# Patient Record
Sex: Male | Born: 1950 | Race: White | Hispanic: No | Marital: Married | Smoking: Former smoker
Health system: Southern US, Community
[De-identification: ages and names within clinical notes are randomized; demographics above are authoritative.]

## PROBLEM LIST (undated history)

## (undated) ENCOUNTER — Inpatient Hospital Stay: Admission: EM | Payer: Self-pay | Source: Home / Self Care

## (undated) DIAGNOSIS — G4700 Insomnia, unspecified: Secondary | ICD-10-CM

## (undated) DIAGNOSIS — I219 Acute myocardial infarction, unspecified: Secondary | ICD-10-CM

## (undated) DIAGNOSIS — Z8489 Family history of other specified conditions: Secondary | ICD-10-CM

## (undated) DIAGNOSIS — D61818 Other pancytopenia: Secondary | ICD-10-CM

## (undated) DIAGNOSIS — Z923 Personal history of irradiation: Secondary | ICD-10-CM

## (undated) DIAGNOSIS — N1330 Unspecified hydronephrosis: Secondary | ICD-10-CM

## (undated) DIAGNOSIS — S42009A Fracture of unspecified part of unspecified clavicle, initial encounter for closed fracture: Secondary | ICD-10-CM

## (undated) DIAGNOSIS — D649 Anemia, unspecified: Secondary | ICD-10-CM

## (undated) DIAGNOSIS — G4730 Sleep apnea, unspecified: Secondary | ICD-10-CM

## (undated) DIAGNOSIS — R011 Cardiac murmur, unspecified: Secondary | ICD-10-CM

## (undated) DIAGNOSIS — K219 Gastro-esophageal reflux disease without esophagitis: Secondary | ICD-10-CM

## (undated) DIAGNOSIS — C3490 Malignant neoplasm of unspecified part of unspecified bronchus or lung: Secondary | ICD-10-CM

## (undated) DIAGNOSIS — C801 Malignant (primary) neoplasm, unspecified: Secondary | ICD-10-CM

## (undated) DIAGNOSIS — Z960 Presence of urogenital implants: Secondary | ICD-10-CM

## (undated) DIAGNOSIS — E119 Type 2 diabetes mellitus without complications: Secondary | ICD-10-CM

## (undated) DIAGNOSIS — E538 Deficiency of other specified B group vitamins: Secondary | ICD-10-CM

## (undated) DIAGNOSIS — IMO0001 Reserved for inherently not codable concepts without codable children: Secondary | ICD-10-CM

## (undated) DIAGNOSIS — C7801 Secondary malignant neoplasm of right lung: Secondary | ICD-10-CM

## (undated) DIAGNOSIS — I2510 Atherosclerotic heart disease of native coronary artery without angina pectoris: Secondary | ICD-10-CM

## (undated) DIAGNOSIS — I10 Essential (primary) hypertension: Secondary | ICD-10-CM

## (undated) DIAGNOSIS — R310 Gross hematuria: Secondary | ICD-10-CM

## (undated) DIAGNOSIS — N200 Calculus of kidney: Secondary | ICD-10-CM

## (undated) DIAGNOSIS — C7800 Secondary malignant neoplasm of unspecified lung: Secondary | ICD-10-CM

## (undated) DIAGNOSIS — D62 Acute posthemorrhagic anemia: Secondary | ICD-10-CM

## (undated) DIAGNOSIS — Z828 Family history of other disabilities and chronic diseases leading to disablement, not elsewhere classified: Secondary | ICD-10-CM

## (undated) DIAGNOSIS — C20 Malignant neoplasm of rectum: Secondary | ICD-10-CM

## (undated) DIAGNOSIS — K625 Hemorrhage of anus and rectum: Secondary | ICD-10-CM

## (undated) HISTORY — DX: Family history of other disabilities and chronic diseases leading to disablement, not elsewhere classified: Z82.8

## (undated) HISTORY — DX: Anemia, unspecified: D64.9

## (undated) HISTORY — DX: Malignant neoplasm of unspecified part of unspecified bronchus or lung: C34.90

## (undated) HISTORY — PX: CHOLECYSTECTOMY: SHX55

## (undated) HISTORY — DX: Secondary malignant neoplasm of right lung: C78.01

## (undated) HISTORY — DX: Secondary malignant neoplasm of unspecified lung: C78.00

## (undated) HISTORY — DX: Presence of urogenital implants: Z96.0

## (undated) HISTORY — DX: Atherosclerotic heart disease of native coronary artery without angina pectoris: I25.10

## (undated) HISTORY — DX: Insomnia, unspecified: G47.00

## (undated) HISTORY — DX: Family history of other specified conditions: Z84.89

## (undated) HISTORY — DX: Personal history of irradiation: Z92.3

## (undated) HISTORY — DX: Deficiency of other specified B group vitamins: E53.8

## (undated) HISTORY — DX: Fracture of unspecified part of unspecified clavicle, initial encounter for closed fracture: S42.009A

## (undated) HISTORY — DX: Essential (primary) hypertension: I10

## (undated) HISTORY — DX: Calculus of kidney: N20.0

---

## 2000-05-30 HISTORY — PX: LITHOTRIPSY: SUR834

## 2001-05-30 HISTORY — PX: CORONARY ANGIOPLASTY WITH STENT PLACEMENT: SHX49

## 2001-07-19 ENCOUNTER — Inpatient Hospital Stay (HOSPITAL_COMMUNITY): Admission: AD | Admit: 2001-07-19 | Discharge: 2001-07-20 | Payer: Self-pay | Admitting: Cardiovascular Disease

## 2001-07-19 ENCOUNTER — Encounter: Payer: Self-pay | Admitting: Emergency Medicine

## 2002-02-07 ENCOUNTER — Encounter: Payer: Self-pay | Admitting: Nephrology

## 2002-02-07 ENCOUNTER — Ambulatory Visit (HOSPITAL_COMMUNITY): Admission: RE | Admit: 2002-02-07 | Discharge: 2002-02-07 | Payer: Self-pay | Admitting: Nephrology

## 2002-02-12 ENCOUNTER — Encounter: Payer: Self-pay | Admitting: Urology

## 2002-02-12 ENCOUNTER — Ambulatory Visit (HOSPITAL_COMMUNITY): Admission: RE | Admit: 2002-02-12 | Discharge: 2002-02-12 | Payer: Self-pay | Admitting: Urology

## 2002-02-18 ENCOUNTER — Ambulatory Visit (HOSPITAL_COMMUNITY): Admission: RE | Admit: 2002-02-18 | Discharge: 2002-02-18 | Payer: Self-pay | Admitting: Urology

## 2002-02-18 ENCOUNTER — Encounter: Payer: Self-pay | Admitting: Urology

## 2002-02-27 ENCOUNTER — Ambulatory Visit (HOSPITAL_COMMUNITY): Admission: RE | Admit: 2002-02-27 | Discharge: 2002-02-27 | Payer: Self-pay | Admitting: Urology

## 2002-02-27 ENCOUNTER — Encounter: Payer: Self-pay | Admitting: Urology

## 2002-03-01 ENCOUNTER — Encounter: Payer: Self-pay | Admitting: Urology

## 2002-03-01 ENCOUNTER — Ambulatory Visit (HOSPITAL_COMMUNITY): Admission: RE | Admit: 2002-03-01 | Discharge: 2002-03-01 | Payer: Self-pay | Admitting: Urology

## 2002-05-16 ENCOUNTER — Ambulatory Visit (HOSPITAL_COMMUNITY): Admission: RE | Admit: 2002-05-16 | Discharge: 2002-05-16 | Payer: Self-pay | Admitting: Urology

## 2002-05-16 ENCOUNTER — Encounter: Payer: Self-pay | Admitting: Urology

## 2003-10-14 ENCOUNTER — Ambulatory Visit (HOSPITAL_COMMUNITY): Admission: RE | Admit: 2003-10-14 | Discharge: 2003-10-14 | Payer: Self-pay | Admitting: Internal Medicine

## 2003-10-24 ENCOUNTER — Ambulatory Visit (HOSPITAL_COMMUNITY): Admission: RE | Admit: 2003-10-24 | Discharge: 2003-10-24 | Payer: Self-pay | Admitting: Urology

## 2003-12-03 ENCOUNTER — Ambulatory Visit (HOSPITAL_COMMUNITY): Admission: RE | Admit: 2003-12-03 | Discharge: 2003-12-03 | Payer: Self-pay | Admitting: Urology

## 2003-12-11 ENCOUNTER — Ambulatory Visit (HOSPITAL_COMMUNITY): Admission: RE | Admit: 2003-12-11 | Discharge: 2003-12-11 | Payer: Self-pay | Admitting: Urology

## 2004-01-09 ENCOUNTER — Ambulatory Visit (HOSPITAL_COMMUNITY): Admission: RE | Admit: 2004-01-09 | Discharge: 2004-01-09 | Payer: Self-pay | Admitting: Urology

## 2005-05-30 HISTORY — PX: CATARACT EXTRACTION W/ INTRAOCULAR LENS IMPLANT: SHX1309

## 2006-04-07 ENCOUNTER — Ambulatory Visit: Payer: Self-pay | Admitting: Internal Medicine

## 2006-04-11 ENCOUNTER — Ambulatory Visit (HOSPITAL_COMMUNITY): Admission: RE | Admit: 2006-04-11 | Discharge: 2006-04-11 | Payer: Self-pay | Admitting: Ophthalmology

## 2006-04-25 ENCOUNTER — Ambulatory Visit (HOSPITAL_COMMUNITY): Admission: RE | Admit: 2006-04-25 | Discharge: 2006-04-25 | Payer: Self-pay | Admitting: Ophthalmology

## 2006-05-18 ENCOUNTER — Encounter (INDEPENDENT_AMBULATORY_CARE_PROVIDER_SITE_OTHER): Payer: Self-pay | Admitting: Specialist

## 2006-05-18 ENCOUNTER — Ambulatory Visit (HOSPITAL_COMMUNITY): Admission: RE | Admit: 2006-05-18 | Discharge: 2006-05-18 | Payer: Self-pay | Admitting: General Surgery

## 2006-05-30 HISTORY — PX: COLON RESECTION: SHX5231

## 2007-03-14 ENCOUNTER — Ambulatory Visit: Payer: Self-pay | Admitting: Internal Medicine

## 2007-03-14 ENCOUNTER — Encounter (HOSPITAL_COMMUNITY): Admission: RE | Admit: 2007-03-14 | Discharge: 2007-04-13 | Payer: Self-pay | Admitting: Oncology

## 2007-03-14 ENCOUNTER — Encounter: Payer: Self-pay | Admitting: Internal Medicine

## 2007-03-14 ENCOUNTER — Ambulatory Visit (HOSPITAL_COMMUNITY): Payer: Self-pay | Admitting: Oncology

## 2007-03-14 ENCOUNTER — Ambulatory Visit (HOSPITAL_COMMUNITY): Admission: RE | Admit: 2007-03-14 | Discharge: 2007-03-14 | Payer: Self-pay | Admitting: Internal Medicine

## 2007-03-15 ENCOUNTER — Encounter (HOSPITAL_COMMUNITY): Admission: RE | Admit: 2007-03-15 | Discharge: 2007-04-14 | Payer: Self-pay | Admitting: Internal Medicine

## 2007-03-16 ENCOUNTER — Encounter: Payer: Self-pay | Admitting: Internal Medicine

## 2007-03-16 ENCOUNTER — Ambulatory Visit: Payer: Self-pay | Admitting: Internal Medicine

## 2007-03-16 ENCOUNTER — Ambulatory Visit (HOSPITAL_COMMUNITY): Admission: RE | Admit: 2007-03-16 | Discharge: 2007-03-16 | Payer: Self-pay | Admitting: Internal Medicine

## 2007-03-19 ENCOUNTER — Ambulatory Visit (HOSPITAL_COMMUNITY): Admission: RE | Admit: 2007-03-19 | Discharge: 2007-03-19 | Payer: Self-pay | Admitting: Internal Medicine

## 2007-03-29 ENCOUNTER — Ambulatory Visit: Payer: Self-pay | Admitting: Cardiology

## 2007-04-13 ENCOUNTER — Encounter (INDEPENDENT_AMBULATORY_CARE_PROVIDER_SITE_OTHER): Payer: Self-pay | Admitting: General Surgery

## 2007-04-13 ENCOUNTER — Inpatient Hospital Stay (HOSPITAL_COMMUNITY): Admission: RE | Admit: 2007-04-13 | Discharge: 2007-05-02 | Payer: Self-pay | Admitting: General Surgery

## 2007-04-22 ENCOUNTER — Encounter (INDEPENDENT_AMBULATORY_CARE_PROVIDER_SITE_OTHER): Payer: Self-pay | Admitting: General Surgery

## 2007-05-10 ENCOUNTER — Ambulatory Visit (HOSPITAL_COMMUNITY): Admission: RE | Admit: 2007-05-10 | Discharge: 2007-05-10 | Payer: Self-pay | Admitting: General Surgery

## 2007-05-29 ENCOUNTER — Ambulatory Visit (HOSPITAL_COMMUNITY): Payer: Self-pay | Admitting: Oncology

## 2007-05-29 ENCOUNTER — Encounter (HOSPITAL_COMMUNITY): Admission: RE | Admit: 2007-05-29 | Discharge: 2007-05-30 | Payer: Self-pay | Admitting: Oncology

## 2007-06-06 ENCOUNTER — Ambulatory Visit (HOSPITAL_COMMUNITY): Admission: RE | Admit: 2007-06-06 | Discharge: 2007-06-06 | Payer: Self-pay | Admitting: General Surgery

## 2007-06-08 ENCOUNTER — Ambulatory Visit: Admission: RE | Admit: 2007-06-08 | Discharge: 2007-08-23 | Payer: Self-pay | Admitting: Radiation Oncology

## 2007-06-26 ENCOUNTER — Encounter (HOSPITAL_COMMUNITY): Admission: RE | Admit: 2007-06-26 | Discharge: 2007-07-26 | Payer: Self-pay | Admitting: Oncology

## 2007-07-24 ENCOUNTER — Ambulatory Visit (HOSPITAL_COMMUNITY): Payer: Self-pay | Admitting: Oncology

## 2007-08-22 ENCOUNTER — Encounter (HOSPITAL_COMMUNITY): Admission: RE | Admit: 2007-08-22 | Discharge: 2007-09-21 | Payer: Self-pay | Admitting: Oncology

## 2007-08-22 ENCOUNTER — Ambulatory Visit (HOSPITAL_COMMUNITY): Payer: Self-pay | Admitting: Oncology

## 2007-09-28 ENCOUNTER — Encounter (HOSPITAL_COMMUNITY): Admission: RE | Admit: 2007-09-28 | Discharge: 2007-10-28 | Payer: Self-pay | Admitting: Oncology

## 2007-10-08 ENCOUNTER — Ambulatory Visit (HOSPITAL_COMMUNITY): Payer: Self-pay | Admitting: Oncology

## 2007-11-01 ENCOUNTER — Encounter (HOSPITAL_COMMUNITY): Admission: RE | Admit: 2007-11-01 | Discharge: 2007-12-01 | Payer: Self-pay | Admitting: Oncology

## 2007-11-26 ENCOUNTER — Ambulatory Visit (HOSPITAL_COMMUNITY): Payer: Self-pay | Admitting: Oncology

## 2007-12-24 ENCOUNTER — Encounter (HOSPITAL_COMMUNITY): Admission: RE | Admit: 2007-12-24 | Discharge: 2008-01-23 | Payer: Self-pay | Admitting: Oncology

## 2008-01-14 ENCOUNTER — Ambulatory Visit (HOSPITAL_COMMUNITY): Payer: Self-pay | Admitting: Oncology

## 2008-02-22 ENCOUNTER — Encounter (HOSPITAL_COMMUNITY): Admission: RE | Admit: 2008-02-22 | Discharge: 2008-03-23 | Payer: Self-pay | Admitting: Oncology

## 2008-03-04 ENCOUNTER — Ambulatory Visit: Payer: Self-pay | Admitting: Gastroenterology

## 2008-03-06 ENCOUNTER — Ambulatory Visit (HOSPITAL_COMMUNITY): Admission: RE | Admit: 2008-03-06 | Discharge: 2008-03-06 | Payer: Self-pay | Admitting: Internal Medicine

## 2008-03-13 ENCOUNTER — Ambulatory Visit (HOSPITAL_COMMUNITY): Admission: RE | Admit: 2008-03-13 | Discharge: 2008-03-13 | Payer: Self-pay | Admitting: Internal Medicine

## 2008-03-25 ENCOUNTER — Ambulatory Visit (HOSPITAL_COMMUNITY): Admission: RE | Admit: 2008-03-25 | Discharge: 2008-03-25 | Payer: Self-pay | Admitting: Internal Medicine

## 2008-03-25 ENCOUNTER — Encounter: Payer: Self-pay | Admitting: Internal Medicine

## 2008-03-25 ENCOUNTER — Ambulatory Visit: Payer: Self-pay | Admitting: Internal Medicine

## 2008-03-28 ENCOUNTER — Ambulatory Visit (HOSPITAL_COMMUNITY): Payer: Self-pay | Admitting: Oncology

## 2008-05-09 ENCOUNTER — Encounter (HOSPITAL_COMMUNITY): Admission: RE | Admit: 2008-05-09 | Discharge: 2008-06-08 | Payer: Self-pay | Admitting: Oncology

## 2008-06-20 ENCOUNTER — Ambulatory Visit (HOSPITAL_COMMUNITY): Payer: Self-pay | Admitting: Oncology

## 2008-07-25 ENCOUNTER — Encounter (HOSPITAL_COMMUNITY): Admission: RE | Admit: 2008-07-25 | Discharge: 2008-08-24 | Payer: Self-pay | Admitting: Oncology

## 2008-08-22 ENCOUNTER — Ambulatory Visit (HOSPITAL_COMMUNITY): Payer: Self-pay | Admitting: Oncology

## 2008-08-27 ENCOUNTER — Encounter: Payer: Self-pay | Admitting: Internal Medicine

## 2008-09-03 ENCOUNTER — Encounter (HOSPITAL_COMMUNITY): Admission: RE | Admit: 2008-09-03 | Discharge: 2008-10-03 | Payer: Self-pay | Admitting: Oncology

## 2008-10-15 ENCOUNTER — Ambulatory Visit (HOSPITAL_COMMUNITY): Payer: Self-pay | Admitting: Oncology

## 2008-11-17 ENCOUNTER — Encounter (HOSPITAL_COMMUNITY): Admission: RE | Admit: 2008-11-17 | Discharge: 2008-12-17 | Payer: Self-pay | Admitting: Oncology

## 2008-12-15 ENCOUNTER — Ambulatory Visit (HOSPITAL_COMMUNITY): Payer: Self-pay | Admitting: Oncology

## 2009-02-03 ENCOUNTER — Encounter (HOSPITAL_COMMUNITY): Admission: RE | Admit: 2009-02-03 | Discharge: 2009-02-25 | Payer: Self-pay | Admitting: Oncology

## 2009-02-03 ENCOUNTER — Ambulatory Visit (HOSPITAL_COMMUNITY): Payer: Self-pay | Admitting: Oncology

## 2009-02-18 ENCOUNTER — Encounter: Payer: Self-pay | Admitting: Internal Medicine

## 2009-03-31 ENCOUNTER — Ambulatory Visit (HOSPITAL_COMMUNITY): Payer: Self-pay | Admitting: Oncology

## 2009-05-11 ENCOUNTER — Encounter (HOSPITAL_COMMUNITY): Admission: RE | Admit: 2009-05-11 | Discharge: 2009-05-27 | Payer: Self-pay | Admitting: Oncology

## 2009-06-08 ENCOUNTER — Ambulatory Visit (HOSPITAL_COMMUNITY): Payer: Self-pay | Admitting: Oncology

## 2009-08-03 ENCOUNTER — Ambulatory Visit (HOSPITAL_COMMUNITY): Payer: Self-pay | Admitting: Oncology

## 2009-08-03 ENCOUNTER — Encounter (HOSPITAL_COMMUNITY): Admission: RE | Admit: 2009-08-03 | Discharge: 2009-09-02 | Payer: Self-pay | Admitting: Oncology

## 2009-08-18 ENCOUNTER — Encounter: Payer: Self-pay | Admitting: Internal Medicine

## 2009-09-28 ENCOUNTER — Ambulatory Visit (HOSPITAL_COMMUNITY): Payer: Self-pay | Admitting: Oncology

## 2009-11-02 ENCOUNTER — Encounter (HOSPITAL_COMMUNITY): Admission: RE | Admit: 2009-11-02 | Discharge: 2009-12-02 | Payer: Self-pay | Admitting: Oncology

## 2009-12-07 ENCOUNTER — Ambulatory Visit (HOSPITAL_COMMUNITY): Payer: Self-pay | Admitting: Oncology

## 2010-01-08 ENCOUNTER — Encounter (HOSPITAL_COMMUNITY): Admission: RE | Admit: 2010-01-08 | Discharge: 2010-02-07 | Payer: Self-pay | Admitting: Oncology

## 2010-02-02 ENCOUNTER — Ambulatory Visit (HOSPITAL_COMMUNITY): Payer: Self-pay | Admitting: Oncology

## 2010-02-10 ENCOUNTER — Encounter: Payer: Self-pay | Admitting: Internal Medicine

## 2010-03-29 ENCOUNTER — Encounter (HOSPITAL_COMMUNITY)
Admission: RE | Admit: 2010-03-29 | Discharge: 2010-04-28 | Payer: Self-pay | Source: Home / Self Care | Admitting: Oncology

## 2010-03-29 ENCOUNTER — Ambulatory Visit (HOSPITAL_COMMUNITY): Payer: Self-pay | Admitting: Oncology

## 2010-05-03 ENCOUNTER — Encounter (HOSPITAL_COMMUNITY)
Admission: RE | Admit: 2010-05-03 | Discharge: 2010-06-02 | Payer: Self-pay | Source: Home / Self Care | Attending: Oncology | Admitting: Oncology

## 2010-06-14 ENCOUNTER — Ambulatory Visit (HOSPITAL_COMMUNITY)
Admission: RE | Admit: 2010-06-14 | Discharge: 2010-06-29 | Payer: Self-pay | Source: Home / Self Care | Attending: Oncology | Admitting: Oncology

## 2010-06-14 ENCOUNTER — Encounter (HOSPITAL_COMMUNITY)
Admission: RE | Admit: 2010-06-14 | Discharge: 2010-06-29 | Payer: Self-pay | Source: Home / Self Care | Attending: Oncology | Admitting: Oncology

## 2010-06-16 LAB — CBC
HCT: 38.7 % — ABNORMAL LOW (ref 39.0–52.0)
Hemoglobin: 13.1 g/dL (ref 13.0–17.0)
MCH: 28.9 pg (ref 26.0–34.0)
MCHC: 33.9 g/dL (ref 30.0–36.0)
MCV: 85.4 fL (ref 78.0–100.0)
Platelets: 80 10*3/uL — ABNORMAL LOW (ref 150–400)
RBC: 4.53 MIL/uL (ref 4.22–5.81)
RDW: 16.1 % — ABNORMAL HIGH (ref 11.5–15.5)
WBC: 4.6 10*3/uL (ref 4.0–10.5)

## 2010-06-16 LAB — DIFFERENTIAL
Basophils Absolute: 0 10*3/uL (ref 0.0–0.1)
Basophils Relative: 0 % (ref 0–1)
Eosinophils Absolute: 0.1 10*3/uL (ref 0.0–0.7)
Eosinophils Relative: 2 % (ref 0–5)
Lymphocytes Relative: 12 % (ref 12–46)
Lymphs Abs: 0.6 10*3/uL — ABNORMAL LOW (ref 0.7–4.0)
Monocytes Absolute: 0.3 10*3/uL (ref 0.1–1.0)
Monocytes Relative: 7 % (ref 3–12)
Neutro Abs: 3.6 10*3/uL (ref 1.7–7.7)
Neutrophils Relative %: 79 % — ABNORMAL HIGH (ref 43–77)

## 2010-06-16 LAB — COMPREHENSIVE METABOLIC PANEL
ALT: 18 U/L (ref 0–53)
AST: 23 U/L (ref 0–37)
Albumin: 3.8 g/dL (ref 3.5–5.2)
Alkaline Phosphatase: 60 U/L (ref 39–117)
BUN: 9 mg/dL (ref 6–23)
CO2: 28 mEq/L (ref 19–32)
Calcium: 9.4 mg/dL (ref 8.4–10.5)
Chloride: 103 mEq/L (ref 96–112)
Creatinine, Ser: 1.36 mg/dL (ref 0.4–1.5)
GFR calc Af Amer: >60 mL/min (ref >60)
GFR calc non Af Amer: 54 mL/min — ABNORMAL LOW (ref >60)
Glucose, Bld: 113 mg/dL — ABNORMAL HIGH (ref 70–99)
Potassium: 3.8 mEq/L (ref 3.5–5.1)
Sodium: 140 mEq/L (ref 135–145)
Total Bilirubin: 0.6 mg/dL (ref 0.3–1.2)
Total Protein: 7.2 g/dL (ref 6.0–8.3)

## 2010-06-16 LAB — CEA: CEA: 1.9 ng/mL (ref 0.0–5.0)

## 2010-06-20 ENCOUNTER — Encounter: Payer: Self-pay | Admitting: General Surgery

## 2010-06-29 NOTE — Letter (Signed)
Summary: [facility]  [facility]   Imported By: Rexene Alberts 02/10/2010 16:03:41  _____________________________________________________________________  External Attachment:    Type:   Image     Comment:   External Document

## 2010-06-29 NOTE — Letter (Signed)
Summary: [facility]  [facility]   Imported By: Diana Eves 08/18/2009 10:41:03  _____________________________________________________________________  External Attachment:    Type:   Image     Comment:   External Document

## 2010-07-07 DIAGNOSIS — E7800 Pure hypercholesterolemia, unspecified: Secondary | ICD-10-CM | POA: Insufficient documentation

## 2010-07-07 DIAGNOSIS — C20 Malignant neoplasm of rectum: Secondary | ICD-10-CM

## 2010-07-07 DIAGNOSIS — E119 Type 2 diabetes mellitus without complications: Secondary | ICD-10-CM | POA: Insufficient documentation

## 2010-07-07 DIAGNOSIS — I2510 Atherosclerotic heart disease of native coronary artery without angina pectoris: Secondary | ICD-10-CM | POA: Insufficient documentation

## 2010-07-07 DIAGNOSIS — G4730 Sleep apnea, unspecified: Secondary | ICD-10-CM | POA: Insufficient documentation

## 2010-07-07 DIAGNOSIS — D539 Nutritional anemia, unspecified: Secondary | ICD-10-CM | POA: Insufficient documentation

## 2010-07-07 HISTORY — DX: Malignant neoplasm of rectum: C20

## 2010-07-12 ENCOUNTER — Encounter: Payer: Self-pay | Admitting: Urgent Care

## 2010-07-12 ENCOUNTER — Other Ambulatory Visit: Payer: Self-pay | Admitting: Internal Medicine

## 2010-07-12 ENCOUNTER — Other Ambulatory Visit (HOSPITAL_COMMUNITY): Payer: Managed Care, Other (non HMO)

## 2010-07-12 ENCOUNTER — Ambulatory Visit (INDEPENDENT_AMBULATORY_CARE_PROVIDER_SITE_OTHER): Payer: Managed Care, Other (non HMO) | Admitting: Urgent Care

## 2010-07-12 DIAGNOSIS — E538 Deficiency of other specified B group vitamins: Secondary | ICD-10-CM

## 2010-07-12 DIAGNOSIS — C189 Malignant neoplasm of colon, unspecified: Secondary | ICD-10-CM

## 2010-07-12 DIAGNOSIS — R112 Nausea with vomiting, unspecified: Secondary | ICD-10-CM | POA: Insufficient documentation

## 2010-07-12 DIAGNOSIS — R109 Unspecified abdominal pain: Secondary | ICD-10-CM

## 2010-07-12 DIAGNOSIS — R197 Diarrhea, unspecified: Secondary | ICD-10-CM | POA: Insufficient documentation

## 2010-07-12 DIAGNOSIS — C20 Malignant neoplasm of rectum: Secondary | ICD-10-CM

## 2010-07-12 LAB — CONVERTED CEMR LAB: TSH: 1.262 microintl units/mL (ref 0.350–4.500)

## 2010-07-13 ENCOUNTER — Ambulatory Visit (HOSPITAL_COMMUNITY)
Admission: RE | Admit: 2010-07-13 | Discharge: 2010-07-13 | Disposition: A | Discharge status: Home / Self Care | Payer: Managed Care, Other (non HMO) | Source: Ambulatory Visit | Attending: Internal Medicine | Admitting: Internal Medicine

## 2010-07-13 ENCOUNTER — Encounter (HOSPITAL_COMMUNITY): Payer: Self-pay

## 2010-07-13 DIAGNOSIS — Q619 Cystic kidney disease, unspecified: Secondary | ICD-10-CM | POA: Insufficient documentation

## 2010-07-13 DIAGNOSIS — R933 Abnormal findings on diagnostic imaging of other parts of digestive tract: Secondary | ICD-10-CM | POA: Insufficient documentation

## 2010-07-13 DIAGNOSIS — Z85038 Personal history of other malignant neoplasm of large intestine: Secondary | ICD-10-CM | POA: Insufficient documentation

## 2010-07-13 DIAGNOSIS — N200 Calculus of kidney: Secondary | ICD-10-CM | POA: Insufficient documentation

## 2010-07-13 DIAGNOSIS — R109 Unspecified abdominal pain: Secondary | ICD-10-CM | POA: Insufficient documentation

## 2010-07-13 DIAGNOSIS — Z9049 Acquired absence of other specified parts of digestive tract: Secondary | ICD-10-CM | POA: Insufficient documentation

## 2010-07-13 DIAGNOSIS — R197 Diarrhea, unspecified: Secondary | ICD-10-CM | POA: Insufficient documentation

## 2010-07-13 HISTORY — DX: Malignant (primary) neoplasm, unspecified: C80.1

## 2010-07-13 LAB — CONVERTED CEMR LAB
ALT: 37 units/L (ref 0–53)
AST: 30 units/L (ref 0–37)
Albumin: 4 g/dL (ref 3.5–5.2)
Basophils Absolute: 0 10*3/uL (ref 0.0–0.1)
Basophils Relative: 0 % (ref 0–1)
Chloride: 105 meq/L (ref 96–112)
Eosinophils Absolute: 0.1 10*3/uL (ref 0.0–0.7)
Eosinophils Relative: 1 % (ref 0–5)
HCT: 42.5 % (ref 39.0–52.0)
Lymphs Abs: 0.8 10*3/uL (ref 0.7–4.0)
MCV: 86.4 fL (ref 78.0–100.0)
Monocytes Relative: 7 % (ref 3–12)
Neutrophils Relative %: 82 % — ABNORMAL HIGH (ref 43–77)
Platelets: 141 10*3/uL — ABNORMAL LOW (ref 150–400)
RBC: 4.92 M/uL (ref 4.22–5.81)
Total Bilirubin: 1 mg/dL (ref 0.3–1.2)
Total Protein: 7.2 g/dL (ref 6.0–8.3)

## 2010-07-15 ENCOUNTER — Encounter (INDEPENDENT_AMBULATORY_CARE_PROVIDER_SITE_OTHER): Payer: Self-pay

## 2010-07-15 ENCOUNTER — Telehealth (INDEPENDENT_AMBULATORY_CARE_PROVIDER_SITE_OTHER): Payer: Self-pay

## 2010-07-15 ENCOUNTER — Encounter: Payer: Self-pay | Admitting: Internal Medicine

## 2010-07-20 ENCOUNTER — Encounter: Payer: Self-pay | Admitting: Internal Medicine

## 2010-07-21 NOTE — Miscellaneous (Signed)
Summary: Orders Update  Clinical Lists Changes  Orders: Added new Test order of T-Culture, C-Diff Toxin A/B [phone]) - Signed Added new Test order of T-Culture, Stool ([id number]) - Signed Added new Test order of T-Fecal WBC ([id number]) - Signed

## 2010-07-21 NOTE — Assessment & Plan Note (Signed)
Summary: stomach pains,diarrhea   Vital Signs:  Patient profile:   60 year old male Height:      70 inches Weight:      222 pounds BMI:     31.97 Temp:     98.0 degrees F oral Pulse rate:   72 / minute BP sitting:   90 / 68  (left arm) Cuff size:   regular  Vitals Entered By: Cloria Spring LPN (July 12, 2010 11:33 AM)  Visit Type:  Follow-up Visit Primary Care Provider:  Dr. Phillips Odor  Chief Complaint:  abd pain/diarrhea.  History of Present Illness: cc: Dr [name] (oncologist)  60 y/o caucasian male w/ diarrhea worse x 1 mo.  Persistent x at least 3 months.  Hx rectal CA s/p low ant resection & subtotal colectomy back in 03/2007. s/p radiation & chemo.   Was doing very well.  Weight stable.  Appetite ok.  c/o loose "like dish water" stools q x 8-10 per day.  c/o lots of gurgling.  c/o nausea & vomiting on 2 occasions, one was Thanksgiving, esp after he eats a lot.  Feels better w/ vomiting.  c/o intermittant mid-abd "knot-like pain" that originates in RUQ, lasts minutes.    Denies heartburn, indigestion, dysphagia, or odynophagia.  Denies rectal bleeding or melena.  Denies weight loss or anorexia.   Denies fever or chills.       Current Medications (verified): 1)  Aspir-Low 81 Mg Tbec (Aspirin) 2)  Metoprolol-Hydrochlorothiazide 50-25 Mg Tabs (Metoprolol-Hydrochlorothiazide) .... One Tablet Daily 3)  Daily Multiple Vitamins  Tabs (Multiple Vitamin) .... One Tablet Daily 4)  Lisinopril 10 Mg Tabs (Lisinopril) .... One Tablet Daily 5)  Simvastatin 40 Mg Tabs (Simvastatin) .... 1/2 Tablet Daily 6)  B12 Injection .... Once Monthly  Allergies (verified): 1)  ! 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  Illicit Drug Use - no Smoking Status:  quit Drug Use:  no  Review of Systems      See HPI General:  Denies fever, chills, sweats, anorexia, fatigue, weakness, malaise, weight loss, and sleep disorder. CV:  Denies chest pains, angina, palpitations, syncope, dyspnea on exertion, orthopnea, PND, peripheral edema, and claudication. Resp:  Denies dyspnea at rest, dyspnea with exercise, cough, sputum, wheezing, coughing up blood, and pleurisy. GI:  See HPI; Denies difficulty swallowing, pain on swallowing, and jaundice. GU:  Denies urinary burning, blood in urine, urinary frequency, urinary hesitancy, nocturnal urination, and urinary incontinence. MS:  Denies joint pain / LOM, joint swelling, joint stiffness, joint deformity, low back pain, muscle weakness, muscle cramps, muscle atrophy, leg pain at night, leg pain with exertion, and shoulder pain / LOM hand / wrist pain (CTS). Derm:  Denies rash, itching, dry skin, hives, moles, warts, and unhealing ulcers. Psych:  Denies depression, anxiety, memory loss, suicidal ideation, hallucinations, paranoia, phobia, and confusion. Heme:  Denies bruising, bleeding, and enlarged lymph nodes.  Physical Exam  General:  Well developed, well nourished, no acute distress. Head:  Normocephalic and atraumatic. Eyes:  Sclera clear, no  icterus. Ears:  Normal auditory acuity. Mouth:  No deformity or lesions, dentition normal. Neck:  Supple; no masses or thyromegaly. Chest Wall:  Left ant PAC site clear Lungs:  Clear throughout to auscultation. Heart:  Regular rate and rhythm; no murmurs, rubs,  or bruits. Abdomen:  Soft, nontender and nondistended. No masses,  hepatosplenomegaly or hernias noted. Normal bowel sounds.without guarding and without rebound.   Msk:  Symmetrical with no gross deformities. Normal posture. Pulses:  Normal pulses noted. Extremities:  No clubbing, cyanosis, edema or deformities noted. Neurologic:  Alert and  oriented x4;  grossly normal neurologically. Skin:  Intact without significant lesions or rashes. Cervical Nodes:  No significant cervical adenopathy. Psych:  Alert and cooperative. Normal mood and affect.   Impression & Recommendations:  Problem # 1:  DIARRHEA (ICD-65.91) 60 y/o caucasian male w/ hx rectal CA who presents w/ abd pain, diarrhea, N/V x 12mo, worse over past month.  Differentials include recurrent colorectal CA, intermittant obstruction/ileus, post-surgical diarrhea, doubt infectious colitis given duration of symptoms.  He warrants further evaluation. CT abd/pelvis w/ IV/oral contrast tomorrow Orders: T-CBC w/Diff [phone]) T-Hepatic Function [phone]) T-Basic Metabolic Panel [phone]) T-TSH [phone]) Est. Patient Level IV (28413)  Problem # 2:  NAUSEA AND VOMITING (ICD-787.01) See #1  Problem # 3:  ABDOMINAL PAIN (ICD-789.00) See #1  Problem # 4:  Hx of CARCINOMA, RECTUM (ICD-154.1) See #1  Patient Instructions: 1)  Go get labs today 2)  Ct tomorrow 3)  To Er if severe pain, N/V/D   Orders Added: 1)  T-CBC w/Diff [[id number]] 2)  T-Hepatic Function [[id number]] 3)  T-Basic Metabolic Panel [[id number]] 4)  T-TSH [[id number]] 5)  Est. Patient Level IV [25956]

## 2010-07-21 NOTE — Miscellaneous (Addendum)
Summary: 03/25/2008 tcs and path report  Clinical Lists Changes NAME:  [name], [name]               ACCOUNT NO.:  [id number]      MEDICAL RECORD NO.:  [id number]          PATIENT TYPE:  AMB      LOCATION:  DAY                           FACILITY:  [facility]      PHYSICIAN:  R. Roetta Sessions, M.D. DATE OF BIRTH:  09/21/1950      DATE OF PROCEDURE:  03/25/2008   DATE OF DISCHARGE:                                  OPERATIVE REPORT     PROCEDURE:  Sigmoidoscopy with biopsy.      INDICATIONS FOR PROCEDURE:  The patient is a pleasant 60 year old   gentleman who underwent low anterior resection for a proximal rectal   cancer last year.  Unfortunately, the initial resection did not include   the rectal tumor.  Postoperative course was complicated by anastomotic   leak for which a second surgery was performed, this removed the rectal   carcinoma.      He had been seen by Dr. Mariel [name].  He has been on chemotherapy, but he   has developed significant thrombocytopenia which was complicated by   chemotherapy.  Dr. Mariel [name] has data quantifying an enlargement of his   spleen over time.  We saw him in consultation recently and performed an   MRI of the abdomen.  Dr. Eppie [name] interpreted his liver and spleen to   appear completely normal without any evidence of metastatic disease.   [name] has really done well clinically from the standpoint of his   surgery.  Certainly, he does have a diarrhea now days and takes Lomotil   and Imodium p.r.n. with fairly good results.  Of note, he does have   cholelithiasis on prior MRI.  He is here for his surveillance   examination of his residual lower GI tract.  This approach has discussed   with the patient at length.  Risks, benefits, and alternatives have been   reviewed.  Please see the documentation in the medical record.      PROCEDURE NOTE:  O2 saturation, blood pressure, pulse of the patient   monitored throughout the entire procedure.  Conscious sedation,  Versed 4   mg IV Demerol 100 g IV in divided doses.      INSTRUMENTATION:  Pentax video chip system.      FINDINGS:  Digital rectal exam revealed no abnormalities.  Endoscopic   findings, the prep was adequate.  Examination of the distal rectum   revealed a surgical anastomosis with small bowel at 12 cm from the anal   verge.      This anastomosis was observed to be fairly close to the location of the   tumor, found last year, suture material was protruding from the   anastomosis.  Anastomosis was patent.  I was able to go up to 15-20 cm   in the small bowel, which appeared normal.  I pulled the scope back to   examine the anastomosis carefully, and the tissue around the anastomosis   was somewhat friable and a little bumpy/ adenomatous appearing  between   two areas of suture which may just be postoperative fibrotic changes.   This area was biopsied for the pathologist.  From the anastomosis   distally, the residual rectal mucosa appeared entirely normal.  The   rectal vault was too small to retroflex, but for the same reason I was   able to see the residual rectal mucosa very well and found that as   stated.  The patient tolerated the procedure well and was reactive in   endoscopy.      IMPRESSION:   1. Status post a colectomy with 12 cm of residual rectum, remaining       surgical anastomosis was identified.  It was patent, distal small       bowel appeared more normal.   2. Subtle mucosal layer irregularity at the anastomosis which may be a       normal variant approximately 1 year postoperatively status post       biopsy.      RECOMMENDATIONS:   1. Continue Lomotil/Imodium p.r.n. diarrhea.   2. Follow-up on path.   3. As far as his liver and spleen are concerned, on MRI, Dr. Eppie [name]       interpreted the organs to be normal in appearance.  We would like       to review this study with him as soon as that can be arranged.  MRI       findings will be reassuring if the spleen has  enlarged.  He could       have had some degree of splenic sequestration to account for his       diminished platelet count.   4. Further recommendations to follow.                  [name], M.D.   Electronically Signed            RMR/MEDQ  D:  03/25/2008  T:  03/25/2008  Job:  [id number]      cc:   [name]. [name] Sleet, [name]   Fax: [phone]      [name], M.D.   Fax: [phone]      SP-Surgical Pathology - STATUS: Final  .                                         Perform Date: 27Oct09 00:01  Ordered By: [name] , [name]           Ordered Date: 27Oct09 13:52  Facility: [facility]                               Department: CPATH  Service Report Text  [facility]   [address] [place], [place] [address]   [phone]    REPORT OF SURGICAL PATHOLOGY    Case #: [id number]   Patient Name: [name], [name].   Office Chart Number: N/A    MRN: [id number]   Pathologist: H. Hollice Espy, [name]   DOB/Age [birth year] (Age: 30) Gender: M   Date Taken: 03/25/2008   Date Received: 03/25/2008    FINAL DIAGNOSIS    ***MICROSCOPIC EXAMINATION AND DIAGNOSIS***    ANASTOMOSIS SITE, BIOPSY: COLONIC AND SMALL BOWEL MUCOSA WITH   ASSOCIATED ACUTE INFLAMMATION AND SURFACE EROSIONS, NO EVIDENCE  OF VIRAL CYTOPATHIC CHANGES, ADENOMATOUS CHANGES, DYSPLASIA, OR   MALIGNANCY IDENTIFIED.    gdt   Date Reported: 03/26/2008 H. Hollice Espy, [name]   *** Electronically Signed Out By [facility] ***    Clinical information   History of colon ca (km)    specimen(s) obtained   Colon, biopsy, anastamosis    Gross Description   Received in formalin are tan, soft tissue fragments that are   submitted in toto. Number: Three   Size: 0.1 to 0.2 cm (SW:mj 03/25/08)    mj/   Additional Information  HL7 RESULT STATUS : F  External IF Update Timestamp : 2008-03-25:13:55:00.000000

## 2010-07-21 NOTE — Progress Notes (Addendum)
Summary: orders for stool samples  ---- Converted from flag ---- ---- 07/14/2010 3:53 PM, R. Roetta Sessions MD, Caleen Essex wrote: when was last tcs/fs? need stool spl for cdiff, lactoferrin and culture asap ------------------------------  Appended Document: Orders Update 02/2008 FS->Status post a colectomy with 12 cm of residual rectum, remaining surgical anastomosis was identified.  It was patent, distal small bowel appeared more normal. Subtle mucosal layer irregularity at the anastomosis which may be a normal variant approximately 1 year postoperatively status post biopsy.  ANASTOMOSIS SITE, BIOPSY: COLONIC AND SMALL BOWEL MUCOSA WITH   ASSOCIATED ACUTE INFLAMMATION AND SURFACE EROSIONS, NO EVIDENCE   OF VIRAL CYTOPATHIC CHANGES, ADENOMATOUS CHANGES, DYSPLASIA, OR   MALIGNANCY IDENTIFIED    Clinical Lists Changes       Appended Document: orders for stool samples see CT report

## 2010-07-27 ENCOUNTER — Other Ambulatory Visit: Payer: Self-pay | Admitting: Internal Medicine

## 2010-07-27 ENCOUNTER — Other Ambulatory Visit: Payer: Managed Care, Other (non HMO) | Admitting: Internal Medicine

## 2010-07-27 ENCOUNTER — Ambulatory Visit (HOSPITAL_COMMUNITY)
Admission: RE | Admit: 2010-07-27 | Discharge: 2010-07-27 | Disposition: A | Discharge status: Home / Self Care | Payer: Managed Care, Other (non HMO) | Source: Ambulatory Visit | Attending: Internal Medicine | Admitting: Internal Medicine

## 2010-07-27 DIAGNOSIS — E785 Hyperlipidemia, unspecified: Secondary | ICD-10-CM | POA: Insufficient documentation

## 2010-07-27 DIAGNOSIS — Z9049 Acquired absence of other specified parts of digestive tract: Secondary | ICD-10-CM | POA: Insufficient documentation

## 2010-07-27 DIAGNOSIS — Z7982 Long term (current) use of aspirin: Secondary | ICD-10-CM | POA: Insufficient documentation

## 2010-07-27 DIAGNOSIS — I10 Essential (primary) hypertension: Secondary | ICD-10-CM | POA: Insufficient documentation

## 2010-07-27 DIAGNOSIS — R197 Diarrhea, unspecified: Secondary | ICD-10-CM | POA: Insufficient documentation

## 2010-07-27 DIAGNOSIS — E119 Type 2 diabetes mellitus without complications: Secondary | ICD-10-CM | POA: Insufficient documentation

## 2010-07-27 DIAGNOSIS — R933 Abnormal findings on diagnostic imaging of other parts of digestive tract: Secondary | ICD-10-CM | POA: Insufficient documentation

## 2010-07-27 DIAGNOSIS — Z79899 Other long term (current) drug therapy: Secondary | ICD-10-CM | POA: Insufficient documentation

## 2010-07-27 LAB — GLUCOSE, CAPILLARY: Glucose-Capillary: 75 mg/dL (ref 70–99)

## 2010-07-27 NOTE — Letter (Signed)
Summary: FLEX SIG ORDER  FLEX SIG ORDER   Imported By: Ave Filter 07/20/2010 10:44:19  _____________________________________________________________________  External Attachment:    Type:   Image     Comment:   External Document

## 2010-08-02 ENCOUNTER — Ambulatory Visit (HOSPITAL_COMMUNITY): Payer: Managed Care, Other (non HMO) | Admitting: Oncology

## 2010-08-02 DIAGNOSIS — C20 Malignant neoplasm of rectum: Secondary | ICD-10-CM

## 2010-08-04 NOTE — Op Note (Addendum)
[name], [name]               ACCOUNT NO.:  [id number]  MEDICAL RECORD NO.:  [id number]           PATIENT TYPE:  O  LOCATION:  DAYP                          FACILITY:  [facility]  PHYSICIAN:  R. Roetta Sessions, M.D. DATE OF BIRTH:  07/31/1950  DATE OF PROCEDURE:  07/27/2010 DATE OF DISCHARGE:                              OPERATIVE REPORT   PROCEDURE:  Sigmoidoscopy limited to the rectum with biopsy.  INDICATIONS FOR PROCEDURE:  A 60 year old gentleman with abnormal rectum on recent CT, history of diarrhea, nonspecific abdominal discomfort, status post subtotal colectomy back in 2008, radiation chemotherapy for rectal cancer.  Recent CT demonstrates thickening of the rectum.  He has not had any rectal bleeding reported.  Sigmoidoscopy now being done. Risks, benefits, limitations, alternatives, imponderables have been discussed, questions answered.  Please see the documentation in the medical record.  PROCEDURE NOTE:  O2 saturation, blood pressure, pulse, respirations were monitored throughout the entire procedure.  CONSCIOUS SEDATION:  Versed 4 mg IV, Demerol 100 mg IV in divided doses.  INSTRUMENT:  Pentax video chip system, adult colonoscope, and pediatric colonoscope.  Digital rectal exam revealed some mild anal stenosis.  ENDOSCOPIC FINDINGS:  Prep was adequate.  The rectum was surveyed to the level of 15 cm, the lumen was lost.  There was marked friability and frowning in the mucosa with surgical sutures protruding into the lumen. This was markedly abnormal, markedly friable.  I was unable to find the lumen with the adult scope.  I withdrew and obtained a pediatric colonoscope and again was unable to find the upstream lumen, with marked friability.  Biopsies of the area were taken for histologic study.  From this level, the more distal rectum was again assessed including retroflexion of the most distal rectal mucosa and no abnormalities were noted in this region.  The  patient tolerated the procedure well, was taken to PACU.  IMPRESSION:  Markedly abnormal proximal rectum and definitive surgical anastomosis, lumen not seen, and therefore anastomosis in the distal small bowel not surveyed.  I am concerned about the possibility of recurrent neoplasia in this location and given findings.  He may be having spurious diarrhea around the relative obstruction, particularly given CT findings.  RECOMMENDATIONS: 1. Low-residue diet. 2. Follow up on path. 3. Further recommendations to follow.     [name], M.D.     RMR/MEDQ  D:  07/27/2010  T:  07/27/2010  Job:  [id number]  cc:   Dr. Herbie Saxon S. [name], MD Fax: [phone]  Electronically Signed by Lorrin Goodell M.D. on 08/03/2010 02:42:27 PM Electronically Signed by Lorrin Goodell M.D. on 08/03/2010 03:04:09 PM Electronically Signed by Lorrin Goodell M.D. on 08/03/2010 03:29:02 PM Electronically Signed by Lorrin Goodell M.D. on 08/03/2010 03:55:05 PM Electronically Signed by Lorrin Goodell M.D. on 08/03/2010 04:02:50 PM Electronically Signed by Lorrin Goodell M.D. on 08/03/2010 04:34:00 PM Electronically Signed by Lorrin Goodell M.D. on 08/03/2010 05:05:31 PM Electronically Signed by Lorrin Goodell M.D. on 08/03/2010 05:05:31 PM Electronically Signed by Lorrin Goodell M.D. on 08/03/2010 05:32:55 PM Electronically Signed by Lorrin Goodell M.D. on 08/03/2010 05:32:55 PM Electronically Signed by  R. Jena Gauss M.D. on 08/03/2010 04:54:09 PM Electronically Signed by Lorrin Goodell M.D. on 08/03/2010 81:19:14 PM Electronically Signed by Lorrin Goodell M.D. on 08/03/2010 07:36:58 PM

## 2010-08-09 ENCOUNTER — Encounter: Payer: Self-pay | Admitting: Internal Medicine

## 2010-08-09 ENCOUNTER — Other Ambulatory Visit (HOSPITAL_COMMUNITY): Payer: Managed Care, Other (non HMO)

## 2010-08-11 LAB — DIFFERENTIAL
Basophils Relative: 0 % (ref 0–1)
Eosinophils Absolute: 0.1 10*3/uL (ref 0.0–0.7)
Eosinophils Relative: 2 % (ref 0–5)
Lymphocytes Relative: 14 % (ref 12–46)
Lymphs Abs: 0.8 10*3/uL (ref 0.7–4.0)
Monocytes Relative: 8 % (ref 3–12)

## 2010-08-11 LAB — COMPREHENSIVE METABOLIC PANEL WITH GFR
ALT: 26 U/L (ref 0–53)
AST: 26 U/L (ref 0–37)
Albumin: 4 g/dL (ref 3.5–5.2)
Alkaline Phosphatase: 60 U/L (ref 39–117)
BUN: 13 mg/dL (ref 6–23)
CO2: 26 meq/L (ref 19–32)
Calcium: 9.2 mg/dL (ref 8.4–10.5)
Chloride: 109 meq/L (ref 96–112)
Creatinine, Ser: 1.37 mg/dL (ref 0.4–1.5)
GFR calc non Af Amer: 54 mL/min — ABNORMAL LOW
Glucose, Bld: 56 mg/dL — ABNORMAL LOW (ref 70–99)
Potassium: 4.3 meq/L (ref 3.5–5.1)
Sodium: 141 meq/L (ref 135–145)
Total Bilirubin: 0.6 mg/dL (ref 0.3–1.2)
Total Protein: 7.5 g/dL (ref 6.0–8.3)

## 2010-08-11 LAB — CBC
HCT: 43.4 % (ref 39.0–52.0)
Hemoglobin: 14.4 g/dL (ref 13.0–17.0)
MCH: 27.6 pg (ref 26.0–34.0)
MCV: 83.2 fL (ref 78.0–100.0)
Platelets: 130 10*3/uL — ABNORMAL LOW (ref 150–400)
RBC: 5.22 MIL/uL (ref 4.22–5.81)
RDW: 15.3 % (ref 11.5–15.5)
WBC: 5.9 10*3/uL (ref 4.0–10.5)

## 2010-08-12 ENCOUNTER — Encounter: Payer: Self-pay | Admitting: Internal Medicine

## 2010-08-13 LAB — CBC
HCT: 38.5 % — ABNORMAL LOW (ref 39.0–52.0)
Hemoglobin: 13.1 g/dL (ref 13.0–17.0)
MCH: 28.2 pg (ref 26.0–34.0)
RBC: 4.62 MIL/uL (ref 4.22–5.81)
RDW: 15.7 % — ABNORMAL HIGH (ref 11.5–15.5)
WBC: 4.6 10*3/uL (ref 4.0–10.5)

## 2010-08-13 LAB — DIFFERENTIAL
Basophils Absolute: 0 10*3/uL (ref 0.0–0.1)
Basophils Relative: 0 % (ref 0–1)
Lymphocytes Relative: 12 % (ref 12–46)
Monocytes Absolute: 0.3 10*3/uL (ref 0.1–1.0)
Neutro Abs: 3.7 10*3/uL (ref 1.7–7.7)
Neutrophils Relative %: 80 % — ABNORMAL HIGH (ref 43–77)

## 2010-08-13 LAB — COMPREHENSIVE METABOLIC PANEL
Albumin: 3.8 g/dL (ref 3.5–5.2)
BUN: 10 mg/dL (ref 6–23)
Chloride: 110 mEq/L (ref 96–112)
Creatinine, Ser: 1.27 mg/dL (ref 0.4–1.5)
Glucose, Bld: 81 mg/dL (ref 70–99)
Total Bilirubin: 1.3 mg/dL — ABNORMAL HIGH (ref 0.3–1.2)
Total Protein: 6.9 g/dL (ref 6.0–8.3)

## 2010-08-16 LAB — COMPREHENSIVE METABOLIC PANEL
ALT: 26 U/L (ref 0–53)
AST: 25 U/L (ref 0–37)
Alkaline Phosphatase: 80 U/L (ref 39–117)
CO2: 26 mEq/L (ref 19–32)
Calcium: 9 mg/dL (ref 8.4–10.5)
Chloride: 110 mEq/L (ref 96–112)
GFR calc Af Amer: >60 mL/min (ref >60)
GFR calc non Af Amer: 59 mL/min — ABNORMAL LOW (ref >60)
Glucose, Bld: 78 mg/dL (ref 70–99)
Potassium: 4.3 mEq/L (ref 3.5–5.1)
Sodium: 140 mEq/L (ref 135–145)
Total Bilirubin: 0.7 mg/dL (ref 0.3–1.2)

## 2010-08-16 LAB — CBC
HCT: 40.9 % (ref 39.0–52.0)
Hemoglobin: 13.5 g/dL (ref 13.0–17.0)
RBC: 4.86 MIL/uL (ref 4.22–5.81)
RDW: 15.2 % (ref 11.5–15.5)

## 2010-08-16 LAB — DIFFERENTIAL
Basophils Relative: 0 % (ref 0–1)
Eosinophils Absolute: 0.1 10*3/uL (ref 0.0–0.7)
Eosinophils Relative: 2 % (ref 0–5)
Lymphs Abs: 0.5 10*3/uL — ABNORMAL LOW (ref 0.7–4.0)
Neutrophils Relative %: 82 % — ABNORMAL HIGH (ref 43–77)

## 2010-08-17 NOTE — Letter (Signed)
Summary: [facility]  [facility]   Imported By: Rexene Alberts 08/12/2010 16:28:43  _____________________________________________________________________  External Attachment:    Type:   Image     Comment:   External Document

## 2010-08-17 NOTE — Letter (Signed)
Summary: FLEX SIG ORDER  FLEX SIG ORDER   Imported By: Ave Filter 08/09/2010 14:28:46  _____________________________________________________________________  External Attachment:    Type:   Image     Comment:   External Document

## 2010-08-18 ENCOUNTER — Other Ambulatory Visit: Payer: Managed Care, Other (non HMO) | Admitting: Internal Medicine

## 2010-08-18 ENCOUNTER — Ambulatory Visit (HOSPITAL_COMMUNITY)
Admission: RE | Admit: 2010-08-18 | Payer: Managed Care, Other (non HMO) | Source: Ambulatory Visit | Admitting: Internal Medicine

## 2010-08-22 LAB — CEA: CEA: 0.7 ng/mL (ref 0.0–5.0)

## 2010-08-23 ENCOUNTER — Encounter (HOSPITAL_COMMUNITY): Payer: Managed Care, Other (non HMO) | Attending: Oncology

## 2010-08-23 ENCOUNTER — Other Ambulatory Visit (HOSPITAL_COMMUNITY): Payer: Managed Care, Other (non HMO)

## 2010-08-23 DIAGNOSIS — E538 Deficiency of other specified B group vitamins: Secondary | ICD-10-CM | POA: Insufficient documentation

## 2010-08-23 DIAGNOSIS — C189 Malignant neoplasm of colon, unspecified: Secondary | ICD-10-CM

## 2010-08-23 DIAGNOSIS — Z85048 Personal history of other malignant neoplasm of rectum, rectosigmoid junction, and anus: Secondary | ICD-10-CM | POA: Insufficient documentation

## 2010-08-23 DIAGNOSIS — E119 Type 2 diabetes mellitus without complications: Secondary | ICD-10-CM | POA: Insufficient documentation

## 2010-08-30 ENCOUNTER — Encounter (HOSPITAL_COMMUNITY): Payer: Managed Care, Other (non HMO) | Attending: Oncology

## 2010-08-30 DIAGNOSIS — E538 Deficiency of other specified B group vitamins: Secondary | ICD-10-CM

## 2010-08-30 DIAGNOSIS — C189 Malignant neoplasm of colon, unspecified: Secondary | ICD-10-CM

## 2010-08-31 LAB — CEA: CEA: 0.5 ng/mL (ref 0.0–5.0)

## 2010-09-02 DIAGNOSIS — Z85048 Personal history of other malignant neoplasm of rectum, rectosigmoid junction, and anus: Secondary | ICD-10-CM

## 2010-09-02 DIAGNOSIS — Z9049 Acquired absence of other specified parts of digestive tract: Secondary | ICD-10-CM

## 2010-09-02 DIAGNOSIS — K625 Hemorrhage of anus and rectum: Secondary | ICD-10-CM

## 2010-09-06 ENCOUNTER — Telehealth: Payer: Self-pay | Admitting: Internal Medicine

## 2010-09-06 NOTE — Telephone Encounter (Signed)
[name] is scheduled for a Flex Sig in the morning with Dr. Jena Gauss an he has caught a cold an says hes running a fever and wants to know if he should still have the procedure? Could someone please advise him as to what he needs to do?

## 2010-09-06 NOTE — Telephone Encounter (Signed)
I called [name] in endo and cx his procedure.

## 2010-09-06 NOTE — Telephone Encounter (Signed)
Spoke with pt- having a lot of coughing and congestion and temp is 101. Per KJ pt should reschedule if he is feeling that bad. Pt aware, will call back to reschedule. Advised pt he should go to pcp to be checked as well. Pt agreed and stated he would go.

## 2010-09-07 ENCOUNTER — Other Ambulatory Visit: Payer: Managed Care, Other (non HMO) | Admitting: Internal Medicine

## 2010-09-08 LAB — DIFFERENTIAL
Basophils Absolute: 0 10*3/uL (ref 0.0–0.1)
Basophils Relative: 1 % (ref 0–1)
Lymphocytes Relative: 12 % (ref 12–46)
Neutro Abs: 3.1 10*3/uL (ref 1.7–7.7)
Neutrophils Relative %: 78 % — ABNORMAL HIGH (ref 43–77)

## 2010-09-08 LAB — COMPREHENSIVE METABOLIC PANEL
Alkaline Phosphatase: 91 U/L (ref 39–117)
BUN: 11 mg/dL (ref 6–23)
Chloride: 107 mEq/L (ref 96–112)
Creatinine, Ser: 1.29 mg/dL (ref 0.4–1.5)
Glucose, Bld: 74 mg/dL (ref 70–99)
Potassium: 3.9 mEq/L (ref 3.5–5.1)
Total Bilirubin: 1.3 mg/dL — ABNORMAL HIGH (ref 0.3–1.2)
Total Protein: 6.5 g/dL (ref 6.0–8.3)

## 2010-09-08 LAB — CBC
HCT: 38 % — ABNORMAL LOW (ref 39.0–52.0)
MCHC: 34.2 g/dL (ref 30.0–36.0)
MCV: 84.1 fL (ref 78.0–100.0)
Platelets: 85 10*3/uL — ABNORMAL LOW (ref 150–400)
WBC: 3.9 10*3/uL — ABNORMAL LOW (ref 4.0–10.5)

## 2010-09-08 LAB — LIPID PANEL
Cholesterol: 102 mg/dL (ref 0–200)
LDL Cholesterol: 52 mg/dL (ref 0–99)
Triglycerides: 50 mg/dL (ref <150)

## 2010-09-08 LAB — CEA: CEA: 0.6 ng/mL (ref 0.0–5.0)

## 2010-09-27 ENCOUNTER — Telehealth: Payer: Self-pay | Admitting: Internal Medicine

## 2010-09-27 NOTE — Telephone Encounter (Signed)
Ok to proceed. 

## 2010-09-27 NOTE — Telephone Encounter (Signed)
Gastroenterology Pre-Procedure Form  Request Date: 09/20/2010,  Requesting Physician:      PATIENT INFORMATION:  [name] is a 60 y.o., male (DOB=01-Apr-1951).  PROCEDURE: Procedure(s) requested: flexible sigmoidoscopy Procedure Reason: previous colon cancer  PATIENT REVIEW QUESTIONS: The patient reports the following:   1. Diabetes Melitis: no 2. Joint replacements in the past 12 months: no 3. Major health problems in the past 3 months: no 4. Has an artificial valve or MVP:no 5. Has been advised in past to take antibiotics in advance of a procedure like teeth cleaning: no}    MEDICATIONS & ALLERGIES:    Patient reports the following regarding taking any blood thinners:   Plavix? no Aspirin?yes 81 MG Coumadin?  no  Patient confirms/reports the following medications:  Current Outpatient Prescriptions  Medication Sig Dispense Refill  . aspirin 81 MG tablet Take 81 mg by mouth daily.        Marland Kitchen lisinopril (PRINIVIL,ZESTRIL) 10 MG tablet Take 10 mg by mouth daily.        . metoprolol (LOPRESSOR) 50 MG tablet Take 50 mg by mouth daily.        . simvastatin (ZOCOR) 40 MG tablet Take 40 mg by mouth at bedtime. TAKES A HALF A TABLET DAILY         Patient confirms/reports the following allergies:  Allergies  Allergen Reactions  . Oxaprozin     Patient is appropriate to schedule for requested procedure(s): yes    SCHEDULE INFORMATION: Procedure has been scheduled as follows:  Date: 09/28/2010, Time: 2:00PM  Location: [facility]  This Gastroenterology Pre-Precedure Form is being routed to the following provider(s) for review: Gerrit Halls, NP

## 2010-09-28 ENCOUNTER — Other Ambulatory Visit: Payer: Managed Care, Other (non HMO) | Admitting: Internal Medicine

## 2010-09-28 ENCOUNTER — Ambulatory Visit (HOSPITAL_COMMUNITY)
Admission: RE | Admit: 2010-09-28 | Discharge: 2010-09-28 | Disposition: A | Discharge status: Home / Self Care | Payer: Managed Care, Other (non HMO) | Source: Ambulatory Visit | Attending: Internal Medicine | Admitting: Internal Medicine

## 2010-09-28 ENCOUNTER — Other Ambulatory Visit: Payer: Self-pay | Admitting: Internal Medicine

## 2010-09-28 DIAGNOSIS — R933 Abnormal findings on diagnostic imaging of other parts of digestive tract: Secondary | ICD-10-CM | POA: Insufficient documentation

## 2010-09-28 DIAGNOSIS — K6289 Other specified diseases of anus and rectum: Secondary | ICD-10-CM | POA: Insufficient documentation

## 2010-09-28 DIAGNOSIS — I10 Essential (primary) hypertension: Secondary | ICD-10-CM | POA: Insufficient documentation

## 2010-09-28 DIAGNOSIS — Z7982 Long term (current) use of aspirin: Secondary | ICD-10-CM | POA: Insufficient documentation

## 2010-09-28 DIAGNOSIS — K921 Melena: Secondary | ICD-10-CM | POA: Insufficient documentation

## 2010-09-28 DIAGNOSIS — Z85048 Personal history of other malignant neoplasm of rectum, rectosigmoid junction, and anus: Secondary | ICD-10-CM | POA: Insufficient documentation

## 2010-09-28 DIAGNOSIS — R197 Diarrhea, unspecified: Secondary | ICD-10-CM | POA: Insufficient documentation

## 2010-09-28 DIAGNOSIS — Z9049 Acquired absence of other specified parts of digestive tract: Secondary | ICD-10-CM | POA: Insufficient documentation

## 2010-09-28 DIAGNOSIS — E119 Type 2 diabetes mellitus without complications: Secondary | ICD-10-CM | POA: Insufficient documentation

## 2010-09-28 DIAGNOSIS — Z79899 Other long term (current) drug therapy: Secondary | ICD-10-CM | POA: Insufficient documentation

## 2010-09-30 ENCOUNTER — Emergency Department (HOSPITAL_COMMUNITY): Payer: Managed Care, Other (non HMO)

## 2010-09-30 ENCOUNTER — Inpatient Hospital Stay (HOSPITAL_COMMUNITY)
Admission: AD | Admit: 2010-09-30 | Discharge: 2010-10-02 | DRG: 684 | Disposition: A | Discharge status: Home / Self Care | Payer: Managed Care, Other (non HMO) | Source: Ambulatory Visit | Attending: Internal Medicine | Admitting: Internal Medicine

## 2010-09-30 DIAGNOSIS — E860 Dehydration: Secondary | ICD-10-CM | POA: Diagnosis present

## 2010-09-30 DIAGNOSIS — I951 Orthostatic hypotension: Secondary | ICD-10-CM | POA: Diagnosis present

## 2010-09-30 DIAGNOSIS — N179 Acute kidney failure, unspecified: Principal | ICD-10-CM | POA: Diagnosis present

## 2010-09-30 DIAGNOSIS — I10 Essential (primary) hypertension: Secondary | ICD-10-CM | POA: Diagnosis present

## 2010-09-30 DIAGNOSIS — Z85038 Personal history of other malignant neoplasm of large intestine: Secondary | ICD-10-CM

## 2010-09-30 DIAGNOSIS — R911 Solitary pulmonary nodule: Secondary | ICD-10-CM | POA: Diagnosis present

## 2010-09-30 LAB — CBC
Hemoglobin: 13.2 g/dL (ref 13.0–17.0)
MCHC: 34.9 g/dL (ref 30.0–36.0)
RBC: 4.41 MIL/uL (ref 4.22–5.81)
WBC: 8.7 10*3/uL (ref 4.0–10.5)

## 2010-09-30 LAB — POCT CARDIAC MARKERS
CKMB, poc: 1.9 ng/mL (ref 1.0–8.0)
Myoglobin, poc: >500 ng/mL (ref 12–200)
Troponin i, poc: <0.05 ng/mL (ref 0.00–0.09)

## 2010-09-30 LAB — BASIC METABOLIC PANEL
CO2: 21 mEq/L (ref 19–32)
Calcium: 9.4 mg/dL (ref 8.4–10.5)
GFR calc Af Amer: 13 mL/min — ABNORMAL LOW (ref >60)
Potassium: 4.1 mEq/L (ref 3.5–5.1)
Sodium: 130 mEq/L — ABNORMAL LOW (ref 135–145)

## 2010-09-30 NOTE — Op Note (Signed)
  [name], [name]               ACCOUNT NO.:  [id number]  MEDICAL RECORD NO.:  [id number]           PATIENT TYPE:  O  LOCATION:  DAYP                          FACILITY:  [facility]  PHYSICIAN:  R. Roetta Sessions, M.D. DATE OF BIRTH:  [birth year]  DATE OF PROCEDURE:  09/28/2010 DATE OF DISCHARGE:                              OPERATIVE REPORT   PROCEDURE:  Sigmoidoscopy with biopsy.  INDICATIONS FOR PROCEDURE:  A 60 year old gentleman status post subtotal colectomy, low anterior resection for rectal carcinoma.  He has had some nonspecific bowel complaints including some diarrhea and rectal bleeding.  CT demonstrates some thickening in the area of the rectum at the anastomosis, attempted sigmoidoscopy back in February, could not find the lumen.  There was marked inflammation in the area of anastomosis at 15 cm.  Biopsies were negative for malignancy.  He is here in the setting of a better prep, hopefully to thoroughly inspect this area.  Risks, benefits, limitations, alternatives, imponderables have been reviewed, questions have been answered.  Please see the documentation in the medical record.  PROCEDURE NOTE:  O2 saturation, blood pressure, pulse, respirations were monitored throughout the entire procedure.  CONSCIOUS SEDATION:  IV Versed, Demerol incremental doses.  Digital rectal exam revealed no abnormalities.  Endoscopic findings:  Prep was adequate.  Examination of the rectum to 15 cm demonstrated normal mucosa, retroflexion was performed.  No other abnormalities, 15 cm, there was marked inflammatory changes and narrowing of the lumen.  I was able to find the lumen, the lumen was patent, but narrowed and there was some stricturing about 2 cm in length and then at the anastomosis, easily advanced the scope up into the distal neoterminal ileum, a good 30 cm this segment of GI tract appeared normal.  Pulled the scope down across the abnormal area of the rectum/anastomosis 15-17  cm.  Marked friability of this area.  Multiple biopsies were taken.  There was protruding suture in the lumen of the GI tract in this level.  The patient tolerated the procedure well.  IMPRESSION:  Marked inflammatory changes with some noncritical stricturing at the surgical anastomosis (15-17 cm in from the anal verge) status post biopsy.  I suspect this may well be more likely related to granulation tissue and recurrent neoplasm, review path as becomes available and in the interim, we will proceed with Canasa suppositories 1 g suppository per rectum at bedtime x30 days.     [name], M.D.     RMR/MEDQ  D:  09/28/2010  T:  09/28/2010  Job:  [id number]  cc:   Kirk Ruths, M.D. Fax: [phone]  [name]. [name], MD Fax: [phone]  Electronically Signed by Lorrin Goodell M.D. on 09/30/2010 03:48:47 PM

## 2010-10-01 ENCOUNTER — Inpatient Hospital Stay (HOSPITAL_COMMUNITY): Payer: Managed Care, Other (non HMO)

## 2010-10-01 LAB — GLUCOSE, CAPILLARY
Glucose-Capillary: 102 mg/dL — ABNORMAL HIGH (ref 70–99)
Glucose-Capillary: 97 mg/dL (ref 70–99)

## 2010-10-01 LAB — CARDIAC PANEL(CRET KIN+CKTOT+MB+TROPI)
CK, MB: 3 ng/mL (ref 0.3–4.0)
Relative Index: 2.7 — ABNORMAL HIGH (ref 0.0–2.5)
Troponin I: <0.3 ng/mL (ref <0.30)

## 2010-10-01 LAB — COMPREHENSIVE METABOLIC PANEL
CO2: 20 mEq/L (ref 19–32)
Chloride: 102 mEq/L (ref 96–112)
GFR calc Af Amer: 17 mL/min — ABNORMAL LOW (ref >60)
Potassium: 3.8 mEq/L (ref 3.5–5.1)
Sodium: 134 mEq/L — ABNORMAL LOW (ref 135–145)

## 2010-10-01 LAB — DIFFERENTIAL
Basophils Absolute: 0 10*3/uL (ref 0.0–0.1)
Basophils Relative: 0 % (ref 0–1)
Eosinophils Absolute: 0.1 10*3/uL (ref 0.0–0.7)
Lymphocytes Relative: 12 % (ref 12–46)
Lymphs Abs: 1 10*3/uL (ref 0.7–4.0)
Monocytes Absolute: 0.6 10*3/uL (ref 0.1–1.0)
Monocytes Relative: 10 % (ref 3–12)
Monocytes Relative: 11 % (ref 3–12)
Neutro Abs: 3.7 10*3/uL (ref 1.7–7.7)
Neutrophils Relative %: 77 % (ref 43–77)
Neutrophils Relative %: 69 % (ref 43–77)

## 2010-10-01 LAB — CBC
HCT: 33.2 % — ABNORMAL LOW (ref 39.0–52.0)
Hemoglobin: 11.3 g/dL — ABNORMAL LOW (ref 13.0–17.0)
MCH: 29.2 pg (ref 26.0–34.0)
MCHC: 34 g/dL (ref 30.0–36.0)
MCV: 85.8 fL (ref 78.0–100.0)
RBC: 3.87 MIL/uL — ABNORMAL LOW (ref 4.22–5.81)

## 2010-10-01 LAB — MAGNESIUM: Magnesium: 2.7 mg/dL — ABNORMAL HIGH (ref 1.5–2.5)

## 2010-10-01 NOTE — H&P (Signed)
NAMEDILRAJ, KILLGORE               ACCOUNT NO.:  [id number]  MEDICAL RECORD NO.:  [id number]           PATIENT TYPE:  I  LOCATION:  IC08                          FACILITY:  [facility]  PHYSICIAN:  Vania Rea, M.D. DATE OF BIRTH:  [birth year]  DATE OF ADMISSION:  09/30/2010 DATE OF DISCHARGE:  LH                             HISTORY & PHYSICAL   PRIMARY CARE PHYSICIAN:  Corrie Mckusick, MD  ONCOLOGIST:  [name]. [name], MD  GASTROENTEROLOGIST:  [name], MD  CHIEF COMPLAINT:  Syncopal episode today  HISTORY OF PRESENT ILLNESS:  This is a 60 year old obese Caucasian gentleman who had subtotal colectomy in 2008 for cancer of the colon. The patient had subsequent radiation therapy and chemotherapy and is followed  regularly  by both Dr. Roetta Sessions and Dr. [name].   The patient says he has blood work done by Dr. [name] on a monthly basis,  however, these results are not available in the [facility].  Additionally, because of worsening diarrhea over the past few months, the patient had a colonoscopy done 2 days ago to follow up on inflammation seen on a recent CT scan.  The CT scan was done without intravenous contrast.  He has noted that since the colonoscopy just not been feeling himself.  He has been very anorexic, has not been eating well and then, today,while standing looking out the window, he suddenly had a dazed feeling and woke up to find himself lying on the floor, and thinks he may have been unconscious for about a minute.  He had no urinary incontinence.  There was no evidence to suggest any type of seizure activity, and he was able to gradually sit up and steady himself, although he did not feel quite right.  He called family members, and he was transported the [facility] Emergency Room.  In the [facility] Emergency Room, he was hypotensive and blood work suggested dehydrated, so the hospitalist service was called to assist  with management.  The patient gives no history of chest pains or shortness of breath.  No palpitations.  He has had no orthopnea nor lower extremity edema.  The patient reports that since his colectomy he has had persistent diarrhea depending on what foods to eat on a good day.  He typically has about six small loose stools per day.  On a bad day, he may have stools every hour.  He has never been told that he has kidney failure.  He has never had passage of any blood.  PAST MEDICAL HISTORY: 1. Colorectal cancer status post subtotal colectomy as noted above,     status post radiation therapy and chemotherapy. 2. B12 deficiency. 3. History of splenomegaly and thrombocytopenia. 4. Fatty liver. 5. Coronary artery disease status post MI. 6. Diabetes. 7. Cataracts. 8. Hypertension. 9. Sleep apnea. 10.History of kidney stones.  MEDICATIONS:  Medication reconciliation has not yet been done, but he appears to take: 1. Baby aspirin daily. 2. Metoprolol/hydrochlorothiazide combination daily. 3. Multivitamins daily. 4. Lisinopril 10 mg daily. 5. Simvastatin 20 mg daily. 6. B12 injections monthly.  He is allergic  to [facility].  SOCIAL HISTORY:  Remote history of tobacco abuse.  Currently, smokes a cigar occasionally.  Drinks alcohol very rarely.  Denies illicit drug use.  He is a retired Estate manager/land agent.  FAMILY HISTORY:  Significant for father who had colon cancer.  Mother who had uterine cancer and hypertension.  REVIEW OF SYSTEMS:  Other than noted above unremarkable.  PHYSICAL EXAMINATION:  GENERAL:  A very pleasant obese middle-aged Caucasian gentleman lying flat in the stretcher, not distressed. VITAL SIGNS:  His temperature is 97.2, pulse 61, respiration 15, blood pressure 96/53.  He is saturating 100% on room air. HEENT:  His pupils are round and equal.  Mucous membranes pink. Anicteric. NECK:  No cervical lymphadenopathy or thyromegaly.  He does have a thick neck.  He  has no carotid bruits. CHEST:  Clear to auscultation bilaterally. CARDIOVASCULAR:  Regular rhythm.  No murmurs. ABDOMEN:  Obese, soft, nontender.  No masses. EXTREMITIES:  Without edema.  No bony joint deformities. CENTRAL NERVOUS SYSTEM: Cranial nerves II through XII are grossly intact.  He has no focal lateralizing signs.  LABORATORY DATA:  White count is 8.7, hemoglobin 13.2, platelets 154. He has normal differential.  His sodium is 130, potassium 4.1, chloride 95, CO2 of 21, glucose 126, BUN 70, creatinine 5.4, calcium 9.4.  The most recent creatinine we have available is 1.36 in January and at that time, his BUN was 9.  A two-view chest x-ray shows no acute cardiopulmonary abnormality.  He has an ill-defined right upper lobe 11- mm nodule.  Noncontrast CT recommended on a non-emergent basis. Metastatic disease versus a primary pulmonary lesion is not excluded.  ASSESSMENT: 1. Acute renal failure. 2. Syncope. 3. Dehydration causing the above. 4. 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 5. History of colon cancer. 6. Lung nodule, questionable metastatic cancer. 7. History of diabetes controlled on diet. 8. Hypertension, now hypotensive due to dehydration.  PLAN: 1. We will admit this gentleman to the step-down unit for hydration. 2. We will presume renal failure is acute, and we will add bicarbonate to his     fluids.  If his renal function is not improving fairly rapidly, we     will consider consulting nephrologist. 3. We will discontinue nephrotoxins such as hydrochlorothiazide and     lisinopril, and we will hold his antihypertensives for the time     being. 4. We will get serial cardiac enzymes.  He does have a remote history     of myocardial disease, and it is possible that he does have silent     myocardial ischemia. 5. Other plans as per orders.     Vania Rea, M.D.     LC/MEDQ  D:   10/01/2010  T:  10/01/2010  Job:  [id number]  cc:   Corrie Mckusick, M.D. Fax: [phone]  [name]. [name], MD Fax: [phone]  R. Roetta Sessions, M.D. [address] [place] [address]  Electronically Signed by Vania Rea M.D. on 10/01/2010 06:19:13 AM

## 2010-10-01 NOTE — Group Therapy Note (Signed)
  [name], NAVARRA               ACCOUNT NO.:  [id number]  MEDICAL RECORD NO.:  [id number]           PATIENT TYPE:  I  LOCATION:  IC08                          FACILITY:  [facility]  PHYSICIAN:  Wilson Singer, M.D.DATE OF BIRTH:  1950/11/04  DATE OF PROCEDURE:  10/01/2010 DATE OF DISCHARGE:                                PROGRESS NOTE   This man was admitted yesterday with what looked like a hypovolemic syncopal episode and he was noted to be severely dehydrated with a creatinine in the 5 range.  He has been given IV fluids overnight and he feels much improved.  In January 2012, his creatinine was 1.36 with a BUN of 9.  He has been feeling nauseous for at least the last week or so, has had 1 episode of vomiting.  He had a sigmoidoscopy 3 days ago by Dr. Jena Gauss because of chronic diarrhea and possibly some rectal bleeding. The sigmoidoscopy demonstrated marked inflammatory changes with some noncritical stricturing at the surgical anastomosis which is 15-17 cm from the anal verge.  Biopsy was taken and this confirms inflammation, but no evidence of malignancy.  Also, a chest x-ray done yesterday shows a right upper lobe 1.1-cm pulmonary nodule.  He feels better this morning having received intravenous fluids.  PHYSICAL EXAMINATION:  VITAL SIGNS:  He is afebrile.  Temperature 97.7. Blood pressure 93/44, still somewhat soft.  Pulse 57 and saturation 98% on room air. GENERAL:  He does not look shocked and he is refusing his peripheries very well.  He is alert and mentating. CARDIOVASCULAR:  Heart sounds are present and normal without murmurs. LUNGS:  Lung fields are clear. NECK:  There is no neck or supraclavicular lymphadenopathy. ABDOMEN:  Soft and nontender with no hepatosplenomegaly.  There are no masses felt. NEUROLOGICAL:  He is alert and oriented without any focal neurological signs.  INVESTIGATIONS:  Sodium 134, potassium 3.8, bicarbonate 20, BUN 68 which is improving,  creatinine 4.29 which is also improving, albumin 3.6 with a calcium of 8.9, magnesium is normal.  Hemoglobin 11.3, white blood cell count 5.3, and platelets 106.  IMPRESSION: 1. Hypovolemia and acute renal failure from dehydration. 2. Colon cancer. 3. History of hypertension. 4. Noted to be diabetic in the history. 5. Abnormal chest x-ray.  PLAN: 1. Continue IV fluids. 2. CT chest scan noncontrast to further look at the right upper lobe     pulmonary nodule. 3. Move to regular floor.     Wilson Singer, M.D.     NCG/MEDQ  D:  10/01/2010  T:  10/01/2010  Job:  [id number]  Electronically Signed by Lilly Cove M.D. on 10/01/2010 12:44:39 PM

## 2010-10-02 LAB — GLUCOSE, CAPILLARY: Glucose-Capillary: 113 mg/dL — ABNORMAL HIGH (ref 70–99)

## 2010-10-02 LAB — DIFFERENTIAL
Basophils Absolute: 0 10*3/uL (ref 0.0–0.1)
Lymphocytes Relative: 16 % (ref 12–46)
Monocytes Absolute: 0.4 10*3/uL (ref 0.1–1.0)
Neutro Abs: 2.7 10*3/uL (ref 1.7–7.7)

## 2010-10-02 LAB — BASIC METABOLIC PANEL
CO2: 22 mEq/L (ref 19–32)
Chloride: 106 mEq/L (ref 96–112)
Glucose, Bld: 88 mg/dL (ref 70–99)
Potassium: 4.1 mEq/L (ref 3.5–5.1)
Sodium: 137 mEq/L (ref 135–145)

## 2010-10-02 LAB — CBC
HCT: 35.5 % — ABNORMAL LOW (ref 39.0–52.0)
Hemoglobin: 12.1 g/dL — ABNORMAL LOW (ref 13.0–17.0)
MCHC: 34.1 g/dL (ref 30.0–36.0)
RBC: 4.13 MIL/uL — ABNORMAL LOW (ref 4.22–5.81)
WBC: 3.8 10*3/uL — ABNORMAL LOW (ref 4.0–10.5)

## 2010-10-02 NOTE — Discharge Summary (Signed)
  [name], [name]               ACCOUNT NO.:  [id number]  MEDICAL RECORD NO.:  [id number]           PATIENT TYPE:  I  LOCATION:  A309                          FACILITY:  [facility]  PHYSICIAN:  Wilson Singer, M.D.DATE OF BIRTH:  12-22-1950  DATE OF ADMISSION:  09/30/2010 DATE OF DISCHARGE:  LH                              DISCHARGE SUMMARY   FINAL DISCHARGE DIAGNOSES: 1. Severe dehydration and hypovolemia with hypotension, resolved. 2. Colon cancer. 3. Abnormal CT chest scan with 10 x 8 x 10-mm diameter nonspecific     right upper lobe pulmonary nodule.  Consider PET scan. 4. Hypertension.  CONDITION ON DISCHARGE:  Stable.  MEDICATIONS ON DISCHARGE: 1. Aspirin 81 mg daily. 2. Lisinopril 10 mg daily. 3. Metoprolol 50 mg daily. 4. Simvastatin 20 mg nightly.  HISTORY:  This very pleasant 60 year old man was admitted in a hypovolemic state having had a syncopal episode.  Please see initial history and physical examination done by Dr. Orvan Falconer.  HOSPITAL PROGRESS:  He was admitted actually to step-down unit and aggressively rehydrated with intravenous fluids.  He did well with this. Cardiac enzymes were done and these were negative for any kind of myocardial ischemia.  Hemoglobin A1c was also measured and it was 5.1. C. diff toxin was negative as he had some diarrhea.  Today, he feels very well and back to his normal self.  PHYSICAL EXAMINATION:  VITAL SIGNS:  Temperature 97.6, blood pressure 112/74, pulse 58, saturation 98% on room air.  Orthostatics have been done today and these do not show any postural drop anymore.  His lab work today is much improved with sodium of 137, potassium 4.1, bicarbonate 22, BUN of 47, and creatinine 1.87.  His admission creatinine was 5.40.  Hemoglobin 12.1, white blood cell count 3.8, platelets 100.  DISPOSITION:  The patient is now stable to be discharged home.  I think he will need to follow up with his oncologist, Dr. [name] to  review CT chest scan finding to consider whether he needs a PET scan to evaluate the abnormalities seen in the right upper lobe.     Wilson Singer, M.D.     NCG/MEDQ  D:  10/02/2010  T:  10/02/2010  Job:  [id number]  cc:   Corrie Mckusick, M.D. Fax: [phone]  [name]. [name], MD Fax: [phone]  R. Roetta Sessions, M.D. [address] [place] [address]  Electronically Signed by Lilly Cove M.D. on 10/02/2010 12:03:37 PM

## 2010-10-04 ENCOUNTER — Encounter (HOSPITAL_COMMUNITY): Payer: Managed Care, Other (non HMO) | Attending: Oncology

## 2010-10-04 ENCOUNTER — Other Ambulatory Visit (HOSPITAL_COMMUNITY): Payer: Self-pay | Admitting: Oncology

## 2010-10-04 DIAGNOSIS — E119 Type 2 diabetes mellitus without complications: Secondary | ICD-10-CM | POA: Insufficient documentation

## 2010-10-04 DIAGNOSIS — C189 Malignant neoplasm of colon, unspecified: Secondary | ICD-10-CM

## 2010-10-04 DIAGNOSIS — E538 Deficiency of other specified B group vitamins: Secondary | ICD-10-CM

## 2010-10-04 DIAGNOSIS — C7800 Secondary malignant neoplasm of unspecified lung: Secondary | ICD-10-CM | POA: Insufficient documentation

## 2010-10-04 DIAGNOSIS — Z85048 Personal history of other malignant neoplasm of rectum, rectosigmoid junction, and anus: Secondary | ICD-10-CM | POA: Insufficient documentation

## 2010-10-05 LAB — CEA: CEA: 5.2 ng/mL — ABNORMAL HIGH (ref 0.0–5.0)

## 2010-10-07 ENCOUNTER — Other Ambulatory Visit (HOSPITAL_COMMUNITY): Payer: Self-pay | Admitting: Oncology

## 2010-10-07 DIAGNOSIS — Z85038 Personal history of other malignant neoplasm of large intestine: Secondary | ICD-10-CM

## 2010-10-07 DIAGNOSIS — R911 Solitary pulmonary nodule: Secondary | ICD-10-CM

## 2010-10-12 NOTE — Discharge Summary (Signed)
NAMEARCHER, MOIST               ACCOUNT NO.:  [id number]   MEDICAL RECORD NO.:  [id number]          PATIENT TYPE:  INP   LOCATION:  A211                          FACILITY:  [facility]   PHYSICIAN:  Tilford Pillar, MD      DATE OF BIRTH:  10/15/1950   DATE OF ADMISSION:  04/13/2007  DATE OF DISCHARGE:  12/03/2008LH                               DISCHARGE SUMMARY   ADMISSION DIAGNOSES:  1. Hepatic flexure mass.  2. Rectal carcinoma.   DISCHARGE DIAGNOSES:  1. Rectal carcinoma.  2. Benign proximal colonic mass.  3. Coronary artery disease.  4. Diabetes mellitus, type 2.  5. Hypercholesterolemia.  6. Chronic history of anemia.  7. Renal calculi.  8. Suspected sleep apnea.   OPERATION/PROCEDURE:  1. Subtotal colectomy with ileorectal anastomosis.  2. Low anterior resection with ileodistal rectal anastomosis.   DISPOSITION:  Home.   BRIEF HISTORY AND PHYSICAL:  Please see the admission history and  physical for complete history and physical.  The patient is a 60-year-  old male who presented with a long history of anemia.  He had had a  recent colonoscopy performed by Dr. Jena Gauss demonstrating a rectosigmoid  mass as well as a hepatic flexure mass and both requiring resection.  At  this point the patient was admitted for the planned procedure and was  taken to the operating room for the actual operation the same day.   HOSPITAL COURSE:  The patient was admitted on April 13, 2007.  He  underwent the initial operation and was closely followed in the  intensive care unit following secondarily to his other medical  conditions and history of suspected sleep apnea. At this point he was  eventually able to be transferred to a telemetry floor for continued  monitoring.  He had a continued recovery.  During his recovery, final  pathology failed to demonstrate the rectal mass. Therefore, additional  tissue was required and this was discussed at length with the patient  and the patient's  family.  The patient was initially hesitant about  returning to the operating room, especially given the history of his  father dying of colon cancer around Thanksgiving time and the patient's  wish was to return home for the holiday.  However, the patient did  decide to proceed with the operation.  He underwent the return trip to  the operating room for additional rectal tissue.  He tolerated the  procedure well and again returned to the intensive care unit for initial  monitoring and his recovery was unremarkable at this point.  He was  initiated on nutritional support due to the long period of NPO.  He did  start having bowel function.  As expected this was initially loose, but  became more solid with continued management.  He was advanced on his  diet which he tolerated well having return of bowel function which was  relatively controlled given the low anastomosis as well as the patient's  pain being well controlled.  Plans were made for discharge on May 02, 2007.  The patient was comfortable and the patient  was discharged  home at that time.   DISCHARGE INSTRUCTIONS:  The patient is to resume a diabetic, high fiber  diet.  He was instructed on wound care.  He was discharged with drains  in place and was instructed on drain care.   ACTIVITY:  He was instructed to increase slowly.  He may walk up steps.  He may shower.  He is not to lift anything greater than 20 pounds for  the next three weeks.  He is not to drive while taking pain medications.  He is to follow up with me on May 08, 2007.   DISCHARGE MEDICATIONS:  1. Vicodin 5/500 one orally every four hours as needed for pain.  2. Lomotil 1 to 2 tablets orally every six hours as needed for loose      stools.  3. Iron 325 mg one tablet orally three times daily.   The patient is to resume all preoperative medications including  metformin, glyburide, amlodipine, trandolapril, Lipitor, Metoprolol,  aspirin 81 mg.  He was  also instructed he may use Imodium one to two  times per day as needed for breakthrough loose stools.      Tilford Pillar, MD  Electronically Signed     BZ/MEDQ  D:  06/15/2007  T:  06/15/2007  Job:  [id number]   cc:   Patrica Duel, M.D.  Fax: [phone]

## 2010-10-12 NOTE — H&P (Signed)
[name], [name]               ACCOUNT NO.:  [id number]   MEDICAL RECORD NO.:  [id number]          PATIENT TYPE:  AMB   LOCATION:  DAY                           FACILITY:  [facility]   PHYSICIAN:  R. Roetta Sessions, M.D. DATE OF BIRTH:  [birth year]   DATE OF ADMISSION:  DATE OF DISCHARGE:  LH                              HISTORY & PHYSICAL   CHIEF COMPLAINT:  Hepatomegaly and splenomegaly and followup colonoscopy  with history of rectal adenocarcinoma.   HISTORY OF PRESENT ILLNESS:  [name] is a 59 year old Caucasian male  with history of rectal adenocarcinoma who is status post two surgeries.  He initially underwent a total colectomy for a hepatic flexure lesion  which was found to be a tubulovillous adenoma.  He then underwent a low  anterior resection with primary anastomosis for rectal adenocarcinoma.  He was unable to complete his last dose of chemotherapy per his report  due to thrombocytopenia.  This was in June 2009.  He did undergo 6 weeks  of radiation as well.  He lost a total of 65 pounds.  He has gained  about 15 pounds back.  He denies any abdominal pain.  He is having  anywhere from four to five loose bowel movements per day.  He denies any  rectal bleeding or melena.  He occasionally takes Imodium p.r.n. which  does seem to help.  His appetite has been okay.  He denies any nausea or  vomiting.  He is also referred for evaluation of splenomegaly which went  from 941 cubic centimeter in 2003 to 1721 cubic centimeters as a  December 2008 with probable fatty infiltration of the liver.  He has had  thrombocytopenia as well.  There is no known history of chronic liver  disease.  He does have a history of heavy alcohol use for 8 or more  years, but quit last year.  He denies any tattoos.  He has had blood  transfusion, but that was around the time of his colon surgeries.  He  denies any IV or intranasal drug use.  He `denies any history of  hepatitis or jaundice.   PAST  MEDICAL AND SURGICAL HISTORY:  He has history of rectal  adenocarcinoma with postoperative radiation and concomitant  chemotherapy.  He has history of B12 deficiency, chronic iron deficiency  anemia, splenomegaly, fatty liver, thrombocytopenia, coronary artery  disease status post MI, diabetes mellitus, hypertension, sleep apnea,  cataracts.  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  He has had right  elbow surgery, renolithiasis.   CURRENT MEDICATIONS:  1. Lipitor 10 mg nightly.  2. Aspirin 81 mg daily.  3. Metoprolol 50 mg b.i.d.  4. Tylenol p.r.n.  5. Actos 50 mg daily.  6. Trandolapril 4 mg daily.  7. Iron once daily.  8. B12 monthly injections.  9. Imodium or Lomotil p.r.n.   ALLERGIES:  DAYPRO.   Family history is positive for father deceased with colon cancer at age  67.  Mother age 22, has history of uterine cancer and hypertension.  He  has two healthy siblings.  [name] is separated.  He has 4 children.  He is disabled.  He quit smoking about 5 years ago.  Quit drinking  alcohol last year.  Denies any drug use.  History of heavy alcohol use  on the weekends years ago for at least 8+ years.   REVIEW OF SYSTEMS:  See HPI, otherwise negative.   PHYSICAL EXAMINATION:  VITAL SIGNS:  Weight 230 pounds, height 71  inches, temp 97.5, blood pressure 124/78, pulse 80.  GENERAL:  He is a well-developed, well-nourished Caucasian male in no  acute distress.  HEENT:  Sclerae clear and nonicteric.  Conjunctivae pink.  Oropharynx  pink and moist without any lesions.  NECK:  Supple without mass or thyromegaly.  Chest:  Heart regular rate and rhythm.  Normal S1 and S2.  No murmurs,  clicks, rubs, or gallops.  LUNGS:  Clear to auscultation bilaterally.  ABDOMEN:  Protuberant, positive bowel sounds x4.  No bruits auscultated.  ABDOMEN:  Soft,  nontender, unable to palpate hepatomegaly.  He does have  palpable splenomegaly.  There is no rebound, tenderness, or guarding.  EXTREMITIES:  Without edema bilaterally.   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  He will be due next month for annual surveillance.  He has done quite well except for the fact that he is unable to complete  his chemotherapy due to thrombocytopenia and he is sent for further  evaluation of this along with splenomegaly.  It is possible he could  have occult nonalcoholic fatty liver disease, alcoholic liver disease  given his somewhat significant past history, or other etiology.   PLAN:  1. We would proceed with abdominal ultrasound.  2. CBC, INR, LFTs.  3. He will need surveillance colonoscopy with Dr. Jena Gauss next month,      November 2009.  We discussed the procedure including risks and      benefits which include but not limited to bleeding, infection,      perforation, drug reaction.  He agrees and plan consent will be      obtained.  4. If abdominal ultrasound does show cirrhosis, he will need hepatitis      markers to include A, B, and C as well as an AFP.   Thank you Dr. [name] for allowing [place] to participate in the care of  [name].      Lorenza Burton, N.P.      [name], M.D.  Electronically Signed    KJ/MEDQ  D:  03/05/2008  T:  03/06/2008  Job:  [id number]   cc:   [name]. [name], MD  Fax: [phone]   Corrie Mckusick, M.D.  Fax: [phone]

## 2010-10-12 NOTE — Op Note (Signed)
[name], [name]               ACCOUNT NO.:  [id number]   MEDICAL RECORD NO.:  [id number]          PATIENT TYPE:  INP   LOCATION:  IC10                          FACILITY:  [facility]   PHYSICIAN:  Tilford Pillar, MD      DATE OF BIRTH:  [birth year]   DATE OF PROCEDURE:  04/22/2007  DATE OF DISCHARGE:                               OPERATIVE REPORT   CONTINUATION:  The deep subcutaneous tissue was divided using  electrocautery.  Previous Vicryl sutures were identified in the deep  subcutaneous tissues   DICTATION ENDED HERE      Tilford Pillar, MD  Electronically Signed     BZ/MEDQ  D:  04/22/2007  T:  04/22/2007  Job:  [id number]

## 2010-10-12 NOTE — Op Note (Signed)
[name], [name]               ACCOUNT NO.:  [id number]   MEDICAL RECORD NO.:  [id number]          PATIENT TYPE:  INP   LOCATION:  IC10                          FACILITY:  [facility]   PHYSICIAN:  Tilford Pillar, MD      DATE OF BIRTH:  1951/01/25   DATE OF PROCEDURE:  04/22/2007  DATE OF DISCHARGE:                               OPERATIVE REPORT   CONTINUATION:  At this point upon division of the subcuticular tissue  with the electrocautery, the Novafil suture from the prior fascial  closure was identified.  The suture knot was identified, was elevated,  and was cut with heavy scissors.  At this point the remaining running  suture was pulled free allowing entrance into the peritoneal cavity.  At  this point, I was met with a serosanguineous acidic fluid, no evidence  of purulence, no odor was encountered.  The fluid did not appear enteric  in character and this was aspirated with a Yachter sucker.  At this  point the incision was elongated inferiorly with the electrocautery  allowing additional exposure into the peritoneal cavity.  At this point  the distended loops of small bowel were gently packed into the upper  abdomen using laparoscopy sponges.  The anastomosis was visualized.  This was intact.  There was no evidence of any anastomosis dehiscence.  Small bowel looked healthy and viable.  No evidence of any hematoma or  intra-abdominal abscess was encountered.  At this point our attention  was turned to the rectal mass.  Palpation identified the mass near the  peritoneal reflection of the rectum.  At this point sharp dissection  along the lateral aspects of the rectum were completed using Metzenbaum  scissors to start the lateral dissection of the rectum from the  peritoneal reflection.  This was continued distally.  Once the  dissection plane was carried beyond the palpable mass both to the right  and left of the rectum, the anterior peritoneal reflection onto the  rectum was  scored with the electrocautery and was then sharply dissected  free from the rectum.  At this point the rectum was dissected free with  blunt dissection along the presacral fascia.  This allowed for  additional elongation of the rectum up into the field allowing  approximately 2 cm of rectum distal to the palpable mass to be  incorporated up into the field out of the pelvis.  At this point, the  decision was made to divided the proximal portion of bowel proximal to  the previously placed anastomosis.  A window was created in the interior  of the distal ileum just proximal to the previous anastomosis.  A GIA  stapler was then utilized to divided the distal ileum.  The LigaSure was  then brought to the field and was utilized to divide the mesentery of  the distal ileum.  At this point the remaining turning points around the  rectum were freed using sharp dissection and a reticulating TA stapler  was brought to the field.  A Green thick tissue load was utilized.  The  rectum was gently  elevated out of the pelvis allowing the reticulating  stapler to be placed distal to this.  This was somewhat difficult due to  both the patient's body habitus as well as the distal nature of the  rectal mass.  The TA stapler was fired and heavy scissors were utilized  to divide the proximal to the stapler.  At this point the rectal  specimen was freed.  Upon the open lumen of the divided rectum, the  rectal mass was easily evident.  It did not appear to be grossly divided  by the TA stapler, it was felt at this time that additional tissue was  clearly needed.  Allis clamps were then placed into the stapled rectal  stump and were utilized to elevate the rectal stump out of the pelvis  allowing the second firing of a new TA stapler to be utilized.  This was  similarly fired and an angled scalpel was utilized to remove the  remaining proximal rectal tissue.  This was also placed on the back  table as a permanent  specimen along with the previously obtained ileum  and rectum.  At this point hemostasis was good.  There were several  areas where the staples did not completely ligate the lumen of the  rectum, therefore, a running locked 2-0 Prolene suture was utilized to  close the rectal stump.  With this suture in place, attention was turned  to the distal portion of the ileum.   As the divided portion of the ileum was under tension in order to reach  the rectal stump, the ileum was positioned demonstrating a more proximal  region that would easily reach the rectal stump, therefore, a window was  created behind this portion of the ileum.  I reloaded the GIA stapler  and it was utilized to divide the ileum and the LigaSure was brought  back to the field to divide the distal portion of the mesentery.  This  small portion of terminal ileum was placed on the back table with the  rest of the specimens and was sent as a permanent specimen to pathology.  At this point, we divided into the ileum, easily reached the rectal  stump.  An auto-pursestring was placed across the distal ileum after the  mesenteric fat was cleared from the overlying serosa.  With the auto-  pursestring in place, the staple line was trimmed using heavy Mayo  scissors.  The anvil of a 29 BEA stapler was then inserted into the open  lumen and the pursestring was secured.  Minimal sulcus was encountered  during this portion of the procedure and at this point, attention was  turned to completing the anastomosis.   At this point I went below and through a transanal approach inserted a  proctoscope.  Upon the proctoscope and insufflating the rectal stump,  there was noted to be additional air leak.  The location was identified  by Dr. Lovell Sheehan and a Prolene suture was utilized to close this in a  simple interrupted fashion.  At this point a good seal was obtained  along the rectal stump.  Upon visualization with the proctoscope, there   was no evidence of an anterior rectal mass and it was felt that the  rectal mass was removed with the specimens.  At this point the EA  stapler was brought from below and transanally inserted, the spike of  which was advanced just ventral to the staple line.  The anvil was  attached.  The EA staple was closed in the standard fashion and fired.  It was removed.  The doughnuts were inspected.  There were good  doughnuts noted on the specimens.  These were added to the specimens on  the back table to be sent to pathology.  At this point the staple  anastomosis was reinforced with 2-0 silks in simple interrupted fashion.  The pelvis was filled with irrigation fluid and the rectum was  insufflated while Dr. Lovell Sheehan occluded the proximal ileum.  Air was able  to be passed across the anastomosis.  There was no air leak.  At this  point the rectoscope was removed.  The irrigation was aspirated and at  this point attention was turned to helping anesthesia place an NG tube.  This was a difficult process secondary to the patient's small oropharynx  secondary to significant amount of soft tissue occlusion well known by  the patient's history of obstructive sleep apnea.  The NG tube was  eventually passed into the stomach and confirmed with palpation of the  stomach by myself.  At this point the abdomen was irrigated with copious  amounts of sterile saline as well as irrigated with gentamicin  irrigation.  Hemostasis was good.  The anastomosis was intact and the  surrounding bowel was viable.  At this point a 12 flat [name] drain was  placed through a previous 10 mm trocar site in the left lateral  abdominal wall.  The drain was placed near the anastomosis in the pelvis  and the drain was secured to the skin with a 2-0 nylon and at this point  attention was turned to closure.  An 0 looped Novafil suture x2 was  utilized to reapproximate the anterior fascia.  The initial one was  started inferiorly and  was brought to the midportion of the incision.  The second 0 looped Novafil was started superiorly and was brought to  the first.  These were secured to each other.  The tails of the suture  were then tucked into the deep subcuticular tissue.  Wide skin staples  were then utilized to reapproximate the skin edges after the deep  subcuticular tissue was irrigated again with gentamicin irrigation.  At  this point sterile dressings were placed over the midline incision and  the drain.  Half inch Steri-Strips were placed over the prior right  lateral abdominal wall trocar sites for which the staples had been  removed.  At this point the drapes were removed,  the dressings were secured and the patient was transferred to the  intensive care unit for recovery in stable condition for close  monitoring.  At the conclusion of the procedure, all needle, sponge, and  instrument counts correct.  The patient tolerated the procedure well.      Tilford Pillar, MD  Electronically Signed     BZ/MEDQ  D:  04/22/2007  T:  04/22/2007  Job:  [id number]   cc:   Patrica Duel, M.D.  Fax: [phone]   R. Roetta Sessions, M.D.  [id number]  [place]  [place] [address]

## 2010-10-12 NOTE — Op Note (Signed)
NAMEKULLEN, TOMASETTI               ACCOUNT NO.:  [id number]   MEDICAL RECORD NO.:  [id number]          PATIENT TYPE:  INP   LOCATION:  IC10                          FACILITY:  [facility]   PHYSICIAN:  Tilford Pillar, MD      DATE OF BIRTH:  08-19-1950   DATE OF PROCEDURE:  04/22/2007  DATE OF DISCHARGE:                               OPERATIVE REPORT   PREOPERATIVE DIAGNOSIS:  1. Intra-abdominal fluid collection (suspected intra-abdominal      abscess/anastomotic leak).  2. Rectal mass (adenocarcinoma).   POSTOPERATIVE DIAGNOSIS:  1. Intra-abdominal fluid.  2. Rectal adenocarcinoma.   PROCEDURE:  1. Low anterior resection with primary anastomosis.  2. Sigmoidoscopy.  3. Left subclavian vein triple lumen catheter placement.  4. Intra-abdominal drain placement.   SURGEON:  Tilford Pillar, M.D. and Dalia Heading, M.D.   ANESTHESIA:  General endotracheal.   ESTIMATED BLOOD LOSS:  250 mL.   FLUIDS REPLACED:  2500 mL crystalloid.   URINE OUTPUT:  450 mL.   SPECIMENS:  1. Polyps from the rectum.  2. Distal ileum.  3. Anastomotic tissue donuts from EEA stapler.   INDICATIONS FOR PROCEDURE:  The patient is a 60 year old who fell with a  complicated medical history, who underwent subtotal colectomy for a  hepatic flexure neoplasm as well as rectosigmoid neoplasm.  Following  the procedure he had a slow progression and continuous postoperative  course on the first and second postoperative days with respiratory  pulmonary issues secondary to his obstructive sleep apnea resulting in  hypoxia.  He eventually had __________  of his postoperative recovery  and was transferred to telemetry floor at which point he started to  develop increasing nausea, some vomiting, and more stable diabetic  control.  He had a temporary increase in his white blood cell count  along with the continued nausea and persistent hiccoughs, it was  suspected that the patient may be developing an intra-abdominal  abscess  or worrisome for anastomotic leak.  CT evaluation was obtained which  demonstrated a large fluid collection.  Additionally during this time  final pathology came back demonstrating the hepatic flexure neoplasm  that was not consistent with the preoperative colonoscopy biopsies of  the rectosigmoid lesion.  Therefore, it was suspected that the patient  also had a lesion in the rectum.  It was discussed at length with the  patient and the patient's family, recommendations for return to the  operating room for evaluation and drainage of the fluid as well as  additional rectal resection.  The patient was initially hesitant about  returning to the operating room but the benefits being discussed at  length as well as the risks, the patient was in favor of proceeding with  operative intervention.  The risks of infection, bleeding, bowel injury,  possible requirement of __________ ostomy as well as the possibility of  intraoperative cardiac or pulmonary events were discussed at length with  the patient.  He consented for the planned procedure.  I explained to  the patient that this would be an open low anterior resection and with  possible ostomy  placement and __________  intra-abdominal drain  placement.   DESCRIPTION OF PROCEDURE:  The patient was taken to the operating room.  He was positioned in a supine position on the operating table at which  time a general anesthetic was administered.  The patient was __________  .  At this point he had a left subclavian vein intravenous catheter  placed by myself using sterile Seldinger technique.  The triple lumen  catheter was inserted without difficulty with good venous return in all  three ports.  This was suture secured to the skin with 2-0 silk, a  sterile dressing placed.  At this point the initial drapes from the  __________  procedure were removed and a portable chest x-ray was  obtained prior to proceeding with any additional  procedures.  The result  of this showed good central venous placement and no pneumothorax.  At  this point continued __________  No evidence of any perforation or  dehiscence of the anastomosis were noted.  The anastomosis was intact  circumferentially.  At this point, slow removal of the scope, the  anterior rectal mass was identified and approximately __________ .      Tilford Pillar, MD  Electronically Signed     BZ/MEDQ  D:  04/22/2007  T:  04/22/2007  Job:  [id number]

## 2010-10-12 NOTE — Op Note (Signed)
[name], [name]               ACCOUNT NO.:  [id number]   MEDICAL RECORD NO.:  [id number]          PATIENT TYPE:  AMB   LOCATION:  DAY                           FACILITY:  [facility]   PHYSICIAN:  R. Roetta Sessions, M.D. DATE OF BIRTH:  1950-08-16   DATE OF PROCEDURE:  03/14/2007  DATE OF DISCHARGE:                               OPERATIVE REPORT   PROCEDURE PERFORMED:  Esophagogastroduodenoscopy with biopsy.   INDICATIONS FOR PROCEDURE:  [name] is a very pleasant 60-  year-old Caucasian male sent to me Dr. Yetta [name] for further  evaluation for profound iron-deficiency anemia.   This gentleman underwent a colonoscopy by Dr. Lovell [name] last fall for  screening although he had a microcytic anemia that time.  He was found  to have left colon polyps.  It turned out they have relatively high-  grade pathology, in fact they were tubulovillous adenomas.  One polyp  contained carcinoma in situ.  There are no other lesions reported.  Unfortunately his IDA has progressed.  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  On  March 12, 2007 through Dr. Edison [name] office, he had an hemoglobin and  hematocrit 4.1 and 17 with an MCV of 60.  He remains an outpatient and  has been scheduled see Dr. Mariel [name] for parenteral iron transfusion.   He has not had any blood transfusions, positive family history colon  cancer in his dad.   Really does not have the upper GI tract symptoms such as odynophagia,  dysphagia, early satiety or reflux symptoms, nausea or vomiting, has not  had any melena or hematochezia.  When questioned closely, he does have  significant dyspnea on exertion, easy fatigue.  He denies any upright  dizziness.  EGD is now being done and this approach has discussed with  the patient at length.  Potential risks, benefits and have been  reviewed, questions answered, he is agreeable.  Please see documentation  in the  medical record.   PROCEDURE NOTE:  O2 saturation, blood pressure, pulse and respirations  were monitored throughout the entire procedure.   CONSCIOUS SEDATION:  Versed 2 mg IV, Demerol 50 mg IV in a single dose.  Cetacaine spray for topical pharyngeal anesthesia.   INSTRUMENT:  Pentax video chip system.   FINDINGS:  Examination of the tubular esophagus revealed no mucosal  abnormalities.  EG junction easily traversed.  Stomach:  Gastric cavity was empty. It insufflated well with air.  Thorough examination of gastric mucosa including retroflex view of the  proximal stomach, esophagogastric junction demonstrated small hiatal  hernia.  The patient had gastric polypoid lesions in the antrum.  There  were a good five to six and two them were relatively large and being  approximately 1-1.5 cm in dimensions.  They appeared to be more  submucosal with a slight reddened, depressed volcano-like tip.  Please  see photos.  The larger polyps were fairly hard to palpation.  This did  not appear to be an out and out infiltrating process.  Pylorus patent,  easily traversed.  Examination of the bulb, second, and third portion  revealed no abnormalities.   THERAPEUTIC/DIAGNOSTIC MANEUVERS PERFORMED:  1. Biopsies of the third and second portion of the duodenum were taken      for histologic study.  Subsequently overlying mucosa over the      larger polypoid lesions in the antrum were biopsied for histologic      study.  The patient tolerated procedure well, was reacted in      endoscopy.   IMPRESSION:  1. Normal esophagus.  2. Small hiatal hernia.  Gastric polypoid lesions, the larger appeared      more submucosal had central volcano-like depression somewhat      suspicious for leiomyoma for GIST.  They were firm to biopsy      forceps palpation. They were biopsied.  Otherwise normal stomach,      normal  D1 through D3, biopsied D2 to D3.   It is possible that the polypoid lesions in the stomach  represent at  least one leiomyoma which could account for iron deficiency anemia via  mechanism of occult GI bleeding.  However before pursuing any further  diagnostic studies,  I feel he needs to have his colon reexamined  forthwith, given progress of iron-deficiency anemia and the relatively  high-grade pathology found in polyps removed one year ago.   In addition, this gentleman needs to be transfused.   RECOMMENDATIONS:  Will arrange for 2 unit transfusion of packed red  blood cells tomorrow in the specialty clinic and then will move forward  toward a diagnostic colonoscopy on March 16, 2007.   I have instructed Mr. [name] he should not drive or operating machinery  until his anemia is improved as he does have some symptoms related to  anemia and operating machinery and/or driving could be hazardous.  Further recommendations to follow.   I would like to thank Dr. Yetta [name] for allowing me to see this nice  gentleman today.      [name], M.D.  Electronically Signed     RMR/MEDQ  D:  03/14/2007  T:  03/15/2007  Job:  [id number]   cc:   [name]. [name] Sleet, MD  Fax: [phone]   [name], M.D.  Fax: [phone]

## 2010-10-12 NOTE — Op Note (Signed)
NAMELOC, FEINSTEIN               ACCOUNT NO.:  [id number]   MEDICAL RECORD NO.:  [id number]          PATIENT TYPE:  INP   LOCATION:  IC10                          FACILITY:  [facility]   PHYSICIAN:  Tilford Pillar, MD      DATE OF BIRTH:  11/14/1950   DATE OF PROCEDURE:  04/22/2007  DATE OF DISCHARGE:                               OPERATIVE REPORT   CONTINUATION AT POINT OF SIGMOIDOSCOPY:  At this point, __________ made for continuing __________ and subsequent  operation.   At this point, the __________   DICTATION ENDED AT THIS POINT   POOR Phone connection during dictation:  please see remainder of  dictation for operative procedure      Tilford Pillar, MD  Electronically Signed     BZ/MEDQ  D:  04/22/2007  T:  04/22/2007  Job:  [id number]

## 2010-10-12 NOTE — H&P (Signed)
[name], [name]               ACCOUNT NO.:  [id number]   MEDICAL RECORD NO.:  [id number]          PATIENT TYPE:  AMB   LOCATION:  DAY                           FACILITY:  [facility]   PHYSICIAN:  Tilford Pillar, MD      DATE OF BIRTH:  1951-02-08   DATE OF ADMISSION:  DATE OF DISCHARGE:  LH                              HISTORY & PHYSICAL   CHIEF COMPLAINT:  Rectal adenocarcinoma.   HISTORY OF PRESENT ILLNESS:  The patient is an unfortunate 60 year old  male well known to myself with recent colon resection for rectal  adenocarcinoma.  He had undergone extensive evaluation and had also had  a right hepatic flexure mass which was recommended to be excised based  on endoscopic evaluation by Gastroenterology.  Therefore, patient has  had a near total colectomy for the mass.  His last operation went  without complications or difficulties.  Patient had a slow recovery but  is recovering well.  At this point, he has been evaluated by Dr.  [name] with recommendations for port placement for a course of  chemotherapy.  At this point, the patient has had improving bowel  function with increasing solid stools.  He has also noted increase in  his energy, he has had no fever or chills.   PAST MEDICAL HISTORY:  Patient has history of:  1. Renal calculi.  2. Anemia.  3. Coronary artery disease.  4. Hypercholesterolemia.  5. Diabetes mellitus type 2.   PAST SURGICAL HISTORY:  1. He has had coronary artery stents and renal stents placed.  2. Additionally, he has had above-mentioned colon resection on      April 22, 2007.   Medications were reviewed with the patient.   ALLERGIES:  DAYPRO CAUSING NAUSEA AND VOMITING.   SOCIAL HISTORY:  1. No tobacco.  2. No alcohol use.  3. No recreational drug use.   REVIEW OF SYSTEMS:  No significant recent weight changes.  Denies any  chest pain or shortness of breath.  Abdominal pain has resolved status  post operation and bowel function as per  HPI.   PHYSICAL EXAM:  Patient is a morbidly obese male in no acute distress.  His pupils are equal, round, reactive, extraocular movements intact, no  conjunctival pallor is noted.  Oral mucosa is pink and moist.  Trachea  is midline.  No cervical lymphadenopathy is apparent.  No  supraclavicular or infraclavicular scars are noted.  Clavicles are  symmetrical bilaterally.  No wheezing or crackles are noted on  respiration.  Breath sounds are clear to auscultation bilaterally.  CARDIOVASCULAR:  Regular rate and rhythm.  He has 2+ radial pulses  bilaterally.  ABDOMEN:  The abdomen is soft, obese, positive bowel sounds are easily  appreciated.  The incision is healing, there is still some serous  drainage from the superior aspect of the incision, this does not appear  to be purulent.  Incision is otherwise well healed.  Prior drain site  also has a small skin defect with minimal serous drainage, no purulence  noted, no erythema.  EXTREMITIES:  Warm and dry.  ASSESSMENT AND PLAN:  Status post colon resection for rectal carcinoma  requiring IV access for continued chemotherapy, risks, benefits and  alternatives of which were discussed at length with the patient  including the risk of bleeding, infection which may require port  removal, as well as possible pneumothorax.  Patient will be scheduled  for Port-A-Cath placement at patient's earliest convenience as well as  at the discretion of Oncology upon nearing initiation of his  chemotherapy regimen.      Tilford Pillar, MD  Electronically Signed     BZ/MEDQ  D:  06/04/2007  T:  06/04/2007  Job:  [id number]   cc:   [facility]  Fax: [phone]   [name]. [name], MD  Fax: [phone]   Patrica Duel, M.D.  Fax: [phone]

## 2010-10-12 NOTE — Op Note (Signed)
[name], [name]               ACCOUNT NO.:  [id number]   MEDICAL RECORD NO.:  [id number]          PATIENT TYPE:  INP   LOCATION:  IC07                          FACILITY:  [facility]   PHYSICIAN:  Tilford Pillar, MD      DATE OF BIRTH:  10-Oct-1950   DATE OF PROCEDURE:  04/13/2007  DATE OF DISCHARGE:                               OPERATIVE REPORT   PREOPERATIVE DIAGNOSIS:  Colonic carcinoma.   POSTOPERATIVE DIAGNOSIS:  Colonic carcinoma.   PROCEDURE:  Subtotal colectomy with EEA staple anastomosis.   SURGEON:  Tilford Pillar, M.D.   ASSISTANT:  Dalia Heading, M.D.   ANESTHESIA:  General endotracheal.   ESTIMATED BLOOD LOSS:  200 ml.   FLUIDS:  Crystalloid 4 liters.   URINE OUTPUT:  200 ml.   SPECIMENS:  Colon and terminal ileum.   CONDITION:  Transfer to intensive care unit for close monitoring for  history of sleep apnea.   INDICATIONS:  Patient is a 60 year old male with a history of  significant anemia.  During his extensive workup, he had a colonoscopy  by Dr. Jena Gauss which demonstrated a polyp in the rectum as well as a  villous adenoma in the hepatic flexure.  The polyp in the sigmoid colon  did contain adenocarcinoma.  The villous adenoma was concerning in its  appearance.  It was recommended that this be removed.  The risks,  benefits and alternatives of the subtotal laparoscopic colectomy versus  an open subtotal colectomy were discussed at length with the patient.  The patient's questions and concerns were addressed.  The patient was  consented for the planned procedure after being, again, discussed the  risks, including but not limited to bleeding, infection, anastomotic  leak, as well as the possibility of intraoperative cardiac or pulmonary  events.  He was consented for the planned procedure.   OPERATION:  Patient was taken to the operating room and was placed in a  supine position on the operating room table, at which time the general  anesthetic was  administered.  Once the patient was asleep, he was  endotracheally intubated by anesthesia.  At this point, a Foley catheter  was placed in a sterile fashion by myself.  This was positioned without  difficulty.  There was scant urine, which was somewhat cloudy.  Despite  this, the patient's perineum and abdomen were prepped and draped in the  usual fashion.  An infraumbilical incision was created with carrying  this up to the left lateral of the umbilicus.  With the scalpel,  additional dissection down through the subcuticular tissue was carried  out, using electrocautery, including division of the anterior fascia.  This was then grasped with Kocher clamps and was utilized to elevate up  into the field.  Kelly clamps were used to grasp the peritoneum and  elevate this.  There was no bowel pinching at the peritoneum.  Electrocautery was utilized to create a defect.  Upon entering into the  peritoneal cavity, the incision was further elongated in order to  adequately fit my hand.  With this completed, the hand port was  placed.  Then an 11 mm trocar was placed in the right lower abdomen.  This was  placed using my hand to protect the underlying bowel.  It was passed  through the anterior abdominal wall.  At this point, pneumoperitoneum  was established.  The laparoscope was inserted.  There was an extensive  amount of intra-abdominal fat and some adhesions in the left lower  quadrant along the sigmoid colon.  At this point, a 5 mm trocar was  placed in the suprapubic region.  At this point, the harmonic scalpel  was utilized to dissect the adhesions free on the left side of the  abdominal wall.  At this point, the colon was grasped and retracted  medially.  The white line of Toldt was identified, and the harmonic  scalpel was utilized to dissect the peritoneum off of the peritoneal  reflection.  Using a combination of blunt and harmonic scalpel  dissection, the sigmoid colon was freed from  the left lateral abdominal  wall up along the descending colon to the splenic flexure.  At this  point, there was significant omental adhesions, and dissection was  prolonged due to the excessive amount of intra-abdominal fat.  At this  point, attention was turned to freeing the cecum in the right colon with  the 30 degree angle scope utilized to help assist with the visualization  and the patient being placed into the left lateral decubitus position.  The harmonic scalpel was utilized to dissection the peritoneal  reflection on the right colon.  This was carried up to the hepatic  flexure.  At this point, continued dissection along the transverse  colon, dissecting the plane between the stomach and the transverse  colon.  Again, this was quite tedious due to the extensive amount of  intra-abdominal fat.  We then entered into the lesser sac and continued  dissection towards the splenic flexure.  At this point, continued  dissection up along the transverse colon, heading into the general  direction of the splenic flexure.  At this point, as progression slowed  again due to the omentum in this area, we then repositioned and attacked  the splenic flexure in a reverse direction from the left.  Again,  continued to use the harmonic scalpel.  Adhesions were actually freed  with mobilization of the splenic flexure finally complete.  I continued  dissecting the transverse colon medially, as there were extensive  omental attachments at this point.  The Ligasure was then brought to the  field, and it was utilized to assist with the division of the omentum  and freeing the gastrocolic ligament along the transverse colon.  At  this point, I felt that the colon was well mobilized circumferentially  and turned attention returning back to the pelvis, patient was placed in  a Trendelenburg position.  The sacral promontory was palpated.  The  rectum was elevated.  A window was created behind this.  An  endo-GIA 45  standard stapler was utilized to divide the colon.  With the colon  divided, we utilized the Ligasure to divide the mesocolon along the  length of the sigmoid colon, continuing up along the left colon and  across the transverse colon.  At this point, the colon was brought back  to the hand port.  The peritoneum was deflated, and at this point,  evaluation of the colon where several attachments of the hepatic region,  allowing the colon to be easily pulled down through the hand port  sites.  The colon was replaced back into the abdominal cavity, and the  pneumoperitoneum was reestablished.   Continuing with the Ligasure, the hepatic flexure was eventually freed  along the colonic mesentery in this area.  With hepatic flexure free,  the colon was pulled again through the hand port, and the remaining  dissection was carried out using extracorporal division of the right  colonic mesocolon.  The cecum was freed.  The distal portion of the  terminal ileum was also divided.  In order to have a portion of the  small bowel easily reach the pelvis, the endo-GIA stapler was utilized  to divide the small bowel.  At this point, the specimen was freed, and  the small bowel was stretched to measure the reach into the pelvis.  As  it was suspicious that there will still be some tension in this area,  approximately an additional 10 cm of small bowel was removed in order  for an easily floppy portion of the ileum to reach the rectum.  With  hemostasis obtained at this point, the stapled line of the distal  portion of the small bowel was grasped with Allis clamps.  An automatic  purse-stringer was utilized to place a purse-string suture.  The staple  line was removed using Mayo scissors.  Using EEA sizers, a 29 sizer was  able to pass down through the small bowel, as this is the smallest  stapling size.  I determined to go ahead and use the 29 EEA stapler.  The anvil was passed onto the field.   The anvil was placed into the  small bowel, and the purse-string was secured.  At this time, hemostasis  was again evaluated.  There was no evidence of any bleeding.  Using the  suction irrigator, return aspirate was clear.  At this point, I went  down below using the initial finger dilatation to dilate the anus and  rectum.  The EEA-29 sizer was then passed after being lubricated.  This  was easily passed up to the rectal stump.  The 29 EEA stapler was then  passed without difficulty.  The spike was then deployed.  The anvil was  placed, insuring that the small bowel was not twisted upon itself.  With  the small bowel lying in good position, I then closed the EEA stapler.  It should be noted that the first attempts to close, I pulled the spike  out of the anvil.  This was replaced, passing it through the same defect  in the rectum.  Again, the anvil was secured.  The stapler was closed  and fired in a standard fashion.  Upon removing from the rectum, there  were two complete donuts located on the EEA stapler.  Hemostasis was  good, and 3-0 silk sutures were utilized to imbricate the staple  anastomosis and to remove some of the potential tension from the staple  line.  With the anastomosis clamped distally, the pelvis was filled with  irrigation fluid, and the small bowel was milked.  There was audible  gurgling of gas passing through and past the anastomosis with the rectum  clearly dilating.  There were no air bubbles extruding from the staple  anastomosis.  At this point, the fluid was aspirated.  The pelvis was  irrigated.  The bowel was returned back into the abdomen with good  hemostasis.  Attention was then turned to closure.  The hand port was  removed.  The patient was returned  back to a flat position.  An L-looped  Novofil was utilized to reapproximate the fascia.  A 3-0 Vicryl was  utilized to reapproximate the deep subcuticular tissue, and skin staples  were utilized to  reapproximate the skin edges at the hand port as well  as all four remaining trocar sites.  The skin was washed and dried with  moist and dry towels.  The drapes were removed, and the patient was  transferred to the post anesthesia care unit.  At the conclusion of the  procedure, all instrument, sponge, and needle counts were correct.  The  patient tolerated the procedure well.      Tilford Pillar, MD  Electronically Signed     BZ/MEDQ  D:  04/13/2007  T:  04/13/2007  Job:  [id number]   cc:   R. Roetta Sessions, M.D.  [address]  [place]  [place] [address]   Dr. Phillips Odor

## 2010-10-12 NOTE — H&P (Signed)
[name], [name]               ACCOUNT NO.:  [id number]   MEDICAL RECORD NO.:  [id number]          PATIENT TYPE:  INP   LOCATION:  IC07                          FACILITY:  [facility]   PHYSICIAN:  Tilford Pillar, MD      DATE OF BIRTH:  20-Jan-1951   DATE OF ADMISSION:  DATE OF DISCHARGE:  LH                              HISTORY & PHYSICAL   CHIEF COMPLAINT:  Colon mass.   HISTORY OF PRESENT ILLNESS:  The patient is a 60 year old male with  several medical problems including coronary artery disease, diabetes -  type 2, hypercholesterolemia, and a significant history of anemia who  presents as a referral from Dr. Jena Gauss.  He had a recent colonoscopy  which demonstrated a polyp in the rectum as well as a sessile mass in  the hepatic flexure.  Due to the appearance of the sessile mass in the  hepatic flexure, he was initially referred here.  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  The  hepatic flexure biopsy showing tubulovillous adenoma.   PAST MEDICAL HISTORY:  1. Renal calculi.  2. Anemia.  3. Coronary artery disease.  4. Hypercholesterolemia.  5. Diabetes mellitus, type 2.   PAST SURGICAL HISTORY:  1. Coronary artery stenting.  2. Fragmentation of renal calculi.   MEDICATIONS:  Medications were reviewed with the patient at the time of  evaluation.  The patient is on aspirin, Lipitor, metoprolol, Norvasc and  diabetic medications, although the patient is unaware of the name.   ALLERGIES:  DAYPRO results in nausea and vomiting.   SOCIAL HISTORY:  No tobacco, no alcohol use, no recreational drug use.  Occupation:  Works in the [employer].   PERTINENT FAMILY HISTORY:  Father and mother both with colon cancer.   REVIEW OF SYSTEMS:  CONSTITUTIONAL:  Malaise and weakness.  EYES:  Unremarkable.  EARS/NOSE/THROAT:  Sinus drainage.  RESPIRATORY:  Unremarkable.  CARDIOVASCULAR:  Unremarkable.  GASTROINTESTINAL:  Unremarkable.  GENITOURINARY:  Unremarkable.  MUSCULOSKELETAL:  Weakness  and back pain.  SKIN:  Unremarkable.  ENDOCRINE:  No energy.  NEUROLOGIC:  Unremarkable.   PHYSICAL EXAMINATION:  VITAL SIGNS:  Reviewed.  GENERAL:  The patient is morbidly obese.  EARS/NOSE/THROAT:  Scalp no deformities, no masses.  EYES:  Pupils are equal, round, and reactive.  Extraocular movements are  intact.  There is no conjunctival pallor.  Hearing is not diminished on  evaluation.  Oral mucosa is pink.  Normal oral occlusion.  Trachea is  midline.  NECK:  No cervical lymphadenopathy is apparent.  PULMONARY:  Respirations are unlabored.  There are no wheezes.  Breath  sounds are clear bilaterally, although distant secondary to the  patient's size.  CARDIOVASCULAR:  Regular rate and rhythm.  No murmurs apparent, although  again limited due to the patient's size.  Pulses are 2 plus radial, 2  plus dorsalis pedis.  ABDOMEN:  Bowel sounds are present.  Abdomen is soft, morbidly obese.  There is no tenderness, no pain, no  hernias or masses are apparently on  physical examination.  EXTREMITIES:  No weakness, no deformities.  SKIN:  Warm and dry.  NEURO/PSYCH:  Normal speech and mood.   PERTINENT LABORATORY AND RADIOGRAPHIC STUDIES:  CT of the abdomen and  pelvis showed no evidence of metastatic disease.  Colonoscopy again was  reviewed, again as mentioned previously biopsies demonstrated  tubulovillous adenoma in the hepatic flexure region and a rectal biopsy  demonstrated invasive adenocarcinoma.   ASSESSMENT/PLAN:  1. Colonic adenocarcinoma.  After initial evaluation, the patient was      sent for cardiac evaluation and for medical clearance, since that      time he has been cleared by Dr. Dietrich Pates on March 29, 2007, with      plans to continue beta-blockade and to resume aspirin following the      procedure.  The risks, benefits, and  alternatives of laparoscopic,      possible open, subtotal colectomy were discussed at length with the      patient and the patient's wife including, but not limited to, the      risk of bleeding, infection, bowel injury and possible need for      colostomy, as well as the possibility of intraoperative cardiac or      pulmonary events, and the possibility of death.  The patient was      instructed to not eat or drink anything after midnight the night      prior to the operation.  He was also instructed to start a clear      liquid diet the day before the operation.  The patient will be      consented for the planned procedure and we will proceed.  2. Also, it was discussed at length with the patient the high      suspicion for sleep apnea based on the patient's size and his      inability to complete a sleep study.  As he has not completed a      sleep study, he has never been      confirmed to have sleep apnea but again due to the patient's size      and history, it is suspicious that he does have sleep apnea;      therefore, the likely use of CPAP during his recovery period was      discussed with the patient.  This will be considered on an as      needed basis during his recovery period with close monitoring.      Tilford Pillar, MD  Electronically Signed     BZ/MEDQ  D:  04/06/2007  T:  04/06/2007  Job:  [id number]   cc:   R. Roetta Sessions, M.D.  [address]  [place]   [address]   [facility]  Fax: [phone]

## 2010-10-12 NOTE — Op Note (Signed)
[name], [name]               ACCOUNT NO.:  [id number]   MEDICAL RECORD NO.:  [id number]          PATIENT TYPE:  AMB   LOCATION:  DAY                           FACILITY:  [facility]   PHYSICIAN:  R. Roetta Sessions, M.D. DATE OF BIRTH:  01/25/1951   DATE OF PROCEDURE:  03/16/2007  DATE OF DISCHARGE:  03/16/2007                                PROCEDURE NOTE   PROCEDURE:  Colonoscopy, snare polypectomy and biopsy.   ENDOSCOPIST:  [name], M.D.   INDICATIONS FOR PROCEDURE:  A 60 year old gentleman with a greater than  1-year history of profound iron-deficiency anemia.  He was found have  tubulovillous adenoma with high-grade dysplasia on colonoscopy 1 year  ago.  His IDA has progressed to the point where he was transfused and  given parenteral iron yesterday by Dr. [name].  Hemoglobin early this  week with 4.1.   He underwent an EGD 2 days ago, performed by me, which revealed firm  submucosal volcano-appearing lesions in his antrum suspicious for  leiomyoma, biopsies pending.  Otherwise, upper GI tract yielded no  significant abnormalities.  Repeat colonoscopy is now being done, given  the clinical scenario and the relatively high-grade pathology seen on  prior colonoscopy.  This approach has been discussed with [name] at  length.  Potential risks, benefits and alternatives have been reviewed  and questions answered; he is agreeable.  Please see the documentation  in the medical record.   PROCEDURE NOTE:  O2 saturation, blood pressure, pulse and respirations  were monitored throughout the entire procedure.   CONSCIOUS SEDATION:  Versed 4 mg IV, Demerol 75 mg IV divided doses.   INSTRUMENT:  Pentax video chip system.   DIGITAL RECTAL EXAM:  Exam revealed no abnormalities.   ENDOSCOPIC FINDINGS:  The prep was good.  Examination of the rectal  mucosa including a retroflex view of the anal verge demonstrated an  infiltrating semilunar hard neoplastic process beginning  at 15 cm from  the anal verge.  It involved almost half the circumference of the rectal  mucosa.  It was approximately 5 cm in length; please see multiple  photos.  The remainder of the rectal mucosa distally appeared normal.  This lesion was biopsied multiple times during.   COLON:  Colonic mucosa was surveyed from the rectosigmoid junction on up  around to the cecum.  The cecum, ileocecal valve and appendiceal orifice  were well seen and photographed for the record.  The patient had  multiple pedunculated 5-mm polyps about the cecum and hepatic flexure,  which were cold-snared.  There was diminutive polyp in the ascending  just distal to the ileocecal valve which was cold-biopsied/removed.   There was a second neoplastic-appearing process partially obscured  between 2 folds at the hepatic flexure; it was approximately 3 x 4 cm in  dimensions; this appeared to be infiltrating.  I did inject 4 mL of  normal saline on the back side of this lesion.  It is notable that it  would not left away from the colonic wall with this maneuver.  It was a  very  difficult approach and I did not feel this was amenable to complete  resection endoscopically anyway.  Multiple biopsies were taken of this  lesion with the jumbo biopsy forceps.  The remainder of the colonic  mucosa was carefully inspected, both on the way in and out, and no other  lesions were seen.  The hepatic flexure lesion was tattooed for the  surgeon.  The patient tolerated her procedure very well and was reactive  after endoscopy.   IMPRESSION:  1. Bulky semilunar proximal rectal neoplasm, as described above; this      begins at 15 cm and it was not appreciable on rectal exam, status      post biopsy.  2. Multiple descending/cecal polyps removed with cold snare and/or      cold-biopsy removed.  3. Second flat infiltrating-appearing neoplastic process at the      hepatic flexure biopsied and tattooed.  I do not feel this lesion       is amenable for resection either.   RECOMMENDATIONS:  1. We will do a Chem-20 today and baseline CEA and abdominopelvic CT.  2. Surgical consult pending.  I have discussed my findings and      recommendations with Dr. Nobie Putnam, who is standing in for Dr.      Regino Schultze today.      [name], M.D.  Electronically Signed     RMR/MEDQ  D:  03/16/2007  T:  03/18/2007  Job:  [id number]   cc:   Kirk Ruths, M.D.  Fax: [phone]   [name]. [name], MD  Fax: [phone]

## 2010-10-12 NOTE — Letter (Signed)
March 29, 2007    Tilford Pillar, M.D.   RE:  VICK, FILTER  MRN:  [id number]  /  DOB:  11/07/1950   Dear Dr. Leticia Penna,   Mr. Chmiel is seen in the office today at your kind request prior to  planned resection of a colonic neoplasm.  His history of coronary  disease dates to 2003, when he presented with unstable angina.  He was  said to have single-vessel disease with multiple lesions in the  circumflex coronary artery.  After a complex intervention and stenting,  the appearance of the vessel was virtually normal.  He has had no chest  discomfort or dyspnea since.  Over the past year or so, he has noted  progressive malaise and fatigue.  He was found to have a severe  microcytic anemia and referred for GI evaluation.  At colonoscopy, a  neoplasm was identified.   Mr. Bells has hypertension that has been well controlled.  A recent  lipid profile reveals very low total cholesterol and LDL.  He smoked  cigarettes until the time of his coronary intervention, but not since.  Otherwise, he has been generally healthy.  He has had cataract  extraction as well as lithotripsy on a number of occasions for  nephrolithiasis.  He has mild diabetes controlled with oral agents.   SOCIAL HISTORY:  Works in [employer] for the [employer].  Married,  with four children.   FAMILY HISTORY:  Father died at age 36 with carcinoma of the colon;  mother is alive and well with hypertension.  He has two siblings, one of  whom has hypertension.   REVIEW OF SYSTEMS:  Notable for cataract extractions from both eyes, a  remote history of heart murmur, GERD symptoms, urinary frequency,  arthritic discomfort in the hands and knees, and mild edema of the feet  and ankles.  All other systems were reviewed and are negative.   PHYSICAL EXAMINATION:  GENERAL:  Pleasant gentleman in no acute  distress.  VITAL SIGNS:  Weight is 262, blood pressure 120/80, heart rate 60 and  regular, respirations 16.  HEENT:   Poor dentition.  EOMs full.  NECK:  No jugular venous distention.  Normal carotid upstrokes, without  bruits.  ENDOCRINE:  No thyromegaly.  HEMATOPOIETIC:  No adenopathy.  SKIN:  No significant lesions.  LUNGS:  Clear.  CARDIAC:  Normal first and second heart sounds; modest early systolic  ejection murmur.  ABDOMEN:  Soft and nontender.  No masses.  No organomegaly.  EXTREMITIES:  Normal distal pulses.  No edema.   EKG:  Normal sinus rhythm, within normal limits.  No prior tracing for  comparison.   IMPRESSION:  Mr. Wolke is asymptomatic, now five years following  complex coronary intervention.  There is no indication for additional  testing.  Current medications appear optimal.  Based upon his history of  coronary disease, he has some increased operative risk, but this is  modest and unavoidable with our current technology.  Beta blockers and  aspirin should be continued perioperative if feasible.  I will be  available to assist with his care in the hospital as needed.  I will  plan to continue to see this nice gentleman on an annual bases, but risk  factor control is optimal at the present time.    Sincerely,      Gerrit Friends. Dietrich Pates, MD, [facility]  Electronically Signed    RMR/MedQ  DD: 03/29/2007  DT: 03/30/2007  Job #: [id number]  CC:    Dr. Phillips Odor

## 2010-10-12 NOTE — Op Note (Signed)
[name], [name]               ACCOUNT NO.:  [id number]   MEDICAL RECORD NO.:  [id number]          PATIENT TYPE:  AMB   LOCATION:  DAY                           FACILITY:  [facility]   PHYSICIAN:  Tilford Pillar, MD      DATE OF BIRTH:  1950-10-26   DATE OF PROCEDURE:  06/06/2007  DATE OF DISCHARGE:                               OPERATIVE REPORT   PREOPERATIVE DIAGNOSIS:  Rectal cancer, requiring access for  chemotherapy.   POSTOPERATIVE DIAGNOSIS:  Rectal cancer, requiring access for  chemotherapy.   PROCEDURE:  1. Left subclavian vein Port-A-Cath placement.  2. Intraoperative fluoroscopy with interpretation.   SURGEON:  Tilford Pillar, M.D.   ANESTHESIA:  IV sedation with local anesthetic 1% lidocaine plain.   SPECIMENS:  None.   ESTIMATED BLOOD LOSS:  Minimal.   INDICATIONS:  Patient is a 60 year old male well known to me with a  history of rectal adenocarcinoma.  He has undergone his previous  operations with a near total colectomy for a rectal mass and a hepatic  flexure mass.  He has recovered well from this and is now being  evaluated for initiation of radiation and chemotherapy.  At this point,  the risks, benefits and alternatives of a subcutaneous port, including  the risks of infection, bleeding, and pneumothorax were discussed with  the patient as well as the possibility of intraoperative cardiac or  pulmonary episodes.  Consent was obtained.   PROCEDURE:  Patient was taken to the operating room and was placed in a  supine position on the operating room table, at which time sedation was  administered.  Once the patient was asleep and sedated, a roll towel was  placed between the scapulae in order to facilitate proper positioning  for placement of the catheter.  His left shoulder, neck, and chest were  then prepped in a standard fashion.  Sterile drapes were placed.  At  this point, the 1% lidocaine was injected along the planned tract of  venipuncture.  At this  point, using an 18 gauge introducer needle, the  subclavian vein was identified.  Initial attempts to advance the J wire  were met with movement of the J wire up the internal jugular vein on  fluoroscopy.  Additional attempts to manipulate with the current stick  were unsuccessful in allowing passage of the catheter into the superior  vena cava.  Therefore, the initial attempt was halted, and a new stick  was attempted with a different angulation.  At this point, the  subclavian vein again was identified with good venous return.  The J  wire was advanced, this time without any difficulties.  The fluoroscopy  clearly was seen entering into the subclavian vein.  At this point, the  wire was clipped to the drapes with a hemostat.  Attention was turned to  creating the subcuticular pocket.   Local anesthetic was then injected at the plain site in the pocket as  well as the planned tract of the subcuticular tunnel to the stab wound  for the venipuncture.  At this point, a scalpel was utilized  to create  the initial incision for the pocket, and additional dissection was  carried out using the electrocautery down to the prepectoralis fascia.  The pocket was enlarged inferiorly using a combination of blunt and  electrocautery dissection.  Hemostasis was excellent.  At this point,  the subcuticular tunneler was used to pass the catheter from the pocket  incision to the venipuncture stab incision with the catheter in place.  The dilator and insertion sheath were then advanced over the wire.  The  wire and the dilator were removed.  The catheter was advanced down  through the insertion sheath and using the standard splitting technique,  the sheath was removed.  The catheter was positioned, and fluoroscopy  was utilized to confirm that no kinking was noted along the course of  the catheter.  The catheter was pulled back to be just at the superior  vena cava at the right atrium.  The end of the  catheter was trimmed to  the appropriate length.  It was secured to the port, which was then  tacked at the pectoralis fascia with 3-0 Vicryl sutures x2.  At this  point, the fluoroscopy was once again utilized to confirm that no  kinking along the course of the catheter was noted.  At this point, the  catheter was accessed through the port with good venous return and  easily flushed.  Heparinized saline was then instilled into the  catheter.  Using the 3-0 Vicryl, the deep subcuticular tissue was  reapproximated over the catheter in a running, continuous fashion.  The  4-0 Monocryl was then utilized to reapproximate the skin edges in a  running, subcuticular suture.  The skin was then washed and dried with a  moist-dry towel.  Benzoin was placed over the incision, both the port  incision as well as the stab incision.  At this point, half inch Steri-  Strips were placed over the incisions.  The drapes were removed.  The  patient was allowed to come out of general anesthetic.  The patient  tolerated the procedure well and was transferred to the post anesthesia  care unit in stable condition.  At the conclusion of the procedure, all  sponge, instrument and needle counts were correct.  The patient  tolerated the procedure well.  To note, at the conclusion in the PACU, a  portable chest x-ray was obtained which confirmed good placement of the  catheter as well as no evidence of pneumothorax.      Tilford Pillar, MD  Electronically Signed     BZ/MEDQ  D:  06/06/2007  T:  06/06/2007  Job:  [id number]   cc:   [name]. [name], MD  Fax: [phone]   Patrica Duel, M.D.  Fax: [phone]

## 2010-10-12 NOTE — Op Note (Signed)
NAMEELLWYN, ERGLE               ACCOUNT NO.:  [id number]   MEDICAL RECORD NO.:  [id number]          PATIENT TYPE:  AMB   LOCATION:  DAY                           FACILITY:  [facility]   PHYSICIAN:  R. Roetta Sessions, M.D. DATE OF BIRTH:  [birth year]   DATE OF PROCEDURE:  03/25/2008  DATE OF DISCHARGE:                               OPERATIVE REPORT   PROCEDURE:  Sigmoidoscopy with biopsy.   INDICATIONS FOR PROCEDURE:  The patient is a pleasant 60 year old  gentleman who underwent low anterior resection for a proximal rectal  cancer last year.  Unfortunately, the initial resection did not include  the rectal tumor.  Postoperative course was complicated by anastomotic  leak for which a second surgery was performed, this removed the rectal  carcinoma.   He had been seen by Dr. [name].  He has been on chemotherapy, but he  has developed significant thrombocytopenia which was complicated by  chemotherapy.  Dr. [name] has data quantifying an enlargement of his  spleen over time.  We saw him in consultation recently and performed an  MRI of the abdomen.  Dr. Eppie Gibson interpreted his liver and spleen to  appear completely normal without any evidence of metastatic disease.  Mr. Gomillion has really done well clinically from the standpoint of his  surgery.  Certainly, he does have a diarrhea now days and takes Lomotil  and Imodium p.r.n. with fairly good results.  Of note, he does have  cholelithiasis on prior MRI.  He is here for his surveillance  examination of his residual lower GI tract.  This approach has discussed  with the patient at length.  Risks, benefits, and alternatives have been  reviewed.  Please see the documentation in the medical record.   PROCEDURE NOTE:  O2 saturation, blood pressure, pulse of the patient  monitored throughout the entire procedure.  Conscious sedation, Versed 4  mg IV Demerol 100 g IV in divided doses.   INSTRUMENTATION:  Pentax video chip system.   FINDINGS:  Digital rectal exam revealed no abnormalities.  Endoscopic  findings, the prep was adequate.  Examination of the distal rectum  revealed a surgical anastomosis with small bowel at 12 cm from the anal  verge.   This anastomosis was observed to be fairly close to the location of the  tumor, found last year, suture material was protruding from the  anastomosis.  Anastomosis was patent.  I was able to go up to 15-20 cm  in the small bowel, which appeared normal.  I pulled the scope back to  examine the anastomosis carefully, and the tissue around the anastomosis  was somewhat friable and a little bumpy/ adenomatous appearing between  two areas of suture which may just be postoperative fibrotic changes.  This area was biopsied for the pathologist.  From the anastomosis  distally, the residual rectal mucosa appeared entirely normal.  The  rectal vault was too small to retroflex, but for the same reason I was  able to see the residual rectal mucosa very well and found that as  stated.  The patient tolerated the procedure well  and was reactive in  endoscopy.   IMPRESSION:  1. Status post a colectomy with 12 cm of residual rectum, remaining      surgical anastomosis was identified.  It was patent, distal small      bowel appeared more normal.  2. Subtle mucosal layer irregularity at the anastomosis which may be a      normal variant approximately 1 year postoperatively status post      biopsy.   RECOMMENDATIONS:  1. Continue Lomotil/Imodium p.r.n. diarrhea.  2. Follow-up on path.  3. As far as his liver and spleen are concerned, on MRI, Dr. Eppie Gibson      interpreted the organs to be normal in appearance.  We would like      to review this study with him as soon as that can be arranged.  MRI      findings will be reassuring if the spleen has enlarged.  He could      have had some degree of splenic sequestration to account for his      diminished platelet count.  4. Further  recommendations to follow.       [name], M.D.  Electronically Signed     RMR/MEDQ  D:  03/25/2008  T:  03/25/2008  Job:  [id number]   cc:   [name]. [name], MD  Fax: [phone]   Kirk Ruths, M.D.  Fax: [phone]

## 2010-10-13 ENCOUNTER — Other Ambulatory Visit (HOSPITAL_COMMUNITY): Payer: Self-pay | Admitting: Oncology

## 2010-10-13 ENCOUNTER — Encounter (HOSPITAL_COMMUNITY)
Admission: RE | Admit: 2010-10-13 | Discharge: 2010-10-13 | Disposition: A | Discharge status: Home / Self Care | Payer: Managed Care, Other (non HMO) | Source: Ambulatory Visit | Attending: Oncology | Admitting: Oncology

## 2010-10-13 DIAGNOSIS — K8020 Calculus of gallbladder without cholecystitis without obstruction: Secondary | ICD-10-CM | POA: Insufficient documentation

## 2010-10-13 DIAGNOSIS — N281 Cyst of kidney, acquired: Secondary | ICD-10-CM | POA: Insufficient documentation

## 2010-10-13 DIAGNOSIS — I2510 Atherosclerotic heart disease of native coronary artery without angina pectoris: Secondary | ICD-10-CM | POA: Insufficient documentation

## 2010-10-13 DIAGNOSIS — J984 Other disorders of lung: Secondary | ICD-10-CM | POA: Insufficient documentation

## 2010-10-13 DIAGNOSIS — R911 Solitary pulmonary nodule: Secondary | ICD-10-CM

## 2010-10-13 DIAGNOSIS — Z85038 Personal history of other malignant neoplasm of large intestine: Secondary | ICD-10-CM

## 2010-10-13 DIAGNOSIS — N200 Calculus of kidney: Secondary | ICD-10-CM | POA: Insufficient documentation

## 2010-10-13 DIAGNOSIS — Z980 Intestinal bypass and anastomosis status: Secondary | ICD-10-CM | POA: Insufficient documentation

## 2010-10-13 MED ORDER — FLUDEOXYGLUCOSE F - 18 (FDG) INJECTION
16.3000 | Freq: Once | INTRAVENOUS | Status: AC | PRN
  Administered 2010-10-13: 16.3 via INTRAVENOUS

## 2010-10-15 ENCOUNTER — Other Ambulatory Visit (HOSPITAL_COMMUNITY): Payer: Self-pay | Admitting: Oncology

## 2010-10-15 ENCOUNTER — Encounter (HOSPITAL_COMMUNITY): Payer: Managed Care, Other (non HMO) | Admitting: Oncology

## 2010-10-15 DIAGNOSIS — C20 Malignant neoplasm of rectum: Secondary | ICD-10-CM

## 2010-10-15 LAB — CBC
HCT: 41.4 % (ref 39.0–52.0)
Hemoglobin: 14.9 g/dL (ref 13.0–17.0)
MCH: 30.1 pg (ref 26.0–34.0)
MCHC: 36 g/dL (ref 30.0–36.0)
MCV: 83.6 fL (ref 78.0–100.0)
Platelets: 146 K/uL — ABNORMAL LOW (ref 150–400)
RBC: 4.95 MIL/uL (ref 4.22–5.81)
RDW: 14.3 % (ref 11.5–15.5)
WBC: 9.8 K/uL (ref 4.0–10.5)

## 2010-10-15 LAB — PROTIME-INR
INR: 0.99 (ref 0.00–1.49)
Prothrombin Time: 13.3 s (ref 11.6–15.2)

## 2010-10-15 LAB — COMPREHENSIVE METABOLIC PANEL
CO2: 24 mEq/L (ref 19–32)
Calcium: 10.7 mg/dL — ABNORMAL HIGH (ref 8.4–10.5)
Creatinine, Ser: 2.26 mg/dL — ABNORMAL HIGH (ref 0.4–1.5)
GFR calc non Af Amer: 30 mL/min — ABNORMAL LOW (ref >60)
Glucose, Bld: 147 mg/dL — ABNORMAL HIGH (ref 70–99)

## 2010-10-15 LAB — DIFFERENTIAL
Basophils Absolute: 0 10*3/uL (ref 0.0–0.1)
Eosinophils Absolute: 0 10*3/uL (ref 0.0–0.7)
Eosinophils Relative: 0 % (ref 0–5)
Lymphocytes Relative: 6 % — ABNORMAL LOW (ref 12–46)
Neutrophils Relative %: 85 % — ABNORMAL HIGH (ref 43–77)

## 2010-10-15 LAB — IRON AND TIBC
Iron: 83 ug/dL (ref 42–135)
Saturation Ratios: 20 % (ref 20–55)
TIBC: 421 ug/dL (ref 215–435)
UIBC: 338 ug/dL

## 2010-10-15 LAB — FERRITIN: Ferritin: 135 ng/mL (ref 22–322)

## 2010-10-15 NOTE — Consult Note (Signed)
[name], [name]               ACCOUNT NO.:  [id number]   MEDICAL RECORD NO.:  [id number]          PATIENT TYPE:  AMB   LOCATION:  DAY                           FACILITY:  [facility]   PHYSICIAN:  Lionel December, M.D.    DATE OF BIRTH:  Feb 01, 1951   DATE OF CONSULTATION:  04/07/2006  DATE OF DISCHARGE:                                   CONSULTATION   GI CONSULTATION NOTE   REASON FOR CONSULTATION:  Microcystic anemia, family history of colon  cancer.   HISTORY OF PRESENT ILLNESS:  Mr. Wich is a 60 year old, Caucasian  gentleman who was recently found to have a microcytic anemia.  He has been  having right back pain/right flank pain and actually passed some kidney  stones about a month ago.  The pain persisted and he went to see his  physician and has had workup including non-contrast CT and abdominal  ultrasound.  We are trying to retrieve those results but, according to the  patient, they do not see any further stones and only saw a cyst in his  kidneys.  He had blood work done 2 days ago.  His hemoglobin was 9.4,  hematocrit 34.3, MCV 69.7.  White count and platelet counts were normal.  LFTs were normal.  He denies any blood in the stool or melena.  He has had  no problems with his bowels.  Denies any abdominal pain.  No nausea or  vomiting.  No heartburn, dysphagia, odynophagia, or weight loss.  He has  never had a colonoscopy.   CURRENT MEDICATION:  1. Mavik 8 mg daily.  2. Norvasc 5 mg daily.  3. Lipitor 10 mg nightly.  4. Aspirin 81 mg daily.  5. Metoprolol 50 mg b.i.d.  6. Glyburide/metformin 2.5/500 mg b.i.d.  7. Ibuprofen p.r.n.  8. Tylenol p.r.n.   ALLERGIES:  DAYPRO.   PAST MEDICAL HISTORY:  1. Hypertension.  2. Diabetes mellitus.  3. Nephrolithiasis, status post multiple lithotripsies in the past.  4. History of coronary artery disease and MI, status post 2 stents in      2003.  In 2006, stress test was unremarkable per patient.  5. He has had surgery on his  right elbow.   FAMILY HISTORY:  Father died with colon cancer at age 62.  He was diagnosed  2 years prior to his death, received surgery and chemotherapy.   SOCIAL HISTORY:  He is married.  Has 4 children.  He is employed doing tree  service.  He quit smoking 3 years ago when his father died.  He occasionally  consumes beer but not on a regular basis.  He has had no alcohol in over 3-4  weeks.   REVIEW OF SYSTEMS:  GI:  See HPI  CONSTITUTIONAL:  See HPI.  CARDIOPULMONARY:  He denies any chest pain or shortness of breath.  GU:  Denies any hematuria.   PHYSICAL EXAMINATION:  Weight 276.  Height 5 feet 11 inches.  Temp 97.7.  Blood pressure 130/80.  Pulse 60.  GENERAL:  A pleasant, obese, Caucasian male in no acute distress.  SKIN:  Warm and dry.  No jaundice.  HEENT:  Sclerae nonicteric.  Oropharyngeal mucosa moist and pink.  No  lesions, erythema, or exudate.  Dentition in poor repair.  SKIN:  Warm and dry. No jaundice.  CHEST:  Lungs were clear to auscultation.  CARDIAC:  Reveals a regular rate and rhythm.  No murmurs, rubs, or gallops.  ABDOMEN:  Positive bowel sounds, soft, nontender, nondistended, no  organomegaly or masses.  No rebound, tenderness, or guarding.  No abdominal  bruit or hernias.  EXTREMITIES:  No edema.   IMPRESSION:  Mr. Scoggins is a 60 year old gentleman recently found to have  microcytic anemia.  We have not received his anemia profile which has been  done.  He has had imaging studies which we have requested.  It appears that  he has iron-deficiency anemia.  He has a family history of colon cancer in  his father.  He needs to have a diagnostic colonoscopy at this time.  I  discussed risks, alternatives, and benefits to the patient and he agreeable  to proceed.   PLAN:  1. Colonoscopy and possible EGD in the near future by Dr. Karilyn Cota.  We will      hold aspirin 4 days prior to      procedure.  2. Review pending records when available.   I would like to  thank Dr. Assunta Found for allowing [place] to take part in the  care of this patient.      Tana Coast, P.A.      Lionel December, M.D.  Electronically Signed    LL/MEDQ  D:  04/07/2006  T:  04/07/2006  Job:  [id number]   cc:   Corrie Mckusick, M.D.  Fax: [phone]   Lionel December, M.D.  [address]  [place]  [place] [address]

## 2010-10-15 NOTE — Cardiovascular Report (Signed)
[place]  Patient:    [name], [name] Visit Number: [id number] MRN: [id number]          Service Type: MED Location: [id number] Attending Physician:  [name] Dictated by:   [name], M.D., [facility] Proc. Date: 07/19/01 Admit Date:  07/19/2001 Discharge Date: 07/20/2001   CC:         [name], [facility]  [name], M.D., Elita Boone, M.D., [facility] Medical Records   Cardiac Catheterization  PROCEDURE:   Left heart catheterization and percutaneous coronary intervention.  CARDIOLOGIST:  [name], M.D., [facility]  INDICATIONS:  [name] is a 60 year old white male who was transferred from [facility] Emergency Room this morning after developing new onset of substernal chest pressure.  The patient first experienced chest pressure approximately one to two weeks ago while splitting wood, associated with shortness of breath.  This a.m., he was awakened at approximately 3:30 with chest burning, tightness, and shortness of breath, highly suggestive of possible unstable angina.  He was then transported to the [facility] Emergency Room.  He was transferred to [facility] for ultimate cardiac catheterization.  DESCRIPTION OF PROCEDURE:  After premedication with Versed intravenously, the patient was prepped and draped in the usual fashion. The right femoral artery was punctured anteriorly, and a 6-French sheath was inserted without difficulty.  Diagnostic catheterization was done with 6-French Judkins 4 left and right coronary catheters.  A 6-French pigtail catheter was used for biplane cine left ventriculography as well as distal aortography.  With the demonstration of high-grade multiple stenoses in the proximal circumflex coronary artery as well as in the A-V groove circumflex coronary artery, the decision was made to attempt  coronary intervention.  The arterial sheath was upgraded to a 7-French system.  Although the patient was known to have very trace guaiac-positive hemoccult, he denied any known history of active bleeding.   He did have stable hemoglobin on admission and hematocrit at 13.9 and 40.9, respectively.  Double bolus Integrilin and 5000 units of weight-adjusted heparinization was administered.  A Voda 3.5 left coronary guide was used for the left circumflex intervention.  A Patriot wire was advanced down the circumflex which was tortuous proximally with two discrete stenoses.  The wire was then navigated into the A-V groove circumflex vessel beyond the 90% stenosis at this site.  It was felt that cutting atherotomy was the technique to be used specifically for the A-V groove circumflex in light of its ostial location juxtaposed just beyond the takeoff of the large marginal vessel.  A 2.5 x 10 mm cutting balloon atherotomy catheter was then inserted.  Numerous inflations were made up to a maximum of 6 atmospheres in the A-V groove circumflex vessel.  The cutting balloon was then advanced proximally to the distal portion of the proximal circumflex, and several cuts were made at the more distal 80% stenosis in this proximal segment.  The atherotomy catheter was then advanced to the more proximal stenosis in the circumflex vessel, and several inflations were made.  A 3.5 x 24 mm Express 2 stent was then inserted with careful attention to make certain both lesions were covered.  This stent seemed to be just about exact size to cover both lesions.  This was maximally dilated up to 15 atmospheres corresponding to 3.8 mm vessel size.  Low-level dilatations were done to transition points both proximally  and distally.  Scout angiography confirmed an excellent result. ACT was documented and was therapeutic.  The arterial sheath was sutured in place with plans for sheath removal later today.  HEMODYNAMIC  DATA: 1. Central aortic pressure was 167/105. 2. Left ventricular pressure 167/22.  During the procedure, the patient received numerous doses of intracoronary nitroglycerin even prior to the intervention to make certain this was not spasm but represented fixed stenosis.  Numerous doses of intracoronary nitroglycerin were administered during the intervention.  The patient was also titrated on increasing doses of intravenous nitroglycerin for more optimal blood pressure control.  ANGIOGRAPHIC DATA:  Left main coronary artery was angiographically normal and bifurcated into an LAD and left circumflex system.  The LAD gave rise to two proximal diagonal vessels.  The mid LAD had scattered 20 to 30% irregularity without high-grade focal stenosis.  The circumflex vessel was a large caliber vessel in the proximal to proximal mid segment.  There was focal stenoses on the proximal bend of 80% followed by another 80% stenosis before the takeoff of a large marginal vessel.  The A-V groove circumflex had a 90% ostial stenosis just after the takeoff of this large marginal vessel.  The right coronary artery had diffuse 30% proximal stenosis.  There was 80% focal stenosis proximally in a small anterior RV branch.  There was 40% stenosis diffusely in the proximal segment of a PDA vessel of the distal right coronary artery.  The RCA ended in inferior LV branch and small posterolateral system.  BIPLANE CINE LEFT VENTRICULOGRAPHY revealed normal global LV function without focal segmental wall motion abnormalities.  DISTAL AORTOGRAPHY did not demonstrate any significant renal artery stenosis or aortoiliac disease.  INTERVENTION RESULTS:  Following coronary intervention in the circumflex coronary artery with cutting balloon atherotomy involving the mid A-V groove  circumflex as well as two sites in the proximal circumflex and ultimate stenting of the two sites in the circumflex with a 3.5 x 24 mm  Express 2 balloon, the proximal tandem 80% stenoses were reduced to 0%. The 90% stenosis treated with only cutting balloon intervention was reduced to less than 10%. All sites were smooth.  There was no evidence for dissection.  There was TIMI-3 flow.  IMPRESSION: 1. Normal left ventricular dysfunction. 2. Multivessel coronary obstructive disease with scattered 20 to 30% mid    left anterior descending artery stenoses, tandem 80% stenoses in the    proximal to proximal-mid portion of the circumflex coronary artery before    the obtuse marginal #1 vessel, with 90% focal stenosis in the mid    proximal portion of the mid atrioventricular groove circumflex, and 30%    proximal right coronary artery stenosis with 40% posterior descending    artery stenosis and 80% stenosis in a small anterior right ventricular    marginal branch. 3. Successful cutting balloon atherotomy of the atrioventricular groove    circumflex with percent diameter reduction from 90% to less than 10% and    successful combined cutting balloon atherotomy with stenting of tandem    lesions in the proximal to proximal-mid circumflex coronary artery with    ultimate stenting with a 3.5 x 24 mm Express 2 stent dilated to 3.8 mm,    done with double bolus Integrilin and weight-adjusted heparinization. Dictated by:   [name], M.D., [facility] Attending Physician:  [name] DD:  07/19/01 TD:  07/20/01 Job: [id number]

## 2010-10-21 ENCOUNTER — Encounter (INDEPENDENT_AMBULATORY_CARE_PROVIDER_SITE_OTHER): Payer: Managed Care, Other (non HMO) | Admitting: Cardiothoracic Surgery

## 2010-10-21 DIAGNOSIS — D381 Neoplasm of uncertain behavior of trachea, bronchus and lung: Secondary | ICD-10-CM

## 2010-10-22 ENCOUNTER — Other Ambulatory Visit: Payer: Self-pay | Admitting: Cardiothoracic Surgery

## 2010-10-22 DIAGNOSIS — R911 Solitary pulmonary nodule: Secondary | ICD-10-CM

## 2010-10-22 NOTE — H&P (Signed)
HISTORY AND PHYSICAL EXAMINATION  Oct 21, 2010  Re:  [name], [name]         DOB:  May 14, 1951  PRIMARY CARE PHYSICIAN:  Corrie Mckusick, MD  REASON FOR CONSULTATION:  Right lung nodule in the setting of history of stage III adenocarcinoma of the rectum.  HISTORY OF PRESENT ILLNESS:  The patient is a 60 year old male who was diagnosed with stage III adenocarcinoma of the rectum presenting with a 2.7-cm moderately differentiated mass with 3 of 8 positive nodes.  In 2008 following surgical recovery, he was treated with postoperative radiation and planned 6 cycles of Oxaliplatin, capecitabine.  Since then, he has been followed closely by Dr. [name].  Recently, the patient had noted increasing fatigue.  CEA levels had increased from 1.3- 5.2 precipitating the further evaluation in restaging of the patient.  A CT scan was performed that showed a 10 x 8 x 10 mm nonspecific right upper lobe lung nodule noncalcified that was had a SUV of 3.5 on a PET scan.  In addition, the patient had significant uptake at the sigmoid anastomotic site.  Colonoscopy was performed.  A biopsy showed inflammation of the colon.  The patient is referred by Dr. [name] for evaluation of the right lung nodule.  In addition, in the right lung much smaller lesions that were diagnostically indeterminate.  The patient has a past medical history significant for hypertension, type 2 diabetes, renal insufficiency with a baseline creatinine of 2.26 noted on May 18.  He has had no history of stroke.  He does have hyperlipidemia.  Has a known history of coronary disease with a coronary stents placed in 2003, but without any subsequent cardiac events.  He does have history of multiple kidney stones.  The patient is separated from his wife, lives alone.  His son accompanies him to the office today.  He is disabled, drinks occasional beer.  His current medications include: 1. Aspirin 81 mg a  day. 2. Lisinopril 10 mg a day. 3. Metoprolol 50 mg a day. 4. Simvastatin 20 mg a day. 5. B12 monthly.  ALLERGIES:  Daypro which caused swelling and nausea when he took this for a kidney stone.  REVIEW OF SYSTEMS:  CARDIAC REVIEW OF SYSTEMS:  Negative for chest pain, negative for resting shortness of breath, exertional shortness of breath.  He does have increasing fatigue.  Denies syncope, presyncope or lower extremity edema.  He has had no calf tenderness.  GENERAL REVIEW OF SYSTEMS:  He has had weight loss.  Since prior to his initial presentation in 2008 from 260 down to 210.  Denies hemoptysis.  Denies amaurosis, denies TIAs. VITAL SIGNS:  He is asymptomatic.  His weight is 210, 5 feet 10 inches tall. LUNGS:  I do not appreciate any cervical or supraclavicular adenopathy. Lungs are clear. ABDOMEN:  He has a well-healed lower abdominal incision.  No calf tenderness.  The patient's CT scans and PET scan reviewed.  IMPRESSION:  The patient is a previous smoker, but quit more than 25 years ago.  The right upper lobe lung nodule is very likely to be malignant, although it is unclear if it is a new primary lung early stage lung cancer versus metastatic disease from the colon.  He has no other evidence of nodal disease on the PET scan.  I have recommended to him.  We proceed with a needle biopsy to obtain a tissue diagnosis and then based on this make further recommendations whether to proceed with primary  resection with intent to cure if it is a primary lung cancer versus palliation with wedge resection of metastatic lesion and then we will coordinate this with Dr. [name].  We will arrange for the needle biopsy in the next couple days and I will plan to see him back next week with the results of the needle biopsy and then will further review the case with Dr. [name].  [name], MD Electronically Signed  EG/MEDQ  D:  10/21/2010  T:  10/22/2010  Job:  [id number]  cc:    [name]. [name], MD Corrie Mckusick, M.D.

## 2010-10-27 ENCOUNTER — Ambulatory Visit (HOSPITAL_COMMUNITY)
Admission: RE | Admit: 2010-10-27 | Discharge: 2010-10-27 | Disposition: A | Discharge status: Home / Self Care | Payer: Managed Care, Other (non HMO) | Source: Ambulatory Visit | Attending: Cardiothoracic Surgery | Admitting: Cardiothoracic Surgery

## 2010-10-27 ENCOUNTER — Other Ambulatory Visit (HOSPITAL_COMMUNITY): Payer: Managed Care, Other (non HMO)

## 2010-10-27 ENCOUNTER — Other Ambulatory Visit: Payer: Self-pay | Admitting: Diagnostic Radiology

## 2010-10-27 ENCOUNTER — Other Ambulatory Visit: Payer: Self-pay | Admitting: Cardiothoracic Surgery

## 2010-10-27 DIAGNOSIS — R911 Solitary pulmonary nodule: Secondary | ICD-10-CM

## 2010-10-27 DIAGNOSIS — C7800 Secondary malignant neoplasm of unspecified lung: Secondary | ICD-10-CM | POA: Insufficient documentation

## 2010-10-27 HISTORY — PX: LUNG BIOPSY: SHX232

## 2010-10-27 LAB — CBC
HCT: 34.4 % — ABNORMAL LOW (ref 39.0–52.0)
Hemoglobin: 12.7 g/dL — ABNORMAL LOW (ref 13.0–17.0)
MCH: 29.9 pg (ref 26.0–34.0)
MCHC: 36.9 g/dL — ABNORMAL HIGH (ref 30.0–36.0)
MCV: 80.9 fL (ref 78.0–100.0)
Platelets: 112 10*3/uL — ABNORMAL LOW (ref 150–400)
RBC: 4.25 MIL/uL (ref 4.22–5.81)
RDW: 14.3 % (ref 11.5–15.5)
WBC: 7.8 10*3/uL (ref 4.0–10.5)

## 2010-10-27 LAB — PROTIME-INR
INR: 1.08 (ref 0.00–1.49)
Prothrombin Time: 14.2 seconds (ref 11.6–15.2)

## 2010-10-27 LAB — APTT: aPTT: 187 seconds — ABNORMAL HIGH (ref 24–37)

## 2010-10-28 ENCOUNTER — Ambulatory Visit: Payer: Managed Care, Other (non HMO) | Admitting: Cardiothoracic Surgery

## 2010-10-28 ENCOUNTER — Ambulatory Visit (INDEPENDENT_AMBULATORY_CARE_PROVIDER_SITE_OTHER): Payer: Managed Care, Other (non HMO) | Admitting: Cardiothoracic Surgery

## 2010-10-28 DIAGNOSIS — C3490 Malignant neoplasm of unspecified part of unspecified bronchus or lung: Secondary | ICD-10-CM

## 2010-10-29 NOTE — Assessment & Plan Note (Signed)
OFFICE VISIT  KRISHNA, DANCEL DOB:  March 19, 1951                                        Oct 28, 2010 CHART #:  [id number]  The patient who was originally seen in the office last week returns after a needle biopsy of the right upper lobe mass to discuss the options and path reports.  I discussed the pathological report by Dr. Colonel Bald, shows an adenocarcinoma of the right upper lobe compatible with metastatic adenocarcinoma of colon primary.  PHYSICAL EXAMINATION:  VITAL SIGNS:  The patient's blood pressure is 102/80, pulse 80, respiratory rate 20, O2 saturation 100% on room air. GENERAL:  The patient is in no distress, tolerated the needle biopsy without difficulties.  LUNGS:  Clear bilaterally.  ABDOMEN:  Benign. EXTREMITIES:  He has no calf tenderness or pedal edema.  I have reviewed with the patient the pathologic results, attempted to contact Dr. [name] today.  His office is closed, but I will contact him tomorrow to review the case with consideration with metastatic disease, multimodality treatment for control of metastatic disease. Consideration would include surgical resection of the nodule, though on CT, there are several other small potential nodules that are too small to characterize.  The other possibility is combination of chemo leaving the nodule in place to monitor its size for marker or the possibility of IMRT radiation to control the pulmonary metastasis with the high likelihood that in the future others will appear and the patient will not have loss of pulmonary tissue in the process.  I have discussed the options with the patient and will follow up with Dr. [name] before developing a final plan.  The concern about surgery is the patient does have significant degree of renal insufficiency, last creatinine noted in the chart is approximately 2.5.  [name], MD Electronically Signed  EG/MEDQ  D:  10/28/2010  T:  10/29/2010  Job:   [id number]  cc:   [name]. [name], MD Corrie Mckusick, M.D.

## 2010-11-04 ENCOUNTER — Other Ambulatory Visit (HOSPITAL_COMMUNITY): Payer: Managed Care, Other (non HMO)

## 2010-11-04 ENCOUNTER — Encounter (HOSPITAL_COMMUNITY): Payer: Managed Care, Other (non HMO)

## 2010-11-05 ENCOUNTER — Encounter (HOSPITAL_COMMUNITY): Payer: Managed Care, Other (non HMO) | Attending: Oncology

## 2010-11-05 DIAGNOSIS — C7800 Secondary malignant neoplasm of unspecified lung: Secondary | ICD-10-CM | POA: Insufficient documentation

## 2010-11-05 DIAGNOSIS — E119 Type 2 diabetes mellitus without complications: Secondary | ICD-10-CM | POA: Insufficient documentation

## 2010-11-05 DIAGNOSIS — Z85048 Personal history of other malignant neoplasm of rectum, rectosigmoid junction, and anus: Secondary | ICD-10-CM | POA: Insufficient documentation

## 2010-11-05 DIAGNOSIS — C189 Malignant neoplasm of colon, unspecified: Secondary | ICD-10-CM

## 2010-11-05 DIAGNOSIS — E538 Deficiency of other specified B group vitamins: Secondary | ICD-10-CM

## 2010-11-10 ENCOUNTER — Other Ambulatory Visit (HOSPITAL_COMMUNITY): Payer: Self-pay | Admitting: Oncology

## 2010-11-10 ENCOUNTER — Ambulatory Visit (HOSPITAL_COMMUNITY): Payer: Managed Care, Other (non HMO)

## 2010-11-10 ENCOUNTER — Inpatient Hospital Stay (HOSPITAL_COMMUNITY)
Admission: RE | Admit: 2010-11-10 | Discharge: 2010-11-15 | DRG: 418 | Disposition: A | Discharge status: Home / Self Care | Payer: Managed Care, Other (non HMO) | Source: Ambulatory Visit | Attending: Internal Medicine | Admitting: Internal Medicine

## 2010-11-10 ENCOUNTER — Ambulatory Visit (HOSPITAL_COMMUNITY)
Admission: RE | Admit: 2010-11-10 | Discharge: 2010-11-10 | Disposition: A | Discharge status: Home / Self Care | Payer: Managed Care, Other (non HMO) | Source: Ambulatory Visit | Attending: Oncology | Admitting: Oncology

## 2010-11-10 ENCOUNTER — Encounter (HOSPITAL_COMMUNITY): Payer: Managed Care, Other (non HMO)

## 2010-11-10 ENCOUNTER — Encounter (HOSPITAL_COMMUNITY): Payer: Managed Care, Other (non HMO) | Admitting: Oncology

## 2010-11-10 DIAGNOSIS — K8020 Calculus of gallbladder without cholecystitis without obstruction: Secondary | ICD-10-CM | POA: Diagnosis present

## 2010-11-10 DIAGNOSIS — R5381 Other malaise: Secondary | ICD-10-CM

## 2010-11-10 DIAGNOSIS — K821 Hydrops of gallbladder: Principal | ICD-10-CM | POA: Diagnosis present

## 2010-11-10 DIAGNOSIS — R110 Nausea: Secondary | ICD-10-CM | POA: Insufficient documentation

## 2010-11-10 DIAGNOSIS — D134 Benign neoplasm of liver: Secondary | ICD-10-CM

## 2010-11-10 DIAGNOSIS — R5383 Other fatigue: Secondary | ICD-10-CM

## 2010-11-10 DIAGNOSIS — C20 Malignant neoplasm of rectum: Secondary | ICD-10-CM

## 2010-11-10 DIAGNOSIS — I10 Essential (primary) hypertension: Secondary | ICD-10-CM | POA: Diagnosis present

## 2010-11-10 DIAGNOSIS — N179 Acute kidney failure, unspecified: Secondary | ICD-10-CM | POA: Diagnosis present

## 2010-11-10 DIAGNOSIS — C189 Malignant neoplasm of colon, unspecified: Secondary | ICD-10-CM | POA: Insufficient documentation

## 2010-11-10 DIAGNOSIS — K7689 Other specified diseases of liver: Secondary | ICD-10-CM | POA: Insufficient documentation

## 2010-11-10 DIAGNOSIS — E119 Type 2 diabetes mellitus without complications: Secondary | ICD-10-CM | POA: Diagnosis present

## 2010-11-10 DIAGNOSIS — E860 Dehydration: Secondary | ICD-10-CM | POA: Diagnosis present

## 2010-11-10 DIAGNOSIS — C7800 Secondary malignant neoplasm of unspecified lung: Secondary | ICD-10-CM | POA: Diagnosis present

## 2010-11-10 DIAGNOSIS — R634 Abnormal weight loss: Secondary | ICD-10-CM | POA: Insufficient documentation

## 2010-11-10 DIAGNOSIS — R197 Diarrhea, unspecified: Secondary | ICD-10-CM | POA: Insufficient documentation

## 2010-11-10 LAB — CBC
Hemoglobin: 13.9 g/dL (ref 13.0–17.0)
MCH: 30.2 pg (ref 26.0–34.0)
MCHC: 37.4 g/dL — ABNORMAL HIGH (ref 30.0–36.0)

## 2010-11-10 LAB — COMPREHENSIVE METABOLIC PANEL
ALT: 115 U/L — ABNORMAL HIGH (ref 0–53)
Calcium: 10.5 mg/dL (ref 8.4–10.5)
Creatinine, Ser: 2.67 mg/dL — ABNORMAL HIGH (ref 0.4–1.5)
GFR calc Af Amer: 30 mL/min — ABNORMAL LOW (ref >60)
Glucose, Bld: 142 mg/dL — ABNORMAL HIGH (ref 70–99)
Sodium: 116 mEq/L — CL (ref 135–145)
Total Protein: 7.3 g/dL (ref 6.0–8.3)

## 2010-11-10 LAB — DIFFERENTIAL
Basophils Relative: 0 % (ref 0–1)
Eosinophils Absolute: 0 10*3/uL (ref 0.0–0.7)
Monocytes Absolute: 0.3 10*3/uL (ref 0.1–1.0)
Monocytes Relative: 3 % (ref 3–12)
Neutro Abs: 9.8 10*3/uL — ABNORMAL HIGH (ref 1.7–7.7)

## 2010-11-11 LAB — CBC
Hemoglobin: 12.1 g/dL — ABNORMAL LOW (ref 13.0–17.0)
MCH: 30 pg (ref 26.0–34.0)
MCV: 82.1 fL (ref 78.0–100.0)
RBC: 4.03 MIL/uL — ABNORMAL LOW (ref 4.22–5.81)
WBC: 6.7 10*3/uL (ref 4.0–10.5)

## 2010-11-11 LAB — COMPREHENSIVE METABOLIC PANEL
ALT: 86 U/L — ABNORMAL HIGH (ref 0–53)
CO2: 22 mEq/L (ref 19–32)
Calcium: 9.6 mg/dL (ref 8.4–10.5)
Chloride: 92 mEq/L — ABNORMAL LOW (ref 96–112)
GFR calc Af Amer: 50 mL/min — ABNORMAL LOW (ref >60)
GFR calc non Af Amer: 41 mL/min — ABNORMAL LOW (ref >60)
Glucose, Bld: 85 mg/dL (ref 70–99)
Sodium: 125 mEq/L — ABNORMAL LOW (ref 135–145)
Total Bilirubin: 1 mg/dL (ref 0.3–1.2)

## 2010-11-11 LAB — CEA: CEA: 5.4 ng/mL — ABNORMAL HIGH (ref 0.0–5.0)

## 2010-11-11 LAB — DIFFERENTIAL
Lymphs Abs: 0.6 10*3/uL — ABNORMAL LOW (ref 0.7–4.0)
Monocytes Relative: 7 % (ref 3–12)
Neutro Abs: 5.7 10*3/uL (ref 1.7–7.7)
Neutrophils Relative %: 84 % — ABNORMAL HIGH (ref 43–77)

## 2010-11-11 NOTE — Group Therapy Note (Signed)
  NAMESINCLAIR, ALLIGOOD               ACCOUNT NO.:  [id number]  MEDICAL RECORD NO.:  [id number]  LOCATION:  A307                          FACILITY:  [facility]  PHYSICIAN:  Wilson Singer, M.D.DATE OF BIRTH:  Mar 03, 1951  DATE OF PROCEDURE: DATE OF DISCHARGE:                                PROGRESS NOTE   This man is clearly improving and he has stopped vomiting.  He is able to take food and fluids to an extent.  He has no abdominal pain.  PHYSICAL EXAMINATION:  VITAL SIGNS:  Temperature 97.8, blood pressure 97/63, pulse 63, saturation 98% on room air. GENERAL:  He looks systemically well.  He is not clinically shocked despite his soft blood pressure. CARDIAC:  Heart sounds are present and normal. LUNGS:  Lung fields are clear. ABDOMEN:  Soft and nontender.  INVESTIGATIONS:  Sodium 125, potassium 3.5, bicarbonate 22, BUN improving to 94, creatinine improving to 1.73, albumin 3.3.  Hemoglobin 12.1, white blood cell count 6.7, platelets 93.  His liver enzymes, AST and ALT are also improving.  IMPRESSION: 1. Dehydration/acute renal failure. 2. Stage IV colon cancer. 3. Hypertension, currently hypovolemic. 4. Diabetes, controlled. 5. Gallstones.  PLAN: 1. Reduce intravenous fluids. 2. Encourage p.o. fluids. 3. Await surgical consultation.     Wilson Singer, M.D.    NCG/MEDQ  D:  11/11/2010  T:  11/11/2010  Job:  [id number]  Electronically Signed by Lilly Cove M.D. on 11/11/2010 12:12:40 PM

## 2010-11-11 NOTE — H&P (Signed)
[name], [name]               ACCOUNT NO.:  [id number]  MEDICAL RECORD NO.:  [id number]  LOCATION:  A307                          FACILITY:  [facility]  PHYSICIAN:  Wilson Singer, M.D.DATE OF BIRTH:  04-21-1951  DATE OF ADMISSION:  11/10/2010 DATE OF DISCHARGE:  LH                             HISTORY & PHYSICAL   CHIEF COMPLAINT: 1. Nausea and vomiting. 2. Dehydration.  HISTORY OF PRESENT ILLNESS:  This very pleasant 60 year old man presents once again with 1-week history of nausea and vomiting associated with some right upper quadrant abdominal discomfort.  He was seen in Dr. Thornton Papas office today and found to be hypotensive and Dr. Mariel [name] did some lab work which showed him to have a BUN of 128, creatinine of 2.67 and a sodium of 116.  He is therefore now being admitted for rehydration as he clearly was very dehydrated over the last week or so. The patient recently underwent CT-guided biopsy of the right upper lobe lung nodule on Oct 27, 2010 and the biopsy showed this to be metastatic disease, adenocarcinoma consistent with colon cancer metastases.  He was due to have surgery for this, but in the meantime he has become sick.  PAST MEDICAL HISTORY: 1. Significant for colon cancer now stage IV. 2. Hypertension. 3. Diet-controlled diabetes. 4. Sleep apnea. 5. History of kidney stones. 6. B12 deficiency. 7. History of fatty liver.  MEDICATIONS: 1. Metoprolol 50 mg daily. 2. Simvastatin 20 mg at bedtime. 3. He has not had any recent chemotherapy.  ALLERGIES:  The patient is allergic to [facility].  SOCIAL HISTORY:  The patient is married, lives with his wife.  Remote history of tobacco abuse.  Occasional alcohol.  He is a retired Estate manager/land agent.  FAMILY HISTORY:  Father had colon cancer.  REVIEW OF SYSTEMS:  Apart from the symptoms as mentioned above, there are no other symptoms referable to all systems reviewed.  PHYSICAL EXAMINATION:  VITAL SIGNS:   Temperature 97.6, blood pressure 107/70, pulse 63, saturation 100% on room air. GENERAL:  He looks clinically dehydrated, but not clinically in shock at this present time.  He is somewhat pale, but not jaundiced.  There is no clubbing, cyanosis. CARDIOVASCULAR:  Heart sounds are present and normal without murmurs. Jugular venous pressure is not raised. LUNGS:  Fields are clinically clear. ABDOMEN:  Soft and nontender although there is slight discomfort in the right upper quadrant.  There are no masses felt.  NEUROLOGIC:  He is alert and oriented without any focal neurologic signs. SKIN:  There are no obvious skin lesions or rashes.  INVESTIGATIONS:  Hemoglobin 13.9, white blood cell count 11.0, platelets 122.  Sodium 116, potassium 4.0, bicarbonate 24, chloride 78, glucose 142, BUN 128, creatinine 2.67, AST 51, ALT 115, alkaline phosphatase within the normal range at 82, and total bilirubin 1.4.  PROBLEM LIST: 1. Dehydration/acute renal failure. 2. 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 3. History of hypertension, currently hypotensive. 4. Diabetes, diet controlled. 5. Cholelithiasis.  PLAN: 1. Admit. 2. Intravenous fluid resuscitation. 3. Surgical consultation.  Further recommendations will depend on the patient's hospital progress.     Wilson Singer, M.D.  NCG/MEDQ  D:  11/10/2010  T:  11/10/2010  Job:  [id number]  cc:   [name], M.D. Fax: [phone]  [name] Horns. [name], MD Fax: [phone]  Electronically Signed by [name] M.D. on 11/11/2010 12:12:29 PM

## 2010-11-12 ENCOUNTER — Other Ambulatory Visit: Payer: Self-pay | Admitting: General Surgery

## 2010-11-12 ENCOUNTER — Other Ambulatory Visit (HOSPITAL_COMMUNITY): Payer: Managed Care, Other (non HMO)

## 2010-11-12 LAB — COMPREHENSIVE METABOLIC PANEL
ALT: 66 U/L — ABNORMAL HIGH (ref 0–53)
BUN: 56 mg/dL — ABNORMAL HIGH (ref 6–23)
CO2: 22 mEq/L (ref 19–32)
Calcium: 9.3 mg/dL (ref 8.4–10.5)
Creatinine, Ser: 1.36 mg/dL — ABNORMAL HIGH (ref 0.50–1.35)
GFR calc Af Amer: >60 mL/min (ref >60)
GFR calc non Af Amer: 54 mL/min — ABNORMAL LOW (ref >60)
Glucose, Bld: 77 mg/dL (ref 70–99)
Sodium: 127 mEq/L — ABNORMAL LOW (ref 135–145)
Total Protein: 6.1 g/dL (ref 6.0–8.3)

## 2010-11-12 LAB — CBC
MCHC: 35.9 g/dL (ref 30.0–36.0)
MCV: 83.6 fL (ref 78.0–100.0)
Platelets: 61 10*3/uL — ABNORMAL LOW (ref 150–400)
RDW: 15 % (ref 11.5–15.5)
WBC: 4.7 10*3/uL (ref 4.0–10.5)

## 2010-11-12 LAB — DIFFERENTIAL
Basophils Absolute: 0 10*3/uL (ref 0.0–0.1)
Eosinophils Absolute: 0 10*3/uL (ref 0.0–0.7)
Eosinophils Relative: 0 % (ref 0–5)
Monocytes Absolute: 0.4 10*3/uL (ref 0.1–1.0)

## 2010-11-13 LAB — COMPREHENSIVE METABOLIC PANEL
ALT: 73 U/L — ABNORMAL HIGH (ref 0–53)
AST: 54 U/L — ABNORMAL HIGH (ref 0–37)
Albumin: 2.6 g/dL — ABNORMAL LOW (ref 3.5–5.2)
Alkaline Phosphatase: 56 U/L (ref 39–117)
BUN: 31 mg/dL — ABNORMAL HIGH (ref 6–23)
Chloride: 106 mEq/L (ref 96–112)
Potassium: 4.3 mEq/L (ref 3.5–5.1)
Sodium: 135 mEq/L (ref 135–145)
Total Bilirubin: 0.5 mg/dL (ref 0.3–1.2)
Total Protein: 5.3 g/dL — ABNORMAL LOW (ref 6.0–8.3)

## 2010-11-13 LAB — CBC
HCT: 28.7 % — ABNORMAL LOW (ref 39.0–52.0)
MCHC: 35.5 g/dL (ref 30.0–36.0)
RDW: 15.5 % (ref 11.5–15.5)
WBC: 5.6 10*3/uL (ref 4.0–10.5)

## 2010-11-13 LAB — DIFFERENTIAL
Basophils Absolute: 0 10*3/uL (ref 0.0–0.1)
Basophils Relative: 0 % (ref 0–1)
Eosinophils Relative: 0 % (ref 0–5)
Lymphocytes Relative: 5 % — ABNORMAL LOW (ref 12–46)
Monocytes Absolute: 0.3 10*3/uL (ref 0.1–1.0)
Neutro Abs: 5.1 10*3/uL (ref 1.7–7.7)

## 2010-11-14 LAB — DIFFERENTIAL
Basophils Relative: 0 % (ref 0–1)
Eosinophils Absolute: 0 10*3/uL (ref 0.0–0.7)
Eosinophils Relative: 0 % (ref 0–5)
Lymphs Abs: 0.4 10*3/uL — ABNORMAL LOW (ref 0.7–4.0)
Monocytes Absolute: 0.3 10*3/uL (ref 0.1–1.0)
Monocytes Relative: 8 % (ref 3–12)

## 2010-11-14 LAB — CBC
MCH: 30.5 pg (ref 26.0–34.0)
MCHC: 35.4 g/dL (ref 30.0–36.0)
MCV: 86.3 fL (ref 78.0–100.0)
Platelets: 61 10*3/uL — ABNORMAL LOW (ref 150–400)
RBC: 3.44 MIL/uL — ABNORMAL LOW (ref 4.22–5.81)
RDW: 15.8 % — ABNORMAL HIGH (ref 11.5–15.5)

## 2010-11-14 LAB — COMPREHENSIVE METABOLIC PANEL
ALT: 84 U/L — ABNORMAL HIGH (ref 0–53)
AST: 52 U/L — ABNORMAL HIGH (ref 0–37)
CO2: 23 mEq/L (ref 19–32)
Calcium: 9.1 mg/dL (ref 8.4–10.5)
Creatinine, Ser: 1.18 mg/dL (ref 0.50–1.35)
GFR calc non Af Amer: >60 mL/min (ref >60)
Sodium: 135 mEq/L (ref 135–145)
Total Protein: 5.8 g/dL — ABNORMAL LOW (ref 6.0–8.3)

## 2010-11-14 NOTE — Group Therapy Note (Signed)
  [name], [name]               ACCOUNT NO.:  [id number]  MEDICAL RECORD NO.:  [id number]  LOCATION:  A307                          FACILITY:  [facility]  PHYSICIAN:  Wilson Singer, M.D.DATE OF BIRTH:  April 30, 1951  DATE OF PROCEDURE: DATE OF DISCHARGE:                                PROGRESS NOTE   This man had a laparoscopic cholecystectomy yesterday by Dr. Leticia Penna. He has done very well overnight and he has no specific complaints.  PHYSICAL EXAMINATION:  Temperature 97.6, blood pressure 97/61, pulse 74, saturation 98% on room air.  He looks systemically well and he is not shocked despite his soft blood pressure.  Heart sounds are present and normal.  Lung fields are clear.  Abdomen is soft and there is a drain in situ.  There is no specific untenderness.  INVESTIGATIONS:  Sodium 135, potassium 4.3, bicarbonate 21, BUN 31, creatinine 1.25, this is improved from yesterday.  Hemoglobin 10.2, white blood cell count 5.6, platelets 56,000.  IMPRESSION: 1. Dehydration/acute renal failure, resolving. 2. Stage IV colon cancer. 3. Hypertension, currently borderline hypotensive. 4. Diabetes, reasonable control. 5. Cholelithiasis status post laparoscopic cholecystectomy.  PLAN: 1. Decrease intravenous fluids. 2. Mobilize the patient.     Wilson Singer, M.D.     NCG/MEDQ  D:  11/13/2010  T:  11/13/2010  Job:  [id number]  Electronically Signed by Lilly Cove M.D. on 11/14/2010 07:17:59 AM

## 2010-11-14 NOTE — Group Therapy Note (Signed)
  [name], [name]               ACCOUNT NO.:  [id number]  MEDICAL RECORD NO.:  [id number]  LOCATION:                                 FACILITY:  PHYSICIAN:  Wilson Singer, M.D.DATE OF BIRTH:  1950-08-15  DATE OF PROCEDURE: DATE OF DISCHARGE:                                PROGRESS NOTE   This man continues to feel better and his symptoms have resolved.  He was seen by Dr. Leticia Penna, Surgery, who is going to operate on him today and perform a cholecystectomy.  PHYSICAL EXAMINATION:  VITAL SIGNS:  Temperature 97.9, blood pressure 110/73, pulse 70, saturation 98% on room air. GENERAL:  He looks systemically well and there are really no new physical findings.  INVESTIGATIONS:  Hemoglobin 11.3, white blood cell count 4.7, platelets 61.  Sodium 127; potassium 3.1; bicarbonate 22; BUN 56; creatinine 1.36, almost in the normal range.  IMPRESSION: 1. Dehydration/acute renal failure. 2. Stage IV colon cancer. 3. Hypertension, currently normotensive. 4. Diabetes, controlled. 5. Cholelithiasis.  PLAN: 1. Decrease intravenous fluids. 2. Cholecystectomy today. 3. Replete potassium.     Wilson Singer, M.D.     NCG/MEDQ  D:  11/12/2010  T:  11/12/2010  Job:  [id number]  Electronically Signed by Lilly Cove M.D. on 11/14/2010 07:17:54 AM

## 2010-11-15 LAB — BASIC METABOLIC PANEL
BUN: 18 mg/dL (ref 6–23)
Chloride: 104 mEq/L (ref 96–112)
Creatinine, Ser: 1.13 mg/dL (ref 0.50–1.35)
Glucose, Bld: 88 mg/dL (ref 70–99)
Potassium: 4.4 mEq/L (ref 3.5–5.1)

## 2010-11-15 LAB — CBC
HCT: 29.3 % — ABNORMAL LOW (ref 39.0–52.0)
Hemoglobin: 10.4 g/dL — ABNORMAL LOW (ref 13.0–17.0)
MCV: 84.4 fL (ref 78.0–100.0)
WBC: 5.7 10*3/uL (ref 4.0–10.5)

## 2010-11-15 LAB — DIFFERENTIAL
Basophils Absolute: 0 10*3/uL (ref 0.0–0.1)
Lymphocytes Relative: 6 % — ABNORMAL LOW (ref 12–46)
Lymphs Abs: 0.4 10*3/uL — ABNORMAL LOW (ref 0.7–4.0)
Monocytes Absolute: 0.4 10*3/uL (ref 0.1–1.0)
Neutro Abs: 4.9 10*3/uL (ref 1.7–7.7)

## 2010-11-16 ENCOUNTER — Inpatient Hospital Stay (HOSPITAL_COMMUNITY)
Admission: RE | Admit: 2010-11-16 | Payer: Managed Care, Other (non HMO) | Source: Ambulatory Visit | Admitting: Cardiothoracic Surgery

## 2010-11-16 NOTE — Discharge Summary (Signed)
  NAMEJEARL, [name]               ACCOUNT NO.:  [id number]  MEDICAL RECORD NO.:  [id number]  LOCATION:                                 FACILITY:  PHYSICIAN:  Wilson Singer, M.D.DATE OF BIRTH:  [birth year]  DATE OF ADMISSION:  11/10/2010 DATE OF DISCHARGE:  06/18/2012LH                              DISCHARGE SUMMARY   FINAL DISCHARGE DIAGNOSES: 1. Acute renal failure/dehydration, resolved. 2. Stage IV colon cancer. 3. Cholelithiasis, status post laparoscopic cholecystectomy on November 12, 2010. 4. Diabetes controlled. 5. Hypertension controlled.  CONDITION ON DISCHARGE:  Stable.  MEDICATIONS ON DISCHARGE: 1. Hydrocodone/APAP 5/325 one to two tablets every 4 h. p.r.n. 2. Metoclopramide 5 mg q.i.d. p.r.n. for nausea. 3. Vitamin B12 injection monthly.  HISTORY:  This very pleasant 60 year old man was admitted with nausea and vomiting and was profoundly dehydrated.  Please see initial history and physical examination done by Dr. Lilly Cove.  HOSPITAL PROGRESS:  The patient was admitted and started on aggressive intravenous fluid hydration.  He did well with this.  He also had an ultrasound of his abdomen done while he was in the hospital and this showed him to have gallstones.  He was seen by surgery, Dr. Leticia Penna who operated on him on November 12, 2010, and did a laparoscopic cholecystectomy.  There were no postoperative complications.  His nausea and vomiting has improved significantly and he was able to tolerate a diet.  Today, he is doing well without any symptoms whatsoever.  He has a drain in situ from his cholecystectomy site which he must keep till he sees Dr. Leticia Penna in one week's time.  PHYSICAL EXAMINATION:  VITAL SIGNS:  Temperature 98.3, blood pressure 130/84, pulse 88, saturation 97% on room air. CARDIAC:  Heart sounds are present and normal. CHEST:  Lung fields are entirely clear. ABDOMEN:  Soft and not particularly tender.  INVESTIGATIONS:  Sodium  136, potassium 4.4, bicarbonate 24, BUN 18, creatinine 1.13, hemoglobin 10.4, white blood cell count 5.7, platelets improving to 72.  His hemoglobin A1c was 5.7% when it was checked on this admission.  DISPOSITION:  He is stable to be discharged now and he must follow up with Dr. Leticia Penna surgery in one week's time for followup of his cholecystectomy.  I have also told him to make an appointment for followup with his oncologist, Dr. [name] in the next couple weeks or so.     Wilson Singer, M.D.     NCG/MEDQ  D:  11/15/2010  T:  11/15/2010  Job:  [id number]  cc:   [name]. [name], MD Fax: [phone]  Corrie Mckusick, M.D. Fax: [phone]  Tilford Pillar, MD Fax: [phone]  Electronically Signed by Lilly Cove M.D. on 11/16/2010 09:55:38 AM

## 2010-11-16 NOTE — Group Therapy Note (Signed)
  NAMEKEM, HENSEN               ACCOUNT NO.:  [id number]  MEDICAL RECORD NO.:  [id number]  LOCATION:                                 FACILITY:  PHYSICIAN:  Wilson Singer, M.D.DATE OF BIRTH:  03/12/1951  DATE OF PROCEDURE:  11/14/2010 DATE OF DISCHARGE:                                PROGRESS NOTE   This man continues to improve.  He did have one episode of nausea but he thinks that this was due to overeating which I suspect is the case.  He has been mobilizing well, however.  PHYSICAL EXAMINATION:  VITAL SIGNS:  Temperature 98.4, blood pressure 106/74, pulse 99, saturation 97% on room air. GENERAL:  He looks systemically well. CHEST:  Lung fields are clear. CARDIAC:  Heart sounds are present normal.  He is not in heart failure. ABDOMEN:  Soft and largely nontender.  INVESTIGATIONS:  Sodium 135, potassium 4.6, bicarbonate 23, BUN normal at 23, creatinine 1.18.  AST slightly raised at 52 and ALT also raised at 84, albumin is 2.7.  Hemoglobin 10.5, white blood cell count 4.6, platelets 61 which is slight improvement from yesterday.  IMPRESSION: 1. Dehydration/acute renal failure, resolved. 2. Stage IV colon cancer. 3. Hypertension, controlled. 4. Diabetes, controlled. 5. Cholelithiasis status post laparoscopic cholecystectomy, doing well     postoperatively.  PLAN: 1. Decrease IV fluids to KVO. 2. Continue mobilization. 3. Possibly discharge home tomorrow.     Wilson Singer, M.D.    NCG/MEDQ  D:  11/14/2010  T:  11/14/2010  Job:  [id number]  Electronically Signed by Lilly Cove M.D. on 11/16/2010 09:55:42 AM

## 2010-11-17 NOTE — Op Note (Signed)
[name], MASSMAN               ACCOUNT NO.:  [id number]  MEDICAL RECORD NO.:  [id number]  LOCATION:  A307                          FACILITY:  [facility]  PHYSICIAN:  [name] Pillar, MD      DATE OF BIRTH:  [birth year]  DATE OF PROCEDURE:  11/12/2010 DATE OF DISCHARGE:                              OPERATIVE REPORT   PREOPERATIVE DIAGNOSIS:  Acute cholecystitis.  POSTOPERATIVE DIAGNOSIS:  Hydrops of gallbladder.  PROCEDURE:  Laparoscopic cholecystectomy.  SURGEON:  [name] Pillar, MD  ANESTHESIA:  General endotracheal, local anesthetic 0.5% Sensorcaine plain.  SPECIMEN:  Gallbladder.  ESTIMATED BLOOD LOSS:  200 mL.  INDICATIONS:  The patient is a 60 year old male, known to me previously for colon resection for colon adenocarcinoma in the past.  He has continued to follow with Dr. Mariel [name] and actually had some increasing abdominal pain and nausea, presented to Dr. Mariel [name] on Wednesday.  At that time, he was admitted for dehydration.  Workup was suspicious for acute cholecystitis.  Risks and alternatives of laparoscopic, possible open cholecystectomy, were discussed at length with the patient including, but not limited to risk of bleeding, infection, bile leak, small bowel injury, common bile duct injury as well as possibility of intraoperative cardiopulmonary events.  The patient's questions and concerns were addressed.  The patient was consented for the planned procedure.  OPERATION:  The patient was taken to the operating room and was placed in supine position on the operating room table.  At which time, the general anesthesia was administered.  Once the patient was asleep, he was endotracheally intubated by Anesthesia.  At this point, his abdomen was prepped with DuraPrep solution and draped in standard fashion.  An incision was created supraumbilically with the 15-blade scalpel. Additional dissection down to the subcutaneous tissues carried out using electrocautery  down to the fascia, at which point, I grasped the fascia with a Kocher clamp and sharply divided the fascia with both Metzenbaum scissors.  At this point, the peritoneum was grasped with a Kelly clamp x2.  Entering it, there were no incarcerated bowel was present.  I divided the peritoneum with Metzenbaum scissors.  Upon entering into the peritoneal cavity, I then elongated the peritoneal defect enough to insert a Hasson trocar.  Digital palpation of the anterior abdominal wall did not elicit any adherent bowel and I placed the Hasson trocar in standard fashion.  At this point, pneumoperitoneum was initiated.  The laparoscope was inserted.  There was no evidence of any trocar insertion injury.  At this time, the remaining trocars were placed with 11-mm trocar in the epigastrium and 5-mm trocar in the midline between the two 11-mm trocars and the 5-mm trocars placed in the right lateral abdominal wall.  At this point, multiple adhesions were noted within the right upper quadrant.  Several loops of dilated small nodes were encountered, so I did not feel like I will be able to successfully proceed laparoscopically.  I did divide the adhesions using electrocautery. These were wispy  in nature, but I was easily able to identify the right lobe of the liver.  To note, I did inspection of the liver, noted a small serosal effusion on the left  lobe of the liver at the posterior aspect.  This was captured with a picture for demonstration to Dr. Mariel [name].  At this point, I was able to identify the fundus of the gallbladder.  Due to its __________ nature, I did use a Weck needle to aspirate the contents.  These were clear in nature consistent with hydrops of the gallbladder.  This did decompress the gallbladder adequately to grasp it and lifted up and over the right lobe of the liver.  I continued the dissection.  This was teased due to the extensive amount of adhesion, but I was able to identify  the infundibulum.  With careful continued dissection on the lateral aspect of the infundibulum, I was able to expose the cystic duct.  A window was created behind the cystic duct.  Similarly, the cystic artery was identified.  Due to thickened nature, I introduced a large Kocher clamp. Therefore, the epigastric 11-mm trocar was exchanged for a 12-mm trocar without difficulty.  The cystic duct was ligated with 3 EndoClips proximally and one distally, and divided between the 2 most distal clips.  Similarly, the cystic artery was ligated with 2 EndoClips proximally and 1 distally and the cystic artery was divided between the 2 most distal clips.  At this time, the gallbladder was dissected off the gallbladder fossa using electrocautery.  Once it was free, it was placed into an EndoCatch bag and placed up and over the right lobe of the liver.  The gallbladder fossa was extremely raw.  Hemostasis was obtained using electrocautery although the surface was noted to be quite raw.  I did opt to place some Surgicel SNoW x2 into the gallbladder fossa.  With hemostasis obtained, I then turned my attention to closure.  Using an Endoclose suture passing device, 2-0 Vicryl was passed through the epigastric trocar site.  Due to the significant drainage, I did opt to place a 10 flat JP drain into the gallbladder fossa.  This was placed into the gallbladder fossa and retrieved through the 5-mm lateral trocar site.  With these all in place, I retrieved the gallbladder and removed it through the umbilical trocar site in an intact EndoCatch bag.  It was placed on the back table and was sent as a permanent specimen to pathology.  At this point, all trocars were removed.  The Vicryl sutures were secured.  Additional 0 Vicryl suture was utilized to close the Hasson trocar site.  Local anesthetic was instilled.  Skin staples were utilized to reapproximate the skin in all 3 trocar sites.  The JP drain was secured  to the skin with a 3-0 nylon.  Gauze dressings, 2 x 2, were placed over all trocar sites.  A drain dressing was placed around the JP drain.  Drapes were removed.  The patient was allowed to come out of general anesthetic and transferred back to regular hospital bed in stable condition.  At the conclusion of the procedure, all instrument, sponge, and needle counts were correct.  The patient tolerated the procedure extremely well.     [name] Pillar, MD     BZ/MEDQ  D:  11/12/2010  T:  11/13/2010  Job:  [id number]  cc:   [name]. [name] Sleet, MD Fax: [phone]  Electronically Signed by [name] Pillar MD on 11/17/2010 11:57:58 AM

## 2010-11-18 LAB — CARDIAC PANEL(CRET KIN+CKTOT+MB+TROPI): CK, MB: 3.3 ng/mL (ref 0.3–4.0)

## 2010-11-29 ENCOUNTER — Inpatient Hospital Stay (HOSPITAL_COMMUNITY): Payer: Managed Care, Other (non HMO)

## 2010-11-29 ENCOUNTER — Emergency Department (HOSPITAL_COMMUNITY): Payer: Managed Care, Other (non HMO)

## 2010-11-29 ENCOUNTER — Inpatient Hospital Stay (HOSPITAL_COMMUNITY)
Admission: EM | Admit: 2010-11-29 | Discharge: 2010-12-13 | DRG: 330 | Disposition: A | Discharge status: Home / Self Care | Payer: Managed Care, Other (non HMO) | Attending: Internal Medicine | Admitting: Internal Medicine

## 2010-11-29 DIAGNOSIS — D61818 Other pancytopenia: Secondary | ICD-10-CM | POA: Diagnosis present

## 2010-11-29 DIAGNOSIS — E860 Dehydration: Secondary | ICD-10-CM | POA: Diagnosis present

## 2010-11-29 DIAGNOSIS — N179 Acute kidney failure, unspecified: Secondary | ICD-10-CM | POA: Diagnosis present

## 2010-11-29 DIAGNOSIS — C20 Malignant neoplasm of rectum: Secondary | ICD-10-CM | POA: Diagnosis present

## 2010-11-29 DIAGNOSIS — R197 Diarrhea, unspecified: Secondary | ICD-10-CM | POA: Diagnosis present

## 2010-11-29 DIAGNOSIS — E871 Hypo-osmolality and hyponatremia: Secondary | ICD-10-CM | POA: Diagnosis present

## 2010-11-29 DIAGNOSIS — D539 Nutritional anemia, unspecified: Secondary | ICD-10-CM

## 2010-11-29 DIAGNOSIS — R112 Nausea with vomiting, unspecified: Secondary | ICD-10-CM | POA: Diagnosis present

## 2010-11-29 DIAGNOSIS — K56609 Unspecified intestinal obstruction, unspecified as to partial versus complete obstruction: Secondary | ICD-10-CM | POA: Diagnosis present

## 2010-11-29 DIAGNOSIS — K565 Intestinal adhesions [bands], unspecified as to partial versus complete obstruction: Principal | ICD-10-CM | POA: Diagnosis present

## 2010-11-29 DIAGNOSIS — Z85048 Personal history of other malignant neoplasm of rectum, rectosigmoid junction, and anus: Secondary | ICD-10-CM

## 2010-11-29 DIAGNOSIS — C7800 Secondary malignant neoplasm of unspecified lung: Secondary | ICD-10-CM | POA: Diagnosis present

## 2010-11-29 HISTORY — DX: Other pancytopenia: D61.818

## 2010-11-29 LAB — COMPREHENSIVE METABOLIC PANEL
ALT: 175 U/L — ABNORMAL HIGH (ref 0–53)
Alkaline Phosphatase: 106 U/L (ref 39–117)
GFR calc Af Amer: 29 mL/min — ABNORMAL LOW (ref >60)
Glucose, Bld: 111 mg/dL — ABNORMAL HIGH (ref 70–99)
Potassium: 3.6 mEq/L (ref 3.5–5.1)
Sodium: 121 mEq/L — ABNORMAL LOW (ref 135–145)
Total Protein: 7.2 g/dL (ref 6.0–8.3)

## 2010-11-29 LAB — CBC
HCT: 35.4 % — ABNORMAL LOW (ref 39.0–52.0)
Hemoglobin: 12.7 g/dL — ABNORMAL LOW (ref 13.0–17.0)
MCH: 30.2 pg (ref 26.0–34.0)
MCHC: 35.9 g/dL (ref 30.0–36.0)
MCV: 84.3 fL (ref 78.0–100.0)

## 2010-11-29 LAB — URINALYSIS, ROUTINE W REFLEX MICROSCOPIC
Glucose, UA: NEGATIVE mg/dL
Hgb urine dipstick: NEGATIVE
Specific Gravity, Urine: 1.025 (ref 1.005–1.030)
pH: 5.5 (ref 5.0–8.0)

## 2010-11-29 LAB — DIFFERENTIAL
Basophils Absolute: 0 10*3/uL (ref 0.0–0.1)
Lymphocytes Relative: 7 % — ABNORMAL LOW (ref 12–46)
Lymphs Abs: 0.6 10*3/uL — ABNORMAL LOW (ref 0.7–4.0)
Monocytes Absolute: 0.6 10*3/uL (ref 0.1–1.0)
Monocytes Relative: 7 % (ref 3–12)
Neutro Abs: 7 10*3/uL (ref 1.7–7.7)

## 2010-11-29 LAB — CARDIAC PANEL(CRET KIN+CKTOT+MB+TROPI): Relative Index: INVALID (ref 0.0–2.5)

## 2010-11-29 LAB — CK TOTAL AND CKMB (NOT AT ARMC): CK, MB: 3 ng/mL (ref 0.3–4.0)

## 2010-11-30 ENCOUNTER — Inpatient Hospital Stay (HOSPITAL_COMMUNITY): Payer: Managed Care, Other (non HMO)

## 2010-11-30 LAB — CBC
Hemoglobin: 11.4 g/dL — ABNORMAL LOW (ref 13.0–17.0)
MCH: 30.3 pg (ref 26.0–34.0)
MCHC: 35.3 g/dL (ref 30.0–36.0)
Platelets: 149 10*3/uL — ABNORMAL LOW (ref 150–400)

## 2010-11-30 LAB — CLOSTRIDIUM DIFFICILE BY PCR: Toxigenic C. Difficile by PCR: NEGATIVE

## 2010-11-30 LAB — COMPREHENSIVE METABOLIC PANEL
ALT: 155 U/L — ABNORMAL HIGH (ref 0–53)
AST: 73 U/L — ABNORMAL HIGH (ref 0–37)
Alkaline Phosphatase: 92 U/L (ref 39–117)
CO2: 28 mEq/L (ref 19–32)
Chloride: 90 mEq/L — ABNORMAL LOW (ref 96–112)
Creatinine, Ser: 2.26 mg/dL — ABNORMAL HIGH (ref 0.50–1.35)
GFR calc non Af Amer: 30 mL/min — ABNORMAL LOW (ref >60)
Sodium: 127 mEq/L — ABNORMAL LOW (ref 135–145)
Total Bilirubin: 1 mg/dL (ref 0.3–1.2)

## 2010-11-30 LAB — CARDIAC PANEL(CRET KIN+CKTOT+MB+TROPI)
Relative Index: INVALID (ref 0.0–2.5)
Total CK: 51 U/L (ref 7–232)
Total CK: 44 U/L (ref 7–232)
Troponin I: <0.3 ng/mL (ref <0.30)

## 2010-11-30 LAB — DIFFERENTIAL
Basophils Absolute: 0 10*3/uL (ref 0.0–0.1)
Basophils Relative: 0 % (ref 0–1)
Eosinophils Relative: 1 % (ref 0–5)
Monocytes Absolute: 0.4 10*3/uL (ref 0.1–1.0)

## 2010-12-01 LAB — BASIC METABOLIC PANEL
CO2: 24 mEq/L (ref 19–32)
Calcium: 7.9 mg/dL — ABNORMAL LOW (ref 8.4–10.5)
GFR calc Af Amer: >60 mL/min (ref >60)
GFR calc non Af Amer: 51 mL/min — ABNORMAL LOW (ref >60)
Sodium: 128 mEq/L — ABNORMAL LOW (ref 135–145)

## 2010-12-01 LAB — HEPATIC FUNCTION PANEL
Alkaline Phosphatase: 74 U/L (ref 39–117)
Bilirubin, Direct: 0.3 mg/dL (ref 0.0–0.3)
Indirect Bilirubin: 0.4 mg/dL (ref 0.3–0.9)
Total Protein: 5.2 g/dL — ABNORMAL LOW (ref 6.0–8.3)

## 2010-12-01 LAB — CBC
Platelets: 81 10*3/uL — ABNORMAL LOW (ref 150–400)
RBC: 3.13 MIL/uL — ABNORMAL LOW (ref 4.22–5.81)
RDW: 17.1 % — ABNORMAL HIGH (ref 11.5–15.5)
WBC: 2.8 10*3/uL — ABNORMAL LOW (ref 4.0–10.5)

## 2010-12-01 LAB — DIFFERENTIAL
Basophils Absolute: 0 10*3/uL (ref 0.0–0.1)
Basophils Relative: 0 % (ref 0–1)
Eosinophils Absolute: 0 10*3/uL (ref 0.0–0.7)
Eosinophils Relative: 0 % (ref 0–5)
Lymphs Abs: 0.2 10*3/uL — ABNORMAL LOW (ref 0.7–4.0)
Neutrophils Relative %: 86 % — ABNORMAL HIGH (ref 43–77)

## 2010-12-01 LAB — MAGNESIUM: Magnesium: 2.3 mg/dL (ref 1.5–2.5)

## 2010-12-02 LAB — COMPREHENSIVE METABOLIC PANEL
AST: 26 U/L (ref 0–37)
Albumin: 2.1 g/dL — ABNORMAL LOW (ref 3.5–5.2)
Alkaline Phosphatase: 72 U/L (ref 39–117)
BUN: 23 mg/dL (ref 6–23)
CO2: 25 mEq/L (ref 19–32)
Chloride: 102 mEq/L (ref 96–112)
Creatinine, Ser: 1.4 mg/dL — ABNORMAL HIGH (ref 0.50–1.35)
GFR calc non Af Amer: 52 mL/min — ABNORMAL LOW (ref >60)
Potassium: 4.4 mEq/L (ref 3.5–5.1)
Total Bilirubin: 0.5 mg/dL (ref 0.3–1.2)

## 2010-12-02 LAB — DIFFERENTIAL
Basophils Absolute: 0 10*3/uL (ref 0.0–0.1)
Lymphocytes Relative: 9 % — ABNORMAL LOW (ref 12–46)
Lymphs Abs: 0.2 10*3/uL — ABNORMAL LOW (ref 0.7–4.0)
Neutro Abs: 2.1 10*3/uL (ref 1.7–7.7)
Neutrophils Relative %: 82 % — ABNORMAL HIGH (ref 43–77)

## 2010-12-02 LAB — CBC
HCT: 27.2 % — ABNORMAL LOW (ref 39.0–52.0)
MCV: 88.3 fL (ref 78.0–100.0)
RBC: 3.08 MIL/uL — ABNORMAL LOW (ref 4.22–5.81)
RDW: 17.4 % — ABNORMAL HIGH (ref 11.5–15.5)
WBC: 2.6 10*3/uL — ABNORMAL LOW (ref 4.0–10.5)

## 2010-12-03 LAB — DIFFERENTIAL
Basophils Absolute: 0 10*3/uL (ref 0.0–0.1)
Basophils Relative: 0 % (ref 0–1)
Eosinophils Absolute: 0 10*3/uL (ref 0.0–0.7)
Monocytes Absolute: 0.2 10*3/uL (ref 0.1–1.0)
Monocytes Relative: 7 % (ref 3–12)
Neutro Abs: 2.6 10*3/uL (ref 1.7–7.7)

## 2010-12-03 LAB — CBC
MCH: 30.2 pg (ref 26.0–34.0)
MCHC: 33.8 g/dL (ref 30.0–36.0)
Platelets: 67 10*3/uL — ABNORMAL LOW (ref 150–400)
RDW: 17.8 % — ABNORMAL HIGH (ref 11.5–15.5)

## 2010-12-03 MED ORDER — PANTOPRAZOLE SODIUM 40 MG PO TBEC
40.0000 mg | DELAYED_RELEASE_TABLET | Freq: Every day | ORAL | Status: DC
  Administered 2010-12-04 – 2010-12-13 (×9): 40 mg via ORAL
  Filled 2010-12-03 (×9): qty 1

## 2010-12-03 MED ORDER — CHOLESTYRAMINE 4 G PO PACK
1.0000 | PACK | Freq: Every day | ORAL | Status: DC
  Administered 2010-12-05: 08:00:00 via ORAL
  Filled 2010-12-03 (×5): qty 1

## 2010-12-03 MED ORDER — ACETAMINOPHEN 325 MG PO TABS
650.0000 mg | ORAL_TABLET | ORAL | Status: DC | PRN
  Filled 2010-12-03: qty 2

## 2010-12-03 MED ORDER — ALUM & MAG HYDROXIDE-SIMETH 200-200-20 MG/5ML PO SUSP
30.0000 mL | Freq: Four times a day (QID) | ORAL | Status: DC | PRN
  Filled 2010-12-03: qty 30

## 2010-12-03 MED ORDER — ALBUTEROL SULFATE (2.5 MG/3ML) 0.083% IN NEBU: 2.5000 mg | INHALATION_SOLUTION | Freq: Four times a day (QID) | RESPIRATORY_TRACT | Status: DC | PRN

## 2010-12-03 MED ORDER — SODIUM CHLORIDE 0.9 % IJ SOLN: 3.0000 mL | Freq: Two times a day (BID) | INTRAMUSCULAR | Status: DC

## 2010-12-03 MED ORDER — ONDANSETRON HCL 4 MG/2ML IJ SOLN
4.0000 mg | Freq: Four times a day (QID) | INTRAMUSCULAR | Status: DC | PRN
  Administered 2010-12-04 – 2010-12-12 (×8): 4 mg via INTRAVENOUS
  Filled 2010-12-03 (×11): qty 2

## 2010-12-03 MED ORDER — PROMETHAZINE HCL 25 MG/ML IJ SOLN
6.2500 mg | Freq: Four times a day (QID) | INTRAMUSCULAR | Status: DC | PRN
  Administered 2010-12-07 – 2010-12-10 (×4): 6.25 mg via INTRAVENOUS
  Filled 2010-12-03 (×4): qty 1

## 2010-12-03 MED ORDER — SIMETHICONE 80 MG PO CHEW: 80.0000 mg | CHEWABLE_TABLET | Freq: Two times a day (BID) | ORAL | Status: DC

## 2010-12-03 MED ORDER — SODIUM CHLORIDE 0.9 % IV SOLN: INTRAVENOUS | Status: DC

## 2010-12-03 MED ORDER — MORPHINE SULFATE 2 MG/ML IJ SOLN
2.0000 mg | INTRAMUSCULAR | Status: DC | PRN
  Administered 2010-12-04 – 2010-12-06 (×3): 2 mg via INTRAVENOUS
  Administered 2010-12-07: 4 mg via INTRAVENOUS
  Administered 2010-12-07: 2 mg via INTRAVENOUS
  Administered 2010-12-08: 4 mg via INTRAVENOUS
  Administered 2010-12-08 – 2010-12-12 (×6): 2 mg via INTRAVENOUS
  Filled 2010-12-03 (×9): qty 1
  Filled 2010-12-03: qty 2
  Filled 2010-12-03 (×2): qty 1
  Filled 2010-12-03: qty 2
  Filled 2010-12-03: qty 1
  Filled 2010-12-03: qty 2
  Filled 2010-12-03: qty 1

## 2010-12-04 ENCOUNTER — Inpatient Hospital Stay (HOSPITAL_COMMUNITY): Payer: Managed Care, Other (non HMO)

## 2010-12-04 ENCOUNTER — Encounter (HOSPITAL_COMMUNITY): Payer: Self-pay | Admitting: Internal Medicine

## 2010-12-04 DIAGNOSIS — N179 Acute kidney failure, unspecified: Secondary | ICD-10-CM | POA: Diagnosis present

## 2010-12-04 DIAGNOSIS — D61818 Other pancytopenia: Secondary | ICD-10-CM

## 2010-12-04 DIAGNOSIS — E860 Dehydration: Secondary | ICD-10-CM | POA: Diagnosis present

## 2010-12-04 HISTORY — DX: Other pancytopenia: D61.818

## 2010-12-04 LAB — BASIC METABOLIC PANEL
CO2: 27 mEq/L (ref 19–32)
Chloride: 103 mEq/L (ref 96–112)
GFR calc non Af Amer: 55 mL/min — ABNORMAL LOW (ref >60)
Glucose, Bld: 74 mg/dL (ref 70–99)
Potassium: 4.3 mEq/L (ref 3.5–5.1)
Sodium: 136 mEq/L (ref 135–145)

## 2010-12-04 LAB — DIFFERENTIAL
Eosinophils Absolute: 0 10*3/uL (ref 0.0–0.7)
Lymphocytes Relative: 10 % — ABNORMAL LOW (ref 12–46)
Lymphs Abs: 0.5 10*3/uL — ABNORMAL LOW (ref 0.7–4.0)
Neutro Abs: 3.7 10*3/uL (ref 1.7–7.7)
Neutrophils Relative %: 82 % — ABNORMAL HIGH (ref 43–77)

## 2010-12-04 LAB — CBC
Platelets: 88 10*3/uL — ABNORMAL LOW (ref 150–400)
RBC: 3.49 MIL/uL — ABNORMAL LOW (ref 4.22–5.81)
WBC: 4.5 10*3/uL (ref 4.0–10.5)

## 2010-12-04 MED ORDER — SODIUM CHLORIDE 0.9 % IV SOLN
INTRAVENOUS | Status: DC
  Administered 2010-12-04 – 2010-12-06 (×3): via INTRAVENOUS

## 2010-12-04 NOTE — Progress Notes (Signed)
  Subjective: Episode of emesis this AM.  Large non-bloody.  Odorous.  Continues to have Loose stool.  No abd pain.  No fevers.  Objective: Vital signs in last 24 hours: Temp:  [97.4 F (36.3 C)-97.7 F (36.5 C)] 97.7 F (36.5 C) (07/07 0500) Pulse Rate:  [66-71] 66  (07/07 0500) Resp:  [16-20] 20  (07/07 0500) BP: (106)/(72-73) 106/72 mmHg (07/07 0500) SpO2:  [98 %-99 %] 99 % (07/07 0500) Last BM Date: 12/04/10  Intake/Output from previous day:   Intake/Output this shift:    General appearance: alert, no distress and pleasant Eyes: PERR, EOMI, No conj palor GI: normal findings: bowel sounds normal, soft, non-tender and no peritoneal signs and abnormal findings:  distended  Lab Results:  @LABLAST2 (wbc:2,hgb:2,hct:2,plt:2) BMET  Basename 12/04/10 0450 12/02/10 0539  NA 136 131*  K 4.3 4.4  CL 103 102  CO2 27 25  GLUCOSE 74 73  BUN 17 23  CREATININE 1.33 1.40*  CALCIUM 8.7 8.0*   PT/INR No results found for this basename: LABPROT:2,INR:2 in the last 72 hours ABG No results found for this basename: PHART:2,PCO2:2,PO2:2,HCO3:2 in the last 72 hours  Studies/Results: No results found.  Anti-infectives: Anti-infectives    None      Assessment/Plan: s/p  Partial SBO.  Will check gastrograffin enema to re-eval the lower anast.  Patient is too far out for this inflammation to be post-operative.  Bx were negative at last flex sig however so recurrence is less suspisious.  Discussed with patient family,ex lap and possible diversion.  Not a candidate to re-do the anast due to the distal location.  Diversion could be either temp or paliative give patient progression.  Will plan for rad study today and possible OR Monday.  LOS: 5 days    Kervin Bones C 12/04/2010

## 2010-12-04 NOTE — Progress Notes (Signed)
Subjective: Started vomiting last night and more diarrhea. No pain. No bleeding. No bloating.  Objective: Vital signs in last 24 hours: Temp:  [97.4 F (36.3 C)-97.7 F (36.5 C)] 97.7 F (36.5 C) (07/07 0500) Pulse Rate:  [66-71] 66  (07/07 0500) Resp:  [16-20] 20  (07/07 0500) BP: (106)/(72-73) 106/72 mmHg (07/07 0500) SpO2:  [98 %-99 %] 99 % (07/07 0500) Weight change:     Intake/Output from previous day:  Not recorded Intake/Output this shift:  Not recorded  General appearance: pale Resp: clear to auscultation bilaterally without wheezes, rhonchi, or rales  Cardio: regular rate and rhythm, S1, S2 normal, no murmur, click, rub or gallop GI: Hypoactive bowel sounds. Soft, Nontender  Extremities:  No clubbing cyanosis or edema   Lab Results:  Basename 12/04/10 0450 12/03/10 0609  WBC 4.5 3.1*  HGB 10.6* 9.2*  HCT 31.4* 27.2*  PLT 88* 67*   BMET  Basename 12/04/10 0450 12/02/10 0539  NA 136 131*  K 4.3 4.4  CL 103 102  CO2 27 25  GLUCOSE 74 73  BUN 17 23  CREATININE 1.33 1.40*  CALCIUM 8.7 8.0*    Studies/Results: No results found.  Medications: I have reviewed the patient's current medications.  Assessment/Plan: 1. Vomiting and Diarrhea has returned.  Will check abdominal films, resume IVF and discuss with Dr. Dian Situ. Change to sips of clears. 2. Pancytopenia improving 3. Renal failure and dehydration resolved.   Alfonzo Arca L 12/04/2010, 8:48 AM

## 2010-12-05 LAB — CBC
Hemoglobin: 9.5 g/dL — ABNORMAL LOW (ref 13.0–17.0)
MCH: 30.7 pg (ref 26.0–34.0)
Platelets: 61 10*3/uL — ABNORMAL LOW (ref 150–400)
RBC: 3.09 MIL/uL — ABNORMAL LOW (ref 4.22–5.81)
WBC: 3.2 10*3/uL — ABNORMAL LOW (ref 4.0–10.5)

## 2010-12-05 LAB — BASIC METABOLIC PANEL
CO2: 26 mEq/L (ref 19–32)
Calcium: 8 mg/dL — ABNORMAL LOW (ref 8.4–10.5)
Chloride: 103 mEq/L (ref 96–112)
Glucose, Bld: 76 mg/dL (ref 70–99)
Potassium: 4 mEq/L (ref 3.5–5.1)
Sodium: 133 mEq/L — ABNORMAL LOW (ref 135–145)

## 2010-12-05 MED ORDER — SODIUM CHLORIDE 0.9 % IJ SOLN
INTRAMUSCULAR | Status: AC
  Filled 2010-12-05: qty 10

## 2010-12-05 NOTE — Progress Notes (Addendum)
Subjective: Vomiting resolved. Loose stool continues.  Tolerating clears  Objective: Vital signs in last 24 hours: Temp:  [97.3 F (36.3 C)-98.3 F (36.8 C)] 98.3 F (36.8 C) (07/08 1000) Pulse Rate:  [63-84] 63  (07/08 1000) Resp:  [18-20] 20  (07/08 1000) BP: (94-116)/(61-77) 94/67 mmHg (07/08 1000) SpO2:  [97 %-100 %] 97 % (07/08 1000) Weight change:  Last BM Date: 12/04/10  Intake/Output from previous day: 07/07 0701 - 07/08 0700 In: 1533 [P.O.:120; I.V.:1413] Out: -  Intake/Output this shift: I/O this shift: In: 720 [P.O.:720] Out: -   General appearance: More comfortable Resp: clear to auscultation bilaterally without wheezes, rhonchi, or rales  Cardio: regular rate and rhythm, S1, S2 normal, no murmur, click, rub or gallop GI: Normal bowel sounds. Soft, Nontender  Extremities:  No clubbing cyanosis or edema   Lab Results:  Basename 12/05/10 0427 12/04/10 0450  WBC 3.2* 4.5  HGB 9.5* 10.6*  HCT 27.6* 31.4*  PLT 61* 88*   BMET  Basename 12/05/10 0427 12/04/10 0450  NA 133* 136  K 4.0 4.3  CL 103 103  CO2 26 27  GLUCOSE 76 74  BUN 18 17  CREATININE 1.27 1.33  CALCIUM 8.0* 8.7    Studies/Results:  12/04/2010  *RADIOLOGY REPORT*  Clinical Data: Vomiting, evaluate for obstruction  ABDOMEN - 2 VIEW  Comparison: CT dated 11/30/2010  Findings: The patient is status post subtotal colectomy with ileocolonic anastomosis.  Again seen are dilated loops of small bowel with air-fluid levels on the upright view, suspicious for small bowel obstruction.  No evidence of free air under the diaphragm on the upright view.  Surgical clips in the right upper abdomen.  Central venous catheter with its tip in the right mediastinum, incompletely visualized.  IMPRESSION: Dilated loops of small bowel, suspicious for small bowel obstruction, unchanged from recent CT.  Original Report Authenticated By: Charline Bills, M.D.   Medications: I have reviewed the patient's current  medications.  Assessment/Plan: 1. PSBO:  For gastrograffin enema, then probably diversion 2. Pancytopenia  3. Renal failure and dehydration resolved. 4. Metastatic colon cancer   Sharlet Notaro L 12/05/2010, 12:46 PM

## 2010-12-05 NOTE — Progress Notes (Signed)
  Subjective: No significant change.  No nausea.  Some bloating   Objective: Vital signs in last 24 hours: Temp:  [97.3 F (36.3 C)-98.3 F (36.8 C)] 98 F (36.7 C) (07/08 1300) Pulse Rate:  [63-84] 84  (07/08 1340) Resp:  [18-20] 20  (07/08 1340) BP: (94-116)/(61-77) 94/67 mmHg (07/08 1000) SpO2:  [97 %-100 %] 98 % (07/08 1340) Last BM Date: 12/04/10  Intake/Output from previous day: 07/07 0701 - 07/08 0700 In: 1533 [P.O.:120; I.V.:1413] Out: -  Intake/Output this shift: I/O this shift: In: 720 [P.O.:720] Out: -   General appearance: alert, cooperative and no distress GI: normal findings: bowel sounds normal and soft, non-tender and abnormal findings:  distended  Lab Results:  @LABLAST2 (wbc:2,hgb:2,hct:2,plt:2) BMET  Basename 12/05/10 0427 12/04/10 0450  NA 133* 136  K 4.0 4.3  CL 103 103  CO2 26 27  GLUCOSE 76 74  BUN 18 17  CREATININE 1.27 1.33  CALCIUM 8.0* 8.7   PT/INR No results found for this basename: LABPROT:2,INR:2 in the last 72 hours ABG No results found for this basename: PHART:2,PCO2:2,PO2:2,HCO3:2 in the last 72 hours  Studies/Results: No results found.  Anti-infectives: Anti-infectives    None      Assessment/Plan: s/p  pSBO.  Continue limited po.  contrast enema planned for tomorrow.  Likely diversion procedure Tuesday pending rad findings.  LOS: 6 days    Garner Dullea C 12/05/2010

## 2010-12-06 ENCOUNTER — Other Ambulatory Visit: Payer: Self-pay

## 2010-12-06 ENCOUNTER — Other Ambulatory Visit (HOSPITAL_COMMUNITY): Payer: Self-pay | Admitting: Oncology

## 2010-12-06 ENCOUNTER — Inpatient Hospital Stay (HOSPITAL_COMMUNITY): Payer: Managed Care, Other (non HMO)

## 2010-12-06 DIAGNOSIS — C20 Malignant neoplasm of rectum: Secondary | ICD-10-CM

## 2010-12-06 LAB — COMPREHENSIVE METABOLIC PANEL
ALT: 35 U/L (ref 0–53)
BUN: 16 mg/dL (ref 6–23)
CO2: 26 mEq/L (ref 19–32)
Calcium: 8.3 mg/dL — ABNORMAL LOW (ref 8.4–10.5)
GFR calc Af Amer: >60 mL/min (ref >60)
GFR calc non Af Amer: >60 mL/min (ref >60)
Glucose, Bld: 82 mg/dL (ref 70–99)
Sodium: 136 mEq/L (ref 135–145)
Total Protein: 5.1 g/dL — ABNORMAL LOW (ref 6.0–8.3)

## 2010-12-06 LAB — DIFFERENTIAL
Eosinophils Absolute: 0 10*3/uL (ref 0.0–0.7)
Eosinophils Relative: 1 % (ref 0–5)
Lymphocytes Relative: 10 % — ABNORMAL LOW (ref 12–46)
Lymphs Abs: 0.4 10*3/uL — ABNORMAL LOW (ref 0.7–4.0)
Monocytes Absolute: 0.2 10*3/uL (ref 0.1–1.0)
Monocytes Relative: 6 % (ref 3–12)

## 2010-12-06 LAB — CBC
HCT: 29.5 % — ABNORMAL LOW (ref 39.0–52.0)
Hemoglobin: 10.1 g/dL — ABNORMAL LOW (ref 13.0–17.0)
MCH: 30.9 pg (ref 26.0–34.0)
MCV: 90.2 fL (ref 78.0–100.0)
RBC: 3.27 MIL/uL — ABNORMAL LOW (ref 4.22–5.81)
WBC: 3.8 10*3/uL — ABNORMAL LOW (ref 4.0–10.5)

## 2010-12-06 MED ORDER — DEXTROSE 5 % IV SOLN
2.0000 g | INTRAVENOUS | Status: DC
  Filled 2010-12-06: qty 2

## 2010-12-06 MED ORDER — SODIUM CHLORIDE 0.9 % IJ SOLN
INTRAMUSCULAR | Status: AC
  Administered 2010-12-06: 16:00:00
  Filled 2010-12-06: qty 10

## 2010-12-06 MED ORDER — ENOXAPARIN SODIUM 40 MG/0.4ML ~~LOC~~ SOLN
40.0000 mg | Freq: Once | SUBCUTANEOUS | Status: AC
  Administered 2010-12-06: 40 mg via SUBCUTANEOUS
  Filled 2010-12-06: qty 0.4

## 2010-12-06 NOTE — Progress Notes (Addendum)
  Subjective: Vomiting resolved. Loose stool continues.  Tolerating clears  Objective: Vital signs in last 24 hours: Temp:  [97.7 F (36.5 C)-98.7 F (37.1 C)] 97.7 F (36.5 C) (07/09 0528) Pulse Rate:  [69-78] 69  (07/09 0528) Resp:  [18-19] 18  (07/09 0528) BP: (90-93)/(61) 90/61 mmHg (07/09 0528) SpO2:  [91 %-98 %] 98 % (07/09 0528) Weight change:  Last BM Date: 12/06/10  Intake/Output from previous day: 07/08 0701 - 07/09 0700 In: 3066.7 [P.O.:1680; I.V.:1386.7] Out: -  Intake/Output this shift: I/O this shift: In: 250 [P.O.:250] Out: -   General appearance: More comfortable Resp: clear to auscultation bilaterally without wheezes, rhonchi, or rales  Cardio: regular rate and rhythm, S1, S2 normal, no murmur, click, rub or gallop GI: Hyperactive bowel sounds. Distended. Nontender. Extremities:  No clubbing cyanosis or edema   Lab Results:  Basename 12/05/10 0427 12/04/10 0450  WBC 3.2* 4.5  HGB 9.5* 10.6*  HCT 27.6* 31.4*  PLT 61* 88*   BMET  Basename 12/05/10 0427 12/04/10 0450  NA 133* 136  K 4.0 4.3  CL 103 103  CO2 26 27  GLUCOSE 76 74  BUN 18 17  CREATININE 1.27 1.33  CALCIUM 8.0* 8.7    Studies/Results: *RADIOLOGY REPORT*  Clinical Data: Carcinoma of the colon post sub total colectomy with  ileocolic anastomoses, an anastomotic obstruction and bowel  dilatation  WATER SOLUBLE ENEMA:  Technique: After insertion of an enema tip, a diagnostic enema was  performed utilizing water soluble contrast material. Contrast was  administered retrograde into the colon by gravity. Multiple images  were obtained.  Total fluoroscopy time: 1.5 minutes  Comparison: Abdominal radiograph 12/04/2010  Findings:  Marked dilatation of small bowel loops compatible with obstruction.  Loops measure up to 10 cm in transverse diameter.  No rectal gas identified.  After insertion of an enema tip, water-soluble contrast material  (Omnipaque-300) dilute with a small amount  of water was instilled  retrograde.  Marked narrowing of bowel wall at ileocolic anastomosis, with  significant distention of small bowel to this site.  Area of narrowing is irregular and recurrent tumor cannot be  excluded radiographically.  More proximally the small bowel is significantly distended without  mass or filling defect.  No bowel wall thickening or contrast extravasation.  Procedure tolerated well by patient.  IMPRESSION:  High-grade obstruction at the ileocolic anastomosis with marked  distention of small bowel loops proximal to anastomoses.  The area of stricture is irregular, which could be result of edema  or wall thickening but recurrent tumor is not excluded with this  appearance.  Original Report Authenticated By: Lollie Marrow, M.D.    Medications: I have reviewed the patient's current medications.  Assessment/Plan: 1. PSBO:  For diverting procedure tomorrow. 2. Pancytopenia. Check labs in am 3. Renal failure and dehydration resolved. 4. Metastatic colon cancer   Connie Hilgert L 12/06/2010, 3:05 PM

## 2010-12-06 NOTE — Progress Notes (Signed)
  Subjective: Some bloating no acute change.  Objective: Vital signs in last 24 hours: Temp:  [97.7 F (36.5 C)-98.7 F (37.1 C)] 97.7 F (36.5 C) (07/09 0528) Pulse Rate:  [65-84] 69  (07/09 0528) Resp:  [18-20] 18  (07/09 0528) BP: (90-93)/(61-62) 90/61 mmHg (07/09 0528) SpO2:  [91 %-98 %] 98 % (07/09 0528) Last BM Date: 12/05/10  Intake/Output from previous day: 07/08 0701 - 07/09 0700 In: 3066.7 [P.O.:1680; I.V.:1386.7] Out: -  Intake/Output this shift: I/O this shift: In: 200 [P.O.:200] Out: -   General appearance: alert, cooperative and no distress GI: normal findings: bowel sounds normal and soft, non-tender and abnormal findings:  distended  Lab Results:  @LABLAST2 (wbc:2,hgb:2,hct:2,plt:2) BMET  Basename 12/05/10 0427 12/04/10 0450  NA 133* 136  K 4.0 4.3  CL 103 103  CO2 26 27  GLUCOSE 76 74  BUN 18 17  CREATININE 1.27 1.33  CALCIUM 8.0* 8.7   PT/INR No results found for this basename: LABPROT:2,INR:2 in the last 72 hours ABG No results found for this basename: PHART:2,PCO2:2,PO2:2,HCO3:2 in the last 72 hours  Studies/Results: No results found.  Anti-infectives: Anti-infectives    None      Assessment/Plan: s/p  Continue current regiment.  To OR tom for SB diversion.    LOS: 7 days    [name] C 12/06/2010

## 2010-12-07 ENCOUNTER — Other Ambulatory Visit (HOSPITAL_COMMUNITY): Payer: Self-pay | Admitting: Oncology

## 2010-12-07 ENCOUNTER — Encounter (HOSPITAL_COMMUNITY): Payer: Self-pay | Admitting: Oncology

## 2010-12-07 ENCOUNTER — Encounter (HOSPITAL_COMMUNITY): Payer: Self-pay | Admitting: Anesthesiology

## 2010-12-07 ENCOUNTER — Encounter (HOSPITAL_COMMUNITY): Payer: Self-pay | Admitting: *Deleted

## 2010-12-07 ENCOUNTER — Other Ambulatory Visit: Payer: Self-pay | Admitting: General Surgery

## 2010-12-07 ENCOUNTER — Inpatient Hospital Stay (HOSPITAL_COMMUNITY): Payer: Managed Care, Other (non HMO) | Admitting: Anesthesiology

## 2010-12-07 ENCOUNTER — Encounter (HOSPITAL_COMMUNITY): Admission: EM | Disposition: A | Discharge status: Home / Self Care | Payer: Self-pay | Source: Home / Self Care

## 2010-12-07 DIAGNOSIS — C20 Malignant neoplasm of rectum: Secondary | ICD-10-CM

## 2010-12-07 DIAGNOSIS — E538 Deficiency of other specified B group vitamins: Secondary | ICD-10-CM

## 2010-12-07 HISTORY — DX: Deficiency of other specified B group vitamins: E53.8

## 2010-12-07 HISTORY — PX: ILEOSTOMY: SHX1783

## 2010-12-07 LAB — CBC
Hemoglobin: 11.8 g/dL — ABNORMAL LOW (ref 13.0–17.0)
MCH: 30.9 pg (ref 26.0–34.0)
MCHC: 34.2 g/dL (ref 30.0–36.0)
MCV: 90.3 fL (ref 78.0–100.0)
Platelets: 95 10*3/uL — ABNORMAL LOW (ref 150–400)

## 2010-12-07 LAB — PREPARE RBC (CROSSMATCH)

## 2010-12-07 LAB — CREATININE, SERUM: Creatinine, Ser: 1.73 mg/dL — ABNORMAL HIGH (ref 0.50–1.35)

## 2010-12-07 SURGERY — CREATION, ILEOSTOMY
Anesthesia: General | Wound class: Contaminated

## 2010-12-07 MED ORDER — FENTANYL CITRATE 0.05 MG/ML IJ SOLN
INTRAMUSCULAR | Status: DC | PRN
  Administered 2010-12-07 (×7): 50 ug via INTRAVENOUS

## 2010-12-07 MED ORDER — ONDANSETRON HCL 4 MG/2ML IJ SOLN
4.0000 mg | Freq: Once | INTRAMUSCULAR | Status: AC | PRN
  Administered 2010-12-07: 4 mg via INTRAVENOUS

## 2010-12-07 MED ORDER — FENTANYL CITRATE 0.05 MG/ML IJ SOLN
INTRAMUSCULAR | Status: AC
  Administered 2010-12-07: 50 ug via INTRAVENOUS
  Filled 2010-12-07: qty 2

## 2010-12-07 MED ORDER — PROPOFOL 10 MG/ML IV EMUL
INTRAVENOUS | Status: AC
  Filled 2010-12-07: qty 20

## 2010-12-07 MED ORDER — FENTANYL CITRATE 0.05 MG/ML IJ SOLN
25.0000 ug | INTRAMUSCULAR | Status: DC | PRN
  Administered 2010-12-07 (×4): 50 ug via INTRAVENOUS

## 2010-12-07 MED ORDER — DEXTROSE 5 % IV SOLN
1.0000 g | INTRAVENOUS | Status: DC | PRN
  Administered 2010-12-07: 2 g via INTRAVENOUS

## 2010-12-07 MED ORDER — GLYCOPYRROLATE 0.2 MG/ML IJ SOLN
INTRAMUSCULAR | Status: DC | PRN
  Administered 2010-12-07: .6 mg via INTRAVENOUS

## 2010-12-07 MED ORDER — GLYCOPYRROLATE 0.2 MG/ML IJ SOLN
INTRAMUSCULAR | Status: AC
  Filled 2010-12-07: qty 2

## 2010-12-07 MED ORDER — ENOXAPARIN SODIUM 40 MG/0.4ML ~~LOC~~ SOLN
40.0000 mg | SUBCUTANEOUS | Status: DC
  Administered 2010-12-07 – 2010-12-10 (×4): 40 mg via SUBCUTANEOUS
  Filled 2010-12-07 (×4): qty 0.4

## 2010-12-07 MED ORDER — ROCURONIUM BROMIDE 100 MG/10ML IV SOLN
INTRAVENOUS | Status: DC | PRN
  Administered 2010-12-07: 5 mg via INTRAVENOUS
  Administered 2010-12-07: 25 mg via INTRAVENOUS
  Administered 2010-12-07 (×4): 10 mg via INTRAVENOUS

## 2010-12-07 MED ORDER — LIDOCAINE HCL 1 % IJ SOLN
INTRAMUSCULAR | Status: DC | PRN
  Administered 2010-12-07: 10 mg via INTRADERMAL

## 2010-12-07 MED ORDER — LACTATED RINGERS IV SOLN
INTRAVENOUS | Status: DC | PRN
  Administered 2010-12-07 (×2): via INTRAVENOUS

## 2010-12-07 MED ORDER — NEOSTIGMINE METHYLSULFATE 1 MG/ML IJ SOLN
INTRAMUSCULAR | Status: DC | PRN
  Administered 2010-12-07: 3 mg via INTRAMUSCULAR

## 2010-12-07 MED ORDER — MIDAZOLAM HCL 2 MG/2ML IJ SOLN
1.0000 mg | INTRAMUSCULAR | Status: DC | PRN
  Administered 2010-12-07: 2 mg via INTRAVENOUS

## 2010-12-07 MED ORDER — LIDOCAINE HCL (PF) 1 % IJ SOLN
INTRAMUSCULAR | Status: AC
  Filled 2010-12-07: qty 5

## 2010-12-07 MED ORDER — SUCCINYLCHOLINE CHLORIDE 20 MG/ML IJ SOLN
INTRAMUSCULAR | Status: AC
  Filled 2010-12-07: qty 1

## 2010-12-07 MED ORDER — ROCURONIUM BROMIDE 50 MG/5ML IV SOLN
INTRAVENOUS | Status: AC
  Filled 2010-12-07: qty 1

## 2010-12-07 MED ORDER — LACTATED RINGERS IV SOLN
INTRAVENOUS | Status: DC
  Administered 2010-12-07: 1000 mL via INTRAVENOUS

## 2010-12-07 MED ORDER — FENTANYL CITRATE 0.05 MG/ML IJ SOLN: 25.0000 ug | INTRAMUSCULAR | Status: DC | PRN

## 2010-12-07 MED ORDER — FENTANYL CITRATE 0.05 MG/ML IJ SOLN
INTRAMUSCULAR | Status: AC
  Filled 2010-12-07: qty 2

## 2010-12-07 MED ORDER — NEOSTIGMINE METHYLSULFATE 1 MG/ML IJ SOLN
INTRAMUSCULAR | Status: AC
  Filled 2010-12-07: qty 10

## 2010-12-07 MED ORDER — PROPOFOL 10 MG/ML IV EMUL
INTRAVENOUS | Status: DC | PRN
  Administered 2010-12-07: 130 mg via INTRAVENOUS

## 2010-12-07 MED ORDER — ONDANSETRON HCL 4 MG/2ML IJ SOLN
INTRAMUSCULAR | Status: AC
  Administered 2010-12-07: 4 mg via INTRAVENOUS
  Filled 2010-12-07: qty 2

## 2010-12-07 MED ORDER — SODIUM CHLORIDE 0.9 % IV SOLN
500.0000 mL | Freq: Once | INTRAVENOUS | Status: AC
  Administered 2010-12-07: 500 mL via INTRAVENOUS

## 2010-12-07 MED ORDER — ONDANSETRON HCL 4 MG/2ML IJ SOLN
INTRAMUSCULAR | Status: AC
  Administered 2010-12-08: 4 mg via INTRAVENOUS
  Filled 2010-12-07: qty 2

## 2010-12-07 MED ORDER — FENTANYL CITRATE 0.05 MG/ML IJ SOLN
25.0000 ug | INTRAMUSCULAR | Status: DC | PRN
  Administered 2010-12-07 (×2): 50 ug via INTRAVENOUS

## 2010-12-07 MED ORDER — MIDAZOLAM HCL 2 MG/2ML IJ SOLN
INTRAMUSCULAR | Status: AC
  Filled 2010-12-07: qty 2

## 2010-12-07 MED ORDER — FENTANYL CITRATE 0.05 MG/ML IJ SOLN
INTRAMUSCULAR | Status: AC
  Filled 2010-12-07: qty 5

## 2010-12-07 MED ORDER — HYDROCODONE-ACETAMINOPHEN 5-325 MG PO TABS
1.0000 | ORAL_TABLET | ORAL | Status: DC | PRN
  Administered 2010-12-12 (×3): 1 via ORAL
  Administered 2010-12-13: 2 via ORAL
  Filled 2010-12-07 (×2): qty 1
  Filled 2010-12-07: qty 2
  Filled 2010-12-07 (×2): qty 1

## 2010-12-07 SURGICAL SUPPLY — 58 items
APPLIER CLIP 11 MED OPEN (CLIP)
APPLIER CLIP 13 LRG OPEN (CLIP)
BAG HAMPER (MISCELLANEOUS) ×2 IMPLANT
BARRIER SKIN 2 3/4 (OSTOMY) ×2 IMPLANT
CLAMP POUCH DRAINAGE QUIET (OSTOMY) IMPLANT
CLIP APPLIE 11 MED OPEN (CLIP) IMPLANT
CLIP APPLIE 13 LRG OPEN (CLIP) IMPLANT
CLOTH BEACON ORANGE TIMEOUT ST (SAFETY) ×2 IMPLANT
COVER LIGHT HANDLE STERIS (MISCELLANEOUS) ×4 IMPLANT
DRAIN PENROSE 12X.25 LTX STRL (MISCELLANEOUS) ×2 IMPLANT
DRAPE WARM FLUID 44X44 (DRAPE) ×2 IMPLANT
ELECT BLADE 6 FLAT ULTRCLN (ELECTRODE) ×2 IMPLANT
ELECT REM PT RETURN 9FT ADLT (ELECTROSURGICAL) ×2
ELECTRODE REM PT RTRN 9FT ADLT (ELECTROSURGICAL) ×1 IMPLANT
GLOVE BIOGEL M 7.0 STRL (GLOVE) ×2 IMPLANT
GLOVE BIOGEL PI IND STRL 7.0 (GLOVE) ×2 IMPLANT
GLOVE BIOGEL PI IND STRL 7.5 (GLOVE) ×1 IMPLANT
GLOVE BIOGEL PI INDICATOR 7.0 (GLOVE) ×2
GLOVE BIOGEL PI INDICATOR 7.5 (GLOVE) ×1
GLOVE ECLIPSE 6.5 STRL STRAW (GLOVE) ×4 IMPLANT
GOWN BRE IMP SLV AUR XL STRL (GOWN DISPOSABLE) ×6 IMPLANT
HARMONIC SHEARS 14CM COAG (MISCELLANEOUS) IMPLANT
KIT COLOSTOMY ILEOSTOMY 4 (WOUND CARE) IMPLANT
KIT ROOM TURNOVER APOR (KITS) ×2 IMPLANT
LIGASURE IMPACT 36 18CM CVD LR (INSTRUMENTS) ×2 IMPLANT
MANIFOLD NEPTUNE II (INSTRUMENTS) ×2 IMPLANT
NS IRRIG 1000ML POUR BTL (IV SOLUTION) ×2 IMPLANT
PACK ABDOMINAL MAJOR (CUSTOM PROCEDURE TRAY) ×2 IMPLANT
PAD ARMBOARD 7.5X6 YLW CONV (MISCELLANEOUS) ×2 IMPLANT
POUCH OSTOMY 2 3/4  H 3804 (WOUND CARE)
POUCH OSTOMY 2 PC DRNBL 2.75 (WOUND CARE) IMPLANT
RELOAD LINEAR CUT PROX 55 BLUE (ENDOMECHANICALS) IMPLANT
RELOAD PROXIMATE 75MM BLUE (ENDOMECHANICALS) ×2 IMPLANT
RETAINER VISCERA MED (MISCELLANEOUS) ×2 IMPLANT
SEPRAFILM MEMBRANE 5X6 (MISCELLANEOUS) ×2 IMPLANT
SET BASIN LINEN APH (SET/KITS/TRAYS/PACK) ×2 IMPLANT
SHEARS HARMONIC 23CM COAG (MISCELLANEOUS) IMPLANT
SOL PREP PROV IODINE SCRUB 4OZ (MISCELLANEOUS) ×2 IMPLANT
SPONGE GAUZE 4X4 12PLY (GAUZE/BANDAGES/DRESSINGS) ×2 IMPLANT
SPONGE LAP 18X18 X RAY DECT (DISPOSABLE) ×2 IMPLANT
STAPLER GUN LINEAR PROX 60 (STAPLE) ×2 IMPLANT
STAPLER PROXIMATE 55 BLUE (STAPLE) IMPLANT
STAPLER PROXIMATE 75MM BLUE (STAPLE) ×2 IMPLANT
STAPLER VISISTAT 35W (STAPLE) ×2 IMPLANT
SUCTION POOLE TIP (SUCTIONS) ×2 IMPLANT
SUT CHROMIC 0 SH (SUTURE) ×4 IMPLANT
SUT ETHIBOND NAB MO 7 #0 18IN (SUTURE) ×2 IMPLANT
SUT MNCRL AB 4-0 PS2 18 (SUTURE) IMPLANT
SUT NOVA NAB GS-26 0 60 (SUTURE) ×4 IMPLANT
SUT SILK 2 0 (SUTURE)
SUT SILK 2 0 REEL (SUTURE) IMPLANT
SUT SILK 2-0 18XBRD TIE 12 (SUTURE) IMPLANT
SUT SILK 3 0 SH CR/8 (SUTURE) ×4 IMPLANT
SUT VIC AB 3-0 SH 27 (SUTURE)
SUT VIC AB 3-0 SH 27X BRD (SUTURE) IMPLANT
SYR BULB IRRIGATION 50ML (SYRINGE) ×2 IMPLANT
TOWEL OR 17X26 4PK STRL BLUE (TOWEL DISPOSABLE) ×2 IMPLANT
TRAY FOLEY CATH 14FR (SET/KITS/TRAYS/PACK) ×2 IMPLANT

## 2010-12-07 NOTE — Progress Notes (Signed)
Subjective: This mancontinues to be n.p.o. and on intravenous fluids. He is awaiting surgery today approximately noon. This man has had several admissions with symptoms of nausea vomiting and we have never found no obstruction until now. He denies any pain or nausea vomiting at the present time. Dr. Leticia Penna will operate on him today.  Objective: Vital signs in last 24 hours: Temp:  [97.3 F (36.3 C)-97.4 F (36.3 C)] 97.4 F (36.3 C) (07/10 0531) Pulse Rate:  [61-67] 67  (07/10 0531) Resp:  [16-17] 17  (07/10 0531) BP: (100)/(67-70) 100/70 mmHg (07/10 0531) SpO2:  [98 %-100 %] 100 % (07/10 0531) Weight change:  Last BM Date: 12/06/10  Intake/Output from previous day: 07/09 0701 - 07/10 0700 In: 950 [P.O.:325; I.V.:625] Out: -  Intake/Output this shift:    Physical exam: He looks systemically what well. He is hemodynamically stable. He is not clinically dehydrated. Heart sounds are present and normal. Lungs are clear. Abdomen is soft and somewhat distended. There is no tenderness. I can hear hyperactive bowel sounds.  Lab Results:  Basename 12/06/10 1605 12/05/10 0427  WBC 3.8* 3.2*  HGB 10.1* 9.5*  HCT 29.5* 27.6*  PLT 69* 61*   BMET  Basename 12/06/10 1605 12/05/10 0427  NA 136 133*  K 3.7 4.0  CL 105 103  CO2 26 26  GLUCOSE 82 76  BUN 16 18  CREATININE 1.21 1.27  CALCIUM 8.3* 8.0*    Studies/Results: No results found.  Medications: I have reviewed the patient's current medications.  Assessment/Plan: 1. Partial small bowel obstruction-continue n.p.o. and intravenous fluids. For surgery today. 2. Stage IV colon cancer. 3. Acute renal failure resolved. Monitor electrolytes. 4. Pancytopenia, monitor.  LOS: 8 days   Jamis Kryder C 12/07/2010, 9:38 AM

## 2010-12-07 NOTE — Anesthesia Preprocedure Evaluation (Addendum)
Anesthesia Evaluation  Name, MR# and DOB Patient awake  General Assessment Comment  Reviewed: Allergy & Precautions, H&P  and Patient's Chart, lab work & pertinent test results  History of Anesthesia Complications Negative for: history of anesthetic complications  Airway Mallampati: II TM Distance: >3 FB Neck ROM: Full    Dental  (+) Missing, Loose, Chipped, Poor Dentition and Dental Advisory Given   Pulmonary  sleep apneaCOPD  clear to auscultation    Cardiovascular hypertension, Pt. on home beta blockers + CAD (Lopressor stopped 3 months ago), + Past MI and + Cardiac Stents Regular Normal  Admitted near syncope and dehydration.   Admitted near syncope and dehydration.  :    Neuro/Psych    occas Etoh   GI/Hepatic/Renal Rectal adencarcinoma with obstruction.  Renal insuff fron dehydration  Endo/Other   (+) Diabetes mellitus-, Well Controlled, Type 2  Abdominal   Musculoskeletal  Hematology   Peds  Reproductive/Obstetrics          Anesthesia Physical Anesthesia Plan  ASA: IV  Anesthesia Plan: General   Post-op Pain Management:    Induction: Rapid sequence, Intravenous and Cricoid pressure planned  Airway Management Planned: Oral ETT  Additional Equipment:   Intra-op Plan:   Post-operative Plan: Extubation in OR  Informed Consent: I have reviewed the patients History and Physical, chart, labs and discussed the procedure including the risks, benefits and alternatives for the proposed anesthesia with the patient or authorized representative who has indicated his/her understanding and acceptance.     Plan Discussed with: CRNA  Anesthesia Plan Comments:         Anesthesia Quick Evaluation

## 2010-12-07 NOTE — Procedures (Signed)
Preoperative diagnoses: Distal bowel obstruction  Postoperative diagnoses: Multiple intra-abdominal effusions distal ileo-colonic anastomoses stricture  Procedure: Exploratory laparotomy with extensive lysis of adhesions, small bowel resection with primary side-to-side anastomoses, diverting loop ileostomy.  Surgeon: [name]  Anesthesia: Gen. endotracheal  Estimated blood loss less than 100 mL's  Fluid: Crystalloid  Specimen: Small bowel segment  Indications:  Patient is a 60 year old male well-known to me with a distant history of a subtotal colectomy for colon cancer. He done extremely well postoperatively overlie several years however the last couple months he's had intermittent episodes of nausea vomiting and abdominal discomfort. Prior workup was suspicious for a stricture at the ileo-rectal anastomoses. This is again confirmed on barium enema. Due to the persistent obstruction and was advised patient had a diverting ileostomy. Respiratory alternatives were discussed at length with the patient. His questions and concerns were dressed the patient was consented for planned procedure.  Operation:  The patient was taken to the operating room he was placed in the supine position on the operating table shunt a general anesthetic was measured. He is endotracheally intubated by anesthesia. A Foley catheter was placed in standard sterile fashion by Dr. Haematologist. His abdomen is then prepped with DuraPrep solution and draped in standard fashion. At this point a infraumbilical midline incision is created with a 10 blade scalpel with continuation of this in a right lateral keyhole defect around the umbilicus. Additional dissection of the subcuticular tissues carried out using electrocautery including division of fascia. Coker clamps are utilized to grasp the fascia but this anteriorly sharp dissection is carried out and turned to the peritoneum with Metzenbaum scissors. At this point multiple  loops of dilated small bowel are encountered in extensive enterolysis is carried out using a combination of electrocautery and sharp Metzenbaum scissor dissection and blunt digital dissection. There is noted to be at particular loop of small intestine which was adherent to the small bowel colonic anastomoses. There is thickening of the tissue around this area. Although recurrence of his adenocarcinoma cannot be confirmed it is somewhat suspicious. I did attempt breast myeloid to excise majority of this area. Sparing the ileo-rectal anastomoses. At this point it enter into the small bowel of the tethered portion of intestine. Bowel clamps were placed both proximally and distally to control spillage. I was able to eventually free this loop and bring it back up into the surgical field. At this point inspect further distally noting that the bowel distal to this area was still dilated. It was obvious that this was not the single transition point. The bowel continue be dilated up to the ileorectal anastomosis. At this point it turns into creation of the small bowel small bowel anastomosis.  Both the proximal and distal ends of the small bowel were brought into approximation in a side-to-side fashion a 3-0 silk was utilized as a tacking suture. Posterior artery was then utilized to create the enterotomy both on both ends of small intestine. A 75 GIA stapler was then utilized to create the stapled anastomoses. The remaining enterotomy and allergies were removed using a TA 60. The distal portion of bowel is removed using curved Mayo scissors. This was sent as a permanent specimen the pathology. At this point palpated the anastomosis was widely patent. A 3-0 silk was placed in the apex of the anastomoses for pexing suture. The seat staple line was imbricated using interrupted 3-0 silk. The mesenteric defect was reapproximated using additional 3 is soaks. At this point the abdomen was  irrigated with copious amounts sterile  saline. In attention now was turned to creation of the diverting ileostomy.  An Allis clamp visualized across the skin the planned site of the ileostomy. The cautery was utilized to create the skin defect and carried down through the subcutaneous tissue to remove a plug of adipose tissue. The fascia was scored in a cruciate fashion with electrocautery blunt digital dilatation was carried out up to 3 fingers. The distal small bowel is identified for which standard brought through the abdominal wall. It was noted that this easily reached the abdominal wall and a defect was created in the mesentery through which a quarter inch Penrose drain was placed. A Penrose drain was utilized to help elevate the bowel up through the abdominal wall. This time attention was turned to closure of the abdomen to a piece of Seprafilm was placed over the bowel and the anterior surface. The fascia was reapproximated using 2 separate oh looped Novafil s in a running continuous fashion. The first was started inferiorly approximately the midportion of the incision a second one started superiorly and was secured to the first. The subcutaneous tissue was irrigated and the skin edges were reapproximated using skin staples.  At this point a sterile towels placed over the staple incision line attention was create turned to maturing the ostomy. The small bowel was entered into using electrocautery to create the enterotomy. 0 chromic suture was then utilized to mature the ileostomy. This is a double barrel loop ileostomy. I was quite pleased with the appearance at this point attention was turned to placement of the ostomy appliance. With this in place a sterile dressing was placed over the midline incision. The skin was washed and dried and was contractile. This trips removed the dressings were secured. The patient was allowed to come out of general anesthetic. He was transferred back to a regular hospital bed and transferred to postanesthetic  care unit in stable condition. At the conclusion of the procedure all instrument sponge and needle counts are correct the patient tolerated procedure additionally well. This ends the operative report on [name]   CC a copy of this report to the patient's primary care physician a copy for Dr. Laurie Panda as well as a copy for my office thank you.

## 2010-12-07 NOTE — Progress Notes (Signed)
  Pt seen and evalutated.  No change.  Will proceed to OR as discussed to perform diverting ileostomy.

## 2010-12-07 NOTE — Anesthesia Procedure Notes (Addendum)
Date/Time: 12/07/2010 1:40 PM Performed by: Minerva Areola Preoxygenation: Pre-oxygenation with 100% oxygen    Procedure Name: Intubation Date/Time: 12/07/2010 1:44 PM Performed by: Minerva Areola Pre-anesthesia Checklist: Patient identified, Patient being monitored, Emergency Drugs available and Suction available Oxygen Delivery Method: Circle System Utilized Intubation Type: IV induction, Rapid sequence and Circoid Pressure applied Laryngoscope Size: Hyacinth Meeker and 2 Laryngoscope size: Yeagle and 2 Grade View: Grade I Tube type: Oral Tube size: 7.0 mm Number of attempts: 1 Airway Equipment and Method: stylet Placement Confirmation: ETT inserted through vocal cords under direct vision,  positive ETCO2 and breath sounds checked- equal and bilateral Secured at: 22 cm Tube secured with: Tape Dental Injury: Teeth and Oropharynx as per pre-operative assessment     Performed by: Glynn Octave

## 2010-12-07 NOTE — Transfer of Care (Signed)
Immediate Anesthesia Transfer of Care Note  Patient: [name]  Procedure(s) Performed:  ILEOSTOMY - Diverting Ileostomy Lysis of adhesions Exploratory Laparotomy  Patient Location: PACU  Anesthesia Type: General  Level of Consciousness: awake and alert   Airway & Oxygen Therapy: Patient Spontanous Breathing and Patient connected to face mask  Post-op Assessment: Report given to PACU RN, Post -op Vital signs reviewed and stable and Patient moving all extremities  Post vital signs: stable  Complications: No apparent anesthesia complications

## 2010-12-07 NOTE — Anesthesia Postprocedure Evaluation (Signed)
  Anesthesia Post-op Note  Patient: [name]  Procedure(s) Performed:  ILEOSTOMY - Diverting Ileostomy Lysis of adhesions Exploratory Laparotomy  Patient Location: PACU  Anesthesia Type: General  Level of Consciousness: oriented  Airway and Oxygen Therapy: Patient connected to nasal cannula oxygen  Post-op Pain: mild  Post-op Assessment: Patient's Cardiovascular Status Stable, Respiratory Function Stable, Patent Airway and No signs of Nausea or vomiting  Post-op Vital Signs: stable  Complications: No apparent anesthesia complications

## 2010-12-08 ENCOUNTER — Ambulatory Visit (HOSPITAL_COMMUNITY): Payer: Managed Care, Other (non HMO) | Admitting: Oncology

## 2010-12-08 LAB — TYPE AND SCREEN
Antibody Screen: NEGATIVE
Unit division: 0

## 2010-12-08 LAB — CBC
MCH: 30.6 pg (ref 26.0–34.0)
MCV: 90.5 fL (ref 78.0–100.0)
Platelets: 71 10*3/uL — ABNORMAL LOW (ref 150–400)
RDW: 19.3 % — ABNORMAL HIGH (ref 11.5–15.5)
WBC: 7.2 10*3/uL (ref 4.0–10.5)

## 2010-12-08 LAB — COMPREHENSIVE METABOLIC PANEL
AST: 30 U/L (ref 0–37)
Albumin: 1.9 g/dL — ABNORMAL LOW (ref 3.5–5.2)
Calcium: 7.9 mg/dL — ABNORMAL LOW (ref 8.4–10.5)
Creatinine, Ser: 1.99 mg/dL — ABNORMAL HIGH (ref 0.50–1.35)
Sodium: 137 mEq/L (ref 135–145)

## 2010-12-08 MED ORDER — ONDANSETRON HCL 4 MG/2ML IJ SOLN
4.0000 mg | Freq: Four times a day (QID) | INTRAMUSCULAR | Status: DC | PRN
  Administered 2010-12-09 – 2010-12-10 (×5): 4 mg via INTRAVENOUS
  Filled 2010-12-08 (×2): qty 2

## 2010-12-08 MED ORDER — ENOXAPARIN SODIUM 40 MG/0.4ML ~~LOC~~ SOLN: 40.0000 mg | SUBCUTANEOUS | Status: DC

## 2010-12-08 MED ORDER — BOOST / RESOURCE BREEZE PO LIQD
1.0000 | Freq: Three times a day (TID) | ORAL | Status: DC
  Administered 2010-12-08 – 2010-12-09 (×2): 1 via ORAL
  Filled 2010-12-08 (×15): qty 1

## 2010-12-08 MED ORDER — SODIUM CHLORIDE 0.9 % IJ SOLN
INTRAMUSCULAR | Status: AC
  Filled 2010-12-08: qty 10

## 2010-12-08 MED ORDER — POTASSIUM CHLORIDE IN NACL 20-0.9 MEQ/L-% IV SOLN
INTRAVENOUS | Status: DC
  Administered 2010-12-08 – 2010-12-11 (×6): via INTRAVENOUS

## 2010-12-08 MED ORDER — LACTATED RINGERS IV SOLN
INTRAVENOUS | Status: DC
  Administered 2010-12-08: 09:00:00 via INTRAVENOUS

## 2010-12-08 MED ORDER — MORPHINE SULFATE 2 MG/ML IJ SOLN
1.0000 mg | INTRAMUSCULAR | Status: DC | PRN
  Administered 2010-12-09 – 2010-12-10 (×5): 2 mg via INTRAVENOUS
  Filled 2010-12-08 (×3): qty 1

## 2010-12-08 NOTE — Progress Notes (Cosign Needed)
UR Chart Review Completed  

## 2010-12-08 NOTE — Progress Notes (Signed)
Subjective: This patient had surgery yesterday and now has  what appears to be a colostomy. He has been able to drink some sips of fluid. He wants to mobilize. He denies any dyspnea or cough. There is no significant abdominal pain.  Objective: Vital signs in last 24 hours: Temp:  [97.1 F (36.2 C)-98.7 F (37.1 C)] 97.8 F (36.6 C) (07/11 0500) Pulse Rate:  [70-126] 70  (07/11 0924) Resp:  [18-20] 20  (07/11 0500) BP: (94-127)/(65-86) 118/84 mmHg (07/11 0500) SpO2:  [96 %-100 %] 98 % (07/11 0924) Weight change:  Last BM Date: 12/07/10  Intake/Output from previous day: 07/10 0701 - 07/11 0700 In: 2800 [I.V.:2800] Out: 1250 [Urine:175; Emesis/NG output:450; Stool:500; Blood:125] Intake/Output this shift: I/O this shift: In: 120 [P.O.:120] Out: -   On physical examination he looks somewhat dehydrated. Heart sounds are present and normal lung fields are clear. Abdomen is soft with laparotomy scar and colostomy bag.  Lab Results:  Basename 12/08/10 0530 12/07/10 2130  WBC 7.2 12.4*  HGB 10.3* 11.8*  HCT 30.5* 34.5*  PLT 71* 95*   BMET  Basename 12/08/10 0530 12/07/10 2130 12/06/10 1605  NA 137 -- 136  K 4.5 -- 3.7  CL 106 -- 105  CO2 25 -- 26  GLUCOSE 151* -- 82  BUN 25* -- 16  CREATININE 1.99* 1.73* --  CALCIUM 7.9* -- 8.3*    Studies/Results: No results found.  Medications: I have reviewed the patient's current medications.  Assessment/Plan: 1. Progressive renal failure-and he is likely hypovolemic and needs increase in fluids. We will change him to normal saline and and increase the rate. 2. Small bowel obstruction status post surgery. 3. Stage IV colon cancer. 4. Thrombocytopenia and anemia, stable.  LOS: 9 days   GOSRANI,NIMISH C 12/08/2010, 11:55 AM

## 2010-12-08 NOTE — Progress Notes (Signed)
Pt states he had stopped taking Metoprolol prior to his current admission.

## 2010-12-08 NOTE — Anesthesia Postprocedure Evaluation (Signed)
  Anesthesia Post-op Note  Patient: [name]  Procedure(s) Performed:  ILEOSTOMY - Diverting Ileostomy Lysis of adhesions Exploratory Laparotomy  Patient Location: PACU  Not in PACU, in a regular room    Anesthesia Type: General  Level of Consciousness: awake, alert  and oriented  Airway and Oxygen Therapy: Patient Spontanous Breathing  Post-op Pain: mild  Post-op Assessment: Patient's Cardiovascular Status Stable, Respiratory Function Stable, Adequate PO intake and Pain level controlled  Post-op Vital Signs: stable  Complications: No apparent anesthesia complications

## 2010-12-08 NOTE — Addendum Note (Signed)
Addendum  created 12/08/10 1525 by Glynn Octave   Modules edited:Notes Section

## 2010-12-08 NOTE — Progress Notes (Signed)
INITIAL ADULT NUTRITION ASSESSMENT Date: 12/08/2010   Time: 2:22 PM Reason for Assessment: poor po intake, severe wt loss  ASSESSMENT: Male 60 y.o.  Dx: Nausea with vomiting  Hx:  Past Medical History  Diagnosis Date  . Cancer     Colon ca dx 2008 surg/rad/chemo  . Pancytopenia, acquired 12/04/2010  . B12 deficiency 12/07/2010   Related Meds: Zofran, Phenergan   Ht: 5\' 10"  (177.8 cm) (added for cutover)  Wt: 81.6 kg (179 lb 14.3 oz) (added for cutover)  Ideal Wt:  73 kg % Ideal Wt: 108%  Usual Wt:225# (Dec. 2011) % Usual Wt: 80%  Body mass index is 25.81 kg/(m^2).  Food/Nutrition Related Hx: Pt and spouse reports poor diet tol with N/V and severe wt loss PTA. Able to feed himself.  Labs:  Results for JAMARIS, THEARD (MRN [id number]) as of 12/08/2010 14:16  Ref. Range 12/05/2010 04:27 12/06/2010 16:05 12/07/2010 12:25 12/07/2010 21:30 12/08/2010 05:30  Sodium Latest Range: 135-145 mEq/L 133 (L) 136   137  Potassium Latest Range: 3.5-5.1 mEq/L 4.0 3.7   4.5  Chloride Latest Range: 96-112 mEq/L 103 105   106  CO2 Latest Range: 19-32 mEq/L 26 26   25   BUN Latest Range: 6-23 mg/dL 18 16   25  (H)  Creat Latest Range: 0.50-1.35 mg/dL 5.62 1.30  8.65 (H) 7.84 (H)  Calcium Latest Range: 8.4-10.5 mg/dL 8.0 (L) 8.3 (L)   7.9 (L)  GFR calc non Af Amer Latest Range: >60 mL/min 58 (L) >60  41 (L) 35 (L)  GFR calc Af Amer Latest Range: >60 mL/min >60 >60  50 (L) 42 (L)  Glucose Latest Range: 70-99 mg/dL 76 82   696 (H)  Glucose-Capillary Latest Range: 70-99 mg/dL   79    Alkaline Phosphatase Latest Range: 39-117 U/L  91   86  Albumin Latest Range: 3.5-5.2 g/dL  2.2 (L)   1.9 (L)  AST Latest Range: 0-37 U/L  19   30  ALT Latest Range: 0-53 U/L  35   27  Total Protein Latest Range: 6.0-8.3 g/dL  5.1 (L)   4.7 (L)  Total Bilirubin Latest Range: 0.3-1.2 mg/dL  1.1   0.6      Intake/Output Summary (Last 24 hours) at 12/08/10 1428 Last data filed at 12/08/10 1400  Gross per 24 hour    Intake 2373.75 ml  Output   1250 ml  Net 1123.75 ml     Diet Order: Clear Liquid  Supplements/Tube Feeding:  IVF:    0.9 % NaCl with KCl 20 mEq / L Last Rate: 150 mL/hr at 12/08/10 1356  DISCONTD: sodium chloride Last Rate: 50 mL/hr at 12/06/10 2201  DISCONTD: lactated ringers Last Rate: 1,000 mL (12/07/10 1228)  DISCONTD: lactated ringers Last Rate: 75 mL/hr at 12/08/10 0925    Estimated Nutritional Needs:   Kcal:2300-2673 kcal Protein:104-120 grams Fluid:2.3-2.7 L/d  NUTRITION DIAGNOSIS: -Inadequate oral intake (NI-2.1).  Status: Ongoing Inadequate oral intake r/t altered GI function AEB Colon CA dx 2008, s/p ileostomy, N/V and 45# wt loss since Dec. 2011.   MONITORING/EVALUATION(Goals):  Pt will tol Low Fiber diet without N/V and consume >75% most meals and oral suppl. Monitor total energy/protein and fluid intake, wt changes, electrolytes, renal profile and I/O's.   INTERVENTION: Pt nutrition needs addressed. Discussed importance of adequate nutrient intake to promote healing and recovery. Will provide Ileostomy Nutr Therapy notes as pt diet is successfully advanced. Rec Low Fiber diet.  While pt is on clear  liquids add Resource Breeze TID with meals and ProStat 30ml TID which will provide 250 Kcal/9g protein each and 15g protein, 72 Kcal each 30 ml.  Dietitian [phone]  DOCUMENTATION CODES Per approved criteria  -Severe malnutrition in the context of chronic illness    Francene Boyers 12/08/2010, 2:22 PM

## 2010-12-08 NOTE — Progress Notes (Signed)
1 Day Post-Op  Subjective: Nausea has improved slightly.  Pain present but controlled.  Feels fairly well.  Tolerating clears.  Objective: Vital signs in last 24 hours: Temp:  [97.1 F (36.2 C)-98.7 F (37.1 C)] 97.8 F (36.6 C) (07/11 0500) Pulse Rate:  [70-126] 70  (07/11 0924) Resp:  [18-20] 20  (07/11 0500) BP: (94-127)/(65-86) 118/84 mmHg (07/11 0500) SpO2:  [96 %-100 %] 98 % (07/11 0924) Last BM Date: 12/07/10  Intake/Output from previous day: 07/10 0701 - 07/11 0700 In: 2800 [I.V.:2800] Out: 1250 [Urine:175; Emesis/NG output:450; Stool:500; Blood:125] Intake/Output this shift: I/O this shift: In: 120 [P.O.:120] Out: -   General appearance: alert, cooperative and no distress Resp: clear to auscultation bilaterally GI: normal findings: bowel sounds normal and soft non distended and abnormal findings:  obese Expected post-op tenderness.  Ileostomy pink and fxn.  Lab Results:  @LABLAST2 (wbc:2,hgb:2,hct:2,plt:2) BMET  Basename 12/08/10 0530 12/07/10 2130 12/06/10 1605  NA 137 -- 136  K 4.5 -- 3.7  CL 106 -- 105  CO2 25 -- 26  GLUCOSE 151* -- 82  BUN 25* -- 16  CREATININE 1.99* 1.73* --  CALCIUM 7.9* -- 8.3*   PT/INR No results found for this basename: LABPROT:2,INR:2 in the last 72 hours ABG No results found for this basename: PHART:2,PCO2:2,PO2:2,HCO3:2 in the last 72 hours  Studies/Results: No results found.  Anti-infectives: Anti-infectives    None      Assessment/Plan: s/p Procedure(s): ILEOSTOMY Continue clears.  Slow advancement of diet.  Await final path. continue pain management  LOS: 9 days    Moxon Messler C 12/08/2010

## 2010-12-09 LAB — COMPREHENSIVE METABOLIC PANEL
ALT: 18 U/L (ref 0–53)
AST: 15 U/L (ref 0–37)
CO2: 24 mEq/L (ref 19–32)
Calcium: 7.4 mg/dL — ABNORMAL LOW (ref 8.4–10.5)
Chloride: 109 mEq/L (ref 96–112)
GFR calc Af Amer: 45 mL/min — ABNORMAL LOW (ref >60)
GFR calc non Af Amer: 37 mL/min — ABNORMAL LOW (ref >60)
Glucose, Bld: 83 mg/dL (ref 70–99)
Sodium: 136 mEq/L (ref 135–145)
Total Bilirubin: 0.6 mg/dL (ref 0.3–1.2)

## 2010-12-09 LAB — CBC
Hemoglobin: 8 g/dL — ABNORMAL LOW (ref 13.0–17.0)
MCH: 31 pg (ref 26.0–34.0)
MCV: 91.5 fL (ref 78.0–100.0)
Platelets: 44 10*3/uL — ABNORMAL LOW (ref 150–400)
RBC: 2.58 MIL/uL — ABNORMAL LOW (ref 4.22–5.81)
WBC: 4 10*3/uL (ref 4.0–10.5)

## 2010-12-09 MED ORDER — SODIUM CHLORIDE 0.9 % IJ SOLN
INTRAMUSCULAR | Status: AC
  Administered 2010-12-09
  Filled 2010-12-09: qty 10

## 2010-12-09 MED ORDER — SODIUM CHLORIDE 0.9 % IJ SOLN
INTRAMUSCULAR | Status: AC
  Administered 2010-12-09: 30 mL via INTRAVENOUS
  Filled 2010-12-09: qty 10

## 2010-12-09 MED ORDER — SODIUM CHLORIDE 0.9 % IJ SOLN
INTRAMUSCULAR | Status: AC
  Administered 2010-12-09: 20 mL
  Filled 2010-12-09: qty 20

## 2010-12-09 NOTE — Progress Notes (Signed)
2 Days Post-Op  Subjective: Pain controlled.  Tol clears.  Ostomy fxn.  No n/v  Objective: Vital signs in last 24 hours: Temp:  [97.6 F (36.4 C)-98.7 F (37.1 C)] 98 F (36.7 C) (07/12 0800) Pulse Rate:  [65-76] 65  (07/12 0800) Resp:  [16-20] 18  (07/12 0800) BP: (94-113)/(55-73) 111/68 mmHg (07/12 0800) SpO2:  [96 %-100 %] 96 % (07/12 1022) Last BM Date: 12/09/10  Intake/Output from previous day: 07/11 0701 - 07/12 0700 In: 2513.8 [P.O.:360; I.V.:2153.8] Out: -  Intake/Output this shift: I/O this shift: In: 120 [P.O.:120] Out: -   General appearance: alert, cooperative and no distress Resp: clear to auscultation bilaterally Cardio: regular rate and rhythm GI: normal findings: bowel sounds normal and soft.  Incision approximated.  Clean/dry/intact and abnormal findings:  expected tenderness.   Incision/Wound:  Lab Results:  @LABLAST2 (wbc:2,hgb:2,hct:2,plt:2) BMET  Basename 12/09/10 0555 12/08/10 0530  NA 136 137  K 4.4 4.5  CL 109 106  CO2 24 25  GLUCOSE 83 151*  BUN 27* 25*  CREATININE 1.88* 1.99*  CALCIUM 7.4* 7.9*   PT/INR No results found for this basename: LABPROT:2,INR:2 in the last 72 hours ABG No results found for this basename: PHART:2,PCO2:2,PO2:2,HCO3:2 in the last 72 hours  Studies/Results: No results found.  Anti-infectives: Anti-infectives    None      Assessment/Plan: s/p Procedure(s): ILEOSTOMY Continue to advance diet slowly.  Ambulate.  Possible d/c next 48-72 hrs pending progression.  LOS: 10 days    Hiro Vipond C 12/09/2010

## 2010-12-09 NOTE — Progress Notes (Signed)
[name], GELPI               ACCOUNT NO.:  [id number]  MEDICAL RECORD NO.:  [id number]  LOCATION:  A325                          FACILITY:  [facility]  PHYSICIAN:  [name], M.D.DATE OF BIRTH:  [birth year]  DATE OF PROCEDURE:  12/09/2010 DATE OF DISCHARGE:                                PROGRESS NOTE   This man appears to be stable and did mobilize yesterday somewhat.  He denies any significant abdominal pain, dyspnea, cough.  PHYSICAL EXAMINATION:  VITAL SIGNS:  Temperature 98.7, blood pressure 113/73, pulse 76, saturation 99%. GENERAL:  He looks systemically well, somewhat pale. CARDIAC:  Heart sounds are present and normal. CHEST:  Lung fields are clear. ABDOMEN:  Soft and bowel sounds are heard.  INVESTIGATIONS:  Hemoglobin 8.0, white blood cell count 4.0, platelets 44.  Sodium 136, potassium 4.4, bicarbonate 24, BUN 27, but creatinine somewhat decreased to 1.88.  IMPRESSION: 1. Renal failure, improving. 2. Small bowel obstruction status post ileostomy. 3. Stage IV colon cancer. 4. Thrombocytopenia and progressive anemia.  PLAN: 1. I will reduce the intravenous fluids slightly to 125 mL/hour as his     renal function is improving. 2. Consider blood transfusion if hemoglobin remains at this level of 8     or so.     [name], M.D.     NCG/MEDQ  D:  12/09/2010  T:  12/09/2010  Job:  [id number]

## 2010-12-10 DIAGNOSIS — K56609 Unspecified intestinal obstruction, unspecified as to partial versus complete obstruction: Secondary | ICD-10-CM | POA: Diagnosis present

## 2010-12-10 LAB — CBC
HCT: 29 % — ABNORMAL LOW (ref 39.0–52.0)
MCH: 29.8 pg (ref 26.0–34.0)
MCHC: 31.3 g/dL (ref 30.0–36.0)
MCHC: 34.8 g/dL (ref 30.0–36.0)
MCV: 88.7 fL (ref 78.0–100.0)
Platelets: 45 10*3/uL — ABNORMAL LOW (ref 150–400)
RDW: 18.7 % — ABNORMAL HIGH (ref 11.5–15.5)
WBC: 5.3 10*3/uL (ref 4.0–10.5)

## 2010-12-10 LAB — BASIC METABOLIC PANEL
Calcium: 7.4 mg/dL — ABNORMAL LOW (ref 8.4–10.5)
GFR calc Af Amer: 52 mL/min — ABNORMAL LOW (ref >60)
GFR calc non Af Amer: 43 mL/min — ABNORMAL LOW (ref >60)
Sodium: 138 mEq/L (ref 135–145)

## 2010-12-10 MED ORDER — PROSOURCE NO CARB PO LIQD
30.0000 mL | Freq: Three times a day (TID) | ORAL | Status: DC
  Administered 2010-12-10 – 2010-12-13 (×4): 30 mL via ORAL
  Filled 2010-12-10 (×12): qty 30

## 2010-12-10 MED ORDER — ENSURE PUDDING PO PUDG: 1.0000 | Freq: Three times a day (TID) | ORAL | Status: DC

## 2010-12-10 MED ORDER — ENSURE IMMUNE HEALTH PO LIQD
237.0000 mL | Freq: Three times a day (TID) | ORAL | Status: DC
  Administered 2010-12-10: 14:00:00 via ORAL
  Administered 2010-12-13: 237 mL via ORAL

## 2010-12-10 MED ORDER — SODIUM CHLORIDE 0.9 % IJ SOLN
INTRAMUSCULAR | Status: AC
  Administered 2010-12-10: 10 mL
  Filled 2010-12-10: qty 10

## 2010-12-10 MED ORDER — SODIUM CHLORIDE 0.9 % IJ SOLN
INTRAMUSCULAR | Status: AC
  Administered 2010-12-10: 09:00:00
  Filled 2010-12-10: qty 10

## 2010-12-10 MED ORDER — ENSURE PO LIQD
237.0000 mL | Freq: Three times a day (TID) | ORAL | Status: DC
Start: 2010-12-10 — End: 2010-12-10

## 2010-12-10 MED ORDER — SODIUM CHLORIDE 0.9 % IJ SOLN
INTRAMUSCULAR | Status: AC
  Filled 2010-12-10: qty 10

## 2010-12-10 NOTE — Progress Notes (Signed)
Subjective: This man is progressing slowly. He gets short of breath and tired after he walks in the hallway. Denies any cough no fever. He has some abdominal pain which is to be expected. He is able to tolerate the current diet without any nausea or vomiting.       Intake/Output from previous day: 07/12 0701 - 07/13 0700 In: 3280.5 [P.O.:360; I.V.:2920.5] Out: 1350 [Urine:550; Stool:800]   Physical Exam: The vital signs UJW:[id number]:  [97.6 F (36.4 C)-97.9 F (36.6 C)] 97.8 F (36.6 C) (07/13 0500) Pulse Rate:  [62-83] 83  (07/13 0842) Resp:  [18-24] 18  (07/13 0500) BP: (100-114)/(65-81) 107/72 mmHg (07/13 0500) SpO2:  [96 %-100 %] 96 % (07/13 0842) He continues to look pale but otherwise stable. Heart sounds are present and normal without murmurs. Lungs fields clear. There is no bronchial breathing. Abdomen is soft and slightly tender. Bowel sounds are heard.   Lab Results:  Basename 12/10/10 0531 12/09/10 0555  WBC 3.3* 4.0  HGB 7.2* 8.0*  HCT 23.0* 23.6*  PLT 45* 44*    Basename 12/10/10 0531 12/09/10 0555  NA 138 136  K 4.4 4.4  CL 112 109  CO2 23 24  GLUCOSE 82 83  BUN 22 27*  CREATININE 1.66* 1.88*  CALCIUM 7.4* 7.4*    Studies/Results: No results found.  Medications: I have reviewed the patient's current medications.  Impression:  Active Problems:  Adenocarcinoma of rectum  Pancytopenia, acquired  Dehydration  ARF (acute renal failure)  Small bowel obstruction     Plan: 1. Transfuse 2 units of blood. 2. Reduce intravenous fluids as renal function is improving. 3. Continue postoperative care per surgery.     LOS: 11 days   GOSRANI,NIMISH C 12/10/2010, 9:36 AM

## 2010-12-10 NOTE — Progress Notes (Signed)
12/10/10 1500  Colostomy RUQ  Placement Date: 12/07/10   Location: RUQ  Ostomy Pouch 2 piece  Stoma Assessment Red  Peristomal Assessment Intact  Treatment Pouch change  Output (mL) 50 mL   WOC initial consult for Ileostomy Demonstrated pouch change to patient and wife at bedside.  Wife able to assist with cutting and application of pouch.  Pt states he prefers to use one piece pouch and clamp after leaving hospital.  Placed on [facility] discharge program for requested pouch samples.  Stoma red and viable, above skin level, 13/4 inches with small amt brown drainage.  Discussed ordering pouching supplies and pouching routines.  Pt plans to have home health ast after discharge.

## 2010-12-10 NOTE — Progress Notes (Signed)
3 Days Post-Op  Subjective: Pain controlled.  No nausea.  Tolerating fulls.  + ostomy out pt  Objective: Vital signs in last 24 hours: Temp:  [97.6 F (36.4 C)-97.9 F (36.6 C)] 97.8 F (36.6 C) (07/13 0500) Pulse Rate:  [62-70] 62  (07/13 0500) Resp:  [18-24] 18  (07/13 0500) BP: (100-114)/(65-81) 107/72 mmHg (07/13 0500) SpO2:  [96 %-100 %] 97 % (07/13 0500) Last BM Date: 12/09/10  Intake/Output from previous day: 07/12 0701 - 07/13 0700 In: 3280.5 [P.O.:360; I.V.:2920.5] Out: 1350 [Urine:550; Stool:800] Intake/Output this shift:    General appearance: alert, cooperative and no distress GI: normal findings: bowel sounds normal and soft with expected tenderness and abnormal findings:  mild LUQ tenderness Incision/Wound:  Lab Results:  @LABLAST2 (wbc:2,hgb:2,hct:2,plt:2) BMET  Basename 12/10/10 0531 12/09/10 0555  NA 138 136  K 4.4 4.4  CL 112 109  CO2 23 24  GLUCOSE 82 83  BUN 22 27*  CREATININE 1.66* 1.88*  CALCIUM 7.4* 7.4*   PT/INR No results found for this basename: LABPROT:2,INR:2 in the last 72 hours ABG No results found for this basename: PHART:2,PCO2:2,PO2:2,HCO3:2 in the last 72 hours  Studies/Results: No results found.  Anti-infectives: Anti-infectives    None      Assessment/Plan: s/p Procedure(s): ILEOSTOMY Advance slowly to soft diet.  Anemia:  transfuse 2 units.  If remains stable, possible d/c over weekend.  Dr. Lovell Sheehan is covering.  LOS: 11 days    [name] C 12/10/2010

## 2010-12-10 NOTE — Progress Notes (Signed)
FOLLOW-UP ADULT NUTRITION ASSESSMENT Date: 12/10/2010   Time: 4:57 PM Reason for Assessment: po intake  ASSESSMENT: Male 60 y.o.  Dx: Nausea with vomiting   Related Meds: Reviewed  Ht:   Wt: 81.6 kg (179 lb 14.3 oz) (added for cutover)  Ideal Wt: 68.5 kg 73 kg    Body mass index is 25.81 kg/(m^2).    Labs:Reviewed   I/O last 3 completed shifts: In: 5080.5 [P.O.:360; I.V.:4720.5] Out: 1350 [Urine:550; Stool:800]  Diet Order: Dysphagia-3 thin liquads. Poor tol of meals reported by pt today. Doesn't "feel well". He also didn't care for the taste of Breeze.  Supplements: Resource Breeze TID with meals and Prostat 30 ml TID  IVF:    0.9 % NaCl with KCl 20 mEq / L Last Rate: 125 mL/hr at 12/10/10 0213    Estimated Nutritional Needs:   Kcal:2300-2673 kcal Protein:104-120 grams Fluid:2.3-2.7 L/d  NUTRITION DIAGNOSIS: -Inadequate oral intake (NI-2.1).  Status: Ongoing  MONITORING/EVALUATION(Goals): Goal not met.  INTERVENTION: D/c Breeze pt doesn't like flavor. Ensure added TID. Cont Prostat 30 ml TID  Dietitian #[id number]  DOCUMENTATION CODES Per approved criteria      Francene Boyers 12/10/2010, 4:57 PM

## 2010-12-11 LAB — BASIC METABOLIC PANEL
CO2: 23 mEq/L (ref 19–32)
Calcium: 7.6 mg/dL — ABNORMAL LOW (ref 8.4–10.5)
Creatinine, Ser: 1.41 mg/dL — ABNORMAL HIGH (ref 0.50–1.35)
GFR calc non Af Amer: 52 mL/min — ABNORMAL LOW (ref >60)

## 2010-12-11 LAB — CBC
MCH: 30.5 pg (ref 26.0–34.0)
MCHC: 34.2 g/dL (ref 30.0–36.0)
MCV: 89.3 fL (ref 78.0–100.0)
Platelets: 43 10*3/uL — ABNORMAL LOW (ref 150–400)
RBC: 3.08 MIL/uL — ABNORMAL LOW (ref 4.22–5.81)
RDW: 19 % — ABNORMAL HIGH (ref 11.5–15.5)

## 2010-12-11 LAB — TYPE AND SCREEN
ABO/RH(D): A POS
Antibody Screen: NEGATIVE
Unit division: 0

## 2010-12-11 NOTE — Plan of Care (Signed)
Problem: Phase II Progression Outcomes Goal: IV changed to normal saline lock Outcome: Progressing IVF to [facility] today  Problem: Phase III Progression Outcomes Goal: Activity at appropriate level-compared to baseline (UP IN CHAIR FOR HEMODIALYSIS)  Ambulates in hallways with minimal assistance for safety

## 2010-12-11 NOTE — Progress Notes (Signed)
Subjective:  This man had 2 units of blood yesterday. He feels somewhat stronger today. He has no nausea or vomiting and is tolerating a dysphagia 3 diet. The ileostomy is working well. He has not been mobilizing as well as I had anticipated. He denies any shortness of breath or cough.       Intake/Output from previous day: 07/13 0701 - 07/14 0700 In: 3599 [I.V.:2999; Blood:600] Out: 2003 [Urine:853; Stool:1150]   Physical Exam: The vital signs ZOX:WRUE:  [97.3 F (36.3 C)-99 F (37.2 C)] 97.4 F (36.3 C) (07/14 0500) Pulse Rate:  [55-93] 93  (07/14 0914) Resp:  [14-20] 19  (07/14 0914) BP: (104-129)/(70-81) 127/81 mmHg (07/14 0500) SpO2:  [98 %-100 %] 98 % (07/14 0914) He looks better today. Lung fields are clear. Abdomen is soft and mildly tender. Bowel sounds are heard.   Lab Results:  Basename 12/11/10 0423 12/10/10 2001  WBC 4.0 5.3  HGB 9.4* 10.1*  HCT 27.5* 29.0*  PLT 43* 45*    Basename 12/11/10 0423 12/10/10 0531  NA 136 138  K 4.4 4.4  CL 109 112  CO2 23 23  GLUCOSE 81 82  BUN 21 22  CREATININE 1.41* 1.66*  CALCIUM 7.6* 7.4*   No results found for this or any previous visit (from the past 240 hour(s)).  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 Sagittal and coronal MPR images reconstructed from axial data set.  IV contrast not utilized due to renal dysfunction. Dilute oral contrast administered  Comparison: CT images from PET CT 10/13/2010  Findings: Tiny right lung base nodule image 10 stable. Coronary arterial calcifications. Small hiatal  hernia. Bilateral renal cysts and small renal calculi, largest calculus 7 mm diameter lower pole left kidney. Within limits of a nonenhanced exam, no additional focal abnormalities of the liver, spleen, pancreas, kidneys, or adrenal glands. Remainder of stomach unremarkable.  Sub total colectomy with ileocolic anastomoses in pelvis. Rectum is decompressed but small bowel loops are diffusely dilated compatible with small bowel obstruction at the anastomoses. Soft tissue thickening is seen at this site; FDG accumulation was seen at this site on recent PET CT highly suspicious for residual or recurrent tumor. No definite bowel wall thickening, pneumatosis, free fluid, or free air.  No additional intra abdominal mass or adenopathy. Stable sclerotic focus in left ilium unchanged. Bones appear demineralized with mild scattered degenerative disc and facet disease changes of the thoracolumbar spine as well as chronic height loss anteriorly at the T12 vertebral body.  IMPRESSION: Bilateral renal cysts and calculi. Small bowel obstruction secondary to obstruction at the thickened ileocolic anastomoses, with prior PET CT examination showing FDG accumulation at this site highly suspicious for residual or recurrent tumor. Interval cholecystectomy with small residual fluid and gas collection at gallbladder fossa.  Original Report Authenticated By: Lollie Marrow, M.D.   Ct Head Wo Contrast  11/30/2010  *RADIOLOGY REPORT*  Clinical Data: Syncope, diabetes, history rectal carcinoma  CT HEAD WITHOUT CONTRAST  Technique:  Contiguous axial images were obtained from the base of the skull through the vertex without contrast.  Comparison: None  Findings: Normal ventricular morphology. No midline shift or mass  effect. Normal appearance of brain parenchyma. No intracranial hemorrhage, mass lesion, or acute infarction. Visualized paranasal sinuses and mastoid air cells clear. Bones unremarkable.  IMPRESSION: No acute intracranial  abnormalities. It should be noted that a noncontrast CT exam is an insensitive modality for detection of small intracranial metastases. If CNS metastatic disease is weekly suspected, recommend MR imaging of the brain with and without contrast.  Original Report Authenticated By: Lollie Marrow, M.D.   Dg Chest Portable 1 View  11/29/2010  *RADIOLOGY REPORT*  Clinical Data: Weakness  PORTABLE CHEST - 1 VIEW  Comparison: 10/27/2010  Findings: 1.1 cm spiculated right upper lobe lung nodule, corresponding to biopsy proven metastatic adenocarcinoma.  Lungs are otherwise clear. No pleural effusion or pneumothorax.  Cardiomediastinal silhouette is within normal limits.  Surgical clips overlying the right upper abdomen.  Degenerative changes of the visualized thoracolumbar spine.  IMPRESSION: Stable right upper lobe lung nodule, corresponding to biopsy proven metastatic adenocarcinoma.  Otherwise, no acute cardiopulmonary abnormality.  Original Report Authenticated By: Charline Bills, M.D.   Dg Abd 2 Views  12/04/2010  *RADIOLOGY REPORT*  Clinical Data: Vomiting, evaluate for obstruction  ABDOMEN - 2 VIEW  Comparison: CT dated 11/30/2010  Findings: The patient is status post subtotal colectomy with ileocolonic anastomosis.  Again seen are dilated loops of small bowel with air-fluid levels on the upright view, suspicious for small bowel obstruction.  No evidence of free air under the diaphragm on the upright view.  Surgical clips in the right upper abdomen.  Central venous catheter with its tip in the right mediastinum, incompletely visualized.  IMPRESSION: Dilated loops of small bowel, suspicious for small bowel obstruction, unchanged from recent CT.  Original Report Authenticated By: Charline Bills, M.D.   Dg Colon W/cm - Wo/w Kub  12/06/2010  *RADIOLOGY REPORT*  Clinical Data: Carcinoma of the colon post sub total colectomy with ileocolic anastomoses, an anastomotic obstruction and bowel dilatation  WATER SOLUBLE  ENEMA:  Technique:  After insertion of an enema tip, a diagnostic enema was performed utilizing water soluble contrast material.  Contrast was administered retrograde into the colon by gravity.  Multiple images were obtained.  Total fluoroscopy time: 1.5 minutes  Comparison: Abdominal radiograph 12/04/2010  Findings: Marked dilatation of small bowel loops compatible with obstruction. Loops measure up to 10 cm in transverse diameter. No rectal gas identified. After insertion of an enema tip, water-soluble contrast material (Omnipaque-300) dilute with a small amount of water was instilled retrograde.  Marked narrowing of bowel wall at ileocolic anastomosis, with significant distention of small bowel to this site. Area of narrowing is irregular and recurrent tumor cannot be excluded radiographically. More proximally the small bowel is significantly distended without mass or filling defect. No bowel wall thickening or contrast extravasation. Procedure tolerated well by patient.  IMPRESSION: High-grade obstruction at the ileocolic anastomosis with marked distention of small bowel loops proximal to anastomoses. The area of stricture is irregular, which could be result of edema or wall thickening but recurrent tumor is not excluded with this appearance.  Original Report Authenticated By: Lollie Marrow, M.D.    Medications: I have reviewed the patient's current medications.  Impression:  Active Problems:  Adenocarcinoma of rectum  Pancytopenia, acquired  Dehydration  ARF (acute renal failure)  Small bowel obstruction     Plan: 1. KVO IV fluids. 2. Regular diet. 3. Continue Lovenox. 4. Possible discharge tomorrow.     LOS: 12 days   Donaldo Teegarden C 12/11/2010, 9:25 AM

## 2010-12-11 NOTE — Progress Notes (Signed)
4 Days Post-Op  Subjective: Patient feels better. Mild left-sided abdominal pain probably secondary to medication the subcutaneous injections on left side of abdomen. Tolerating by mouth well.  Objective: Vital signs in last 24 hours: Temp:  [97.3 F (36.3 C)-99 F (37.2 C)] 97.4 F (36.3 C) (07/14 0500) Pulse Rate:  [55-93] 93  (07/14 0914) Resp:  [14-20] 19  (07/14 0914) BP: (104-129)/(70-81) 127/81 mmHg (07/14 0500) SpO2:  [98 %-100 %] 98 % (07/14 0914) Last BM Date: 12/11/10  Intake/Output from previous day: 07/13 0701 - 07/14 0700 In: 3599 [I.V.:2999; Blood:600] Out: 2003 [Urine:853; Stool:1150] Intake/Output this shift: I/O this shift: In: -  Out: 100 [Stool:100]  Abdomen: Soft, slightly tender over ecchymotic area on left side of abdomen. Ostomy is pink and patent. Incision is healing well without drainage or erythema.  Lab Results:   Basename 12/11/10 0423 12/10/10 2001  WBC 4.0 5.3  HGB 9.4* 10.1*  HCT 27.5* 29.0*  PLT 43* 45*   BMET  Basename 12/11/10 0423 12/10/10 0531  NA 136 138  K 4.4 4.4  CL 109 112  CO2 23 23  GLUCOSE 81 82  BUN 21 22  CREATININE 1.41* 1.66*  CALCIUM 7.6* 7.4*   Anti-infectives: Anti-infectives    None      Assessment/Plan: s/p Procedure(s): ILEOSTOMY Overall, the patient is improving. His hemoglobin is stable after 2 units of packed red blood cells. I would KVO his maintenance fluids. Regular diet is recommended. I would DC Lovenox due to the thrombocytopenia.  LOS: 12 days    Vanity Larsson A 12/11/2010

## 2010-12-12 LAB — CBC
MCH: 30.5 pg (ref 26.0–34.0)
MCHC: 33.9 g/dL (ref 30.0–36.0)
MCV: 89.7 fL (ref 78.0–100.0)
Platelets: 46 10*3/uL — ABNORMAL LOW (ref 150–400)
RBC: 3.02 MIL/uL — ABNORMAL LOW (ref 4.22–5.81)
RDW: 18.4 % — ABNORMAL HIGH (ref 11.5–15.5)

## 2010-12-12 LAB — BASIC METABOLIC PANEL
CO2: 22 mEq/L (ref 19–32)
Calcium: 7.5 mg/dL — ABNORMAL LOW (ref 8.4–10.5)
Creatinine, Ser: 1.16 mg/dL (ref 0.50–1.35)

## 2010-12-12 MED ORDER — SODIUM CHLORIDE 0.9 % IJ SOLN
INTRAMUSCULAR | Status: AC
  Filled 2010-12-12: qty 10

## 2010-12-12 NOTE — Progress Notes (Signed)
Subjective: This man continues to progress and mobilize slowly. He tells me that Dr. Lovell Sheehan as planned for him to be discharged tomorrow. There is no nausea or vomiting and he is tolerating a diet.           Physical Exam: The vital signs FBP:ZWCH:  [97.9 F (36.6 C)-98 F (36.7 C)] 98 F (36.7 C) (07/15 0600) Pulse Rate:  [55-92] 55  (07/15 0600) Resp:  [18-20] 20  (07/15 0600) BP: (114-128)/(66-80) 114/66 mmHg (07/15 0600) SpO2:  [96 %-98 %] 98 % (07/15 0737) He looks systemically well. Heart sounds are present and normal without murmurs lung fields are clear, abdomen is soft and now not particularly tender. Bowel sounds are heard.   Investigations: Results for orders placed during the hospital encounter of 11/29/10 (from the past 24 hour(s))  CBC     Status: Abnormal   Collection Time   12/12/10  4:40 AM      Component Value Range   WBC 3.3 (*) 4.0 - 10.5 (K/uL)   RBC 3.02 (*) 4.22 - 5.81 (MIL/uL)   Hemoglobin 9.2 (*) 13.0 - 17.0 (g/dL)   HCT 85.2 (*) 77.8 - 52.0 (%)   MCV 89.7  78.0 - 100.0 (fL)   MCH 30.5  26.0 - 34.0 (pg)   MCHC 33.9  30.0 - 36.0 (g/dL)   RDW 24.2 (*) 35.3 - 15.5 (%)   Platelets 46 (*) 150 - 400 (K/uL)  BASIC METABOLIC PANEL     Status: Abnormal   Collection Time   12/12/10  4:40 AM      Component Value Range   Sodium 134 (*) 135 - 145 (mEq/L)   Potassium 4.4  3.5 - 5.1 (mEq/L)   Chloride 110  96 - 112 (mEq/L)   CO2 22  19 - 32 (mEq/L)   Glucose, Bld 71  70 - 99 (mg/dL)   BUN 18  6 - 23 (mg/dL)   Creatinine, Ser 6.14  0.50 - 1.35 (mg/dL)   Calcium 7.5 (*) 8.4 - 10.5 (mg/dL)   GFR calc non Af Amer >60  >60 (mL/min)   GFR calc Af Amer >60  >60 (mL/min)   No results found for this or any previous visit (from the past 240 hour(s)).     Medications: I have reviewed the patient's current medications.  Impression: 1. Small bowel obstruction status post surgery with functioning ileostomy. 2. Stage IV adenocarcinoma of the colon. No longer a  candidate for further chemotherapy. 3. Pancytopenia, probably related to chemotherapy. 4. Acute renal failure, resolved.      Plan: 1. Continue with mobilization. 2. Probable discharge tomorrow.     LOS: 13 days   GOSRANI,NIMISH C 12/12/2010, 9:53 AM

## 2010-12-12 NOTE — Plan of Care (Signed)
Problem: Phase III Progression Outcomes Goal: Activity at appropriate level-compared to baseline (UP IN CHAIR FOR HEMODIALYSIS)  Outcome: Progressing Ambulating in hallway today

## 2010-12-12 NOTE — Progress Notes (Signed)
5 Days Post-Op  Subjective: **Patient feels better today. He is ambulating somewhat. He denies any significant incisional pain.*  Objective: Vital signs in last 24 hours: Temp:  [97.9 F (36.6 C)-98 F (36.7 C)] 98 F (36.7 C) (07/15 0600) Pulse Rate:  [55-93] 55  (07/15 0600) Resp:  [18-20] 20  (07/15 0600) BP: (114-128)/(66-80) 114/66 mmHg (07/15 0600) SpO2:  [96 %-98 %] 98 % (07/15 0600) Last BM Date: 12/12/10 (colostomy)  Intake/Output from previous day: 07/14 0701 - 07/15 0700 In: 1686.7 [P.O.:1240; I.V.:446.7] Out: 2325 [Urine:1250; Stool:1075] Intake/Output this shift: I/O this shift: In: -  Out: 200 [Urine:100; Stool:100]  Abdomen: Soft. Incision healing well. Colostomy pink and patent.  Lab Results:   Basename 12/12/10 0440 12/11/10 0423  WBC 3.3* 4.0  HGB 9.2* 9.4*  HCT 27.1* 27.5*  PLT 46* 43*   BMET  Basename 12/12/10 0440 12/11/10 0423  NA 134* 136  K 4.4 4.4  CL 110 109  CO2 22 23  GLUCOSE 71 81  BUN 18 21  CREATININE 1.16 1.41*  CALCIUM 7.5* 7.6*    Anti-infectives: Anti-infectives    None      Assessment/Plan: s/p Procedure(s): ILEOSTOMY The patient's thrombocytopenia is stable. He was to undergo stomal therapy instruction by the enterostomal therapy nurse tomorrow. Once he feels comfortable with the care of the ostomy, he may be discharged. Anticipate discharge in the next 24-48 hours.  LOS: 13 days    Lorianne Malbrough A 12/12/2010

## 2010-12-13 LAB — BASIC METABOLIC PANEL
CO2: 24 mEq/L (ref 19–32)
Calcium: 7.7 mg/dL — ABNORMAL LOW (ref 8.4–10.5)
Chloride: 107 mEq/L (ref 96–112)
Creatinine, Ser: 1.01 mg/dL (ref 0.50–1.35)
GFR calc Af Amer: >60 mL/min (ref >60)
Sodium: 134 mEq/L — ABNORMAL LOW (ref 135–145)

## 2010-12-13 LAB — CBC
MCH: 30.4 pg (ref 26.0–34.0)
MCV: 90.1 fL (ref 78.0–100.0)
Platelets: 50 10*3/uL — ABNORMAL LOW (ref 150–400)
RDW: 18.3 % — ABNORMAL HIGH (ref 11.5–15.5)
WBC: 3.8 10*3/uL — ABNORMAL LOW (ref 4.0–10.5)

## 2010-12-13 MED ORDER — ENSURE IMMUNE HEALTH PO LIQD: 237.0000 mL | Freq: Three times a day (TID) | ORAL | Status: DC

## 2010-12-13 MED ORDER — SODIUM CHLORIDE 0.9 % IJ SOLN
INTRAMUSCULAR | Status: AC
  Administered 2010-12-13: 10 mL via INTRAVENOUS
  Filled 2010-12-13: qty 10

## 2010-12-13 MED ORDER — HYDROCODONE-ACETAMINOPHEN 5-325 MG PO TABS: 1.0000 | ORAL_TABLET | ORAL | Status: DC | PRN

## 2010-12-13 MED ORDER — PROSOURCE NO CARB PO LIQD: 30.0000 mL | Freq: Three times a day (TID) | ORAL | Status: DC

## 2010-12-13 NOTE — Discharge Summary (Signed)
Physician Discharge Summary  [name] ID: [name] MRN: [id number] DOB/AGE: 58-Jan-1952 58 y.o. Primary Care Physician:[name],[name], MD Admit date: 11/29/2010 Discharge date: 12/13/2010    Discharge Diagnoses:  1. Small bowel obstruction status post surgery resulting in ileostomy. 2. Stage IV colon cancer. Not a candidate for further chemotherapy per oncology. 3. Pancytopenia. 4. Acute renal failure, resolved.    Current Discharge Medication List    START taking these medications   Details  !! feeding supplement (ENSURE IMMUNE HEALTH) LIQD Take 237 mLs by mouth 3 (three) times daily with meals. Qty: 30 Bottle, Refills: 0    HYDROcodone-acetaminophen (NORCO) 5-325 MG per tablet Take 1-2 tablets by mouth every 4 (four) hours as needed. Qty: 30 tablet, Refills: 0    !! protein supplement (PROSOURCE NO CARB) LIQD Take 30 mLs by mouth 3 (three) times daily with meals. Qty: 90 Package, Refills: 0     !! - Potential duplicate medications found. Please discuss with provider.    CONTINUE these medications which have NOT CHANGED   Details  cyanocobalamin (,VITAMIN B-12,) 1000 MCG/ML injection Inject 1,000 mcg into the muscle once.      metoprolol (LOPRESSOR) 50 MG tablet Take 50 mg by mouth daily.     ranitidine (ZANTAC) 150 MG tablet Take 150 mg by mouth 2 (two) times daily.      lisinopril (PRINIVIL,ZESTRIL) 10 MG tablet Take 10 mg by mouth daily.      simvastatin (ZOCOR) 40 MG tablet Take 40 mg by mouth at bedtime. TAKES A HALF A TABLET DAILY       STOP taking these medications     aspirin 81 MG tablet         Discharged Condition: Stable and improved.    Consults: Surgery [name]. Procedures: [name] Physician Signed  Procedures    [name] Physician Signed  Procedures 12/07/2010 3:50 PM     Preoperative diagnoses: Distal bowel obstruction  Postoperative diagnoses: Multiple intra-abdominal effusions distal ileo-colonic  anastomoses stricture  Procedure: Exploratory laparotomy with extensive lysis of adhesions, small bowel resection with primary side-to-side anastomoses, diverting loop ileostomy.  Surgeon: [name]  Anesthesia: Gen. endotracheal  Estimated blood loss less than 100 mL's  Fluid: Crystalloid  Specimen: Small bowel segment  Indications:  [name] is a 59 year old male well-known to me with a distant history of a subtotal colectomy for colon cancer. He done extremely well postoperatively overlie several years however the last couple months he's had intermittent episodes of nausea vomiting and abdominal discomfort. Prior workup was suspicious for a stricture at the ileo-rectal anastomoses. This is again confirmed on barium enema. Due to the persistent obstruction and was advised [name] had a diverting ileostomy. Respiratory alternatives were discussed at length with the [name]. His questions and concerns were dressed the [name] was consented for planned procedure.  Operation:  The [name] was taken to the operating room he was placed in the supine position on the operating table shunt a general anesthetic was measured. He is endotracheally intubated by anesthesia. A Foley catheter was placed in standard sterile fashion by Dr. Haematologist. His abdomen is then prepped with DuraPrep solution and draped in standard fashion. At this point a infraumbilical midline incision is created with a 10 blade scalpel with continuation of this in a right lateral keyhole defect around the umbilicus. Additional dissection of the subcuticular tissues carried out using electrocautery including division of fascia. Coker clamps are utilized to grasp the fascia but this  anteriorly sharp dissection is carried out and turned to the peritoneum with Metzenbaum scissors. At this point multiple loops of dilated small bowel are encountered in extensive enterolysis is carried out using a combination of electrocautery and sharp Metzenbaum  scissor dissection and blunt digital dissection. There is noted to be at particular loop of small intestine which was adherent to the small bowel colonic anastomoses. There is thickening of the tissue around this area. Although recurrence of his adenocarcinoma cannot be confirmed it is somewhat suspicious. I did attempt breast myeloid to excise majority of this area. Sparing the ileo-rectal anastomoses. At this point it enter into the small bowel of the tethered portion of intestine. Bowel clamps were placed both proximally and distally to control spillage. I was able to eventually free this loop and bring it back up into the surgical field. At this point inspect further distally noting that the bowel distal to this area was still dilated. It was obvious that this was not the single transition point. The bowel continue be dilated up to the ileorectal anastomosis. At this point it turns into creation of the small bowel small bowel anastomosis.  Both the proximal and distal ends of the small bowel were brought into approximation in a side-to-side fashion a 3-0 silk was utilized as a tacking suture. Posterior artery was then utilized to create the enterotomy both on both ends of small intestine. A 75 GIA stapler was then utilized to create the stapled anastomoses. The remaining enterotomy and allergies were removed using a TA 60. The distal portion of bowel is removed using curved Mayo scissors. This was sent as a permanent specimen the pathology. At this point palpated the anastomosis was widely patent. A 3-0 silk was placed in the apex of the anastomoses for pexing suture. The seat staple line was imbricated using interrupted 3-0 silk. The mesenteric defect was reapproximated using additional 3 is soaks. At this point the abdomen was irrigated with copious amounts sterile saline. In attention now was turned to creation of the diverting ileostomy.  An Allis clamp visualized across the skin the planned site of the  ileostomy. The cautery was utilized to create the skin defect and carried down through the subcutaneous tissue to remove a plug of adipose tissue. The fascia was scored in a cruciate fashion with electrocautery blunt digital dilatation was carried out up to 3 fingers. The distal small bowel is identified for which standard brought through the abdominal wall. It was noted that this easily reached the abdominal wall and a defect was created in the mesentery through which a quarter inch Penrose drain was placed. A Penrose drain was utilized to help elevate the bowel up through the abdominal wall. This time attention was turned to closure of the abdomen to a piece of Seprafilm was placed over the bowel and the anterior surface. The fascia was reapproximated using 2 separate oh looped Novafil s in a running continuous fashion. The first was started inferiorly approximately the midportion of the incision a second one started superiorly and was secured to the first. The subcutaneous tissue was irrigated and the skin edges were reapproximated using skin staples.  At this point a sterile towels placed over the staple incision line attention was create turned to maturing the ostomy. The small bowel was entered into using electrocautery to create the enterotomy. 0 chromic suture was then utilized to mature the ileostomy. This is a double barrel loop ileostomy. I was quite pleased with the appearance at this point  attention was turned to placement of the ostomy appliance. With this in place a sterile dressing was placed over the midline incision. The skin was washed and dried and was contractile. This trips removed the dressings were secured. The [name] was allowed to come out of general anesthetic. He was transferred back to a regular hospital bed and transferred to postanesthetic care unit in stable condition. At the conclusion of the procedure all instrument sponge and needle counts are correct the [name] tolerated  procedure additionally well. This ends the operative report on Marcella Dubs  CC a copy of this report to the [name]'s primary care physician a copy for Dr. Laurie Panda as well as a copy for my office thank you.         Significant Diagnostic Studies: Ct Abdomen Pelvis Wo Contrast  11/30/2010  *RADIOLOGY REPORT*  Clinical Data: Nausea, vomiting, renal dysfunction, history of carcinoma of the colon with lung metastases, post chemotherapy and radiation therapy, diabetes, hypertension, coronary disease, hepatic steatosis  CT ABDOMEN AND PELVIS WITHOUT CONTRAST  Technique:  Multidetector CT imaging of the abdomen and pelvis was performed following the standard protocol without intravenous contrast. Sagittal and coronal MPR images reconstructed from axial data set.  IV contrast not utilized due to renal dysfunction. Dilute oral contrast administered  Comparison: CT images from PET CT 10/13/2010  Findings: Tiny right lung base nodule image 10 stable. Coronary arterial calcifications. Small hiatal hernia. Bilateral renal cysts and small renal calculi, largest calculus 7 mm diameter lower pole left kidney. Within limits of a nonenhanced exam, no additional focal abnormalities of the liver, spleen, pancreas, kidneys, or adrenal glands. Remainder of stomach unremarkable.  Sub total colectomy with ileocolic anastomoses in pelvis. Rectum is decompressed but small bowel loops are diffusely dilated compatible with small bowel obstruction at the anastomoses. Soft tissue thickening is seen at this site; FDG accumulation was seen at this site on recent PET CT highly suspicious for residual or recurrent tumor. No definite bowel wall thickening, pneumatosis, free fluid, or free air.  No additional intra abdominal mass or adenopathy. Stable sclerotic focus in left ilium unchanged. Bones appear demineralized with mild scattered degenerative disc and facet disease changes of the thoracolumbar spine as well as chronic height loss  anteriorly at the T12 vertebral body.  IMPRESSION: Bilateral renal cysts and calculi. Small bowel obstruction secondary to obstruction at the thickened ileocolic anastomoses, with prior PET CT examination showing FDG accumulation at this site highly suspicious for residual or recurrent tumor. Interval cholecystectomy with small residual fluid and gas collection at gallbladder fossa.  Original Report Authenticated By: Lollie Marrow, M.D.   Ct Head Wo Contrast  11/30/2010  *RADIOLOGY REPORT*  Clinical Data: Syncope, diabetes, history rectal carcinoma  CT HEAD WITHOUT CONTRAST  Technique:  Contiguous axial images were obtained from the base of the skull through the vertex without contrast.  Comparison: None  Findings: Normal ventricular morphology. No midline shift or mass effect. Normal appearance of brain parenchyma. No intracranial hemorrhage, mass lesion, or acute infarction. Visualized paranasal sinuses and mastoid air cells clear. Bones unremarkable.  IMPRESSION: No acute intracranial abnormalities. It should be noted that a noncontrast CT exam is an insensitive modality for detection of small intracranial metastases. If CNS metastatic disease is weekly suspected, recommend MR imaging of the brain with and without contrast.  Original Report Authenticated By: Lollie Marrow, M.D.   Dg Chest Portable 1 View  11/29/2010  *RADIOLOGY REPORT*  Clinical Data: Weakness  PORTABLE CHEST -  1 VIEW  Comparison: 10/27/2010  Findings: 1.1 cm spiculated right upper lobe lung nodule, corresponding to biopsy proven metastatic adenocarcinoma.  Lungs are otherwise clear. No pleural effusion or pneumothorax.  Cardiomediastinal silhouette is within normal limits.  Surgical clips overlying the right upper abdomen.  Degenerative changes of the visualized thoracolumbar spine.  IMPRESSION: Stable right upper lobe lung nodule, corresponding to biopsy proven metastatic adenocarcinoma.  Otherwise, no acute cardiopulmonary abnormality.   Original Report Authenticated By: Charline Bills, M.D.   Dg Abd 2 Views  12/04/2010  *RADIOLOGY REPORT*  Clinical Data: Vomiting, evaluate for obstruction  ABDOMEN - 2 VIEW  Comparison: CT dated 11/30/2010  Findings: The [name] is status post subtotal colectomy with ileocolonic anastomosis.  Again seen are dilated loops of small bowel with air-fluid levels on the upright view, suspicious for small bowel obstruction.  No evidence of free air under the diaphragm on the upright view.  Surgical clips in the right upper abdomen.  Central venous catheter with its tip in the right mediastinum, incompletely visualized.  IMPRESSION: Dilated loops of small bowel, suspicious for small bowel obstruction, unchanged from recent CT.  Original Report Authenticated By: Charline Bills, M.D.   Dg Colon W/cm - Wo/w Kub  12/06/2010  *RADIOLOGY REPORT*  Clinical Data: Carcinoma of the colon post sub total colectomy with ileocolic anastomoses, an anastomotic obstruction and bowel dilatation  WATER SOLUBLE ENEMA:  Technique:  After insertion of an enema tip, a diagnostic enema was performed utilizing water soluble contrast material.  Contrast was administered retrograde into the colon by gravity.  Multiple images were obtained.  Total fluoroscopy time: 1.5 minutes  Comparison: Abdominal radiograph 12/04/2010  Findings: Marked dilatation of small bowel loops compatible with obstruction. Loops measure up to 10 cm in transverse diameter. No rectal gas identified. After insertion of an enema tip, water-soluble contrast material (Omnipaque-300) dilute with a small amount of water was instilled retrograde.  Marked narrowing of bowel wall at ileocolic anastomosis, with significant distention of small bowel to this site. Area of narrowing is irregular and recurrent tumor cannot be excluded radiographically. More proximally the small bowel is significantly distended without mass or filling defect. No bowel wall thickening or contrast  extravasation. Procedure tolerated well by [name].  IMPRESSION: High-grade obstruction at the ileocolic anastomosis with marked distention of small bowel loops proximal to anastomoses. The area of stricture is irregular, which could be result of edema or wall thickening but recurrent tumor is not excluded with this appearance.  Original Report Authenticated By: Lollie Marrow, M.D.    Lab Results: Results for orders placed during the hospital encounter of 11/29/10 (from the past 24 hour(s))  CBC     Status: Abnormal   Collection Time   12/13/10  4:59 AM      Component Value Range   WBC 3.8 (*) 4.0 - 10.5 (K/uL)   RBC 3.12 (*) 4.22 - 5.81 (MIL/uL)   Hemoglobin 9.5 (*) 13.0 - 17.0 (g/dL)   HCT 04.5 (*) 40.9 - 52.0 (%)   MCV 90.1  78.0 - 100.0 (fL)   MCH 30.4  26.0 - 34.0 (pg)   MCHC 33.8  30.0 - 36.0 (g/dL)   RDW 81.1 (*) 91.4 - 15.5 (%)   Platelets 50 (*) 150 - 400 (K/uL)  BASIC METABOLIC PANEL     Status: Abnormal   Collection Time   12/13/10  4:59 AM      Component Value Range   Sodium 134 (*) 135 - 145 (mEq/L)  Potassium 3.6  3.5 - 5.1 (mEq/L)   Chloride 107  96 - 112 (mEq/L)   CO2 24  19 - 32 (mEq/L)   Glucose, Bld 78  70 - 99 (mg/dL)   BUN 16  6 - 23 (mg/dL)   Creatinine, Ser 1.61  0.50 - 1.35 (mg/dL)   Calcium 7.7 (*) 8.4 - 10.5 (mg/dL)   GFR calc non Af Amer >60  >60 (mL/min)   GFR calc Af Amer >60  >60 (mL/min)   No results found for this or any previous visit (from the past 240 hour(s)).   Hospital Course: This for a pleasant 60 year old man was admitted with signs and symptoms of small bowel obstruction. Please see initial history and physical examination. [name], surgery was consulted and after failure of conservative treatment, he was taken to the operating room for surgery. The procedure involved creation of an ileostomy as outlined above in the surgery note. Postoperatively he has done well and his acute renal failure has resolved. He required aggressive  intravenous fluid hydration. He has been able to mobilize to some degree and will have teaching on  ileostomy care. He did require 2 units of blood transfusion as his hemoglobin decreased postoperatively. There is no evidence of any obvious bleeding however. He remains pancytopenic which is now a chronic problem.  Discharge Exam: Blood pressure 117/77, pulse 53, temperature 97.3 F (36.3 C), temperature source Oral, resp. rate 20, height 5\' 10"  (1.778 m), weight 81.6 kg (179 lb 14.3 oz), SpO2 99.00%. Today he looks systemically well. Heart sounds are present and normal without murmurs. Lung fields are clear. Abdomen is soft and nontender. Bowel sounds are heard. Neurologic: he is intact without any focal neurological signs.  Disposition: Home  Discharge Orders    Future Appointments: Provider: Department: Dept Phone: Center:   02/02/2011 9:50 AM Randall An, MD [facility] [phone] None     Future Orders Please Complete By Expires   Diet - low sodium heart healthy      Increase activity slowly      Discharge wound care:      Comments:   Per hospital routine      Follow-up Information    Follow up with ZIEGLER,BRENT C. Make an appointment in 1 week.   Contact information:   [address] [place] [address] [phone]          Signed: Wilson Singer 12/13/2010, 9:35 AM

## 2010-12-13 NOTE — Progress Notes (Signed)
Pt d/c instructions, medications and follow up appts discussed with pt and wife.  No issues or concerns stated.  Pt d/ced home with wife at this time.  M.Ryin Schillo, RN

## 2010-12-13 NOTE — Progress Notes (Signed)
716/2012 pt d/c home today arranged home health referral through [facility] and 3 in 1 [facility]sc through [facility]

## 2010-12-13 NOTE — Progress Notes (Signed)
12/13/10 1300  Colostomy RUQ  Removal Date/Time: 12/13/10 1312  Placement Date: 12/07/10   Location: RUQ  Ostomy Pouch 1 piece  Stoma Assessment Red  Peristomal Assessment Erythema  Treatment Pouch change  Output (mL) 300 mL   Pt with ileostomy, wife at bedside.  Pouch very full and leaking large amount tan liquid stool.  Stoma red and viable, above skin level, slight erythemia at 3 oclock near suture line.  Area remains very tender.  Reviewed pouching routines, ordering supplies, and dietary precautions.  Pt able to empty and reapply clamp without assistance.  Has been placed on [facility] discharge program and will receive home health after discharge.  Wife able to apply one piece pouch with minimal assistance. Pt denies further questions and is awaiting d/c home this am.

## 2010-12-20 ENCOUNTER — Encounter (HOSPITAL_COMMUNITY): Payer: Self-pay | Admitting: General Surgery

## 2010-12-20 NOTE — Consult Note (Signed)
[name], [name]               ACCOUNT NO.:  [id number]  MEDICAL RECORD NO.:  [id number]  LOCATION:                                 FACILITY:  PHYSICIAN:  Tilford Pillar, MD      DATE OF BIRTH:  1951/04/09  DATE OF CONSULTATION:  11/30/2010 DATE OF DISCHARGE:                                CONSULTATION   CHIEF COMPLAINT:  Persistent nausea and vomiting, intermittent diarrhea.  HISTORY OF PRESENT ILLNESS:  The patient is a 60 year old male well known to me with a history of colon cancer and recent cholecystectomy who had presented back to [facility] with increasing nausea, vomiting, and symptoms of dehydration.  He was admitted for IV fluid hydration and monitoring.  He has had bowel function.  He has had several loose stools over the last several days, although he does state episodes where he is unable to keep even fluids down.  This was similar to his symptomatology prior to his cholecystectomy and suspicion that his biliary symptoms were contributing.  At this point, he has had no symptoms of biliary etiology, but he does continue to have nausea and vomiting.  He has had no fevers and chills.  He does feel somewhat weak, otherwise unremarkable.  PAST MEDICAL HISTORY:  Colon cancer, suspected lung met, hypertension, coronary artery disease, history of renal calculi, sleep apnea.  PAST SURGICAL HISTORY:  Subtotal colectomy for colon cancer, laparoscopic cholecystectomy, previous cardiac stenting.  MEDICATIONS:  Zantac, B12 injections.  ALLERGIES:  DAYPRO.  SOCIAL HISTORY:  No current tobacco.  No alcohol use.  No recreational drug abuse.  PHYSICAL EXAMINATION:  VITAL SIGNS:  Temperature 98.1, heart rate 98, respirations 20, blood pressure 106/85. GENERAL:  He is not in any acute distress.  He is alert and oriented x3. He is pleasant.  He appears comfortable. HEENT:  Scalp:  No deformities or masses.  Eyes:  Pupils equal, round, reactive.  Extraocular movements are  intact.  No scleral icterus or conjunctival pallor is noted.  Oral mucosa pink.  Normal occlusion. NECK:  Trachea is midline.  No cervical lymphadenopathy. PULMONARY:  Unlabored respiration.  He is clear to auscultation bilaterally. CARDIOVASCULAR:  Regular rate and rhythm.  No murmurs or gallops.  He has 2+ radial and femoral pulses bilaterally. ABDOMEN:  Positive bowel sounds.  Abdomen is soft, nontender, moderately distended.  He does have again active bowel sounds.  No hernias or masses are apparent.  EXTREMITIES:  Warm and dry.  PERTINENT LABORATORY AND RADIOGRAPHIC STUDY:  CBC:  White blood cell count 3.1, hemoglobin 11.4, hematocrit 149.  Basic metabolic panel: Sodium 127, potassium 3.5, chloride 90, bicarb 28, BUN 61, creatinine 2.26, blood glucose 92.  C. diff is negative.  CT of the abdomen and pelvis does demonstrate some dilated loops of small intestine.  This is diffuse and was a suspected transition point at the previous ileocolonic anastomoses.  ASSESSMENT/PLAN:  Partial small-bowel obstruction.  At this point, I do have this suspicion that he is having some issues with the area of anastomoses, however, he is out from his anastomotic surgery that my suspicion that this is related to an inflammatory process is low. However,  it is also noted the patient has had a recent colonoscopy by Dr. Jena Gauss with biopsies of this area demonstrating no evidence of recurrence of his tumor, although this is still on my list of possibilities and this was discussed with the patient.  At this point as he is having bowel function, I would decrease the patient's oral intake, give the patient bowel rest.  If he continues to have persistent nausea and vomiting, consider placing a nasogastric tube for decompression.  At this point, the patient's exam is benign.  I will continue to follow, however, at this time I do not have any urgent or emergent reasons for proceeding to the operating room and  this was discussed with the patient as were indications for proceeding to the operating room.  He understands this and is comfortable with the planned discussion and course.     Tilford Pillar, MD     BZ/MEDQ  D:  12/20/2010  T:  12/20/2010  Job:  [id number]

## 2010-12-21 NOTE — H&P (Signed)
[name], [name]               ACCOUNT NO.:  [id number]  MEDICAL RECORD NO.:  [id number]  LOCATION:  A325                          FACILITY:  [facility]  PHYSICIAN:  Osvaldo Shipper, MD     DATE OF BIRTH:  12-Feb-1951  DATE OF ADMISSION:  11/29/2010 DATE OF DISCHARGE:  LH                             HISTORY & PHYSICAL   PRIMARY CARE PHYSICIAN:  Corrie Mckusick, MD  ONCOLOGIST:  [name]. [name], MD  GENERAL SURGEON:  Tilford Pillar, MD  CARDIOTHORACIC SURGEON:  [name], MD  ADMISSION DIAGNOSES: 1. Acute renal failure. 2. Hyponatremia. 3. Severe dehydration. 4. History of colon cancer. 5. Transaminitis. 6. History of coronary artery disease.  CHIEF COMPLAINT:  Near syncope.  HISTORY OF PRESENT ILLNESS:  The patient is a 59 year old Caucasian male who has a history of colon cancer who was admitted back in May for severe dehydration which resolved and he was readmitted in early June for similar complaints and was discharged after a few day stay.  He underwent a laparoscopic cholecystectomy during that surgery for concerns for acute cholecystitis.  The patient said that he did well for the initial 1 week and then, however, again started feeling weak.  Over the last few days, he has had some nausea, poor appetite even though he has been trying to drink Gatorade and water.  He has his usual 3-4 bowel movements every day which is watery.  He has had about 3 episodes of emesis over the last 2 days with food materials and some mucousy stuff and then today he was sitting on the recliner, got up to go to the bathroom.  When he reached the bathroom, his head started spinning.  He felt extremely lightheaded, went down on his knees, and was about to pass out and almost did but he feels like he did not.  Denies any chest pain or shortness of breath either before or after.  Denies any focal weakness or deficits either before or after.  He is wondering as to why he is getting these  symptoms every few weeks.  He denies any abdominal pain.  He denies any headaches.  Denies any fever, chills.  Denies any other sick contacts.  No recent antibiotic use.  The patient does mention of weight loss of about 40 pounds over the last 5-6 months.  PAST MEDICAL HISTORY:  Positive for cancer of the colon which is thought to be stage IV, recent lung metastasis was found for which the plan is to undergo some kind of surgery.  He has history of hypertension, coronary artery disease.  He had stents placed in 2003.  No history of kidney stones present.  The colon cancer was diagnosed originally in 2008, underwent colectomy, underwent radiation chemotherapy in 2009 and overlapped in 2010.  He was fine in 2011 and then all his symptoms started in 2012.  He underwent cholecystectomy about 2-1/2 weeks ago. He has history of diet-controlled diabetes as well as sleep apnea, history of B12 deficiency, history of fatty liver.  MEDICATIONS:  Zantac 150 mg twice daily and then he is on B12 injections every month.  ALLERGIES:  DAYPRO.  He was taken off  of his blood pressure medications during his previous hospitalization because of hypotension and he was also taken off of his simvastatin because of elevated liver enzymes.  SOCIAL HISTORY:  He lives in [place] with his wife.  He is on disability right now.  He quit smoking about 20 years ago.  Quit drinking about 6-7 months ago.  No illicit drug use.  He is independent with his daily activity.  FAMILY HISTORY:  His father died of rectal cancer.  Mother died of ovarian cancer.  REVIEW OF SYSTEMS:  GENERAL:  Positive for weakness, malaise.  HEENT: Unremarkable.  CARDIOVASCULAR:  As in HPI.  RESPIRATORY:  Unremarkable. GI:  As in HPI.  GU:  Unremarkable.  NEUROLOGIC:  Unremarkable. PSYCHIATRIC:  Unremarkable.  DERMATOLOGIC:  Unremarkable.  Other systems reviewed and found to be negative.  PHYSICAL EXAMINATION:  VITAL SIGNS:   Temperature 97.6, blood pressure when he came in was 80/57, improved to 100s/80s, heart rate initially 90- 117, currently about 70-80 regular, respiratory rate is 20, saturation 100% on room air. GENERAL:  This is a thin white male in no distress. HEENT:  Head is normocephalic, atraumatic.  Pupils are equal, reacting. Oral mucous membranes are very dry.  No oral lesions are noted. NECK:  Soft and supple.  No thyromegaly appreciated. LUNGS:  Clear to auscultation bilaterally with no wheezing, rales, or rhonchi. CARDIOVASCULAR:  S1, S2 is normal, regular.  No S3, S4, rubs, murmurs, or bruit. ABDOMEN:  Soft, nontender, nondistended.  Bowel sounds are present.  No masses or organomegaly appreciated. GU:  External examination unremarkable. MUSCULOSKELETAL:  Normal muscle mass and tone. NEUROLOGIC:  He is alert and oriented x3.  No focal neurological deficits are present.  He has got a soft 3 cm mass in the upper back, which appears to be fatty tissue, possible lipoma.  LABORATORY DATA:  His WBC is 8.2, hemoglobin is 12.7, platelet count is 162,000.  His sodium is 121, potassium is 3.6, chloride is 81, bicarb is 26, glucose is 111, BUN is 74, creatinine is 2.72.  His bilirubin is 1.5.  AST 72, ALT is 175, calcium is 9.0.  Cardiac enzymes negative x1. UA shows small bilirubin, trace ketones.  His CEA on June 13 was 5.4 which is mildly abnormal.  IMAGING STUDIES:  He had a chest x-ray which showed a stable right upper lobe lung nodule, otherwise no other acute process was noted.  He had an ultrasound of the abdomen on June 13 which showed hepatic steatosis and cholelithiasis.  A PET scan on May 16 showed increased uptake in the right lower lobe.  There was also uptake noted in the sigmoid anastomotic site consistent with residual or recurrent tumor.  No hepatic lesion was noted during that PET scan.  He had an EKG during this evaluation here in the ED which showed a sinus rhythm at 85  with normal axis intervals appear to be in the normal range.  No concerning Q-waves.  No concerning ST or T-wave changes are noted.  ASSESSMENT:  This is a 60 year old Caucasian male with a history of colon cancer who has had recurrent episodes of nausea, vomiting, dehydration over the last 2 months.  Etiology of this is not entirely clear.  He underwent a cholecystectomy last month thinking that may have been causing his symptoms.  However, his symptoms continue.  There was a concern raised in the PET scan about recurrent tumor in the anastomotic site.  There was also colonic air-fluid levels.  This  was not further investigated using a CT scan.  PLAN: 1. Severe dehydration with hyponatremia and acute renal failure.  He     will be treated with IV fluids. 2. Poor appetite, nausea, vomiting.  We will proceed with a CT scan of     his abdomen, pelvis.  This will give [place] the most comprehensive     information as to why he may be having recurrence of his symptoms     so quickly.  PPI will be given as well. 3. History of colon cancer.  Dr. [name] will be informed     of this admission. 4. History of CAD is stable. 5. The patient is a full code.  This was discussed with him.  Osvaldo Shipper, MD     GK/MEDQ  D:  11/29/2010  T:  11/29/2010  Job:  [id number]  cc:   Tilford Pillar, MD Fax: [phone]  [name]. [name], MD Fax: [phone]  Corrie Mckusick, M.D. Fax: [phone]  [name], MD [address] [place] [address]  Electronically Signed by Osvaldo Shipper MD on 12/21/2010 04:02:52 PM

## 2011-02-02 ENCOUNTER — Encounter (HOSPITAL_COMMUNITY): Payer: Managed Care, Other (non HMO) | Attending: Oncology | Admitting: Oncology

## 2011-02-02 ENCOUNTER — Encounter (HOSPITAL_COMMUNITY): Payer: Self-pay | Admitting: Oncology

## 2011-02-02 DIAGNOSIS — E538 Deficiency of other specified B group vitamins: Secondary | ICD-10-CM | POA: Insufficient documentation

## 2011-02-02 DIAGNOSIS — C7800 Secondary malignant neoplasm of unspecified lung: Secondary | ICD-10-CM

## 2011-02-02 DIAGNOSIS — D649 Anemia, unspecified: Secondary | ICD-10-CM

## 2011-02-02 DIAGNOSIS — C20 Malignant neoplasm of rectum: Secondary | ICD-10-CM

## 2011-02-02 DIAGNOSIS — C189 Malignant neoplasm of colon, unspecified: Secondary | ICD-10-CM

## 2011-02-02 DIAGNOSIS — D539 Nutritional anemia, unspecified: Secondary | ICD-10-CM

## 2011-02-02 LAB — DIFFERENTIAL
Basophils Absolute: 0 10*3/uL (ref 0.0–0.1)
Lymphocytes Relative: 21 % (ref 12–46)
Lymphs Abs: 0.9 10*3/uL (ref 0.7–4.0)
Neutro Abs: 2.8 10*3/uL (ref 1.7–7.7)

## 2011-02-02 LAB — COMPREHENSIVE METABOLIC PANEL
ALT: 9 U/L (ref 0–53)
AST: 16 U/L (ref 0–37)
Alkaline Phosphatase: 77 U/L (ref 39–117)
CO2: 26 mEq/L (ref 19–32)
Chloride: 107 mEq/L (ref 96–112)
GFR calc non Af Amer: >60 mL/min (ref >60)
Glucose, Bld: 80 mg/dL (ref 70–99)
Potassium: 4.3 mEq/L (ref 3.5–5.1)
Sodium: 140 mEq/L (ref 135–145)
Total Bilirubin: 0.5 mg/dL (ref 0.3–1.2)

## 2011-02-02 LAB — CBC
Platelets: 121 10*3/uL — ABNORMAL LOW (ref 150–400)
RBC: 4.13 MIL/uL — ABNORMAL LOW (ref 4.22–5.81)
RDW: 13.7 % (ref 11.5–15.5)
WBC: 4.2 10*3/uL (ref 4.0–10.5)

## 2011-02-02 MED ORDER — HEPARIN SOD (PORK) LOCK FLUSH 100 UNIT/ML IV SOLN
500.0000 [IU] | Freq: Once | INTRAVENOUS | Status: AC
  Administered 2011-02-02: 500 [IU] via INTRAVENOUS
  Filled 2011-02-02: qty 5

## 2011-02-02 MED ORDER — SODIUM CHLORIDE 0.9 % IJ SOLN
INTRAMUSCULAR | Status: AC
  Administered 2011-02-02: 10 mL via INTRAVENOUS
  Filled 2011-02-02: qty 10

## 2011-02-02 MED ORDER — HEPARIN SOD (PORK) LOCK FLUSH 100 UNIT/ML IV SOLN
INTRAVENOUS | Status: AC
  Administered 2011-02-02: 500 [IU] via INTRAVENOUS
  Filled 2011-02-02: qty 5

## 2011-02-02 MED ORDER — SODIUM CHLORIDE 0.9 % IJ SOLN
10.0000 mL | Freq: Once | INTRAMUSCULAR | Status: AC
  Administered 2011-02-02: 10 mL via INTRAVENOUS
  Filled 2011-02-02: qty 10

## 2011-02-02 NOTE — Progress Notes (Signed)
[name] presented for Portacath access and flush. Proper placement of portacath confirmed by CXR. Portacath located left chest wall accessed with  H 20 needle. Good blood return present.  Blood drawn for cbc/diff, cmet, B12 and cea Portacath flushed with 20ml NS and 500U/68ml Heparin and needle removed intact. Procedure without incident. Patient tolerated procedure well.

## 2011-02-02 NOTE — Progress Notes (Signed)
[name], MD [address] [place] [address]  1. Adenocarcinoma of rectum  CBC, Differential, Comprehensive metabolic panel, CEA, CBC, Differential, Comprehensive metabolic panel, CEA, CBC, Differential, Comprehensive metabolic panel, CEA  2. B12 deficiency  Vitamin B12, Vitamin B12, Vitamin B12  3. ANEMIA, CHRONIC      CURRENT THERAPY:S/P surgery on 04/22/2007.  He was treated with postoperative radiation and concomitant capecitabine and then 4 out of 6 planned cycles of oxaliplatin and capecitabine.  Unfortunately, the patient's counts could not support the last two cycles of chemotherapy.  The patient is now S/P biopsy of a right upper lobe mass that reveals metastatic adenocarcinoma of colon primary.  INTERVAL HISTORY: [name] 60 y.o. male returns for  regular  visit for followup of stage II adenocarcinoma of the rectum.  The patient reports that he is doing yard work and house work.  It sounds as though he is walking a good distance daily.  He reports that he spends approximately 50% of the daylight hours in a reclining position in a chair, sofa, or bed.  He is bathing himself without using a shower stall of bath tub.  He reports that he feels unsteady on his feet.  However, he explains that this has improved since being discharged form the hospital.   I spent a significant amount of time with the patient explaining that he is likely not a chemotherapy candidate due to his blood counts.  I recommended two options.  I suggested Hospice which he has declined at this time.  I did spend time with the patient going over the services Hospice can provide him and his family.  The patient is not dismissing Hospice all together, but he does not think he needs their services at this juncture.  The other option is to get blood work today, in one month's time, and return in one month to see how he is doing.  He has opted for this plan instead of Hospice today.   He likely will not be a candidate for chemotherapy.  Pending the patient's B12 level, we will reinitiate his B12 injections this week or next month on his follow-up appointment.  The patient denies any complaints today.    Past Medical History  Diagnosis Date  . Cancer     Colon ca dx 2008 surg/rad/chemo  . Pancytopenia, acquired 12/04/2010  . B12 deficiency 12/07/2010  . Lung cancer     has DIABETES MELLITUS, TYPE II; HYPERCHOLESTEROLEMIA; ANEMIA, CHRONIC; CORONARY ARTERY DISEASE; SLEEP APNEA; Adenocarcinoma of rectum; Pancytopenia, acquired; and B12 deficiency on his problem list.     is allergic to oxaprozin.  We administered Heparin Lock Flush, sodium chloride, sodium chloride, and Heparin Lock Flush.  Past Surgical History  Procedure Date  . Ileostomy 12/07/2010    Procedure: ILEOSTOMY;  Surgeon: [name];  Location: [facility];  Service: General;  Laterality: N/A;  Diverting Ileostomy Lysis of adhesions Exploratory Laparotomy  . Lung biopsy     rt lobe  . Cholecystectomy     Denies any headaches, dizziness, double vision, fevers, chills, night sweats, nausea, vomiting, diarrhea, constipation, chest pain, heart palpitations, shortness of breath, blood in stool, black tarry stool, urinary pain, urinary burning, urinary frequency, hematuria.   PHYSICAL EXAMINATION  ECOG PERFORMANCE STATUS: 3 - Symptomatic, >50% confined to bed  Filed Vitals:   02/02/11 0917  BP: 120/80  Pulse: 58  Temp: 97.9 F (36.6 C)    GENERAL:alert, no distress,  well nourished, well developed, comfortable, cooperative and smiling SKIN: skin color, texture, turgor are normal HEAD: Normocephalic EYES: normal EARS: External ears normal OROPHARYNX:mucous membranes are moist  NECK: trachea midline LYMPH:  No epitrochlear nodes noted BREAST:not examined LUNGS: clear to auscultation and percussion HEART: regular rate & rhythm, no murmurs, no gallops, S1 normal and S2 normal ABDOMEN:abdomen soft,  non-tender, normal bowel sounds and colostomy is producing stool appropriately. BACK: Back symmetric, no curvature., No CVA tenderness EXTREMITIES:less then 2 second capillary refill, no joint deformities, effusion, or inflammation, no edema, no skin discoloration, no clubbing, no cyanosis  NEURO: alert & oriented x 3 with fluent speech, no focal motor/sensory deficits, gait normal   LABORATORY DATA: CBC    Component Value Date/Time   WBC 4.2 02/02/2011 1034   RBC 4.13* 02/02/2011 1034   HGB 12.3* 02/02/2011 1034   HCT 37.5* 02/02/2011 1034   PLT 121* 02/02/2011 1034   MCV 90.8 02/02/2011 1034   MCH 29.8 02/02/2011 1034   MCHC 32.8 02/02/2011 1034   RDW 13.7 02/02/2011 1034   LYMPHSABS 0.9 02/02/2011 1034   MONOABS 0.5 02/02/2011 1034   EOSABS 0.1 02/02/2011 1034   BASOSABS 0.0 02/02/2011 1034     Chemistry      Component Value Date/Time   NA 140 02/02/2011 1034   K 4.3 02/02/2011 1034   CL 107 02/02/2011 1034   CO2 26 02/02/2011 1034   BUN 12 02/02/2011 1034   CREATININE 1.04 02/02/2011 1034      Component Value Date/Time   CALCIUM 9.6 02/02/2011 1034   ALKPHOS 77 02/02/2011 1034   AST 16 02/02/2011 1034   ALT 9 02/02/2011 1034   BILITOT 0.5 02/02/2011 1034     Lab Results  Component Value Date   CEA 5.4* 11/10/2010    RADIOGRAPHIC STUDIES:  11/30/10  *RADIOLOGY REPORT*  Clinical Data: Syncope, diabetes, history rectal carcinoma  CT HEAD WITHOUT CONTRAST  Technique: Contiguous axial images were obtained from the base of  the skull through the vertex without contrast.  Comparison: None  Findings:  Normal ventricular morphology.  No midline shift or mass effect.  Normal appearance of brain parenchyma.  No intracranial hemorrhage, mass lesion, or acute infarction.  Visualized paranasal sinuses and mastoid air cells clear.  Bones unremarkable.  IMPRESSION:  No acute intracranial abnormalities.  It should be noted that a noncontrast CT exam is an insensitive  modality for detection of small intracranial  metastases.  If CNS metastatic disease is weekly suspected, recommend MR imaging  of the brain with and without contrast.  Original Report Authenticated By: Lollie Marrow, M.D.   11/30/10  *RADIOLOGY REPORT*  Clinical Data: Nausea, vomiting, renal dysfunction, history of  carcinoma of the colon with lung metastases, post chemotherapy and  radiation therapy, diabetes, hypertension, coronary disease,  hepatic steatosis  CT ABDOMEN AND PELVIS WITHOUT CONTRAST  Technique: Multidetector CT imaging of the abdomen and pelvis was  performed following the standard protocol without intravenous  contrast. Sagittal and coronal MPR images reconstructed from axial  data set. IV contrast not utilized due to renal dysfunction.  Dilute oral contrast administered  Comparison: CT images from PET CT 10/13/2010  Findings:  Tiny right lung base nodule image 10 stable.  Coronary arterial calcifications.  Small hiatal hernia.  Bilateral renal cysts and small renal calculi, largest calculus 7  mm diameter lower pole left kidney.  Within limits of a nonenhanced exam, no additional focal  abnormalities of the liver, spleen, pancreas,  kidneys, or adrenal  glands.  Remainder of stomach unremarkable.  Sub total colectomy with ileocolic anastomoses in pelvis.  Rectum is decompressed but small bowel loops are diffusely dilated  compatible with small bowel obstruction at the anastomoses.  Soft tissue thickening is seen at this site; FDG accumulation was  seen at this site on recent PET CT highly suspicious for residual  or recurrent tumor.  No definite bowel wall thickening, pneumatosis, free fluid, or free  air.  No additional intra abdominal mass or adenopathy.  Stable sclerotic focus in left ilium unchanged.  Bones appear demineralized with mild scattered degenerative disc  and facet disease changes of the thoracolumbar spine as well as  chronic height loss anteriorly at the T12 vertebral body.    IMPRESSION:  Bilateral renal cysts and calculi.  Small bowel obstruction secondary to obstruction at the thickened  ileocolic anastomoses, with prior PET CT examination showing FDG  accumulation at this site highly suspicious for residual or  recurrent tumor.  Interval cholecystectomy with small residual fluid and gas  collection at gallbladder fossa.  Original Report Authenticated By: Lollie Marrow, M.D.   ASSESSMENT:  1. Metastatic colon cancer to lung 2. Anemia 3. B12 Deficiency   PLAN:  1. Lab work today: CBC diff, CMET, B12 level, CEA 2. Return in one month for follow-up 3. Consider Hospice 4. Patient education regarding his blood counts preventing chemotherapy. 5. Patient education regarding Hospice. 6. I personally reviewed and went over radiographic studies with the patient.   All questions were answered. The patient knows to call the clinic with any problems, questions or concerns. We can certainly see the patient much sooner if necessary.  The patient and plan discussed with Glenford Peers, MD and he is in agreement with the aforementioned.  More than 50% of the time spent with the patient was utilized for counseling.   KEFALAS,THOMAS

## 2011-02-02 NOTE — Patient Instructions (Signed)
[facility]  Discharge Instructions  RECOMMENDATIONS MADE BY THE CONSULTANT AND ANY TEST RESULTS WILL BE SENT TO YOUR REFERRING DOCTOR.   EXAM FINDINGS BY MD TODAY AND SIGNS AND SYMPTOMS TO REPORT TO CLINIC OR PRIMARY MD: Will check labs today and if Vitamin B 12 is low will bring you back for a B 12 injection  MEDICATIONS PRESCRIBED: none   INSTRUCTIONS GIVEN AND DISCUSSED: Other: report uncontrolled pain, shortness of breath, etc.  SPECIAL INSTRUCTIONS/FOLLOW-UP: Lab work Needed 10/3 and appointment with PA on 10/5.   I acknowledge that I have been informed and understand all the instructions given to me and received a copy. I do not have any more questions at this time, but understand that I may call the Specialty Clinic at [facility] at [phone] during business hours should I have any further questions or need assistance in obtaining follow-up care.    __________________________________________  _____________  __________ Signature of Patient or Authorized Representative            Date                   Time    __________________________________________ Nurse's Signature

## 2011-02-04 ENCOUNTER — Telehealth (HOSPITAL_COMMUNITY): Payer: Self-pay | Admitting: Oncology

## 2011-02-04 ENCOUNTER — Other Ambulatory Visit (HOSPITAL_COMMUNITY): Payer: Self-pay | Admitting: Oncology

## 2011-02-04 NOTE — Telephone Encounter (Signed)
Went over the patient lab work with him.  He knows that his CMET is WNL and his blood counts are impressively recovering and getting closer to normalizing.   His platelet count is 121,000, Hgb is nearly 13, WBCs WNL.  He is aware that his CEA is 58.9 compared to 5.4 months ago.    He will return as scheduled

## 2011-02-17 LAB — BASIC METABOLIC PANEL
CO2: 28
Chloride: 103
Creatinine, Ser: 1.34
GFR calc Af Amer: >60
Glucose, Bld: 106 — ABNORMAL HIGH

## 2011-02-17 LAB — CBC
MCHC: 34.2
MCV: 79.6
RBC: 4.09 — ABNORMAL LOW
RDW: 18.8 — ABNORMAL HIGH

## 2011-02-17 LAB — CEA: CEA: <0.5

## 2011-02-18 LAB — BASIC METABOLIC PANEL
BUN: 10
CO2: 27
Chloride: 106
Chloride: 106
Creatinine, Ser: 1.26
GFR calc Af Amer: >60
GFR calc non Af Amer: >60
Glucose, Bld: 102 — ABNORMAL HIGH
Potassium: 3.9
Sodium: 139
Sodium: 139

## 2011-02-18 LAB — DIFFERENTIAL
Basophils Absolute: 0
Basophils Relative: 0
Basophils Relative: 0
Eosinophils Absolute: 0
Eosinophils Absolute: 0.1
Eosinophils Absolute: 0
Eosinophils Relative: 2
Eosinophils Relative: 1
Lymphocytes Relative: 16
Lymphocytes Relative: 8 — ABNORMAL LOW
Lymphs Abs: 0.4 — ABNORMAL LOW
Lymphs Abs: 0.2 — ABNORMAL LOW
Monocytes Absolute: 0.4
Monocytes Absolute: 0.4
Monocytes Relative: 11
Monocytes Relative: 7
Neutro Abs: 3.1
Neutro Abs: 4.4
Neutrophils Relative %: 70
Neutrophils Relative %: 80 — ABNORMAL HIGH
Neutrophils Relative %: 88 — ABNORMAL HIGH

## 2011-02-18 LAB — CBC
HCT: 29.3 — ABNORMAL LOW
HCT: 30.7 — ABNORMAL LOW
Hemoglobin: 10.3 — ABNORMAL LOW
Hemoglobin: 11.4 — ABNORMAL LOW
MCHC: 35
MCV: 80.9
MCV: 82.7
MCV: 85.4
MCV: 87.6
Platelets: 106 — ABNORMAL LOW
Platelets: 98 — ABNORMAL LOW
Platelets: 136 — ABNORMAL LOW
RBC: 3.78 — ABNORMAL LOW
RBC: 3.87 — ABNORMAL LOW
RDW: 18.9 — ABNORMAL HIGH
RDW: 20.8 — ABNORMAL HIGH
WBC: 3.8 — ABNORMAL LOW
WBC: 5

## 2011-02-18 LAB — URINE MICROSCOPIC-ADD ON

## 2011-02-18 LAB — URINALYSIS, ROUTINE W REFLEX MICROSCOPIC
Glucose, UA: NEGATIVE
Hgb urine dipstick: NEGATIVE
Specific Gravity, Urine: >1.03 — ABNORMAL HIGH

## 2011-02-18 LAB — IRON AND TIBC
Saturation Ratios: 28
TIBC: 338
UIBC: 244

## 2011-02-18 LAB — FERRITIN: Ferritin: 54 (ref 22–322)

## 2011-02-21 LAB — CBC
Hemoglobin: 10.9 — ABNORMAL LOW
RBC: 3.33 — ABNORMAL LOW
WBC: 4.7

## 2011-02-21 LAB — COMPREHENSIVE METABOLIC PANEL
ALT: 18
AST: 20
CO2: 29
Calcium: 9.5
Chloride: 106
GFR calc Af Amer: >60
GFR calc non Af Amer: >60
Sodium: 140
Total Bilirubin: 0.8

## 2011-02-21 LAB — DIFFERENTIAL
Eosinophils Absolute: 0.1
Eosinophils Relative: 2
Lymphs Abs: 0.3 — ABNORMAL LOW

## 2011-02-22 LAB — COMPREHENSIVE METABOLIC PANEL
AST: 35
Albumin: 3.2 — ABNORMAL LOW
Calcium: 9.2
Chloride: 108
Creatinine, Ser: 1.27
GFR calc Af Amer: >60
Total Bilirubin: 0.8
Total Protein: 6.1

## 2011-02-22 LAB — CBC
MCV: 94.1
Platelets: 127 — ABNORMAL LOW
RDW: 19.5 — ABNORMAL HIGH
WBC: 3.1 — ABNORMAL LOW

## 2011-02-22 LAB — DIFFERENTIAL
Eosinophils Relative: 2
Lymphocytes Relative: 11 — ABNORMAL LOW
Lymphs Abs: 0.3 — ABNORMAL LOW
Monocytes Absolute: 0.4
Monocytes Relative: 13 — ABNORMAL HIGH

## 2011-02-23 LAB — CBC
HCT: 29.6 — ABNORMAL LOW
HCT: 29.7 — ABNORMAL LOW
Hemoglobin: 10.6 — ABNORMAL LOW
Hemoglobin: 10.7 — ABNORMAL LOW
MCHC: 35.6
MCHC: 36
MCV: 95.4
MCV: 95.9
MCV: 97.1
Platelets: 100 — ABNORMAL LOW
Platelets: 85 — ABNORMAL LOW
RBC: 2.98 — ABNORMAL LOW
RBC: 3.1 — ABNORMAL LOW
RDW: 19.3 — ABNORMAL HIGH
RDW: 19.5 — ABNORMAL HIGH
WBC: 2.2 — ABNORMAL LOW
WBC: 2.4 — ABNORMAL LOW
WBC: 2.7 — ABNORMAL LOW

## 2011-02-23 LAB — DIFFERENTIAL
Basophils Absolute: 0
Basophils Absolute: 0
Basophils Relative: 1
Eosinophils Absolute: 0.1
Eosinophils Absolute: 0.1
Eosinophils Absolute: 0.1
Eosinophils Absolute: 0.1
Eosinophils Relative: 3
Lymphocytes Relative: 12
Lymphocytes Relative: 13
Lymphocytes Relative: 14
Lymphs Abs: 0.3 — ABNORMAL LOW
Lymphs Abs: 0.3 — ABNORMAL LOW
Lymphs Abs: 0.4 — ABNORMAL LOW
Lymphs Abs: 0.4 — ABNORMAL LOW
Monocytes Absolute: 0.3
Monocytes Relative: 14 — ABNORMAL HIGH
Monocytes Relative: 15 — ABNORMAL HIGH
Neutrophils Relative %: 65
Neutrophils Relative %: 66
Neutrophils Relative %: 69
Neutrophils Relative %: 70

## 2011-02-23 LAB — COMPREHENSIVE METABOLIC PANEL
ALT: 28
CO2: 26
Calcium: 9
Chloride: 112
Creatinine, Ser: 1.17
GFR calc non Af Amer: >60
Glucose, Bld: 95
Total Bilirubin: 0.9

## 2011-02-23 LAB — IRON AND TIBC
Saturation Ratios: 43
UIBC: 203

## 2011-02-23 LAB — FOLATE: Folate: 10.8

## 2011-02-23 LAB — FERRITIN: Ferritin: 48 (ref 22–322)

## 2011-02-24 LAB — DIFFERENTIAL
Basophils Absolute: 0
Basophils Absolute: 0
Basophils Absolute: 0
Basophils Absolute: 0
Basophils Absolute: 0
Basophils Absolute: 0
Basophils Relative: 0
Basophils Relative: 1
Basophils Relative: 1
Basophils Relative: 0
Basophils Relative: 1
Eosinophils Absolute: 0
Eosinophils Absolute: 0.1
Eosinophils Relative: 1
Eosinophils Relative: 1
Eosinophils Relative: 1
Eosinophils Relative: 2
Lymphocytes Relative: 10 — ABNORMAL LOW
Lymphocytes Relative: 11 — ABNORMAL LOW
Lymphocytes Relative: 12
Lymphocytes Relative: 9 — ABNORMAL LOW
Lymphs Abs: 0.4 — ABNORMAL LOW
Lymphs Abs: 0.3 — ABNORMAL LOW
Monocytes Absolute: 0.4
Monocytes Absolute: 0.4
Monocytes Absolute: 0.4
Monocytes Absolute: 0.3
Monocytes Absolute: 0.4
Monocytes Relative: 15 — ABNORMAL HIGH
Monocytes Relative: 15 — ABNORMAL HIGH
Monocytes Relative: 14 — ABNORMAL HIGH
Neutro Abs: 3
Neutro Abs: 1.7
Neutro Abs: 2.7
Neutro Abs: 1.3 — ABNORMAL LOW
Neutro Abs: 2.3
Neutrophils Relative %: 73
Neutrophils Relative %: 71
Neutrophils Relative %: 77

## 2011-02-24 LAB — CBC
HCT: 28.4 — ABNORMAL LOW
HCT: 30.9 — ABNORMAL LOW
HCT: 29.4 — ABNORMAL LOW
HCT: 29.5 — ABNORMAL LOW
HCT: 29.1 — ABNORMAL LOW
Hemoglobin: 10 — ABNORMAL LOW
Hemoglobin: 10.1 — ABNORMAL LOW
Hemoglobin: 9.4 — ABNORMAL LOW
Hemoglobin: 9.9 — ABNORMAL LOW
MCHC: 34.1
MCHC: 33
MCHC: 33.8
MCV: 98
MCV: 98.4
MCV: 96.8
Platelets: 84 — ABNORMAL LOW
Platelets: 63 — ABNORMAL LOW
RBC: 2.9 — ABNORMAL LOW
RBC: 2.99 — ABNORMAL LOW
RBC: 3.13 — ABNORMAL LOW
RDW: 20.2 — ABNORMAL HIGH
RDW: 16.5 — ABNORMAL HIGH
RDW: 15.4
RDW: 15.6 — ABNORMAL HIGH
WBC: 2.6 — ABNORMAL LOW
WBC: 4.1
WBC: 2.4 — ABNORMAL LOW
WBC: 2.7 — ABNORMAL LOW

## 2011-02-24 LAB — COMPREHENSIVE METABOLIC PANEL
ALT: 28
AST: 47 — ABNORMAL HIGH
Albumin: 3.1 — ABNORMAL LOW
Albumin: 2.9 — ABNORMAL LOW
Alkaline Phosphatase: 66
Alkaline Phosphatase: 74
BUN: 7
CO2: 25
Chloride: 108
Chloride: 109
Creatinine, Ser: 1.37
GFR calc Af Amer: >60
GFR calc non Af Amer: 54 — ABNORMAL LOW
Glucose, Bld: 79
Potassium: 3.6
Potassium: 3.6
Sodium: 140
Total Bilirubin: 1
Total Bilirubin: 0.9
Total Protein: 5.5 — ABNORMAL LOW

## 2011-02-25 LAB — CBC
HCT: 33.2 — ABNORMAL LOW
HCT: 34.3 — ABNORMAL LOW
Hemoglobin: 11 — ABNORMAL LOW
MCV: 92.8
Platelets: 83 — ABNORMAL LOW
Platelets: 73 — ABNORMAL LOW
Platelets: 73 — ABNORMAL LOW
RBC: 3.69 — ABNORMAL LOW
RDW: 16 — ABNORMAL HIGH
RDW: 15.6 — ABNORMAL HIGH
WBC: 2.8 — ABNORMAL LOW
WBC: 2.5 — ABNORMAL LOW
WBC: 3.1 — ABNORMAL LOW

## 2011-02-25 LAB — DIFFERENTIAL
Basophils Absolute: 0
Basophils Absolute: 0
Basophils Absolute: 0
Eosinophils Absolute: 0
Eosinophils Absolute: 0.1
Eosinophils Absolute: 0.1
Eosinophils Relative: 2
Eosinophils Relative: 2
Lymphocytes Relative: 18
Lymphocytes Relative: 13
Lymphocytes Relative: 15
Lymphs Abs: 0.4 — ABNORMAL LOW
Lymphs Abs: 0.4 — ABNORMAL LOW
Monocytes Absolute: 0.3
Monocytes Relative: 13 — ABNORMAL HIGH
Neutro Abs: 1.6 — ABNORMAL LOW
Neutrophils Relative %: 70
Neutrophils Relative %: 71

## 2011-02-28 ENCOUNTER — Encounter: Payer: Self-pay | Admitting: Internal Medicine

## 2011-02-28 LAB — CBC
HCT: 37 — ABNORMAL LOW
MCHC: 33.8
MCV: 90.3
RBC: 4.1 — ABNORMAL LOW
WBC: 3.5 — ABNORMAL LOW

## 2011-02-28 LAB — COMPREHENSIVE METABOLIC PANEL
Alkaline Phosphatase: 112
BUN: 8
CO2: 26
Chloride: 109
Creatinine, Ser: 1.15
GFR calc non Af Amer: >60
Glucose, Bld: 73
Potassium: 3.5
Total Bilirubin: 1

## 2011-02-28 LAB — DIFFERENTIAL
Basophils Absolute: 0
Basophils Relative: 0
Lymphocytes Relative: 11 — ABNORMAL LOW
Monocytes Absolute: 0.3
Neutro Abs: 2.7
Neutrophils Relative %: 77

## 2011-02-28 LAB — GLUCOSE, CAPILLARY: Glucose-Capillary: 86

## 2011-03-02 ENCOUNTER — Encounter (HOSPITAL_COMMUNITY): Payer: Managed Care, Other (non HMO) | Attending: Oncology

## 2011-03-02 DIAGNOSIS — D539 Nutritional anemia, unspecified: Secondary | ICD-10-CM | POA: Insufficient documentation

## 2011-03-02 DIAGNOSIS — D61818 Other pancytopenia: Secondary | ICD-10-CM | POA: Insufficient documentation

## 2011-03-02 DIAGNOSIS — D509 Iron deficiency anemia, unspecified: Secondary | ICD-10-CM | POA: Insufficient documentation

## 2011-03-02 DIAGNOSIS — E538 Deficiency of other specified B group vitamins: Secondary | ICD-10-CM

## 2011-03-02 DIAGNOSIS — C20 Malignant neoplasm of rectum: Secondary | ICD-10-CM | POA: Insufficient documentation

## 2011-03-02 LAB — COMPREHENSIVE METABOLIC PANEL
ALT: 21 U/L (ref 0–53)
Albumin: 3.7 g/dL (ref 3.5–5.2)
Alkaline Phosphatase: 68 U/L (ref 39–117)
Chloride: 105 mEq/L (ref 96–112)
Glucose, Bld: 84 mg/dL (ref 70–99)
Potassium: 4.2 mEq/L (ref 3.5–5.1)
Sodium: 139 mEq/L (ref 135–145)
Total Bilirubin: 0.7 mg/dL (ref 0.3–1.2)
Total Protein: 7.2 g/dL (ref 6.0–8.3)

## 2011-03-02 LAB — DIFFERENTIAL
Basophils Relative: 0 % (ref 0–1)
Eosinophils Absolute: 0.1 10*3/uL (ref 0.0–0.7)
Lymphs Abs: 0.9 10*3/uL (ref 0.7–4.0)
Neutro Abs: 3.4 10*3/uL (ref 1.7–7.7)
Neutrophils Relative %: 69 % (ref 43–77)

## 2011-03-02 LAB — CBC
MCH: 28 pg (ref 26.0–34.0)
Platelets: 124 10*3/uL — ABNORMAL LOW (ref 150–400)
RBC: 4.57 MIL/uL (ref 4.22–5.81)

## 2011-03-02 MED ORDER — CYANOCOBALAMIN 1000 MCG/ML IJ SOLN
1000.0000 ug | Freq: Once | INTRAMUSCULAR | Status: AC
  Administered 2011-03-02: 1000 ug via INTRAMUSCULAR

## 2011-03-02 MED ORDER — HEPARIN SOD (PORK) LOCK FLUSH 100 UNIT/ML IV SOLN
INTRAVENOUS | Status: AC
  Administered 2011-03-02: 15:00:00 via INTRAVENOUS
  Filled 2011-03-02: qty 5

## 2011-03-02 MED ORDER — SODIUM CHLORIDE 0.9 % IJ SOLN
INTRAMUSCULAR | Status: AC
  Administered 2011-03-02: 15:00:00 via INTRAVENOUS
  Filled 2011-03-02: qty 10

## 2011-03-02 MED ORDER — CYANOCOBALAMIN 1000 MCG/ML IJ SOLN
INTRAMUSCULAR | Status: AC
  Administered 2011-03-02: 1000 ug via INTRAMUSCULAR
  Filled 2011-03-02: qty 1

## 2011-03-02 NOTE — Progress Notes (Signed)
Tolerated well. Good blood  Return.

## 2011-03-04 ENCOUNTER — Other Ambulatory Visit (HOSPITAL_COMMUNITY): Payer: Self-pay | Admitting: Oncology

## 2011-03-04 ENCOUNTER — Ambulatory Visit (HOSPITAL_COMMUNITY): Payer: Managed Care, Other (non HMO) | Admitting: Oncology

## 2011-03-04 DIAGNOSIS — E611 Iron deficiency: Secondary | ICD-10-CM

## 2011-03-04 LAB — CBC
Hemoglobin: 12.3 — ABNORMAL LOW
MCHC: 32.5
MCV: 78.9
RBC: 4.8

## 2011-03-04 LAB — DIFFERENTIAL
Eosinophils Absolute: 0.2
Lymphocytes Relative: 19
Lymphs Abs: 1.4
Neutro Abs: 5.2
Neutrophils Relative %: 72

## 2011-03-04 LAB — COMPREHENSIVE METABOLIC PANEL
BUN: 25 — ABNORMAL HIGH
CO2: 22
Calcium: 10.6 — ABNORMAL HIGH
Creatinine, Ser: 1.53 — ABNORMAL HIGH
GFR calc non Af Amer: 48 — ABNORMAL LOW
Glucose, Bld: 80

## 2011-03-04 LAB — FERRITIN: Ferritin: 103 (ref 22–322)

## 2011-03-04 LAB — PROTIME-INR: Prothrombin Time: 13.3

## 2011-03-04 LAB — MAGNESIUM: Magnesium: 2.5

## 2011-03-04 LAB — HEMOGLOBIN A1C: Hgb A1c MFr Bld: 5

## 2011-03-04 MED ORDER — FERUMOXYTOL INJECTION 510 MG/17 ML: 510.0000 mg | Freq: Once | INTRAVENOUS | Status: DC

## 2011-03-07 LAB — COMPREHENSIVE METABOLIC PANEL
ALT: 115 — ABNORMAL HIGH
ALT: 93 — ABNORMAL HIGH
AST: 76 — ABNORMAL HIGH
Albumin: 1.7 — ABNORMAL LOW
Albumin: 1.8 — ABNORMAL LOW
Albumin: 1.9 — ABNORMAL LOW
Alkaline Phosphatase: 149 — ABNORMAL HIGH
Alkaline Phosphatase: 150 — ABNORMAL HIGH
BUN: 13
BUN: 8
Calcium: 8.7
Chloride: 106
Chloride: 110
Creatinine, Ser: 0.97
Glucose, Bld: 85
Potassium: 4
Potassium: 4.2
Potassium: 4.2
Sodium: 141
Sodium: 142
Total Bilirubin: 0.7
Total Bilirubin: 0.8
Total Protein: 6.2

## 2011-03-07 LAB — DIFFERENTIAL
Basophils Absolute: 0
Basophils Relative: 0
Eosinophils Absolute: 0.1 — ABNORMAL LOW
Eosinophils Relative: 2
Monocytes Absolute: 0.6
Monocytes Relative: 8
Neutro Abs: 6.1

## 2011-03-07 LAB — CBC
Hemoglobin: 8.8 — ABNORMAL LOW
MCHC: 32
RDW: 23.8 — ABNORMAL HIGH

## 2011-03-08 LAB — COMPREHENSIVE METABOLIC PANEL
ALT: 39
ALT: 44
ALT: 95 — ABNORMAL HIGH
ALT: 96 — ABNORMAL HIGH
AST: 110 — ABNORMAL HIGH
AST: 32
AST: 50 — ABNORMAL HIGH
AST: 86 — ABNORMAL HIGH
Albumin: 1.3 — ABNORMAL LOW
Albumin: 1.3 — ABNORMAL LOW
Albumin: 1.4 — ABNORMAL LOW
Albumin: 1.7 — ABNORMAL LOW
Alkaline Phosphatase: 172 — ABNORMAL HIGH
Alkaline Phosphatase: 189 — ABNORMAL HIGH
BUN: 12
BUN: 13
BUN: 14
CO2: 23
CO2: 26
CO2: 28
CO2: 31
CO2: 32
Calcium: 8.4
Calcium: 8.7
Chloride: 100
Chloride: 104
Chloride: 106
Chloride: 111
Chloride: 112
Creatinine, Ser: 1
Creatinine, Ser: 1.01
Creatinine, Ser: 1.23
Creatinine, Ser: 1.43
Creatinine, Ser: 1.79 — ABNORMAL HIGH
GFR calc Af Amer: 47 — ABNORMAL LOW
GFR calc Af Amer: >60
GFR calc Af Amer: >60
GFR calc Af Amer: >60
GFR calc non Af Amer: 39 — ABNORMAL LOW
GFR calc non Af Amer: 40 — ABNORMAL LOW
GFR calc non Af Amer: 52 — ABNORMAL LOW
GFR calc non Af Amer: >60
GFR calc non Af Amer: >60
GFR calc non Af Amer: >60
Glucose, Bld: 145 — ABNORMAL HIGH
Glucose, Bld: 153 — ABNORMAL HIGH
Glucose, Bld: 154 — ABNORMAL HIGH
Glucose, Bld: 99
Potassium: 3.8
Potassium: 4.2
Sodium: 138
Sodium: 141
Sodium: 145
Total Bilirubin: 0.8
Total Bilirubin: 1.7 — ABNORMAL HIGH
Total Bilirubin: 1.7 — ABNORMAL HIGH
Total Bilirubin: 1.9 — ABNORMAL HIGH
Total Bilirubin: 2.5 — ABNORMAL HIGH
Total Protein: 5 — ABNORMAL LOW

## 2011-03-08 LAB — CBC
HCT: 26.1 — ABNORMAL LOW
HCT: 29 — ABNORMAL LOW
HCT: 29.3 — ABNORMAL LOW
HCT: 32.2 — ABNORMAL LOW
HCT: 33 — ABNORMAL LOW
HCT: 33.2 — ABNORMAL LOW
HCT: 33.4 — ABNORMAL LOW
HCT: 34.3 — ABNORMAL LOW
HCT: 34.3 — ABNORMAL LOW
Hemoglobin: 10.4 — ABNORMAL LOW
Hemoglobin: 10.4 — ABNORMAL LOW
Hemoglobin: 10.5 — ABNORMAL LOW
Hemoglobin: 10.7 — ABNORMAL LOW
Hemoglobin: 10.8 — ABNORMAL LOW
Hemoglobin: 11.3 — ABNORMAL LOW
Hemoglobin: 8.1 — ABNORMAL LOW
Hemoglobin: 8.8 — ABNORMAL LOW
Hemoglobin: 9.1 — ABNORMAL LOW
Hemoglobin: 9.3 — ABNORMAL LOW
MCHC: 30.6
MCHC: 30.7
MCHC: 31.1
MCHC: 31.3
MCHC: 31.4
MCHC: 31.5
MCHC: 31.6
MCHC: 31.7
MCHC: 31.9
MCHC: 32.2
MCHC: 30.1
MCHC: 30.5
MCV: 73.3 — ABNORMAL LOW
MCV: 73.8 — ABNORMAL LOW
MCV: 74 — ABNORMAL LOW
MCV: 74.1 — ABNORMAL LOW
MCV: 75 — ABNORMAL LOW
MCV: 75.2 — ABNORMAL LOW
MCV: 77 — ABNORMAL LOW
MCV: 77.3 — ABNORMAL LOW
MCV: 77.4 — ABNORMAL LOW
MCV: 77.8 — ABNORMAL LOW
MCV: 72.9 — ABNORMAL LOW
Platelets: 182
Platelets: 192
Platelets: 216
Platelets: 219
Platelets: 228
Platelets: 229
Platelets: 231
Platelets: 242
Platelets: 249
Platelets: 262
Platelets: 297
Platelets: 238
Platelets: 239
RBC: 3.6 — ABNORMAL LOW
RBC: 3.71 — ABNORMAL LOW
RBC: 3.74 — ABNORMAL LOW
RBC: 3.78 — ABNORMAL LOW
RBC: 4.26
RBC: 4.26
RBC: 4.48
RBC: 4.53
RBC: 4.56
RBC: 4.72
RBC: 4.84
RBC: 4.29
RDW: 23.5 — ABNORMAL HIGH
RDW: 23.6 — ABNORMAL HIGH
RDW: 24 — ABNORMAL HIGH
RDW: 24.9 — ABNORMAL HIGH
RDW: 25.6 — ABNORMAL HIGH
RDW: 25.9 — ABNORMAL HIGH
RDW: 26.6 — ABNORMAL HIGH
RDW: 27.8 — ABNORMAL HIGH
RDW: 27.9 — ABNORMAL HIGH
RDW: 29 — ABNORMAL HIGH
RDW: 29.8 — ABNORMAL HIGH
RDW: 30.5 — ABNORMAL HIGH
WBC: 10.8 — ABNORMAL HIGH
WBC: 11.5 — ABNORMAL HIGH
WBC: 13.9 — ABNORMAL HIGH
WBC: 15 — ABNORMAL HIGH
WBC: 17.9 — ABNORMAL HIGH
WBC: 21.2 — ABNORMAL HIGH
WBC: 7.6
WBC: 7.8
WBC: 8.9
WBC: 9.1
WBC: 9.3
WBC: 9.8

## 2011-03-08 LAB — BASIC METABOLIC PANEL
BUN: 10
BUN: 2 — ABNORMAL LOW
BUN: 3 — ABNORMAL LOW
BUN: 30 — ABNORMAL HIGH
BUN: 4 — ABNORMAL LOW
BUN: 7
BUN: 11
BUN: 8
CO2: 26
CO2: 27
CO2: 34 — ABNORMAL HIGH
CO2: 35 — ABNORMAL HIGH
CO2: 26
CO2: 28
Calcium: 7.7 — ABNORMAL LOW
Calcium: 7.8 — ABNORMAL LOW
Calcium: 8.8
Calcium: 8.8
Calcium: 8.9
Calcium: 9
Calcium: 9.3
Calcium: 9.1
Calcium: 9.3
Chloride: 102
Chloride: 103
Chloride: 98
Creatinine, Ser: 0.9
Creatinine, Ser: 0.99
Creatinine, Ser: 1.13
Creatinine, Ser: 2.13 — ABNORMAL HIGH
Creatinine, Ser: 3.58 — ABNORMAL HIGH
Creatinine, Ser: 1.03
Creatinine, Ser: 1.39
GFR calc Af Amer: 20 — ABNORMAL LOW
GFR calc Af Amer: 24 — ABNORMAL LOW
GFR calc Af Amer: 39 — ABNORMAL LOW
GFR calc Af Amer: >60
GFR calc non Af Amer: 18 — ABNORMAL LOW
GFR calc non Af Amer: 20 — ABNORMAL LOW
GFR calc non Af Amer: 33 — ABNORMAL LOW
GFR calc non Af Amer: 43 — ABNORMAL LOW
GFR calc non Af Amer: 44 — ABNORMAL LOW
GFR calc non Af Amer: >60
GFR calc non Af Amer: >60
GFR calc non Af Amer: >60
GFR calc non Af Amer: >60
GFR calc non Af Amer: >60
GFR calc non Af Amer: 53 — ABNORMAL LOW
Glucose, Bld: 107 — ABNORMAL HIGH
Glucose, Bld: 119 — ABNORMAL HIGH
Glucose, Bld: 130 — ABNORMAL HIGH
Glucose, Bld: 143 — ABNORMAL HIGH
Glucose, Bld: 58 — ABNORMAL LOW
Glucose, Bld: 60 — ABNORMAL LOW
Glucose, Bld: 96
Glucose, Bld: 147 — ABNORMAL HIGH
Potassium: 3.1 — ABNORMAL LOW
Potassium: 3.4 — ABNORMAL LOW
Potassium: 3.4 — ABNORMAL LOW
Potassium: 3.5
Potassium: 3.8
Potassium: 3.8
Sodium: 130 — ABNORMAL LOW
Sodium: 131 — ABNORMAL LOW
Sodium: 134 — ABNORMAL LOW
Sodium: 136
Sodium: 137
Sodium: 140
Sodium: 142

## 2011-03-08 LAB — DIFFERENTIAL
Basophils Absolute: 0
Basophils Absolute: 0
Basophils Absolute: 0
Basophils Absolute: 0
Basophils Absolute: 0
Basophils Absolute: 0
Basophils Absolute: 0
Basophils Absolute: 0
Basophils Absolute: 0.1
Basophils Absolute: 0.1
Basophils Absolute: 0.1
Basophils Absolute: 0.1
Basophils Absolute: 0.2 — ABNORMAL HIGH
Basophils Absolute: 0
Basophils Relative: 0
Basophils Relative: 0
Basophils Relative: 0
Basophils Relative: 0
Basophils Relative: 0
Basophils Relative: 0
Basophils Relative: 0
Basophils Relative: 1
Basophils Relative: 1
Basophils Relative: 1
Basophils Relative: 2 — ABNORMAL HIGH
Basophils Relative: 0
Eosinophils Absolute: 0 — ABNORMAL LOW
Eosinophils Absolute: 0 — ABNORMAL LOW
Eosinophils Absolute: 0.1 — ABNORMAL LOW
Eosinophils Absolute: 0.1 — ABNORMAL LOW
Eosinophils Absolute: 0.1 — ABNORMAL LOW
Eosinophils Absolute: 0.2
Eosinophils Absolute: 0.2
Eosinophils Absolute: 0.2
Eosinophils Absolute: 0.3
Eosinophils Absolute: 0 — ABNORMAL LOW
Eosinophils Relative: 0
Eosinophils Relative: 0
Eosinophils Relative: 1
Eosinophils Relative: 2
Eosinophils Relative: 2
Eosinophils Relative: 2
Eosinophils Relative: 2
Eosinophils Relative: 2
Eosinophils Relative: 3
Eosinophils Relative: 3
Lymphocytes Relative: 1 — ABNORMAL LOW
Lymphocytes Relative: 3 — ABNORMAL LOW
Lymphocytes Relative: 3 — ABNORMAL LOW
Lymphocytes Relative: 3 — ABNORMAL LOW
Lymphocytes Relative: 4 — ABNORMAL LOW
Lymphocytes Relative: 4 — ABNORMAL LOW
Lymphocytes Relative: 5 — ABNORMAL LOW
Lymphocytes Relative: 5 — ABNORMAL LOW
Lymphocytes Relative: 6 — ABNORMAL LOW
Lymphocytes Relative: 6 — ABNORMAL LOW
Lymphocytes Relative: 5 — ABNORMAL LOW
Lymphs Abs: 0.2 — ABNORMAL LOW
Lymphs Abs: 0.4 — ABNORMAL LOW
Lymphs Abs: 0.5 — ABNORMAL LOW
Lymphs Abs: 0.5 — ABNORMAL LOW
Lymphs Abs: 0.5 — ABNORMAL LOW
Lymphs Abs: 0.5 — ABNORMAL LOW
Lymphs Abs: 0.5 — ABNORMAL LOW
Lymphs Abs: 0.6 — ABNORMAL LOW
Lymphs Abs: 1.1
Monocytes Absolute: 0.5
Monocytes Absolute: 0.5
Monocytes Absolute: 0.6
Monocytes Absolute: 0.7
Monocytes Absolute: 0.8
Monocytes Absolute: 0.8
Monocytes Absolute: 0.8
Monocytes Absolute: 0.9
Monocytes Absolute: 0.9
Monocytes Absolute: 1
Monocytes Absolute: 0.5
Monocytes Relative: 10
Monocytes Relative: 10
Monocytes Relative: 11
Monocytes Relative: 5
Monocytes Relative: 6
Monocytes Relative: 7
Monocytes Relative: 7
Monocytes Relative: 9
Monocytes Relative: 9
Neutro Abs: 10 — ABNORMAL HIGH
Neutro Abs: 11.5 — ABNORMAL HIGH
Neutro Abs: 11.9 — ABNORMAL HIGH
Neutro Abs: 13.6 — ABNORMAL HIGH
Neutro Abs: 16.1 — ABNORMAL HIGH
Neutro Abs: 6.1
Neutro Abs: 6.8
Neutro Abs: 7.5
Neutro Abs: 7.8 — ABNORMAL HIGH
Neutro Abs: 8.1 — ABNORMAL HIGH
Neutrophils Relative %: 82 — ABNORMAL HIGH
Neutrophils Relative %: 84 — ABNORMAL HIGH
Neutrophils Relative %: 84 — ABNORMAL HIGH
Neutrophils Relative %: 88 — ABNORMAL HIGH
Neutrophils Relative %: 88 — ABNORMAL HIGH
Neutrophils Relative %: 89 — ABNORMAL HIGH
Neutrophils Relative %: 90 — ABNORMAL HIGH
Neutrophils Relative %: 92 — ABNORMAL HIGH

## 2011-03-08 LAB — BLOOD GAS, ARTERIAL
Acid-Base Excess: 5.4 — ABNORMAL HIGH
Acid-base deficit: 0.6
Bicarbonate: 23.6
Bicarbonate: 31.1 — ABNORMAL HIGH
MECHVT: 650
Mode: POSITIVE
O2 Content: 3
O2 Saturation: 95.6
O2 Saturation: 97
PEEP: 5
PEEP: 5
PIP: 5
Patient temperature: 37
Patient temperature: 37
RATE: 12
TCO2: 21.5
pCO2 arterial: 45.5 — ABNORMAL HIGH
pCO2 arterial: 47.4 — ABNORMAL HIGH
pH, Arterial: 7.347 — ABNORMAL LOW
pH, Arterial: 7.407
pO2, Arterial: 115 — ABNORMAL HIGH
pO2, Arterial: 79.4 — ABNORMAL LOW
pO2, Arterial: 82.9

## 2011-03-08 LAB — CLOSTRIDIUM DIFFICILE EIA

## 2011-03-08 LAB — CROSSMATCH
ABO/RH(D): A POS
ABO/RH(D): A POS
Antibody Screen: NEGATIVE

## 2011-03-08 LAB — PROTIME-INR
INR: 1.2
Prothrombin Time: 15.8 — ABNORMAL HIGH

## 2011-03-08 LAB — WOUND CULTURE

## 2011-03-08 LAB — PHOSPHORUS
Phosphorus: 2.9
Phosphorus: 3
Phosphorus: 4.2
Phosphorus: 4.9 — ABNORMAL HIGH
Phosphorus: 5.1 — ABNORMAL HIGH

## 2011-03-08 LAB — TRIGLYCERIDES: Triglycerides: 98

## 2011-03-08 LAB — MAGNESIUM
Magnesium: 1.6
Magnesium: 1.6
Magnesium: 1.7
Magnesium: 1.7
Magnesium: 1.8
Magnesium: 1.8
Magnesium: 1.8
Magnesium: 1.9
Magnesium: 2
Magnesium: 2
Magnesium: 2.6 — ABNORMAL HIGH

## 2011-03-08 LAB — ALBUMIN: Albumin: 1.8 — ABNORMAL LOW

## 2011-03-08 LAB — CHOLESTEROL, TOTAL: Cholesterol: 63

## 2011-03-08 LAB — FERRITIN: Ferritin: 23 (ref 22–322)

## 2011-03-09 LAB — CBC
HCT: 21.1 — ABNORMAL LOW
Hemoglobin: 6.3 — CL
MCHC: 27.5 — ABNORMAL LOW
MCHC: 29.6 — ABNORMAL LOW
MCHC: 29.7 — ABNORMAL LOW
MCV: 54.6 — ABNORMAL LOW
MCV: 66.6 — ABNORMAL LOW
Platelets: 214
Platelets: 242
Platelets: 258
RBC: 2.55 — ABNORMAL LOW
RDW: 37.2 — ABNORMAL HIGH
RDW: 37.7 — ABNORMAL HIGH
WBC: 5.2

## 2011-03-09 LAB — CROSSMATCH: ABO/RH(D): A POS

## 2011-03-09 LAB — COMPREHENSIVE METABOLIC PANEL
ALT: 14
AST: 21
Albumin: 3.4 — ABNORMAL LOW
Alkaline Phosphatase: 75
CO2: 30
Chloride: 105
Total Protein: 6.9

## 2011-03-09 LAB — ABO/RH: ABO/RH(D): A POS

## 2011-03-10 ENCOUNTER — Encounter (HOSPITAL_BASED_OUTPATIENT_CLINIC_OR_DEPARTMENT_OTHER): Payer: Managed Care, Other (non HMO) | Admitting: Oncology

## 2011-03-10 ENCOUNTER — Encounter (HOSPITAL_COMMUNITY): Payer: Self-pay | Admitting: Oncology

## 2011-03-10 ENCOUNTER — Encounter (HOSPITAL_BASED_OUTPATIENT_CLINIC_OR_DEPARTMENT_OTHER): Payer: Managed Care, Other (non HMO)

## 2011-03-10 VITALS — BP 133/87 | HR 68 | Temp 98.0°F | Wt 195.7 lb

## 2011-03-10 DIAGNOSIS — D539 Nutritional anemia, unspecified: Secondary | ICD-10-CM

## 2011-03-10 DIAGNOSIS — D509 Iron deficiency anemia, unspecified: Secondary | ICD-10-CM

## 2011-03-10 DIAGNOSIS — C20 Malignant neoplasm of rectum: Secondary | ICD-10-CM

## 2011-03-10 DIAGNOSIS — C19 Malignant neoplasm of rectosigmoid junction: Secondary | ICD-10-CM

## 2011-03-10 MED ORDER — HEPARIN SOD (PORK) LOCK FLUSH 100 UNIT/ML IV SOLN
INTRAVENOUS | Status: AC
  Administered 2011-03-10: 500 [IU]
  Filled 2011-03-10: qty 5

## 2011-03-10 MED ORDER — SODIUM CHLORIDE 0.9 % IJ SOLN
INTRAMUSCULAR | Status: AC
  Administered 2011-03-10: 10 mL
  Filled 2011-03-10: qty 10

## 2011-03-10 MED ORDER — SODIUM CHLORIDE 0.9 % IV SOLN
Freq: Once | INTRAVENOUS | Status: AC
  Administered 2011-03-10: 500 mL via INTRAVENOUS

## 2011-03-10 MED ORDER — SODIUM CHLORIDE 0.9 % IV SOLN
1020.0000 mg | Freq: Once | INTRAVENOUS | Status: AC
  Administered 2011-03-10: 1020 mg via INTRAVENOUS
  Filled 2011-03-10: qty 34

## 2011-03-10 NOTE — Patient Instructions (Addendum)
[facility]  Discharge Instructions  RECOMMENDATIONS MADE BY THE CONSULTANT AND ANY TEST RESULTS WILL BE SENT TO YOUR REFERRING DOCTOR.   EXAM FINDINGS BY MD TODAY AND SIGNS AND SYMPTOMS TO REPORT TO CLINIC OR PRIMARY MD:   PET scan scheduled for 10/17 Wednesday @ 7. The actual scan will take place at 8:30 but you must arrive at 7 for registration/injection. Do not eat or drink 6 hours prior to the PET scan. Also, do not eat candy or gum before the test. Not even sugarless candy/gum.  Return in 3 weeks to see Dr. [name] Monday 03/21/11 @ 2:30  Return in 1 month for CBC/Ferritin on 04/07/11 @ 10  I acknowledge that I have been informed and understand all the instructions given to me and received a copy. I do not have any more questions at this time, but understand that I may call the Specialty Clinic at [facility] at [phone] during business hours should I have any further questions or need assistance in obtaining follow-up care.    __________________________________________  _____________  __________ Signature of Patient or Authorized Representative            Date                   Time    __________________________________________ Nurse's Signature

## 2011-03-10 NOTE — Progress Notes (Signed)
Tolerated iron well 

## 2011-03-10 NOTE — Progress Notes (Signed)
[name], MD [address] [place] [address]  1. Iron deficiency anemia  ferumoxytol (FERAHEME) 1,020 mg in sodium chloride 0.9 % 100 mL IVPB, CBC, Ferritin  2. Adenocarcinoma of rectum  CEA, NM PET Image Restag (PS) Skull Base To Thigh    CURRENT THERAPY:S/P surgery on 04/22/2007. He was treated with postoperative radiation and concomitant capecitabine and then 4 out of 6 planned cycles of oxaliplatin and capecitabine. Unfortunately, the patient's counts could not support the last two cycles of chemotherapy. The patient is now S/P biopsy of a right upper lobe mass that reveals metastatic adenocarcinoma of colon primary.   INTERVAL HISTORY: [name] 60 y.o. male returns for  regular  visit for followup of  stage II adenocarcinoma of the rectum.   The patient was concerned about his lab work.  As a result, I personally reviewed and went over laboratory results with the patient.  His lab work continues to reveal mild thrombocytopenia.  He also understands that his CEA has been increasing.  We had a long discussion about how do we proceed.  The patient is not too keen on Hospice at this point in time.  He would like to explore his options.  He knows that chemotherapy is not an option because his blood counts will likely not only bottom out, but may never recover.  His understanding from seeing Dr. Tyrone Sage last was that surgery may be an option for him.  It is has been slightly over 3 months since his last radiographic study of the chest and/or abdomen. As result, it is reasonable to perform a PET scan to evaluate for progression and extent of disease. This may be a critical determinant in this patient's future there appeared options which may include radiation and/or surgical resection. As Dr. Tyrone Sage mentioned in his previous note, the patient does have evidence of renal insufficiency and therefore may be at increased risk for surgical and anesthetic  complications.  The patient reports that his appetite is strong. He is pleased with his energy level. All this plays to the idea that the patient does not want to quit therapy for his cancer. He explains that he feels too well to give up.  Past Medical History  Diagnosis Date  . Cancer     Colon ca dx 2008 surg/rad/chemo  . Pancytopenia, acquired 12/04/2010  . B12 deficiency 12/07/2010  . Lung cancer     has DIABETES MELLITUS, TYPE II; HYPERCHOLESTEROLEMIA; ANEMIA, CHRONIC; CORONARY ARTERY DISEASE; SLEEP APNEA; Adenocarcinoma of rectum; Pancytopenia, acquired; and B12 deficiency on his problem list.     is allergic to oxaprozin.  [name] had no medications administered during this visit.  Past Surgical History  Procedure Date  . Ileostomy 12/07/2010    Procedure: ILEOSTOMY;  Surgeon: [name];  Location: [facility];  Service: General;  Laterality: N/A;  Diverting Ileostomy Lysis of adhesions Exploratory Laparotomy  . Lung biopsy     rt lobe  . Cholecystectomy     Denies any headaches, dizziness, double vision, fevers, chills, night sweats, nausea, vomiting, diarrhea, constipation, chest pain, heart palpitations, shortness of breath, blood in stool, black tarry stool, urinary pain, urinary burning, urinary frequency, hematuria.   PHYSICAL EXAMINATION  ECOG PERFORMANCE STATUS: 1 - Symptomatic but completely ambulatory  Filed Vitals:   03/10/11 1029  BP: 133/87  Pulse: 68  Temp: 98 F (36.7 C)    GENERAL:alert, no distress, well nourished, well developed, comfortable, cooperative and  smiling SKIN: skin color, texture, turgor are normal HEAD: Normocephalic EYES: normal EARS: External ears normal OROPHARYNX:mucous membranes are moist  NECK: trachea midline LYMPH:  not examined BREAST:not examined LUNGS: clear to auscultation and percussion HEART: regular rate & rhythm, no murmurs, no gallops, S1 normal and S2 normal ABDOMEN:abdomen soft, non-tender, normal bowel  sounds and colostomy putting out yellow liquid stool BACK: Back symmetric, no curvature., No CVA tenderness EXTREMITIES:less then 2 second capillary refill, no joint deformities, effusion, or inflammation, no edema, no skin discoloration, no clubbing, no cyanosis  NEURO: alert & oriented x 3 with fluent speech, no focal motor/sensory deficits, gait normal  LABORATORY DATA: CBC    Component Value Date/Time   WBC 4.9 03/02/2011 1500   RBC 4.57 03/02/2011 1500   HGB 12.8* 03/02/2011 1500   HCT 39.3 03/02/2011 1500   PLT 124* 03/02/2011 1500   MCV 86.0 03/02/2011 1500   MCH 28.0 03/02/2011 1500   MCHC 32.6 03/02/2011 1500   RDW 13.7 03/02/2011 1500   LYMPHSABS 0.9 03/02/2011 1500   MONOABS 0.5 03/02/2011 1500   EOSABS 0.1 03/02/2011 1500   BASOSABS 0.0 03/02/2011 1500    Lab Results  Component Value Date   CEA 58.9* 02/02/2011      PENDING LABS: CEA    ASSESSMENT:  1. Metastatic colon cancer to lung, biopsy-proven. 2. Mild from cytopenia 3. Completion of postoperative radiation and concomitant capecitabine chemotherapy followed by 4 of the 6 planned cycles of oxaliplatin and capecitabine. Unfortunately his blood counts will not allow [place] to treat him for the last 2 cycles of chemotherapy.  PLAN:  1. PET scan within the next week or 2.  2. CEA today 3. Feraheme 1020 mg infusion today 4. Lab work in 1 month: CBC, ferritin 5. Return in 3 weeks to go over PET scan results and discuss options. 6. I personally reviewed and went over laboratory results with the patient.   All questions were answered. The patient knows to call the clinic with any problems, questions or concerns. We can certainly see the patient much sooner if necessary.  More than 50% of the time spent with the patient was utilized for counseling.   KEFALAS,THOMAS

## 2011-03-11 LAB — CEA: CEA: 19.6 ng/mL — ABNORMAL HIGH (ref 0.0–5.0)

## 2011-03-16 ENCOUNTER — Other Ambulatory Visit (HOSPITAL_COMMUNITY): Payer: Managed Care, Other (non HMO)

## 2011-03-21 ENCOUNTER — Encounter (HOSPITAL_BASED_OUTPATIENT_CLINIC_OR_DEPARTMENT_OTHER): Payer: Managed Care, Other (non HMO) | Admitting: Oncology

## 2011-03-21 ENCOUNTER — Encounter (HOSPITAL_COMMUNITY): Payer: Managed Care, Other (non HMO)

## 2011-03-21 VITALS — BP 127/91 | HR 77 | Temp 97.8°F | Wt 200.0 lb

## 2011-03-21 DIAGNOSIS — C7800 Secondary malignant neoplasm of unspecified lung: Secondary | ICD-10-CM

## 2011-03-21 DIAGNOSIS — C20 Malignant neoplasm of rectum: Secondary | ICD-10-CM

## 2011-03-21 NOTE — Patient Instructions (Signed)
[facility]  Discharge Instructions  RECOMMENDATIONS MADE BY THE CONSULTANT AND ANY TEST RESULTS WILL BE SENT TO YOUR REFERRING DOCTOR.  SPECIAL INSTRUCTIONS/FOLLOW-UP: Return to Clinic on in 2 weeks to see Elijah Birk and we are scheduling your PET scan.  See the front desk for your appointments.   I acknowledge that I have been informed and understand all the instructions given to me and received a copy. I do not have any more questions at this time, but understand that I may call the Specialty Clinic at [facility] at [phone] during business hours should I have any further questions or need assistance in obtaining follow-up care.    __________________________________________  _____________  __________ Signature of Patient or Authorized Representative            Date                   Time    __________________________________________ Nurse's Signature

## 2011-03-21 NOTE — Progress Notes (Signed)
This office note has been dictated.

## 2011-03-21 NOTE — Progress Notes (Signed)
CC:   [name] Plane, MD [name] Pillar, MD [name], M.D.  DIAGNOSES: 1. 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 2. Acute cholecystitis status post cholecystectomy by Dr. Leticia Penna. 3. History of hypertension. 4. History of partial bowel obstruction status post resection with     what looks like a temporary colostomy on 11/12/2010 with the     cholecystectomy and then he was readmitted to the hospital in July     requiring surgical intervention again, but no tumor was found at     this time, only inflammation of the mucosa with the erosions and     focal ulceration with acute inflammation and hemorrhage in the     small intestine where there was this partial bowel obstruction. He is accompanied by his wife today.  They still are very good friends even though they live apart.  What is amazing is that his labs show that his cancer marker has dropped on its own from 58.9 to 19.6.  That was from September 5th to October 11th.  I am not sure I understand that. Interestingly, also, his blood counts in September and October are the best that they have been really in some time.  His platelets are backup over 100,000, hemoglobin is over 12 g, white count is over 4000.  Of course, he is our gentleman that started out with chemotherapy for a total 6 cycles, only could complete 4 because of thrombocytopenia and leukopenia.  The plan is now, since he looks so good and feels good, he is really looking so much better, to get his PET scan done to see if he has any new lesions besides the one in the lung, which we know to be colorectal cancer.  If there is nothing else then perhaps get Dr. Tyrone Sage to resect him.  If there is a way we could give him reduced-dose oxaliplatin with 5-FU, leucovorin, FOLFOX regimen with Avastin, I think we ought to try it.  Even if the oxaliplatin is 50% of normal and the 5- FU is 80% to 90% of normal, and the Avastin is full dose, it  might help keep this at bay.  We are going to get the PET scan and see him back. We will continue his B12 shots of course since he has documented the need for B12 from the past.  We will continue the monthly B12 shots.  He really looks and feels great today.    ______________________________ [name]. [name], MD ESN/MEDQ  D:  03/21/2011  T:  03/21/2011  Job:  [id number]

## 2011-03-25 ENCOUNTER — Ambulatory Visit (HOSPITAL_COMMUNITY): Payer: Managed Care, Other (non HMO)

## 2011-03-28 ENCOUNTER — Encounter (HOSPITAL_COMMUNITY)
Admit: 2011-03-28 | Discharge: 2011-03-28 | Disposition: A | Discharge status: Home / Self Care | Payer: Managed Care, Other (non HMO) | Attending: Oncology | Admitting: Oncology

## 2011-03-28 DIAGNOSIS — R918 Other nonspecific abnormal finding of lung field: Secondary | ICD-10-CM | POA: Insufficient documentation

## 2011-03-28 DIAGNOSIS — N200 Calculus of kidney: Secondary | ICD-10-CM | POA: Insufficient documentation

## 2011-03-28 DIAGNOSIS — C20 Malignant neoplasm of rectum: Secondary | ICD-10-CM | POA: Insufficient documentation

## 2011-03-28 DIAGNOSIS — Z9089 Acquired absence of other organs: Secondary | ICD-10-CM | POA: Insufficient documentation

## 2011-03-28 DIAGNOSIS — Z980 Intestinal bypass and anastomosis status: Secondary | ICD-10-CM | POA: Insufficient documentation

## 2011-03-28 DIAGNOSIS — Z932 Ileostomy status: Secondary | ICD-10-CM | POA: Insufficient documentation

## 2011-03-28 DIAGNOSIS — K7689 Other specified diseases of liver: Secondary | ICD-10-CM | POA: Insufficient documentation

## 2011-03-28 LAB — GLUCOSE, CAPILLARY: Glucose-Capillary: 87 mg/dL (ref 70–99)

## 2011-03-28 MED ORDER — FLUDEOXYGLUCOSE F - 18 (FDG) INJECTION
19.7000 | Freq: Once | INTRAVENOUS | Status: AC | PRN
Start: 2011-03-28 — End: 2011-03-28
  Administered 2011-03-28: 19.7 via INTRAVENOUS

## 2011-04-04 ENCOUNTER — Ambulatory Visit (HOSPITAL_COMMUNITY): Payer: Managed Care, Other (non HMO)

## 2011-04-04 ENCOUNTER — Encounter (HOSPITAL_COMMUNITY): Payer: Managed Care, Other (non HMO) | Attending: Oncology | Admitting: Oncology

## 2011-04-04 ENCOUNTER — Encounter (HOSPITAL_COMMUNITY): Payer: Self-pay | Admitting: Oncology

## 2011-04-04 ENCOUNTER — Encounter (HOSPITAL_COMMUNITY): Payer: Managed Care, Other (non HMO)

## 2011-04-04 DIAGNOSIS — E538 Deficiency of other specified B group vitamins: Secondary | ICD-10-CM | POA: Insufficient documentation

## 2011-04-04 DIAGNOSIS — C189 Malignant neoplasm of colon, unspecified: Secondary | ICD-10-CM | POA: Insufficient documentation

## 2011-04-04 DIAGNOSIS — C20 Malignant neoplasm of rectum: Secondary | ICD-10-CM

## 2011-04-04 DIAGNOSIS — D509 Iron deficiency anemia, unspecified: Secondary | ICD-10-CM | POA: Insufficient documentation

## 2011-04-04 DIAGNOSIS — C7800 Secondary malignant neoplasm of unspecified lung: Secondary | ICD-10-CM

## 2011-04-04 DIAGNOSIS — C787 Secondary malignant neoplasm of liver and intrahepatic bile duct: Secondary | ICD-10-CM

## 2011-04-04 MED ORDER — HEPARIN SOD (PORK) LOCK FLUSH 100 UNIT/ML IV SOLN
INTRAVENOUS | Status: AC
  Filled 2011-04-04: qty 5

## 2011-04-04 MED ORDER — HEPARIN SOD (PORK) LOCK FLUSH 100 UNIT/ML IV SOLN
500.0000 [IU] | Freq: Once | INTRAVENOUS | Status: DC
  Filled 2011-04-04: qty 5

## 2011-04-04 MED ORDER — CYANOCOBALAMIN 1000 MCG/ML IJ SOLN
1000.0000 ug | Freq: Once | INTRAMUSCULAR | Status: AC
  Administered 2011-04-04: 1000 ug via INTRAMUSCULAR

## 2011-04-04 MED ORDER — SODIUM CHLORIDE 0.9 % IJ SOLN
INTRAMUSCULAR | Status: AC
  Filled 2011-04-04: qty 10

## 2011-04-04 MED ORDER — SODIUM CHLORIDE 0.9 % IJ SOLN
10.0000 mL | INTRAMUSCULAR | Status: DC | PRN
  Filled 2011-04-04: qty 10

## 2011-04-04 MED ORDER — CYANOCOBALAMIN 1000 MCG/ML IJ SOLN
INTRAMUSCULAR | Status: AC
  Filled 2011-04-04: qty 1

## 2011-04-04 NOTE — Progress Notes (Signed)
[name] Ribas, MD [address] [place] [address]  1. Iron deficiency anemia  CBC, Ferritin, Ferritin  2. B12 deficiency  cyanocobalamin ((VITAMIN B-12)) injection 1,000 mcg  3. Adenocarcinoma of rectum  sodium chloride 0.9 % injection 10 mL, heparin lock flush 500 unit/5 mL, Differential, Comprehensive metabolic panel, Urinalysis, dipstick only, CEA, CBC, Differential, CBC, Differential, Comprehensive metabolic panel, Urinalysis, dipstick only, Urinalysis, dipstick only, CBC, Differential, Comprehensive metabolic panel, CEA    INTERVAL HISTORY: [name] 60 y.o. male returns for  regular  visit for followup of recurrent metastatic rectal cancer.  The patient was seen today to go over his PET scan results. I personally reviewed and went over radiographic studies with the patient.  He understands that he now has 2 hepatic lesions that are new and the pulmonary lesion has grown.  As a result, the patient is not a surgical candidate.  The patient denies any complaints.  We had a discussion about chemotherapy, and it was decided to try chemotherapy.  He understands that his bone marrow may not be able to handle the chemotherapy and thus his blood counts might be adversely effected.  At that point, therapy may need to be discontinued.  He understands this risk.   Past Medical History  Diagnosis Date  . Cancer     Colon ca dx 2008 surg/rad/chemo  . Pancytopenia, acquired 12/04/2010  . B12 deficiency 12/07/2010  . Lung cancer   . Hypertension   . Diabetes mellitus   . Hypercholesteremia     has DIABETES MELLITUS, TYPE II; HYPERCHOLESTEROLEMIA; ANEMIA, CHRONIC; CORONARY ARTERY DISEASE; SLEEP APNEA; Adenocarcinoma of rectum; Pancytopenia, acquired; and B12 deficiency on his problem list.     is allergic to oxaprozin.  [name] had no medications administered during this visit.  Past Surgical History  Procedure Date  . Ileostomy 12/07/2010    Procedure:  ILEOSTOMY;  Surgeon: [name];  Location: [facility];  Service: General;  Laterality: N/A;  Diverting Ileostomy Lysis of adhesions Exploratory Laparotomy  . Lung biopsy     rt lobe  . Cholecystectomy     Denies any headaches, dizziness, double vision, fevers, chills, night sweats, nausea, vomiting, diarrhea, constipation, chest pain, heart palpitations, shortness of breath, blood in stool, black tarry stool, urinary pain, urinary burning, urinary frequency, hematuria.   PHYSICAL EXAMINATION  ECOG PERFORMANCE STATUS: 1 - Symptomatic but completely ambulatory  Filed Vitals:   04/04/11 1348  BP: 128/86  Pulse: 85  Temp: 98.6 F (37 C)    GENERAL:alert, no distress, well nourished, well developed, comfortable and cooperative SKIN: skin color, texture, turgor are normal HEAD: Normocephalic EYES: normal EARS: External ears normal OROPHARYNX:mucous membranes are moist  NECK: trachea midline LYMPH:  not examined BREAST:not examined LUNGS: not examined HEART: not examined ABDOMEN:not examined BACK: not examined EXTREMITIES:not examined  NEURO: alert & oriented x 3 with fluent speech, no focal motor/sensory deficits, gait normal   LABORATORY DATA: CBC    Component Value Date/Time   WBC 4.9 03/02/2011 1500   RBC 4.57 03/02/2011 1500   HGB 12.8* 03/02/2011 1500   HCT 39.3 03/02/2011 1500   PLT 124* 03/02/2011 1500   MCV 86.0 03/02/2011 1500   MCH 28.0 03/02/2011 1500   MCHC 32.6 03/02/2011 1500   RDW 13.7 03/02/2011 1500   LYMPHSABS 0.9 03/02/2011 1500   MONOABS 0.5 03/02/2011 1500   EOSABS 0.1 03/02/2011 1500   BASOSABS 0.0 03/02/2011 1500    RADIOGRAPHIC STUDIES:  03/28/11  *RADIOLOGY REPORT*  Clinical Data: Subsequent treatment strategy for metastatic  colorectal cancer.  NUCLEAR MEDICINE PET CT INITIAL (PI) SKULL BASE TO THIGH  Technique: 19.7 mCi F-18 FDG was injected intravenously via the  left porta catheter. Full-ring PET imaging was performed from the  skull base  through the mid-thighs 70 minutes after injection. CT  data was obtained and used for attenuation correction and anatomic  localization only. (This was not acquired as a diagnostic CT  examination.)  Fasting Blood Glucose: 87  Patient Weight: 199 pounds.  Comparison: 10/13/2010  Findings: No hypermetabolic lymph nodes within the soft tissues of  the neck.  There are no hypermetabolic supraclavicular or axillary lymph  nodes.  No hypermetabolic mediastinal or hilar lymph nodes.  No pericardial or pleural effusion.  Right upper lobe pulmonary nodule measures 1.2 cm and has an SUV  max equal to 6.4. Previously this nodule measured 1 cm and had an  SUV max equal to 3.5.  No additional pulmonary parenchymal nodules or masses identified  within the lungs. Left hepatic lobe tumor measures 3.2 cm and has  an SUV max equal to 8.6, image 120. Previously this measured 2.7  cm and had an SUV max equal to 7.7.  Within the right hepatic lobe there is a hypermetabolic lesion  which measures approximately 3.2 cm and has an SUV max equal to  17.30, image 131. New from previous exam.  The adrenal glands are normal.  Prior cholecystectomy. The pancreas is negative.  Normal appearance of the spleen.  No hypermetabolic adenopathy within the upper abdomen.  There is a right lower quadrant ileostomy.  Bilateral renal calculi identified without evidence for obstructive  uropathy.  At the level of the sigmoid anastomosis there is a small focus of  hypermetabolic tumor. This has an SUV max equal to 12.9, image  199. Previously the SUV max was equal to 8.2.  Relative decreased uptake within the lower lumbar spine likely  reflects changes of external beam radiation.  A small focus of mildly increased radiotracer uptake is identified  within the spinous process of the L2 vertebra. This has an SUV max  equal to 4.1. This is nonspecific and attention on follow-up exam  is recommended.  IMPRESSION:  1.  Evidence of disease progression within the liver.  2. Right upper lobe pulmonary nodule is slightly increased in size  and degree of FDG uptake.  3. Persistent abnormal increased uptake at the sigmoid anastomoses.  The degree of FDG uptake associated at the sigmoid anastomose this  anastomoses has increased in the interval.  Original Report Authenticated By: Rosealee Albee, M.D.         ASSESSMENT:  1. 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   PLAN:  1. Will anticipate chemotherapy next week consisting of FOLFOX with AVASTIN.  We will initiate with a 50% dose reduction of the Oxaliplatin. 2. Lab work on Friday with port flush:  CBC diff, CMET, UA, Ferritin, CEA as baseline. 3. Pre-chemo lab work ordered 4. Anticipate chemotherapy next Monday 04/11/11. 5. Built treatment plan 6. Built Antibody plan 7. Patient will undergo chemotherapy teaching with our Nurse navigator at the end of this week. 8. Patient will return in 1 month for follow-up. 9. Will monitor blood counts closely.   All questions were answered. The patient knows to call the clinic with any problems, questions or concerns. We can certainly see the patient much sooner if necessary.  The patient and plan  discussed with Glenford Peers, MD and he is in agreement with the aforementioned.  Dr. [name] saw and examined the patient.  More than 50% of the time spent with the patient was utilized for counseling and coordination of care.    KEFALAS,THOMAS

## 2011-04-04 NOTE — Patient Instructions (Signed)
[facility]  Discharge Instructions  RECOMMENDATIONS MADE BY THE CONSULTANT AND ANY TEST RESULTS WILL BE SENT TO YOUR REFERRING DOCTOR.   EXAM FINDINGS BY MD TODAY AND SIGNS AND SYMPTOMS TO REPORT TO CLINIC OR PRIMARY MD: You PET scan shows areas of increased disease.  We will get you started on chemotherapy using Oxaliplatin, leucovorin, 5 FU by bolus and continuous infusion.  Rosana Berger will meet with you to talk with you about the therapy.  MEDICATIONS PRESCRIBED: none   INSTRUCTIONS GIVEN AND DISCUSSED:   SPECIAL INSTRUCTIONS/FOLLOW-UP: Lab work Needed on Friday and Return to Clinic on Friday at 10 am to meet with Rolly Salter for teaching, 11/12 for chemotherapy and in about a month to see Tom.   I acknowledge that I have been informed and understand all the instructions given to me and received a copy. I do not have any more questions at this time, but understand that I may call the Specialty Clinic at [facility] at [phone] during business hours should I have any further questions or need assistance in obtaining follow-up care.    __________________________________________  _____________  __________ Signature of Patient or Authorized Representative            Date                   Time    __________________________________________ Nurse's Signature

## 2011-04-06 ENCOUNTER — Ambulatory Visit (HOSPITAL_COMMUNITY): Payer: Managed Care, Other (non HMO)

## 2011-04-07 ENCOUNTER — Other Ambulatory Visit (HOSPITAL_COMMUNITY): Payer: Managed Care, Other (non HMO)

## 2011-04-08 ENCOUNTER — Encounter (HOSPITAL_BASED_OUTPATIENT_CLINIC_OR_DEPARTMENT_OTHER): Payer: Managed Care, Other (non HMO)

## 2011-04-08 ENCOUNTER — Encounter (HOSPITAL_COMMUNITY): Payer: Managed Care, Other (non HMO)

## 2011-04-08 DIAGNOSIS — C20 Malignant neoplasm of rectum: Secondary | ICD-10-CM

## 2011-04-08 LAB — CBC
HCT: 42.1 % (ref 39.0–52.0)
MCHC: 34 g/dL (ref 30.0–36.0)
Platelets: 112 10*3/uL — ABNORMAL LOW (ref 150–400)
RDW: 16.6 % — ABNORMAL HIGH (ref 11.5–15.5)
WBC: 5.2 10*3/uL (ref 4.0–10.5)

## 2011-04-08 LAB — DIFFERENTIAL
Basophils Absolute: 0 10*3/uL (ref 0.0–0.1)
Basophils Relative: 0 % (ref 0–1)
Monocytes Absolute: 0.5 10*3/uL (ref 0.1–1.0)
Neutro Abs: 3.7 10*3/uL (ref 1.7–7.7)

## 2011-04-08 LAB — COMPREHENSIVE METABOLIC PANEL
ALT: 48 U/L (ref 0–53)
AST: 31 U/L (ref 0–37)
Albumin: 4.1 g/dL (ref 3.5–5.2)
Calcium: 9.9 mg/dL (ref 8.4–10.5)
Chloride: 109 mEq/L (ref 96–112)
Creatinine, Ser: 1.19 mg/dL (ref 0.50–1.35)
Sodium: 139 mEq/L (ref 135–145)
Total Bilirubin: 0.9 mg/dL (ref 0.3–1.2)

## 2011-04-08 MED ORDER — HEPARIN SOD (PORK) LOCK FLUSH 100 UNIT/ML IV SOLN
INTRAVENOUS | Status: AC
  Administered 2011-04-08: 500 [IU]
  Filled 2011-04-08: qty 5

## 2011-04-08 MED ORDER — PROCHLORPERAZINE MALEATE 10 MG PO TABS: 10.0000 mg | ORAL_TABLET | Freq: Four times a day (QID) | ORAL | Status: DC | PRN

## 2011-04-08 MED ORDER — SODIUM CHLORIDE 0.9 % IJ SOLN
INTRAMUSCULAR | Status: AC
  Administered 2011-04-08: 10 mL
  Filled 2011-04-08: qty 10

## 2011-04-08 MED ORDER — LORAZEPAM 1 MG PO TABS: 1.0000 mg | ORAL_TABLET | ORAL | Status: AC | PRN

## 2011-04-08 MED ORDER — ONDANSETRON HCL 8 MG PO TABS: ORAL_TABLET | ORAL | Status: DC

## 2011-04-08 MED ORDER — DEXAMETHASONE 4 MG PO TABS: ORAL_TABLET | ORAL | Status: DC

## 2011-04-08 NOTE — Patient Instructions (Signed)
[facility]   CHEMOTHERAPY INSTRUCTIONS Folfox (5FU, Leucovorin, Oxaliplatin) and Avastin   POTENTIAL SIDE EFFECTS OF TREATMENT: Increased Susceptibility to Infection, Vomiting, Hair Thinning, Changes in Character of Skin and Nails (brittleness, dryness,etc.), Pigment Changes (darkening of veins, nail beds, palms of hands, soles of feet, etc.), Bone Marrow Suppression, Abdominal Cramping, Urinary Frequency and Blood in Urine   EDUCATIONAL MATERIALS GIVEN AND REVIEWED: Chemotherapy and You   SELF CARE ACTIVITIES WHILE ON CHEMOTHERAPY: Increase your fluid intake 48 hours prior to treatment and drink at least 2 quarts per day after treatment., No alcohol intake., No aspirin or other medications unless approved by your oncologist., Eat foods that are light and easy to digest., No fried, fatty, or spicy foods immediately before or after treatment., Have teeth cleaned professionally before starting treatment. Keep dentures and partial plates clean., Use soft toothbrush and do not use mouthwashes that contain alcohol. Biotene is a good mouthwash that is available at most pharmacies or may be ordered by calling [phone]., Use warm salt water gargles (1 teaspoon salt per 1 quart warm water) before and after meals and at bedtime. Or you may rinse with 2 tablespoons of three -percent hydrogen peroxide mixed in eight ounces of water., Always use sunscreen with SPF (Sun Protection Factor) of 30 or higher. and Use your nausea medication as directed to prevent nausea.   MEDICATIONS: You have been given prescriptions for the following medications:  Decadron 4mg  tablet. Starting the day after chemo, take 2 tablets in the am daily x 2 days.  Zofran 8mg  tablet. Starting the day after chemo, take 1 tablet in the am and 1 tablet in the pm x 2 days.  Then may take 1 tablet twice a day as needed/if needed for nausea/vomiting.  Ativan 1mg  every 3 to 4 hours as needed for  nausea or vomiting. If you are having trouble sleeping, especially around the time of chemo you may take 1/2 tablet to a whole tablet of Ativan at bedtime to help you sleep. Do not mix this with any other medication to help you sleep. Do not take this with Benadryl.   Compazine 10 mg tablet. Take 1 tablet every 6 hours as needed/if needed for nausea/vomiting.   EMLA cream. Apply a quarter sized glob of cream to port site 1 hour prior to chemo. Do not rub in. Cover with plastic.   Please keep a journal of the times that you take your nausea medicine to ensure that you do not exceed the dosage prescribed on your bottles. If you need the nausea medication - take it!!!! Do not wait until you are actually vomiting to take the nausea/vomiting medication. Always call us for any questions that you may have regarding your chemo treatments, labs, side effects you think you are experiencing, or medications!!!!!  SYMPTOMS TO REPORT AS SOON AS POSSIBLE AFTER TREATMENT:  FEVER GREATER THAN 100.5 F  CHILLS WITH OR WITHOUT FEVER  NAUSEA AND VOMITING THAT IS NOT CONTROLLED WITH YOUR NAUSEA MEDICATION  UNUSUAL SHORTNESS OF BREATH  UNUSUAL BRUISING OR BLEEDING  TENDERNESS IN MOUTH AND THROAT WITH OR WITHOUT PRESENCE OF ULCERS  URINARY PROBLEMS  BOWEL PROBLEMS  UNUSUAL RASH    Wear comfortable clothing and clothing appropriate for easy access to any Portacath or PICC line. Let us know if there is anything that we can do to make your therapy better!   I have been informed and understand all of the instructions given to me  and have received a copy. I have been instructed to call the clinic [phone] or my family physician as soon as possible for continued medical care, if indicated. I do not have any more questions at this time but understand that I may call the Cancer Center or the Patient Navigator at [phone] during office hours should I have questions or need assistance in obtaining  follow-up care.      _________________________________________      _______________     __________ Signature of Patient or Authorized Representative        Date                            Time      _________________________________________ Nurse's Signature

## 2011-04-08 NOTE — Progress Notes (Signed)
[name] presented for Portacath access and flush. Proper placement of portacath confirmed by CXR. Portacath located LT chest wall accessed with  H 20 needle. Good blood return present. Portacath flushed with 20ml NS and 500U/75ml Heparin and needle removed intact. Procedure without incident. Patient tolerated procedure well.  CBC diff, CMET, CEA, ferritin level drawn from port.

## 2011-04-08 NOTE — Progress Notes (Signed)
Chemo teaching done and signed. Consent signed for Folfox and Avastin.

## 2011-04-11 ENCOUNTER — Encounter (HOSPITAL_BASED_OUTPATIENT_CLINIC_OR_DEPARTMENT_OTHER): Payer: Managed Care, Other (non HMO)

## 2011-04-11 ENCOUNTER — Ambulatory Visit (HOSPITAL_COMMUNITY): Payer: Managed Care, Other (non HMO) | Admitting: Oncology

## 2011-04-11 DIAGNOSIS — Z5111 Encounter for antineoplastic chemotherapy: Secondary | ICD-10-CM

## 2011-04-11 DIAGNOSIS — C20 Malignant neoplasm of rectum: Secondary | ICD-10-CM

## 2011-04-11 DIAGNOSIS — C7800 Secondary malignant neoplasm of unspecified lung: Secondary | ICD-10-CM

## 2011-04-11 DIAGNOSIS — C787 Secondary malignant neoplasm of liver and intrahepatic bile duct: Secondary | ICD-10-CM

## 2011-04-11 LAB — URINALYSIS, DIPSTICK ONLY
Glucose, UA: NEGATIVE mg/dL
Ketones, ur: NEGATIVE mg/dL
Leukocytes, UA: NEGATIVE
pH: 5.5 (ref 5.0–8.0)

## 2011-04-11 MED ORDER — LEUCOVORIN CALCIUM INJECTION 100 MG
42.0000 mg | Freq: Once | INTRAMUSCULAR | Status: AC
  Administered 2011-04-11: 42 mg via INTRAVENOUS
  Filled 2011-04-11 (×2): qty 2.1

## 2011-04-11 MED ORDER — FLUOROURACIL CHEMO INJECTION 2.5 GM/50ML
400.0000 mg/m2 | Freq: Once | INTRAVENOUS | Status: AC
  Administered 2011-04-11: 850 mg via INTRAVENOUS
  Filled 2011-04-11 (×2): qty 17

## 2011-04-11 MED ORDER — SODIUM CHLORIDE 0.9 % IV SOLN
Freq: Once | INTRAVENOUS | Status: AC
  Administered 2011-04-11: 8 mg via INTRAVENOUS
  Filled 2011-04-11: qty 4

## 2011-04-11 MED ORDER — SODIUM CHLORIDE 0.9 % IV SOLN
Freq: Once | INTRAVENOUS | Status: AC
  Administered 2011-04-11: 11:00:00 via INTRAVENOUS

## 2011-04-11 MED ORDER — SODIUM CHLORIDE 0.9 % IV SOLN
2400.0000 mg/m2 | INTRAVENOUS | Status: DC
  Administered 2011-04-11: 5050 mg via INTRAVENOUS
  Filled 2011-04-11 (×3): qty 101

## 2011-04-11 MED ORDER — DEXTROSE 5 % IV SOLN
50.0000 mL | INTRAVENOUS | Status: AC
  Administered 2011-04-11: 50 mL via INTRAVENOUS
  Filled 2011-04-11 (×2): qty 50

## 2011-04-11 MED ORDER — DEXAMETHASONE SODIUM PHOSPHATE 10 MG/ML IJ SOLN: 10.0000 mg | Freq: Once | INTRAMUSCULAR | Status: DC

## 2011-04-11 MED ORDER — SODIUM CHLORIDE 0.9 % IV SOLN
5.0000 mg/kg | Freq: Once | INTRAVENOUS | Status: AC
  Administered 2011-04-11: 450 mg via INTRAVENOUS
  Filled 2011-04-11: qty 18

## 2011-04-11 MED ORDER — OXALIPLATIN CHEMO INJECTION 100 MG/20ML
42.5000 mg/m2 | Freq: Once | INTRAVENOUS | Status: AC
  Administered 2011-04-11: 90 mg via INTRAVENOUS
  Filled 2011-04-11 (×2): qty 18

## 2011-04-11 MED ORDER — ONDANSETRON 8 MG/50ML IVPB (CHCC): 8.0000 mg | Freq: Once | INTRAVENOUS | Status: DC

## 2011-04-11 MED ORDER — LEUCOVORIN CALCIUM INJECTION 350 MG: 400.0000 mg/m2 | Freq: Once | INTRAVENOUS | Status: DC

## 2011-04-13 ENCOUNTER — Encounter (HOSPITAL_BASED_OUTPATIENT_CLINIC_OR_DEPARTMENT_OTHER): Payer: Managed Care, Other (non HMO)

## 2011-04-13 ENCOUNTER — Encounter (HOSPITAL_COMMUNITY): Payer: Managed Care, Other (non HMO)

## 2011-04-13 DIAGNOSIS — C20 Malignant neoplasm of rectum: Secondary | ICD-10-CM

## 2011-04-13 DIAGNOSIS — Z452 Encounter for adjustment and management of vascular access device: Secondary | ICD-10-CM

## 2011-04-13 MED ORDER — HEPARIN SOD (PORK) LOCK FLUSH 100 UNIT/ML IV SOLN
500.0000 [IU] | Freq: Once | INTRAVENOUS | Status: AC | PRN
  Administered 2011-04-13: 500 [IU]
  Filled 2011-04-13: qty 5

## 2011-04-13 MED ORDER — SODIUM CHLORIDE 0.9 % IJ SOLN
INTRAMUSCULAR | Status: AC
  Filled 2011-04-13: qty 10

## 2011-04-13 MED ORDER — SODIUM CHLORIDE 0.9 % IJ SOLN
10.0000 mL | INTRAMUSCULAR | Status: DC | PRN
  Administered 2011-04-13: 10 mL
  Filled 2011-04-13: qty 10

## 2011-04-13 MED ORDER — HEPARIN SOD (PORK) LOCK FLUSH 100 UNIT/ML IV SOLN
INTRAVENOUS | Status: AC
  Filled 2011-04-13: qty 5

## 2011-04-13 NOTE — Progress Notes (Signed)
Pt denies any complaints related to chemotherapy.

## 2011-04-14 ENCOUNTER — Other Ambulatory Visit (HOSPITAL_COMMUNITY): Payer: Managed Care, Other (non HMO)

## 2011-04-18 ENCOUNTER — Encounter (HOSPITAL_BASED_OUTPATIENT_CLINIC_OR_DEPARTMENT_OTHER): Payer: Managed Care, Other (non HMO)

## 2011-04-18 ENCOUNTER — Encounter (HOSPITAL_COMMUNITY): Payer: Managed Care, Other (non HMO)

## 2011-04-18 DIAGNOSIS — C20 Malignant neoplasm of rectum: Secondary | ICD-10-CM

## 2011-04-18 LAB — CBC
Platelets: 65 10*3/uL — ABNORMAL LOW (ref 150–400)
RBC: 4.26 MIL/uL (ref 4.22–5.81)
WBC: 3.9 10*3/uL — ABNORMAL LOW (ref 4.0–10.5)

## 2011-04-18 LAB — COMPREHENSIVE METABOLIC PANEL
AST: 21 U/L (ref 0–37)
Albumin: 3.6 g/dL (ref 3.5–5.2)
Alkaline Phosphatase: 78 U/L (ref 39–117)
BUN: 13 mg/dL (ref 6–23)
Chloride: 106 mEq/L (ref 96–112)
Potassium: 3.9 mEq/L (ref 3.5–5.1)
Total Bilirubin: 0.6 mg/dL (ref 0.3–1.2)

## 2011-04-18 LAB — DIFFERENTIAL
Basophils Absolute: 0 10*3/uL (ref 0.0–0.1)
Basophils Relative: 0 % (ref 0–1)
Eosinophils Absolute: 0.2 10*3/uL (ref 0.0–0.7)
Neutrophils Relative %: 68 % (ref 43–77)

## 2011-04-18 MED ORDER — HEPARIN SOD (PORK) LOCK FLUSH 100 UNIT/ML IV SOLN
500.0000 [IU] | Freq: Once | INTRAVENOUS | Status: AC
  Administered 2011-04-18: 500 [IU] via INTRAVENOUS
  Filled 2011-04-18: qty 5

## 2011-04-18 MED ORDER — SODIUM CHLORIDE 0.9 % IJ SOLN
10.0000 mL | INTRAMUSCULAR | Status: DC | PRN
  Administered 2011-04-18: 10 mL via INTRAVENOUS
  Filled 2011-04-18: qty 10

## 2011-04-18 NOTE — Progress Notes (Signed)
Addended by: Laural Benes C on: 04/18/2011 11:37 AM   Modules accepted: Orders

## 2011-04-18 NOTE — Progress Notes (Signed)
Tolerated well.  Good blood return.

## 2011-04-20 ENCOUNTER — Other Ambulatory Visit (HOSPITAL_COMMUNITY): Payer: Managed Care, Other (non HMO)

## 2011-04-20 ENCOUNTER — Encounter (HOSPITAL_BASED_OUTPATIENT_CLINIC_OR_DEPARTMENT_OTHER): Payer: Managed Care, Other (non HMO)

## 2011-04-20 DIAGNOSIS — C20 Malignant neoplasm of rectum: Secondary | ICD-10-CM

## 2011-04-20 LAB — COMPREHENSIVE METABOLIC PANEL
ALT: 45 U/L (ref 0–53)
Calcium: 9.6 mg/dL (ref 8.4–10.5)
Creatinine, Ser: 1.17 mg/dL (ref 0.50–1.35)
GFR calc Af Amer: 78 mL/min — ABNORMAL LOW (ref >90)
Glucose, Bld: 78 mg/dL (ref 70–99)
Sodium: 141 mEq/L (ref 135–145)
Total Protein: 7.2 g/dL (ref 6.0–8.3)

## 2011-04-20 LAB — CBC
HCT: 38.8 % — ABNORMAL LOW (ref 39.0–52.0)
Hemoglobin: 13.2 g/dL (ref 13.0–17.0)
RBC: 4.48 MIL/uL (ref 4.22–5.81)
RDW: 16.2 % — ABNORMAL HIGH (ref 11.5–15.5)
WBC: 4.5 10*3/uL (ref 4.0–10.5)

## 2011-04-20 LAB — DIFFERENTIAL
Basophils Absolute: 0 10*3/uL (ref 0.0–0.1)
Lymphocytes Relative: 15 % (ref 12–46)
Monocytes Absolute: 0.4 10*3/uL (ref 0.1–1.0)
Neutro Abs: 3.3 10*3/uL (ref 1.7–7.7)

## 2011-04-20 MED ORDER — HEPARIN SOD (PORK) LOCK FLUSH 100 UNIT/ML IV SOLN
INTRAVENOUS | Status: AC
  Filled 2011-04-20: qty 5

## 2011-04-20 MED ORDER — SODIUM CHLORIDE 0.9 % IJ SOLN
INTRAMUSCULAR | Status: AC
  Filled 2011-04-20: qty 20

## 2011-04-20 NOTE — Progress Notes (Signed)
[name] presented for Portacath access and flush. Proper placement of portacath confirmed by CXR. Portacath located left chest wall accessed with  H 20 needle. Good blood return present.  Specimen drawn for labs as ordered.   Portacath flushed with 20ml NS and 500U/68ml Heparin and needle removed intact. Procedure without incident. Patient tolerated procedure well.

## 2011-04-25 ENCOUNTER — Encounter (HOSPITAL_BASED_OUTPATIENT_CLINIC_OR_DEPARTMENT_OTHER): Payer: Managed Care, Other (non HMO)

## 2011-04-25 VITALS — BP 116/81 | HR 63 | Temp 97.9°F | Wt 204.0 lb

## 2011-04-25 DIAGNOSIS — C20 Malignant neoplasm of rectum: Secondary | ICD-10-CM

## 2011-04-25 DIAGNOSIS — Z5111 Encounter for antineoplastic chemotherapy: Secondary | ICD-10-CM

## 2011-04-25 LAB — DIFFERENTIAL
Lymphocytes Relative: 15 % (ref 12–46)
Lymphs Abs: 0.7 10*3/uL (ref 0.7–4.0)
Monocytes Absolute: 0.5 10*3/uL (ref 0.1–1.0)
Monocytes Relative: 12 % (ref 3–12)
Neutro Abs: 3.2 10*3/uL (ref 1.7–7.7)
Neutrophils Relative %: 71 % (ref 43–77)

## 2011-04-25 LAB — CBC
HCT: 41.2 % (ref 39.0–52.0)
Hemoglobin: 13.8 g/dL (ref 13.0–17.0)
MCHC: 33.5 g/dL (ref 30.0–36.0)
RBC: 4.71 MIL/uL (ref 4.22–5.81)
WBC: 4.5 10*3/uL (ref 4.0–10.5)

## 2011-04-25 MED ORDER — FLUOROURACIL CHEMO INJECTION 2.5 GM/50ML
400.0000 mg/m2 | Freq: Once | INTRAVENOUS | Status: AC
  Administered 2011-04-25: 850 mg via INTRAVENOUS
  Filled 2011-04-25: qty 17

## 2011-04-25 MED ORDER — SODIUM CHLORIDE 0.9 % IV SOLN
INTRAVENOUS | Status: DC
  Administered 2011-04-25: 11:00:00 via INTRAVENOUS

## 2011-04-25 MED ORDER — HEPARIN SOD (PORK) LOCK FLUSH 100 UNIT/ML IV SOLN
500.0000 [IU] | Freq: Once | INTRAVENOUS | Status: DC | PRN
  Filled 2011-04-25: qty 5

## 2011-04-25 MED ORDER — DEXTROSE 5 % IV SOLN
50.0000 mL | INTRAVENOUS | Status: AC
  Filled 2011-04-25 (×2): qty 50

## 2011-04-25 MED ORDER — SODIUM CHLORIDE 0.9 % IV SOLN
1800.0000 mg/m2 | INTRAVENOUS | Status: DC
  Administered 2011-04-25: 3800 mg via INTRAVENOUS
  Filled 2011-04-25 (×2): qty 76

## 2011-04-25 MED ORDER — LEUCOVORIN CALCIUM INJECTION 100 MG
20.0000 mg/m2 | Freq: Once | INTRAMUSCULAR | Status: AC
  Administered 2011-04-25: 42 mg via INTRAVENOUS
  Filled 2011-04-25: qty 2.1

## 2011-04-25 MED ORDER — OXALIPLATIN CHEMO INJECTION 100 MG/20ML
42.5000 mg/m2 | Freq: Once | INTRAVENOUS | Status: AC
  Administered 2011-04-25: 90 mg via INTRAVENOUS
  Filled 2011-04-25: qty 18

## 2011-04-25 MED ORDER — SODIUM CHLORIDE 0.9 % IV SOLN: 8.0000 mg | Freq: Once | INTRAVENOUS | Status: DC

## 2011-04-25 MED ORDER — DEXTROSE 5 % IV SOLN
Freq: Once | INTRAVENOUS | Status: DC
  Administered 2011-04-25: 12:00:00 via INTRAVENOUS
  Filled 2011-04-25: qty 1000

## 2011-04-25 MED ORDER — SODIUM CHLORIDE 0.9 % IJ SOLN
10.0000 mL | INTRAMUSCULAR | Status: DC | PRN
  Filled 2011-04-25: qty 10

## 2011-04-25 MED ORDER — LEUCOVORIN CALCIUM INJECTION 350 MG: 400.0000 mg/m2 | Freq: Once | INTRAVENOUS | Status: DC

## 2011-04-25 MED ORDER — DEXAMETHASONE SODIUM PHOSPHATE 10 MG/ML IJ SOLN: 10.0000 mg | Freq: Once | INTRAMUSCULAR | Status: DC

## 2011-04-25 MED ORDER — SODIUM CHLORIDE 0.9 % IV SOLN
Freq: Once | INTRAVENOUS | Status: AC
  Administered 2011-04-25: 8 mg via INTRAVENOUS
  Filled 2011-04-25: qty 4

## 2011-04-25 NOTE — Progress Notes (Signed)
Infusion complete, patient tolerated well.  CI pump infusing as ordered.

## 2011-04-27 ENCOUNTER — Encounter (HOSPITAL_BASED_OUTPATIENT_CLINIC_OR_DEPARTMENT_OTHER): Payer: Managed Care, Other (non HMO)

## 2011-04-27 DIAGNOSIS — E538 Deficiency of other specified B group vitamins: Secondary | ICD-10-CM

## 2011-04-27 DIAGNOSIS — C189 Malignant neoplasm of colon, unspecified: Secondary | ICD-10-CM

## 2011-04-27 MED ORDER — HEPARIN SOD (PORK) LOCK FLUSH 100 UNIT/ML IV SOLN
INTRAVENOUS | Status: AC
  Filled 2011-04-27: qty 5

## 2011-04-27 MED ORDER — HEPARIN SOD (PORK) LOCK FLUSH 100 UNIT/ML IV SOLN
500.0000 [IU] | Freq: Once | INTRAVENOUS | Status: AC
  Administered 2011-04-27: 500 [IU] via INTRAVENOUS
  Filled 2011-04-27: qty 5

## 2011-04-27 MED ORDER — SODIUM CHLORIDE 0.9 % IJ SOLN
INTRAMUSCULAR | Status: AC
  Filled 2011-04-27: qty 20

## 2011-04-27 MED ORDER — CYANOCOBALAMIN 1000 MCG/ML IJ SOLN
1000.0000 ug | Freq: Once | INTRAMUSCULAR | Status: AC
  Administered 2011-04-27: 1000 ug via INTRAMUSCULAR

## 2011-04-27 MED ORDER — CYANOCOBALAMIN 1000 MCG/ML IJ SOLN
INTRAMUSCULAR | Status: AC
  Filled 2011-04-27: qty 1

## 2011-04-27 MED ORDER — SODIUM CHLORIDE 0.9 % IJ SOLN
10.0000 mL | INTRAMUSCULAR | Status: DC | PRN
  Administered 2011-04-27: 10 mL via INTRAVENOUS
  Filled 2011-04-27: qty 10

## 2011-04-27 NOTE — Progress Notes (Signed)
CI pump removed.  Port flushed and removed intact.  Patient tolerated well.

## 2011-04-28 ENCOUNTER — Ambulatory Visit (HOSPITAL_COMMUNITY): Payer: Managed Care, Other (non HMO)

## 2011-05-02 ENCOUNTER — Encounter (HOSPITAL_COMMUNITY): Payer: Managed Care, Other (non HMO) | Attending: Oncology | Admitting: Oncology

## 2011-05-02 VITALS — BP 126/67 | HR 65 | Temp 97.0°F | Wt 203.2 lb

## 2011-05-02 DIAGNOSIS — C20 Malignant neoplasm of rectum: Secondary | ICD-10-CM

## 2011-05-02 NOTE — Progress Notes (Signed)
[name], MD [address] [place] [address]  1. Adenocarcinoma of rectum  CBC, Differential, Urinalysis, dipstick only, CBC, Differential, CBC, Differential, Urinalysis, dipstick only, Comprehensive metabolic panel    CURRENT THERAPY:S/P 2 Cycles of FOLFOX+Avastin chemotherapy. Oxaliplatin was reduced by 50% and 5 FU continuous infusion is reduced by 75%.  INTERVAL HISTORY: [name] 60 y.o. male returns for  regular  visit for followup of recurrent metastatic rectal cancer  The patient is tolerating chemotherapy well. He has a question regarding side effects secondary to liver problems from his metastatic disease. He asks what signs and symptoms may arise in the liver becomes worse. I explained to him that he may get a feeling of discomfort, pain, or fullness in the right upper quadrant. He may also have referred her pain as well. He may experience some yellowing of the skin or jaundice.  The patient denies any complaints at this point time. He denies any nausea, vomiting, diarrhea, or constipation. His colostomy is producing stool as anticipated. He is pleased that he is handling chemotherapy well. He questions whether chemotherapy will be held if his platelet count is low. I spent him that he will likely be held if his platelet count is not recovering follow chemotherapy. Patient already has a significant dose reduction of the oxaliplatin and the continuous 5-FU infusion.  I don't believe that any further dose reduction would be beneficial for the patient.   Past Medical History  Diagnosis Date  . Cancer     Colon ca dx 2008 surg/rad/chemo  . Pancytopenia, acquired 12/04/2010  . B12 deficiency 12/07/2010  . Lung cancer   . Hypertension   . Diabetes mellitus   . Hypercholesteremia     has DIABETES MELLITUS, TYPE II; HYPERCHOLESTEROLEMIA; ANEMIA, CHRONIC; CORONARY ARTERY DISEASE; SLEEP APNEA; Adenocarcinoma of rectum; Pancytopenia, acquired; and  B12 deficiency on his problem list.     is allergic to oxaprozin.  [name] does not currently have medications on file.  Past Surgical History  Procedure Date  . Ileostomy 12/07/2010    Procedure: ILEOSTOMY;  Surgeon: [name];  Location: [facility];  Service: General;  Laterality: N/A;  Diverting Ileostomy Lysis of adhesions Exploratory Laparotomy  . Lung biopsy     rt lobe  . Cholecystectomy     Denies any headaches, dizziness, double vision, fevers, chills, night sweats, nausea, vomiting, diarrhea, constipation, chest pain, heart palpitations, shortness of breath, blood in stool, black tarry stool, urinary pain, urinary burning, urinary frequency, hematuria.   PHYSICAL EXAMINATION  ECOG PERFORMANCE STATUS: 1 - Symptomatic but completely ambulatory  Filed Vitals:   05/02/11 1151  BP: 126/67  Pulse: 65  Temp: 97 F (36.1 C)    GENERAL:alert, no distress, well nourished, well developed, comfortable, cooperative and smiling SKIN: skin color, texture, turgor are normal HEAD: Normocephalic EYES: normal EARS: External ears normal OROPHARYNX:mucous membranes are moist  NECK: supple, no adenopathy, no bruits, thyroid normal size, non-tender, without nodularity, no stridor, non-tender, trachea midline LYMPH:  no palpable lymphadenopathy BREAST:not examined LUNGS: clear to auscultation and percussion HEART: regular rate & rhythm, no murmurs, no gallops, S1 normal and S2 normal ABDOMEN:abdomen soft, non-tender, normal bowel sounds and colostomy noted producing stool. BACK: Back symmetric, no curvature., No CVA tenderness EXTREMITIES:less then 2 second capillary refill, no joint deformities, effusion, or inflammation, no edema, no skin discoloration, no clubbing, no cyanosis  NEURO: alert & oriented x 3 with fluent speech, no focal motor/sensory deficits,  gait normal   LABORATORY DATA: CBC    Component Value Date/Time   WBC 4.5 04/25/2011 1015   RBC 4.71 04/25/2011 1015     HGB 13.8 04/25/2011 1015   HCT 41.2 04/25/2011 1015   PLT 70* 04/25/2011 1015   MCV 87.5 04/25/2011 1015   MCH 29.3 04/25/2011 1015   MCHC 33.5 04/25/2011 1015   RDW 17.2* 04/25/2011 1015   LYMPHSABS 0.7 04/25/2011 1015   MONOABS 0.5 04/25/2011 1015   EOSABS 0.1 04/25/2011 1015   BASOSABS 0.0 04/25/2011 1015      ASSESSMENT:  1. 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 Thrombocytopenia, secondary to chemotherapy administration. Will follow closely.  PLAN:  1. Pre-chemo lab work ordered including UA 2. Continue with Chemotherapy FOLFOX with AVASTIN as scheduled 3. Patient education regarding liver problems from metastatic disease 4. I personally reviewed and went over laboratory results with the patient. 5. Return in 1 month for follow-up.   All questions were answered. The patient knows to call the clinic with any problems, questions or concerns. We can certainly see the patient much sooner if necessary.   Eavan Gonterman

## 2011-05-02 NOTE — Patient Instructions (Signed)
[name]  [id number] [birth year]  [facility]  Discharge Instructions  RECOMMENDATIONS MADE BY THE CONSULTANT AND ANY TEST RESULTS WILL BE SENT TO YOUR REFERRING DOCTOR.   EXAM FINDINGS BY MD TODAY AND SIGNS AND SYMPTOMS TO REPORT TO CLINIC OR PRIMARY MD: you seem to be doing well  MEDICATIONS PRESCRIBED: none   INSTRUCTIONS GIVEN AND DISCUSSED: Other :  Report uncontrolled pain, nausea, or shortness of breath or other problems   SPECIAL INSTRUCTIONS/FOLLOW-UP: Lab work Needed on Friday and Return to Clinic on as scheduled.   I acknowledge that I have been informed and understand all the instructions given to me and received a copy. I do not have any more questions at this time, but understand that I may call the Specialty Clinic at [facility] at [phone] during business hours should I have any further questions or need assistance in obtaining follow-up care.    __________________________________________  _____________  __________ Signature of Patient or Authorized Representative            Date                   Time    __________________________________________ Nurse's Signature

## 2011-05-06 ENCOUNTER — Encounter (HOSPITAL_COMMUNITY): Payer: Managed Care, Other (non HMO)

## 2011-05-06 ENCOUNTER — Encounter (HOSPITAL_BASED_OUTPATIENT_CLINIC_OR_DEPARTMENT_OTHER): Payer: Managed Care, Other (non HMO)

## 2011-05-06 DIAGNOSIS — C20 Malignant neoplasm of rectum: Secondary | ICD-10-CM

## 2011-05-06 LAB — URINALYSIS, DIPSTICK ONLY
Nitrite: NEGATIVE
Specific Gravity, Urine: 1.03 (ref 1.005–1.030)
Urobilinogen, UA: 0.2 mg/dL (ref 0.0–1.0)

## 2011-05-06 LAB — DIFFERENTIAL
Lymphocytes Relative: 23 % (ref 12–46)
Lymphs Abs: 0.7 10*3/uL (ref 0.7–4.0)
Neutrophils Relative %: 58 % (ref 43–77)

## 2011-05-06 LAB — CBC
MCV: 87.7 fL (ref 78.0–100.0)
Platelets: 103 10*3/uL — ABNORMAL LOW (ref 150–400)
RBC: 4.7 MIL/uL (ref 4.22–5.81)
WBC: 2.9 10*3/uL — ABNORMAL LOW (ref 4.0–10.5)

## 2011-05-06 MED ORDER — HEPARIN SOD (PORK) LOCK FLUSH 100 UNIT/ML IV SOLN
INTRAVENOUS | Status: AC
  Filled 2011-05-06: qty 5

## 2011-05-06 MED ORDER — HEPARIN SOD (PORK) LOCK FLUSH 100 UNIT/ML IV SOLN
500.0000 [IU] | Freq: Once | INTRAVENOUS | Status: AC
  Administered 2011-05-06: 500 [IU] via INTRAVENOUS
  Filled 2011-05-06: qty 5

## 2011-05-06 MED ORDER — SODIUM CHLORIDE 0.9 % IJ SOLN
10.0000 mL | INTRAMUSCULAR | Status: DC | PRN
  Administered 2011-05-06: 10 mL via INTRAVENOUS
  Filled 2011-05-06: qty 10

## 2011-05-06 MED ORDER — SODIUM CHLORIDE 0.9 % IJ SOLN
INTRAMUSCULAR | Status: AC
  Filled 2011-05-06: qty 10

## 2011-05-06 NOTE — Progress Notes (Signed)
[name] presented for Portacath access and flush. Proper placement of portacath confirmed by CXR. Portacath located left chest wall accessed with  H 20 needle. Good blood return present. Portacath flushed with 20ml NS and 500U/5ml Heparin and needle removed intact. Procedure without incident. Patient tolerated procedure well.   

## 2011-05-09 ENCOUNTER — Encounter (HOSPITAL_COMMUNITY): Payer: Managed Care, Other (non HMO)

## 2011-05-09 ENCOUNTER — Encounter (HOSPITAL_BASED_OUTPATIENT_CLINIC_OR_DEPARTMENT_OTHER): Payer: Managed Care, Other (non HMO)

## 2011-05-09 VITALS — BP 103/67 | HR 59 | Temp 98.1°F | Wt 200.4 lb

## 2011-05-09 DIAGNOSIS — Z5111 Encounter for antineoplastic chemotherapy: Secondary | ICD-10-CM

## 2011-05-09 DIAGNOSIS — C20 Malignant neoplasm of rectum: Secondary | ICD-10-CM

## 2011-05-09 DIAGNOSIS — Z5112 Encounter for antineoplastic immunotherapy: Secondary | ICD-10-CM

## 2011-05-09 MED ORDER — DEXAMETHASONE SODIUM PHOSPHATE 10 MG/ML IJ SOLN: 10.0000 mg | Freq: Once | INTRAMUSCULAR | Status: DC

## 2011-05-09 MED ORDER — SODIUM CHLORIDE 0.9 % IV SOLN: 8.0000 mg | Freq: Once | INTRAVENOUS | Status: DC

## 2011-05-09 MED ORDER — HEPARIN SOD (PORK) LOCK FLUSH 100 UNIT/ML IV SOLN
500.0000 [IU] | Freq: Once | INTRAVENOUS | Status: DC | PRN
  Filled 2011-05-09: qty 5

## 2011-05-09 MED ORDER — SODIUM CHLORIDE 0.9 % IV SOLN
INTRAVENOUS | Status: DC
  Administered 2011-05-09: 10:00:00 via INTRAVENOUS

## 2011-05-09 MED ORDER — SODIUM CHLORIDE 0.9 % IV SOLN
Freq: Once | INTRAVENOUS | Status: AC
  Administered 2011-05-09: 8 mg via INTRAVENOUS
  Filled 2011-05-09: qty 4

## 2011-05-09 MED ORDER — FLUOROURACIL CHEMO INJECTION 2.5 GM/50ML
400.0000 mg/m2 | Freq: Once | INTRAVENOUS | Status: AC
  Administered 2011-05-09: 850 mg via INTRAVENOUS
  Filled 2011-05-09 (×2): qty 17

## 2011-05-09 MED ORDER — DEXTROSE 5 % IV SOLN
Freq: Once | INTRAVENOUS | Status: AC
  Administered 2011-05-09: 11:00:00 via INTRAVENOUS
  Filled 2011-05-09: qty 1000

## 2011-05-09 MED ORDER — SODIUM CHLORIDE 0.9 % IV SOLN
5.0000 mg/kg | Freq: Once | INTRAVENOUS | Status: AC
  Administered 2011-05-09: 450 mg via INTRAVENOUS
  Filled 2011-05-09 (×2): qty 18

## 2011-05-09 MED ORDER — OXALIPLATIN CHEMO INJECTION 100 MG/20ML
42.5000 mg/m2 | Freq: Once | INTRAVENOUS | Status: AC
  Administered 2011-05-09: 90 mg via INTRAVENOUS
  Filled 2011-05-09 (×2): qty 18

## 2011-05-09 MED ORDER — LEUCOVORIN CALCIUM INJECTION 350 MG: 400.0000 mg/m2 | Freq: Once | INTRAMUSCULAR | Status: DC

## 2011-05-09 MED ORDER — SODIUM CHLORIDE 0.9 % IV SOLN
1800.0000 mg/m2 | INTRAVENOUS | Status: DC
  Administered 2011-05-09: 3800 mg via INTRAVENOUS
  Filled 2011-05-09 (×2): qty 76

## 2011-05-09 MED ORDER — LEUCOVORIN CALCIUM INJECTION 100 MG
42.0000 mg | Freq: Once | INTRAMUSCULAR | Status: AC
  Administered 2011-05-09: 42 mg via INTRAVENOUS
  Filled 2011-05-09: qty 2.1

## 2011-05-09 NOTE — Progress Notes (Signed)
Tolerated chemo well. To return Wednesday at 1 pm,

## 2011-05-11 ENCOUNTER — Encounter (HOSPITAL_BASED_OUTPATIENT_CLINIC_OR_DEPARTMENT_OTHER): Payer: Managed Care, Other (non HMO)

## 2011-05-11 DIAGNOSIS — Z452 Encounter for adjustment and management of vascular access device: Secondary | ICD-10-CM

## 2011-05-11 DIAGNOSIS — C20 Malignant neoplasm of rectum: Secondary | ICD-10-CM

## 2011-05-11 MED ORDER — HEPARIN SOD (PORK) LOCK FLUSH 100 UNIT/ML IV SOLN
500.0000 [IU] | Freq: Once | INTRAVENOUS | Status: AC | PRN
  Administered 2011-05-11: 500 [IU]
  Filled 2011-05-11: qty 5

## 2011-05-11 MED ORDER — SODIUM CHLORIDE 0.9 % IJ SOLN
10.0000 mL | INTRAMUSCULAR | Status: DC | PRN
  Administered 2011-05-11: 10 mL
  Filled 2011-05-11: qty 10

## 2011-05-11 MED ORDER — HEPARIN SOD (PORK) LOCK FLUSH 100 UNIT/ML IV SOLN
INTRAVENOUS | Status: AC
  Filled 2011-05-11: qty 5

## 2011-05-11 MED ORDER — SODIUM CHLORIDE 0.9 % IJ SOLN
INTRAMUSCULAR | Status: AC
  Filled 2011-05-11: qty 10

## 2011-05-11 NOTE — Progress Notes (Signed)
[name] presented for Portacath access and flush. Proper placement of portacath confirmed by CXR. Portacath located left chest wall already accessed with  H 20 needle. Good blood return present. Portacath flushed with 20ml NS and 500U/25ml Heparin and needle removed intact. Procedure without incident. Patient tolerated procedure well.

## 2011-05-13 ENCOUNTER — Telehealth (HOSPITAL_COMMUNITY): Payer: Self-pay | Admitting: *Deleted

## 2011-05-13 NOTE — Telephone Encounter (Signed)
Left message

## 2011-05-20 ENCOUNTER — Encounter (HOSPITAL_COMMUNITY): Payer: Managed Care, Other (non HMO)

## 2011-05-20 ENCOUNTER — Other Ambulatory Visit (HOSPITAL_COMMUNITY): Payer: Self-pay | Admitting: Oncology

## 2011-05-20 ENCOUNTER — Encounter (HOSPITAL_BASED_OUTPATIENT_CLINIC_OR_DEPARTMENT_OTHER): Payer: Managed Care, Other (non HMO)

## 2011-05-20 DIAGNOSIS — C20 Malignant neoplasm of rectum: Secondary | ICD-10-CM

## 2011-05-20 LAB — URINALYSIS, DIPSTICK ONLY
Bilirubin Urine: NEGATIVE
Ketones, ur: NEGATIVE mg/dL
Nitrite: NEGATIVE
Specific Gravity, Urine: >1.03 — ABNORMAL HIGH (ref 1.005–1.030)
Urobilinogen, UA: 0.2 mg/dL (ref 0.0–1.0)

## 2011-05-20 LAB — CBC
HCT: 41.2 % (ref 39.0–52.0)
Hemoglobin: 13.8 g/dL (ref 13.0–17.0)
MCH: 30 pg (ref 26.0–34.0)
MCV: 89.6 fL (ref 78.0–100.0)
Platelets: 68 10*3/uL — ABNORMAL LOW (ref 150–400)
RBC: 4.6 MIL/uL (ref 4.22–5.81)
WBC: 4 10*3/uL (ref 4.0–10.5)

## 2011-05-20 LAB — DIFFERENTIAL
Eosinophils Absolute: 0 10*3/uL (ref 0.0–0.7)
Eosinophils Relative: 1 % (ref 0–5)
Lymphocytes Relative: 11 % — ABNORMAL LOW (ref 12–46)
Lymphs Abs: 0.5 10*3/uL — ABNORMAL LOW (ref 0.7–4.0)
Monocytes Absolute: 0.8 10*3/uL (ref 0.1–1.0)
Monocytes Relative: 21 % — ABNORMAL HIGH (ref 3–12)

## 2011-05-20 MED ORDER — SODIUM CHLORIDE 0.9 % IJ SOLN
10.0000 mL | INTRAMUSCULAR | Status: DC | PRN
  Administered 2011-05-20: 10 mL via INTRAVENOUS
  Filled 2011-05-20: qty 10

## 2011-05-20 MED ORDER — HEPARIN SOD (PORK) LOCK FLUSH 100 UNIT/ML IV SOLN
500.0000 [IU] | Freq: Once | INTRAVENOUS | Status: AC
  Administered 2011-05-20: 500 [IU] via INTRAVENOUS
  Filled 2011-05-20: qty 5

## 2011-05-20 MED ORDER — SODIUM CHLORIDE 0.9 % IJ SOLN
INTRAMUSCULAR | Status: AC
  Filled 2011-05-20: qty 10

## 2011-05-20 MED ORDER — HEPARIN SOD (PORK) LOCK FLUSH 100 UNIT/ML IV SOLN
INTRAVENOUS | Status: AC
  Filled 2011-05-20: qty 5

## 2011-05-20 NOTE — Progress Notes (Signed)
Pt complains of "kidney stone" and requesting something for pain.  Per Dr. [name] prescription for Tramadol 50 mg called into [facility] and information left on Physician's voice mail.

## 2011-05-20 NOTE — Progress Notes (Signed)
Tolerated port flush well. 

## 2011-05-27 ENCOUNTER — Encounter (HOSPITAL_BASED_OUTPATIENT_CLINIC_OR_DEPARTMENT_OTHER): Payer: Managed Care, Other (non HMO)

## 2011-05-27 ENCOUNTER — Other Ambulatory Visit (HOSPITAL_COMMUNITY): Payer: Managed Care, Other (non HMO)

## 2011-05-27 ENCOUNTER — Telehealth (HOSPITAL_COMMUNITY): Payer: Self-pay | Admitting: Oncology

## 2011-05-27 DIAGNOSIS — Z452 Encounter for adjustment and management of vascular access device: Secondary | ICD-10-CM

## 2011-05-27 DIAGNOSIS — C20 Malignant neoplasm of rectum: Secondary | ICD-10-CM

## 2011-05-27 LAB — COMPREHENSIVE METABOLIC PANEL
AST: 21 U/L (ref 0–37)
Albumin: 3.4 g/dL — ABNORMAL LOW (ref 3.5–5.2)
Calcium: 9.7 mg/dL (ref 8.4–10.5)
Creatinine, Ser: 1.19 mg/dL (ref 0.50–1.35)

## 2011-05-27 LAB — CBC
HCT: 41.3 % (ref 39.0–52.0)
MCHC: 32.9 g/dL (ref 30.0–36.0)
MCV: 90.6 fL (ref 78.0–100.0)
RDW: 16.2 % — ABNORMAL HIGH (ref 11.5–15.5)

## 2011-05-27 LAB — DIFFERENTIAL
Basophils Absolute: 0 10*3/uL (ref 0.0–0.1)
Basophils Relative: 0 % (ref 0–1)
Eosinophils Relative: 2 % (ref 0–5)
Monocytes Absolute: 0.4 10*3/uL (ref 0.1–1.0)
Neutro Abs: 1.6 10*3/uL — ABNORMAL LOW (ref 1.7–7.7)

## 2011-05-27 MED ORDER — HEPARIN SOD (PORK) LOCK FLUSH 100 UNIT/ML IV SOLN
500.0000 [IU] | Freq: Once | INTRAVENOUS | Status: AC
  Administered 2011-05-27: 500 [IU] via INTRAVENOUS

## 2011-05-27 MED ORDER — SODIUM CHLORIDE 0.9 % IJ SOLN
INTRAMUSCULAR | Status: AC
  Administered 2011-05-27: 10 mL
  Filled 2011-05-27: qty 10

## 2011-05-27 NOTE — Progress Notes (Signed)
Addended by: Oda Kilts on: 05/27/2011 09:31 AM   Modules accepted: Orders

## 2011-05-27 NOTE — Telephone Encounter (Signed)
Per Dr. [name], patient should still come in on Monday and have his CBC repeated.  If CBC is greater/equal to than 80,000 he can be treated.

## 2011-05-27 NOTE — Progress Notes (Signed)
Cedric Fishman presented for Portacath access and flush. Proper placement of portacath confirmed by CXR. Portacath located lt chest wall accessed with  H 20 needle. Good blood return present. Portacath flushed with 20ml NS and 500U/66ml Heparin and needle removed intact. Procedure without incident. Patient tolerated procedure well.

## 2011-05-30 ENCOUNTER — Encounter (HOSPITAL_BASED_OUTPATIENT_CLINIC_OR_DEPARTMENT_OTHER): Payer: Managed Care, Other (non HMO)

## 2011-05-30 ENCOUNTER — Telehealth (HOSPITAL_COMMUNITY): Payer: Self-pay | Admitting: Oncology

## 2011-05-30 DIAGNOSIS — C20 Malignant neoplasm of rectum: Secondary | ICD-10-CM

## 2011-05-30 LAB — DIFFERENTIAL
Eosinophils Relative: 2 % (ref 0–5)
Lymphocytes Relative: 27 % (ref 12–46)
Lymphs Abs: 0.7 10*3/uL (ref 0.7–4.0)
Monocytes Absolute: 0.4 10*3/uL (ref 0.1–1.0)

## 2011-05-30 LAB — CBC
HCT: 40.6 % (ref 39.0–52.0)
Hemoglobin: 13.4 g/dL (ref 13.0–17.0)
MCHC: 33 g/dL (ref 30.0–36.0)

## 2011-05-30 LAB — URINALYSIS, DIPSTICK ONLY
Bilirubin Urine: NEGATIVE
Glucose, UA: NEGATIVE mg/dL
Ketones, ur: NEGATIVE mg/dL
Nitrite: NEGATIVE
pH: 5.5 (ref 5.0–8.0)

## 2011-05-30 MED ORDER — SODIUM CHLORIDE 0.9 % IJ SOLN
INTRAMUSCULAR | Status: AC
  Administered 2011-05-30: 10 mL via INTRAVENOUS
  Filled 2011-05-30: qty 10

## 2011-05-30 MED ORDER — HEPARIN SOD (PORK) LOCK FLUSH 100 UNIT/ML IV SOLN
500.0000 [IU] | Freq: Once | INTRAVENOUS | Status: AC
  Administered 2011-05-30: 500 [IU] via INTRAVENOUS
  Filled 2011-05-30: qty 5

## 2011-05-30 MED ORDER — SODIUM CHLORIDE 0.9 % IJ SOLN
10.0000 mL | INTRAMUSCULAR | Status: DC | PRN
  Administered 2011-05-30: 10 mL via INTRAVENOUS
  Filled 2011-05-30: qty 10

## 2011-05-30 NOTE — Progress Notes (Signed)
Pt's chemo rescheduled for Wednesday 06/01/11

## 2011-06-01 ENCOUNTER — Other Ambulatory Visit (HOSPITAL_COMMUNITY): Payer: Self-pay | Admitting: Oncology

## 2011-06-01 ENCOUNTER — Encounter (HOSPITAL_COMMUNITY): Payer: Managed Care, Other (non HMO)

## 2011-06-01 ENCOUNTER — Encounter (HOSPITAL_COMMUNITY): Payer: Managed Care, Other (non HMO) | Attending: Oncology

## 2011-06-01 VITALS — BP 133/91 | HR 61 | Temp 97.8°F | Ht 70.0 in | Wt 209.0 lb

## 2011-06-01 DIAGNOSIS — Z5111 Encounter for antineoplastic chemotherapy: Secondary | ICD-10-CM

## 2011-06-01 DIAGNOSIS — C20 Malignant neoplasm of rectum: Secondary | ICD-10-CM

## 2011-06-01 DIAGNOSIS — E538 Deficiency of other specified B group vitamins: Secondary | ICD-10-CM | POA: Insufficient documentation

## 2011-06-01 MED ORDER — DEXTROSE 5 % IV SOLN
50.0000 mL | INTRAVENOUS | Status: AC
  Administered 2011-06-01: 50 mL via INTRAVENOUS
  Filled 2011-06-01 (×2): qty 50

## 2011-06-01 MED ORDER — SODIUM CHLORIDE 0.9 % IV SOLN
Freq: Once | INTRAVENOUS | Status: AC
  Administered 2011-06-01: 09:00:00 via INTRAVENOUS

## 2011-06-01 MED ORDER — DEXTROSE 5 % IV SOLN
42.5000 mg/m2 | Freq: Once | INTRAVENOUS | Status: AC
  Administered 2011-06-01: 90 mg via INTRAVENOUS
  Filled 2011-06-01: qty 18

## 2011-06-01 MED ORDER — LEUCOVORIN CALCIUM INJECTION 350 MG: 400.0000 mg/m2 | Freq: Once | INTRAVENOUS | Status: DC

## 2011-06-01 MED ORDER — FLUOROURACIL CHEMO INJECTION 2.5 GM/50ML
400.0000 mg/m2 | Freq: Once | INTRAVENOUS | Status: AC
  Administered 2011-06-01: 850 mg via INTRAVENOUS
  Filled 2011-06-01: qty 17

## 2011-06-01 MED ORDER — SODIUM CHLORIDE 0.9 % IV SOLN
1800.0000 mg/m2 | INTRAVENOUS | Status: DC
  Administered 2011-06-01: 3800 mg via INTRAVENOUS
  Filled 2011-06-01 (×2): qty 76

## 2011-06-01 MED ORDER — ONDANSETRON HCL 4 MG/2ML IJ SOLN
Freq: Once | INTRAMUSCULAR | Status: AC
  Administered 2011-06-01: 8 mg via INTRAVENOUS
  Filled 2011-06-01: qty 4

## 2011-06-01 MED ORDER — DEXAMETHASONE SODIUM PHOSPHATE 10 MG/ML IJ SOLN: 10.0000 mg | Freq: Once | INTRAMUSCULAR | Status: DC

## 2011-06-01 MED ORDER — SODIUM CHLORIDE 0.9 % IJ SOLN
10.0000 mL | INTRAMUSCULAR | Status: DC | PRN
  Administered 2011-06-01: 10 mL
  Filled 2011-06-01: qty 10

## 2011-06-01 MED ORDER — LEUCOVORIN CALCIUM INJECTION 100 MG
20.0000 mg/m2 | Freq: Once | INTRAMUSCULAR | Status: AC
  Administered 2011-06-01: 44 mg via INTRAVENOUS
  Filled 2011-06-01: qty 2.2

## 2011-06-01 MED ORDER — SODIUM CHLORIDE 0.9 % IV SOLN: 8.0000 mg | Freq: Once | INTRAVENOUS | Status: DC

## 2011-06-01 MED ORDER — SODIUM CHLORIDE 0.9 % IV SOLN
5.0000 mg/kg | Freq: Once | INTRAVENOUS | Status: AC
  Administered 2011-06-01: 450 mg via INTRAVENOUS
  Filled 2011-06-01: qty 18

## 2011-06-01 MED ORDER — HEPARIN SOD (PORK) LOCK FLUSH 100 UNIT/ML IV SOLN
500.0000 [IU] | Freq: Once | INTRAVENOUS | Status: DC | PRN
  Filled 2011-06-01: qty 5

## 2011-06-01 NOTE — Progress Notes (Signed)
Tolerated treatment well. Infusion pump going at 4.9 cc an hr.

## 2011-06-02 ENCOUNTER — Ambulatory Visit (HOSPITAL_COMMUNITY): Payer: Managed Care, Other (non HMO) | Admitting: Oncology

## 2011-06-03 ENCOUNTER — Encounter (HOSPITAL_BASED_OUTPATIENT_CLINIC_OR_DEPARTMENT_OTHER): Payer: Managed Care, Other (non HMO) | Admitting: Oncology

## 2011-06-03 ENCOUNTER — Encounter (HOSPITAL_COMMUNITY): Payer: Managed Care, Other (non HMO)

## 2011-06-03 VITALS — BP 128/87 | HR 79 | Temp 97.9°F | Wt 208.4 lb

## 2011-06-03 DIAGNOSIS — C787 Secondary malignant neoplasm of liver and intrahepatic bile duct: Secondary | ICD-10-CM

## 2011-06-03 DIAGNOSIS — C20 Malignant neoplasm of rectum: Secondary | ICD-10-CM

## 2011-06-03 DIAGNOSIS — D696 Thrombocytopenia, unspecified: Secondary | ICD-10-CM

## 2011-06-03 DIAGNOSIS — C7800 Secondary malignant neoplasm of unspecified lung: Secondary | ICD-10-CM

## 2011-06-03 DIAGNOSIS — E538 Deficiency of other specified B group vitamins: Secondary | ICD-10-CM

## 2011-06-03 MED ORDER — SODIUM CHLORIDE 0.9 % IJ SOLN
10.0000 mL | INTRAMUSCULAR | Status: DC | PRN
  Administered 2011-06-03: 10 mL
  Filled 2011-06-03: qty 10

## 2011-06-03 MED ORDER — HEPARIN SOD (PORK) LOCK FLUSH 100 UNIT/ML IV SOLN
INTRAVENOUS | Status: AC
  Filled 2011-06-03: qty 5

## 2011-06-03 MED ORDER — SODIUM CHLORIDE 0.9 % IJ SOLN
INTRAMUSCULAR | Status: AC
  Administered 2011-06-03: 10 mL
  Filled 2011-06-03: qty 10

## 2011-06-03 MED ORDER — HEPARIN SOD (PORK) LOCK FLUSH 100 UNIT/ML IV SOLN
500.0000 [IU] | Freq: Once | INTRAVENOUS | Status: DC | PRN
  Filled 2011-06-03: qty 5

## 2011-06-03 NOTE — Progress Notes (Signed)
[name] Ribas, MD, MD [address] [place] [address]  1. Adenocarcinoma of rectum  CBC, Differential, Comprehensive metabolic panel, Urinalysis, dipstick only, CBC, Differential, Comprehensive metabolic panel, Urinalysis, dipstick only, CBC, Differential, Comprehensive metabolic panel, Urinalysis, dipstick only  2. B12 deficiency  Vitamin B12    CURRENT THERAPY: S/P 4 cycles of FOLFOX + Avastinchemotherapy. Oxaliplatin was reduced by 50% and 5-FU continuous infusion is reduced by 75%.   INTERVAL HISTORY: [name] 61 y.o. male returns for  regular  visit for followup of  recurrent metastatic rectal cancer.  The patient is tolerating chemotherapy well.  He was held one week due to thrombocytopenia, but otherwise has tolerated chemotherapy well.  I personally reviewed and went over laboratory results with the patient.  The patient does admit to some voice hoarseness 1-2 days following chemotherapy administration.  This resolves spontaneously.  This is most likely a side effect of chemotherapy, Oxaliplatin.  Otherwise, the patient denies any complaints.  He does admit to cold intolerance secondary to Oxaliplatin chemotherapy which resolves spontaneously as expected.   Oncologic ROS questioning negative.  Colostomy is producing stool as expected.    Past Medical History  Diagnosis Date  . Cancer     Colon ca dx 2008 surg/rad/chemo  . Pancytopenia, acquired 12/04/2010  . B12 deficiency 12/07/2010  . Lung cancer   . Hypertension   . Diabetes mellitus   . Hypercholesteremia     has DIABETES MELLITUS, TYPE II; HYPERCHOLESTEROLEMIA; ANEMIA, CHRONIC; CORONARY ARTERY DISEASE; SLEEP APNEA; Adenocarcinoma of rectum; Pancytopenia, acquired; and B12 deficiency on his problem list.     is allergic to oxaprozin.  [name] had no medications administered during this visit.  Past Surgical History  Procedure Date  . Ileostomy 12/07/2010    Procedure:  ILEOSTOMY;  Surgeon: [name];  Location: [facility];  Service: General;  Laterality: N/A;  Diverting Ileostomy Lysis of adhesions Exploratory Laparotomy  . Lung biopsy     rt lobe  . Cholecystectomy     Denies any headaches, dizziness, double vision, fevers, chills, night sweats, nausea, vomiting, diarrhea, constipation, chest pain, heart palpitations, shortness of breath, blood in stool, black tarry stool, urinary pain, urinary burning, urinary frequency, hematuria.   PHYSICAL EXAMINATION  ECOG PERFORMANCE STATUS: 1 - Symptomatic but completely ambulatory  There were no vitals filed for this visit.  GENERAL:alert, no distress, well nourished, well developed, comfortable, cooperative and smiling SKIN: skin color, texture, turgor are normal, no rashes or significant lesions HEAD: Normocephalic, No masses, lesions, tenderness or abnormalities EYES: normal EARS: External ears normal OROPHARYNX:mucous membranes are moist and poor dentition  NECK: supple, no adenopathy, no bruits, thyroid normal size, non-tender, without nodularity, no stridor, non-tender, trachea midline LYMPH:  no palpable lymphadenopathy BREAST:not examined LUNGS: clear to auscultation and percussion HEART: regular rate & rhythm, no murmurs, no gallops, S1 normal and S2 normal ABDOMEN:abdomen soft, non-tender, normal bowel sounds, colostomy appreciated and no hepatosplenomegaly.  Mild abdominal discomfort in the RLQ, followed by Dr. Suzette Battiest. BACK: Back symmetric, no curvature., No CVA tenderness, Stable right sided, thoracic sebaceous cyst.  EXTREMITIES:less then 2 second capillary refill, no joint deformities, effusion, or inflammation, no edema, no skin discoloration, no clubbing, no cyanosis  NEURO: alert & oriented x 3 with fluent speech, no focal motor/sensory deficits, gait normal    LABORATORY DATA: CBC    Component Value Date/Time   WBC 2.6* 05/30/2011 0843   RBC 4.45 05/30/2011 0843   HGB  13.4  05/30/2011 0843   HCT 40.6 05/30/2011 0843   PLT 83* 05/30/2011 0843   MCV 91.2 05/30/2011 0843   MCH 30.1 05/30/2011 0843   MCHC 33.0 05/30/2011 0843   RDW 16.0* 05/30/2011 0843   LYMPHSABS 0.7 05/30/2011 0843   MONOABS 0.4 05/30/2011 0843   EOSABS 0.1 05/30/2011 0843   BASOSABS 0.0 05/30/2011 0843      Chemistry      Component Value Date/Time   NA 142 05/27/2011 0913   K 3.9 05/27/2011 0913   CL 106 05/27/2011 0913   CO2 29 05/27/2011 0913   BUN 8 05/27/2011 0913   CREATININE 1.19 05/27/2011 0913      Component Value Date/Time   CALCIUM 9.7 05/27/2011 0913   ALKPHOS 85 05/27/2011 0913   AST 21 05/27/2011 0913   ALT 20 05/27/2011 0913   BILITOT 0.9 05/27/2011 0913        ASSESSMENT: 1. 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 Thrombocytopenia, secondary to chemotherapy administration. Will follow closely.      PLAN:  1. Pre-chemo lab work ordered up to Cycle 7: CBC diff, CMET, UA 2. Restaging PET scan ordered for 07/11/11. 3. I personally reviewed and went over laboratory results with the patient. 4. Patient education regarding cold intolerance secondary to Oxaliplatin chemotherapy.  5. Return for follow-up on 07/12/11 to review PET scan results and verify that the patient is prepared for cycle 7 of chemotherapy.    All questions were answered. The patient knows to call the clinic with any problems, questions or concerns. We can certainly see the patient much sooner if necessary.  The patient and plan discussed with Glenford Peers, MD and he is in agreement with the aforementioned.   [name]

## 2011-06-03 NOTE — Patient Instructions (Signed)
[facility]  Discharge Instructions  RECOMMENDATIONS MADE BY THE CONSULTANT AND ANY TEST RESULTS WILL BE SENT TO YOUR REFERRING DOCTOR.   EXAM FINDINGS BY MD TODAY AND SIGNS AND SYMPTOMS TO REPORT TO CLINIC OR PRIMARY MD: Continue with chemotherapy as planned. PET scan at the end of 6 cycles of therapy. MD appointment in 1 month.   I acknowledge that I have been informed and understand all the instructions given to me and received a copy. I do not have any more questions at this time, but understand that I may call the Specialty Clinic at [facility] at [phone] during business hours should I have any further questions or need assistance in obtaining follow-up care.    __________________________________________  _____________  __________ Signature of Patient or Authorized Representative            Date                   Time    __________________________________________ Nurse's Signature

## 2011-06-06 ENCOUNTER — Encounter (HOSPITAL_COMMUNITY): Payer: Managed Care, Other (non HMO)

## 2011-06-15 ENCOUNTER — Encounter (HOSPITAL_BASED_OUTPATIENT_CLINIC_OR_DEPARTMENT_OTHER): Payer: Managed Care, Other (non HMO)

## 2011-06-15 ENCOUNTER — Encounter (HOSPITAL_COMMUNITY): Payer: Managed Care, Other (non HMO)

## 2011-06-15 ENCOUNTER — Other Ambulatory Visit (HOSPITAL_COMMUNITY): Payer: Self-pay | Admitting: Oncology

## 2011-06-15 VITALS — BP 137/91 | HR 72 | Temp 97.5°F | Ht 70.0 in | Wt 209.2 lb

## 2011-06-15 DIAGNOSIS — C20 Malignant neoplasm of rectum: Secondary | ICD-10-CM

## 2011-06-15 DIAGNOSIS — Z5111 Encounter for antineoplastic chemotherapy: Secondary | ICD-10-CM

## 2011-06-15 LAB — DIFFERENTIAL
Basophils Absolute: 0 10*3/uL (ref 0.0–0.1)
Basophils Relative: 0 % (ref 0–1)
Eosinophils Absolute: 0.1 10*3/uL (ref 0.0–0.7)
Monocytes Absolute: 0.4 10*3/uL (ref 0.1–1.0)
Neutro Abs: 3 10*3/uL (ref 1.7–7.7)
Neutrophils Relative %: 73 % (ref 43–77)

## 2011-06-15 LAB — COMPREHENSIVE METABOLIC PANEL
AST: 21 U/L (ref 0–37)
Albumin: 3.5 g/dL (ref 3.5–5.2)
Chloride: 109 mEq/L (ref 96–112)
Creatinine, Ser: 1.13 mg/dL (ref 0.50–1.35)
Potassium: 4.1 mEq/L (ref 3.5–5.1)
Total Bilirubin: 1.1 mg/dL (ref 0.3–1.2)
Total Protein: 6.8 g/dL (ref 6.0–8.3)

## 2011-06-15 LAB — URINALYSIS, DIPSTICK ONLY
Glucose, UA: NEGATIVE mg/dL
Leukocytes, UA: NEGATIVE
Specific Gravity, Urine: 1.03 (ref 1.005–1.030)
pH: 5.5 (ref 5.0–8.0)

## 2011-06-15 LAB — VITAMIN B12: Vitamin B-12: 518 pg/mL (ref 211–911)

## 2011-06-15 LAB — CBC
MCHC: 34.2 g/dL (ref 30.0–36.0)
RDW: 15.6 % — ABNORMAL HIGH (ref 11.5–15.5)

## 2011-06-15 LAB — CEA: CEA: 2.7 ng/mL (ref 0.0–5.0)

## 2011-06-15 MED ORDER — SODIUM CHLORIDE 0.9 % IJ SOLN
INTRAMUSCULAR | Status: AC
  Filled 2011-06-15: qty 10

## 2011-06-15 MED ORDER — SODIUM CHLORIDE 0.9 % IV SOLN
INTRAVENOUS | Status: DC
  Administered 2011-06-15: 10:00:00 via INTRAVENOUS

## 2011-06-15 MED ORDER — SODIUM CHLORIDE 0.9 % IV SOLN: Freq: Once | INTRAVENOUS | Status: DC

## 2011-06-15 MED ORDER — HEPARIN SOD (PORK) LOCK FLUSH 100 UNIT/ML IV SOLN
500.0000 [IU] | Freq: Once | INTRAVENOUS | Status: AC | PRN
  Administered 2011-06-15: 500 [IU]
  Filled 2011-06-15: qty 5

## 2011-06-15 MED ORDER — BEVACIZUMAB CHEMO INJECTION 400 MG/16ML
5.0000 mg/kg | Freq: Once | INTRAVENOUS | Status: AC
  Administered 2011-06-15: 450 mg via INTRAVENOUS
  Filled 2011-06-15: qty 18

## 2011-06-15 MED ORDER — SODIUM CHLORIDE 0.9 % IJ SOLN
10.0000 mL | INTRAMUSCULAR | Status: DC | PRN
  Administered 2011-06-15: 10 mL
  Filled 2011-06-15: qty 10

## 2011-06-15 MED ORDER — SODIUM CHLORIDE 0.9 % IV SOLN
Freq: Once | INTRAVENOUS | Status: DC
  Filled 2011-06-15: qty 4

## 2011-06-15 MED ORDER — HEPARIN SOD (PORK) LOCK FLUSH 100 UNIT/ML IV SOLN
INTRAVENOUS | Status: AC
  Filled 2011-06-15: qty 5

## 2011-06-15 NOTE — Progress Notes (Signed)
Chemo held today for low plt count.  Rescheduled for next Wednesday. Tolerated Avastin infusion well.

## 2011-06-16 ENCOUNTER — Encounter (HOSPITAL_COMMUNITY): Payer: Managed Care, Other (non HMO)

## 2011-06-17 ENCOUNTER — Encounter (HOSPITAL_COMMUNITY): Payer: Managed Care, Other (non HMO)

## 2011-06-22 ENCOUNTER — Encounter (HOSPITAL_BASED_OUTPATIENT_CLINIC_OR_DEPARTMENT_OTHER): Payer: Managed Care, Other (non HMO)

## 2011-06-22 VITALS — BP 131/94 | HR 16 | Temp 97.0°F | Wt 211.8 lb

## 2011-06-22 DIAGNOSIS — C20 Malignant neoplasm of rectum: Secondary | ICD-10-CM

## 2011-06-22 LAB — CBC
HCT: 40.7 % (ref 39.0–52.0)
MCV: 91.5 fL (ref 78.0–100.0)
RBC: 4.45 MIL/uL (ref 4.22–5.81)
WBC: 1.9 10*3/uL — ABNORMAL LOW (ref 4.0–10.5)

## 2011-06-22 LAB — COMPREHENSIVE METABOLIC PANEL
AST: 33 U/L (ref 0–37)
Albumin: 3.7 g/dL (ref 3.5–5.2)
Alkaline Phosphatase: 95 U/L (ref 39–117)
BUN: 7 mg/dL (ref 6–23)
CO2: 27 mEq/L (ref 19–32)
Chloride: 107 mEq/L (ref 96–112)
GFR calc non Af Amer: 64 mL/min — ABNORMAL LOW (ref >90)
Potassium: 3.9 mEq/L (ref 3.5–5.1)
Total Bilirubin: 1 mg/dL (ref 0.3–1.2)

## 2011-06-22 LAB — DIFFERENTIAL
Basophils Absolute: 0 10*3/uL (ref 0.0–0.1)
Eosinophils Relative: 3 % (ref 0–5)
Lymphocytes Relative: 29 % (ref 12–46)
Lymphs Abs: 0.6 10*3/uL — ABNORMAL LOW (ref 0.7–4.0)
Monocytes Absolute: 0.4 10*3/uL (ref 0.1–1.0)
Neutro Abs: 0.9 10*3/uL — ABNORMAL LOW (ref 1.7–7.7)

## 2011-06-22 MED ORDER — HEPARIN SOD (PORK) LOCK FLUSH 100 UNIT/ML IV SOLN
INTRAVENOUS | Status: AC
  Filled 2011-06-22: qty 5

## 2011-06-22 MED ORDER — SODIUM CHLORIDE 0.9 % IJ SOLN
INTRAMUSCULAR | Status: AC
  Filled 2011-06-22: qty 10

## 2011-06-22 NOTE — Progress Notes (Signed)
Patient held for 1 week related to labs, per Dr. [name].  Patient informed to set up an appointment for next week.

## 2011-06-24 ENCOUNTER — Encounter (HOSPITAL_COMMUNITY): Payer: Managed Care, Other (non HMO)

## 2011-06-29 ENCOUNTER — Encounter (HOSPITAL_BASED_OUTPATIENT_CLINIC_OR_DEPARTMENT_OTHER): Payer: Managed Care, Other (non HMO)

## 2011-06-29 ENCOUNTER — Inpatient Hospital Stay (HOSPITAL_COMMUNITY): Payer: Managed Care, Other (non HMO)

## 2011-06-29 ENCOUNTER — Encounter (HOSPITAL_COMMUNITY): Payer: Managed Care, Other (non HMO)

## 2011-06-29 ENCOUNTER — Other Ambulatory Visit (HOSPITAL_COMMUNITY): Payer: Self-pay | Admitting: Oncology

## 2011-06-29 DIAGNOSIS — Z5111 Encounter for antineoplastic chemotherapy: Secondary | ICD-10-CM

## 2011-06-29 DIAGNOSIS — C20 Malignant neoplasm of rectum: Secondary | ICD-10-CM

## 2011-06-29 LAB — COMPREHENSIVE METABOLIC PANEL
ALT: 16 U/L (ref 0–53)
AST: 22 U/L (ref 0–37)
Albumin: 3.8 g/dL (ref 3.5–5.2)
Calcium: 9.8 mg/dL (ref 8.4–10.5)
Sodium: 140 mEq/L (ref 135–145)
Total Protein: 7.1 g/dL (ref 6.0–8.3)

## 2011-06-29 LAB — CBC
MCH: 30.7 pg (ref 26.0–34.0)
MCV: 91.3 fL (ref 78.0–100.0)
Platelets: 77 10*3/uL — ABNORMAL LOW (ref 150–400)
RDW: 14.6 % (ref 11.5–15.5)
WBC: 4.6 10*3/uL (ref 4.0–10.5)

## 2011-06-29 LAB — DIFFERENTIAL
Basophils Absolute: 0 10*3/uL (ref 0.0–0.1)
Eosinophils Absolute: 0.1 10*3/uL (ref 0.0–0.7)
Eosinophils Relative: 2 % (ref 0–5)

## 2011-06-29 LAB — URINALYSIS, DIPSTICK ONLY
Glucose, UA: NEGATIVE mg/dL
Ketones, ur: NEGATIVE mg/dL
Protein, ur: 30 mg/dL — AB

## 2011-06-29 MED ORDER — SODIUM CHLORIDE 0.9 % IV SOLN
Freq: Once | INTRAVENOUS | Status: AC
  Administered 2011-06-29: 8 mg via INTRAVENOUS
  Filled 2011-06-29: qty 4

## 2011-06-29 MED ORDER — SODIUM CHLORIDE 0.9 % IV SOLN: 8.0000 mg | Freq: Once | INTRAVENOUS | Status: DC

## 2011-06-29 MED ORDER — FLUOROURACIL CHEMO INJECTION 5 GM/100ML
1800.0000 mg/m2 | INTRAVENOUS | Status: DC
  Administered 2011-06-29: 3800 mg via INTRAVENOUS
  Filled 2011-06-29 (×2): qty 76

## 2011-06-29 MED ORDER — LEUCOVORIN CALCIUM INJECTION 100 MG
44.0000 mg | Freq: Once | INTRAMUSCULAR | Status: AC
  Administered 2011-06-29: 44 mg via INTRAVENOUS
  Filled 2011-06-29: qty 2.2

## 2011-06-29 MED ORDER — SODIUM CHLORIDE 0.9 % IJ SOLN
10.0000 mL | INTRAMUSCULAR | Status: DC | PRN
  Filled 2011-06-29: qty 10

## 2011-06-29 MED ORDER — SODIUM CHLORIDE 0.9 % IJ SOLN
INTRAMUSCULAR | Status: AC
  Filled 2011-06-29: qty 10

## 2011-06-29 MED ORDER — DEXTROSE 5 % IV BOLUS
50.0000 mL | INTRAVENOUS | Status: AC
  Filled 2011-06-29 (×2): qty 50

## 2011-06-29 MED ORDER — HEPARIN SOD (PORK) LOCK FLUSH 100 UNIT/ML IV SOLN
500.0000 [IU] | Freq: Once | INTRAVENOUS | Status: DC | PRN
  Filled 2011-06-29: qty 5

## 2011-06-29 MED ORDER — LEUCOVORIN CALCIUM INJECTION 350 MG: 400.0000 mg/m2 | Freq: Once | INTRAVENOUS | Status: DC

## 2011-06-29 MED ORDER — FLUOROURACIL CHEMO INJECTION 2.5 GM/50ML
400.0000 mg/m2 | Freq: Once | INTRAVENOUS | Status: AC
  Administered 2011-06-29: 850 mg via INTRAVENOUS
  Filled 2011-06-29: qty 17

## 2011-06-29 MED ORDER — DEXAMETHASONE SODIUM PHOSPHATE 10 MG/ML IJ SOLN: 10.0000 mg | Freq: Once | INTRAMUSCULAR | Status: DC

## 2011-06-29 MED ORDER — SODIUM CHLORIDE 0.9 % IV SOLN
5.0000 mg/kg | Freq: Once | INTRAVENOUS | Status: AC
  Administered 2011-06-29: 450 mg via INTRAVENOUS
  Filled 2011-06-29: qty 18

## 2011-06-29 MED ORDER — SODIUM CHLORIDE 0.9 % IV SOLN
INTRAVENOUS | Status: DC
  Administered 2011-06-29: 10:00:00 via INTRAVENOUS

## 2011-06-29 MED ORDER — OXALIPLATIN CHEMO INJECTION 100 MG/20ML
42.5000 mg/m2 | Freq: Once | INTRAVENOUS | Status: AC
  Administered 2011-06-29: 90 mg via INTRAVENOUS
  Filled 2011-06-29: qty 18

## 2011-07-01 ENCOUNTER — Encounter (HOSPITAL_COMMUNITY): Payer: Managed Care, Other (non HMO) | Attending: Oncology

## 2011-07-01 ENCOUNTER — Other Ambulatory Visit (HOSPITAL_COMMUNITY): Payer: Self-pay | Admitting: Oncology

## 2011-07-01 VITALS — BP 134/89 | HR 70

## 2011-07-01 DIAGNOSIS — E538 Deficiency of other specified B group vitamins: Secondary | ICD-10-CM | POA: Insufficient documentation

## 2011-07-01 DIAGNOSIS — C20 Malignant neoplasm of rectum: Secondary | ICD-10-CM | POA: Insufficient documentation

## 2011-07-01 DIAGNOSIS — D61818 Other pancytopenia: Secondary | ICD-10-CM | POA: Insufficient documentation

## 2011-07-01 MED ORDER — HEPARIN SOD (PORK) LOCK FLUSH 100 UNIT/ML IV SOLN
500.0000 [IU] | Freq: Once | INTRAVENOUS | Status: AC
  Administered 2011-07-01: 500 [IU] via INTRAVENOUS
  Filled 2011-07-01: qty 5

## 2011-07-01 MED ORDER — HEPARIN SOD (PORK) LOCK FLUSH 100 UNIT/ML IV SOLN
INTRAVENOUS | Status: AC
  Filled 2011-07-01: qty 5

## 2011-07-01 MED ORDER — SODIUM CHLORIDE 0.9 % IJ SOLN
INTRAMUSCULAR | Status: AC
  Filled 2011-07-01: qty 10

## 2011-07-01 MED ORDER — SODIUM CHLORIDE 0.9 % IJ SOLN
10.0000 mL | INTRAMUSCULAR | Status: DC | PRN
  Administered 2011-07-01: 10 mL via INTRAVENOUS
  Filled 2011-07-01: qty 10

## 2011-07-01 NOTE — Progress Notes (Signed)
Continuous infusion pump d/c. VSS. Marland Kitchen  Port flushed with ns and Heparin.

## 2011-07-11 ENCOUNTER — Encounter (HOSPITAL_COMMUNITY)
Admit: 2011-07-11 | Discharge: 2011-07-11 | Disposition: A | Discharge status: Home / Self Care | Payer: Managed Care, Other (non HMO) | Attending: Oncology | Admitting: Oncology

## 2011-07-11 ENCOUNTER — Encounter (HOSPITAL_COMMUNITY): Payer: Self-pay

## 2011-07-11 DIAGNOSIS — C787 Secondary malignant neoplasm of liver and intrahepatic bile duct: Secondary | ICD-10-CM | POA: Insufficient documentation

## 2011-07-11 DIAGNOSIS — N200 Calculus of kidney: Secondary | ICD-10-CM | POA: Insufficient documentation

## 2011-07-11 DIAGNOSIS — N289 Disorder of kidney and ureter, unspecified: Secondary | ICD-10-CM | POA: Insufficient documentation

## 2011-07-11 DIAGNOSIS — C20 Malignant neoplasm of rectum: Secondary | ICD-10-CM | POA: Insufficient documentation

## 2011-07-11 MED ORDER — FLUDEOXYGLUCOSE F - 18 (FDG) INJECTION
19.0000 | Freq: Once | INTRAVENOUS | Status: AC | PRN
  Administered 2011-07-11: 19 via INTRAVENOUS

## 2011-07-13 ENCOUNTER — Other Ambulatory Visit (HOSPITAL_COMMUNITY): Payer: Self-pay | Admitting: Oncology

## 2011-07-13 ENCOUNTER — Encounter (HOSPITAL_BASED_OUTPATIENT_CLINIC_OR_DEPARTMENT_OTHER): Payer: Managed Care, Other (non HMO) | Admitting: Oncology

## 2011-07-13 ENCOUNTER — Encounter (HOSPITAL_BASED_OUTPATIENT_CLINIC_OR_DEPARTMENT_OTHER): Payer: Managed Care, Other (non HMO)

## 2011-07-13 DIAGNOSIS — I878 Other specified disorders of veins: Secondary | ICD-10-CM

## 2011-07-13 DIAGNOSIS — E538 Deficiency of other specified B group vitamins: Secondary | ICD-10-CM

## 2011-07-13 DIAGNOSIS — C20 Malignant neoplasm of rectum: Secondary | ICD-10-CM

## 2011-07-13 DIAGNOSIS — C7800 Secondary malignant neoplasm of unspecified lung: Secondary | ICD-10-CM

## 2011-07-13 DIAGNOSIS — C787 Secondary malignant neoplasm of liver and intrahepatic bile duct: Secondary | ICD-10-CM

## 2011-07-13 LAB — DIFFERENTIAL
Basophils Absolute: 0 10*3/uL (ref 0.0–0.1)
Basophils Relative: 1 % (ref 0–1)
Eosinophils Relative: 1 % (ref 0–5)
Monocytes Absolute: 0.5 10*3/uL (ref 0.1–1.0)
Monocytes Relative: 12 % (ref 3–12)
Neutro Abs: 2.9 10*3/uL (ref 1.7–7.7)

## 2011-07-13 LAB — COMPREHENSIVE METABOLIC PANEL
ALT: 23 U/L (ref 0–53)
AST: 23 U/L (ref 0–37)
Albumin: 3.6 g/dL (ref 3.5–5.2)
Alkaline Phosphatase: 79 U/L (ref 39–117)
BUN: 8 mg/dL (ref 6–23)
CO2: 25 mEq/L (ref 19–32)
Calcium: 9.4 mg/dL (ref 8.4–10.5)
Chloride: 105 mEq/L (ref 96–112)
Creatinine, Ser: 1.15 mg/dL (ref 0.50–1.35)
GFR calc Af Amer: 79 mL/min — ABNORMAL LOW (ref >90)
GFR calc non Af Amer: 68 mL/min — ABNORMAL LOW (ref >90)
Glucose, Bld: 78 mg/dL (ref 70–99)
Potassium: 3.6 mEq/L (ref 3.5–5.1)
Sodium: 140 mEq/L (ref 135–145)
Total Bilirubin: 1.4 mg/dL — ABNORMAL HIGH (ref 0.3–1.2)
Total Protein: 6.8 g/dL (ref 6.0–8.3)

## 2011-07-13 LAB — CBC
HCT: 40.6 % (ref 39.0–52.0)
Hemoglobin: 13.5 g/dL (ref 13.0–17.0)
MCHC: 33.3 g/dL (ref 30.0–36.0)
MCV: 91.6 fL (ref 78.0–100.0)
RDW: 14.5 % (ref 11.5–15.5)

## 2011-07-13 MED ORDER — SODIUM CHLORIDE 0.9 % IJ SOLN
INTRAMUSCULAR | Status: AC
  Filled 2011-07-13: qty 10

## 2011-07-13 MED ORDER — SODIUM CHLORIDE 0.9 % IJ SOLN
INTRAMUSCULAR | Status: AC
  Administered 2011-07-13: 10 mL via INTRAVENOUS
  Filled 2011-07-13: qty 10

## 2011-07-13 MED ORDER — CYANOCOBALAMIN 1000 MCG/ML IJ SOLN
INTRAMUSCULAR | Status: AC
  Administered 2011-07-13: 1000 ug via INTRAMUSCULAR
  Filled 2011-07-13: qty 1

## 2011-07-13 MED ORDER — CYANOCOBALAMIN 1000 MCG/ML IJ SOLN
1000.0000 ug | Freq: Once | INTRAMUSCULAR | Status: AC
  Administered 2011-07-13: 1000 ug via INTRAMUSCULAR

## 2011-07-13 MED ORDER — HEPARIN SOD (PORK) LOCK FLUSH 100 UNIT/ML IV SOLN
INTRAVENOUS | Status: AC
  Administered 2011-07-13: 500 [IU] via INTRAVENOUS
  Filled 2011-07-13: qty 5

## 2011-07-13 MED ORDER — HEPARIN SOD (PORK) LOCK FLUSH 100 UNIT/ML IV SOLN
500.0000 [IU] | Freq: Once | INTRAVENOUS | Status: AC
  Administered 2011-07-13: 500 [IU] via INTRAVENOUS
  Filled 2011-07-13: qty 5

## 2011-07-13 MED ORDER — SODIUM CHLORIDE 0.9 % IJ SOLN
10.0000 mL | INTRAMUSCULAR | Status: DC | PRN
  Administered 2011-07-13: 10 mL via INTRAVENOUS
  Filled 2011-07-13: qty 10

## 2011-07-13 NOTE — Progress Notes (Signed)
[name], MD, MD [address] [place] [address]  1. Adenocarcinoma of rectum  CEA, CBC, Differential, Comprehensive metabolic panel, Urinalysis, dipstick only  2. Poor venous access      CURRENT THERAPY:S/P 6 cycles of FOLFOX + Avastinchemotherapy. Oxaliplatin was reduced by 50% and 5-FU continuous infusion is reduced by 75%.    INTERVAL HISTORY: [name] 61 y.o. male returns for  regular  visit for followup of recurrent metastatic rectal cancer.  The goal of today's visit to discuss his most recent restaging PET scan which was performed the other day.  I personally reviewed and went over radiographic studies with the patient.  He had a mixed response with anastomic and hepatic lesions less hypermetabolic, but there was slight growth of the right upper lobe lung nodule.    I personally reviewed and went over laboratory results with the patient.  His CEA continues to decrease and is most recently within normal limits at 2.7 on 06/15/2011.  The patient reports that his appetite is strong.  He has gained some weight.  He denies any complaints.    Past Medical History  Diagnosis Date  . Cancer     Colon ca dx 2008 surg/rad/chemo  . Pancytopenia, acquired 12/04/2010  . B12 deficiency 12/07/2010  . Lung cancer   . Hypertension   . Diabetes mellitus   . Hypercholesteremia     has DIABETES MELLITUS, TYPE II; HYPERCHOLESTEROLEMIA; ANEMIA, CHRONIC; CORONARY ARTERY DISEASE; SLEEP APNEA; Adenocarcinoma of rectum; Pancytopenia, acquired; and B12 deficiency on his problem list.     is allergic to daypro.  Mr. Mesick had no medications administered during this visit.  Past Surgical History  Procedure Date  . Ileostomy 12/07/2010    Procedure: ILEOSTOMY;  Surgeon: [name];  Location: [facility];  Service: General;  Laterality: N/A;  Diverting Ileostomy Lysis of adhesions Exploratory Laparotomy  . Lung biopsy     rt lobe  . Cholecystectomy      Denies any headaches, dizziness, double vision, fevers, chills, night sweats, nausea, vomiting, diarrhea, constipation, chest pain, heart palpitations, shortness of breath, blood in stool, black tarry stool, urinary pain, urinary burning, urinary frequency, hematuria.   PHYSICAL EXAMINATION  ECOG PERFORMANCE STATUS: 1 - Symptomatic but completely ambulatory  Filed Vitals:   07/13/11 0900  BP: 118/80  Pulse: 62  Temp: 97.9 F (36.6 C)    GENERAL:alert, no distress, well nourished, well developed, comfortable, cooperative and smiling SKIN: skin color, texture, turgor are normal, no rashes or significant lesions HEAD: Normocephalic, No masses, lesions, tenderness or abnormalities EYES: normal EARS: External ears normal OROPHARYNX:mucous membranes are moist, poor dentition NECK: supple, trachea midline LYMPH:  no palpable lymphadenopathy BREAST:not examined LUNGS: clear to auscultation and percussion HEART: regular rate & rhythm, no murmurs, no gallops, S1 normal and S2 normal ABDOMEN:abdomen soft, non-tender and normal bowel sounds BACK: Back symmetric, no curvature., No CVA tenderness EXTREMITIES:less then 2 second capillary refill, no joint deformities, effusion, or inflammation, no edema, no skin discoloration, no clubbing, no cyanosis  NEURO: alert & oriented x 3 with fluent speech, no focal motor/sensory deficits, gait normal   LABORATORY DATA: CBC    Component Value Date/Time   WBC 4.6 06/29/2011 0850   RBC 4.82 06/29/2011 0850   HGB 14.8 06/29/2011 0850   HCT 44.0 06/29/2011 0850   PLT 77* 06/29/2011 0850   MCV 91.3 06/29/2011 0850   MCH 30.7 06/29/2011 0850   MCHC 33.6 06/29/2011 0850  RDW 14.6 06/29/2011 0850   LYMPHSABS 0.9 06/29/2011 0850   MONOABS 0.6 06/29/2011 0850   EOSABS 0.1 06/29/2011 0850   BASOSABS 0.0 06/29/2011 0850      Chemistry      Component Value Date/Time   NA 140 06/29/2011 0850   K 3.9 06/29/2011 0850   CL 104 06/29/2011 0850   CO2 26  06/29/2011 0850   BUN 7 06/29/2011 0850   CREATININE 1.25 06/29/2011 0850      Component Value Date/Time   CALCIUM 9.8 06/29/2011 0850   ALKPHOS 90 06/29/2011 0850   AST 22 06/29/2011 0850   ALT 16 06/29/2011 0850   BILITOT 0.8 06/29/2011 0850     Lab Results  Component Value Date   CEA 2.7 06/15/2011      PENDING LABS: CBC diff, CMET, CEA   RADIOGRAPHIC STUDIES:  Nm Pet Image Restag (ps) Skull Base To Thigh  07/11/2011  *RADIOLOGY REPORT*  Clinical Data: Subsequent treatment strategy for recurrent metastatic rectal cancer.  NUCLEAR MEDICINE PET CT RESTAGING (PS) SKULL BASE TO THIGH  Technique:  19.0 mCi F-18 FDG was injected intravenously via the left power port.  Full-ring PET imaging was performed from the skull base through the mid-thighs 60  minutes after injection.  CT data was obtained and used for attenuation correction and anatomic localization only.  (This was not acquired as a diagnostic CT examination.)  Fasting Blood Glucose:  89  Patient Weight:  212 pounds.  Comparison: 03/28/2011.  Findings:  Neck:  No areas of abnormal hypermetabolism.  CT images show no acute findings.  Chest:  A spiculated nodule in the apical segment right upper lobe measures 12 x 14 mm with an S U V max of 3.3.  On baseline examination of 10/13/2010, this nodule measured 10 x 10 mm with a similar S U V max of 3.5. No additional areas of abnormal hypermetabolism in the chest.  CT images show extensive coronary artery calcification.  No pericardial or pleural effusion. Probable sebaceous cyst is seen overlying the right paraspinous musculature, as before.  Abdomen/pelvis:  A hypermetabolic lesion in the left hepatic lobe is poorly visualized on CT, but has an S U V max of 6.1 (previously 8.6).  Similarly, a hypermetabolic lesion in the right hepatic lobe has an S U V max of 6.4 (previously 17.3).  In the ileocolic anastomosis, focal hypermetabolism has an S U V max of 6.2 (PET image 197), compared to 12.9  previously.  CT images show low attenuation lesions in the kidneys, measuring up to 3.6 cm in the lower pole left kidney, as before.  Stones are seen in the kidneys bilaterally.  Postoperative changes of subtotal colectomy noted.  Skeleton:  Previously seen focal uptake in the spinous process of L2 is less intense on the current study, with an S U V max of 2.5 (previously 4.1).  No corresponding CT abnormality is identified.  IMPRESSION:  1.  Slight interval enlargement of a spiculated hypermetabolic right upper lobe nodule when compared with baseline examination of 10/13/2010. 2.  Interval decrease in degree of hypermetabolism within known hepatic metastases. 3.  Decrease in focal hypermetabolism associated with the ileocolic anastomosis. 4.  Bilateral renal stones.  Original Report Authenticated By: Reyes Ivan, M.D.      ASSESSMENT:  1. 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  S/P 6 cycles of FOLFOX + Avastinchemotherapy. Oxaliplatin was reduced by 50% and 5-FU continuous infusion is reduced by 75%.  Most  recent PET scan had a mixed response with liver and anastomic regions noted to be less hypermetabolic, but there is a slight increase in size of the right upper lobe lung lesion.  2. Thrombocytopenia, secondary to chemotherapy administration. Will follow closely.  3. B12 Deficiency   PLAN:  1. B12 injection today and monthly.  Supportive therapy plan built.   2. Lab work today: CBC diff, CMET, CEA 3. UA on day of therapy. 4. I personally reviewed and went over laboratory results with the patient. 5. I personally reviewed and went over radiographic studies with the patient. 6. Will move on to cycle 7 of chemotherapy next Wednesday (07/20/2011) 7. Return in 4-5 weeks for follow-up.  All questions were answered. The patient knows to call the clinic with any problems, questions or concerns. We can certainly see the patient much sooner if necessary.  The patient and plan  discussed with Glenford Peers, MD and he is in agreement with the aforementioned.  Cyndal Kasson

## 2011-07-13 NOTE — Patient Instructions (Signed)
NIV DARLEY  [id number] 13-Jun-1950   [facility]  Discharge Instructions  RECOMMENDATIONS MADE BY THE CONSULTANT AND ANY TEST RESULTS WILL BE SENT TO YOUR REFERRING DOCTOR.   EXAM FINDINGS BY MD TODAY AND SIGNS AND SYMPTOMS TO REPORT TO CLINIC OR PRIMARY MD: your scan shows some improvement so we will continue your current therapy.  Will give you your B12 injection today and again in 4 weeks.  MEDICATIONS PRESCRIBED: none   INSTRUCTIONS GIVEN AND DISCUSSED: Other:  Report uncontrolled pain, uncontrolled nausea, fevers, shortness of breath, etc.  SPECIAL INSTRUCTIONS/FOLLOW-UP: Return to Clinic:  As scheduled.   I acknowledge that I have been informed and understand all the instructions given to me and received a copy. I do not have any more questions at this time, but understand that I may call the Specialty Clinic at [facility] at [phone] during business hours should I have any further questions or need assistance in obtaining follow-up care.    __________________________________________  _____________  __________ Signature of Patient or Authorized Representative            Date                   Time    __________________________________________ Nurse's Signature

## 2011-07-13 NOTE — Progress Notes (Signed)
[name] presents today for injection per MD orders. B12 1000 mg administered IM in left Upper Arm. Administration without incident. Patient tolerated well.  [name] presented for blood draw via [place] placement of portacath confirmed by CXR. Portacath located left chest wall accessed with  H 20 needle. Good blood return present and blood drawn for lab work Portacath flushed with 20ml NS and 500U/4ml Heparin and needle removed intact. Procedure without incident. Patient tolerated procedure well.

## 2011-07-14 ENCOUNTER — Telehealth (HOSPITAL_COMMUNITY): Payer: Self-pay | Admitting: *Deleted

## 2011-07-14 LAB — CEA: CEA: 1.3 ng/mL (ref 0.0–5.0)

## 2011-07-14 NOTE — Telephone Encounter (Signed)
Message copied by Dennie Maizes on Thu Jul 14, 2011 10:30 AM ------      Message from: Ellouise Newer III      Created: Thu Jul 14, 2011  9:38 AM       Wow!  Let the patient know

## 2011-07-14 NOTE — Telephone Encounter (Signed)
Message copied by Dennie Maizes on Thu Jul 14, 2011 10:29 AM ------      Message from: Ellouise Newer III      Created: Thu Jul 14, 2011  9:38 AM       Wow!  Let the patient know

## 2011-07-14 NOTE — Telephone Encounter (Signed)
Spoke with pt as below. Verbalized understanding. 

## 2011-07-20 ENCOUNTER — Encounter (HOSPITAL_BASED_OUTPATIENT_CLINIC_OR_DEPARTMENT_OTHER): Payer: Managed Care, Other (non HMO)

## 2011-07-20 VITALS — Wt 216.2 lb

## 2011-07-20 DIAGNOSIS — C20 Malignant neoplasm of rectum: Secondary | ICD-10-CM

## 2011-07-20 DIAGNOSIS — Z5111 Encounter for antineoplastic chemotherapy: Secondary | ICD-10-CM

## 2011-07-20 LAB — DIFFERENTIAL
Basophils Absolute: 0 10*3/uL (ref 0.0–0.1)
Basophils Relative: 0 % (ref 0–1)
Lymphocytes Relative: 28 % (ref 12–46)
Neutro Abs: 1.5 10*3/uL — ABNORMAL LOW (ref 1.7–7.7)
Neutrophils Relative %: 48 % (ref 43–77)

## 2011-07-20 LAB — URINALYSIS, DIPSTICK ONLY
Glucose, UA: NEGATIVE mg/dL
Leukocytes, UA: NEGATIVE
pH: 6 (ref 5.0–8.0)

## 2011-07-20 LAB — CBC
MCHC: 33.4 g/dL (ref 30.0–36.0)
Platelets: 76 10*3/uL — ABNORMAL LOW (ref 150–400)
RDW: 14.4 % (ref 11.5–15.5)
WBC: 3.1 10*3/uL — ABNORMAL LOW (ref 4.0–10.5)

## 2011-07-20 MED ORDER — LEUCOVORIN CALCIUM INJECTION 350 MG: 400.0000 mg/m2 | Freq: Once | INTRAVENOUS | Status: DC

## 2011-07-20 MED ORDER — SODIUM CHLORIDE 0.9 % IV SOLN
1800.0000 mg/m2 | INTRAVENOUS | Status: DC
  Administered 2011-07-20: 3800 mg via INTRAVENOUS
  Filled 2011-07-20 (×2): qty 76

## 2011-07-20 MED ORDER — LEUCOVORIN CALCIUM INJECTION 100 MG
44.0000 mg | Freq: Once | INTRAMUSCULAR | Status: AC
  Administered 2011-07-20: 44 mg via INTRAVENOUS
  Filled 2011-07-20: qty 2.2

## 2011-07-20 MED ORDER — DEXTROSE 5 % IV SOLN: Freq: Once | INTRAVENOUS | Status: DC

## 2011-07-20 MED ORDER — DEXAMETHASONE SODIUM PHOSPHATE 10 MG/ML IJ SOLN: 10.0000 mg | Freq: Once | INTRAMUSCULAR | Status: DC

## 2011-07-20 MED ORDER — OXALIPLATIN CHEMO INJECTION 100 MG/20ML
42.5000 mg/m2 | Freq: Once | INTRAVENOUS | Status: AC
  Administered 2011-07-20: 90 mg via INTRAVENOUS
  Filled 2011-07-20 (×2): qty 18

## 2011-07-20 MED ORDER — SODIUM CHLORIDE 0.9 % IV SOLN
5.0000 mg/kg | Freq: Once | INTRAVENOUS | Status: AC
  Administered 2011-07-20: 450 mg via INTRAVENOUS
  Filled 2011-07-20: qty 18

## 2011-07-20 MED ORDER — DEXTROSE 5 % IV BOLUS
50.0000 mL | INTRAVENOUS | Status: AC
  Filled 2011-07-20 (×2): qty 50

## 2011-07-20 MED ORDER — SODIUM CHLORIDE 0.9 % IV SOLN
Freq: Once | INTRAVENOUS | Status: AC
  Administered 2011-07-20: 8 mg via INTRAVENOUS
  Filled 2011-07-20: qty 4

## 2011-07-20 MED ORDER — SODIUM CHLORIDE 0.9 % IV SOLN: 8.0000 mg | Freq: Once | INTRAVENOUS | Status: DC

## 2011-07-20 MED ORDER — FLUOROURACIL CHEMO INJECTION 2.5 GM/50ML
400.0000 mg/m2 | Freq: Once | INTRAVENOUS | Status: AC
  Administered 2011-07-20: 850 mg via INTRAVENOUS
  Filled 2011-07-20: qty 17

## 2011-07-22 ENCOUNTER — Encounter (HOSPITAL_BASED_OUTPATIENT_CLINIC_OR_DEPARTMENT_OTHER): Payer: Managed Care, Other (non HMO)

## 2011-07-22 DIAGNOSIS — Z452 Encounter for adjustment and management of vascular access device: Secondary | ICD-10-CM

## 2011-07-22 DIAGNOSIS — C20 Malignant neoplasm of rectum: Secondary | ICD-10-CM

## 2011-07-22 MED ORDER — HEPARIN SOD (PORK) LOCK FLUSH 100 UNIT/ML IV SOLN
INTRAVENOUS | Status: AC
  Filled 2011-07-22: qty 5

## 2011-07-22 MED ORDER — HEPARIN SOD (PORK) LOCK FLUSH 100 UNIT/ML IV SOLN
500.0000 [IU] | Freq: Once | INTRAVENOUS | Status: AC | PRN
  Administered 2011-07-22: 500 [IU]
  Filled 2011-07-22: qty 5

## 2011-07-22 MED ORDER — SODIUM CHLORIDE 0.9 % IJ SOLN
INTRAMUSCULAR | Status: AC
  Filled 2011-07-22: qty 10

## 2011-07-22 MED ORDER — SODIUM CHLORIDE 0.9 % IJ SOLN
10.0000 mL | INTRAMUSCULAR | Status: DC | PRN
  Administered 2011-07-22: 10 mL
  Filled 2011-07-22: qty 10

## 2011-08-03 ENCOUNTER — Encounter (HOSPITAL_COMMUNITY): Payer: Managed Care, Other (non HMO) | Attending: Oncology

## 2011-08-03 ENCOUNTER — Other Ambulatory Visit (HOSPITAL_COMMUNITY): Payer: Self-pay | Admitting: Oncology

## 2011-08-03 VITALS — BP 135/92 | HR 84 | Temp 97.2°F | Wt 221.2 lb

## 2011-08-03 DIAGNOSIS — E538 Deficiency of other specified B group vitamins: Secondary | ICD-10-CM | POA: Insufficient documentation

## 2011-08-03 DIAGNOSIS — Z5112 Encounter for antineoplastic immunotherapy: Secondary | ICD-10-CM

## 2011-08-03 DIAGNOSIS — C20 Malignant neoplasm of rectum: Secondary | ICD-10-CM

## 2011-08-03 DIAGNOSIS — C189 Malignant neoplasm of colon, unspecified: Secondary | ICD-10-CM | POA: Insufficient documentation

## 2011-08-03 LAB — URINALYSIS, DIPSTICK ONLY
Bilirubin Urine: NEGATIVE
Ketones, ur: NEGATIVE mg/dL
Nitrite: NEGATIVE
Urobilinogen, UA: 0.2 mg/dL (ref 0.0–1.0)

## 2011-08-03 LAB — CEA: CEA: 1.1 ng/mL (ref 0.0–5.0)

## 2011-08-03 LAB — COMPREHENSIVE METABOLIC PANEL
Albumin: 3.1 g/dL — ABNORMAL LOW (ref 3.5–5.2)
BUN: 8 mg/dL (ref 6–23)
Creatinine, Ser: 1.16 mg/dL (ref 0.50–1.35)
GFR calc Af Amer: 78 mL/min — ABNORMAL LOW (ref >90)
Glucose, Bld: 150 mg/dL — ABNORMAL HIGH (ref 70–99)
Total Bilirubin: 0.8 mg/dL (ref 0.3–1.2)
Total Protein: 6.4 g/dL (ref 6.0–8.3)

## 2011-08-03 LAB — CBC
Hemoglobin: 13.1 g/dL (ref 13.0–17.0)
MCH: 30.2 pg (ref 26.0–34.0)
Platelets: 40 10*3/uL — ABNORMAL LOW (ref 150–400)
RBC: 4.34 MIL/uL (ref 4.22–5.81)
WBC: 2.3 10*3/uL — ABNORMAL LOW (ref 4.0–10.5)

## 2011-08-03 LAB — DIFFERENTIAL
Basophils Relative: 0 % (ref 0–1)
Lymphs Abs: 0.4 10*3/uL — ABNORMAL LOW (ref 0.7–4.0)
Monocytes Relative: 12 % (ref 3–12)
Neutro Abs: 1.7 10*3/uL (ref 1.7–7.7)
Neutrophils Relative %: 72 % (ref 43–77)

## 2011-08-03 MED ORDER — HEPARIN SOD (PORK) LOCK FLUSH 100 UNIT/ML IV SOLN
500.0000 [IU] | Freq: Once | INTRAVENOUS | Status: AC | PRN
  Administered 2011-08-03: 500 [IU]
  Filled 2011-08-03: qty 5

## 2011-08-03 MED ORDER — SODIUM CHLORIDE 0.9 % IJ SOLN
10.0000 mL | INTRAMUSCULAR | Status: DC | PRN
  Administered 2011-08-03: 10 mL
  Filled 2011-08-03: qty 10

## 2011-08-03 MED ORDER — HEPARIN SOD (PORK) LOCK FLUSH 100 UNIT/ML IV SOLN
INTRAVENOUS | Status: AC
  Administered 2011-08-03: 500 [IU]
  Filled 2011-08-03: qty 5

## 2011-08-03 MED ORDER — SODIUM CHLORIDE 0.9 % IJ SOLN
10.0000 mL | INTRAMUSCULAR | Status: DC | PRN
  Filled 2011-08-03: qty 10

## 2011-08-03 MED ORDER — SODIUM CHLORIDE 0.9 % IV SOLN
Freq: Once | INTRAVENOUS | Status: AC
  Administered 2011-08-03: 12:00:00 via INTRAVENOUS

## 2011-08-03 MED ORDER — SODIUM CHLORIDE 0.9 % IJ SOLN
INTRAMUSCULAR | Status: AC
  Administered 2011-08-03: 10 mL
  Filled 2011-08-03: qty 10

## 2011-08-03 MED ORDER — SODIUM CHLORIDE 0.9 % IV SOLN
5.0000 mg/kg | Freq: Once | INTRAVENOUS | Status: AC
  Administered 2011-08-03: 450 mg via INTRAVENOUS
  Filled 2011-08-03: qty 18

## 2011-08-05 ENCOUNTER — Encounter (HOSPITAL_COMMUNITY): Payer: Managed Care, Other (non HMO)

## 2011-08-10 ENCOUNTER — Other Ambulatory Visit (HOSPITAL_COMMUNITY): Payer: Managed Care, Other (non HMO)

## 2011-08-10 ENCOUNTER — Ambulatory Visit (HOSPITAL_COMMUNITY): Payer: Managed Care, Other (non HMO) | Admitting: Oncology

## 2011-08-10 ENCOUNTER — Encounter (HOSPITAL_COMMUNITY): Payer: Self-pay | Admitting: Oncology

## 2011-08-10 ENCOUNTER — Encounter (HOSPITAL_BASED_OUTPATIENT_CLINIC_OR_DEPARTMENT_OTHER): Payer: Managed Care, Other (non HMO) | Admitting: Oncology

## 2011-08-10 ENCOUNTER — Encounter (HOSPITAL_COMMUNITY): Payer: Managed Care, Other (non HMO)

## 2011-08-10 ENCOUNTER — Encounter (HOSPITAL_BASED_OUTPATIENT_CLINIC_OR_DEPARTMENT_OTHER): Payer: Managed Care, Other (non HMO)

## 2011-08-10 VITALS — BP 129/87 | HR 87 | Temp 98.4°F | Wt 220.9 lb

## 2011-08-10 DIAGNOSIS — E538 Deficiency of other specified B group vitamins: Secondary | ICD-10-CM

## 2011-08-10 DIAGNOSIS — C20 Malignant neoplasm of rectum: Secondary | ICD-10-CM

## 2011-08-10 DIAGNOSIS — C189 Malignant neoplasm of colon, unspecified: Secondary | ICD-10-CM

## 2011-08-10 DIAGNOSIS — C7800 Secondary malignant neoplasm of unspecified lung: Secondary | ICD-10-CM

## 2011-08-10 DIAGNOSIS — C787 Secondary malignant neoplasm of liver and intrahepatic bile duct: Secondary | ICD-10-CM

## 2011-08-10 LAB — DIFFERENTIAL
Basophils Absolute: 0 10*3/uL (ref 0.0–0.1)
Lymphocytes Relative: 30 % (ref 12–46)
Lymphs Abs: 0.7 10*3/uL (ref 0.7–4.0)
Neutrophils Relative %: 39 % — ABNORMAL LOW (ref 43–77)

## 2011-08-10 LAB — CBC
Platelets: 68 10*3/uL — ABNORMAL LOW (ref 150–400)
RBC: 4.78 MIL/uL (ref 4.22–5.81)
WBC: 2.2 10*3/uL — ABNORMAL LOW (ref 4.0–10.5)

## 2011-08-10 MED ORDER — CYANOCOBALAMIN 1000 MCG/ML IJ SOLN
INTRAMUSCULAR | Status: AC
  Filled 2011-08-10: qty 1

## 2011-08-10 MED ORDER — CYANOCOBALAMIN 1000 MCG/ML IJ SOLN
1000.0000 ug | Freq: Once | INTRAMUSCULAR | Status: AC
  Administered 2011-08-10: 1000 ug via INTRAMUSCULAR

## 2011-08-10 MED ORDER — HEPARIN SOD (PORK) LOCK FLUSH 100 UNIT/ML IV SOLN
INTRAVENOUS | Status: AC
  Filled 2011-08-10: qty 5

## 2011-08-10 MED ORDER — SODIUM CHLORIDE 0.9 % IJ SOLN
INTRAMUSCULAR | Status: AC
  Filled 2011-08-10: qty 10

## 2011-08-10 MED ORDER — HEPARIN SOD (PORK) LOCK FLUSH 100 UNIT/ML IV SOLN
500.0000 [IU] | Freq: Once | INTRAVENOUS | Status: DC
  Filled 2011-08-10: qty 5

## 2011-08-10 MED ORDER — SODIUM CHLORIDE 0.9 % IJ SOLN
10.0000 mL | INTRAMUSCULAR | Status: DC | PRN
  Administered 2011-08-10: 10 mL via INTRAVENOUS
  Filled 2011-08-10: qty 10

## 2011-08-10 NOTE — Progress Notes (Signed)
[name], MD, MD [address] [place] [address]  1. Adenocarcinoma of rectum  CBC, Differential    CURRENT THERAPY:S/P 7 cycles of FOLFOX + Avastinchemotherapy. Oxaliplatin was reduced by 50% and 5-FU continuous infusion is reduced by 75%.    INTERVAL HISTORY: [name] 61 y.o. male returns for  regular  visit for followup of recurrent metastatic rectal cancer.  The patien tis here for follow-up.  Last week, his chemotherapy was held due to a platelet count < 50,000.  He did however, still get his Avastin.  Today, he was due to pursue FOLFOX chemotherapy pending lab results.  His ANC is only 900, therefore we will defer treatment by one more week.  I personally reviewed and went over laboratory results with the patient.  The patient understands this.  He denies any complaints.  His appetite is strong.  His CEA continues to decline.  ROS: No TIA's or unusual headaches, no dysphagia.  No prolonged cough. No dyspnea or chest pain on exertion.  No abdominal pain, change in bowel habits, black or bloody stools.  No urinary tract symptoms.  No new or unusual musculoskeletal symptoms.      Past Medical History  Diagnosis Date  . Cancer     Colon ca dx 2008 surg/rad/chemo  . Pancytopenia, acquired 12/04/2010  . B12 deficiency 12/07/2010  . Lung cancer   . Hypertension   . Diabetes mellitus   . Hypercholesteremia     has DIABETES MELLITUS, TYPE II; HYPERCHOLESTEROLEMIA; ANEMIA, CHRONIC; CORONARY ARTERY DISEASE; SLEEP APNEA; Adenocarcinoma of rectum; Pancytopenia, acquired; and B12 deficiency on his problem list.     is allergic to daypro.  Mr. Holleman does not currently have medications on file.  Past Surgical History  Procedure Date  . Ileostomy 12/07/2010    Procedure: ILEOSTOMY;  Surgeon: [name];  Location: [facility];  Service: General;  Laterality: N/A;  Diverting Ileostomy Lysis of adhesions Exploratory Laparotomy  . Lung biopsy    rt lobe  . Cholecystectomy     Denies any headaches, dizziness, double vision, fevers, chills, night sweats, nausea, vomiting, diarrhea, constipation, chest pain, heart palpitations, shortness of breath, blood in stool, black tarry stool, urinary pain, urinary burning, urinary frequency, hematuria.   PHYSICAL EXAMINATION  ECOG PERFORMANCE STATUS: 1 - Symptomatic but completely ambulatory  Filed Vitals:   08/10/11 0955  BP: 129/87  Pulse: 87  Temp: 98.4 F (36.9 C)    GENERAL:alert, no distress, well nourished, well developed, comfortable, cooperative, obese and smiling SKIN: skin color, texture, turgor are normal, no rashes or significant lesions HEAD: Normocephalic, No masses, lesions, tenderness or abnormalities EYES: normal EARS: External ears normal OROPHARYNX:mucous membranes are moist  NECK: supple, trachea midline LYMPH:  no palpable lymphadenopathy BREAST:not examined LUNGS: clear to auscultation and percussion HEART: regular rate & rhythm, no murmurs, no gallops, S1 normal and S2 normal ABDOMEN:abdomen soft, non-tender, obese, normal bowel sounds and colostomy producing stool appropriately BACK: Back symmetric, no curvature., No CVA tenderness EXTREMITIES:less then 2 second capillary refill, no joint deformities, effusion, or inflammation, no edema, no skin discoloration, no clubbing, no cyanosis  NEURO: alert & oriented x 3 with fluent speech, no focal motor/sensory deficits, gait normal   LABORATORY DATA: CBC    Component Value Date/Time   WBC 2.2* 08/10/2011 0947   RBC 4.78 08/10/2011 0947   HGB 14.6 08/10/2011 0947   HCT 44.1 08/10/2011 0947   PLT 68* 08/10/2011 0947  MCV 92.3 08/10/2011 0947   MCH 30.5 08/10/2011 0947   MCHC 33.1 08/10/2011 0947   RDW 14.8 08/10/2011 0947   LYMPHSABS 0.7 08/10/2011 0947   MONOABS 0.6 08/10/2011 0947   EOSABS 0.1 08/10/2011 0947   BASOSABS 0.0 08/10/2011 0947      Chemistry      Component Value Date/Time   NA 140 08/03/2011  1059   K 3.5 08/03/2011 1059   CL 106 08/03/2011 1059   CO2 25 08/03/2011 1059   BUN 8 08/03/2011 1059   CREATININE 1.16 08/03/2011 1059      Component Value Date/Time   CALCIUM 9.3 08/03/2011 1059   ALKPHOS 84 08/03/2011 1059   AST 28 08/03/2011 1059   ALT 39 08/03/2011 1059   BILITOT 0.8 08/03/2011 1059     Results for [name], [name] (MRN [id number]) as of 08/10/2011 11:10  Ref. Range 06/15/2011 08:21 07/13/2011 10:41 08/03/2011 10:59  CEA Latest Range: 0.0-5.0 ng/mL 2.7 1.3 1.1      ASSESSMENT:  1. 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 S/P 6 cycles of FOLFOX + Avastinchemotherapy. Oxaliplatin was reduced by 50% and 5-FU continuous infusion is reduced by 75%. Most recent PET scan had a mixed response with liver and anastomic regions noted to be less hypermetabolic, but there is a slight increase in size of the right upper lobe lung lesion.  2. Thrombocytopenia, secondary to chemotherapy administration. Will follow closely.  Will hold chemotherapy for a platelet count < 50,000 3. Neutropenic, ANC 900, will hold chemotherapy today and defer for 1 week. 4. B12 Deficiency   PLAN:  1. Lab work performed today.  Platelets are adequate for treatment, but ANC 900. 2. Defer treatment x 7 days.  This was changed in the treatment plan. 3. Cycle 8 of FOLFOX + Avastin next week. 4. Return in 1 month for follow-up   All questions were answered. The patient knows to call the clinic with any problems, questions or concerns. We can certainly see the patient much sooner if necessary.  The patient and plan discussed with Glenford Peers, MD and he is in agreement with the aforementioned.  [name]

## 2011-08-10 NOTE — Progress Notes (Signed)
[name] presented for Portacath access and flush. Proper placement of portacath confirmed by CXR. Portacath located lt chest wall accessed with  H 20 needle. Good blood return present Portacath flushed with 20ml NS and 500U/28ml Heparin and needle removed intact. Procedure without incident. Patient tolerated procedure well.  [name] presents today for injection per MD orders. B12 1000 mcg administeredi m in left Upper Arm. Administration without incident. Patient tolerated well.

## 2011-08-10 NOTE — Patient Instructions (Signed)
[facility]  Discharge Instructions  RECOMMENDATIONS MADE BY THE CONSULTANT AND ANY TEST RESULTS WILL BE SENT TO YOUR REFERRING DOCTOR.   Chemotherapy deferred for 1 more week. That will put you back on schedule to receive chemotherapy and Avastin on the same day. RTC in 1 month to see MD.   I acknowledge that I have been informed and understand all the instructions given to me and received a copy. I do not have any more questions at this time, but understand that I may call the Specialty Clinic at [facility] at [phone] during business hours should I have any further questions or need assistance in obtaining follow-up care.    __________________________________________  _____________  __________ Signature of Patient or Authorized Representative            Date                   Time    __________________________________________ Nurse's Signature

## 2011-08-10 NOTE — Progress Notes (Signed)
Chemo held due to low wbc

## 2011-08-12 ENCOUNTER — Encounter (HOSPITAL_COMMUNITY): Payer: Managed Care, Other (non HMO)

## 2011-08-17 ENCOUNTER — Encounter (HOSPITAL_BASED_OUTPATIENT_CLINIC_OR_DEPARTMENT_OTHER): Payer: Managed Care, Other (non HMO)

## 2011-08-17 VITALS — BP 125/88 | HR 83 | Temp 98.1°F | Wt 220.4 lb

## 2011-08-17 DIAGNOSIS — Z5111 Encounter for antineoplastic chemotherapy: Secondary | ICD-10-CM

## 2011-08-17 DIAGNOSIS — C7800 Secondary malignant neoplasm of unspecified lung: Secondary | ICD-10-CM

## 2011-08-17 DIAGNOSIS — C20 Malignant neoplasm of rectum: Secondary | ICD-10-CM

## 2011-08-17 DIAGNOSIS — C787 Secondary malignant neoplasm of liver and intrahepatic bile duct: Secondary | ICD-10-CM

## 2011-08-17 LAB — COMPREHENSIVE METABOLIC PANEL
ALT: 18 U/L (ref 0–53)
Alkaline Phosphatase: 95 U/L (ref 39–117)
BUN: 9 mg/dL (ref 6–23)
Chloride: 102 mEq/L (ref 96–112)
GFR calc Af Amer: 70 mL/min — ABNORMAL LOW (ref >90)
Glucose, Bld: 90 mg/dL (ref 70–99)
Potassium: 4 mEq/L (ref 3.5–5.1)
Sodium: 138 mEq/L (ref 135–145)
Total Bilirubin: 1 mg/dL (ref 0.3–1.2)
Total Protein: 7.3 g/dL (ref 6.0–8.3)

## 2011-08-17 LAB — CBC
Hemoglobin: 14.8 g/dL (ref 13.0–17.0)
MCH: 30.2 pg (ref 26.0–34.0)
Platelets: 65 10*3/uL — ABNORMAL LOW (ref 150–400)
RBC: 4.9 MIL/uL (ref 4.22–5.81)
WBC: 4.3 10*3/uL (ref 4.0–10.5)

## 2011-08-17 LAB — DIFFERENTIAL
Eosinophils Absolute: 0 10*3/uL (ref 0.0–0.7)
Lymphocytes Relative: 15 % (ref 12–46)
Lymphs Abs: 0.7 10*3/uL (ref 0.7–4.0)
Monocytes Relative: 12 % (ref 3–12)
Neutro Abs: 3 10*3/uL (ref 1.7–7.7)
Neutrophils Relative %: 72 % (ref 43–77)

## 2011-08-17 MED ORDER — DEXTROSE 5 % IV BOLUS
50.0000 mL | INTRAVENOUS | Status: AC
  Filled 2011-08-17 (×2): qty 50

## 2011-08-17 MED ORDER — SODIUM CHLORIDE 0.9 % IV SOLN
Freq: Once | INTRAVENOUS | Status: AC
  Administered 2011-08-17: 8 mg via INTRAVENOUS
  Filled 2011-08-17: qty 4

## 2011-08-17 MED ORDER — LEUCOVORIN CALCIUM INJECTION 100 MG
44.0000 mg | Freq: Once | INTRAMUSCULAR | Status: AC
  Administered 2011-08-17: 44 mg via INTRAVENOUS
  Filled 2011-08-17: qty 2.2

## 2011-08-17 MED ORDER — FLUOROURACIL CHEMO INJECTION 5 GM/100ML
1800.0000 mg/m2 | INTRAVENOUS | Status: DC
  Administered 2011-08-17: 3800 mg via INTRAVENOUS
  Filled 2011-08-17 (×2): qty 76

## 2011-08-17 MED ORDER — SODIUM CHLORIDE 0.9 % IJ SOLN
10.0000 mL | INTRAMUSCULAR | Status: DC | PRN
  Administered 2011-08-17: 10 mL
  Filled 2011-08-17: qty 10

## 2011-08-17 MED ORDER — FLUOROURACIL CHEMO INJECTION 2.5 GM/50ML
400.0000 mg/m2 | Freq: Once | INTRAVENOUS | Status: AC
  Administered 2011-08-17: 850 mg via INTRAVENOUS
  Filled 2011-08-17: qty 17

## 2011-08-17 MED ORDER — OXALIPLATIN CHEMO INJECTION 100 MG/20ML
42.5000 mg/m2 | Freq: Once | INTRAVENOUS | Status: AC
  Administered 2011-08-17: 90 mg via INTRAVENOUS
  Filled 2011-08-17: qty 18

## 2011-08-17 MED ORDER — SODIUM CHLORIDE 0.9 % IV SOLN
Freq: Once | INTRAVENOUS | Status: AC
  Administered 2011-08-17: 11:00:00 via INTRAVENOUS

## 2011-08-17 MED ORDER — DEXAMETHASONE SODIUM PHOSPHATE 10 MG/ML IJ SOLN: 10.0000 mg | Freq: Once | INTRAMUSCULAR | Status: DC

## 2011-08-17 MED ORDER — SODIUM CHLORIDE 0.9 % IV SOLN
5.0000 mg/kg | Freq: Once | INTRAVENOUS | Status: AC
  Administered 2011-08-17: 500 mg via INTRAVENOUS
  Filled 2011-08-17 (×2): qty 20

## 2011-08-17 MED ORDER — SODIUM CHLORIDE 0.9 % IV SOLN: 8.0000 mg | Freq: Once | INTRAVENOUS | Status: DC

## 2011-08-17 MED ORDER — LEUCOVORIN CALCIUM INJECTION 350 MG: 400.0000 mg/m2 | Freq: Once | INTRAVENOUS | Status: DC

## 2011-08-17 NOTE — Progress Notes (Signed)
Tolerated chemo well today. 

## 2011-08-19 ENCOUNTER — Encounter (HOSPITAL_BASED_OUTPATIENT_CLINIC_OR_DEPARTMENT_OTHER): Payer: Managed Care, Other (non HMO)

## 2011-08-19 DIAGNOSIS — C20 Malignant neoplasm of rectum: Secondary | ICD-10-CM

## 2011-08-19 MED ORDER — SODIUM CHLORIDE 0.9 % IJ SOLN
10.0000 mL | INTRAMUSCULAR | Status: DC | PRN
  Filled 2011-08-19: qty 10

## 2011-08-19 MED ORDER — HEPARIN SOD (PORK) LOCK FLUSH 100 UNIT/ML IV SOLN
500.0000 [IU] | Freq: Once | INTRAVENOUS | Status: DC | PRN
  Filled 2011-08-19: qty 5

## 2011-08-19 MED ORDER — SODIUM CHLORIDE 0.9 % IJ SOLN
INTRAMUSCULAR | Status: AC
  Filled 2011-08-19: qty 20

## 2011-08-19 MED ORDER — HEPARIN SOD (PORK) LOCK FLUSH 100 UNIT/ML IV SOLN
INTRAVENOUS | Status: AC
  Filled 2011-08-19: qty 5

## 2011-08-31 ENCOUNTER — Encounter (HOSPITAL_COMMUNITY): Payer: Managed Care, Other (non HMO) | Attending: Oncology

## 2011-08-31 VITALS — BP 133/89 | HR 59 | Temp 97.8°F | Wt 223.4 lb

## 2011-08-31 DIAGNOSIS — Z5111 Encounter for antineoplastic chemotherapy: Secondary | ICD-10-CM

## 2011-08-31 DIAGNOSIS — C7800 Secondary malignant neoplasm of unspecified lung: Secondary | ICD-10-CM

## 2011-08-31 DIAGNOSIS — E538 Deficiency of other specified B group vitamins: Secondary | ICD-10-CM | POA: Insufficient documentation

## 2011-08-31 DIAGNOSIS — C787 Secondary malignant neoplasm of liver and intrahepatic bile duct: Secondary | ICD-10-CM

## 2011-08-31 DIAGNOSIS — C20 Malignant neoplasm of rectum: Secondary | ICD-10-CM | POA: Insufficient documentation

## 2011-08-31 LAB — DIFFERENTIAL
Basophils Absolute: 0 10*3/uL (ref 0.0–0.1)
Eosinophils Relative: 2 % (ref 0–5)
Lymphocytes Relative: 21 % (ref 12–46)
Lymphs Abs: 0.6 10*3/uL — ABNORMAL LOW (ref 0.7–4.0)
Neutro Abs: 2 10*3/uL (ref 1.7–7.7)
Neutrophils Relative %: 65 % (ref 43–77)

## 2011-08-31 LAB — COMPREHENSIVE METABOLIC PANEL
ALT: 35 U/L (ref 0–53)
Alkaline Phosphatase: 91 U/L (ref 39–117)
BUN: 6 mg/dL (ref 6–23)
CO2: 26 mEq/L (ref 19–32)
GFR calc Af Amer: 71 mL/min — ABNORMAL LOW (ref >90)
GFR calc non Af Amer: 61 mL/min — ABNORMAL LOW (ref >90)
Glucose, Bld: 90 mg/dL (ref 70–99)
Potassium: 3.7 mEq/L (ref 3.5–5.1)
Sodium: 140 mEq/L (ref 135–145)
Total Bilirubin: 1.1 mg/dL (ref 0.3–1.2)

## 2011-08-31 LAB — URINALYSIS, DIPSTICK ONLY
Leukocytes, UA: NEGATIVE
Nitrite: NEGATIVE
Protein, ur: 30 mg/dL — AB
Specific Gravity, Urine: 1.03 (ref 1.005–1.030)
Urobilinogen, UA: 0.2 mg/dL (ref 0.0–1.0)

## 2011-08-31 LAB — CBC
MCV: 89.4 fL (ref 78.0–100.0)
Platelets: 45 10*3/uL — ABNORMAL LOW (ref 150–400)
RBC: 4.52 MIL/uL (ref 4.22–5.81)
RDW: 14.8 % (ref 11.5–15.5)
WBC: 3 10*3/uL — ABNORMAL LOW (ref 4.0–10.5)

## 2011-08-31 LAB — CEA: CEA: 0.9 ng/mL (ref 0.0–5.0)

## 2011-08-31 MED ORDER — FLUOROURACIL CHEMO INJECTION 2.5 GM/50ML
400.0000 mg/m2 | Freq: Once | INTRAVENOUS | Status: AC
  Administered 2011-08-31: 850 mg via INTRAVENOUS
  Filled 2011-08-31: qty 17

## 2011-08-31 MED ORDER — SODIUM CHLORIDE 0.9 % IV SOLN
1800.0000 mg/m2 | INTRAVENOUS | Status: DC
  Administered 2011-08-31: 3800 mg via INTRAVENOUS
  Filled 2011-08-31 (×2): qty 76

## 2011-08-31 MED ORDER — SODIUM CHLORIDE 0.9 % IV SOLN
Freq: Once | INTRAVENOUS | Status: AC
  Administered 2011-08-31: 10:00:00 via INTRAVENOUS

## 2011-08-31 MED ORDER — SODIUM CHLORIDE 0.9 % IV SOLN: 8.0000 mg | Freq: Once | INTRAVENOUS | Status: DC

## 2011-08-31 MED ORDER — SODIUM CHLORIDE 0.9 % IV SOLN
Freq: Once | INTRAVENOUS | Status: AC
  Administered 2011-08-31: 8 mg via INTRAVENOUS
  Filled 2011-08-31: qty 4

## 2011-08-31 MED ORDER — DEXAMETHASONE SODIUM PHOSPHATE 10 MG/ML IJ SOLN: 10.0000 mg | Freq: Once | INTRAMUSCULAR | Status: DC

## 2011-08-31 MED ORDER — DEXTROSE 5 % IV SOLN
50.0000 mL | INTRAVENOUS | Status: AC
  Filled 2011-08-31 (×2): qty 50

## 2011-08-31 MED ORDER — BEVACIZUMAB CHEMO INJECTION 400 MG/16ML
5.0000 mg/kg | Freq: Once | INTRAVENOUS | Status: AC
  Administered 2011-08-31: 500 mg via INTRAVENOUS
  Filled 2011-08-31: qty 20

## 2011-08-31 MED ORDER — LEUCOVORIN CALCIUM INJECTION 350 MG
20.0000 mg/m2 | Freq: Once | INTRAMUSCULAR | Status: AC
  Administered 2011-08-31: 42 mg via INTRAVENOUS
  Filled 2011-08-31: qty 2.1

## 2011-08-31 MED ORDER — OXALIPLATIN CHEMO INJECTION 100 MG/20ML
42.5000 mg/m2 | Freq: Once | INTRAVENOUS | Status: AC
  Administered 2011-08-31: 90 mg via INTRAVENOUS
  Filled 2011-08-31: qty 18

## 2011-08-31 MED ORDER — DEXTROSE 5 % IV SOLN
Freq: Once | INTRAVENOUS | Status: AC
  Administered 2011-08-31: 12:00:00 via INTRAVENOUS
  Filled 2011-08-31: qty 1000

## 2011-09-02 ENCOUNTER — Encounter (HOSPITAL_BASED_OUTPATIENT_CLINIC_OR_DEPARTMENT_OTHER): Payer: Managed Care, Other (non HMO)

## 2011-09-02 DIAGNOSIS — Z452 Encounter for adjustment and management of vascular access device: Secondary | ICD-10-CM

## 2011-09-02 DIAGNOSIS — C787 Secondary malignant neoplasm of liver and intrahepatic bile duct: Secondary | ICD-10-CM

## 2011-09-02 DIAGNOSIS — C20 Malignant neoplasm of rectum: Secondary | ICD-10-CM

## 2011-09-02 DIAGNOSIS — C7800 Secondary malignant neoplasm of unspecified lung: Secondary | ICD-10-CM

## 2011-09-02 MED ORDER — HEPARIN SOD (PORK) LOCK FLUSH 100 UNIT/ML IV SOLN
INTRAVENOUS | Status: AC
  Filled 2011-09-02: qty 5

## 2011-09-02 MED ORDER — HEPARIN SOD (PORK) LOCK FLUSH 100 UNIT/ML IV SOLN
500.0000 [IU] | Freq: Once | INTRAVENOUS | Status: AC | PRN
  Administered 2011-09-02: 500 [IU]
  Filled 2011-09-02: qty 5

## 2011-09-02 MED ORDER — SODIUM CHLORIDE 0.9 % IJ SOLN
INTRAMUSCULAR | Status: AC
  Filled 2011-09-02: qty 10

## 2011-09-02 MED ORDER — SODIUM CHLORIDE 0.9 % IJ SOLN
10.0000 mL | INTRAMUSCULAR | Status: DC | PRN
  Administered 2011-09-02: 10 mL
  Filled 2011-09-02: qty 10

## 2011-09-09 ENCOUNTER — Ambulatory Visit (HOSPITAL_COMMUNITY): Payer: Managed Care, Other (non HMO)

## 2011-09-14 ENCOUNTER — Encounter (HOSPITAL_COMMUNITY): Payer: Managed Care, Other (non HMO)

## 2011-09-14 ENCOUNTER — Encounter (HOSPITAL_BASED_OUTPATIENT_CLINIC_OR_DEPARTMENT_OTHER): Payer: Managed Care, Other (non HMO)

## 2011-09-14 VITALS — BP 142/94 | HR 63 | Temp 97.9°F | Wt 223.4 lb

## 2011-09-14 DIAGNOSIS — E538 Deficiency of other specified B group vitamins: Secondary | ICD-10-CM

## 2011-09-14 DIAGNOSIS — Z5111 Encounter for antineoplastic chemotherapy: Secondary | ICD-10-CM

## 2011-09-14 DIAGNOSIS — Z5112 Encounter for antineoplastic immunotherapy: Secondary | ICD-10-CM

## 2011-09-14 DIAGNOSIS — C20 Malignant neoplasm of rectum: Secondary | ICD-10-CM

## 2011-09-14 LAB — DIFFERENTIAL
Basophils Absolute: 0 10*3/uL (ref 0.0–0.1)
Basophils Relative: 0 % (ref 0–1)
Eosinophils Absolute: 0 10*3/uL (ref 0.0–0.7)
Eosinophils Relative: 1 % (ref 0–5)
Lymphocytes Relative: 16 % (ref 12–46)
Monocytes Absolute: 0.6 10*3/uL (ref 0.1–1.0)

## 2011-09-14 LAB — URINALYSIS, DIPSTICK ONLY
Nitrite: NEGATIVE
Protein, ur: 100 mg/dL — AB
Specific Gravity, Urine: 1.03 (ref 1.005–1.030)
Urobilinogen, UA: 0.2 mg/dL (ref 0.0–1.0)

## 2011-09-14 LAB — CBC
HCT: 41.1 % (ref 39.0–52.0)
MCH: 29.3 pg (ref 26.0–34.0)
MCHC: 32.6 g/dL (ref 30.0–36.0)
MCV: 89.7 fL (ref 78.0–100.0)
Platelets: 46 10*3/uL — ABNORMAL LOW (ref 150–400)
RDW: 15.3 % (ref 11.5–15.5)
WBC: 3.4 10*3/uL — ABNORMAL LOW (ref 4.0–10.5)

## 2011-09-14 MED ORDER — SODIUM CHLORIDE 0.9 % IV SOLN
Freq: Once | INTRAVENOUS | Status: AC
  Administered 2011-09-14: 09:00:00 via INTRAVENOUS

## 2011-09-14 MED ORDER — SODIUM CHLORIDE 0.9 % IV SOLN
5.0000 mg/kg | Freq: Once | INTRAVENOUS | Status: AC
  Administered 2011-09-14: 500 mg via INTRAVENOUS
  Filled 2011-09-14: qty 20

## 2011-09-14 MED ORDER — SODIUM CHLORIDE 0.9 % IV SOLN: 8.0000 mg | Freq: Once | INTRAVENOUS | Status: DC

## 2011-09-14 MED ORDER — FLUOROURACIL CHEMO INJECTION 2.5 GM/50ML
400.0000 mg/m2 | Freq: Once | INTRAVENOUS | Status: AC
  Administered 2011-09-14: 850 mg via INTRAVENOUS
  Filled 2011-09-14: qty 17

## 2011-09-14 MED ORDER — CYANOCOBALAMIN 1000 MCG/ML IJ SOLN
INTRAMUSCULAR | Status: AC
  Filled 2011-09-14: qty 1

## 2011-09-14 MED ORDER — SODIUM CHLORIDE 0.9 % IV SOLN
Freq: Once | INTRAVENOUS | Status: AC
  Administered 2011-09-14: 8 mg via INTRAVENOUS
  Filled 2011-09-14: qty 4

## 2011-09-14 MED ORDER — DEXTROSE 5 % IV SOLN: Freq: Once | INTRAVENOUS | Status: DC

## 2011-09-14 MED ORDER — SODIUM CHLORIDE 0.9 % IV SOLN
1800.0000 mg/m2 | INTRAVENOUS | Status: DC
  Administered 2011-09-14: 3800 mg via INTRAVENOUS
  Filled 2011-09-14 (×2): qty 76

## 2011-09-14 MED ORDER — CYANOCOBALAMIN 1000 MCG/ML IJ SOLN
1000.0000 ug | Freq: Once | INTRAMUSCULAR | Status: AC
  Administered 2011-09-14: 1000 ug via INTRAMUSCULAR

## 2011-09-14 MED ORDER — LEUCOVORIN CALCIUM INJECTION 350 MG
20.0000 mg/m2 | Freq: Once | INTRAMUSCULAR | Status: AC
  Administered 2011-09-14: 42 mg via INTRAVENOUS
  Filled 2011-09-14: qty 2.1

## 2011-09-14 MED ORDER — SODIUM CHLORIDE 0.9 % IJ SOLN
10.0000 mL | INTRAMUSCULAR | Status: DC | PRN
  Filled 2011-09-14: qty 10

## 2011-09-14 MED ORDER — DEXTROSE 5 % IV BOLUS
50.0000 mL | INTRAVENOUS | Status: AC
  Administered 2011-09-14 (×2): 50 mL via INTRAVENOUS
  Filled 2011-09-14 (×2): qty 50

## 2011-09-14 MED ORDER — OXALIPLATIN CHEMO INJECTION 100 MG/20ML
42.5000 mg/m2 | Freq: Once | INTRAVENOUS | Status: AC
  Administered 2011-09-14: 90 mg via INTRAVENOUS
  Filled 2011-09-14: qty 18

## 2011-09-14 MED ORDER — DEXAMETHASONE SODIUM PHOSPHATE 10 MG/ML IJ SOLN: 10.0000 mg | Freq: Once | INTRAMUSCULAR | Status: DC

## 2011-09-14 MED ORDER — HEPARIN SOD (PORK) LOCK FLUSH 100 UNIT/ML IV SOLN
500.0000 [IU] | Freq: Once | INTRAVENOUS | Status: DC | PRN
  Filled 2011-09-14: qty 5

## 2011-09-14 NOTE — Progress Notes (Signed)
[name] presents today for injection per MD orders. B12 1000 mcg administered IM in right Upper Arm. Administration without incident. Patient tolerated well.  

## 2011-09-16 ENCOUNTER — Encounter (HOSPITAL_BASED_OUTPATIENT_CLINIC_OR_DEPARTMENT_OTHER): Payer: Managed Care, Other (non HMO)

## 2011-09-16 DIAGNOSIS — Z452 Encounter for adjustment and management of vascular access device: Secondary | ICD-10-CM

## 2011-09-16 DIAGNOSIS — C7800 Secondary malignant neoplasm of unspecified lung: Secondary | ICD-10-CM

## 2011-09-16 DIAGNOSIS — C20 Malignant neoplasm of rectum: Secondary | ICD-10-CM

## 2011-09-16 DIAGNOSIS — C787 Secondary malignant neoplasm of liver and intrahepatic bile duct: Secondary | ICD-10-CM

## 2011-09-16 MED ORDER — SODIUM CHLORIDE 0.9 % IJ SOLN
10.0000 mL | INTRAMUSCULAR | Status: DC | PRN
  Administered 2011-09-16: 10 mL via INTRAVENOUS
  Filled 2011-09-16: qty 10

## 2011-09-16 MED ORDER — HEPARIN SOD (PORK) LOCK FLUSH 100 UNIT/ML IV SOLN
500.0000 [IU] | Freq: Once | INTRAVENOUS | Status: AC
  Administered 2011-09-16: 500 [IU] via INTRAVENOUS
  Filled 2011-09-16: qty 5

## 2011-09-16 MED ORDER — SODIUM CHLORIDE 0.9 % IJ SOLN
INTRAMUSCULAR | Status: AC
  Filled 2011-09-16: qty 10

## 2011-09-16 MED ORDER — HEPARIN SOD (PORK) LOCK FLUSH 100 UNIT/ML IV SOLN
INTRAVENOUS | Status: AC
  Filled 2011-09-16: qty 5

## 2011-09-16 NOTE — Progress Notes (Signed)
Tolerated chemo well. 

## 2011-09-16 NOTE — Progress Notes (Signed)
Labs drawn today

## 2011-09-28 ENCOUNTER — Encounter (HOSPITAL_COMMUNITY): Payer: Managed Care, Other (non HMO) | Attending: Oncology

## 2011-09-28 ENCOUNTER — Other Ambulatory Visit (HOSPITAL_COMMUNITY): Payer: Self-pay | Admitting: Oncology

## 2011-09-28 ENCOUNTER — Telehealth (HOSPITAL_COMMUNITY): Payer: Self-pay | Admitting: Oncology

## 2011-09-28 VITALS — BP 132/82 | HR 76 | Temp 98.3°F | Wt 224.8 lb

## 2011-09-28 DIAGNOSIS — C20 Malignant neoplasm of rectum: Secondary | ICD-10-CM | POA: Insufficient documentation

## 2011-09-28 DIAGNOSIS — E538 Deficiency of other specified B group vitamins: Secondary | ICD-10-CM | POA: Insufficient documentation

## 2011-09-28 LAB — CBC
Hemoglobin: 12.8 g/dL — ABNORMAL LOW (ref 13.0–17.0)
MCHC: 32.4 g/dL (ref 30.0–36.0)
RDW: 15.8 % — ABNORMAL HIGH (ref 11.5–15.5)
WBC: 2.9 10*3/uL — ABNORMAL LOW (ref 4.0–10.5)

## 2011-09-28 LAB — COMPREHENSIVE METABOLIC PANEL
ALT: 28 U/L (ref 0–53)
Albumin: 3.2 g/dL — ABNORMAL LOW (ref 3.5–5.2)
Alkaline Phosphatase: 94 U/L (ref 39–117)
Potassium: 3.5 mEq/L (ref 3.5–5.1)
Sodium: 138 mEq/L (ref 135–145)
Total Protein: 6.5 g/dL (ref 6.0–8.3)

## 2011-09-28 LAB — DIFFERENTIAL
Basophils Absolute: 0 10*3/uL (ref 0.0–0.1)
Basophils Relative: 0 % (ref 0–1)
Lymphocytes Relative: 15 % (ref 12–46)
Monocytes Relative: 16 % — ABNORMAL HIGH (ref 3–12)
Neutro Abs: 2 10*3/uL (ref 1.7–7.7)
Neutrophils Relative %: 68 % (ref 43–77)

## 2011-09-28 LAB — URINALYSIS, DIPSTICK ONLY
Ketones, ur: NEGATIVE mg/dL
Leukocytes, UA: NEGATIVE
Nitrite: NEGATIVE
pH: 6 (ref 5.0–8.0)

## 2011-09-28 MED ORDER — HEPARIN SOD (PORK) LOCK FLUSH 100 UNIT/ML IV SOLN
250.0000 [IU] | Freq: Once | INTRAVENOUS | Status: DC | PRN
  Filled 2011-09-28: qty 5

## 2011-09-28 MED ORDER — SODIUM CHLORIDE 0.9 % IV SOLN
INTRAVENOUS | Status: DC
  Administered 2011-09-28: 500 mL via INTRAVENOUS

## 2011-09-28 MED ORDER — HEPARIN SOD (PORK) LOCK FLUSH 100 UNIT/ML IV SOLN
500.0000 [IU] | Freq: Once | INTRAVENOUS | Status: AC | PRN
  Administered 2011-09-28: 500 [IU]
  Filled 2011-09-28: qty 5

## 2011-09-28 MED ORDER — HEPARIN SOD (PORK) LOCK FLUSH 100 UNIT/ML IV SOLN
INTRAVENOUS | Status: AC
  Filled 2011-09-28: qty 5

## 2011-09-28 MED ORDER — SODIUM CHLORIDE 0.9 % IJ SOLN
INTRAMUSCULAR | Status: AC
  Filled 2011-09-28: qty 10

## 2011-09-28 MED ORDER — DEXTROSE 5 % IV SOLN
Freq: Once | INTRAVENOUS | Status: DC
  Filled 2011-09-28: qty 1000

## 2011-09-28 NOTE — Telephone Encounter (Signed)
[employer] [phone] DOES AVASTIN G9562 REQUIRE PRE CERT? PER BETH IT DOES REQUIRE AUTH. [id number] TO AUTH DEPT. [phone].

## 2011-09-28 NOTE — Progress Notes (Signed)
Chemo/avastin deferred x 1 week due to pt's platelets; rescheduled [name] for next week.

## 2011-09-29 ENCOUNTER — Other Ambulatory Visit (HOSPITAL_COMMUNITY): Payer: Self-pay | Admitting: Oncology

## 2011-09-30 ENCOUNTER — Telehealth (HOSPITAL_COMMUNITY): Payer: Self-pay | Admitting: Oncology

## 2011-09-30 ENCOUNTER — Encounter (HOSPITAL_COMMUNITY): Payer: Managed Care, Other (non HMO)

## 2011-09-30 NOTE — Telephone Encounter (Signed)
FAXED OVER AUTH REQUEST ON 5/3 FOR AVASTIN TO [employer] [phone]

## 2011-10-03 ENCOUNTER — Other Ambulatory Visit (HOSPITAL_COMMUNITY): Payer: Self-pay | Admitting: Oncology

## 2011-10-05 ENCOUNTER — Encounter (HOSPITAL_BASED_OUTPATIENT_CLINIC_OR_DEPARTMENT_OTHER): Payer: Managed Care, Other (non HMO)

## 2011-10-05 ENCOUNTER — Telehealth (HOSPITAL_COMMUNITY): Payer: Self-pay | Admitting: Oncology

## 2011-10-05 VITALS — BP 122/83 | HR 68 | Temp 97.5°F

## 2011-10-05 DIAGNOSIS — Z5112 Encounter for antineoplastic immunotherapy: Secondary | ICD-10-CM

## 2011-10-05 DIAGNOSIS — C20 Malignant neoplasm of rectum: Secondary | ICD-10-CM

## 2011-10-05 DIAGNOSIS — Z5111 Encounter for antineoplastic chemotherapy: Secondary | ICD-10-CM

## 2011-10-05 LAB — CBC
HCT: 43.2 % (ref 39.0–52.0)
MCH: 29.3 pg (ref 26.0–34.0)
MCHC: 32.4 g/dL (ref 30.0–36.0)
MCV: 90.4 fL (ref 78.0–100.0)
RDW: 16.6 % — ABNORMAL HIGH (ref 11.5–15.5)

## 2011-10-05 LAB — DIFFERENTIAL
Basophils Absolute: 0 10*3/uL (ref 0.0–0.1)
Basophils Relative: 0 % (ref 0–1)
Eosinophils Absolute: 0.1 10*3/uL (ref 0.0–0.7)
Eosinophils Relative: 2 % (ref 0–5)
Monocytes Absolute: 0.6 10*3/uL (ref 0.1–1.0)

## 2011-10-05 MED ORDER — SODIUM CHLORIDE 0.9 % IJ SOLN
10.0000 mL | INTRAMUSCULAR | Status: DC | PRN
  Administered 2011-10-05 (×2): 10 mL
  Filled 2011-10-05: qty 10

## 2011-10-05 MED ORDER — FLUOROURACIL CHEMO INJECTION 2.5 GM/50ML
400.0000 mg/m2 | Freq: Once | INTRAVENOUS | Status: AC
  Administered 2011-10-05: 850 mg via INTRAVENOUS
  Filled 2011-10-05: qty 17

## 2011-10-05 MED ORDER — SODIUM CHLORIDE 0.9 % IV SOLN
Freq: Once | INTRAVENOUS | Status: AC
  Administered 2011-10-05: 8 mg via INTRAVENOUS
  Filled 2011-10-05: qty 4

## 2011-10-05 MED ORDER — SODIUM CHLORIDE 0.9 % IJ SOLN
INTRAMUSCULAR | Status: AC
  Filled 2011-10-05: qty 10

## 2011-10-05 MED ORDER — DEXTROSE 5 % IV SOLN
50.0000 mL | INTRAVENOUS | Status: AC
  Filled 2011-10-05 (×2): qty 50

## 2011-10-05 MED ORDER — SODIUM CHLORIDE 0.9 % IV SOLN
1800.0000 mg/m2 | INTRAVENOUS | Status: DC
  Administered 2011-10-05: 3800 mg via INTRAVENOUS
  Filled 2011-10-05 (×2): qty 76

## 2011-10-05 MED ORDER — OXALIPLATIN CHEMO INJECTION 100 MG/20ML
42.5000 mg/m2 | Freq: Once | INTRAVENOUS | Status: AC
  Administered 2011-10-05: 90 mg via INTRAVENOUS
  Filled 2011-10-05: qty 18

## 2011-10-05 MED ORDER — LEUCOVORIN CALCIUM INJECTION 100 MG
20.0000 mg/m2 | Freq: Once | INTRAMUSCULAR | Status: AC
  Administered 2011-10-05: 42 mg via INTRAVENOUS
  Filled 2011-10-05: qty 2.1

## 2011-10-05 MED ORDER — SODIUM CHLORIDE 0.9 % IV SOLN
Freq: Once | INTRAVENOUS | Status: AC
  Administered 2011-10-05: 11:00:00 via INTRAVENOUS

## 2011-10-05 MED ORDER — LEUCOVORIN CALCIUM INJECTION 350 MG: 400.0000 mg/m2 | Freq: Once | INTRAVENOUS | Status: DC

## 2011-10-05 MED ORDER — SODIUM CHLORIDE 0.9 % IV SOLN
5.0000 mg/kg | Freq: Once | INTRAVENOUS | Status: AC
  Administered 2011-10-05: 500 mg via INTRAVENOUS
  Filled 2011-10-05: qty 20

## 2011-10-05 MED ORDER — DEXAMETHASONE SODIUM PHOSPHATE 10 MG/ML IJ SOLN: 10.0000 mg | Freq: Once | INTRAMUSCULAR | Status: DC

## 2011-10-05 MED ORDER — SODIUM CHLORIDE 0.9 % IJ SOLN
10.0000 mL | INTRAMUSCULAR | Status: DC | PRN
  Filled 2011-10-05: qty 10

## 2011-10-05 MED ORDER — HEPARIN SOD (PORK) LOCK FLUSH 100 UNIT/ML IV SOLN
INTRAVENOUS | Status: AC
  Filled 2011-10-05: qty 5

## 2011-10-05 MED ORDER — SODIUM CHLORIDE 0.9 % IV SOLN: 8.0000 mg | Freq: Once | INTRAVENOUS | Status: DC

## 2011-10-05 NOTE — Telephone Encounter (Signed)
PER [employer]  AVASTIN 400MG /16 ML 1300 UNITS HAS BEEN APPRVD. AUTH# [id number] 10/05/11-10/04/12

## 2011-10-05 NOTE — Progress Notes (Signed)
Port accessed per protocol and labs for cbc diff and cmet obtained Instructions given from Dr. [name] to go ahead with treatment today after labs reviewed. Tolerated well.

## 2011-10-07 ENCOUNTER — Encounter (HOSPITAL_BASED_OUTPATIENT_CLINIC_OR_DEPARTMENT_OTHER): Payer: Managed Care, Other (non HMO)

## 2011-10-07 DIAGNOSIS — Z452 Encounter for adjustment and management of vascular access device: Secondary | ICD-10-CM

## 2011-10-07 DIAGNOSIS — C20 Malignant neoplasm of rectum: Secondary | ICD-10-CM

## 2011-10-07 MED ORDER — HEPARIN SOD (PORK) LOCK FLUSH 100 UNIT/ML IV SOLN
500.0000 [IU] | Freq: Once | INTRAVENOUS | Status: AC | PRN
  Administered 2011-10-07: 500 [IU]
  Filled 2011-10-07: qty 5

## 2011-10-07 MED ORDER — SODIUM CHLORIDE 0.9 % IJ SOLN
10.0000 mL | INTRAMUSCULAR | Status: DC | PRN
  Administered 2011-10-07: 10 mL
  Filled 2011-10-07: qty 10

## 2011-10-07 MED ORDER — HEPARIN SOD (PORK) LOCK FLUSH 100 UNIT/ML IV SOLN
INTRAVENOUS | Status: AC
  Filled 2011-10-07: qty 5

## 2011-10-07 MED ORDER — SODIUM CHLORIDE 0.9 % IJ SOLN
INTRAMUSCULAR | Status: AC
  Filled 2011-10-07: qty 10

## 2011-10-07 NOTE — Progress Notes (Signed)
[name] presented for Portacath deaccess and pump d/c and flush. Has tolerated chemo without problems  Portacath flushed with 20ml NS and 500U/75ml Heparin and needle removed intact. Procedure without incident. Patient tolerated procedure well.

## 2011-10-10 ENCOUNTER — Encounter (HOSPITAL_BASED_OUTPATIENT_CLINIC_OR_DEPARTMENT_OTHER): Payer: Managed Care, Other (non HMO)

## 2011-10-10 VITALS — BP 126/82 | HR 82

## 2011-10-10 DIAGNOSIS — E538 Deficiency of other specified B group vitamins: Secondary | ICD-10-CM

## 2011-10-10 MED ORDER — CYANOCOBALAMIN 1000 MCG/ML IJ SOLN
INTRAMUSCULAR | Status: AC
  Filled 2011-10-10: qty 1

## 2011-10-10 MED ORDER — CYANOCOBALAMIN 1000 MCG/ML IJ SOLN
1000.0000 ug | Freq: Once | INTRAMUSCULAR | Status: AC
  Administered 2011-10-10: 1000 ug via INTRAMUSCULAR

## 2011-10-10 NOTE — Progress Notes (Signed)
[name] presents today for injection per MD orders. B12 1000 mcg administered IM in right Upper Arm. Administration without incident. Patient tolerated well.  

## 2011-10-18 ENCOUNTER — Inpatient Hospital Stay (HOSPITAL_COMMUNITY): Payer: Managed Care, Other (non HMO)

## 2011-10-19 ENCOUNTER — Encounter (HOSPITAL_BASED_OUTPATIENT_CLINIC_OR_DEPARTMENT_OTHER): Payer: Managed Care, Other (non HMO)

## 2011-10-19 ENCOUNTER — Encounter (HOSPITAL_COMMUNITY): Payer: Managed Care, Other (non HMO)

## 2011-10-19 DIAGNOSIS — C20 Malignant neoplasm of rectum: Secondary | ICD-10-CM

## 2011-10-19 LAB — COMPREHENSIVE METABOLIC PANEL
ALT: 31 U/L (ref 0–53)
AST: 27 U/L (ref 0–37)
CO2: 25 mEq/L (ref 19–32)
Chloride: 104 mEq/L (ref 96–112)
GFR calc non Af Amer: 63 mL/min — ABNORMAL LOW (ref >90)
Potassium: 4 mEq/L (ref 3.5–5.1)
Sodium: 137 mEq/L (ref 135–145)
Total Bilirubin: 1.7 mg/dL — ABNORMAL HIGH (ref 0.3–1.2)

## 2011-10-19 LAB — CBC
HCT: 41.7 % (ref 39.0–52.0)
MCHC: 32.4 g/dL (ref 30.0–36.0)
RDW: 16.9 % — ABNORMAL HIGH (ref 11.5–15.5)

## 2011-10-19 LAB — DIFFERENTIAL
Basophils Absolute: 0 10*3/uL (ref 0.0–0.1)
Basophils Relative: 1 % (ref 0–1)
Eosinophils Relative: 2 % (ref 0–5)
Monocytes Absolute: 0.4 10*3/uL (ref 0.1–1.0)

## 2011-10-19 MED ORDER — SODIUM CHLORIDE 0.9 % IJ SOLN
10.0000 mL | INTRAMUSCULAR | Status: DC | PRN
  Administered 2011-10-19: 10 mL via INTRAVENOUS
  Filled 2011-10-19: qty 10

## 2011-10-19 MED ORDER — HEPARIN SOD (PORK) LOCK FLUSH 100 UNIT/ML IV SOLN
INTRAVENOUS | Status: AC
  Filled 2011-10-19: qty 5

## 2011-10-19 MED ORDER — HEPARIN SOD (PORK) LOCK FLUSH 100 UNIT/ML IV SOLN
500.0000 [IU] | Freq: Once | INTRAVENOUS | Status: AC
  Administered 2011-10-19: 500 [IU] via INTRAVENOUS
  Filled 2011-10-19: qty 5

## 2011-10-19 MED ORDER — SODIUM CHLORIDE 0.9 % IJ SOLN
INTRAMUSCULAR | Status: AC
  Filled 2011-10-19: qty 10

## 2011-10-19 NOTE — Progress Notes (Signed)
Labs obtained from port without difficulty.chemotherapy held today due to low platelets.  Will r/s for one week.

## 2011-10-21 ENCOUNTER — Encounter (HOSPITAL_COMMUNITY): Payer: Managed Care, Other (non HMO)

## 2011-10-26 ENCOUNTER — Other Ambulatory Visit (HOSPITAL_COMMUNITY): Payer: Self-pay | Admitting: Oncology

## 2011-10-26 ENCOUNTER — Encounter (HOSPITAL_BASED_OUTPATIENT_CLINIC_OR_DEPARTMENT_OTHER): Payer: Managed Care, Other (non HMO)

## 2011-10-26 VITALS — BP 118/88 | HR 83 | Temp 97.5°F | Wt 226.2 lb

## 2011-10-26 DIAGNOSIS — Z5111 Encounter for antineoplastic chemotherapy: Secondary | ICD-10-CM

## 2011-10-26 DIAGNOSIS — C20 Malignant neoplasm of rectum: Secondary | ICD-10-CM

## 2011-10-26 DIAGNOSIS — Z5112 Encounter for antineoplastic immunotherapy: Secondary | ICD-10-CM

## 2011-10-26 LAB — CBC
Hemoglobin: 13.1 g/dL (ref 13.0–17.0)
MCH: 28.7 pg (ref 26.0–34.0)
MCHC: 32 g/dL (ref 30.0–36.0)
Platelets: 73 10*3/uL — ABNORMAL LOW (ref 150–400)
RDW: 17.2 % — ABNORMAL HIGH (ref 11.5–15.5)

## 2011-10-26 LAB — URINALYSIS, DIPSTICK ONLY
Bilirubin Urine: NEGATIVE
Ketones, ur: NEGATIVE mg/dL
Leukocytes, UA: NEGATIVE
Nitrite: NEGATIVE
Protein, ur: 30 mg/dL — AB
Urobilinogen, UA: 0.2 mg/dL (ref 0.0–1.0)

## 2011-10-26 LAB — DIFFERENTIAL
Basophils Absolute: 0 10*3/uL (ref 0.0–0.1)
Basophils Relative: 1 % (ref 0–1)
Eosinophils Absolute: 0.1 10*3/uL (ref 0.0–0.7)
Monocytes Relative: 28 % — ABNORMAL HIGH (ref 3–12)
Neutro Abs: 0.7 10*3/uL — ABNORMAL LOW (ref 1.7–7.7)
Neutrophils Relative %: 36 % — ABNORMAL LOW (ref 43–77)

## 2011-10-26 MED ORDER — DEXTROSE 5 % IV SOLN
50.0000 mL | INTRAVENOUS | Status: AC
  Filled 2011-10-26 (×2): qty 50

## 2011-10-26 MED ORDER — LEUCOVORIN CALCIUM INJECTION 350 MG: 400.0000 mg/m2 | Freq: Once | INTRAVENOUS | Status: DC

## 2011-10-26 MED ORDER — SODIUM CHLORIDE 0.9 % IV SOLN: 8.0000 mg | Freq: Once | INTRAVENOUS | Status: DC

## 2011-10-26 MED ORDER — DEXAMETHASONE SODIUM PHOSPHATE 10 MG/ML IJ SOLN: 10.0000 mg | Freq: Once | INTRAMUSCULAR | Status: DC

## 2011-10-26 MED ORDER — SODIUM CHLORIDE 0.9 % IV SOLN
5.0000 mg/kg | Freq: Once | INTRAVENOUS | Status: AC
  Administered 2011-10-26: 500 mg via INTRAVENOUS
  Filled 2011-10-26: qty 20

## 2011-10-26 MED ORDER — SODIUM CHLORIDE 0.9 % IV SOLN
1800.0000 mg/m2 | INTRAVENOUS | Status: DC
  Administered 2011-10-26: 3800 mg via INTRAVENOUS
  Filled 2011-10-26 (×2): qty 76

## 2011-10-26 MED ORDER — FLUOROURACIL CHEMO INJECTION 2.5 GM/50ML
400.0000 mg/m2 | Freq: Once | INTRAVENOUS | Status: AC
  Administered 2011-10-26: 850 mg via INTRAVENOUS
  Filled 2011-10-26: qty 17

## 2011-10-26 MED ORDER — SODIUM CHLORIDE 0.9 % IV SOLN
Freq: Once | INTRAVENOUS | Status: AC
  Administered 2011-10-26: 10:00:00 via INTRAVENOUS

## 2011-10-26 MED ORDER — OXALIPLATIN CHEMO INJECTION 100 MG/20ML
42.5000 mg/m2 | Freq: Once | INTRAVENOUS | Status: AC
  Administered 2011-10-26: 90 mg via INTRAVENOUS
  Filled 2011-10-26: qty 18

## 2011-10-26 MED ORDER — DEXTROSE 5 % IV SOLN
Freq: Once | INTRAVENOUS | Status: AC
  Administered 2011-10-26: 12:00:00 via INTRAVENOUS
  Filled 2011-10-26: qty 1000

## 2011-10-26 MED ORDER — LEUCOVORIN CALCIUM INJECTION 100 MG
42.0000 mg | Freq: Once | INTRAMUSCULAR | Status: AC
  Administered 2011-10-26: 42 mg via INTRAVENOUS
  Filled 2011-10-26: qty 2.1

## 2011-10-26 MED ORDER — SODIUM CHLORIDE 0.9 % IV SOLN
Freq: Once | INTRAVENOUS | Status: AC
  Administered 2011-10-26: 8 mg via INTRAVENOUS
  Filled 2011-10-26: qty 4

## 2011-10-27 ENCOUNTER — Telehealth (HOSPITAL_COMMUNITY): Payer: Self-pay | Admitting: Oncology

## 2011-10-27 NOTE — Telephone Encounter (Signed)
[employer] [phone] ? IF CPT J2505 REQUIRES AUTH PER KENDRA(CSR) NO AUTH REQUIRED CALL [id number]

## 2011-10-28 ENCOUNTER — Encounter (HOSPITAL_BASED_OUTPATIENT_CLINIC_OR_DEPARTMENT_OTHER): Payer: Managed Care, Other (non HMO)

## 2011-10-28 VITALS — BP 121/82 | HR 65

## 2011-10-28 DIAGNOSIS — Z5189 Encounter for other specified aftercare: Secondary | ICD-10-CM

## 2011-10-28 DIAGNOSIS — C20 Malignant neoplasm of rectum: Secondary | ICD-10-CM

## 2011-10-28 MED ORDER — HEPARIN SOD (PORK) LOCK FLUSH 100 UNIT/ML IV SOLN
INTRAVENOUS | Status: AC
  Filled 2011-10-28: qty 5

## 2011-10-28 MED ORDER — PEGFILGRASTIM INJECTION 6 MG/0.6ML
SUBCUTANEOUS | Status: AC
  Filled 2011-10-28: qty 0.6

## 2011-10-28 MED ORDER — SODIUM CHLORIDE 0.9 % IJ SOLN
INTRAMUSCULAR | Status: AC
Start: 2011-10-28 — End: 2011-10-28
  Filled 2011-10-28: qty 10

## 2011-10-28 MED ORDER — HEPARIN SOD (PORK) LOCK FLUSH 100 UNIT/ML IV SOLN
500.0000 [IU] | Freq: Once | INTRAVENOUS | Status: AC
  Administered 2011-10-28: 500 [IU] via INTRAVENOUS
  Filled 2011-10-28: qty 5

## 2011-10-28 MED ORDER — PEGFILGRASTIM INJECTION 6 MG/0.6ML
6.0000 mg | Freq: Once | SUBCUTANEOUS | Status: AC
  Administered 2011-10-28: 6 mg via SUBCUTANEOUS

## 2011-10-28 MED ORDER — SODIUM CHLORIDE 0.9 % IJ SOLN
10.0000 mL | INTRAMUSCULAR | Status: DC | PRN
  Administered 2011-10-28: 10 mL via INTRAVENOUS
  Filled 2011-10-28: qty 10

## 2011-11-09 ENCOUNTER — Other Ambulatory Visit (HOSPITAL_COMMUNITY): Payer: Self-pay | Admitting: Oncology

## 2011-11-09 ENCOUNTER — Ambulatory Visit (HOSPITAL_COMMUNITY): Payer: Managed Care, Other (non HMO)

## 2011-11-09 ENCOUNTER — Encounter (HOSPITAL_COMMUNITY): Payer: Managed Care, Other (non HMO) | Attending: Oncology

## 2011-11-09 DIAGNOSIS — C20 Malignant neoplasm of rectum: Secondary | ICD-10-CM | POA: Insufficient documentation

## 2011-11-09 DIAGNOSIS — E538 Deficiency of other specified B group vitamins: Secondary | ICD-10-CM | POA: Insufficient documentation

## 2011-11-09 LAB — URINALYSIS, DIPSTICK ONLY
Glucose, UA: 100 mg/dL — AB
Ketones, ur: NEGATIVE mg/dL
Leukocytes, UA: NEGATIVE
pH: 6 (ref 5.0–8.0)

## 2011-11-09 LAB — DIFFERENTIAL
Basophils Absolute: 0 10*3/uL (ref 0.0–0.1)
Basophils Relative: 0 % (ref 0–1)
Eosinophils Absolute: 0.1 10*3/uL (ref 0.0–0.7)
Monocytes Absolute: 0.9 10*3/uL (ref 0.1–1.0)
Monocytes Relative: 18 % — ABNORMAL HIGH (ref 3–12)
Neutrophils Relative %: 66 % (ref 43–77)

## 2011-11-09 LAB — CBC
Hemoglobin: 13.1 g/dL (ref 13.0–17.0)
MCH: 29.4 pg (ref 26.0–34.0)
MCHC: 32.6 g/dL (ref 30.0–36.0)
RDW: 18 % — ABNORMAL HIGH (ref 11.5–15.5)

## 2011-11-09 LAB — CEA: CEA: 1 ng/mL (ref 0.0–5.0)

## 2011-11-09 LAB — COMPREHENSIVE METABOLIC PANEL
Albumin: 3.4 g/dL — ABNORMAL LOW (ref 3.5–5.2)
BUN: 10 mg/dL (ref 6–23)
Creatinine, Ser: 1.29 mg/dL (ref 0.50–1.35)
Potassium: 3.8 mEq/L (ref 3.5–5.1)
Total Protein: 6.8 g/dL (ref 6.0–8.3)

## 2011-11-09 MED ORDER — CYANOCOBALAMIN 1000 MCG/ML IJ SOLN
1000.0000 ug | Freq: Once | INTRAMUSCULAR | Status: AC
  Administered 2011-11-09: 1000 ug via INTRAMUSCULAR

## 2011-11-09 MED ORDER — CYANOCOBALAMIN 1000 MCG/ML IJ SOLN
INTRAMUSCULAR | Status: AC
  Filled 2011-11-09: qty 1

## 2011-11-09 MED ORDER — SODIUM CHLORIDE 0.9 % IJ SOLN
10.0000 mL | Freq: Once | INTRAMUSCULAR | Status: AC
  Administered 2011-11-09: 10 mL via INTRAVENOUS
  Filled 2011-11-09: qty 10

## 2011-11-09 MED ORDER — HEPARIN SOD (PORK) LOCK FLUSH 100 UNIT/ML IV SOLN
500.0000 [IU] | Freq: Once | INTRAVENOUS | Status: AC
  Administered 2011-11-09: 500 [IU] via INTRAVENOUS
  Filled 2011-11-09: qty 5

## 2011-11-09 NOTE — Progress Notes (Signed)
Chemo held due to platelets 38,000. Pt c/o pain left side similar to that of kidney stone pain he has had in past. Urine specimen to lab.  [name] presents today for injection per MD orders. B12 1000 mcg administered IM in right Upper Arm. Administration without incident. Patient tolerated well.

## 2011-11-10 ENCOUNTER — Ambulatory Visit (HOSPITAL_COMMUNITY): Payer: Managed Care, Other (non HMO)

## 2011-11-11 ENCOUNTER — Encounter (HOSPITAL_COMMUNITY): Payer: Managed Care, Other (non HMO)

## 2011-11-16 ENCOUNTER — Other Ambulatory Visit (HOSPITAL_COMMUNITY): Payer: Self-pay | Admitting: Oncology

## 2011-11-16 ENCOUNTER — Encounter (HOSPITAL_BASED_OUTPATIENT_CLINIC_OR_DEPARTMENT_OTHER): Payer: Managed Care, Other (non HMO)

## 2011-11-16 VITALS — BP 132/84 | HR 88 | Temp 97.2°F | Wt 226.0 lb

## 2011-11-16 DIAGNOSIS — Z5111 Encounter for antineoplastic chemotherapy: Secondary | ICD-10-CM

## 2011-11-16 DIAGNOSIS — C20 Malignant neoplasm of rectum: Secondary | ICD-10-CM

## 2011-11-16 LAB — COMPREHENSIVE METABOLIC PANEL
BUN: 10 mg/dL (ref 6–23)
CO2: 23 mEq/L (ref 19–32)
Chloride: 102 mEq/L (ref 96–112)
Creatinine, Ser: 1.23 mg/dL (ref 0.50–1.35)
GFR calc Af Amer: 73 mL/min — ABNORMAL LOW (ref >90)
GFR calc non Af Amer: 63 mL/min — ABNORMAL LOW (ref >90)
Glucose, Bld: 93 mg/dL (ref 70–99)
Total Bilirubin: 1.6 mg/dL — ABNORMAL HIGH (ref 0.3–1.2)

## 2011-11-16 LAB — CBC
HCT: 42 % (ref 39.0–52.0)
Hemoglobin: 13.6 g/dL (ref 13.0–17.0)
MCV: 91.3 fL (ref 78.0–100.0)
WBC: 5.6 10*3/uL (ref 4.0–10.5)

## 2011-11-16 LAB — DIFFERENTIAL
Eosinophils Relative: 1 % (ref 0–5)
Lymphocytes Relative: 12 % (ref 12–46)
Monocytes Absolute: 0.7 10*3/uL (ref 0.1–1.0)
Monocytes Relative: 12 % (ref 3–12)
Neutro Abs: 4.1 10*3/uL (ref 1.7–7.7)

## 2011-11-16 MED ORDER — OXALIPLATIN CHEMO INJECTION 100 MG/20ML
42.5000 mg/m2 | Freq: Once | INTRAVENOUS | Status: AC
  Administered 2011-11-16: 90 mg via INTRAVENOUS
  Filled 2011-11-16: qty 18

## 2011-11-16 MED ORDER — DEXTROSE 5 % IV SOLN
Freq: Once | INTRAVENOUS | Status: AC
  Administered 2011-11-16: 12:00:00 via INTRAVENOUS
  Filled 2011-11-16: qty 1000

## 2011-11-16 MED ORDER — FLUOROURACIL CHEMO INJECTION 2.5 GM/50ML
400.0000 mg/m2 | Freq: Once | INTRAVENOUS | Status: AC
  Administered 2011-11-16: 850 mg via INTRAVENOUS
  Filled 2011-11-16: qty 17

## 2011-11-16 MED ORDER — SODIUM CHLORIDE 0.9 % IJ SOLN
10.0000 mL | INTRAMUSCULAR | Status: DC | PRN
  Administered 2011-11-16: 10 mL
  Filled 2011-11-16: qty 10

## 2011-11-16 MED ORDER — SODIUM CHLORIDE 0.9 % IV SOLN
1800.0000 mg/m2 | INTRAVENOUS | Status: DC
  Administered 2011-11-16: 3800 mg via INTRAVENOUS
  Filled 2011-11-16 (×2): qty 76

## 2011-11-16 MED ORDER — LEUCOVORIN CALCIUM INJECTION 350 MG: 400.0000 mg/m2 | Freq: Once | INTRAMUSCULAR | Status: DC

## 2011-11-16 MED ORDER — DEXAMETHASONE SODIUM PHOSPHATE 10 MG/ML IJ SOLN: 10.0000 mg | Freq: Once | INTRAMUSCULAR | Status: DC

## 2011-11-16 MED ORDER — SODIUM CHLORIDE 0.9 % IV SOLN: 8.0000 mg | Freq: Once | INTRAVENOUS | Status: DC

## 2011-11-16 MED ORDER — SODIUM CHLORIDE 0.9 % IJ SOLN
INTRAMUSCULAR | Status: AC
  Filled 2011-11-16: qty 10

## 2011-11-16 MED ORDER — LEUCOVORIN CALCIUM INJECTION 100 MG
20.0000 mg/m2 | Freq: Once | INTRAMUSCULAR | Status: AC
  Administered 2011-11-16: 42 mg via INTRAVENOUS
  Filled 2011-11-16: qty 2.1

## 2011-11-16 MED ORDER — SODIUM CHLORIDE 0.9 % IV SOLN
5.0000 mg/kg | Freq: Once | INTRAVENOUS | Status: AC
  Administered 2011-11-16: 500 mg via INTRAVENOUS
  Filled 2011-11-16: qty 20

## 2011-11-16 MED ORDER — SODIUM CHLORIDE 0.9 % IV SOLN
Freq: Once | INTRAVENOUS | Status: AC
  Administered 2011-11-16: 8 mg via INTRAVENOUS
  Filled 2011-11-16: qty 4

## 2011-11-16 MED ORDER — SODIUM CHLORIDE 0.9 % IV SOLN
Freq: Once | INTRAVENOUS | Status: AC
  Administered 2011-11-16: 10:00:00 via INTRAVENOUS

## 2011-11-16 NOTE — Progress Notes (Signed)
Tolerated chemo well. 

## 2011-11-18 ENCOUNTER — Encounter (HOSPITAL_COMMUNITY): Payer: Managed Care, Other (non HMO)

## 2011-11-18 ENCOUNTER — Telehealth (HOSPITAL_COMMUNITY): Payer: Self-pay | Admitting: Oncology

## 2011-11-18 ENCOUNTER — Encounter (HOSPITAL_BASED_OUTPATIENT_CLINIC_OR_DEPARTMENT_OTHER): Payer: Managed Care, Other (non HMO) | Admitting: Oncology

## 2011-11-18 VITALS — BP 131/85 | HR 77 | Temp 97.9°F | Wt 227.8 lb

## 2011-11-18 DIAGNOSIS — C7800 Secondary malignant neoplasm of unspecified lung: Secondary | ICD-10-CM

## 2011-11-18 DIAGNOSIS — C787 Secondary malignant neoplasm of liver and intrahepatic bile duct: Secondary | ICD-10-CM

## 2011-11-18 DIAGNOSIS — Z5189 Encounter for other specified aftercare: Secondary | ICD-10-CM

## 2011-11-18 DIAGNOSIS — C20 Malignant neoplasm of rectum: Secondary | ICD-10-CM

## 2011-11-18 DIAGNOSIS — E538 Deficiency of other specified B group vitamins: Secondary | ICD-10-CM

## 2011-11-18 MED ORDER — SODIUM CHLORIDE 0.9 % IJ SOLN
10.0000 mL | INTRAMUSCULAR | Status: DC | PRN
  Administered 2011-11-18: 10 mL
  Filled 2011-11-18: qty 10

## 2011-11-18 MED ORDER — HEPARIN SOD (PORK) LOCK FLUSH 100 UNIT/ML IV SOLN
500.0000 [IU] | Freq: Once | INTRAVENOUS | Status: AC | PRN
  Administered 2011-11-18: 500 [IU]
  Filled 2011-11-18: qty 5

## 2011-11-18 MED ORDER — PEGFILGRASTIM INJECTION 6 MG/0.6ML
SUBCUTANEOUS | Status: AC
  Filled 2011-11-18: qty 0.6

## 2011-11-18 MED ORDER — PEGFILGRASTIM INJECTION 6 MG/0.6ML
6.0000 mg | Freq: Once | SUBCUTANEOUS | Status: AC
  Administered 2011-11-18: 6 mg via SUBCUTANEOUS

## 2011-11-18 MED ORDER — HEPARIN SOD (PORK) LOCK FLUSH 100 UNIT/ML IV SOLN
INTRAVENOUS | Status: AC
  Filled 2011-11-18: qty 5

## 2011-11-18 MED ORDER — SODIUM CHLORIDE 0.9 % IJ SOLN
INTRAMUSCULAR | Status: AC
  Filled 2011-11-18: qty 10

## 2011-11-18 NOTE — Patient Instructions (Addendum)
[facility]  Discharge Instructions  RECOMMENDATIONS MADE BY THE CONSULTANT AND ANY TEST RESULTS WILL BE SENT TO YOUR REFERRING DOCTOR.   We will continue chemotherapy as ordered. We will get you set up for a PET scan. Reports any issues/concerns as needed to this clinic.   I acknowledge that I have been informed and understand all the instructions given to me and received a copy. I do not have any more questions at this time, but understand that I may call the Specialty Clinic at [facility] at [phone] during business hours should I have any further questions or need assistance in obtaining follow-up care.    __________________________________________  _____________  __________ Signature of Patient or Authorized Representative            Date                   Time    __________________________________________ Nurse's Signature

## 2011-11-18 NOTE — Telephone Encounter (Signed)
[employer]  [phone] CALLED TO OBTAIN PRE AUTH INFO  RE: AVASTIN [id number] PER JACKIE IT WAS AUTH'D 10/05/11-10/04/12 [id number] AND THERE IS NO AUTH ONFILE PRIOR TO THOSE DATES.  CALLED BACK SPK TO KRISTINA AND ASK IF A RETRO APPEAL WAS AN OPTION FOR DATES PRIOR TO 10/05/11. SHE STATED THEY CAN ONLY RETRO BACK 14DAYS BUT AN APPEAL CAN BE SUBMITTED TO FAX#[phone].   FAXED OVER MED REC'S TO ATTN: APPEALS COORDINATOR April [employer] [facility] [phone]

## 2011-11-18 NOTE — Progress Notes (Signed)
[name], MD [address] [place] [address]  1. Adenocarcinoma of rectum  LORazepam (ATIVAN) 1 MG tablet, NM PET Image Restag (PS) Skull Base To Thigh, CBC, Differential, Comprehensive metabolic panel, Urinalysis, dipstick only, CBC, Differential, Basic metabolic panel, Urinalysis, dipstick only, CBC, Differential, Basic metabolic panel, Urinalysis, dipstick only, CEA, pegfilgrastim (NEULASTA) injection 6 mg, SCHEDULING COMMUNICATION, heparin lock flush 100 unit/mL, sodium chloride 0.9 % injection 10 mL    CURRENT THERAPY:S/P 13 cycles of FOLFOX + Avastin chemotherapy. Oxaliplatin was reduced by 50% and 5-FU continuous infusion is reduced by 75%.   INTERVAL HISTORY: [name] 61 y.o. male returns for  regular  visit for followup of recurrent metastatic rectal cancer.  The patient denies any complaints. Appetite remains very strong. He says "I can need to crap out of a steak."  His weight is up to 227 pounds compared to 220 pounds in March of 2013. He is tolerating therapy very well. He does illustrate that he continues to have cold intolerance as expected to the oxaliplatin chemotherapy. This is not affecting his quality of life. He denies any nausea or vomiting.  We set some time discussing his laboratory work.  I personally reviewed and went over laboratory results with the patient.  His CEA is within normal limits at 1.0 as of 11/09/2011.  He is due to have his 5-FU continuous infusion pump disconnected today. This will likely be discontinued within the next 30 minutes to 1 hour.  Patient is due to have restaging PET scan. His last PET scan was on 07/12/2011. This will be for restaging purposes and to see if there is a correlation between his CEA and imaging studies.  Oncologically, the patient denies any complaints. His colostomy is producing stool as expected. He denies any abdominal pain. He does report that he had a kidney stone in the past. He  reports that he believes he passed it. In the past when he had his kidney stone he had some hematuria which has now resolved. He did utilize tramadol to help control the pain from this renal calculus.  Santiago asks if we can change his chemotherapy to every 3 weeks since he is requiring being held from chemotherapy due to his counts frequently. I discouraged this and we will continue with every 2 week chemotherapy administration per his laboratory work.  Past Medical History  Diagnosis Date  . Cancer     Colon ca dx 2008 surg/rad/chemo  . Pancytopenia, acquired 12/04/2010  . B12 deficiency 12/07/2010  . Lung cancer   . Hypertension   . Diabetes mellitus   . Hypercholesteremia     has DIABETES MELLITUS, TYPE II; HYPERCHOLESTEROLEMIA; ANEMIA, CHRONIC; CORONARY ARTERY DISEASE; SLEEP APNEA; Adenocarcinoma of rectum; Pancytopenia, acquired; and B12 deficiency on his problem list.     is allergic to daypro.  Mr. Guardia does not currently have medications on file.  Past Surgical History  Procedure Date  . Ileostomy 12/07/2010    Procedure: ILEOSTOMY;  Surgeon: [name];  Location: [facility];  Service: General;  Laterality: N/A;  Diverting Ileostomy Lysis of adhesions Exploratory Laparotomy  . Lung biopsy     rt lobe  . Cholecystectomy     Denies any headaches, dizziness, double vision, fevers, chills, night sweats, nausea, vomiting, diarrhea, constipation, chest pain, heart palpitations, shortness of breath, blood in stool, black tarry stool, urinary pain, urinary burning, urinary frequency, hematuria.   PHYSICAL EXAMINATION  ECOG PERFORMANCE STATUS: 0 -  Asymptomatic  Filed Vitals:   11/18/11 1030  BP: 131/85  Pulse: 77  Temp: 97.9 F (36.6 C)    GENERAL:alert, no distress, well nourished, well developed, comfortable, cooperative and smiling SKIN: skin color, texture, turgor are normal, no rashes or significant lesions HEAD: Normocephalic, No masses, lesions, tenderness or  abnormalities EYES: normal, Conjunctiva are pink and non-injected EARS: External ears normal OROPHARYNX:lips, buccal mucosa, and tongue normal, mucous membranes are moist and poor dentition  NECK: supple, no adenopathy, trachea midline LYMPH:  no palpable lymphadenopathy BREAST:not examined LUNGS: clear to auscultation and percussion HEART: regular rate & rhythm, no murmurs, no gallops, S1 normal and S2 normal ABDOMEN:abdomen soft, non-tender, obese, normal bowel sounds and colostomy bag with stool appreciated BACK: Back symmetric, no curvature., No CVA tenderness EXTREMITIES:less then 2 second capillary refill, no joint deformities, effusion, or inflammation, no edema, no skin discoloration, no clubbing, no cyanosis  NEURO: alert & oriented x 3 with fluent speech, no focal motor/sensory deficits, gait normal    LABORATORY DATA: CBC    Component Value Date/Time   WBC 5.6 11/16/2011 0929   RBC 4.60 11/16/2011 0929   HGB 13.6 11/16/2011 0929   HCT 42.0 11/16/2011 0929   PLT 61* 11/16/2011 0929   MCV 91.3 11/16/2011 0929   MCH 29.6 11/16/2011 0929   MCHC 32.4 11/16/2011 0929   RDW 18.6* 11/16/2011 0929   LYMPHSABS 0.7 11/16/2011 0929   MONOABS 0.7 11/16/2011 0929   EOSABS 0.1 11/16/2011 0929   BASOSABS 0.0 11/16/2011 0929      Chemistry      Component Value Date/Time   NA 136 11/16/2011 0929   K 4.2 11/16/2011 0929   CL 102 11/16/2011 0929   CO2 23 11/16/2011 0929   BUN 10 11/16/2011 0929   CREATININE 1.23 11/16/2011 0929      Component Value Date/Time   CALCIUM 10.0 11/16/2011 0929   ALKPHOS 105 11/16/2011 0929   AST 34 11/16/2011 0929   ALT 20 11/16/2011 0929   BILITOT 1.6* 11/16/2011 0929       Lab Results  Component Value Date   CEA 1.0 11/09/2011       ASSESSMENT:  1. 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 S/P 6 cycles of FOLFOX + Avastinchemotherapy. Oxaliplatin was reduced by 50% and 5-FU continuous infusion is reduced by 75%. Most recent PET scan had a mixed  response with liver and anastomic regions noted to be less hypermetabolic, but there is a slight increase in size of the right upper lobe lung lesion.  2. Thrombocytopenia, secondary to chemotherapy administration. Will follow closely. Will hold chemotherapy for a platelet count < 45,000   3. B12 Deficiency   PLAN:  1. I personally reviewed and went over laboratory results with the patient. 2. PET scan within the next 2 weeks for restaging purposes.  3. Pre-chemo lab work ordered: CBC diff, CMET/BMET, UA 4. CEA every 4 weeks 5. Return in 4 weeks for follow-up.  Will call patient with PET scan report.    All questions were answered. The patient knows to call the clinic with any problems, questions or concerns. We can certainly see the patient much sooner if necessary.  Gunnison Chahal

## 2011-11-18 NOTE — Progress Notes (Signed)
See MD encounter 

## 2011-11-25 ENCOUNTER — Other Ambulatory Visit (HOSPITAL_COMMUNITY): Payer: Self-pay | Admitting: Oncology

## 2011-11-25 ENCOUNTER — Telehealth (HOSPITAL_COMMUNITY): Payer: Self-pay | Admitting: Oncology

## 2011-11-25 DIAGNOSIS — C20 Malignant neoplasm of rectum: Secondary | ICD-10-CM

## 2011-11-28 ENCOUNTER — Other Ambulatory Visit (HOSPITAL_COMMUNITY): Payer: Managed Care, Other (non HMO)

## 2011-11-30 ENCOUNTER — Other Ambulatory Visit (HOSPITAL_COMMUNITY): Payer: Self-pay | Admitting: Oncology

## 2011-11-30 ENCOUNTER — Ambulatory Visit (HOSPITAL_COMMUNITY): Admit: 2011-11-30 | Payer: Managed Care, Other (non HMO)

## 2011-11-30 ENCOUNTER — Ambulatory Visit (HOSPITAL_COMMUNITY): Payer: Managed Care, Other (non HMO)

## 2011-11-30 ENCOUNTER — Ambulatory Visit (HOSPITAL_COMMUNITY)
Admit: 2011-11-30 | Discharge: 2011-11-30 | Disposition: A | Discharge status: Home / Self Care | Payer: Managed Care, Other (non HMO) | Source: Ambulatory Visit | Attending: Oncology | Admitting: Oncology

## 2011-11-30 DIAGNOSIS — C20 Malignant neoplasm of rectum: Secondary | ICD-10-CM

## 2011-11-30 DIAGNOSIS — R161 Splenomegaly, not elsewhere classified: Secondary | ICD-10-CM | POA: Insufficient documentation

## 2011-11-30 DIAGNOSIS — Z933 Colostomy status: Secondary | ICD-10-CM | POA: Insufficient documentation

## 2011-11-30 DIAGNOSIS — Z923 Personal history of irradiation: Secondary | ICD-10-CM | POA: Insufficient documentation

## 2011-11-30 DIAGNOSIS — N200 Calculus of kidney: Secondary | ICD-10-CM | POA: Insufficient documentation

## 2011-11-30 MED ORDER — IOHEXOL 300 MG/ML  SOLN
100.0000 mL | Freq: Once | INTRAMUSCULAR | Status: AC | PRN
  Administered 2011-11-30: 100 mL via INTRAVENOUS

## 2011-12-05 ENCOUNTER — Encounter (HOSPITAL_COMMUNITY): Payer: Self-pay | Admitting: Oncology

## 2011-12-05 ENCOUNTER — Encounter (HOSPITAL_COMMUNITY): Payer: Managed Care, Other (non HMO) | Attending: Oncology | Admitting: Oncology

## 2011-12-05 ENCOUNTER — Telehealth (HOSPITAL_COMMUNITY): Payer: Self-pay | Admitting: Oncology

## 2011-12-05 ENCOUNTER — Other Ambulatory Visit (HOSPITAL_COMMUNITY): Payer: Self-pay | Admitting: Oncology

## 2011-12-05 VITALS — BP 104/70 | HR 73 | Temp 98.3°F | Wt 227.4 lb

## 2011-12-05 DIAGNOSIS — E538 Deficiency of other specified B group vitamins: Secondary | ICD-10-CM | POA: Insufficient documentation

## 2011-12-05 DIAGNOSIS — C787 Secondary malignant neoplasm of liver and intrahepatic bile duct: Secondary | ICD-10-CM

## 2011-12-05 DIAGNOSIS — C20 Malignant neoplasm of rectum: Secondary | ICD-10-CM | POA: Insufficient documentation

## 2011-12-05 DIAGNOSIS — D696 Thrombocytopenia, unspecified: Secondary | ICD-10-CM

## 2011-12-05 DIAGNOSIS — C7800 Secondary malignant neoplasm of unspecified lung: Secondary | ICD-10-CM

## 2011-12-05 NOTE — Telephone Encounter (Signed)
[employer]-[phone] ? IF CPT CODES OXALIPLATIN J9263*ADRUCIL 9190*LEUCOVORIN 0640*NEULASTA 2505 PER DANIELLE NONE OF THE CODES REQUIRE AUTH. [id number]

## 2011-12-05 NOTE — Progress Notes (Signed)
[name], MD [address] [place] [address]  1. Adenocarcinoma of rectum     CURRENT THERAPY:S/P 13 cycles of FOLFOX + Avastin chemotherapy. Oxaliplatin was reduced by 50% and 5-FU continuous infusion is reduced by 75%.    INTERVAL HISTORY: [name] 61 y.o. 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   [name] is here to review his recent restaging CT scans.  I personally reviewed and went over radiographic studies with the patient. His lung lesion has increased in size slightly from 12 mm to 16 mm when recent exam is compared to PET scan on 07/11/2011.  His liver remains free of disease.  He also has a splenic infarct and splenomegaly which will not require intervention at this time.   I personally reviewed and went over radiographic studies with the patient.  His CEA has been within normal limits since Jan 2013 and is down from 58.9 on 02/02/2011 when therapy was restarted.   With this information, we will continue with therapy.  He understands that chemotherapy will not likely improve his lung lesion that seems to be progressing slowly.  He was educated on the importance of maintaining a disease free liver which this chemotherapy appears to be doing well.  [name] admits that he used to smoke, but has not smoked any tobacco in quit a few years, greater than 10 years.   [name] reports some bleeding after chemotherapy inferior, but near to his stoma site.  He explains that it was a "right amount of blood."  It has resolved spontaneously, but I informed him to follow-up with his surgeon if this occurs again.   [name] reports that he is doing well and feels well.  He denies any complaints.   Past Medical History  Diagnosis Date  . Cancer     Colon ca dx 2008 surg/rad/chemo  . Pancytopenia, acquired 12/04/2010  . B12 deficiency 12/07/2010  . Lung cancer   .  Hypertension   . Diabetes mellitus   . Hypercholesteremia     has DIABETES MELLITUS, TYPE II; HYPERCHOLESTEROLEMIA; ANEMIA, CHRONIC; CORONARY ARTERY DISEASE; SLEEP APNEA; Adenocarcinoma of rectum; Pancytopenia, acquired; and B12 deficiency on his problem list.     is allergic to daypro.  [name] does not currently have medications on file.  Past Surgical History  Procedure Date  . Ileostomy 12/07/2010    Procedure: ILEOSTOMY;  Surgeon: [name];  Location: [facility];  Service: General;  Laterality: N/A;  Diverting Ileostomy Lysis of adhesions Exploratory Laparotomy  . Lung biopsy     rt lobe  . Cholecystectomy     Denies any headaches, dizziness, double vision, fevers, chills, night sweats, nausea, vomiting, diarrhea, constipation, chest pain, heart palpitations, shortness of breath, blood in stool, black tarry stool, urinary pain, urinary burning, urinary frequency, hematuria.   PHYSICAL EXAMINATION  ECOG PERFORMANCE STATUS: 1 - Symptomatic but completely ambulatory  Filed Vitals:   12/05/11 0844  BP: 104/70  Pulse: 73  Temp: 98.3 F (36.8 C)    GENERAL:alert, well nourished, well developed, comfortable, cooperative and smiling SKIN: skin color, texture, turgor are normal, no rashes or significant lesions HEAD: Normocephalic, No masses, lesions, tenderness or abnormalities EYES: normal, Conjunctiva are pink and non-injected EARS: External ears normal OROPHARYNX:lips, buccal mucosa, and tongue normal, mucous membranes are moist and poor dentition  NECK: supple, trachea midline LYMPH:  not examined  BREAST:not examined LUNGS: not examined HEART: not examined ABDOMEN:colostomy is producing stool without any lesions or blood appreciated surrounding the site.  BACK: Back symmetric, no curvature. EXTREMITIES:no joint deformities, effusion, or inflammation, no skin discoloration  NEURO: alert & oriented x 3 with fluent speech, no focal motor/sensory deficits, gait  normal   RADIOGRAPHIC STUDIES:  11/30/2011  *RADIOLOGY REPORT*  Clinical Data: Metastatic colorectal cancer.  CT CHEST, ABDOMEN AND PELVIS WITH CONTRAST  Technique: Multidetector CT imaging of the chest, abdomen and  pelvis was performed following the standard protocol during bolus  administration of intravenous contrast.  Contrast: OMNIPAQUE IOHEXOL 300 MG/ML SOLN  Comparison: PET CT 07/11/2011.  CT CHEST  Findings: The left-sided Port-A-Cath is stable. No  supraclavicular or axillary lymphadenopathy or mass. Stable lesion  in the subcutaneous fat of the upper right back. This is likely a  benign sebaceous cyst. The the thyroid gland is unremarkable. The  bony thorax is intact. No destructive bone lesions or spinal canal  compromise. Stable degenerative changes involving the spine remote  compression deformity of T12 is unchanged.  The heart is normal in size. No pericardial effusion. No  mediastinal or hilar lymphadenopathy. Slightly prominent  pericardial and epicardial fat. There are extensive three-vessel  coronary artery calcifications. The aorta is normal in caliber.  No dissection. The esophagus is grossly normal.  Examination of the lung parenchyma demonstrates no acute pulmonary  findings. No infiltrates, edema or effusions.  Slight interval enlargement of the right upper lobe lung lesion.  It now measures 15 x 16 mm and previously measured 14 x 12 mm. It  has irregular margins and is concerning for a primary lung  neoplasm. No new pulmonary nodules.  IMPRESSION:  1. Enlarging right upper lobe lung lesion now measuring 16 x 15 mm  and concerning for primary lung neoplasm.  2. No findings for metastatic pulmonary nodules.  3. No mediastinal or hilar lymphadenopathy.  4. Extensive three-vessel coronary artery disease.  CT ABDOMEN AND PELVIS  Findings: The liver is unremarkable. I do not see any obvious  hepatic metastatic lesions. No biliary dilatation. The    gallbladder is surgically absent. No common bile duct dilatation.  The pancreas is unremarkable and stable.  The spleen is enlarged measuring 16 x 13 x 11 cm. There is a wedge-  shaped area of nonenhancement in the posterior and inferior aspect  of the spleen consistent with a splenic infarct. No. Splenic  fluid or hematoma. Does the patient have any left upper quadrant  pain? The portal and splenic veins are patent. The splenic artery  appears normal.  The adrenal glands are unremarkable. There are bilateral simple  appearing renal cysts and multiple left-sided renal calculi. There  are two large calculi in the renal pelvis but no acute obstruction.  The stomach, duodenum and small bowel are unremarkable. There is a  right lower quadrant ileostomy and sigmoid colostomy. Stable  surgical changes in the pelvis with rectosigmoid anastomoses.  Stable mild diffuse rectal wall thickening which may be due to  radiation. No pelvic lymphadenopathy or mass. The bladder,  prostate gland and seminal vesicles are unremarkable. No inguinal  mass or hernia. No inguinal adenopathy.  The aorta is normal in caliber. The major branch vessels are  patent. No mesenteric or retroperitoneal mass or adenopathy.  The bony structures are unremarkable. No new or worrisome bony  findings.  IMPRESSION:  1. Splenomegaly and splenic infarct.  2. No CT findings for hepatic metastatic disease, abdominal  or  pelvic lymphadenopathy.  3. Stable bilateral renal cysts and left renal calculi.  4. Stable right lower quadrant ileostomy and sigmoid colostomy.  5. Stable mild rectal wall thickening likely due to radiation  change.  Original Report Authenticated By: P. Loralie Champagne, M.D.    ASSESSMENT:  1. 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 S/P 6 cycles of FOLFOX + Avastinchemotherapy. Oxaliplatin was reduced by 50% and 5-FU continuous infusion is reduced by 75%. Most recent PET scan had a mixed  response with liver and anastomic regions noted to be less hypermetabolic, but there is a slight increase in size of the right upper lobe lung lesion.  2. Thrombocytopenia, secondary to chemotherapy administration. Will follow closely. Will hold chemotherapy for a platelet count < 45,000  3. B12 Deficiency    PLAN:  1. I personally reviewed and went over laboratory results with the patient. 2. I personally reviewed and went over radiographic studies with the patient. 3. Will continue with therapy.  He is scheduled for his next cycle on 12/07/2011. 4. Patient understands that the lung lesion is worsening and that continuation of chemotherapy will not likely improve his lung lesion, but the chemotherapy is maintaining a disease free liver per radiographic criteria.  5. Patient advised to follow-up with surgeon if bleeding from ostomy recurs.  6. Return as scheduled on 12/19/2011 for follow-up.    All questions were answered. The patient knows to call the clinic with any problems, questions or concerns. We can certainly see the patient much sooner if necessary.  The patient and plan discussed with Glenford Peers, MD and he is in agreement with the aforementioned.  [name]

## 2011-12-05 NOTE — Patient Instructions (Addendum)
[facility]  Discharge Instructions DELONTAE LAMM  [id number] March 21, 1951 Dr. Glenford Peers  RECOMMENDATIONS MADE BY THE CONSULTANT AND ANY TEST RESULTS WILL BE SENT TO YOUR REFERRING DOCTOR.   EXAM FINDINGS BY MD TODAY AND SIGNS AND SYMPTOMS TO REPORT TO CLINIC OR PRIMARY MD:  Exam per Jenita Seashore PA.    INSTRUCTIONS GIVEN AND DISCUSSED: Chemo as scheduled  SPECIAL INSTRUCTIONS/FOLLOW-UP:  appt 7/22 as scheduled  I acknowledge that I have been informed and understand all the instructions given to me and received a copy. I do not have any more questions at this time, but understand that I may call the Specialty Clinic at [facility] at [phone] during business hours should I have any further questions or need assistance in obtaining follow-up care.    __________________________________________  _____________  __________ Signature of Patient or Authorized Representative            Date                   Time    __________________________________________ Nurse's Signature

## 2011-12-07 ENCOUNTER — Encounter (HOSPITAL_BASED_OUTPATIENT_CLINIC_OR_DEPARTMENT_OTHER): Payer: Managed Care, Other (non HMO)

## 2011-12-07 VITALS — BP 124/82 | HR 72 | Temp 97.5°F | Wt 227.6 lb

## 2011-12-07 DIAGNOSIS — Z5111 Encounter for antineoplastic chemotherapy: Secondary | ICD-10-CM

## 2011-12-07 DIAGNOSIS — C20 Malignant neoplasm of rectum: Secondary | ICD-10-CM

## 2011-12-07 DIAGNOSIS — Z5112 Encounter for antineoplastic immunotherapy: Secondary | ICD-10-CM

## 2011-12-07 LAB — DIFFERENTIAL
Basophils Absolute: 0 10*3/uL (ref 0.0–0.1)
Basophils Relative: 0 % (ref 0–1)
Eosinophils Absolute: 0.1 10*3/uL (ref 0.0–0.7)
Monocytes Absolute: 0.7 10*3/uL (ref 0.1–1.0)
Neutro Abs: 4.1 10*3/uL (ref 1.7–7.7)
Neutrophils Relative %: 75 % (ref 43–77)

## 2011-12-07 LAB — COMPREHENSIVE METABOLIC PANEL
AST: 33 U/L (ref 0–37)
Albumin: 3.5 g/dL (ref 3.5–5.2)
Chloride: 106 mEq/L (ref 96–112)
Creatinine, Ser: 1.23 mg/dL (ref 0.50–1.35)
Total Bilirubin: 1.8 mg/dL — ABNORMAL HIGH (ref 0.3–1.2)
Total Protein: 6.4 g/dL (ref 6.0–8.3)

## 2011-12-07 LAB — CBC
HCT: 37.5 % — ABNORMAL LOW (ref 39.0–52.0)
MCHC: 32.5 g/dL (ref 30.0–36.0)
RDW: 20.1 % — ABNORMAL HIGH (ref 11.5–15.5)

## 2011-12-07 LAB — URINALYSIS, DIPSTICK ONLY
Glucose, UA: NEGATIVE mg/dL
Ketones, ur: NEGATIVE mg/dL
Nitrite: NEGATIVE
pH: 6 (ref 5.0–8.0)

## 2011-12-07 MED ORDER — SODIUM CHLORIDE 0.9 % IV SOLN
1800.0000 mg/m2 | INTRAVENOUS | Status: DC
  Administered 2011-12-07: 3800 mg via INTRAVENOUS
  Filled 2011-12-07 (×2): qty 76

## 2011-12-07 MED ORDER — SODIUM CHLORIDE 0.9 % IJ SOLN
INTRAMUSCULAR | Status: AC
  Filled 2011-12-07: qty 10

## 2011-12-07 MED ORDER — SODIUM CHLORIDE 0.9 % IV SOLN
5.0000 mg/kg | Freq: Once | INTRAVENOUS | Status: AC
  Administered 2011-12-07: 500 mg via INTRAVENOUS
  Filled 2011-12-07: qty 20

## 2011-12-07 MED ORDER — SODIUM CHLORIDE 0.9 % IJ SOLN
10.0000 mL | INTRAMUSCULAR | Status: DC | PRN
  Administered 2011-12-07: 10 mL
  Filled 2011-12-07: qty 10

## 2011-12-07 MED ORDER — LEUCOVORIN CALCIUM INJECTION 350 MG: 400.0000 mg/m2 | Freq: Once | INTRAVENOUS | Status: DC

## 2011-12-07 MED ORDER — FLUOROURACIL CHEMO INJECTION 2.5 GM/50ML
400.0000 mg/m2 | Freq: Once | INTRAVENOUS | Status: AC
  Administered 2011-12-07: 850 mg via INTRAVENOUS
  Filled 2011-12-07: qty 17

## 2011-12-07 MED ORDER — SODIUM CHLORIDE 0.9 % IV SOLN
Freq: Once | INTRAVENOUS | Status: AC
  Administered 2011-12-07: 8 mg via INTRAVENOUS
  Filled 2011-12-07: qty 4

## 2011-12-07 MED ORDER — SODIUM CHLORIDE 0.9 % IV SOLN
Freq: Once | INTRAVENOUS | Status: AC
  Administered 2011-12-07: 11:00:00 via INTRAVENOUS

## 2011-12-07 MED ORDER — DEXAMETHASONE SODIUM PHOSPHATE 10 MG/ML IJ SOLN: 10.0000 mg | Freq: Once | INTRAMUSCULAR | Status: DC

## 2011-12-07 MED ORDER — LEUCOVORIN CALCIUM INJECTION 100 MG
20.0000 mg/m2 | Freq: Once | INTRAMUSCULAR | Status: AC
  Administered 2011-12-07: 42 mg via INTRAVENOUS
  Filled 2011-12-07: qty 2.1

## 2011-12-07 MED ORDER — SODIUM CHLORIDE 0.9 % IV SOLN: 8.0000 mg | Freq: Once | INTRAVENOUS | Status: DC

## 2011-12-07 MED ORDER — OXALIPLATIN CHEMO INJECTION 100 MG/20ML
42.5000 mg/m2 | Freq: Once | INTRAVENOUS | Status: AC
  Administered 2011-12-07: 90 mg via INTRAVENOUS
  Filled 2011-12-07: qty 18

## 2011-12-07 MED ORDER — HEPARIN SOD (PORK) LOCK FLUSH 100 UNIT/ML IV SOLN
500.0000 [IU] | Freq: Once | INTRAVENOUS | Status: DC | PRN
  Filled 2011-12-07: qty 5

## 2011-12-07 NOTE — Progress Notes (Signed)
Tolerated chemo well. 

## 2011-12-09 ENCOUNTER — Encounter (HOSPITAL_BASED_OUTPATIENT_CLINIC_OR_DEPARTMENT_OTHER): Payer: Managed Care, Other (non HMO)

## 2011-12-09 VITALS — BP 119/74 | HR 78

## 2011-12-09 DIAGNOSIS — Z5189 Encounter for other specified aftercare: Secondary | ICD-10-CM

## 2011-12-09 DIAGNOSIS — E538 Deficiency of other specified B group vitamins: Secondary | ICD-10-CM

## 2011-12-09 DIAGNOSIS — C20 Malignant neoplasm of rectum: Secondary | ICD-10-CM

## 2011-12-09 MED ORDER — HEPARIN SOD (PORK) LOCK FLUSH 100 UNIT/ML IV SOLN
500.0000 [IU] | Freq: Once | INTRAVENOUS | Status: AC | PRN
  Administered 2011-12-09: 500 [IU]
  Filled 2011-12-09: qty 5

## 2011-12-09 MED ORDER — HEPARIN SOD (PORK) LOCK FLUSH 100 UNIT/ML IV SOLN
INTRAVENOUS | Status: AC
  Filled 2011-12-09: qty 5

## 2011-12-09 MED ORDER — CYANOCOBALAMIN 1000 MCG/ML IJ SOLN
INTRAMUSCULAR | Status: AC
  Filled 2011-12-09: qty 1

## 2011-12-09 MED ORDER — PEGFILGRASTIM INJECTION 6 MG/0.6ML
6.0000 mg | Freq: Once | SUBCUTANEOUS | Status: AC
  Administered 2011-12-09: 6 mg via SUBCUTANEOUS

## 2011-12-09 MED ORDER — PEGFILGRASTIM INJECTION 6 MG/0.6ML
SUBCUTANEOUS | Status: AC
  Filled 2011-12-09: qty 0.6

## 2011-12-09 MED ORDER — SODIUM CHLORIDE 0.9 % IJ SOLN
10.0000 mL | INTRAMUSCULAR | Status: DC | PRN
  Administered 2011-12-09: 10 mL
  Filled 2011-12-09: qty 10

## 2011-12-09 MED ORDER — SODIUM CHLORIDE 0.9 % IJ SOLN
INTRAMUSCULAR | Status: AC
  Filled 2011-12-09: qty 10

## 2011-12-09 MED ORDER — CYANOCOBALAMIN 1000 MCG/ML IJ SOLN
1000.0000 ug | Freq: Once | INTRAMUSCULAR | Status: AC
  Administered 2011-12-09: 1000 ug via INTRAMUSCULAR

## 2011-12-09 NOTE — Progress Notes (Signed)
Tolerated Vitamin B-12 injection well IM left arm well.  neulasta 6 mg given to lower left abd without difficultyl.Continuous IV pump D/C and port flushed with ns 10 ml and heparin 500 units.

## 2011-12-12 LAB — PROTEIN, URINE, 24 HOUR
Collection Interval-UPROT: 24 hours
Protein, 24H Urine: 250 mg/d — ABNORMAL HIGH (ref 50–100)
Protein, Urine: 50 mg/dL

## 2011-12-12 NOTE — Addendum Note (Signed)
Addended by: Dennie Maizes on: 12/12/2011 08:40 AM   Modules accepted: Orders

## 2011-12-19 ENCOUNTER — Encounter (HOSPITAL_COMMUNITY): Payer: Managed Care, Other (non HMO) | Admitting: Oncology

## 2011-12-21 ENCOUNTER — Other Ambulatory Visit (HOSPITAL_COMMUNITY): Payer: Self-pay | Admitting: Oncology

## 2011-12-21 ENCOUNTER — Encounter (HOSPITAL_BASED_OUTPATIENT_CLINIC_OR_DEPARTMENT_OTHER): Payer: Managed Care, Other (non HMO)

## 2011-12-21 ENCOUNTER — Telehealth (HOSPITAL_COMMUNITY): Payer: Self-pay

## 2011-12-21 VITALS — BP 129/77 | HR 76 | Temp 97.9°F | Wt 230.2 lb

## 2011-12-21 DIAGNOSIS — C20 Malignant neoplasm of rectum: Secondary | ICD-10-CM

## 2011-12-21 DIAGNOSIS — C7800 Secondary malignant neoplasm of unspecified lung: Secondary | ICD-10-CM

## 2011-12-21 DIAGNOSIS — C787 Secondary malignant neoplasm of liver and intrahepatic bile duct: Secondary | ICD-10-CM

## 2011-12-21 LAB — URINALYSIS, DIPSTICK ONLY
Glucose, UA: NEGATIVE mg/dL
Ketones, ur: NEGATIVE mg/dL
Protein, ur: 100 mg/dL — AB
pH: 5.5 (ref 5.0–8.0)

## 2011-12-21 LAB — BASIC METABOLIC PANEL
BUN: 8 mg/dL (ref 6–23)
Chloride: 101 mEq/L (ref 96–112)
GFR calc Af Amer: 86 mL/min — ABNORMAL LOW (ref >90)
GFR calc non Af Amer: 74 mL/min — ABNORMAL LOW (ref >90)
Glucose, Bld: 101 mg/dL — ABNORMAL HIGH (ref 70–99)
Potassium: 3.6 mEq/L (ref 3.5–5.1)
Sodium: 134 mEq/L — ABNORMAL LOW (ref 135–145)

## 2011-12-21 LAB — DIFFERENTIAL
Lymphs Abs: 0.6 10*3/uL — ABNORMAL LOW (ref 0.7–4.0)
Monocytes Relative: 7 % (ref 3–12)
Neutro Abs: 4.4 10*3/uL (ref 1.7–7.7)
Neutrophils Relative %: 79 % — ABNORMAL HIGH (ref 43–77)

## 2011-12-21 LAB — CBC
Hemoglobin: 12.3 g/dL — ABNORMAL LOW (ref 13.0–17.0)
Platelets: 28 10*3/uL — CL (ref 150–400)
RBC: 3.99 MIL/uL — ABNORMAL LOW (ref 4.22–5.81)
WBC: 5.6 10*3/uL (ref 4.0–10.5)

## 2011-12-21 MED ORDER — SODIUM CHLORIDE 0.9 % IJ SOLN
10.0000 mL | INTRAMUSCULAR | Status: DC | PRN
  Administered 2011-12-21: 10 mL via INTRAVENOUS
  Filled 2011-12-21: qty 10

## 2011-12-21 MED ORDER — HEPARIN SOD (PORK) LOCK FLUSH 100 UNIT/ML IV SOLN
INTRAVENOUS | Status: AC
  Filled 2011-12-21: qty 5

## 2011-12-21 MED ORDER — HEPARIN SOD (PORK) LOCK FLUSH 100 UNIT/ML IV SOLN
500.0000 [IU] | Freq: Once | INTRAVENOUS | Status: AC
  Administered 2011-12-21: 500 [IU] via INTRAVENOUS
  Filled 2011-12-21: qty 5

## 2011-12-21 MED ORDER — SODIUM CHLORIDE 0.9 % IJ SOLN
INTRAMUSCULAR | Status: AC
  Filled 2011-12-21: qty 10

## 2011-12-21 NOTE — Progress Notes (Signed)
Platelets 28,000. Chemo defered  X 1 week.

## 2011-12-21 NOTE — Progress Notes (Signed)
Has pain in mid to lower spine, cervical neck, and shoulders a few days after chemo then it goes away. Numbness continues in fingertips after chemo when reaching in refrigerator. Toes tingle all the time. No energy.

## 2011-12-21 NOTE — Telephone Encounter (Signed)
CRITICAL VALUE ALERT Critical value received:  Platelet count 28,000  Date of notification:  12/21/11   Time of notification: 1020  Critical value read back:  yes Nurse who received alert:  Tobie Lords, RN MD notified (1st page):  Dellis Anes, [place]

## 2011-12-23 ENCOUNTER — Encounter (HOSPITAL_COMMUNITY): Payer: Managed Care, Other (non HMO)

## 2011-12-28 ENCOUNTER — Other Ambulatory Visit (HOSPITAL_COMMUNITY): Payer: Self-pay | Admitting: Oncology

## 2011-12-28 ENCOUNTER — Encounter (HOSPITAL_BASED_OUTPATIENT_CLINIC_OR_DEPARTMENT_OTHER): Payer: Managed Care, Other (non HMO)

## 2011-12-28 VITALS — BP 127/82 | HR 75 | Temp 97.5°F | Wt 231.4 lb

## 2011-12-28 DIAGNOSIS — Z5112 Encounter for antineoplastic immunotherapy: Secondary | ICD-10-CM

## 2011-12-28 DIAGNOSIS — Z5111 Encounter for antineoplastic chemotherapy: Secondary | ICD-10-CM

## 2011-12-28 DIAGNOSIS — C787 Secondary malignant neoplasm of liver and intrahepatic bile duct: Secondary | ICD-10-CM

## 2011-12-28 DIAGNOSIS — C20 Malignant neoplasm of rectum: Secondary | ICD-10-CM

## 2011-12-28 LAB — DIFFERENTIAL
Eosinophils Relative: 5 % (ref 0–5)
Lymphocytes Relative: 13 % (ref 12–46)
Monocytes Absolute: 0.6 10*3/uL (ref 0.1–1.0)
Monocytes Relative: 12 % (ref 3–12)
Neutro Abs: 3.7 10*3/uL (ref 1.7–7.7)

## 2011-12-28 LAB — CBC
HCT: 39.2 % (ref 39.0–52.0)
Hemoglobin: 12.6 g/dL — ABNORMAL LOW (ref 13.0–17.0)
MCV: 95.6 fL (ref 78.0–100.0)
RDW: 19.1 % — ABNORMAL HIGH (ref 11.5–15.5)
WBC: 5.2 10*3/uL (ref 4.0–10.5)

## 2011-12-28 MED ORDER — SODIUM CHLORIDE 0.9 % IV SOLN
1800.0000 mg/m2 | INTRAVENOUS | Status: DC
  Administered 2011-12-28: 3800 mg via INTRAVENOUS
  Filled 2011-12-28 (×2): qty 76

## 2011-12-28 MED ORDER — SODIUM CHLORIDE 0.9 % IJ SOLN
INTRAMUSCULAR | Status: AC
  Filled 2011-12-28: qty 10

## 2011-12-28 MED ORDER — DEXAMETHASONE SODIUM PHOSPHATE 10 MG/ML IJ SOLN: 10.0000 mg | Freq: Once | INTRAMUSCULAR | Status: DC

## 2011-12-28 MED ORDER — SODIUM CHLORIDE 0.9 % IJ SOLN
10.0000 mL | INTRAMUSCULAR | Status: DC | PRN
  Administered 2011-12-28: 10 mL
  Filled 2011-12-28: qty 10

## 2011-12-28 MED ORDER — SODIUM CHLORIDE 0.9 % IV SOLN
Freq: Once | INTRAVENOUS | Status: AC
  Administered 2011-12-28: 11:00:00 via INTRAVENOUS

## 2011-12-28 MED ORDER — LEUCOVORIN CALCIUM INJECTION 100 MG
20.0000 mg/m2 | Freq: Once | INTRAMUSCULAR | Status: AC
  Administered 2011-12-28: 42 mg via INTRAVENOUS
  Filled 2011-12-28: qty 2.1

## 2011-12-28 MED ORDER — DEXTROSE 5 % IV SOLN
42.5000 mg/m2 | Freq: Once | INTRAVENOUS | Status: AC
  Administered 2011-12-28: 90 mg via INTRAVENOUS
  Filled 2011-12-28: qty 18

## 2011-12-28 MED ORDER — DEXTROSE 5 % IV SOLN
50.0000 mL | INTRAVENOUS | Status: AC
  Filled 2011-12-28 (×2): qty 50

## 2011-12-28 MED ORDER — FLUOROURACIL CHEMO INJECTION 2.5 GM/50ML
400.0000 mg/m2 | Freq: Once | INTRAVENOUS | Status: AC
  Administered 2011-12-28: 850 mg via INTRAVENOUS
  Filled 2011-12-28: qty 17

## 2011-12-28 MED ORDER — BEVACIZUMAB CHEMO INJECTION 400 MG/16ML
5.0000 mg/kg | Freq: Once | INTRAVENOUS | Status: AC
  Administered 2011-12-28: 500 mg via INTRAVENOUS
  Filled 2011-12-28: qty 20

## 2011-12-28 MED ORDER — HEPARIN SOD (PORK) LOCK FLUSH 100 UNIT/ML IV SOLN
500.0000 [IU] | Freq: Once | INTRAVENOUS | Status: DC | PRN
  Filled 2011-12-28: qty 5

## 2011-12-28 MED ORDER — SODIUM CHLORIDE 0.9 % IV SOLN: 8.0000 mg | Freq: Once | INTRAVENOUS | Status: DC

## 2011-12-28 MED ORDER — LEUCOVORIN CALCIUM INJECTION 350 MG: 400.0000 mg/m2 | Freq: Once | INTRAMUSCULAR | Status: DC

## 2011-12-28 MED ORDER — SODIUM CHLORIDE 0.9 % IV SOLN
Freq: Once | INTRAVENOUS | Status: AC
  Administered 2011-12-28: 8 mg via INTRAVENOUS
  Filled 2011-12-28: qty 4

## 2011-12-28 NOTE — Progress Notes (Signed)
Tolerated chemo very well.  Home accompanied by spouse.

## 2011-12-30 ENCOUNTER — Encounter (HOSPITAL_COMMUNITY): Payer: Managed Care, Other (non HMO) | Attending: Oncology

## 2011-12-30 DIAGNOSIS — Z452 Encounter for adjustment and management of vascular access device: Secondary | ICD-10-CM

## 2011-12-30 DIAGNOSIS — E538 Deficiency of other specified B group vitamins: Secondary | ICD-10-CM | POA: Insufficient documentation

## 2011-12-30 DIAGNOSIS — C20 Malignant neoplasm of rectum: Secondary | ICD-10-CM | POA: Insufficient documentation

## 2011-12-30 MED ORDER — HEPARIN SOD (PORK) LOCK FLUSH 100 UNIT/ML IV SOLN
500.0000 [IU] | Freq: Once | INTRAVENOUS | Status: AC
  Administered 2011-12-30: 500 [IU] via INTRAVENOUS
  Filled 2011-12-30: qty 5

## 2011-12-30 MED ORDER — SODIUM CHLORIDE 0.9 % IJ SOLN
20.0000 mL | INTRAMUSCULAR | Status: DC | PRN
  Administered 2011-12-30: 20 mL via INTRAVENOUS
  Filled 2011-12-30: qty 20

## 2011-12-30 MED ORDER — HEPARIN SOD (PORK) LOCK FLUSH 100 UNIT/ML IV SOLN
INTRAVENOUS | Status: AC
  Filled 2011-12-30: qty 5

## 2011-12-30 MED ORDER — SODIUM CHLORIDE 0.9 % IJ SOLN
INTRAMUSCULAR | Status: AC
  Filled 2011-12-30: qty 20

## 2012-01-01 ENCOUNTER — Encounter (HOSPITAL_BASED_OUTPATIENT_CLINIC_OR_DEPARTMENT_OTHER): Payer: Managed Care, Other (non HMO)

## 2012-01-01 VITALS — BP 118/81 | HR 66 | Temp 96.8°F | Resp 20

## 2012-01-01 DIAGNOSIS — Z5189 Encounter for other specified aftercare: Secondary | ICD-10-CM

## 2012-01-01 DIAGNOSIS — C20 Malignant neoplasm of rectum: Secondary | ICD-10-CM

## 2012-01-01 MED ORDER — PEGFILGRASTIM INJECTION 6 MG/0.6ML
SUBCUTANEOUS | Status: AC
  Filled 2012-01-01: qty 0.6

## 2012-01-01 MED ORDER — PEGFILGRASTIM INJECTION 6 MG/0.6ML
6.0000 mg | Freq: Once | SUBCUTANEOUS | Status: AC
  Administered 2012-01-01: 6 mg via SUBCUTANEOUS

## 2012-01-01 NOTE — Progress Notes (Signed)
[name] presents today for injection per MD orders. Neulasta 6mg administered SQ in left Abdomen. Administration without incident. Patient tolerated well.  

## 2012-01-09 ENCOUNTER — Ambulatory Visit (HOSPITAL_COMMUNITY): Payer: Managed Care, Other (non HMO)

## 2012-01-11 ENCOUNTER — Encounter (HOSPITAL_BASED_OUTPATIENT_CLINIC_OR_DEPARTMENT_OTHER): Payer: Managed Care, Other (non HMO)

## 2012-01-11 ENCOUNTER — Ambulatory Visit (HOSPITAL_COMMUNITY): Payer: Managed Care, Other (non HMO)

## 2012-01-11 ENCOUNTER — Other Ambulatory Visit (HOSPITAL_COMMUNITY): Payer: Self-pay | Admitting: Oncology

## 2012-01-11 VITALS — BP 132/90 | HR 82 | Temp 97.3°F | Resp 20 | Wt 232.2 lb

## 2012-01-11 DIAGNOSIS — N390 Urinary tract infection, site not specified: Secondary | ICD-10-CM | POA: Insufficient documentation

## 2012-01-11 DIAGNOSIS — C20 Malignant neoplasm of rectum: Secondary | ICD-10-CM | POA: Insufficient documentation

## 2012-01-11 DIAGNOSIS — E538 Deficiency of other specified B group vitamins: Secondary | ICD-10-CM

## 2012-01-11 LAB — COMPREHENSIVE METABOLIC PANEL
ALT: 37 U/L (ref 0–53)
Albumin: 3.2 g/dL — ABNORMAL LOW (ref 3.5–5.2)
Calcium: 9.2 mg/dL (ref 8.4–10.5)
GFR calc Af Amer: 76 mL/min — ABNORMAL LOW (ref >90)
Glucose, Bld: 132 mg/dL — ABNORMAL HIGH (ref 70–99)
Potassium: 3.8 mEq/L (ref 3.5–5.1)
Sodium: 139 mEq/L (ref 135–145)
Total Protein: 6.1 g/dL (ref 6.0–8.3)

## 2012-01-11 LAB — URINALYSIS, DIPSTICK ONLY
Bilirubin Urine: NEGATIVE
Ketones, ur: NEGATIVE mg/dL
Nitrite: POSITIVE — AB
Protein, ur: 100 mg/dL — AB
Urobilinogen, UA: 0.2 mg/dL (ref 0.0–1.0)

## 2012-01-11 LAB — CBC WITH DIFFERENTIAL/PLATELET
Basophils Absolute: 0 10*3/uL (ref 0.0–0.1)
Basophils Relative: 0 % (ref 0–1)
Eosinophils Absolute: 0.1 10*3/uL (ref 0.0–0.7)
Eosinophils Relative: 2 % (ref 0–5)
HCT: 37.3 % — ABNORMAL LOW (ref 39.0–52.0)
MCH: 31.1 pg (ref 26.0–34.0)
MCHC: 33 g/dL (ref 30.0–36.0)
MCV: 94.2 fL (ref 78.0–100.0)
Monocytes Absolute: 0.4 10*3/uL (ref 0.1–1.0)
Monocytes Relative: 6 % (ref 3–12)
Neutro Abs: 4.9 10*3/uL (ref 1.7–7.7)
RDW: 18 % — ABNORMAL HIGH (ref 11.5–15.5)

## 2012-01-11 MED ORDER — CYANOCOBALAMIN 1000 MCG/ML IJ SOLN
1000.0000 ug | Freq: Once | INTRAMUSCULAR | Status: AC
  Administered 2012-01-11: 1000 ug via INTRAMUSCULAR

## 2012-01-11 MED ORDER — CYANOCOBALAMIN 1000 MCG/ML IJ SOLN: 1000.0000 ug | Freq: Once | INTRAMUSCULAR | Status: DC

## 2012-01-11 MED ORDER — CYANOCOBALAMIN 1000 MCG/ML IJ SOLN
INTRAMUSCULAR | Status: AC
  Filled 2012-01-11: qty 1

## 2012-01-11 MED ORDER — SULFAMETHOXAZOLE-TRIMETHOPRIM 800-160 MG PO TABS: 1.0000 | ORAL_TABLET | Freq: Two times a day (BID) | ORAL | Status: AC

## 2012-01-11 MED ORDER — SODIUM CHLORIDE 0.9 % IJ SOLN
INTRAMUSCULAR | Status: AC
  Filled 2012-01-11: qty 10

## 2012-01-11 NOTE — Progress Notes (Signed)
Chemo held today due to low platelet count. Vitamin B-12 given IM to right deltoid. Tolerated well.

## 2012-01-11 NOTE — Progress Notes (Signed)
CRITICAL VALUE ALERT Critical value received:platelets 22,000 Date of notification:  01/11/12  Time of notification: 1042am Critical value read back:  yes Nurse who received alert:  T.Legion Discher,RN MD notified (1st page):  1042am

## 2012-01-13 ENCOUNTER — Encounter (HOSPITAL_COMMUNITY): Payer: Managed Care, Other (non HMO)

## 2012-01-18 ENCOUNTER — Encounter (HOSPITAL_BASED_OUTPATIENT_CLINIC_OR_DEPARTMENT_OTHER): Payer: Managed Care, Other (non HMO)

## 2012-01-18 ENCOUNTER — Ambulatory Visit (HOSPITAL_COMMUNITY): Payer: Managed Care, Other (non HMO)

## 2012-01-18 DIAGNOSIS — C20 Malignant neoplasm of rectum: Secondary | ICD-10-CM

## 2012-01-18 LAB — CBC WITH DIFFERENTIAL/PLATELET
Basophils Absolute: 0 10*3/uL (ref 0.0–0.1)
Basophils Relative: 0 % (ref 0–1)
Eosinophils Absolute: 0.1 10*3/uL (ref 0.0–0.7)
MCH: 30.9 pg (ref 26.0–34.0)
MCHC: 32.7 g/dL (ref 30.0–36.0)
Monocytes Absolute: 0.5 10*3/uL (ref 0.1–1.0)
Monocytes Relative: 10 % (ref 3–12)
Neutro Abs: 4.3 10*3/uL (ref 1.7–7.7)
Neutrophils Relative %: 80 % — ABNORMAL HIGH (ref 43–77)
RDW: 18.4 % — ABNORMAL HIGH (ref 11.5–15.5)

## 2012-01-18 MED ORDER — SODIUM CHLORIDE 0.9 % IJ SOLN
INTRAMUSCULAR | Status: AC
  Filled 2012-01-18: qty 10

## 2012-01-18 MED ORDER — HEPARIN SOD (PORK) LOCK FLUSH 100 UNIT/ML IV SOLN
INTRAVENOUS | Status: AC
  Filled 2012-01-18: qty 5

## 2012-01-18 NOTE — Progress Notes (Signed)
[name] presented for Portacath access and flush. Proper placement of portacath confirmed by CXR. Portacath located left chest wall accessed with  H 20 needle. Good blood return present. Portacath flushed with 20ml NS and 500U/57ml Heparin and needle removed intact. Procedure without incident. Patient tolerated procedure well.  Chemotherapy deferred x 1 week d/t low platelet count. Avastin held per MD to coincide with chemo date.

## 2012-01-20 ENCOUNTER — Encounter (HOSPITAL_COMMUNITY): Payer: Managed Care, Other (non HMO)

## 2012-01-25 ENCOUNTER — Other Ambulatory Visit (HOSPITAL_COMMUNITY): Payer: Self-pay | Admitting: Oncology

## 2012-01-25 ENCOUNTER — Encounter (HOSPITAL_BASED_OUTPATIENT_CLINIC_OR_DEPARTMENT_OTHER): Payer: Managed Care, Other (non HMO)

## 2012-01-25 VITALS — BP 127/79 | HR 79 | Temp 97.4°F | Resp 18 | Wt 237.0 lb

## 2012-01-25 DIAGNOSIS — C20 Malignant neoplasm of rectum: Secondary | ICD-10-CM

## 2012-01-25 DIAGNOSIS — Z5111 Encounter for antineoplastic chemotherapy: Secondary | ICD-10-CM

## 2012-01-25 LAB — CBC WITH DIFFERENTIAL/PLATELET
Basophils Relative: 0 % (ref 0–1)
Eosinophils Absolute: 0.2 10*3/uL (ref 0.0–0.7)
Eosinophils Relative: 3 % (ref 0–5)
HCT: 36.9 % — ABNORMAL LOW (ref 39.0–52.0)
Hemoglobin: 12 g/dL — ABNORMAL LOW (ref 13.0–17.0)
Lymphs Abs: 0.6 10*3/uL — ABNORMAL LOW (ref 0.7–4.0)
MCH: 30.8 pg (ref 26.0–34.0)
MCHC: 32.5 g/dL (ref 30.0–36.0)
MCV: 94.6 fL (ref 78.0–100.0)
Monocytes Absolute: 0.7 10*3/uL (ref 0.1–1.0)
Monocytes Relative: 15 % — ABNORMAL HIGH (ref 3–12)
RBC: 3.9 MIL/uL — ABNORMAL LOW (ref 4.22–5.81)

## 2012-01-25 LAB — COMPREHENSIVE METABOLIC PANEL
Albumin: 3.2 g/dL — ABNORMAL LOW (ref 3.5–5.2)
Alkaline Phosphatase: 104 U/L (ref 39–117)
BUN: 7 mg/dL (ref 6–23)
Creatinine, Ser: 1.19 mg/dL (ref 0.50–1.35)
GFR calc Af Amer: 76 mL/min — ABNORMAL LOW (ref >90)
Glucose, Bld: 151 mg/dL — ABNORMAL HIGH (ref 70–99)
Total Protein: 6.3 g/dL (ref 6.0–8.3)

## 2012-01-25 MED ORDER — SODIUM CHLORIDE 0.9 % IV SOLN
1800.0000 mg/m2 | INTRAVENOUS | Status: DC
  Administered 2012-01-25: 3800 mg via INTRAVENOUS
  Filled 2012-01-25 (×2): qty 76

## 2012-01-25 MED ORDER — DEXTROSE 5 % IV SOLN
Freq: Once | INTRAVENOUS | Status: AC
  Administered 2012-01-25: 11:00:00 via INTRAVENOUS
  Filled 2012-01-25: qty 1000

## 2012-01-25 MED ORDER — LEUCOVORIN CALCIUM INJECTION 100 MG
20.0000 mg/m2 | Freq: Once | INTRAMUSCULAR | Status: AC
  Administered 2012-01-25: 42 mg via INTRAVENOUS
  Filled 2012-01-25: qty 2.1

## 2012-01-25 MED ORDER — FLUOROURACIL CHEMO INJECTION 2.5 GM/50ML
400.0000 mg/m2 | Freq: Once | INTRAVENOUS | Status: AC
  Administered 2012-01-25: 850 mg via INTRAVENOUS
  Filled 2012-01-25: qty 17

## 2012-01-25 MED ORDER — SODIUM CHLORIDE 0.9 % IJ SOLN
10.0000 mL | INTRAMUSCULAR | Status: DC | PRN
  Administered 2012-01-25: 10 mL
  Filled 2012-01-25: qty 10

## 2012-01-25 MED ORDER — OXALIPLATIN CHEMO INJECTION 100 MG/20ML
42.5000 mg/m2 | Freq: Once | INTRAVENOUS | Status: AC
  Administered 2012-01-25: 90 mg via INTRAVENOUS
  Filled 2012-01-25: qty 18

## 2012-01-25 MED ORDER — SODIUM CHLORIDE 0.9 % IV SOLN: 8.0000 mg | Freq: Once | INTRAVENOUS | Status: DC

## 2012-01-25 MED ORDER — SODIUM CHLORIDE 0.9 % IV SOLN
Freq: Once | INTRAVENOUS | Status: AC
  Administered 2012-01-25: 09:00:00 via INTRAVENOUS

## 2012-01-25 MED ORDER — DEXAMETHASONE SODIUM PHOSPHATE 10 MG/ML IJ SOLN: 10.0000 mg | Freq: Once | INTRAMUSCULAR | Status: DC

## 2012-01-25 MED ORDER — SODIUM CHLORIDE 0.9 % IV SOLN
Freq: Once | INTRAVENOUS | Status: AC
  Administered 2012-01-25: 8 mg via INTRAVENOUS
  Filled 2012-01-25: qty 4

## 2012-01-25 MED ORDER — SODIUM CHLORIDE 0.9 % IV SOLN
5.0000 mg/kg | Freq: Once | INTRAVENOUS | Status: AC
  Administered 2012-01-25: 500 mg via INTRAVENOUS
  Filled 2012-01-25: qty 20

## 2012-01-25 MED ORDER — LEUCOVORIN CALCIUM INJECTION 350 MG: 400.0000 mg/m2 | Freq: Once | INTRAVENOUS | Status: DC

## 2012-01-25 MED ORDER — SODIUM CHLORIDE 0.9 % IJ SOLN
INTRAMUSCULAR | Status: AC
  Filled 2012-01-25: qty 10

## 2012-01-25 NOTE — Progress Notes (Signed)
Tolerated infusion well. 

## 2012-01-27 ENCOUNTER — Ambulatory Visit (HOSPITAL_COMMUNITY): Payer: Managed Care, Other (non HMO)

## 2012-01-27 ENCOUNTER — Encounter (HOSPITAL_BASED_OUTPATIENT_CLINIC_OR_DEPARTMENT_OTHER): Payer: Managed Care, Other (non HMO)

## 2012-01-27 VITALS — BP 133/81 | HR 84

## 2012-01-27 DIAGNOSIS — C20 Malignant neoplasm of rectum: Secondary | ICD-10-CM

## 2012-01-27 DIAGNOSIS — Z5189 Encounter for other specified aftercare: Secondary | ICD-10-CM

## 2012-01-27 MED ORDER — PEGFILGRASTIM INJECTION 6 MG/0.6ML
SUBCUTANEOUS | Status: AC
  Filled 2012-01-27: qty 0.6

## 2012-01-27 MED ORDER — HEPARIN SOD (PORK) LOCK FLUSH 100 UNIT/ML IV SOLN
INTRAVENOUS | Status: AC
  Filled 2012-01-27: qty 5

## 2012-01-27 MED ORDER — SODIUM CHLORIDE 0.9 % IJ SOLN
10.0000 mL | INTRAMUSCULAR | Status: DC | PRN
  Administered 2012-01-27: 10 mL
  Filled 2012-01-27: qty 10

## 2012-01-27 MED ORDER — PEGFILGRASTIM INJECTION 6 MG/0.6ML
6.0000 mg | Freq: Once | SUBCUTANEOUS | Status: AC
  Administered 2012-01-27: 6 mg via SUBCUTANEOUS

## 2012-01-27 MED ORDER — HEPARIN SOD (PORK) LOCK FLUSH 100 UNIT/ML IV SOLN
500.0000 [IU] | Freq: Once | INTRAVENOUS | Status: AC | PRN
  Administered 2012-01-27: 500 [IU]
  Filled 2012-01-27: qty 5

## 2012-01-27 NOTE — Progress Notes (Signed)
Tolerated port flush well.  Neulasta given sub-q without difficulty/

## 2012-02-08 ENCOUNTER — Encounter (HOSPITAL_COMMUNITY): Payer: Managed Care, Other (non HMO) | Attending: Oncology

## 2012-02-08 ENCOUNTER — Ambulatory Visit (HOSPITAL_COMMUNITY): Payer: Managed Care, Other (non HMO)

## 2012-02-08 ENCOUNTER — Telehealth (HOSPITAL_COMMUNITY): Payer: Self-pay | Admitting: *Deleted

## 2012-02-08 ENCOUNTER — Other Ambulatory Visit (HOSPITAL_COMMUNITY): Payer: Self-pay | Admitting: Oncology

## 2012-02-08 VITALS — BP 129/78 | HR 98 | Temp 98.0°F | Resp 18

## 2012-02-08 DIAGNOSIS — C20 Malignant neoplasm of rectum: Secondary | ICD-10-CM

## 2012-02-08 DIAGNOSIS — E538 Deficiency of other specified B group vitamins: Secondary | ICD-10-CM | POA: Insufficient documentation

## 2012-02-08 DIAGNOSIS — Z452 Encounter for adjustment and management of vascular access device: Secondary | ICD-10-CM

## 2012-02-08 LAB — CBC WITH DIFFERENTIAL/PLATELET
Basophils Absolute: 0 10*3/uL (ref 0.0–0.1)
Eosinophils Relative: 2 % (ref 0–5)
Lymphocytes Relative: 9 % — ABNORMAL LOW (ref 12–46)
Lymphs Abs: 0.6 10*3/uL — ABNORMAL LOW (ref 0.7–4.0)
MCV: 92.9 fL (ref 78.0–100.0)
Neutro Abs: 5.2 10*3/uL (ref 1.7–7.7)
Neutrophils Relative %: 82 % — ABNORMAL HIGH (ref 43–77)
Platelets: 22 10*3/uL — CL (ref 150–400)
RBC: 4.09 MIL/uL — ABNORMAL LOW (ref 4.22–5.81)
RDW: 17.6 % — ABNORMAL HIGH (ref 11.5–15.5)
WBC: 6.4 10*3/uL (ref 4.0–10.5)

## 2012-02-08 LAB — BASIC METABOLIC PANEL
CO2: 25 mEq/L (ref 19–32)
Calcium: 9.4 mg/dL (ref 8.4–10.5)
GFR calc Af Amer: 76 mL/min — ABNORMAL LOW (ref >90)
Sodium: 138 mEq/L (ref 135–145)

## 2012-02-08 LAB — CEA: CEA: 2.3 ng/mL (ref 0.0–5.0)

## 2012-02-08 MED ORDER — HEPARIN SOD (PORK) LOCK FLUSH 100 UNIT/ML IV SOLN
500.0000 [IU] | Freq: Once | INTRAVENOUS | Status: AC
  Administered 2012-02-08: 500 [IU] via INTRAVENOUS
  Filled 2012-02-08: qty 5

## 2012-02-08 MED ORDER — SODIUM CHLORIDE 0.9 % IJ SOLN
INTRAMUSCULAR | Status: AC
  Filled 2012-02-08: qty 10

## 2012-02-08 MED ORDER — HEPARIN SOD (PORK) LOCK FLUSH 100 UNIT/ML IV SOLN
INTRAVENOUS | Status: AC
  Filled 2012-02-08: qty 5

## 2012-02-08 MED ORDER — SODIUM CHLORIDE 0.9 % IJ SOLN
10.0000 mL | INTRAMUSCULAR | Status: DC | PRN
  Administered 2012-02-08: 10 mL via INTRAVENOUS
  Filled 2012-02-08: qty 10

## 2012-02-08 NOTE — Progress Notes (Signed)
Tolerated port flush with labs well. Chemo will be postponed for one week due to low platelet count.

## 2012-02-08 NOTE — Progress Notes (Signed)
CRITICAL VALUE ALERT  Critical value received:  Platelets 22,000  Date of notification:  02/07/12  Time of notification:  1038  Critical value read back:yes  Nurse who received alert:  Rosana Berger RN  Responding MD:  Jenita Seashore PA-C  Time MD responded:  [phone]

## 2012-02-10 ENCOUNTER — Encounter (HOSPITAL_COMMUNITY): Payer: Managed Care, Other (non HMO)

## 2012-02-15 ENCOUNTER — Encounter (HOSPITAL_BASED_OUTPATIENT_CLINIC_OR_DEPARTMENT_OTHER): Payer: Managed Care, Other (non HMO)

## 2012-02-15 VITALS — BP 143/87 | HR 91 | Temp 97.8°F | Resp 20 | Wt 238.6 lb

## 2012-02-15 DIAGNOSIS — E538 Deficiency of other specified B group vitamins: Secondary | ICD-10-CM

## 2012-02-15 DIAGNOSIS — C20 Malignant neoplasm of rectum: Secondary | ICD-10-CM

## 2012-02-15 DIAGNOSIS — Z5111 Encounter for antineoplastic chemotherapy: Secondary | ICD-10-CM

## 2012-02-15 LAB — CBC WITH DIFFERENTIAL/PLATELET
Basophils Relative: 0 % (ref 0–1)
Eosinophils Absolute: 0.2 10*3/uL (ref 0.0–0.7)
Eosinophils Relative: 3 % (ref 0–5)
Hemoglobin: 12 g/dL — ABNORMAL LOW (ref 13.0–17.0)
Lymphs Abs: 0.5 10*3/uL — ABNORMAL LOW (ref 0.7–4.0)
MCH: 30.2 pg (ref 26.0–34.0)
MCHC: 32.3 g/dL (ref 30.0–36.0)
MCV: 93.7 fL (ref 78.0–100.0)
Monocytes Relative: 13 % — ABNORMAL HIGH (ref 3–12)
Neutrophils Relative %: 73 % (ref 43–77)
RBC: 3.97 MIL/uL — ABNORMAL LOW (ref 4.22–5.81)

## 2012-02-15 MED ORDER — SODIUM CHLORIDE 0.9 % IV SOLN
5.0000 mg/kg | Freq: Once | INTRAVENOUS | Status: AC
  Administered 2012-02-15: 500 mg via INTRAVENOUS
  Filled 2012-02-15: qty 20

## 2012-02-15 MED ORDER — SODIUM CHLORIDE 0.9 % IJ SOLN
INTRAMUSCULAR | Status: AC
  Filled 2012-02-15: qty 10

## 2012-02-15 MED ORDER — DEXAMETHASONE SODIUM PHOSPHATE 10 MG/ML IJ SOLN: 10.0000 mg | Freq: Once | INTRAMUSCULAR | Status: DC

## 2012-02-15 MED ORDER — LEUCOVORIN CALCIUM INJECTION 100 MG
20.0000 mg/m2 | Freq: Once | INTRAMUSCULAR | Status: AC
  Administered 2012-02-15: 42 mg via INTRAVENOUS
  Filled 2012-02-15: qty 2.1

## 2012-02-15 MED ORDER — SODIUM CHLORIDE 0.9 % IV SOLN
Freq: Once | INTRAVENOUS | Status: AC
  Administered 2012-02-15: 11:00:00 via INTRAVENOUS

## 2012-02-15 MED ORDER — SODIUM CHLORIDE 0.9 % IV SOLN
Freq: Once | INTRAVENOUS | Status: AC
  Administered 2012-02-15: 8 mg via INTRAVENOUS
  Filled 2012-02-15: qty 4

## 2012-02-15 MED ORDER — FLUOROURACIL CHEMO INJECTION 2.5 GM/50ML
400.0000 mg/m2 | Freq: Once | INTRAVENOUS | Status: AC
  Administered 2012-02-15: 850 mg via INTRAVENOUS
  Filled 2012-02-15: qty 17

## 2012-02-15 MED ORDER — SODIUM CHLORIDE 0.9 % IV SOLN: 8.0000 mg | Freq: Once | INTRAVENOUS | Status: DC

## 2012-02-15 MED ORDER — CYANOCOBALAMIN 1000 MCG/ML IJ SOLN
INTRAMUSCULAR | Status: AC
  Filled 2012-02-15: qty 1

## 2012-02-15 MED ORDER — SODIUM CHLORIDE 0.9 % IV SOLN
1800.0000 mg/m2 | INTRAVENOUS | Status: DC
  Administered 2012-02-15: 3800 mg via INTRAVENOUS
  Filled 2012-02-15 (×2): qty 76

## 2012-02-15 MED ORDER — CYANOCOBALAMIN 1000 MCG/ML IJ SOLN
1000.0000 ug | Freq: Once | INTRAMUSCULAR | Status: AC
  Administered 2012-02-15: 1000 ug via INTRAMUSCULAR

## 2012-02-15 MED ORDER — LEUCOVORIN CALCIUM INJECTION 350 MG
20.0000 mg/m2 | Freq: Once | INTRAVENOUS | Status: DC
  Filled 2012-02-15: qty 2.1

## 2012-02-15 MED ORDER — OXALIPLATIN CHEMO INJECTION 100 MG/20ML
42.5000 mg/m2 | Freq: Once | INTRAVENOUS | Status: AC
  Administered 2012-02-15: 90 mg via INTRAVENOUS
  Filled 2012-02-15: qty 18

## 2012-02-15 MED ORDER — DEXTROSE 5 % IV SOLN
50.0000 mL | INTRAVENOUS | Status: AC
  Filled 2012-02-15 (×2): qty 50

## 2012-02-15 MED ORDER — SODIUM CHLORIDE 0.9 % IJ SOLN
10.0000 mL | INTRAMUSCULAR | Status: DC | PRN
  Administered 2012-02-15: 10 mL
  Filled 2012-02-15: qty 10

## 2012-02-15 NOTE — Telephone Encounter (Signed)
Don't know why this is showing that I opened the encounter.

## 2012-02-16 ENCOUNTER — Other Ambulatory Visit (HOSPITAL_COMMUNITY): Payer: Managed Care, Other (non HMO)

## 2012-02-17 ENCOUNTER — Encounter (HOSPITAL_BASED_OUTPATIENT_CLINIC_OR_DEPARTMENT_OTHER): Payer: Managed Care, Other (non HMO)

## 2012-02-17 DIAGNOSIS — Z5189 Encounter for other specified aftercare: Secondary | ICD-10-CM

## 2012-02-17 DIAGNOSIS — C20 Malignant neoplasm of rectum: Secondary | ICD-10-CM

## 2012-02-17 MED ORDER — SODIUM CHLORIDE 0.9 % IJ SOLN
10.0000 mL | INTRAMUSCULAR | Status: DC | PRN
  Administered 2012-02-17: 10 mL
  Filled 2012-02-17: qty 10

## 2012-02-17 MED ORDER — HEPARIN SOD (PORK) LOCK FLUSH 100 UNIT/ML IV SOLN
500.0000 [IU] | Freq: Once | INTRAVENOUS | Status: AC | PRN
  Administered 2012-02-17: 500 [IU]
  Filled 2012-02-17: qty 5

## 2012-02-17 MED ORDER — SODIUM CHLORIDE 0.9 % IJ SOLN
INTRAMUSCULAR | Status: AC
  Filled 2012-02-17: qty 20

## 2012-02-17 MED ORDER — HEPARIN SOD (PORK) LOCK FLUSH 100 UNIT/ML IV SOLN
INTRAVENOUS | Status: AC
  Filled 2012-02-17: qty 5

## 2012-02-17 MED ORDER — PEGFILGRASTIM INJECTION 6 MG/0.6ML
6.0000 mg | Freq: Once | SUBCUTANEOUS | Status: AC
  Administered 2012-02-17: 6 mg via SUBCUTANEOUS

## 2012-02-17 NOTE — Progress Notes (Signed)
[name] presents today for injection per MD orders. Neulasta 6mg  administered SQ in left Abdomen.  PAC flushed and 5Fu pump removed, PAC deaccessed.   Administration without incident. Patient tolerated well.

## 2012-02-29 ENCOUNTER — Encounter (HOSPITAL_COMMUNITY): Payer: Managed Care, Other (non HMO) | Attending: Oncology

## 2012-02-29 ENCOUNTER — Other Ambulatory Visit (HOSPITAL_COMMUNITY): Payer: Self-pay | Admitting: Oncology

## 2012-02-29 DIAGNOSIS — E538 Deficiency of other specified B group vitamins: Secondary | ICD-10-CM | POA: Insufficient documentation

## 2012-02-29 DIAGNOSIS — C20 Malignant neoplasm of rectum: Secondary | ICD-10-CM | POA: Insufficient documentation

## 2012-02-29 LAB — CBC WITH DIFFERENTIAL/PLATELET
Eosinophils Absolute: 0.1 10*3/uL (ref 0.0–0.7)
Eosinophils Relative: 1 % (ref 0–5)
HCT: 36.8 % — ABNORMAL LOW (ref 39.0–52.0)
Hemoglobin: 11.9 g/dL — ABNORMAL LOW (ref 13.0–17.0)
Lymphs Abs: 0.6 10*3/uL — ABNORMAL LOW (ref 0.7–4.0)
MCH: 29.8 pg (ref 26.0–34.0)
MCV: 92.2 fL (ref 78.0–100.0)
Monocytes Absolute: 0.4 10*3/uL (ref 0.1–1.0)
Monocytes Relative: 7 % (ref 3–12)
Platelets: 29 10*3/uL — CL (ref 150–400)
WBC: 6.1 10*3/uL (ref 4.0–10.5)

## 2012-02-29 LAB — COMPREHENSIVE METABOLIC PANEL
BUN: 9 mg/dL (ref 6–23)
Calcium: 9.3 mg/dL (ref 8.4–10.5)
Creatinine, Ser: 1.06 mg/dL (ref 0.50–1.35)
GFR calc Af Amer: 87 mL/min — ABNORMAL LOW (ref >90)
Glucose, Bld: 150 mg/dL — ABNORMAL HIGH (ref 70–99)
Sodium: 138 mEq/L (ref 135–145)
Total Protein: 6.1 g/dL (ref 6.0–8.3)

## 2012-02-29 MED ORDER — SODIUM CHLORIDE 0.9 % IJ SOLN
20.0000 mL | INTRAMUSCULAR | Status: DC | PRN
  Administered 2012-02-29: 20 mL via INTRAVENOUS
  Filled 2012-02-29: qty 20

## 2012-02-29 MED ORDER — HEPARIN SOD (PORK) LOCK FLUSH 100 UNIT/ML IV SOLN
500.0000 [IU] | Freq: Once | INTRAVENOUS | Status: AC
  Administered 2012-02-29: 500 [IU] via INTRAVENOUS
  Filled 2012-02-29: qty 5

## 2012-02-29 MED ORDER — HEPARIN SOD (PORK) LOCK FLUSH 100 UNIT/ML IV SOLN
INTRAVENOUS | Status: AC
  Filled 2012-02-29: qty 5

## 2012-02-29 NOTE — Progress Notes (Signed)
Patient held due to labs, instructed to make appointment for next week on his way out.  Patient verbalized understanding.

## 2012-03-01 ENCOUNTER — Ambulatory Visit (HOSPITAL_COMMUNITY): Payer: Managed Care, Other (non HMO)

## 2012-03-02 ENCOUNTER — Ambulatory Visit (HOSPITAL_COMMUNITY): Payer: Managed Care, Other (non HMO)

## 2012-03-07 ENCOUNTER — Other Ambulatory Visit (HOSPITAL_COMMUNITY): Payer: Self-pay | Admitting: Oncology

## 2012-03-07 ENCOUNTER — Encounter (HOSPITAL_BASED_OUTPATIENT_CLINIC_OR_DEPARTMENT_OTHER): Payer: Managed Care, Other (non HMO)

## 2012-03-07 VITALS — BP 154/84 | HR 79 | Temp 98.0°F | Resp 18 | Wt 238.8 lb

## 2012-03-07 DIAGNOSIS — C20 Malignant neoplasm of rectum: Secondary | ICD-10-CM

## 2012-03-07 DIAGNOSIS — Z5111 Encounter for antineoplastic chemotherapy: Secondary | ICD-10-CM

## 2012-03-07 LAB — COMPREHENSIVE METABOLIC PANEL
AST: 30 U/L (ref 0–37)
Alkaline Phosphatase: 113 U/L (ref 39–117)
BUN: 7 mg/dL (ref 6–23)
CO2: 23 mEq/L (ref 19–32)
Chloride: 106 mEq/L (ref 96–112)
Creatinine, Ser: 1.08 mg/dL (ref 0.50–1.35)
GFR calc non Af Amer: 73 mL/min — ABNORMAL LOW (ref >90)
Potassium: 4 mEq/L (ref 3.5–5.1)
Total Bilirubin: 1.9 mg/dL — ABNORMAL HIGH (ref 0.3–1.2)

## 2012-03-07 LAB — CBC WITH DIFFERENTIAL/PLATELET
HCT: 35.7 % — ABNORMAL LOW (ref 39.0–52.0)
Hemoglobin: 11.7 g/dL — ABNORMAL LOW (ref 13.0–17.0)
Lymphocytes Relative: 10 % — ABNORMAL LOW (ref 12–46)
Monocytes Absolute: 0.7 10*3/uL (ref 0.1–1.0)
Monocytes Relative: 14 % — ABNORMAL HIGH (ref 3–12)
Neutro Abs: 3.5 10*3/uL (ref 1.7–7.7)
RBC: 3.87 MIL/uL — ABNORMAL LOW (ref 4.22–5.81)
WBC: 4.7 10*3/uL (ref 4.0–10.5)

## 2012-03-07 MED ORDER — SODIUM CHLORIDE 0.9 % IV SOLN
1800.0000 mg/m2 | INTRAVENOUS | Status: DC
  Administered 2012-03-07: 3800 mg via INTRAVENOUS
  Filled 2012-03-07 (×2): qty 76

## 2012-03-07 MED ORDER — SODIUM CHLORIDE 0.9 % IV SOLN
Freq: Once | INTRAVENOUS | Status: AC
  Administered 2012-03-07: 11:00:00 via INTRAVENOUS

## 2012-03-07 MED ORDER — SODIUM CHLORIDE 0.9 % IV SOLN
Freq: Once | INTRAVENOUS | Status: AC
  Administered 2012-03-07: 8 mg via INTRAVENOUS
  Filled 2012-03-07: qty 4

## 2012-03-07 MED ORDER — DEXTROSE 5 % IV SOLN
Freq: Once | INTRAVENOUS | Status: AC
  Administered 2012-03-07: 13:00:00 via INTRAVENOUS

## 2012-03-07 MED ORDER — SODIUM CHLORIDE 0.9 % IV SOLN
5.0000 mg/kg | Freq: Once | INTRAVENOUS | Status: AC
  Administered 2012-03-07: 500 mg via INTRAVENOUS
  Filled 2012-03-07: qty 20

## 2012-03-07 MED ORDER — SODIUM CHLORIDE 0.9 % IJ SOLN
10.0000 mL | INTRAMUSCULAR | Status: DC | PRN
  Administered 2012-03-07: 10 mL
  Filled 2012-03-07: qty 10

## 2012-03-07 MED ORDER — SODIUM CHLORIDE 0.9 % IV SOLN: 8.0000 mg | Freq: Once | INTRAVENOUS | Status: DC

## 2012-03-07 MED ORDER — DEXAMETHASONE SODIUM PHOSPHATE 10 MG/ML IJ SOLN: 10.0000 mg | Freq: Once | INTRAMUSCULAR | Status: DC

## 2012-03-07 MED ORDER — OXALIPLATIN CHEMO INJECTION 100 MG/20ML
42.5000 mg/m2 | Freq: Once | INTRAVENOUS | Status: AC
  Administered 2012-03-07: 90 mg via INTRAVENOUS
  Filled 2012-03-07: qty 18

## 2012-03-07 MED ORDER — FLUOROURACIL CHEMO INJECTION 2.5 GM/50ML
400.0000 mg/m2 | Freq: Once | INTRAVENOUS | Status: AC
  Administered 2012-03-07: 850 mg via INTRAVENOUS
  Filled 2012-03-07: qty 17

## 2012-03-07 MED ORDER — LEUCOVORIN CALCIUM INJECTION 100 MG
20.0000 mg/m2 | Freq: Once | INTRAMUSCULAR | Status: AC
  Administered 2012-03-07: 42 mg via INTRAVENOUS
  Filled 2012-03-07: qty 2.1

## 2012-03-07 MED ORDER — HEPARIN SOD (PORK) LOCK FLUSH 100 UNIT/ML IV SOLN
500.0000 [IU] | Freq: Once | INTRAVENOUS | Status: DC | PRN
  Filled 2012-03-07: qty 5

## 2012-03-07 NOTE — Progress Notes (Signed)
Tolerated chemo well. 

## 2012-03-09 ENCOUNTER — Encounter (HOSPITAL_BASED_OUTPATIENT_CLINIC_OR_DEPARTMENT_OTHER): Payer: Managed Care, Other (non HMO)

## 2012-03-09 DIAGNOSIS — C20 Malignant neoplasm of rectum: Secondary | ICD-10-CM

## 2012-03-09 MED ORDER — SODIUM CHLORIDE 0.9 % IJ SOLN
10.0000 mL | INTRAMUSCULAR | Status: DC | PRN
  Administered 2012-03-09: 10 mL via INTRAVENOUS
  Filled 2012-03-09: qty 10

## 2012-03-09 MED ORDER — HEPARIN SOD (PORK) LOCK FLUSH 100 UNIT/ML IV SOLN
500.0000 [IU] | Freq: Once | INTRAVENOUS | Status: AC
  Administered 2012-03-09: 500 [IU] via INTRAVENOUS
  Filled 2012-03-09: qty 5

## 2012-03-09 MED ORDER — PEGFILGRASTIM INJECTION 6 MG/0.6ML
SUBCUTANEOUS | Status: AC
  Filled 2012-03-09: qty 0.6

## 2012-03-09 MED ORDER — HEPARIN SOD (PORK) LOCK FLUSH 100 UNIT/ML IV SOLN
INTRAVENOUS | Status: AC
  Filled 2012-03-09: qty 5

## 2012-03-09 MED ORDER — SODIUM CHLORIDE 0.9 % IJ SOLN
INTRAMUSCULAR | Status: AC
  Filled 2012-03-09: qty 10

## 2012-03-09 MED ORDER — PEGFILGRASTIM INJECTION 6 MG/0.6ML
6.0000 mg | Freq: Once | SUBCUTANEOUS | Status: AC
  Administered 2012-03-09: 6 mg via SUBCUTANEOUS

## 2012-03-09 NOTE — Progress Notes (Signed)
Tolerated port flush and injection well.

## 2012-03-20 ENCOUNTER — Other Ambulatory Visit (HOSPITAL_COMMUNITY): Payer: Self-pay | Admitting: Oncology

## 2012-03-21 ENCOUNTER — Encounter (HOSPITAL_COMMUNITY): Payer: Managed Care, Other (non HMO)

## 2012-03-21 DIAGNOSIS — C20 Malignant neoplasm of rectum: Secondary | ICD-10-CM

## 2012-03-21 LAB — COMPREHENSIVE METABOLIC PANEL
ALT: 24 U/L (ref 0–53)
Alkaline Phosphatase: 138 U/L — ABNORMAL HIGH (ref 39–117)
BUN: 7 mg/dL (ref 6–23)
CO2: 24 mEq/L (ref 19–32)
Chloride: 104 mEq/L (ref 96–112)
GFR calc Af Amer: 86 mL/min — ABNORMAL LOW (ref >90)
Glucose, Bld: 177 mg/dL — ABNORMAL HIGH (ref 70–99)
Potassium: 4.2 mEq/L (ref 3.5–5.1)
Sodium: 138 mEq/L (ref 135–145)
Total Bilirubin: 2.8 mg/dL — ABNORMAL HIGH (ref 0.3–1.2)
Total Protein: 6.3 g/dL (ref 6.0–8.3)

## 2012-03-21 LAB — CBC WITH DIFFERENTIAL/PLATELET
Hemoglobin: 12 g/dL — ABNORMAL LOW (ref 13.0–17.0)
Lymphocytes Relative: 7 % — ABNORMAL LOW (ref 12–46)
Lymphs Abs: 0.4 10*3/uL — ABNORMAL LOW (ref 0.7–4.0)
MCH: 29.7 pg (ref 26.0–34.0)
Monocytes Relative: 7 % (ref 3–12)
Neutro Abs: 4.8 10*3/uL (ref 1.7–7.7)
Neutrophils Relative %: 85 % — ABNORMAL HIGH (ref 43–77)
Platelets: 30 10*3/uL — ABNORMAL LOW (ref 150–400)
RBC: 4.04 MIL/uL — ABNORMAL LOW (ref 4.22–5.81)
WBC: 5.7 10*3/uL (ref 4.0–10.5)

## 2012-03-21 MED ORDER — HEPARIN SOD (PORK) LOCK FLUSH 100 UNIT/ML IV SOLN
INTRAVENOUS | Status: AC
  Filled 2012-03-21: qty 5

## 2012-03-21 NOTE — Progress Notes (Signed)
Patient held due to lab results.  Wife concerned about patient's next scan in regards to them having to pay a deductible if it is done after this calendar year.  Spoke with Dellis Anes, PA and he stated that we would be able to scan the patient after his next cycle or before the end of the year.  Patient called and informed.

## 2012-03-23 ENCOUNTER — Encounter (HOSPITAL_COMMUNITY): Payer: Managed Care, Other (non HMO)

## 2012-03-28 ENCOUNTER — Encounter (HOSPITAL_BASED_OUTPATIENT_CLINIC_OR_DEPARTMENT_OTHER): Payer: Managed Care, Other (non HMO)

## 2012-03-28 ENCOUNTER — Other Ambulatory Visit (HOSPITAL_COMMUNITY): Payer: Self-pay | Admitting: Oncology

## 2012-03-28 VITALS — BP 130/85 | HR 89 | Temp 98.0°F | Resp 18 | Wt 235.6 lb

## 2012-03-28 DIAGNOSIS — C20 Malignant neoplasm of rectum: Secondary | ICD-10-CM

## 2012-03-28 DIAGNOSIS — Z5111 Encounter for antineoplastic chemotherapy: Secondary | ICD-10-CM

## 2012-03-28 DIAGNOSIS — E538 Deficiency of other specified B group vitamins: Secondary | ICD-10-CM

## 2012-03-28 LAB — CBC WITH DIFFERENTIAL/PLATELET
Basophils Relative: 0 % (ref 0–1)
Eosinophils Absolute: 0.1 10*3/uL (ref 0.0–0.7)
Hemoglobin: 12.4 g/dL — ABNORMAL LOW (ref 13.0–17.0)
Lymphs Abs: 0.5 10*3/uL — ABNORMAL LOW (ref 0.7–4.0)
MCH: 29.7 pg (ref 26.0–34.0)
Monocytes Relative: 12 % (ref 3–12)
Neutro Abs: 4.4 10*3/uL (ref 1.7–7.7)
Neutrophils Relative %: 78 % — ABNORMAL HIGH (ref 43–77)
Platelets: 53 10*3/uL — ABNORMAL LOW (ref 150–400)
RBC: 4.18 MIL/uL — ABNORMAL LOW (ref 4.22–5.81)

## 2012-03-28 LAB — URINALYSIS, DIPSTICK ONLY
Ketones, ur: NEGATIVE mg/dL
Leukocytes, UA: NEGATIVE
Nitrite: NEGATIVE
Specific Gravity, Urine: >1.03 — ABNORMAL HIGH (ref 1.005–1.030)
Urobilinogen, UA: 0.2 mg/dL (ref 0.0–1.0)
pH: 5.5 (ref 5.0–8.0)

## 2012-03-28 MED ORDER — SODIUM CHLORIDE 0.9 % IV SOLN: 8.0000 mg | Freq: Once | INTRAVENOUS | Status: DC

## 2012-03-28 MED ORDER — DEXTROSE 5 % IV SOLN
Freq: Once | INTRAVENOUS | Status: AC
  Administered 2012-03-28: 13:00:00 via INTRAVENOUS

## 2012-03-28 MED ORDER — LEUCOVORIN CALCIUM INJECTION 100 MG
20.0000 mg/m2 | Freq: Once | INTRAMUSCULAR | Status: AC
  Administered 2012-03-28: 42 mg via INTRAVENOUS
  Filled 2012-03-28: qty 2.1

## 2012-03-28 MED ORDER — CYANOCOBALAMIN 1000 MCG/ML IJ SOLN
1000.0000 ug | Freq: Once | INTRAMUSCULAR | Status: AC
  Administered 2012-03-28: 1000 ug via INTRAMUSCULAR

## 2012-03-28 MED ORDER — FLUOROURACIL CHEMO INJECTION 2.5 GM/50ML
400.0000 mg/m2 | Freq: Once | INTRAVENOUS | Status: AC
  Administered 2012-03-28: 850 mg via INTRAVENOUS
  Filled 2012-03-28: qty 17

## 2012-03-28 MED ORDER — CYANOCOBALAMIN 1000 MCG/ML IJ SOLN
INTRAMUSCULAR | Status: AC
  Filled 2012-03-28: qty 1

## 2012-03-28 MED ORDER — OXALIPLATIN CHEMO INJECTION 100 MG/20ML
42.5000 mg/m2 | Freq: Once | INTRAVENOUS | Status: AC
  Administered 2012-03-28: 90 mg via INTRAVENOUS
  Filled 2012-03-28: qty 18

## 2012-03-28 MED ORDER — SODIUM CHLORIDE 0.9 % IV SOLN
Freq: Once | INTRAVENOUS | Status: AC
  Administered 2012-03-28: 8 mg via INTRAVENOUS
  Filled 2012-03-28: qty 4

## 2012-03-28 MED ORDER — SODIUM CHLORIDE 0.9 % IV SOLN
5.0000 mg/kg | Freq: Once | INTRAVENOUS | Status: AC
  Administered 2012-03-28: 500 mg via INTRAVENOUS
  Filled 2012-03-28: qty 20

## 2012-03-28 MED ORDER — HEPARIN SOD (PORK) LOCK FLUSH 100 UNIT/ML IV SOLN
500.0000 [IU] | Freq: Once | INTRAVENOUS | Status: DC | PRN
  Filled 2012-03-28: qty 5

## 2012-03-28 MED ORDER — DEXAMETHASONE SODIUM PHOSPHATE 10 MG/ML IJ SOLN: 10.0000 mg | Freq: Once | INTRAMUSCULAR | Status: DC

## 2012-03-28 MED ORDER — HYDROCODONE-ACETAMINOPHEN 5-325 MG PO TABS: 1.0000 | ORAL_TABLET | ORAL | Status: AC | PRN

## 2012-03-28 MED ORDER — SODIUM CHLORIDE 0.9 % IV SOLN
1800.0000 mg/m2 | INTRAVENOUS | Status: DC
  Administered 2012-03-28: 3800 mg via INTRAVENOUS
  Filled 2012-03-28 (×2): qty 76

## 2012-03-30 ENCOUNTER — Encounter (HOSPITAL_COMMUNITY): Payer: Managed Care, Other (non HMO) | Attending: Oncology

## 2012-03-30 DIAGNOSIS — C20 Malignant neoplasm of rectum: Secondary | ICD-10-CM | POA: Insufficient documentation

## 2012-03-30 MED ORDER — LIDOCAINE-PRILOCAINE 2.5-2.5 % EX CREA: TOPICAL_CREAM | CUTANEOUS | Status: DC | PRN

## 2012-03-30 MED ORDER — PEGFILGRASTIM INJECTION 6 MG/0.6ML
6.0000 mg | Freq: Once | SUBCUTANEOUS | Status: AC
  Administered 2012-03-30: 6 mg via SUBCUTANEOUS

## 2012-03-30 MED ORDER — SODIUM CHLORIDE 0.9 % IJ SOLN
INTRAMUSCULAR | Status: AC
  Filled 2012-03-30: qty 10

## 2012-03-30 MED ORDER — SODIUM CHLORIDE 0.9 % IJ SOLN
10.0000 mL | INTRAMUSCULAR | Status: DC | PRN
  Administered 2012-03-30: 10 mL
  Filled 2012-03-30: qty 10

## 2012-03-30 MED ORDER — HEPARIN SOD (PORK) LOCK FLUSH 100 UNIT/ML IV SOLN
INTRAVENOUS | Status: AC
  Filled 2012-03-30: qty 5

## 2012-03-30 MED ORDER — PEGFILGRASTIM INJECTION 6 MG/0.6ML
SUBCUTANEOUS | Status: AC
  Filled 2012-03-30: qty 0.6

## 2012-03-30 MED ORDER — HEPARIN SOD (PORK) LOCK FLUSH 100 UNIT/ML IV SOLN
500.0000 [IU] | Freq: Once | INTRAVENOUS | Status: AC | PRN
  Administered 2012-03-30: 500 [IU]
  Filled 2012-03-30: qty 5

## 2012-03-30 NOTE — Progress Notes (Signed)
[name] presented for Portacath access and flush.  Proper placement of portacath confirmed by CXR.  Portacath located left chest wall accessed with  H 20 needle.  Good blood return present. Portacath flushed with 20ml NS and 500U/66ml Heparin and needle removed intact.  Procedure without incident.  Patient tolerated procedure well.  [name] also requested a refill of EMLA cream, po contrast given for CT scheduled this Monday.  Neulasta administered SQ, see MAR for details.

## 2012-03-30 NOTE — Patient Instructions (Signed)
[facility] Radiology Department  Instructions for CT Abdomen/Pelvis  AZAD CALAME, your exam is scheduled for Monday at 1pm in the afternoon.  **WARNING** IF YOU ARE ALLERGIC TO IODINE/X-RAY DYE, PLEASE NOTIFY us IMMEDIATELY.  YOU MAY NEED ADDITIONAL PRE-MEDICATIONS THE DAY PRIOR TO YOUR EXAM.   Please follow these instructions on the day of your exam.   Do not eat or drink anything after 11am.  (2 hours prior to the exam time)   You will be given two bottles of ReadiCat oral contrast solution to drink.  The solution may taste better if refrigerated, but do not add ice.  SHAKE WELL BEFORE DRINKING   Drink one bottle of ReadiCat (barium solution) at 11am.  (2 hours prior to your exam)   Drink one bottle of ReadiCat (barium solution) at 12.  (1 hour prior to your exam)   You may take any medications as prescribed, with a small amount of water, if necessary.   Please arrive 30 minutes prior to appointment time to register.   Come in and report to Short Stay to register.   If registering in any area other than Radiology, notify the Radiology front office upon your arrival in the Radiology Department and they will notify the CT Department.  The purpose of you drinking the oral contrast solution (ReadiCat) is to aid in the visualization of your intestinal tract.  The contrast solution may cause some diarrhea. Before your exam is started, you will be given a small amount of water to drink.  Depending on your individual set of symptoms, you may also receive an intravenous injection of x-ray contrast/iodine.  Plan on being in Radiology for 30-60 minutes or longer, depending on the type of exam you are having performed.  If you have any questions regarding your procedure, you may call the CT Department at [phone].

## 2012-04-02 ENCOUNTER — Ambulatory Visit (HOSPITAL_COMMUNITY)
Admit: 2012-04-02 | Discharge: 2012-04-02 | Disposition: A | Discharge status: Home / Self Care | Payer: Managed Care, Other (non HMO) | Source: Ambulatory Visit | Attending: Oncology | Admitting: Oncology

## 2012-04-02 DIAGNOSIS — R918 Other nonspecific abnormal finding of lung field: Secondary | ICD-10-CM | POA: Insufficient documentation

## 2012-04-02 DIAGNOSIS — C20 Malignant neoplasm of rectum: Secondary | ICD-10-CM | POA: Insufficient documentation

## 2012-04-02 DIAGNOSIS — C785 Secondary malignant neoplasm of large intestine and rectum: Secondary | ICD-10-CM | POA: Insufficient documentation

## 2012-04-02 MED ORDER — IOHEXOL 300 MG/ML  SOLN
100.0000 mL | Freq: Once | INTRAMUSCULAR | Status: AC | PRN
  Administered 2012-04-02: 100 mL via INTRAVENOUS

## 2012-04-04 ENCOUNTER — Encounter (HOSPITAL_COMMUNITY): Payer: Self-pay | Admitting: Oncology

## 2012-04-04 ENCOUNTER — Other Ambulatory Visit (HOSPITAL_COMMUNITY): Payer: Self-pay | Admitting: Oncology

## 2012-04-04 ENCOUNTER — Encounter (HOSPITAL_BASED_OUTPATIENT_CLINIC_OR_DEPARTMENT_OTHER): Payer: Managed Care, Other (non HMO) | Admitting: Oncology

## 2012-04-04 VITALS — BP 128/81 | HR 94 | Temp 97.0°F | Resp 22 | Wt 231.7 lb

## 2012-04-04 DIAGNOSIS — D696 Thrombocytopenia, unspecified: Secondary | ICD-10-CM

## 2012-04-04 DIAGNOSIS — C787 Secondary malignant neoplasm of liver and intrahepatic bile duct: Secondary | ICD-10-CM

## 2012-04-04 DIAGNOSIS — C20 Malignant neoplasm of rectum: Secondary | ICD-10-CM

## 2012-04-04 DIAGNOSIS — C7800 Secondary malignant neoplasm of unspecified lung: Secondary | ICD-10-CM

## 2012-04-04 NOTE — Patient Instructions (Addendum)
[facility]  Discharge Instructions  RECOMMENDATIONS MADE BY THE CONSULTANT AND ANY TEST RESULTS WILL BE SENT TO YOUR REFERRING DOCTOR.  We will make a referral to Radiation to see if they can treat the lung nodule.  We will continue with your current plan for chemotherapy.  Report any issues/concerns to clinic as needed.  Return to see MD in 2 months.  I acknowledge that I have been informed and understand all the instructions given to me and received a copy. I do not have any more questions at this time, but understand that I may call the Specialty Clinic at [facility] at [phone] during business hours should I have any further questions or need assistance in obtaining follow-up care.    __________________________________________  _____________  __________ Signature of Patient or Authorized Representative            Date                   Time    __________________________________________ Nurse's Signature

## 2012-04-04 NOTE — Progress Notes (Signed)
[name], MD [address] [place] [address]  1. Adenocarcinoma of rectum     CURRENT THERAPY:S/P 19 cycles of FOLFOX + Avastin chemotherapy. Oxaliplatin was reduced by 50% and 5-FU continuous infusion is reduced by 75%.    INTERVAL HISTORY: [name] 61 y.o. 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   Jamarri is doing very well. He is here today to review his CT scan results.  I personally reviewed and went over radiographic studies with the patient.  There is some discordance with his CT scan results. His liver continues to improve with his metastatic lesions, but his pulmonary nodules have grown in size since his last restaging scans. We spent some time discussing this.  I have discussed the patient's case with Dr. Thersa Salt, and he agrees that it is reasonable for the patient be seen in consultation with radiation oncology for this enlarging lung lesion. I discussed this with the patient and he is agreeable to have this consultation done. Therefore, I will refer her the patient to Dr. Thersa Salt (radiation oncology) for consideration of radiation to his single lung lesion.    Marshawn reports some shortness of breath and has been ongoing for greater than 2 months. He denies any sudden onset or chest pain. This is likely from deconditioning. He denies any worsening of his shortness of breath.  The patient reports he is lost approximately 4-5 pounds but he is trying to. He is monitoring his food intake since in the past few months he has increased his weight. So he has a little that her weight loss but is trying to lose weight. He weighs in today at 231 pounds and so he can use a few pounds of weight loss.  Otherwise, Stirling denies any oncologic complaints. He is doing very well. We'll continue with therapy as tolerated. We will hold therapy when his blood counts not  need treatment parameters. We will see the radiation oncology has offer this patient for his upper lung single lesion    Past Medical History  Diagnosis Date  . Cancer     Colon ca dx 2008 surg/rad/chemo  . Pancytopenia, acquired 12/04/2010  . B12 deficiency 12/07/2010  . Lung cancer   . Hypertension   . Diabetes mellitus   . Hypercholesteremia     has DIABETES MELLITUS, TYPE II; HYPERCHOLESTEROLEMIA; ANEMIA, CHRONIC; CORONARY ARTERY DISEASE; SLEEP APNEA; Adenocarcinoma of rectum; Pancytopenia, acquired; and B12 deficiency on his problem list.     is allergic to daypro.  Mr. Hilleary had no medications administered during this visit.  Past Surgical History  Procedure Date  . Ileostomy 12/07/2010    Procedure: ILEOSTOMY;  Surgeon: [name];  Location: [facility];  Service: General;  Laterality: N/A;  Diverting Ileostomy Lysis of adhesions Exploratory Laparotomy  . Lung biopsy     rt lobe  . Cholecystectomy     Denies any headaches, dizziness, double vision, fevers, chills, night sweats, nausea, vomiting, diarrhea, constipation, chest pain, heart palpitations, blood in stool, black tarry stool, urinary pain, urinary burning, urinary frequency, hematuria.   PHYSICAL EXAMINATION  ECOG PERFORMANCE STATUS: 1 - Symptomatic but completely ambulatory  Filed Vitals:   04/04/12 1100  BP: 128/81  Pulse: 94  Temp: 97 F (36.1 C)  Resp: 22    GENERAL:alert, no distress, well nourished, well developed, comfortable, cooperative, obese and smiling SKIN: skin color,  texture, turgor are normal, no rashes or significant lesions HEAD: Normocephalic, No masses, lesions, tenderness or abnormalities EYES: normal, Conjunctiva are pink and non-injected EARS: External ears normal OROPHARYNX:lips, buccal mucosa, and tongue normal, mucous membranes are moist and poor dentition  NECK: supple, trachea midline LYMPH:  not examined BREAST:not examined LUNGS: clear to auscultation and  percussion HEART: regular rate & rhythm, no murmurs, no gallops, S1 normal and S2 normal ABDOMEN:abdomen soft, non-tender, obese and normal bowel sounds, colostomy producing stool appropriately. BACK: Back symmetric, no curvature., No CVA tenderness EXTREMITIES:less then 2 second capillary refill, no joint deformities, effusion, or inflammation, no edema, no skin discoloration, no clubbing, no cyanosis  NEURO: alert & oriented x 3 with fluent speech, no focal motor/sensory deficits, gait normal    LABORATORY DATA: CBC    Component Value Date/Time   WBC 5.7 03/28/2012 1001   RBC 4.18* 03/28/2012 1001   HGB 12.4* 03/28/2012 1001   HCT 37.7* 03/28/2012 1001   PLT 53* 03/28/2012 1001   MCV 90.2 03/28/2012 1001   MCH 29.7 03/28/2012 1001   MCHC 32.9 03/28/2012 1001   RDW 18.7* 03/28/2012 1001   LYMPHSABS 0.5* 03/28/2012 1001   MONOABS 0.7 03/28/2012 1001   EOSABS 0.1 03/28/2012 1001   BASOSABS 0.0 03/28/2012 1001      Chemistry      Component Value Date/Time   NA 138 03/21/2012 1017   K 4.2 03/21/2012 1017   CL 104 03/21/2012 1017   CO2 24 03/21/2012 1017   BUN 7 03/21/2012 1017   CREATININE 1.07 03/21/2012 1017      Component Value Date/Time   CALCIUM 9.4 03/21/2012 1017   ALKPHOS 138* 03/21/2012 1017   AST 31 03/21/2012 1017   ALT 24 03/21/2012 1017   BILITOT 2.8* 03/21/2012 1017       RADIOGRAPHIC STUDIES:  Ct Chest W Contrast  04/02/2012  *RADIOLOGY REPORT*  Clinical Data:  Restaging metastatic colorectal cancer.  Pulmonary nodule.  CT CHEST, ABDOMEN AND PELVIS WITH CONTRAST  Technique:  Multidetector CT imaging of the chest, abdomen and pelvis was performed following the standard protocol during bolus administration of intravenous contrast.  Contrast: OMNIPAQUE IOHEXOL 300 MG/ML  SOLN  Comparison:  CT 11/30/2011.  PET CT 07/11/2011.  CT CHEST  Findings:  Left subclavian Port-A-Cath is unchanged within the SVC. There are no enlarged mediastinal or hilar lymph  nodes.  There is no pleural or pericardial effusion.  Diffuse coronary artery calcifications are again noted.  The lobulated and slightly spiculated right upper lobe nodule shows continued growth, measuring 1.9 x 1.5 cm on image 18 (1.6 x 1.4 cm previously as remeasured). This shows mild central cavitation. There are no other suspicious nodules.  A tiny subpleural nodule the right middle lobe on image 45 is stable.  There are no suspicious osseous findings. The probable sebaceous cyst in the upper right neck is unchanged, measuring 3.2 cm in diameter.  IMPRESSION: Further enlargement of the right upper lobe macrolobulated and slightly spiculated nodule. This remains concerning for bronchogenic carcinoma; an isolated pulmonary metastasis is considered less likely.  CT ABDOMEN AND PELVIS  Findings:  The liver, biliary system and pancreas appear unremarkable status post cholecystectomy.  There is stable mild splenomegaly with a probable infarct posteriorly in the spleen.  No new splenic lesions are identified.  There is no adrenal mass.  Bilateral renal cysts are again noted. There are two large calculi within the left renal pelvis as before. There is no high-grade  obstruction at the ureteral pelvic junction. There are nonobstructing calyceal calculi in the lower poles of both kidneys.  There is scarring in the lower pole of the right kidney.  There are stable postsurgical changes status post ileostomy, subtotal colectomy and diverting colostomy.  The rectosigmoid anastomosis appears stable. There is stable mild rectal wall thickening and perirectal soft tissue stranding.  A small focus of probable fat necrosis within the left omentum on image 68 is stable.  There are no enlarged abdominal pelvic lymph nodes.  There is no ascites or peritoneal nodularity.  The bladder, prostate gland and seminal vesicles appear unremarkable.  There are no acute or suspicious osseous findings.  IMPRESSION:  1.  No residual metastatic  disease or local recurrence identified in the abdomen or pelvis. 2.  Stable splenomegaly and splenic infarct. 3.  Stable bilateral renal cysts and calculi.  There are two sizable calculi within the left renal pelvis which may be intermittently obstructing the ureteral pelvic junction.   Original Report Authenticated By: Carey Bullocks, M.D.    Ct Abdomen Pelvis W Contrast  04/02/2012  *RADIOLOGY REPORT*  Clinical Data:  Restaging metastatic colorectal cancer.  Pulmonary nodule.  CT CHEST, ABDOMEN AND PELVIS WITH CONTRAST  Technique:  Multidetector CT imaging of the chest, abdomen and pelvis was performed following the standard protocol during bolus administration of intravenous contrast.  Contrast: OMNIPAQUE IOHEXOL 300 MG/ML  SOLN  Comparison:  CT 11/30/2011.  PET CT 07/11/2011.  CT CHEST  Findings:  Left subclavian Port-A-Cath is unchanged within the SVC. There are no enlarged mediastinal or hilar lymph nodes.  There is no pleural or pericardial effusion.  Diffuse coronary artery calcifications are again noted.  The lobulated and slightly spiculated right upper lobe nodule shows continued growth, measuring 1.9 x 1.5 cm on image 18 (1.6 x 1.4 cm previously as remeasured). This shows mild central cavitation. There are no other suspicious nodules.  A tiny subpleural nodule the right middle lobe on image 45 is stable.  There are no suspicious osseous findings. The probable sebaceous cyst in the upper right neck is unchanged, measuring 3.2 cm in diameter.  IMPRESSION: Further enlargement of the right upper lobe macrolobulated and slightly spiculated nodule. This remains concerning for bronchogenic carcinoma; an isolated pulmonary metastasis is considered less likely.  CT ABDOMEN AND PELVIS  Findings:  The liver, biliary system and pancreas appear unremarkable status post cholecystectomy.  There is stable mild splenomegaly with a probable infarct posteriorly in the spleen.  No new splenic lesions are  identified.  There is no adrenal mass.  Bilateral renal cysts are again noted. There are two large calculi within the left renal pelvis as before. There is no high-grade obstruction at the ureteral pelvic junction. There are nonobstructing calyceal calculi in the lower poles of both kidneys.  There is scarring in the lower pole of the right kidney.  There are stable postsurgical changes status post ileostomy, subtotal colectomy and diverting colostomy.  The rectosigmoid anastomosis appears stable. There is stable mild rectal wall thickening and perirectal soft tissue stranding.  A small focus of probable fat necrosis within the left omentum on image 68 is stable.  There are no enlarged abdominal pelvic lymph nodes.  There is no ascites or peritoneal nodularity.  The bladder, prostate gland and seminal vesicles appear unremarkable.  There are no acute or suspicious osseous findings.  IMPRESSION:  1.  No residual metastatic disease or local recurrence identified in the abdomen or pelvis. 2.  Stable splenomegaly and splenic infarct. 3.  Stable bilateral renal cysts and calculi.  There are two sizable calculi within the left renal pelvis which may be intermittently obstructing the ureteral pelvic junction.   Original Report Authenticated By: Carey Bullocks, M.D.       ASSESSMENT:  1. 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 S/P 19 treatments of FOLFOX + Avastin chemotherapy. Oxaliplatin was reduced by 50% and 5-FU continuous infusion is reduced by 75% Beginning on 04/11/2011. 2. Thrombocytopenia, secondary to chemotherapy administration. Will follow closely. Will hold chemotherapy for a platelet count < 45,000  3. B12 Deficiency 4. Deconditioning 5. Intentional weight loss   PLAN:  1. I personally reviewed and went over radiographic studies with the patient. 2. Refer the patient to radiation oncology for consideration of radiation to his single lung lesion.  3. Continue with therapy as  scheduled and as tolerated.  4. Pre-chemo therapy lab work ordered as a standing order 5. CEA monthly 6. Return in 8 weeks for follow-up.   All questions were answered. The patient knows to call the clinic with any problems, questions or concerns. We can certainly see the patient much sooner if necessary.  The patient and plan discussed with Glenford Peers, MD and he is in agreement with the aforementioned.  Karina Nofsinger

## 2012-04-10 ENCOUNTER — Other Ambulatory Visit (HOSPITAL_COMMUNITY): Payer: Managed Care, Other (non HMO)

## 2012-04-11 ENCOUNTER — Encounter (HOSPITAL_COMMUNITY): Payer: Managed Care, Other (non HMO)

## 2012-04-11 ENCOUNTER — Telehealth (HOSPITAL_COMMUNITY): Payer: Self-pay

## 2012-04-11 DIAGNOSIS — C20 Malignant neoplasm of rectum: Secondary | ICD-10-CM

## 2012-04-11 LAB — CBC WITH DIFFERENTIAL/PLATELET
Basophils Relative: 0 % (ref 0–1)
Hemoglobin: 11.8 g/dL — ABNORMAL LOW (ref 13.0–17.0)
Lymphocytes Relative: 8 % — ABNORMAL LOW (ref 12–46)
Lymphs Abs: 0.4 10*3/uL — ABNORMAL LOW (ref 0.7–4.0)
MCHC: 32.3 g/dL (ref 30.0–36.0)
Monocytes Relative: 11 % (ref 3–12)
Neutro Abs: 4.1 10*3/uL (ref 1.7–7.7)
Neutrophils Relative %: 79 % — ABNORMAL HIGH (ref 43–77)
RBC: 4.05 MIL/uL — ABNORMAL LOW (ref 4.22–5.81)
WBC: 5.2 10*3/uL (ref 4.0–10.5)

## 2012-04-11 LAB — COMPREHENSIVE METABOLIC PANEL
Albumin: 3.2 g/dL — ABNORMAL LOW (ref 3.5–5.2)
Alkaline Phosphatase: 142 U/L — ABNORMAL HIGH (ref 39–117)
BUN: 7 mg/dL (ref 6–23)
Chloride: 106 mEq/L (ref 96–112)
Potassium: 4.3 mEq/L (ref 3.5–5.1)
Total Bilirubin: 2.3 mg/dL — ABNORMAL HIGH (ref 0.3–1.2)

## 2012-04-11 LAB — CEA: CEA: 2.6 ng/mL (ref 0.0–5.0)

## 2012-04-11 MED ORDER — SODIUM CHLORIDE 0.9 % IJ SOLN
INTRAMUSCULAR | Status: AC
  Filled 2012-04-11: qty 10

## 2012-04-11 MED ORDER — HEPARIN SOD (PORK) LOCK FLUSH 100 UNIT/ML IV SOLN
INTRAVENOUS | Status: AC
  Filled 2012-04-11: qty 5

## 2012-04-11 NOTE — Progress Notes (Signed)
Chemo held today due to low plts. Will try again in one week.

## 2012-04-11 NOTE — Progress Notes (Signed)
Chemo held due to low plts.Marland Kitchen

## 2012-04-11 NOTE — Telephone Encounter (Signed)
CRITICAL VALUE ALERT Critical value received:  Platelet count 25,000  Date of notification:  04/11/12 Time of notification: 1140 Critical value read back:  yes Nurse who received alert:  Tobie Lords, RN Dellis Anes PA notified @ [phone]

## 2012-04-13 ENCOUNTER — Encounter (HOSPITAL_COMMUNITY): Payer: Managed Care, Other (non HMO)

## 2012-04-18 ENCOUNTER — Other Ambulatory Visit (HOSPITAL_COMMUNITY): Payer: Self-pay | Admitting: Oncology

## 2012-04-18 ENCOUNTER — Encounter (HOSPITAL_BASED_OUTPATIENT_CLINIC_OR_DEPARTMENT_OTHER): Payer: Managed Care, Other (non HMO)

## 2012-04-18 ENCOUNTER — Encounter (HOSPITAL_COMMUNITY): Payer: Managed Care, Other (non HMO)

## 2012-04-18 VITALS — BP 124/80 | HR 80

## 2012-04-18 DIAGNOSIS — C20 Malignant neoplasm of rectum: Secondary | ICD-10-CM

## 2012-04-18 DIAGNOSIS — Z5111 Encounter for antineoplastic chemotherapy: Secondary | ICD-10-CM

## 2012-04-18 DIAGNOSIS — C787 Secondary malignant neoplasm of liver and intrahepatic bile duct: Secondary | ICD-10-CM

## 2012-04-18 DIAGNOSIS — C7800 Secondary malignant neoplasm of unspecified lung: Secondary | ICD-10-CM

## 2012-04-18 LAB — CBC WITH DIFFERENTIAL/PLATELET
Basophils Absolute: 0 10*3/uL (ref 0.0–0.1)
Basophils Relative: 0 % (ref 0–1)
Eosinophils Absolute: 0.1 10*3/uL (ref 0.0–0.7)
Eosinophils Relative: 2 % (ref 0–5)
HCT: 36.5 % — ABNORMAL LOW (ref 39.0–52.0)
MCHC: 32.1 g/dL (ref 30.0–36.0)
MCV: 91 fL (ref 78.0–100.0)
Monocytes Absolute: 0.5 10*3/uL (ref 0.1–1.0)
Neutro Abs: 3.8 10*3/uL (ref 1.7–7.7)
RDW: 19.6 % — ABNORMAL HIGH (ref 11.5–15.5)

## 2012-04-18 LAB — URINALYSIS, DIPSTICK ONLY
Glucose, UA: NEGATIVE mg/dL
Ketones, ur: NEGATIVE mg/dL
Nitrite: NEGATIVE
pH: 6 (ref 5.0–8.0)

## 2012-04-18 MED ORDER — DEXTROSE 5 % IV SOLN
Freq: Once | INTRAVENOUS | Status: AC
  Administered 2012-04-18: 13:00:00 via INTRAVENOUS

## 2012-04-18 MED ORDER — FLUOROURACIL CHEMO INJECTION 2.5 GM/50ML
400.0000 mg/m2 | Freq: Once | INTRAVENOUS | Status: AC
  Administered 2012-04-18: 850 mg via INTRAVENOUS
  Filled 2012-04-18: qty 17

## 2012-04-18 MED ORDER — HEPARIN SOD (PORK) LOCK FLUSH 100 UNIT/ML IV SOLN
INTRAVENOUS | Status: AC
  Filled 2012-04-18: qty 5

## 2012-04-18 MED ORDER — SODIUM CHLORIDE 0.9 % IV SOLN
Freq: Once | INTRAVENOUS | Status: AC
  Administered 2012-04-18: 8 mg via INTRAVENOUS
  Filled 2012-04-18: qty 4

## 2012-04-18 MED ORDER — SODIUM CHLORIDE 0.9 % IJ SOLN
10.0000 mL | INTRAMUSCULAR | Status: DC | PRN
  Administered 2012-04-18: 10 mL
  Filled 2012-04-18: qty 10

## 2012-04-18 MED ORDER — OXALIPLATIN CHEMO INJECTION 100 MG/20ML
42.5000 mg/m2 | Freq: Once | INTRAVENOUS | Status: AC
  Administered 2012-04-18: 90 mg via INTRAVENOUS
  Filled 2012-04-18: qty 18

## 2012-04-18 MED ORDER — LEUCOVORIN CALCIUM INJECTION 100 MG
20.0000 mg/m2 | Freq: Once | INTRAMUSCULAR | Status: AC
  Administered 2012-04-18: 42 mg via INTRAVENOUS
  Filled 2012-04-18: qty 2.1

## 2012-04-18 MED ORDER — SODIUM CHLORIDE 0.9 % IV SOLN
5.0000 mg/kg | Freq: Once | INTRAVENOUS | Status: AC
  Administered 2012-04-18: 500 mg via INTRAVENOUS
  Filled 2012-04-18: qty 20

## 2012-04-18 MED ORDER — SODIUM CHLORIDE 0.9 % IJ SOLN
INTRAMUSCULAR | Status: AC
  Filled 2012-04-18: qty 10

## 2012-04-18 MED ORDER — SODIUM CHLORIDE 0.9 % IV SOLN
1800.0000 mg/m2 | INTRAVENOUS | Status: DC
  Administered 2012-04-18: 3800 mg via INTRAVENOUS
  Filled 2012-04-18 (×2): qty 76

## 2012-04-18 MED ORDER — SODIUM CHLORIDE 0.9 % IV SOLN
Freq: Once | INTRAVENOUS | Status: AC
  Administered 2012-04-18: 11:00:00 via INTRAVENOUS

## 2012-04-18 NOTE — Progress Notes (Signed)
Tolerated well

## 2012-04-20 ENCOUNTER — Encounter (HOSPITAL_BASED_OUTPATIENT_CLINIC_OR_DEPARTMENT_OTHER): Payer: Managed Care, Other (non HMO)

## 2012-04-20 VITALS — BP 129/65 | HR 94

## 2012-04-20 DIAGNOSIS — Z5189 Encounter for other specified aftercare: Secondary | ICD-10-CM

## 2012-04-20 DIAGNOSIS — C20 Malignant neoplasm of rectum: Secondary | ICD-10-CM

## 2012-04-20 MED ORDER — PEGFILGRASTIM INJECTION 6 MG/0.6ML
6.0000 mg | Freq: Once | SUBCUTANEOUS | Status: AC
  Administered 2012-04-20: 6 mg via SUBCUTANEOUS

## 2012-04-20 MED ORDER — PEGFILGRASTIM INJECTION 6 MG/0.6ML
SUBCUTANEOUS | Status: AC
  Filled 2012-04-20: qty 0.6

## 2012-04-20 MED ORDER — HEPARIN SOD (PORK) LOCK FLUSH 100 UNIT/ML IV SOLN
INTRAVENOUS | Status: AC
Start: 2012-04-20 — End: 2012-04-20
  Filled 2012-04-20: qty 5

## 2012-04-20 NOTE — Progress Notes (Signed)
Neulasta 6 mg given im to left abd tissue.  Tolerated well.

## 2012-05-02 ENCOUNTER — Other Ambulatory Visit (HOSPITAL_COMMUNITY): Payer: Managed Care, Other (non HMO)

## 2012-05-02 ENCOUNTER — Inpatient Hospital Stay (HOSPITAL_COMMUNITY): Payer: Managed Care, Other (non HMO)

## 2012-05-04 ENCOUNTER — Encounter (HOSPITAL_COMMUNITY): Payer: Managed Care, Other (non HMO)

## 2012-05-09 ENCOUNTER — Other Ambulatory Visit (HOSPITAL_COMMUNITY): Payer: Managed Care, Other (non HMO)

## 2012-05-09 ENCOUNTER — Encounter (HOSPITAL_COMMUNITY): Payer: Managed Care, Other (non HMO) | Attending: Oncology

## 2012-05-09 VITALS — BP 132/77 | HR 95 | Temp 97.3°F | Resp 18 | Wt 236.0 lb

## 2012-05-09 DIAGNOSIS — C20 Malignant neoplasm of rectum: Secondary | ICD-10-CM | POA: Insufficient documentation

## 2012-05-09 DIAGNOSIS — C787 Secondary malignant neoplasm of liver and intrahepatic bile duct: Secondary | ICD-10-CM

## 2012-05-09 DIAGNOSIS — R82998 Other abnormal findings in urine: Secondary | ICD-10-CM | POA: Insufficient documentation

## 2012-05-09 DIAGNOSIS — Z5111 Encounter for antineoplastic chemotherapy: Secondary | ICD-10-CM

## 2012-05-09 DIAGNOSIS — R809 Proteinuria, unspecified: Secondary | ICD-10-CM | POA: Insufficient documentation

## 2012-05-09 DIAGNOSIS — C7800 Secondary malignant neoplasm of unspecified lung: Secondary | ICD-10-CM

## 2012-05-09 DIAGNOSIS — E538 Deficiency of other specified B group vitamins: Secondary | ICD-10-CM | POA: Insufficient documentation

## 2012-05-09 LAB — CBC WITH DIFFERENTIAL/PLATELET
Basophils Relative: 0 % (ref 0–1)
Eosinophils Absolute: 0.1 10*3/uL (ref 0.0–0.7)
Eosinophils Relative: 1 % (ref 0–5)
Hemoglobin: 11.1 g/dL — ABNORMAL LOW (ref 13.0–17.0)
Lymphs Abs: 0.5 10*3/uL — ABNORMAL LOW (ref 0.7–4.0)
MCH: 28.2 pg (ref 26.0–34.0)
MCHC: 31.7 g/dL (ref 30.0–36.0)
MCV: 88.8 fL (ref 78.0–100.0)
Monocytes Absolute: 0.7 10*3/uL (ref 0.1–1.0)
Monocytes Relative: 14 % — ABNORMAL HIGH (ref 3–12)
RBC: 3.94 MIL/uL — ABNORMAL LOW (ref 4.22–5.81)

## 2012-05-09 LAB — COMPREHENSIVE METABOLIC PANEL
Albumin: 3 g/dL — ABNORMAL LOW (ref 3.5–5.2)
Alkaline Phosphatase: 100 U/L (ref 39–117)
BUN: 9 mg/dL (ref 6–23)
Calcium: 8.8 mg/dL (ref 8.4–10.5)
Creatinine, Ser: 1.15 mg/dL (ref 0.50–1.35)
GFR calc Af Amer: 79 mL/min — ABNORMAL LOW (ref >90)
Glucose, Bld: 176 mg/dL — ABNORMAL HIGH (ref 70–99)
Total Protein: 5.9 g/dL — ABNORMAL LOW (ref 6.0–8.3)

## 2012-05-09 LAB — URINALYSIS, DIPSTICK ONLY
Specific Gravity, Urine: >1.03 — ABNORMAL HIGH (ref 1.005–1.030)
Urobilinogen, UA: 1 mg/dL (ref 0.0–1.0)
pH: 5 (ref 5.0–8.0)

## 2012-05-09 MED ORDER — NITROFURANTOIN MONOHYD MACRO 100 MG PO CAPS: 100.0000 mg | ORAL_CAPSULE | Freq: Two times a day (BID) | ORAL | Status: DC

## 2012-05-09 MED ORDER — OXALIPLATIN CHEMO INJECTION 100 MG/20ML
42.5000 mg/m2 | Freq: Once | INTRAVENOUS | Status: AC
  Administered 2012-05-09: 90 mg via INTRAVENOUS
  Filled 2012-05-09: qty 18

## 2012-05-09 MED ORDER — SODIUM CHLORIDE 0.9 % IV SOLN
5.0000 mg/kg | Freq: Once | INTRAVENOUS | Status: AC
  Administered 2012-05-09: 500 mg via INTRAVENOUS
  Filled 2012-05-09: qty 20

## 2012-05-09 MED ORDER — CYANOCOBALAMIN 1000 MCG/ML IJ SOLN
1000.0000 ug | Freq: Once | INTRAMUSCULAR | Status: AC
  Administered 2012-05-09: 1000 ug via INTRAMUSCULAR

## 2012-05-09 MED ORDER — FLUOROURACIL CHEMO INJECTION 2.5 GM/50ML
400.0000 mg/m2 | Freq: Once | INTRAVENOUS | Status: AC
  Administered 2012-05-09: 850 mg via INTRAVENOUS
  Filled 2012-05-09: qty 17

## 2012-05-09 MED ORDER — SODIUM CHLORIDE 0.9 % IV SOLN
Freq: Once | INTRAVENOUS | Status: AC
  Administered 2012-05-09: 11:00:00 via INTRAVENOUS

## 2012-05-09 MED ORDER — SODIUM CHLORIDE 0.9 % IV SOLN
Freq: Once | INTRAVENOUS | Status: AC
  Administered 2012-05-09: 8 mg via INTRAVENOUS
  Filled 2012-05-09: qty 4

## 2012-05-09 MED ORDER — CYANOCOBALAMIN 1000 MCG/ML IJ SOLN
INTRAMUSCULAR | Status: AC
  Filled 2012-05-09: qty 1

## 2012-05-09 MED ORDER — DEXTROSE 5 % IV SOLN
Freq: Once | INTRAVENOUS | Status: AC
  Administered 2012-05-09: 12:00:00 via INTRAVENOUS

## 2012-05-09 MED ORDER — LEUCOVORIN CALCIUM INJECTION 100 MG
20.0000 mg/m2 | Freq: Once | INTRAMUSCULAR | Status: AC
  Administered 2012-05-09: 42 mg via INTRAVENOUS
  Filled 2012-05-09: qty 2.1

## 2012-05-09 MED ORDER — SODIUM CHLORIDE 0.9 % IV SOLN
1800.0000 mg/m2 | INTRAVENOUS | Status: DC
  Administered 2012-05-09: 3800 mg via INTRAVENOUS
  Filled 2012-05-09 (×2): qty 76

## 2012-05-09 NOTE — Progress Notes (Signed)
[name] presents today for injection per MD orders. B12 1000 mcg administered SQ in left Upper Arm. Administration without incident. Patient tolerated well.

## 2012-05-10 ENCOUNTER — Other Ambulatory Visit (HOSPITAL_COMMUNITY): Payer: Self-pay | Admitting: Oncology

## 2012-05-10 LAB — CEA: CEA: 3.2 ng/mL (ref 0.0–5.0)

## 2012-05-11 ENCOUNTER — Encounter (HOSPITAL_BASED_OUTPATIENT_CLINIC_OR_DEPARTMENT_OTHER): Payer: Managed Care, Other (non HMO)

## 2012-05-11 DIAGNOSIS — R809 Proteinuria, unspecified: Secondary | ICD-10-CM

## 2012-05-11 DIAGNOSIS — C7800 Secondary malignant neoplasm of unspecified lung: Secondary | ICD-10-CM

## 2012-05-11 DIAGNOSIS — C20 Malignant neoplasm of rectum: Secondary | ICD-10-CM

## 2012-05-11 LAB — PROTEIN, URINE, 24 HOUR
Collection Interval-UPROT: 24 hours
Protein, Urine: 59 mg/dL
Urine Total Volume-UPROT: 900 mL

## 2012-05-11 MED ORDER — PEGFILGRASTIM INJECTION 6 MG/0.6ML
SUBCUTANEOUS | Status: AC
  Filled 2012-05-11: qty 0.6

## 2012-05-11 MED ORDER — SODIUM CHLORIDE 0.9 % IJ SOLN
INTRAMUSCULAR | Status: AC
  Filled 2012-05-11: qty 20

## 2012-05-11 MED ORDER — HEPARIN SOD (PORK) LOCK FLUSH 100 UNIT/ML IV SOLN
INTRAVENOUS | Status: AC
  Filled 2012-05-11: qty 5

## 2012-05-11 MED ORDER — PEGFILGRASTIM INJECTION 6 MG/0.6ML
6.0000 mg | Freq: Once | SUBCUTANEOUS | Status: AC
  Administered 2012-05-11: 6 mg via SUBCUTANEOUS

## 2012-05-11 NOTE — Progress Notes (Signed)
[name] presents today for injection per MD orders. Neulasta 6mg administered SQ in left Abdomen. Administration without incident. Patient tolerated well.  

## 2012-05-11 NOTE — Addendum Note (Signed)
Addended by: Hester Mates A on: 05/11/2012 04:31 PM   Modules accepted: Orders

## 2012-05-15 ENCOUNTER — Telehealth (HOSPITAL_COMMUNITY): Payer: Self-pay | Admitting: Oncology

## 2012-05-15 NOTE — Telephone Encounter (Signed)
Patient seen by Dr. Thersa Salt for consideration of radiation to lung nodule.    The decision from that consultation was to watch the lesion and if it grows on the patient's next surveillance scan, then the patient will call Dr. Thersa Salt back for radiation therapy.    [name]

## 2012-05-28 ENCOUNTER — Other Ambulatory Visit (HOSPITAL_COMMUNITY): Payer: Self-pay | Admitting: Oncology

## 2012-05-28 DIAGNOSIS — R05 Cough: Secondary | ICD-10-CM

## 2012-05-28 DIAGNOSIS — R058 Other specified cough: Secondary | ICD-10-CM

## 2012-05-28 MED ORDER — AMOXICILLIN-POT CLAVULANATE 875-125 MG PO TABS: 1.0000 | ORAL_TABLET | Freq: Two times a day (BID) | ORAL | Status: DC

## 2012-05-29 ENCOUNTER — Inpatient Hospital Stay (HOSPITAL_COMMUNITY): Payer: Managed Care, Other (non HMO)

## 2012-05-31 ENCOUNTER — Encounter (HOSPITAL_COMMUNITY): Payer: Managed Care, Other (non HMO)

## 2012-06-05 ENCOUNTER — Encounter (HOSPITAL_COMMUNITY): Payer: Managed Care, Other (non HMO) | Attending: Oncology

## 2012-06-05 VITALS — BP 136/84 | HR 97 | Temp 98.2°F | Resp 20 | Wt 224.6 lb

## 2012-06-05 DIAGNOSIS — Z5112 Encounter for antineoplastic immunotherapy: Secondary | ICD-10-CM

## 2012-06-05 DIAGNOSIS — C20 Malignant neoplasm of rectum: Secondary | ICD-10-CM | POA: Insufficient documentation

## 2012-06-05 DIAGNOSIS — C787 Secondary malignant neoplasm of liver and intrahepatic bile duct: Secondary | ICD-10-CM

## 2012-06-05 DIAGNOSIS — E538 Deficiency of other specified B group vitamins: Secondary | ICD-10-CM | POA: Insufficient documentation

## 2012-06-05 DIAGNOSIS — C7800 Secondary malignant neoplasm of unspecified lung: Secondary | ICD-10-CM

## 2012-06-05 DIAGNOSIS — R059 Cough, unspecified: Secondary | ICD-10-CM | POA: Insufficient documentation

## 2012-06-05 DIAGNOSIS — R05 Cough: Secondary | ICD-10-CM | POA: Insufficient documentation

## 2012-06-05 LAB — COMPREHENSIVE METABOLIC PANEL
Albumin: 2.9 g/dL — ABNORMAL LOW (ref 3.5–5.2)
Alkaline Phosphatase: 108 U/L (ref 39–117)
BUN: 18 mg/dL (ref 6–23)
Potassium: 4.2 mEq/L (ref 3.5–5.1)
Total Protein: 6 g/dL (ref 6.0–8.3)

## 2012-06-05 LAB — CBC WITH DIFFERENTIAL/PLATELET
Basophils Relative: 0 % (ref 0–1)
Eosinophils Absolute: 0.1 10*3/uL (ref 0.0–0.7)
Hemoglobin: 12.2 g/dL — ABNORMAL LOW (ref 13.0–17.0)
MCH: 28.9 pg (ref 26.0–34.0)
MCHC: 32.7 g/dL (ref 30.0–36.0)
Monocytes Relative: 13 % — ABNORMAL HIGH (ref 3–12)
Neutrophils Relative %: 77 % (ref 43–77)
Platelets: 45 10*3/uL — ABNORMAL LOW (ref 150–400)
RDW: 21.1 % — ABNORMAL HIGH (ref 11.5–15.5)

## 2012-06-05 LAB — URINALYSIS, DIPSTICK ONLY
Glucose, UA: NEGATIVE mg/dL
Protein, ur: 30 mg/dL — AB
Specific Gravity, Urine: 1.025 (ref 1.005–1.030)

## 2012-06-05 MED ORDER — LEUCOVORIN CALCIUM INJECTION 100 MG
20.0000 mg/m2 | Freq: Once | INTRAMUSCULAR | Status: AC
  Administered 2012-06-05: 42 mg via INTRAVENOUS
  Filled 2012-06-05: qty 2.1

## 2012-06-05 MED ORDER — SODIUM CHLORIDE 0.9 % IV SOLN
Freq: Once | INTRAVENOUS | Status: AC
  Administered 2012-06-05: 11:00:00 via INTRAVENOUS

## 2012-06-05 MED ORDER — SODIUM CHLORIDE 0.9 % IV SOLN
Freq: Once | INTRAVENOUS | Status: AC
  Administered 2012-06-05: 8 mg via INTRAVENOUS
  Filled 2012-06-05: qty 4

## 2012-06-05 MED ORDER — OXALIPLATIN CHEMO INJECTION 100 MG/20ML
42.5000 mg/m2 | Freq: Once | INTRAVENOUS | Status: AC
  Administered 2012-06-05: 90 mg via INTRAVENOUS
  Filled 2012-06-05: qty 18

## 2012-06-05 MED ORDER — FLUOROURACIL CHEMO INJECTION 2.5 GM/50ML
400.0000 mg/m2 | Freq: Once | INTRAVENOUS | Status: AC
  Administered 2012-06-05: 850 mg via INTRAVENOUS
  Filled 2012-06-05: qty 17

## 2012-06-05 MED ORDER — SODIUM CHLORIDE 0.9 % IV SOLN
5.0000 mg/kg | Freq: Once | INTRAVENOUS | Status: AC
  Administered 2012-06-05: 500 mg via INTRAVENOUS
  Filled 2012-06-05: qty 20

## 2012-06-05 MED ORDER — SODIUM CHLORIDE 0.9 % IV SOLN
1800.0000 mg/m2 | INTRAVENOUS | Status: DC
  Administered 2012-06-05: 3800 mg via INTRAVENOUS
  Filled 2012-06-05 (×2): qty 76

## 2012-06-05 MED ORDER — DEXTROSE 5 % IV SOLN
Freq: Once | INTRAVENOUS | Status: AC
  Administered 2012-06-05: 12:00:00 via INTRAVENOUS

## 2012-06-06 LAB — CEA: CEA: 3.7 ng/mL (ref 0.0–5.0)

## 2012-06-07 ENCOUNTER — Encounter (HOSPITAL_BASED_OUTPATIENT_CLINIC_OR_DEPARTMENT_OTHER): Payer: Managed Care, Other (non HMO)

## 2012-06-07 ENCOUNTER — Other Ambulatory Visit (HOSPITAL_COMMUNITY): Payer: Self-pay | Admitting: Oncology

## 2012-06-07 VITALS — BP 137/83 | HR 94 | Temp 96.6°F | Resp 22

## 2012-06-07 DIAGNOSIS — C20 Malignant neoplasm of rectum: Secondary | ICD-10-CM

## 2012-06-07 DIAGNOSIS — Z5189 Encounter for other specified aftercare: Secondary | ICD-10-CM

## 2012-06-07 MED ORDER — PEGFILGRASTIM INJECTION 6 MG/0.6ML
SUBCUTANEOUS | Status: AC
  Filled 2012-06-07: qty 0.6

## 2012-06-07 MED ORDER — PEGFILGRASTIM INJECTION 6 MG/0.6ML
6.0000 mg | Freq: Once | SUBCUTANEOUS | Status: AC
  Administered 2012-06-07: 6 mg via SUBCUTANEOUS

## 2012-06-07 MED ORDER — SODIUM CHLORIDE 0.9 % IJ SOLN
10.0000 mL | INTRAMUSCULAR | Status: DC | PRN
  Administered 2012-06-07: 10 mL
  Filled 2012-06-07: qty 10

## 2012-06-07 MED ORDER — HEPARIN SOD (PORK) LOCK FLUSH 100 UNIT/ML IV SOLN
INTRAVENOUS | Status: AC
  Filled 2012-06-07: qty 5

## 2012-06-07 MED ORDER — HEPARIN SOD (PORK) LOCK FLUSH 100 UNIT/ML IV SOLN
500.0000 [IU] | Freq: Once | INTRAVENOUS | Status: AC | PRN
  Administered 2012-06-07: 500 [IU]
  Filled 2012-06-07: qty 5

## 2012-06-07 NOTE — Progress Notes (Signed)
[name] presents today for injection per MD orders. Neulasta 6mg  administered SQ in left Abdomen. Administration without incident. Patient tolerated well. D/C infusion pump. Port flushed per protocol. Denies complaints.

## 2012-06-08 ENCOUNTER — Other Ambulatory Visit (HOSPITAL_COMMUNITY): Payer: Self-pay | Admitting: Oncology

## 2012-06-19 ENCOUNTER — Inpatient Hospital Stay (HOSPITAL_COMMUNITY): Payer: Managed Care, Other (non HMO)

## 2012-06-19 ENCOUNTER — Ambulatory Visit (HOSPITAL_COMMUNITY): Payer: Managed Care, Other (non HMO)

## 2012-06-26 ENCOUNTER — Encounter (HOSPITAL_BASED_OUTPATIENT_CLINIC_OR_DEPARTMENT_OTHER): Payer: Managed Care, Other (non HMO)

## 2012-06-26 ENCOUNTER — Encounter (HOSPITAL_BASED_OUTPATIENT_CLINIC_OR_DEPARTMENT_OTHER): Payer: Managed Care, Other (non HMO) | Admitting: Oncology

## 2012-06-26 DIAGNOSIS — C787 Secondary malignant neoplasm of liver and intrahepatic bile duct: Secondary | ICD-10-CM

## 2012-06-26 DIAGNOSIS — R05 Cough: Secondary | ICD-10-CM

## 2012-06-26 DIAGNOSIS — C7800 Secondary malignant neoplasm of unspecified lung: Secondary | ICD-10-CM

## 2012-06-26 DIAGNOSIS — R059 Cough, unspecified: Secondary | ICD-10-CM

## 2012-06-26 DIAGNOSIS — E538 Deficiency of other specified B group vitamins: Secondary | ICD-10-CM

## 2012-06-26 DIAGNOSIS — C20 Malignant neoplasm of rectum: Secondary | ICD-10-CM

## 2012-06-26 DIAGNOSIS — Z5112 Encounter for antineoplastic immunotherapy: Secondary | ICD-10-CM

## 2012-06-26 DIAGNOSIS — R058 Other specified cough: Secondary | ICD-10-CM

## 2012-06-26 LAB — CBC WITH DIFFERENTIAL/PLATELET
Basophils Relative: 0 % (ref 0–1)
Eosinophils Absolute: 0 10*3/uL (ref 0.0–0.7)
MCH: 29.2 pg (ref 26.0–34.0)
MCHC: 32.5 g/dL (ref 30.0–36.0)
Neutro Abs: 4.4 10*3/uL (ref 1.7–7.7)
Neutrophils Relative %: 81 % — ABNORMAL HIGH (ref 43–77)
Platelets: 56 10*3/uL — ABNORMAL LOW (ref 150–400)
RBC: 4.21 MIL/uL — ABNORMAL LOW (ref 4.22–5.81)

## 2012-06-26 LAB — URINALYSIS, DIPSTICK ONLY
Glucose, UA: NEGATIVE mg/dL
Protein, ur: 100 mg/dL — AB

## 2012-06-26 LAB — COMPREHENSIVE METABOLIC PANEL
ALT: 12 U/L (ref 0–53)
AST: 26 U/L (ref 0–37)
Albumin: 3.1 g/dL — ABNORMAL LOW (ref 3.5–5.2)
Alkaline Phosphatase: 112 U/L (ref 39–117)
Chloride: 96 mEq/L (ref 96–112)
Potassium: 3.7 mEq/L (ref 3.5–5.1)
Sodium: 132 mEq/L — ABNORMAL LOW (ref 135–145)
Total Protein: 6.1 g/dL (ref 6.0–8.3)

## 2012-06-26 MED ORDER — SODIUM CHLORIDE 0.9 % IV SOLN
Freq: Once | INTRAVENOUS | Status: AC
  Administered 2012-06-26: 8 mg via INTRAVENOUS
  Filled 2012-06-26: qty 4

## 2012-06-26 MED ORDER — LEUCOVORIN CALCIUM INJECTION 100 MG
20.0000 mg/m2 | Freq: Once | INTRAMUSCULAR | Status: AC
  Administered 2012-06-26: 42 mg via INTRAVENOUS
  Filled 2012-06-26: qty 2.1

## 2012-06-26 MED ORDER — CYANOCOBALAMIN 1000 MCG/ML IJ SOLN
INTRAMUSCULAR | Status: AC
  Filled 2012-06-26: qty 1

## 2012-06-26 MED ORDER — AZITHROMYCIN 250 MG PO TABS: ORAL_TABLET | ORAL | Status: DC

## 2012-06-26 MED ORDER — CYANOCOBALAMIN 1000 MCG/ML IJ SOLN
1000.0000 ug | Freq: Once | INTRAMUSCULAR | Status: AC
  Administered 2012-06-26: 1000 ug via INTRAMUSCULAR

## 2012-06-26 MED ORDER — FLUOROURACIL CHEMO INJECTION 2.5 GM/50ML
400.0000 mg/m2 | Freq: Once | INTRAVENOUS | Status: AC
  Administered 2012-06-26: 850 mg via INTRAVENOUS
  Filled 2012-06-26: qty 17

## 2012-06-26 MED ORDER — SODIUM CHLORIDE 0.9 % IV SOLN
1800.0000 mg/m2 | INTRAVENOUS | Status: DC
  Administered 2012-06-26: 3800 mg via INTRAVENOUS
  Filled 2012-06-26 (×2): qty 76

## 2012-06-26 MED ORDER — SODIUM CHLORIDE 0.9 % IJ SOLN
10.0000 mL | INTRAMUSCULAR | Status: DC | PRN
  Filled 2012-06-26: qty 10

## 2012-06-26 MED ORDER — SODIUM CHLORIDE 0.9 % IV SOLN: Freq: Once | INTRAVENOUS | Status: DC

## 2012-06-26 MED ORDER — OXALIPLATIN CHEMO INJECTION 100 MG/20ML
42.5000 mg/m2 | Freq: Once | INTRAVENOUS | Status: AC
  Administered 2012-06-26: 90 mg via INTRAVENOUS
  Filled 2012-06-26: qty 18

## 2012-06-26 MED ORDER — SODIUM CHLORIDE 0.9 % IV SOLN
5.0000 mg/kg | Freq: Once | INTRAVENOUS | Status: AC
  Administered 2012-06-26: 500 mg via INTRAVENOUS
  Filled 2012-06-26: qty 20

## 2012-06-26 NOTE — Progress Notes (Signed)
[name] is here for chemotherapy and after discussion with the nurse, I was asked to see him before he was started on chemotherapy today.    So Savannah had a cough in late December that was productive so I treated him with Augmentin.  This resolved, but over the weekend, he started to develop a cough productive of green sputum.  He reports that he feels well.  He denies any fevers or chills.  He denies any shaking chills.  He denies any headaches.  He does admit to clear nasal discharge.    I personally reviewed and went over laboratory results with the patient.  His lab parameters are met and chemotherapy administration is approved today.  I suspect he has an early URI, so I will treat him with a Z-Pak.  He will et chemotherapy today.  KEFALAS,THOMAS

## 2012-06-26 NOTE — Progress Notes (Signed)
[name] presents today for injection and chemo per MD orders. C/O cough for 2 weeks. Samuella Bruin PA in to assess pt and antibiotics escribed. B12 1000 mcg administered SQ in left Upper Arm. Administration without incident. Patient tolerated chemo  well.

## 2012-06-27 LAB — CEA: CEA: 1.8 ng/mL (ref 0.0–5.0)

## 2012-06-28 ENCOUNTER — Encounter (HOSPITAL_BASED_OUTPATIENT_CLINIC_OR_DEPARTMENT_OTHER): Payer: Managed Care, Other (non HMO)

## 2012-06-28 DIAGNOSIS — C7800 Secondary malignant neoplasm of unspecified lung: Secondary | ICD-10-CM

## 2012-06-28 DIAGNOSIS — C20 Malignant neoplasm of rectum: Secondary | ICD-10-CM

## 2012-06-28 DIAGNOSIS — Z5189 Encounter for other specified aftercare: Secondary | ICD-10-CM

## 2012-06-28 DIAGNOSIS — C787 Secondary malignant neoplasm of liver and intrahepatic bile duct: Secondary | ICD-10-CM

## 2012-06-28 MED ORDER — HEPARIN SOD (PORK) LOCK FLUSH 100 UNIT/ML IV SOLN
INTRAVENOUS | Status: AC
  Filled 2012-06-28: qty 5

## 2012-06-28 MED ORDER — SODIUM CHLORIDE 0.9 % IJ SOLN
10.0000 mL | INTRAMUSCULAR | Status: DC | PRN
  Administered 2012-06-28: 10 mL
  Filled 2012-06-28: qty 10

## 2012-06-28 MED ORDER — PEGFILGRASTIM INJECTION 6 MG/0.6ML
SUBCUTANEOUS | Status: AC
  Filled 2012-06-28: qty 0.6

## 2012-06-28 MED ORDER — PEGFILGRASTIM INJECTION 6 MG/0.6ML
6.0000 mg | Freq: Once | SUBCUTANEOUS | Status: AC
  Administered 2012-06-28: 6 mg via SUBCUTANEOUS

## 2012-06-28 MED ORDER — HEPARIN SOD (PORK) LOCK FLUSH 100 UNIT/ML IV SOLN
500.0000 [IU] | Freq: Once | INTRAVENOUS | Status: AC | PRN
  Administered 2012-06-28: 500 [IU]
  Filled 2012-06-28: qty 5

## 2012-06-28 NOTE — Progress Notes (Signed)
[name] presents today for injection per MD orders. Neulasta 6mg  administered SQ in left Abdomen. Administration without incident. Patient tolerated well. [name] presented for Portacath deaccess and flush after continous 65fu.Marland Kitchen Proper placement of portacath confirmed by CXR. Good blood return present. Portacath flushed with 20ml NS and 500U/74ml Heparin and needle removed intact. Procedure without incident. Patient tolerated procedure well.

## 2012-07-09 ENCOUNTER — Other Ambulatory Visit (HOSPITAL_COMMUNITY): Payer: Self-pay | Admitting: Oncology

## 2012-07-09 DIAGNOSIS — C20 Malignant neoplasm of rectum: Secondary | ICD-10-CM

## 2012-07-09 MED ORDER — ONDANSETRON HCL 8 MG PO TABS: ORAL_TABLET | ORAL | Status: DC

## 2012-07-10 ENCOUNTER — Other Ambulatory Visit (HOSPITAL_COMMUNITY): Payer: Self-pay

## 2012-07-10 ENCOUNTER — Encounter (HOSPITAL_BASED_OUTPATIENT_CLINIC_OR_DEPARTMENT_OTHER): Payer: Managed Care, Other (non HMO) | Admitting: Oncology

## 2012-07-10 ENCOUNTER — Ambulatory Visit (HOSPITAL_COMMUNITY)
Admit: 2012-07-10 | Discharge: 2012-07-10 | Disposition: A | Discharge status: Home / Self Care | Payer: Managed Care, Other (non HMO) | Source: Ambulatory Visit | Attending: Oncology | Admitting: Oncology

## 2012-07-10 ENCOUNTER — Encounter (HOSPITAL_COMMUNITY): Payer: Managed Care, Other (non HMO) | Attending: Oncology

## 2012-07-10 DIAGNOSIS — C787 Secondary malignant neoplasm of liver and intrahepatic bile duct: Secondary | ICD-10-CM

## 2012-07-10 DIAGNOSIS — C7800 Secondary malignant neoplasm of unspecified lung: Secondary | ICD-10-CM

## 2012-07-10 DIAGNOSIS — C20 Malignant neoplasm of rectum: Secondary | ICD-10-CM

## 2012-07-10 DIAGNOSIS — Q619 Cystic kidney disease, unspecified: Secondary | ICD-10-CM | POA: Insufficient documentation

## 2012-07-10 DIAGNOSIS — E538 Deficiency of other specified B group vitamins: Secondary | ICD-10-CM | POA: Insufficient documentation

## 2012-07-10 DIAGNOSIS — R1031 Right lower quadrant pain: Secondary | ICD-10-CM | POA: Insufficient documentation

## 2012-07-10 DIAGNOSIS — R9389 Abnormal findings on diagnostic imaging of other specified body structures: Secondary | ICD-10-CM | POA: Insufficient documentation

## 2012-07-10 DIAGNOSIS — D696 Thrombocytopenia, unspecified: Secondary | ICD-10-CM

## 2012-07-10 LAB — URINALYSIS, DIPSTICK ONLY
Glucose, UA: NEGATIVE mg/dL
Specific Gravity, Urine: >1.03 — ABNORMAL HIGH (ref 1.005–1.030)
pH: 6 (ref 5.0–8.0)

## 2012-07-10 LAB — COMPREHENSIVE METABOLIC PANEL
ALT: 14 U/L (ref 0–53)
AST: 25 U/L (ref 0–37)
Albumin: 3.2 g/dL — ABNORMAL LOW (ref 3.5–5.2)
Alkaline Phosphatase: 127 U/L — ABNORMAL HIGH (ref 39–117)
Calcium: 9.1 mg/dL (ref 8.4–10.5)
Glucose, Bld: 122 mg/dL — ABNORMAL HIGH (ref 70–99)
Potassium: 3.8 mEq/L (ref 3.5–5.1)
Sodium: 137 mEq/L (ref 135–145)
Total Protein: 6.2 g/dL (ref 6.0–8.3)

## 2012-07-10 LAB — CBC WITH DIFFERENTIAL/PLATELET
Basophils Absolute: 0 10*3/uL (ref 0.0–0.1)
Eosinophils Absolute: 0.1 10*3/uL (ref 0.0–0.7)
Eosinophils Relative: 2 % (ref 0–5)
Lymphs Abs: 0.5 10*3/uL — ABNORMAL LOW (ref 0.7–4.0)
MCH: 29.6 pg (ref 26.0–34.0)
MCV: 90.8 fL (ref 78.0–100.0)
Neutrophils Relative %: 73 % (ref 43–77)
Platelets: 38 10*3/uL — ABNORMAL LOW (ref 150–400)
RBC: 4.02 MIL/uL — ABNORMAL LOW (ref 4.22–5.81)
RDW: 22.1 % — ABNORMAL HIGH (ref 11.5–15.5)
WBC: 3.8 10*3/uL — ABNORMAL LOW (ref 4.0–10.5)

## 2012-07-10 MED ORDER — SODIUM CHLORIDE 0.9 % IJ SOLN
10.0000 mL | INTRAMUSCULAR | Status: DC | PRN
  Administered 2012-07-10: 10 mL
  Filled 2012-07-10: qty 10

## 2012-07-10 MED ORDER — ONDANSETRON HCL 8 MG PO TABS: ORAL_TABLET | ORAL | Status: DC

## 2012-07-10 MED ORDER — HEPARIN SOD (PORK) LOCK FLUSH 100 UNIT/ML IV SOLN
INTRAVENOUS | Status: AC
  Filled 2012-07-10: qty 5

## 2012-07-10 MED ORDER — HEPARIN SOD (PORK) LOCK FLUSH 100 UNIT/ML IV SOLN
500.0000 [IU] | Freq: Once | INTRAVENOUS | Status: AC
  Administered 2012-07-10: 500 [IU] via INTRAVENOUS
  Filled 2012-07-10: qty 5

## 2012-07-10 NOTE — Progress Notes (Signed)
[name] Ribas, MD [address] [place] [address]  1. Adenocarcinoma of rectum  ondansetron (ZOFRAN) 8 MG tablet   ondansetron (ZOFRAN) 8 MG tablet    CURRENT THERAPY: S/P 23 treatments of FOLFOX + Avastin.  Oxaliplatin was reduced by 50% and 5-FU continuous infusion is reduced by 75%.    INTERVAL HISTORY: [name] 62 y.o. 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   [name] saw Rad Onc, [name], who has agreed to irradiate [name]'s single lung lesion.  [name] has a past history of tobacco abuse and this solitary lesion has grown slowly over time despite all other areas of activity improving with treatment.  [name] has decided to wait for his next staging scans and see what the lung lesion is doing.  If it has increased in size, he will pursue radiation.  [name] informed [name] that radiation would take place in [name].     [name] occassionally has epistaxis after a "hard nose blowing" episode.  It stops relatively quickly.  This is likely complicated by his low platelet count.  [name] reports that when he was taking the antibiotic I prescribed him, he had nausea and vomiting.  He completed the course of antibiotic because he thought the nausea and vomiting was secondary to chemotherapy.  He has not had any issues since completing the antibiotic.  In addition to that, he reports he feels much improved since finishing the antibiotic.   His last staging scan were in November.  As a result, we will repeat CT CAP end of Feb or beginning of March.  Pending these results, he will contact Rad Onc.  Otherwise, [name] denies any complaints.  Oncologic ROS questioning is negative.   On PE, I note a rash on the anterior portion of [name]'s neck.  It is erythematous witch some dry patches.  It appears to be tinea versicolor.  We will not treat since he is  asymptomatic.  If the rash becomes symptomatic, I would not hesitate to treat with an antifungal.   [name] is having difficulty with urinating, as a result, [name] has ordered an renal US to evaluate for renal calculus since he has a strong history of renal calculi.   Past Medical History  Diagnosis Date  . Cancer     Colon ca dx 2008 surg/rad/chemo  . Pancytopenia, acquired 12/04/2010  . B12 deficiency 12/07/2010  . Lung cancer   . Hypertension   . Diabetes mellitus   . Hypercholesteremia     has DIABETES MELLITUS, TYPE II; HYPERCHOLESTEROLEMIA; ANEMIA, CHRONIC; CORONARY ARTERY DISEASE; SLEEP APNEA; Adenocarcinoma of rectum; Pancytopenia, acquired; and B12 deficiency on his problem list.     is allergic to daypro.  [name] had no medications administered during this visit.  Past Surgical History  Procedure Laterality Date  . Ileostomy  12/07/2010    Procedure: ILEOSTOMY;  Surgeon: [name];  Location: [facility];  Service: General;  Laterality: N/A;  Diverting Ileostomy Lysis of adhesions Exploratory Laparotomy  . Lung biopsy      rt lobe  . Cholecystectomy      Denies any headaches, dizziness, double vision, fevers, chills, night sweats, nausea, vomiting, diarrhea, constipation, chest pain, heart palpitations, shortness of breath, blood in stool, black tarry stool, urinary pain, urinary burning, urinary frequency, hematuria.   PHYSICAL EXAMINATION  ECOG PERFORMANCE STATUS: 1 -  Symptomatic but completely ambulatory  There were no vitals filed for this visit.  GENERAL:alert, no distress, well nourished, well developed, comfortable, cooperative, obese and smiling SKIN: skin color, texture, turgor are normal, no rashes or significant lesions HEAD: Normocephalic, No masses, lesions, tenderness or abnormalities EYES: normal, Conjunctiva are pink and non-injected EARS: External ears normal OROPHARYNX:mucous membranes are moist  NECK: supple, trachea midline,  erythematous rash on anterior neck.  It is not pruritic or painful. LYMPH:  no palpable lymphadenopathy BREAST:not examined LUNGS: clear to auscultation  HEART: regular rate & rhythm, no murmurs, no gallops, S1 normal and S2 normal ABDOMEN:abdomen soft, non-tender and normal bowel sounds BACK: Back symmetric, no curvature. EXTREMITIES:less then 2 second capillary refill, no joint deformities, effusion, or inflammation, no edema, no skin discoloration, no clubbing, no cyanosis  NEURO: alert & oriented x 3 with fluent speech, no focal motor/sensory deficits, gait normal    LABORATORY DATA: CBC    Component Value Date/Time   WBC 3.8* 07/10/2012 1004   RBC 4.02* 07/10/2012 1004   HGB 11.9* 07/10/2012 1004   HCT 36.5* 07/10/2012 1004   PLT 38* 07/10/2012 1004   MCV 90.8 07/10/2012 1004   MCH 29.6 07/10/2012 1004   MCHC 32.6 07/10/2012 1004   RDW 22.1* 07/10/2012 1004   LYMPHSABS 0.5* 07/10/2012 1004   MONOABS 0.5 07/10/2012 1004   EOSABS 0.1 07/10/2012 1004   BASOSABS 0.0 07/10/2012 1004      Chemistry      Component Value Date/Time   NA 137 07/10/2012 1004   K 3.8 07/10/2012 1004   CL 102 07/10/2012 1004   CO2 21 07/10/2012 1004   BUN 12 07/10/2012 1004   CREATININE 2.13* 07/10/2012 1004      Component Value Date/Time   CALCIUM 9.1 07/10/2012 1004   ALKPHOS 127* 07/10/2012 1004   AST 25 07/10/2012 1004   ALT 14 07/10/2012 1004   BILITOT 3.1* 07/10/2012 1004        ASSESSMENT: 1. 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 S/P 19 treatments of FOLFOX + Avastin chemotherapy. Oxaliplatin was reduced by 50% and 5-FU continuous infusion is reduced by 75% Beginning on 04/11/2011.  2. Thrombocytopenia, secondary to chemotherapy administration. Will follow closely. Will hold chemotherapy for a platelet count < 45,000  3. B12 Deficiency  4. Deconditioning    PLAN:  1. I personally reviewed and went over laboratory results with the patient. 2. Chemotherapy held today due to Continue  with chemotherapy as scheduled.  3. CT CAP end of this month for restaging purposes.  4. Pending results of lung lesion, patient will contact Rad Onc.  5. Chemotherapy deferred x 7 days due to thrombocytopenia.  6. Pre-chemotherapy labs ordered: CBC diff, CMET, UA 7. CEA every 4 weeks.  8. Rx for Zofran e-scribed to [facility]. 9. Return beginning of March for follow-up.    All questions were answered. The patient knows to call the clinic with any problems, questions or concerns. We can certainly see the patient much sooner if necessary.  The patient and plan discussed with Glenford Peers, MD and he is in agreement with the aforementioned.  Trigger Frasier

## 2012-07-10 NOTE — Progress Notes (Signed)
[name] presented for Portacath access and flush, lab draw and poss chemo. Proper placement of portacath confirmed by CXR. Portacath located lt chest wall accessed with  H 20 needle. Good blood return present. Portacath flushed with 20ml NS and 500U/63ml Heparin and needle removed intact. Procedure without incident. Patient tolerated procedure well.  Patient c/o back pain and passing several small, "sand-like" stones after last treatment. Has h/o kidney stones. He also had more nausea this treatment. Labs returned and chemo held due to low platelets. Renal u/s ordered for today.

## 2012-07-11 ENCOUNTER — Other Ambulatory Visit (HOSPITAL_COMMUNITY): Payer: Self-pay | Admitting: Oncology

## 2012-07-11 DIAGNOSIS — C20 Malignant neoplasm of rectum: Secondary | ICD-10-CM

## 2012-07-12 ENCOUNTER — Ambulatory Visit (HOSPITAL_COMMUNITY): Payer: Managed Care, Other (non HMO) | Admitting: Oncology

## 2012-07-12 ENCOUNTER — Encounter (HOSPITAL_COMMUNITY): Payer: Managed Care, Other (non HMO)

## 2012-07-16 ENCOUNTER — Other Ambulatory Visit (HOSPITAL_COMMUNITY): Payer: Managed Care, Other (non HMO)

## 2012-07-16 ENCOUNTER — Ambulatory Visit (HOSPITAL_COMMUNITY)
Admit: 2012-07-16 | Discharge: 2012-07-16 | Disposition: A | Discharge status: Home / Self Care | Payer: Managed Care, Other (non HMO) | Attending: Oncology | Admitting: Oncology

## 2012-07-16 ENCOUNTER — Inpatient Hospital Stay (HOSPITAL_COMMUNITY): Payer: Managed Care, Other (non HMO)

## 2012-07-16 ENCOUNTER — Ambulatory Visit (HOSPITAL_COMMUNITY)
Admit: 2012-07-16 | Discharge: 2012-07-16 | Disposition: A | Discharge status: Home / Self Care | Payer: Managed Care, Other (non HMO) | Source: Ambulatory Visit | Attending: Oncology | Admitting: Oncology

## 2012-07-16 DIAGNOSIS — R161 Splenomegaly, not elsewhere classified: Secondary | ICD-10-CM | POA: Insufficient documentation

## 2012-07-16 DIAGNOSIS — R911 Solitary pulmonary nodule: Secondary | ICD-10-CM | POA: Insufficient documentation

## 2012-07-16 DIAGNOSIS — C20 Malignant neoplasm of rectum: Secondary | ICD-10-CM | POA: Insufficient documentation

## 2012-07-16 DIAGNOSIS — N200 Calculus of kidney: Secondary | ICD-10-CM | POA: Insufficient documentation

## 2012-07-17 ENCOUNTER — Encounter (HOSPITAL_BASED_OUTPATIENT_CLINIC_OR_DEPARTMENT_OTHER): Payer: Managed Care, Other (non HMO)

## 2012-07-17 ENCOUNTER — Encounter (HOSPITAL_COMMUNITY): Payer: Managed Care, Other (non HMO)

## 2012-07-17 DIAGNOSIS — C787 Secondary malignant neoplasm of liver and intrahepatic bile duct: Secondary | ICD-10-CM

## 2012-07-17 DIAGNOSIS — C20 Malignant neoplasm of rectum: Secondary | ICD-10-CM

## 2012-07-17 DIAGNOSIS — Z5112 Encounter for antineoplastic immunotherapy: Secondary | ICD-10-CM

## 2012-07-17 DIAGNOSIS — E538 Deficiency of other specified B group vitamins: Secondary | ICD-10-CM

## 2012-07-17 DIAGNOSIS — C7800 Secondary malignant neoplasm of unspecified lung: Secondary | ICD-10-CM

## 2012-07-17 LAB — CBC WITH DIFFERENTIAL/PLATELET
Eosinophils Absolute: 0 10*3/uL (ref 0.0–0.7)
Eosinophils Relative: 1 % (ref 0–5)
Hemoglobin: 12.2 g/dL — ABNORMAL LOW (ref 13.0–17.0)
Lymphs Abs: 0.5 10*3/uL — ABNORMAL LOW (ref 0.7–4.0)
MCH: 29.5 pg (ref 26.0–34.0)
MCHC: 32.9 g/dL (ref 30.0–36.0)
MCV: 89.6 fL (ref 78.0–100.0)
Monocytes Relative: 14 % — ABNORMAL HIGH (ref 3–12)
RBC: 4.14 MIL/uL — ABNORMAL LOW (ref 4.22–5.81)

## 2012-07-17 LAB — COMPREHENSIVE METABOLIC PANEL
BUN: 14 mg/dL (ref 6–23)
Calcium: 9.2 mg/dL (ref 8.4–10.5)
Creatinine, Ser: 1.8 mg/dL — ABNORMAL HIGH (ref 0.50–1.35)
GFR calc Af Amer: 46 mL/min — ABNORMAL LOW (ref >90)
Glucose, Bld: 151 mg/dL — ABNORMAL HIGH (ref 70–99)
Total Protein: 6.1 g/dL (ref 6.0–8.3)

## 2012-07-17 LAB — URINALYSIS, DIPSTICK ONLY
Glucose, UA: NEGATIVE mg/dL
Protein, ur: 100 mg/dL — AB
Specific Gravity, Urine: >1.03 — ABNORMAL HIGH (ref 1.005–1.030)
pH: 5.5 (ref 5.0–8.0)

## 2012-07-17 LAB — CEA: CEA: 3 ng/mL (ref 0.0–5.0)

## 2012-07-17 MED ORDER — SODIUM CHLORIDE 0.9 % IJ SOLN
10.0000 mL | INTRAMUSCULAR | Status: AC | PRN
  Administered 2012-07-17: 10 mL via INTRAVENOUS
  Filled 2012-07-17: qty 10

## 2012-07-17 MED ORDER — DEXTROSE 5 % IV SOLN
42.5000 mg/m2 | Freq: Once | INTRAVENOUS | Status: AC
  Administered 2012-07-17: 90 mg via INTRAVENOUS
  Filled 2012-07-17: qty 18

## 2012-07-17 MED ORDER — CYANOCOBALAMIN 1000 MCG/ML IJ SOLN
INTRAMUSCULAR | Status: AC
  Filled 2012-07-17: qty 1

## 2012-07-17 MED ORDER — SODIUM CHLORIDE 0.9 % IV SOLN
5.0000 mg/kg | Freq: Once | INTRAVENOUS | Status: AC
  Administered 2012-07-17: 500 mg via INTRAVENOUS
  Filled 2012-07-17: qty 20

## 2012-07-17 MED ORDER — SODIUM CHLORIDE 0.9 % IV SOLN
1800.0000 mg/m2 | INTRAVENOUS | Status: DC
  Administered 2012-07-17: 3800 mg via INTRAVENOUS
  Filled 2012-07-17: qty 76

## 2012-07-17 MED ORDER — FLUOROURACIL CHEMO INJECTION 2.5 GM/50ML
400.0000 mg/m2 | Freq: Once | INTRAVENOUS | Status: AC
  Administered 2012-07-17: 850 mg via INTRAVENOUS
  Filled 2012-07-17: qty 17

## 2012-07-17 MED ORDER — CYANOCOBALAMIN 1000 MCG/ML IJ SOLN
1000.0000 ug | Freq: Once | INTRAMUSCULAR | Status: AC
  Administered 2012-07-17: 1000 ug via INTRAMUSCULAR

## 2012-07-17 MED ORDER — SODIUM CHLORIDE 0.9 % IV SOLN
INTRAVENOUS | Status: DC
  Administered 2012-07-17: 11:00:00 via INTRAVENOUS

## 2012-07-17 MED ORDER — HEPARIN SOD (PORK) LOCK FLUSH 100 UNIT/ML IV SOLN
500.0000 [IU] | Freq: Once | INTRAVENOUS | Status: AC
  Filled 2012-07-17: qty 5

## 2012-07-17 MED ORDER — LEUCOVORIN CALCIUM INJECTION 100 MG
20.0000 mg/m2 | Freq: Once | INTRAMUSCULAR | Status: AC
  Administered 2012-07-17: 42 mg via INTRAVENOUS
  Filled 2012-07-17: qty 2.1

## 2012-07-17 MED ORDER — SODIUM CHLORIDE 0.9 % IV SOLN
Freq: Once | INTRAVENOUS | Status: AC
  Administered 2012-07-17: 8 mg via INTRAVENOUS
  Filled 2012-07-17: qty 4

## 2012-07-17 NOTE — Progress Notes (Signed)
I personally reviewed and went over radiographic studies with the patient.  Patient seen by Dr. Thersa Salt for lung lesion that is worrisome for bronchogenic carcinoma.  He also has active colon cancer.  He was referred to Rad Onc for consideration of radiation to this lesion.    Ct showed that this lesion is enlarging. He was informed by Dr. Thersa Salt that radiation will need to take place in [employer].  Since this lesion has grown, Mur[place] is more interested in Radiation therapy.   Will refer the patient to Dr. Kathrynn Running (Rad Onc) in [place] for radiation therapy.   Rasa Degrazia

## 2012-07-17 NOTE — Progress Notes (Signed)
Tolerated chemo dwell.  Continuous infusion pump settings verified with T. New, RN

## 2012-07-19 ENCOUNTER — Encounter (HOSPITAL_BASED_OUTPATIENT_CLINIC_OR_DEPARTMENT_OTHER): Payer: Managed Care, Other (non HMO)

## 2012-07-19 VITALS — BP 115/66 | HR 78 | Temp 97.6°F | Resp 16

## 2012-07-19 DIAGNOSIS — C787 Secondary malignant neoplasm of liver and intrahepatic bile duct: Secondary | ICD-10-CM

## 2012-07-19 DIAGNOSIS — Z5189 Encounter for other specified aftercare: Secondary | ICD-10-CM

## 2012-07-19 DIAGNOSIS — C7800 Secondary malignant neoplasm of unspecified lung: Secondary | ICD-10-CM

## 2012-07-19 DIAGNOSIS — C20 Malignant neoplasm of rectum: Secondary | ICD-10-CM

## 2012-07-19 MED ORDER — HEPARIN SOD (PORK) LOCK FLUSH 100 UNIT/ML IV SOLN
500.0000 [IU] | Freq: Once | INTRAVENOUS | Status: AC | PRN
  Administered 2012-07-19: 500 [IU]
  Filled 2012-07-19: qty 5

## 2012-07-19 MED ORDER — SODIUM CHLORIDE 0.9 % IJ SOLN
10.0000 mL | INTRAMUSCULAR | Status: DC | PRN
  Administered 2012-07-19: 10 mL
  Filled 2012-07-19: qty 10

## 2012-07-19 MED ORDER — PEGFILGRASTIM INJECTION 6 MG/0.6ML
SUBCUTANEOUS | Status: AC
  Filled 2012-07-19: qty 0.6

## 2012-07-19 MED ORDER — HEPARIN SOD (PORK) LOCK FLUSH 100 UNIT/ML IV SOLN
INTRAVENOUS | Status: AC
  Filled 2012-07-19: qty 5

## 2012-07-19 MED ORDER — PEGFILGRASTIM INJECTION 6 MG/0.6ML
6.0000 mg | Freq: Once | SUBCUTANEOUS | Status: AC
  Administered 2012-07-19: 6 mg via SUBCUTANEOUS

## 2012-07-19 NOTE — Progress Notes (Signed)
[name] presents today for injection per MD orders. Neulasta 6mg  administered SQ in left Abdomen. Administration without incident. Patient tolerated well.

## 2012-07-20 ENCOUNTER — Encounter: Payer: Self-pay | Admitting: Oncology

## 2012-07-23 ENCOUNTER — Encounter: Payer: Self-pay | Admitting: Radiation Oncology

## 2012-07-23 ENCOUNTER — Ambulatory Visit
Admit: 2012-07-23 | Discharge: 2012-07-23 | Disposition: A | Discharge status: Home / Self Care | Payer: Managed Care, Other (non HMO) | Attending: Radiation Oncology | Admitting: Radiation Oncology

## 2012-07-23 VITALS — BP 105/89 | HR 78 | Temp 98.5°F | Resp 20 | Wt 211.4 lb

## 2012-07-23 DIAGNOSIS — C20 Malignant neoplasm of rectum: Secondary | ICD-10-CM | POA: Insufficient documentation

## 2012-07-23 DIAGNOSIS — Z79899 Other long term (current) drug therapy: Secondary | ICD-10-CM | POA: Insufficient documentation

## 2012-07-23 DIAGNOSIS — C7800 Secondary malignant neoplasm of unspecified lung: Secondary | ICD-10-CM | POA: Insufficient documentation

## 2012-07-23 DIAGNOSIS — Z87891 Personal history of nicotine dependence: Secondary | ICD-10-CM | POA: Insufficient documentation

## 2012-07-23 DIAGNOSIS — Z8049 Family history of malignant neoplasm of other genital organs: Secondary | ICD-10-CM | POA: Insufficient documentation

## 2012-07-23 DIAGNOSIS — Z800 Family history of malignant neoplasm of digestive organs: Secondary | ICD-10-CM | POA: Insufficient documentation

## 2012-07-23 DIAGNOSIS — I10 Essential (primary) hypertension: Secondary | ICD-10-CM | POA: Insufficient documentation

## 2012-07-23 DIAGNOSIS — Z923 Personal history of irradiation: Secondary | ICD-10-CM | POA: Insufficient documentation

## 2012-07-23 DIAGNOSIS — E119 Type 2 diabetes mellitus without complications: Secondary | ICD-10-CM | POA: Insufficient documentation

## 2012-07-23 DIAGNOSIS — E7800 Pure hypercholesterolemia, unspecified: Secondary | ICD-10-CM | POA: Insufficient documentation

## 2012-07-23 DIAGNOSIS — I2510 Atherosclerotic heart disease of native coronary artery without angina pectoris: Secondary | ICD-10-CM | POA: Insufficient documentation

## 2012-07-23 HISTORY — DX: Personal history of irradiation: Z92.3

## 2012-07-23 NOTE — Progress Notes (Signed)
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  HISTORY OF PRESENT ILLNESS::Latrail R Edgett is a 62 y.o. male who underwent low anterior resection for a 2.7 cm stage T3 rectal cancer with 3 positive nodes out of 8 sampled (N1) on 04/22/2007.  Chest x-ray on 09/30/10 revealed a right upper lung nodule and subsequent chest CT confirmed the presence of a 9.6 mm soft tissue nodule in the right upper lung. PET CT on 10/13/2010 showed hypermetabolism associated with the 1 cm right upper lung nodule with a maximum SUV of 3.5.  CT-guided needle biopsy was performed on  10/27/11 showed Adenocarcinoma of Colon Primary.  There was also some question of possible liver metastases, however abdominal MRI seemed to refute this. Since that time, he has received 23 treatments of FOLFOX + Avastin. Oxaliplatin reduced by 50% and 5-FU continuous infusion is reduced by 75%.  Over that period of time, the right upper lung nodule has been relatively stable with only gradual slow enlargement. No other sites of metastatic disease have emerged. 11/30/2011 chest CT showed the right lung nodule had increased to 16 mm.  Repeat PET scan on 03/28/2011 showed some progression of the pulmonary nodule now measuring 1.2 cm in maximum SUV of 6.4.  04/02/2012 chest CT demonstrated a further slight increase to 18.8 mm.  07/16/2012 chest CT demonstrated another small increase to 22.6 mm. the patient was seen in consultation by Dr. Thersa Salt and felt to be a potential stereotactic body radiotherapy candidate. Through the courtesy of Dr. [name], the patient is seen in consultation today to discuss stereotactic body  radiotherapy.  PREVIOUS RADIATION THERAPY: Yes the patient received pelvic radiotherapy from January 20 through 07/27/2007 to a total prescription dose of 54 gray at [facility] in [place] under my care  PAST MEDICAL HISTORY:  has a past medical history of Cancer; Pancytopenia, acquired (12/04/2010); B12 deficiency (12/07/2010); Lung cancer; Hypertension; Diabetes mellitus; Hypercholesteremia; Kidney stones; CAD (coronary artery disease); Anemia; and History of radiation therapy (06/19/07 -07/27/07).    PAST SURGICAL HISTORY: Past Surgical History  Procedure Laterality Date  . Ileostomy  12/07/2010    Procedure: ILEOSTOMY;  Surgeon: [name];  Location: [facility];  Service: General;  Laterality: N/A;  Diverting Ileostomy Lysis of adhesions Exploratory Laparotomy  . Lung biopsy      rt lobe  . Cholecystectomy    . Cataract extraction w/ intraocular lens implant  2007    bil  . Lithotripsy  2002  . Coronary angioplasty with stent placement  2003    stenting x 2  . Colon resection  2008    FAMILY HISTORY: family history includes Cervical cancer in his mother; Rectal cancer in his father; and Uterine cancer in his sister.  SOCIAL HISTORY:  reports that he has quit smoking. His smoking use included Cigarettes and Cigars. He smoked 0.00 packs per day. He has quit using smokeless tobacco. His smokeless tobacco use included Chew. He reports that he does not drink alcohol or use illicit drugs.  ALLERGIES: Daypro  MEDICATIONS:  Current Outpatient Prescriptions  Medication Sig Dispense Refill  . Acetaminophen (TYLENOL ARTHRITIS PAIN PO) Take 1,200 mg by mouth daily as needed.  As needed for arthritic pain      . cyanocobalamin (,VITAMIN B-12,) 1000 MCG/ML injection Inject 1,000 mcg into the muscle every 30 (thirty) days.       Marland Kitchen dexamethasone (DECADRON) 4 MG tablet STARTING THE DAY AFTER CHEMO TAKE TWO TABLETS BY MOUTH EVERY DAY IN THE MORNING FOR 2 DAYS WITH FOOD  30 tablet  1  .  HYDROcodone-acetaminophen (NORCO/VICODIN) 5-325 MG per tablet Take 1 tablet by mouth every 6 (six) hours as needed for pain.      Marland Kitchen lidocaine-prilocaine (EMLA) cream Apply 1 application topically as needed. Apply quarter sized amount over port-a-cath site one hour prior to chemotherapy appointment.      Marland Kitchen LORazepam (ATIVAN) 1 MG tablet TAKE ONE TABLET BY MOUTH EVERY 4 HOURS AS NEEDED FOR NAUSEA AND VOMITING  30 tablet  1  . ondansetron (ZOFRAN) 8 MG tablet Take 1 tablet two times a day starting the day after chemo for 2 days. Then take 1 tablet two times a day as needed for nausea or vomiting.  30 tablet  1  . prochlorperazine (COMPAZINE) 10 MG tablet TAKE ONE TABLET BY MOUTH EVERY 6 HOURS AS NEEDED FOR NAUSEA AND VOMITING  30 tablet  0   No current facility-administered medications for this encounter.   Facility-Administered Medications Ordered in Other Encounters  Medication Dose Route Frequency Provider Last Rate Last Dose  . 0.9 %  sodium chloride infusion   Intravenous Continuous Randall An, MD 20 mL/hr at 07/17/12 1113    . heparin lock flush 100 unit/mL  500 Units Intravenous Once Randall An, MD      . sodium chloride 0.9 % injection 10 mL  10 mL Intravenous PRN Randall An, MD   10 mL at 07/17/12 1113    REVIEW OF SYSTEMS:  A 15 point review of systems is documented in the electronic medical record. This was obtained by the nursing staff. However, I reviewed this with the patient to discuss relevant findings and make appropriate changes.  A comprehensive review of systems was negative. the patient does have some fatigue with exertion.   PHYSICAL EXAM:  weight is 211 lb 6.4 oz (95.89 kg). His oral temperature is 98.5 F (36.9 C). His blood pressure is 105/89 and his pulse is 78. His respiration is 20 and oxygen saturation is 100%.   The patient is in no acute distress today. Is alert and oriented. Speech is fluent articulate. His mood and affect are appropriate. Respiratory  effort is unremarkable. Neurologically, motor strength appears to be intact with intact cranial nerves.  LABORATORY DATA:  Lab Results  Component Value Date   WBC 6.0 07/17/2012   HGB 12.2* 07/17/2012   HCT 37.1* 07/17/2012   MCV 89.6 07/17/2012   PLT 61* 07/17/2012   Lab Results  Component Value Date   NA 129* 07/17/2012   K 3.6 07/17/2012   CL 91* 07/17/2012   CO2 23 07/17/2012   Lab Results  Component Value Date   ALT 15 07/17/2012   AST 32 07/17/2012   ALKPHOS 105 07/17/2012   BILITOT 4.8* 07/17/2012     RADIOGRAPHY: Ct Abdomen Pelvis Wo Contrast  07/16/2012  *RADIOLOGY REPORT*  Clinical Data:  Restaging metastatic colorectal cancer. Chemotherapy ongoing.  CT CHEST, ABDOMEN AND PELVIS WITHOUT CONTRAST  Technique:  Multidetector CT imaging of the chest, abdomen and pelvis was performed following the standard protocol without IV contrast.  Comparison:  Renal ultrasound 07/10/2012.  CTs of the chest,  abdomen and pelvis 04/02/2012.  CT CHEST  Findings:  Left subclavian Port-A-Cath appears unchanged.  There are no enlarged mediastinal, hilar or axillary lymph nodes. Coronary artery calcifications are again noted.  There is no pleural or pericardial effusion.  The lobulated right upper lobe lesion has enlarged, measuring 2.3 x 1.6 cm (previously 1.9 x 1.5 cm).  There is increased cavitation along the superior medial margin of this lesion.  No other pulmonary nodules are identified.  There is no pleural or pericardial effusion.  Gynecomastia and a probable sebaceous cyst in the upper right back are unchanged.  IMPRESSION:  1.  Further enlargement of right upper lobe lobulated nodule, remaining concerning for bronchogenic carcinoma.  Tissue sampling recommended if not previously performed. 2.  No other nodules or adenopathy.  CT ABDOMEN AND PELVIS  Findings:  As evaluated in the noncontrast state, the liver, biliary system and pancreas appear stable status post cholecystectomy.  Splenomegaly has improved.   The probable infarct posteriorly in the spleen is unchanged.  There is no adrenal mass.  Mild renal cysts are grossly unchanged.  There are small calyceal calculi in both kidneys, larger on the left. Two large calculi are again demonstrated within the left renal pelvis as before.  The left renal pelvis is mildly dilated.  There is no perinephric soft tissue stranding or focal fluid collection.  The left ureter is normal in caliber.  The bladder appears stable.  There is no evidence of bladder calculus.  There are stable postsurgical changes status post ileostomy, subtotal colectomy and diverting colostomy.  Perirectal soft tissue stranding appears unchanged.  The colonic anastomosis appears stable.  There are no enlarged abdominal pelvic lymph nodes.  There is no ascites or peritoneal nodularity. Focus of probable fat necrosis in the left omentum on image 67 is stable.  There are no acute or worrisome osseous findings.  Chronic anterior wedging at T12 is stable.  IMPRESSION:  1.  No evidence of abdominal pelvic metastatic disease on noncontrast imaging. 2.  Stable postsurgical changes and perirectal soft tissue stranding. 3.  Grossly stable appearance of the kidneys.  There are persistent large calculi within the left renal pelvis.  No secondary signs of UPJ obstruction are demonstrated. 4.  Improved splenomegaly.   Original Report Authenticated By: Carey Bullocks, M.D.    Ct Chest Wo Contrast  07/16/2012  *RADIOLOGY REPORT*  Clinical Data:  Restaging metastatic colorectal cancer. Chemotherapy ongoing.  CT CHEST, ABDOMEN AND PELVIS WITHOUT CONTRAST  Technique:  Multidetector CT imaging of the chest, abdomen and pelvis was performed following the standard protocol without IV contrast.  Comparison:  Renal ultrasound 07/10/2012.  CTs of the chest, abdomen and pelvis 04/02/2012.  CT CHEST  Findings:  Left subclavian Port-A-Cath appears unchanged.  There are no enlarged mediastinal, hilar or axillary lymph nodes.  Coronary artery calcifications are again noted.  There is no pleural or pericardial effusion.  The lobulated right upper lobe lesion has enlarged, measuring 2.3 x 1.6 cm (previously 1.9 x 1.5 cm).  There is increased cavitation along the superior medial margin of this lesion.  No other pulmonary nodules are identified.  There is no pleural or pericardial effusion.  Gynecomastia and a probable sebaceous cyst in the upper right back are unchanged.  IMPRESSION:  1.  Further enlargement of right upper lobe lobulated nodule, remaining concerning for bronchogenic carcinoma.  Tissue sampling recommended if not previously performed. 2.  No other nodules or adenopathy.  CT ABDOMEN AND PELVIS  Findings:  As evaluated in the noncontrast state, the liver, biliary system and pancreas appear stable status post cholecystectomy.  Splenomegaly has improved.  The probable infarct posteriorly in the spleen is unchanged.  There is no adrenal mass.  Mild renal cysts are grossly unchanged.  There are small calyceal calculi in both kidneys, larger on the left. Two large calculi are again demonstrated within the left renal pelvis as before.  The left renal pelvis is mildly dilated.  There is no perinephric soft tissue stranding or focal fluid collection.  The left ureter is normal in caliber.  The bladder appears stable.  There is no evidence of bladder calculus.  There are stable postsurgical changes status post ileostomy, subtotal colectomy and diverting colostomy.  Perirectal soft tissue stranding appears unchanged.  The colonic anastomosis appears stable.  There are no enlarged abdominal pelvic lymph nodes.  There is no ascites or peritoneal nodularity. Focus of probable fat necrosis in the left omentum on image 67 is stable.  There are no acute or worrisome osseous findings.  Chronic anterior wedging at T12 is stable.  IMPRESSION:  1.  No evidence of abdominal pelvic metastatic disease on noncontrast imaging. 2.  Stable postsurgical  changes and perirectal soft tissue stranding. 3.  Grossly stable appearance of the kidneys.  There are persistent large calculi within the left renal pelvis.  No secondary signs of UPJ obstruction are demonstrated. 4.  Improved splenomegaly.   Original Report Authenticated By: Carey Bullocks, M.D.    US Renal  07/10/2012  *RADIOLOGY REPORT*  Clinical Data: History of adenocarcinoma of the rectum, now with right-sided flank and back pain  RENAL/URINARY TRACT ULTRASOUND COMPLETE  Comparison:  CT abdomen pelvis - 04/02/2012  Findings:  Right Kidney:  Normal cortical thickness, echogenicity and size, measuring 12.1 cm in length. There is an approximately 3.5 x 3.0 x 3.5 cm anechoic cyst arising from the superior pole of the right kidney. There are several ill-defined punctate area of increased echogenicity within the mid (images 15 and 18) and superior pole of the right kidney (image 19) worrisome for echogenic renal stones as was demonstrated on recent abdominal CT.  There is mild dilatation of the right renal pelvis without significant right-sided pelvocaliectasis.  Left Kidney:  Normal cortical thickness, echogenicity and size, measuring 12.3 cm in length. There is an approximately 3.6 x 2.5 x 3.2 cm exophytic anechoic cyst arising from the inferior pole left kidney.  There is an additional approximately 2.0 x 1.6 x 2.1 cm anechoic lesion within the lower pole of the left kidney which may contain a thin internal septation compatible with a minimally complex renal cyst as was demonstrated on prior abdominal CT. There are a cluster of echogenic structures within the inferior pole of the left kidney (image 37) as well as a linear echogenic structure in the mid aspect of the left kidney (image 49) suggestive of renal stones as was demonstrated on prior abdominal CT.  No urinary obstruction.  Bladder:  Decompressed.  IMPRESSION: 1.  Interval dilatation of the right renal pelvis without significant right-sided  pelvocaliectasis.  The etiology of the finding is not depicted on the examination though note, several echogenic stones are seen within the right kidney.  This represents an interval change from prior abdominal CT performed 04/02/2012. Further evaluation with abdominal CT may be performed as clinically indicated. 2.  Suspected left-sided nonobstructing nephrolithiasis as was demonstrated on prior abdominal CT. 3.  Bilateral renal cysts as above.  This was made a call  report.   Original Report Authenticated By: Tacey Ruiz, MD    IMPRESSION: Mr. Withem is a very nice 62 year-old gentleman with a slowly enlarging right upper lung metastasis from metastatic rectal cancer. The 2.3 cm soft tissue nodule now shows some cavitation and no interval development of local, regional or other metastatic disease has emerged. In this setting, the patient may potentially benefit from localized stereotactic body radiotherapy in the treatment of oligo metastatic disease.  PLAN:Today, I talked to the patient and family about the findings and work-up thus far.  We discussed the natural history of disease and general treatment, highlighting the role or radiotherapy in the management.  We discussed the available radiation techniques, and focused on the details of logistics and delivery with stereotactic body radiotherapy.  We reviewed the high likelihood of local control associated with stereotactic body radiotherapy with little risk of toxicity(ref). We reviewed the anticipated acute and late sequelae associated with radiation in this setting.  The patient was encouraged to ask questions that I answered to the best of my ability.  I filled out a patient counseling form during our discussion including treatment diagrams.  We retained a copy for our records.  The patient would like to proceed with radiation and will be scheduled for CT simulation on 2/26 at 3 PM.  I spent 60 minutes minutes face to face with the patient and more  than 50% of that time was spent in counseling and/or coordination of care.     ------------------------------------------------  Artist Pais. Kathrynn Running, M.D.       Reference:  J Clin Oncol. 2009 Apr 1;27(10):1579-84. doi: 10.1200/JCO.2008.19.6386. Epub 2009 Mar 2. Multi-institutional phase I/II trial of stereotactic body radiation therapy for lung metastases.   Rusthoven KE(1), Kavanagh BD, Burri SH, Chen C, Cardenes H, Garner [place], Pugh TJ, Siri Cole LE, Schefter TE. Chartered loss adjuster information: (1)[employer], Department of Radiation Oncology and Medical Oncology, [place], [place] [address], [place]. Comment in J Clin Oncol. 2009 Apr 1;27(10):1537-9.   PURPOSE: To evaluate the efficacy and tolerability of high-dose stereotactic body radiation therapy (SBRT) for the treatment of patients with one to three lung metastases.   PATIENTS AND METHODS: Patients with one to three lung metastases with cumulative maximum tumor diameter smaller than 7 cm were enrolled and treated on a multi-institutional phase I/II clinical trial in which they received SBRT delivered in 3 fractions. In phase I, the total dose was safely escalated from 48 to 60 Gy. The phase II dose was 60 Gy. The primary end point was local control. Lesions with at least 6 months of radiographic follow-up were considered assessable for local control. Secondary end points included toxicity and survival.   RESULTS: Thirty-eight patients with 63 lesions were enrolled and treated at three participating institutions. Seventy-one percent had received at least one prior systemic regimen for metastatic disease and 34% had received at least two prior regimens (range, zero to five). Two patients had local recurrence after prior surgical resection. There was no grade 4 toxicity. The incidence of any grade 3 toxicity was 8% (three of 38). Symptomatic pneumonitis occurred in one patient (2.6%). Fifty lesions were assessable for local control. Median  follow-up for assessable lesions was 15.4 months (range, 6 to 48 months). The median gross tumor volume was 4.2 mL (range, 0.2 to 52.3 mL). Actuarial local control at one and two years after SBRT was 100% and 96%, respectively. Local progression occurred in one patient, 13 months after SBRT. Median survival was  19 months.   CONCLUSION: This multi-institutional phase I/II trial demonstrates that high-dose SBRT is safe and effective for the treatment of patients with one to three lung metastases.   PMID: [id number]

## 2012-07-23 NOTE — Addendum Note (Signed)
Encounter addended by: Senetra Dillin Mintz Tyrelle Raczka, RN on: 07/23/2012  7:27 PM<BR>     Documentation filed: Charges VN

## 2012-07-23 NOTE — Progress Notes (Signed)
Pt reports fatigue, weakness, loss of appetite, SOB w/exertion since Dec 2013. He c/o mild pain in his shoulder blades which he attributes to med onc injection he received 4 days ago. He denies other pain, cough.

## 2012-07-23 NOTE — Progress Notes (Signed)
Please see the Nurse Progress Note in the MD Initial Consult Encounter for this patient. 

## 2012-07-25 ENCOUNTER — Telehealth: Payer: Self-pay | Admitting: Radiation Oncology

## 2012-07-25 ENCOUNTER — Ambulatory Visit
Admit: 2012-07-25 | Discharge: 2012-07-25 | Disposition: A | Discharge status: Home / Self Care | Payer: Managed Care, Other (non HMO) | Attending: Radiation Oncology | Admitting: Radiation Oncology

## 2012-07-25 ENCOUNTER — Other Ambulatory Visit: Payer: Self-pay | Admitting: Radiation Oncology

## 2012-07-25 DIAGNOSIS — R059 Cough, unspecified: Secondary | ICD-10-CM | POA: Insufficient documentation

## 2012-07-25 DIAGNOSIS — J3489 Other specified disorders of nose and nasal sinuses: Secondary | ICD-10-CM | POA: Insufficient documentation

## 2012-07-25 DIAGNOSIS — R05 Cough: Secondary | ICD-10-CM | POA: Insufficient documentation

## 2012-07-25 DIAGNOSIS — C7800 Secondary malignant neoplasm of unspecified lung: Secondary | ICD-10-CM

## 2012-07-25 DIAGNOSIS — Z510 Encounter for antineoplastic radiation therapy: Secondary | ICD-10-CM | POA: Insufficient documentation

## 2012-07-25 DIAGNOSIS — R5381 Other malaise: Secondary | ICD-10-CM | POA: Insufficient documentation

## 2012-07-25 DIAGNOSIS — R0602 Shortness of breath: Secondary | ICD-10-CM | POA: Insufficient documentation

## 2012-07-25 NOTE — Telephone Encounter (Signed)
Met w patient to discuss RO billing. Pt had no financial concerns today.  Dx :  2.3 cm solitary lung metastasis from rectal cancer  Attending Rad: MM  Rad Tx:  SRS x5

## 2012-07-25 NOTE — Progress Notes (Signed)
  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  NARRATIVE:  The patient was brought to the CT Simulation planning suite.  Identity was confirmed.  All relevant records and images related to the planned course of therapy were reviewed.  The patient freely provided informed written consent to proceed with treatment after reviewing the details related to the planned course of therapy. The consent form was witnessed and verified by the simulation staff.  Then, the patient was set-up in a stable reproducible  supine position for radiation therapy.  A BodyFix immobilization pillow was fabricated for reproducible positioning.  Then I personally applied the abdominal compression paddle to limit respiratory excursion.  4D respiratoy motion management CT images were obtained.  Surface markings were placed.  The CT images were loaded into the planning software.  Then, using Cine, MIP, and standard views, the internal target volume (ITV) and planning target volumes (PTV) were delinieated, and avoidance structures were contoured.  Treatment planning then occurred.  The radiation prescription was entered and confirmed.  A total of two complex treatment devices were fabricated in the form of the BodyFix immobilization pillow and a neck accuform cushion.  I have requested : 3D Simulation  I have requested a DVH of the following structures: Heart, Lungs, Esophagus, Chest Wall, Brachial Plexus, Major Blood Vessels, and targets.  PLAN:  The patient will receive 54 Gy in 3 fractions.  ________________________________  Artist Pais Kathrynn Running, M.D.

## 2012-07-30 NOTE — Progress Notes (Signed)
[name] Ribas, MD [address] [place] [address]  Adenocarcinoma of rectum - Plan: CBC with Differential  Pancytopenia, acquired  ANEMIA, CHRONIC  B12 deficiency  2.3 cm solitary lung metastasis from rectal cancer  CURRENT THERAPY: S/P 24 treatments of FOLFOX + Avastin. Oxaliplatin is reduced by 50% and 5-FU continuous infusion is reduced by 75%.  Beginning on 04/11/2011.   INTERVAL HISTORY: [name] 62 y.o. 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   We have been following a pulmonary nodule in [name]'s right upper lobe.  Most recently on CT scans for restaging, it has shown interval enlargement.  In December, I referred [name] to Dr. Thersa [name] (Radiation Oncologist) for consultation.  They planned for radiation to take place in [place], pending his Feb 2014 restaging scans.  Scan performed in Feb showed interval enlargement of this lesion.  I therefore assisted the patient in getting a consultation with Dr. Kathrynn [name] in [place] (Rad Onc).  This lesion is growing and although it is not biopsy proven to be malignant presently, Rad Onc has agreed to administer  stereotactic radiotherapy.  [name] underwent simulation on 07/25/12 and is slated for radiation to start on 08/02/2012.    [name] was noted to have epistaxis this AM when he arrived to the clinic.  CBC was performed in preparation for his scheduled chemotherapy today.  Platelet count is noted to be 27,000 and he therefore does not meet parameters for treatment today.  He is having gross hematuria and he admits to passing 2 renal calculi over the weekend, but today is the first day he noted gross hematuria.  He admits to having epistaxis over the past few days.  As a result, we will give him platelet today for active bleeding.  He is agreeable to receiving platelets for his bleeding.   He reports that  radiation will start next Monday and his scheduled consists of radiation on Monday-Wednesday-Friday-Monday-Wednesday.  As a result, we will hold therapy until the following week after radiation therapy is complete.  I will defer chemotherapy x 21 days.  He is agreeable to this plan.   [name] is tolerating chemotherapy well with interruptions in therapy due to treatment parameters not being met.  His counts tend to recover rather quickly with a deferment in therapy.   He denies any complaints and he denies any reservation regarding radiation therapy.   Oncologically, he denies any complaints and ROS questioning is negative.   Past Medical History  Diagnosis Date  . Cancer     Colon ca dx 2008 surg/rad/chemo  . Pancytopenia, acquired 12/04/2010  . B12 deficiency 12/07/2010  . Lung cancer   . Hypertension   . Diabetes mellitus   . Hypercholesteremia   . Kidney stones   . CAD (coronary artery disease)   . Anemia   . 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     is allergic to daypro.  [name] does not currently have medications on file.  Past Surgical History  Procedure Laterality Date  . Ileostomy  12/07/2010    Procedure: ILEOSTOMY;  Surgeon: [name];  Location: [facility];  Service: General;  Laterality: N/A;  Diverting Ileostomy Lysis of adhesions Exploratory Laparotomy  . Lung biopsy  10/27/10    rt lobe- adenocarcinoma  . Cholecystectomy    . Cataract extraction w/ intraocular lens implant  2007    bil  . Lithotripsy  2002  . Coronary angioplasty with stent placement  2003    stenting x 2  . Colon resection  2008    Denies any headaches, dizziness, double vision,  fevers, chills, night sweats, nausea, vomiting, diarrhea, constipation, chest pain, heart palpitations, shortness of breath, blood in stool, black tarry stool, urinary pain, urinary burning, urinary frequency, hematuria.   PHYSICAL EXAMINATION  ECOG PERFORMANCE STATUS: 1 - Symptomatic but completely ambulatory  There were no vitals filed for this visit.  GENERAL:alert, no distress, well nourished, well developed, comfortable, cooperative, obese and smiling  SKIN: skin color, texture, turgor are normal, no rashes or significant lesions  HEAD: Normocephalic, No masses, lesions, tenderness or abnormalities  EYES: normal, Conjunctiva are pink and non-injected  EARS: External ears normal  OROPHARYNX:mucous membranes are moist  NECK: supple, trachea midline, erythematous rash on anterior neck. It is not pruritic or painful.  LYMPH: no palpable lymphadenopathy  BREAST:not examined  LUNGS: clear to auscultation  HEART: regular rate & rhythm, no murmurs, no gallops, S1 normal and S2 normal  ABDOMEN:abdomen soft, non-tender and normal bowel sounds  BACK: Back symmetric, no curvature.  EXTREMITIES:less then 2 second capillary refill, no joint deformities, effusion, or inflammation, no edema, no skin discoloration, no clubbing, no cyanosis  NEURO: alert & oriented x 3 with fluent speech, no focal motor/sensory deficits, gait normal     LABORATORY DATA: CBC    Component Value Date/Time   WBC 3.7* 07/31/2012 0851   RBC 3.99* 07/31/2012 0851   HGB 12.2* 07/31/2012 0851   HCT 36.5* 07/31/2012 0851   PLT 27* 07/31/2012 0851   MCV 91.5 07/31/2012 0851   MCH 30.6 07/31/2012 0851   MCHC 33.4 07/31/2012 0851   RDW 22.7* 07/31/2012 0851   LYMPHSABS 0.6* 07/31/2012 0851   MONOABS 0.5 07/31/2012 0851   EOSABS 0.0 07/31/2012 0851   BASOSABS 0.0 07/31/2012 0851      Chemistry      Component Value Date/Time   NA 136 07/31/2012 0851   K 3.5 07/31/2012 0851   CL 103 07/31/2012 0851   CO2 17* 07/31/2012 0851   BUN 11 07/31/2012  0851   CREATININE 1.37* 07/31/2012 0851      Component Value Date/Time   CALCIUM 8.9 07/31/2012 0851   ALKPHOS 132* 07/31/2012 0851   AST 30 07/31/2012 0851   ALT 33 07/31/2012 0851   BILITOT 4.1* 07/31/2012 0851      RADIOGRAPHIC STUDIES:  07/16/2012  *RADIOLOGY REPORT*  Clinical Data: Restaging metastatic colorectal cancer.  Chemotherapy ongoing.  CT CHEST, ABDOMEN AND PELVIS WITHOUT CONTRAST  Technique: Multidetector CT imaging of the chest, abdomen and  pelvis was performed following the standard protocol without IV  contrast.  Comparison: Renal ultrasound 07/10/2012. CTs of the chest,  abdomen and pelvis 04/02/2012.  CT CHEST  Findings: Left subclavian Port-A-Cath appears unchanged. There  are no enlarged mediastinal, hilar or axillary lymph nodes.  Coronary artery calcifications are again noted.  There is no pleural or pericardial effusion. The lobulated right  upper lobe lesion has enlarged, measuring 2.3 x 1.6 cm (previously  1.9 x 1.5 cm). There is increased cavitation along the superior  medial margin of this lesion. No other pulmonary  nodules are  identified. There is no pleural or pericardial effusion.  Gynecomastia and a probable sebaceous cyst in the upper right back  are unchanged.  IMPRESSION:  1. Further enlargement of right upper lobe lobulated nodule,  remaining concerning for bronchogenic carcinoma. Tissue sampling  recommended if not previously performed.  2. No other nodules or adenopathy.  CT ABDOMEN AND PELVIS  Findings: As evaluated in the noncontrast state, the liver,  biliary system and pancreas appear stable status post  cholecystectomy. Splenomegaly has improved. The probable infarct  posteriorly in the spleen is unchanged. There is no adrenal mass.  Mild renal cysts are grossly unchanged. There are small calyceal  calculi in both kidneys, larger on the left. Two large calculi are  again demonstrated within the left renal pelvis as before. The  left  renal pelvis is mildly dilated. There is no perinephric soft  tissue stranding or focal fluid collection. The left ureter is  normal in caliber. The bladder appears stable. There is no  evidence of bladder calculus.  There are stable postsurgical changes status post ileostomy,  subtotal colectomy and diverting colostomy. Perirectal soft tissue  stranding appears unchanged. The colonic anastomosis appears  stable. There are no enlarged abdominal pelvic lymph nodes. There  is no ascites or peritoneal nodularity. Focus of probable fat  necrosis in the left omentum on image 67 is stable.  There are no acute or worrisome osseous findings. Chronic anterior  wedging at T12 is stable.  IMPRESSION:  1. No evidence of abdominal pelvic metastatic disease on  noncontrast imaging.  2. Stable postsurgical changes and perirectal soft tissue  stranding.  3. Grossly stable appearance of the kidneys. There are persistent  large calculi within the left renal pelvis. No secondary signs of  UPJ obstruction are demonstrated.  4. Improved splenomegaly.  Original Report Authenticated By: Carey Bullocks, M.D.    ASSESSMENT:  1. 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 S/P 24 treatments of FOLFOX + Avastin chemotherapy. Oxaliplatin is reduced by 50% and 5-FU continuous infusion is reduced by 75%.  Beginning on 04/11/2011.  2. Thrombocytopenia, secondary to chemotherapy administration. Will follow closely. Will hold chemotherapy for a platelet count < 45,000. 3. Enlarging lung mass in the right upper lobe.  Will undergo stereotactic radiotherapy by Dr. Kathrynn [name] beginning next week.  4. B12 Deficiency  5. Deconditioning 6. Tinea infection of skin, anterior neck, asymptomatic.  Patient will let us know if this rash become pruritic or uncomfortable and will treat appropriately.    PLAN:  1. I personally reviewed and went over laboratory results with the patient. 2. I personally reviewed and went  over radiographic studies with the patient. 3. Continue with radiation as scheduled 4. Will hold chemotherapy today due to platelet count of 27,000. 5. Will defer chemotherapy x 21 days.  This will be reflected in treatment plan and antibody plan.  6. Will transfuse platelets for epistaxis and gross hematuria with platelet count 27,000. 7. Return in 8 weeks for follow-up.   All questions were answered. The patient knows to call the clinic with any problems, questions or concerns. We can certainly see the patient much sooner if necessary.  The patient and plan discussed with Glenford Peers, MD and he is in agreement with the aforementioned.  KEFALAS,THOMAS   Addendum:  UA noted to be positive for both nitrite and leukocytes.  Patient admits to a malodorous urine.  Will escribe Bactrim DS.  KEFALAS,THOMAS

## 2012-07-31 ENCOUNTER — Encounter (HOSPITAL_BASED_OUTPATIENT_CLINIC_OR_DEPARTMENT_OTHER): Payer: Managed Care, Other (non HMO) | Admitting: Oncology

## 2012-07-31 ENCOUNTER — Encounter (HOSPITAL_COMMUNITY): Payer: Self-pay | Admitting: Oncology

## 2012-07-31 ENCOUNTER — Encounter (HOSPITAL_COMMUNITY): Payer: Managed Care, Other (non HMO) | Attending: Oncology

## 2012-07-31 ENCOUNTER — Encounter (HOSPITAL_COMMUNITY): Payer: Self-pay

## 2012-07-31 VITALS — BP 119/70 | HR 75 | Temp 98.2°F | Resp 18 | Wt 213.0 lb

## 2012-07-31 DIAGNOSIS — D649 Anemia, unspecified: Secondary | ICD-10-CM

## 2012-07-31 DIAGNOSIS — D61818 Other pancytopenia: Secondary | ICD-10-CM

## 2012-07-31 DIAGNOSIS — C20 Malignant neoplasm of rectum: Secondary | ICD-10-CM

## 2012-07-31 DIAGNOSIS — E538 Deficiency of other specified B group vitamins: Secondary | ICD-10-CM

## 2012-07-31 DIAGNOSIS — N390 Urinary tract infection, site not specified: Secondary | ICD-10-CM

## 2012-07-31 DIAGNOSIS — C787 Secondary malignant neoplasm of liver and intrahepatic bile duct: Secondary | ICD-10-CM

## 2012-07-31 DIAGNOSIS — C7800 Secondary malignant neoplasm of unspecified lung: Secondary | ICD-10-CM

## 2012-07-31 DIAGNOSIS — R222 Localized swelling, mass and lump, trunk: Secondary | ICD-10-CM

## 2012-07-31 DIAGNOSIS — D539 Nutritional anemia, unspecified: Secondary | ICD-10-CM

## 2012-07-31 LAB — CBC WITH DIFFERENTIAL/PLATELET
Basophils Absolute: 0 10*3/uL (ref 0.0–0.1)
HCT: 36.5 % — ABNORMAL LOW (ref 39.0–52.0)
Hemoglobin: 12.2 g/dL — ABNORMAL LOW (ref 13.0–17.0)
Lymphocytes Relative: 16 % (ref 12–46)
Monocytes Absolute: 0.5 10*3/uL (ref 0.1–1.0)
Neutro Abs: 2.6 10*3/uL (ref 1.7–7.7)
RDW: 22.7 % — ABNORMAL HIGH (ref 11.5–15.5)
WBC: 3.7 10*3/uL — ABNORMAL LOW (ref 4.0–10.5)

## 2012-07-31 LAB — URINALYSIS, DIPSTICK ONLY
Nitrite: POSITIVE — AB
Protein, ur: >300 mg/dL — AB
Urobilinogen, UA: 2 mg/dL — ABNORMAL HIGH (ref 0.0–1.0)

## 2012-07-31 LAB — COMPREHENSIVE METABOLIC PANEL
ALT: 33 U/L (ref 0–53)
AST: 30 U/L (ref 0–37)
CO2: 17 mEq/L — ABNORMAL LOW (ref 19–32)
Chloride: 103 mEq/L (ref 96–112)
Creatinine, Ser: 1.37 mg/dL — ABNORMAL HIGH (ref 0.50–1.35)
GFR calc non Af Amer: 55 mL/min — ABNORMAL LOW (ref >90)
Sodium: 136 mEq/L (ref 135–145)
Total Bilirubin: 4.1 mg/dL — ABNORMAL HIGH (ref 0.3–1.2)

## 2012-07-31 MED ORDER — SULFAMETHOXAZOLE-TRIMETHOPRIM 800-160 MG PO TABS: 1.0000 | ORAL_TABLET | Freq: Two times a day (BID) | ORAL | Status: AC

## 2012-07-31 MED ORDER — HEPARIN SOD (PORK) LOCK FLUSH 100 UNIT/ML IV SOLN
500.0000 [IU] | Freq: Once | INTRAVENOUS | Status: AC
  Administered 2012-07-31: 500 [IU] via INTRAVENOUS
  Filled 2012-07-31: qty 5

## 2012-07-31 MED ORDER — SODIUM CHLORIDE 0.9 % IV SOLN
250.0000 mL | Freq: Once | INTRAVENOUS | Status: AC
  Administered 2012-07-31: 250 mL via INTRAVENOUS

## 2012-07-31 MED ORDER — HEPARIN SOD (PORK) LOCK FLUSH 100 UNIT/ML IV SOLN
INTRAVENOUS | Status: AC
  Filled 2012-07-31: qty 5

## 2012-07-31 MED ORDER — SODIUM CHLORIDE 0.9 % IV SOLN: INTRAVENOUS | Status: DC

## 2012-07-31 NOTE — Patient Instructions (Addendum)
Will hold chemotherapy deferred today due to low platelets (27,000) and will be held until after the completion of radiation.  Follow-up with Rad Onc as directed.  Radiation as scheduled.  Will perform CBC with Diff weekly while undergoing radiation. Will transfuse 1 unit of pheresed platelets for bloody nose and gross hematuria in the setting of thrombocytopenia. Bactrim DS was e-scribed to [facility],  (1 tablet twice daily for 7 days) for positive nitrates and leukocytes in urinalysis today. Return for chemotherapy ~ March 25 with pre-chemotherapy labs.  Return to the clinic for follow-up in 8 weeks

## 2012-07-31 NOTE — Progress Notes (Signed)
CRITICAL VALUE ALERT Critical value received:  Platelets 27K Date of notification:  07/31/2012  Time of notification: 9:42 AM  Critical value read back:  yes Nurse who received alert:  Payton Doughty, RN MD notified (1st page):  T. Jacalyn Lefevre, PA-C   07/31/2012 9:42 AM Per T. Jacalyn Lefevre, PA-C, [name]'s chemo needs to be held x 1 week - T. Jacalyn Lefevre, PA-C is seeing [name] in the office today and will discuss w/ same.  Pt's treatment RN, French Ana, notified as well.

## 2012-07-31 NOTE — Patient Instructions (Addendum)
[facility] Discharge Instructions  RECOMMENDATIONS MADE BY THE CONSULTANT AND ANY TEST RESULTS WILL BE SENT TO YOUR REFERRING PHYSICIAN.  HOLD chemotherapy today. Platelets given today. We will HOLD chemotherapy until after Radiation and then we will resume treatments. Lab work weekly here while on Radiation. Bactrim DS sent to your pharmacy to treat bladder infection, take all medication as directed. 8 weeks to see MD.  Thank you for choosing [facility] to provide your oncology and hematology care.  To afford each patient quality time with our providers, please arrive at least 15 minutes before your scheduled appointment time.  With your help, our goal is to use those 15 minutes to complete the necessary work-up to ensure our physicians have the information they need to help with your evaluation and healthcare recommendations.    Effective January 1st, 2014, we ask that you re-schedule your appointment with our physicians should you arrive 10 or more minutes late for your appointment.  We strive to give you quality time with our providers, and arriving late affects you and other patients whose appointments are after yours.    Again, thank you for choosing [facility].  Our hope is that these requests will decrease the amount of time that you wait before being seen by our physicians.       _____________________________________________________________  Should you have questions after your visit to [facility], please contact our office at [phone] between the hours of 8:30 a.m. and 5:00 p.m.  Voicemails left after 4:30 p.m. will not be returned until the following business day.  For prescription refill requests, have your pharmacy contact our office with your prescription refill request.

## 2012-08-01 ENCOUNTER — Encounter: Payer: Self-pay | Admitting: Radiation Oncology

## 2012-08-01 ENCOUNTER — Other Ambulatory Visit: Payer: Self-pay | Admitting: Radiation Oncology

## 2012-08-02 ENCOUNTER — Encounter (HOSPITAL_COMMUNITY): Payer: Managed Care, Other (non HMO)

## 2012-08-06 ENCOUNTER — Ambulatory Visit
Admit: 2012-08-06 | Discharge: 2012-08-06 | Disposition: A | Discharge status: Home / Self Care | Payer: Managed Care, Other (non HMO) | Attending: Radiation Oncology | Admitting: Radiation Oncology

## 2012-08-06 DIAGNOSIS — C7800 Secondary malignant neoplasm of unspecified lung: Secondary | ICD-10-CM

## 2012-08-06 NOTE — Progress Notes (Signed)
  Radiation Oncology         [phone]) [phone] ________________________________  Name: [name] MRN: [id number]  Date: 08/06/2012  DOB: 1950/06/16  Stereotactic Body Radiotherapy Treatment Procedure Note  NARRATIVE:  [name] was brought to the stereotactic radiation treatment machine and placed supine on the CT couch. The patient was set up for stereotactic body radiotherapy on the body fix pillow.  3D TREATMENT PLANNING AND DOSIMETRY:  The patient's radiation plan was reviewed and approved by Dr. Roselind Messier prior to starting treatment.  It showed 3-dimensional radiation distributions overlaid onto the planning CT.  The [facility] for the target structures as well as the organs at risk were reviewed. The documentation of this is filed in the radiation oncology EMR.  SIMULATION VERIFICATION:  The patient underwent CT imaging on the treatment unit.  These were carefully aligned to document that the ablative radiation dose would cover the target volume and maximally spare the nearby organs at risk according to the planned distribution.  SPECIAL TREATMENT PROCEDURE: [name] received high dose ablative stereotactic body radiotherapy to the planned target volume without unforeseen complications. Treatment was delivered uneventfully. The high doses associated with stereotactic body radiotherapy and the significant potential risks require careful treatment set up and patient monitoring constituting a special treatment procedure   STEREOTACTIC TREATMENT MANAGEMENT:  Following delivery, the patient was evaluated clinically. The patient tolerated treatment without significant acute effects, and was discharged to home in stable condition.    PLAN: Continue treatment as planned.  ________________________________  Artist Pais. Kathrynn Running, M.D.

## 2012-08-07 ENCOUNTER — Encounter (HOSPITAL_BASED_OUTPATIENT_CLINIC_OR_DEPARTMENT_OTHER): Payer: Managed Care, Other (non HMO)

## 2012-08-07 DIAGNOSIS — C20 Malignant neoplasm of rectum: Secondary | ICD-10-CM

## 2012-08-07 LAB — CBC WITH DIFFERENTIAL/PLATELET
Eosinophils Absolute: 0.1 10*3/uL (ref 0.0–0.7)
Eosinophils Relative: 1 % (ref 0–5)
Hemoglobin: 12.6 g/dL — ABNORMAL LOW (ref 13.0–17.0)
Lymphs Abs: 0.5 10*3/uL — ABNORMAL LOW (ref 0.7–4.0)
MCH: 31.1 pg (ref 26.0–34.0)
MCV: 90.6 fL (ref 78.0–100.0)
Monocytes Relative: 9 % (ref 3–12)
RBC: 4.05 MIL/uL — ABNORMAL LOW (ref 4.22–5.81)

## 2012-08-07 NOTE — Progress Notes (Signed)
Labs drawn today cbc/diff

## 2012-08-08 ENCOUNTER — Ambulatory Visit: Payer: Managed Care, Other (non HMO) | Admitting: Radiation Oncology

## 2012-08-09 ENCOUNTER — Ambulatory Visit
Admit: 2012-08-09 | Discharge: 2012-08-09 | Disposition: A | Discharge status: Home / Self Care | Payer: Managed Care, Other (non HMO) | Attending: Radiation Oncology | Admitting: Radiation Oncology

## 2012-08-09 ENCOUNTER — Telehealth (HOSPITAL_COMMUNITY): Payer: Self-pay | Admitting: Oncology

## 2012-08-09 DIAGNOSIS — C7800 Secondary malignant neoplasm of unspecified lung: Secondary | ICD-10-CM

## 2012-08-09 NOTE — Telephone Encounter (Signed)
[facility] BILLING [phone] SPK TO LAURIN ?'D WHY BAL OF $8,469.62 WAS NOT SHOWING WHEN OUR CASHIER (TAMMY) PULLED UP THE PT'S ACCT? I INFORMED HER THAT PT HAD A COPAY CARD AND I WAS TRYN TO PAY $9528.41 USING THE CARD. PER LAURIN SHE DID  NOT SEE THE BAL ALL SHE COULD SEE WAS 2013 CHARGES. I ADVISED HER THAT I COULD SEE THE CHARGES AND I HAD AN  EOB FROM [employer] STATING HOW MUCH THEY PAID AND THE PT RESP. SHE WAS NOT HELPFUL AT ALL!   PC TO [name] KORAL AT [facility] AND ADVISED HIM OF MY ISSUE. HE WAS ABLE TO SEE THE CHARGE AND TOOK THE PYMNT OVER THE PHONE. HE STATED HE WOULD PROCESS THE PYMNT TODAY AND SEND THE PT A RECEIPT IN THE MAIL.

## 2012-08-09 NOTE — Progress Notes (Signed)
  Radiation Oncology         [phone]) [phone] ________________________________  Name: [name] MRN: [id number]  Date: 08/09/2012  DOB: 22-Mar-1951  Stereotactic Body Radiotherapy Treatment Procedure Note  NARRATIVE:  [name] was brought to the stereotactic radiation treatment machine and placed supine on the CT couch. The patient was set up for stereotactic body radiotherapy on the body fix pillow.  3D TREATMENT PLANNING AND DOSIMETRY:  The patient's radiation plan was reviewed and approved prior to starting treatment.  It showed 3-dimensional radiation distributions overlaid onto the planning CT.  The [facility] for the target structures as well as the organs at risk were reviewed. The documentation of this is filed in the radiation oncology EMR.  SIMULATION VERIFICATION:  The patient underwent CT imaging on the treatment unit.  These were carefully aligned to document that the ablative radiation dose would cover the target volume and maximally spare the nearby organs at risk according to the planned distribution.  SPECIAL TREATMENT PROCEDURE: [name] received high dose ablative stereotactic body radiotherapy to the planned target volume without unforeseen complications. Treatment was delivered uneventfully. The high doses associated with stereotactic body radiotherapy and the significant potential risks require careful treatment set up and patient monitoring constituting a special treatment procedure   STEREOTACTIC TREATMENT MANAGEMENT:  Following delivery, the patient was evaluated clinically. The patient tolerated treatment without significant acute effects, and was discharged to home in stable condition.    PLAN: Continue treatment as planned.  ________________________________  Artist Pais. Kathrynn Running, M.D.

## 2012-08-10 ENCOUNTER — Ambulatory Visit: Payer: Managed Care, Other (non HMO) | Admitting: Radiation Oncology

## 2012-08-13 ENCOUNTER — Encounter: Payer: Managed Care, Other (non HMO) | Admitting: Radiation Oncology

## 2012-08-13 ENCOUNTER — Ambulatory Visit: Admit: 2012-08-13 | Payer: Managed Care, Other (non HMO) | Admitting: Radiation Oncology

## 2012-08-13 ENCOUNTER — Encounter: Payer: Self-pay | Admitting: Radiation Oncology

## 2012-08-13 ENCOUNTER — Ambulatory Visit
Admit: 2012-08-13 | Discharge: 2012-08-13 | Disposition: A | Discharge status: Home / Self Care | Payer: Managed Care, Other (non HMO) | Attending: Radiation Oncology | Admitting: Radiation Oncology

## 2012-08-13 VITALS — BP 99/70 | HR 98 | Temp 98.0°F | Resp 20 | Wt 211.7 lb

## 2012-08-13 DIAGNOSIS — C7800 Secondary malignant neoplasm of unspecified lung: Secondary | ICD-10-CM

## 2012-08-13 NOTE — Progress Notes (Signed)
   Weekly Management Note:  outpatient Current Dose:  54 Gy  Projected Dose: 54 Gy   Narrative:  The patient presents for routine under treatment assessment.  CBCT/MVCT images/Port film x-rays were reviewed.  The chart was checked. Doing well.  No SOB, cough is congested but has cold-like sx as well. Improved with OTC cough meds.  Physical Findings:  weight is 211 lb 11.2 oz (96.026 kg). His oral temperature is 98 F (36.7 C). His blood pressure is 99/70 and his pulse is 98. His respiration is 20 and oxygen saturation is 100%.  Lungs clear b/l to auscultation  Impression:  The patient tolerated radiotherapy.  Plan: f/u card given for 1 month ________________________________   Lonie Peak, M.D.

## 2012-08-13 NOTE — Progress Notes (Signed)
  Radiation Oncology         [phone]) [phone] ________________________________  Name: [name] MRN: [id number]  Date: 08/13/2012  DOB: [birth year]  Stereotactic Body Radiotherapy Treatment Procedure Note  NARRATIVE:  [name] was brought to the stereotactic radiation treatment machine and placed supine on the CT couch. The patient was set up for stereotactic body radiotherapy on the body fix pillow.  3D TREATMENT PLANNING AND DOSIMETRY:  The patient's radiation plan was reviewed and approved prior to starting treatment.  It showed 3-dimensional radiation distributions overlaid onto the planning CT.  The [facility] for the target structures as well as the organs at risk were reviewed. The documentation of this is filed in the radiation oncology EMR.  SIMULATION VERIFICATION:  The patient underwent CT imaging on the treatment unit.  These were carefully aligned to document that the ablative radiation dose would cover the target volume and maximally spare the nearby organs at risk according to the planned distribution.  SPECIAL TREATMENT PROCEDURE: [name] received high dose ablative stereotactic body radiotherapy to the planned target volume without unforeseen complications. Treatment was delivered uneventfully. The high doses associated with stereotactic body radiotherapy and the significant potential risks require careful treatment set up and patient monitoring constituting a special treatment procedure   STEREOTACTIC TREATMENT MANAGEMENT:  Following delivery, the patient was evaluated clinically. The patient tolerated treatment without significant acute effects, and was discharged to home in stable condition.    PLAN: Continue treatment as planned.  ________________________________  Billie Lade, PhD, MD

## 2012-08-13 NOTE — Progress Notes (Signed)
Patient here completed 3/3 right lung, loose congested cough, alert,oriented x3, ha a cold from the weekend, slight fatigue, sob with activity, no c/o pain, off chemo till 1st next month, eating and drinking well stated 11:57 AM

## 2012-08-14 ENCOUNTER — Other Ambulatory Visit (HOSPITAL_COMMUNITY): Payer: Managed Care, Other (non HMO)

## 2012-08-15 ENCOUNTER — Ambulatory Visit: Payer: Managed Care, Other (non HMO) | Admitting: Radiation Oncology

## 2012-08-15 ENCOUNTER — Encounter (HOSPITAL_BASED_OUTPATIENT_CLINIC_OR_DEPARTMENT_OTHER): Payer: Managed Care, Other (non HMO)

## 2012-08-15 DIAGNOSIS — C20 Malignant neoplasm of rectum: Secondary | ICD-10-CM

## 2012-08-15 LAB — COMPREHENSIVE METABOLIC PANEL
AST: 37 U/L (ref 0–37)
BUN: 18 mg/dL (ref 6–23)
CO2: 17 mEq/L — ABNORMAL LOW (ref 19–32)
Calcium: 9.1 mg/dL (ref 8.4–10.5)
Creatinine, Ser: 1.6 mg/dL — ABNORMAL HIGH (ref 0.50–1.35)
GFR calc Af Amer: 53 mL/min — ABNORMAL LOW (ref >90)
GFR calc non Af Amer: 46 mL/min — ABNORMAL LOW (ref >90)
Glucose, Bld: 178 mg/dL — ABNORMAL HIGH (ref 70–99)
Total Bilirubin: 3.5 mg/dL — ABNORMAL HIGH (ref 0.3–1.2)

## 2012-08-15 LAB — CEA: CEA: 5 ng/mL (ref 0.0–5.0)

## 2012-08-15 LAB — CBC WITH DIFFERENTIAL/PLATELET
Eosinophils Absolute: 0 10*3/uL (ref 0.0–0.7)
Eosinophils Relative: 1 % (ref 0–5)
HCT: 35.3 % — ABNORMAL LOW (ref 39.0–52.0)
Lymphs Abs: 0.5 10*3/uL — ABNORMAL LOW (ref 0.7–4.0)
MCH: 31.3 pg (ref 26.0–34.0)
MCV: 93.6 fL (ref 78.0–100.0)
Monocytes Absolute: 0.5 10*3/uL (ref 0.1–1.0)
Platelets: 55 10*3/uL — ABNORMAL LOW (ref 150–400)
RBC: 3.77 MIL/uL — ABNORMAL LOW (ref 4.22–5.81)
RDW: 22.3 % — ABNORMAL HIGH (ref 11.5–15.5)

## 2012-08-15 NOTE — Progress Notes (Signed)
Labs drawn today for cbc,cea,cmp 

## 2012-08-16 NOTE — Progress Notes (Signed)
  Radiation Oncology         [phone]) [phone] ________________________________  Name: [name] MRN: [id number]  Date: 08/01/2012  DOB: 1950-09-21  RESPIRATORY MOTION MANAGEMENT SIMULATION  NARRATIVE:  In order to account for effect of respiratory motion on target structures and other organs in the planning and delivery of radiotherapy, this patient underwent respiratory motion management simulation.  To accomplish this, when the patient was brought to the CT simulation planning suite, 4D respiratoy motion management CT images were obtained.  The CT images were loaded into the planning software.  Then, using a variety of tools including Cine, MIP, and standard views, the target volume and planning target volumes (PTV) were delineated.  Avoidance structures were contoured.  Treatment planning then occurred.  Dose volume histograms were generated and reviewed for each of the requested structure.  The resulting plan was carefully reviewed and approved today  ------------------------------------------------  Artist Pais. Kathrynn Running, M.D.  .

## 2012-08-16 NOTE — Progress Notes (Signed)
  Radiation Oncology         [phone]) [phone] ________________________________  Name: [name] MRN: [id number]  Date: 08/13/2012  DOB: 01/05/1951  End of Treatment Note  Diagnosis:   62 year old gentleman with a 2.3 cm solitary lung metastasis from rectal cancer-stage IVa.   Indication for treatment:  Curative, Ablation of Oligometastasis       Radiation treatment dates:  08/06/2012, 08/09/2012, 08/13/2012  Site/dose: The 2.3 cm solitary right upper lung nodule received 54 Gy in 3 fractions  Beams/energy:   3D conformal radiation was delivered with VMAT techniques in a body Fix cast with [facility] beam CT real time image guidance.  Narrative: The patient tolerated radiation treatment relatively well.   He had no SOB, He did have cough which was congested but had cold-like sx as well. Improved with OTC cough meds.  Plan: The patient has completed radiation treatment. The patient will return to radiation oncology clinic for routine followup in one month. I advised them to call or return sooner if they have any questions or concerns related to their recovery or treatment. ________________________________  Artist Pais. Kathrynn Running, M.D.

## 2012-08-21 ENCOUNTER — Encounter (HOSPITAL_BASED_OUTPATIENT_CLINIC_OR_DEPARTMENT_OTHER): Payer: Managed Care, Other (non HMO)

## 2012-08-21 ENCOUNTER — Inpatient Hospital Stay (HOSPITAL_COMMUNITY): Payer: Managed Care, Other (non HMO)

## 2012-08-21 DIAGNOSIS — C20 Malignant neoplasm of rectum: Secondary | ICD-10-CM

## 2012-08-21 LAB — CBC WITH DIFFERENTIAL/PLATELET
Hemoglobin: 11.5 g/dL — ABNORMAL LOW (ref 13.0–17.0)
Lymphocytes Relative: 8 % — ABNORMAL LOW (ref 12–46)
Lymphs Abs: 0.5 10*3/uL — ABNORMAL LOW (ref 0.7–4.0)
Monocytes Relative: 14 % — ABNORMAL HIGH (ref 3–12)
Neutro Abs: 5.1 10*3/uL (ref 1.7–7.7)
Neutrophils Relative %: 75 % (ref 43–77)
Platelets: 40 10*3/uL — ABNORMAL LOW (ref 150–400)
RBC: 3.63 MIL/uL — ABNORMAL LOW (ref 4.22–5.81)
WBC: 6.8 10*3/uL (ref 4.0–10.5)

## 2012-08-21 LAB — COMPREHENSIVE METABOLIC PANEL
ALT: 18 U/L (ref 0–53)
Alkaline Phosphatase: 109 U/L (ref 39–117)
BUN: 19 mg/dL (ref 6–23)
Chloride: 108 mEq/L (ref 96–112)
GFR calc Af Amer: 59 mL/min — ABNORMAL LOW (ref >90)
Glucose, Bld: 107 mg/dL — ABNORMAL HIGH (ref 70–99)
Potassium: 3.9 mEq/L (ref 3.5–5.1)
Sodium: 137 mEq/L (ref 135–145)
Total Bilirubin: 2.5 mg/dL — ABNORMAL HIGH (ref 0.3–1.2)
Total Protein: 5.9 g/dL — ABNORMAL LOW (ref 6.0–8.3)

## 2012-08-21 NOTE — Progress Notes (Signed)
Labs drawn today for cbc/diff,cmp 

## 2012-08-23 ENCOUNTER — Ambulatory Visit (HOSPITAL_COMMUNITY): Payer: Managed Care, Other (non HMO)

## 2012-08-28 ENCOUNTER — Other Ambulatory Visit (HOSPITAL_COMMUNITY): Payer: Managed Care, Other (non HMO)

## 2012-08-31 ENCOUNTER — Encounter (HOSPITAL_COMMUNITY): Payer: Managed Care, Other (non HMO) | Attending: Oncology

## 2012-08-31 ENCOUNTER — Ambulatory Visit (HOSPITAL_COMMUNITY): Payer: Managed Care, Other (non HMO) | Admitting: Oncology

## 2012-08-31 ENCOUNTER — Encounter (HOSPITAL_BASED_OUTPATIENT_CLINIC_OR_DEPARTMENT_OTHER): Payer: Managed Care, Other (non HMO) | Admitting: Oncology

## 2012-08-31 VITALS — BP 104/71 | HR 75 | Temp 97.3°F | Wt 216.7 lb

## 2012-08-31 DIAGNOSIS — C20 Malignant neoplasm of rectum: Secondary | ICD-10-CM

## 2012-08-31 DIAGNOSIS — E538 Deficiency of other specified B group vitamins: Secondary | ICD-10-CM

## 2012-08-31 LAB — CBC WITH DIFFERENTIAL/PLATELET
Eosinophils Absolute: 0.5 10*3/uL (ref 0.0–0.7)
Hemoglobin: 11.9 g/dL — ABNORMAL LOW (ref 13.0–17.0)
Lymphocytes Relative: 8 % — ABNORMAL LOW (ref 12–46)
Lymphs Abs: 0.5 10*3/uL — ABNORMAL LOW (ref 0.7–4.0)
Monocytes Relative: 11 % (ref 3–12)
Neutro Abs: 4.7 10*3/uL (ref 1.7–7.7)
Neutrophils Relative %: 74 % (ref 43–77)
Platelets: 35 10*3/uL — ABNORMAL LOW (ref 150–400)
RBC: 3.75 MIL/uL — ABNORMAL LOW (ref 4.22–5.81)
WBC: 6.4 10*3/uL (ref 4.0–10.5)

## 2012-08-31 LAB — COMPREHENSIVE METABOLIC PANEL
ALT: 17 U/L (ref 0–53)
Alkaline Phosphatase: 111 U/L (ref 39–117)
BUN: 18 mg/dL (ref 6–23)
Chloride: 105 mEq/L (ref 96–112)
GFR calc Af Amer: 59 mL/min — ABNORMAL LOW (ref >90)
Glucose, Bld: 100 mg/dL — ABNORMAL HIGH (ref 70–99)
Potassium: 3.5 mEq/L (ref 3.5–5.1)
Sodium: 134 mEq/L — ABNORMAL LOW (ref 135–145)
Total Bilirubin: 2.5 mg/dL — ABNORMAL HIGH (ref 0.3–1.2)
Total Protein: 6.1 g/dL (ref 6.0–8.3)

## 2012-08-31 MED ORDER — CYANOCOBALAMIN 1000 MCG/ML IJ SOLN
1000.0000 ug | Freq: Once | INTRAMUSCULAR | Status: AC
  Administered 2012-08-31: 1000 ug via INTRAMUSCULAR

## 2012-08-31 MED ORDER — CYANOCOBALAMIN 1000 MCG/ML IJ SOLN
INTRAMUSCULAR | Status: AC
  Filled 2012-08-31: qty 1

## 2012-08-31 NOTE — Patient Instructions (Addendum)
.  [facility] Discharge Instructions  RECOMMENDATIONS MADE BY THE CONSULTANT AND ANY TEST RESULTS WILL BE SENT TO YOUR REFERRING PHYSICIAN.  EXAM FINDINGS BY THE PHYSICIAN TODAY AND SIGNS OR SYMPTOMS TO REPORT TO CLINIC OR PRIMARY PHYSICIAN:  We will repeat all labs on Tuesday and hope to move on with chemo  MEDICATIONS PRESCRIBED:  Vitamin b 12 today  INSTRUCTIONS GIVEN AND DISCUSSED: Pet scan end of May then to see Dr. [name]   Thank you for choosing [facility] to provide your oncology and hematology care.  To afford each patient quality time with our providers, please arrive at least 15 minutes before your scheduled appointment time.  With your help, our goal is to use those 15 minutes to complete the necessary work-up to ensure our physicians have the information they need to help with your evaluation and healthcare recommendations.    Effective January 1st, 2014, we ask that you re-schedule your appointment with our physicians should you arrive 10 or more minutes late for your appointment.  We strive to give you quality time with our providers, and arriving late affects you and other patients whose appointments are after yours.    Again, thank you for choosing [facility].  Our hope is that these requests will decrease the amount of time that you wait before being seen by our physicians.       _____________________________________________________________  Should you have questions after your visit to [facility], please contact our office at [phone] between the hours of 8:30 a.m. and 5:00 p.m.  Voicemails left after 4:30 p.m. will not be returned until the following business day.  For prescription refill requests, have your pharmacy contact our office with your prescription refill request.

## 2012-08-31 NOTE — Progress Notes (Signed)
Labs drawn today for cbc/diff,cmp 

## 2012-08-31 NOTE — Progress Notes (Signed)
[name] presents today for injection per MD orders. B12 1000 mcg administered SQ in left Upper Arm. Administration without incident. Patient tolerated well.

## 2012-08-31 NOTE — Progress Notes (Signed)
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 He has been treated with FOLFOX plus Avastin. We did have to reduce his oxaliplatinum dose by 50% and the 5-FU by continuous infusion dose to 75%. He however has had a long run of nice results. Unfortunately is right upper lung lesion was growing and we've referred him to Dr. Stormy Card for consultation. SRS was done in [place] recently.   He tolerated the therapy very well. He feels good. Appetite is excellent. Weight is stable. He is having no pain. He actually looks very good. Bowels are working well. He is not short of breath.  He has no lymphadenopathy in any location. His abdomen is soft and nontender. I cannot feel his spleen tip and I cannot feel his liver edge. Bowel sounds are normal. Lungs are clear. He does have diminished breath sounds. Heart shows a regular rhythm and rate without murmur rub or gallop her Port-A-Cath is intact. No leg edema. He has a little puffiness the left leg but no tenderness no redness no warmth etc.  I want to re\re stage him in May with a PET scan since he cannot have IV dye her CAT scan. We will tentatively continue chemotherapy until that time since the lung lesion appeared to be the only place of progression.  His counts remained a major issue for [place]. He may need a break in therapy depending upon how they hold up or do not hold up.

## 2012-09-03 ENCOUNTER — Inpatient Hospital Stay (HOSPITAL_COMMUNITY): Payer: Managed Care, Other (non HMO)

## 2012-09-04 ENCOUNTER — Other Ambulatory Visit (HOSPITAL_COMMUNITY): Payer: Managed Care, Other (non HMO)

## 2012-09-04 ENCOUNTER — Encounter (HOSPITAL_COMMUNITY): Payer: Managed Care, Other (non HMO)

## 2012-09-04 ENCOUNTER — Encounter (HOSPITAL_BASED_OUTPATIENT_CLINIC_OR_DEPARTMENT_OTHER): Payer: Managed Care, Other (non HMO)

## 2012-09-04 VITALS — BP 120/63 | HR 75 | Temp 98.0°F | Resp 20 | Wt 217.4 lb

## 2012-09-04 DIAGNOSIS — C20 Malignant neoplasm of rectum: Secondary | ICD-10-CM

## 2012-09-04 DIAGNOSIS — Z95828 Presence of other vascular implants and grafts: Secondary | ICD-10-CM | POA: Insufficient documentation

## 2012-09-04 LAB — COMPREHENSIVE METABOLIC PANEL
Alkaline Phosphatase: 112 U/L (ref 39–117)
BUN: 17 mg/dL (ref 6–23)
CO2: 17 mEq/L — ABNORMAL LOW (ref 19–32)
Chloride: 107 mEq/L (ref 96–112)
GFR calc Af Amer: 63 mL/min — ABNORMAL LOW (ref >90)
GFR calc non Af Amer: 54 mL/min — ABNORMAL LOW (ref >90)
Glucose, Bld: 130 mg/dL — ABNORMAL HIGH (ref 70–99)
Potassium: 3.7 mEq/L (ref 3.5–5.1)
Total Bilirubin: 1.7 mg/dL — ABNORMAL HIGH (ref 0.3–1.2)
Total Protein: 5.8 g/dL — ABNORMAL LOW (ref 6.0–8.3)

## 2012-09-04 LAB — CBC WITH DIFFERENTIAL/PLATELET
Eosinophils Absolute: 0.5 10*3/uL (ref 0.0–0.7)
HCT: 32 % — ABNORMAL LOW (ref 39.0–52.0)
Lymphocytes Relative: 8 % — ABNORMAL LOW (ref 12–46)
Lymphs Abs: 0.5 10*3/uL — ABNORMAL LOW (ref 0.7–4.0)
MCHC: 33.8 g/dL (ref 30.0–36.0)
MCV: 95.8 fL (ref 78.0–100.0)
Monocytes Relative: 9 % (ref 3–12)
Neutro Abs: 4.1 10*3/uL (ref 1.7–7.7)
Platelets: 36 10*3/uL — ABNORMAL LOW (ref 150–400)
RDW: 18.1 % — ABNORMAL HIGH (ref 11.5–15.5)
WBC: 5.6 10*3/uL (ref 4.0–10.5)

## 2012-09-04 LAB — IRON AND TIBC
Saturation Ratios: 19 % — ABNORMAL LOW (ref 20–55)
TIBC: 281 ug/dL (ref 215–435)
UIBC: 227 ug/dL (ref 125–400)

## 2012-09-04 LAB — FOLATE: Folate: 16.9 ng/mL

## 2012-09-04 LAB — VITAMIN B12: Vitamin B-12: 1365 pg/mL — ABNORMAL HIGH (ref 211–911)

## 2012-09-04 MED ORDER — SODIUM CHLORIDE 0.9 % IV SOLN: Freq: Once | INTRAVENOUS | Status: DC

## 2012-09-04 MED ORDER — SODIUM CHLORIDE 0.9 % IV SOLN: 5.0000 mg/kg | Freq: Once | INTRAVENOUS | Status: DC

## 2012-09-04 MED ORDER — HEPARIN SOD (PORK) LOCK FLUSH 100 UNIT/ML IV SOLN
500.0000 [IU] | Freq: Once | INTRAVENOUS | Status: AC
  Administered 2012-09-04: 500 [IU] via INTRAVENOUS
  Filled 2012-09-04: qty 5

## 2012-09-04 MED ORDER — SODIUM CHLORIDE 0.9 % IJ SOLN
10.0000 mL | INTRAMUSCULAR | Status: DC | PRN
  Administered 2012-09-04: 10 mL via INTRAVENOUS
  Filled 2012-09-04: qty 10

## 2012-09-04 MED ORDER — HEPARIN SOD (PORK) LOCK FLUSH 100 UNIT/ML IV SOLN
INTRAVENOUS | Status: AC
  Filled 2012-09-04: qty 5

## 2012-09-04 NOTE — Progress Notes (Signed)
Chemo held today due to low platelet count. Is Scheduled for scans at [facility] next Thursday.  Appt made to see Dr [name] next Friday.  Home accompanied by spouse.

## 2012-09-04 NOTE — Progress Notes (Signed)
[name] presented for Portacath access and flush. Proper placement of portacath confirmed by CXR. Portacath located  chest wall accessed with  H 20 needle. Good blood return present. Portacath flushed with 20ml NS and 500U/42ml Heparin and needle removed intact. Procedure without incident. Patient tolerated procedure well.

## 2012-09-05 ENCOUNTER — Ambulatory Visit (HOSPITAL_COMMUNITY): Payer: Managed Care, Other (non HMO)

## 2012-09-06 ENCOUNTER — Encounter: Payer: Self-pay | Admitting: Radiation Oncology

## 2012-09-06 ENCOUNTER — Ambulatory Visit (HOSPITAL_COMMUNITY): Payer: Managed Care, Other (non HMO)

## 2012-09-11 ENCOUNTER — Other Ambulatory Visit (HOSPITAL_COMMUNITY): Payer: Managed Care, Other (non HMO)

## 2012-09-12 ENCOUNTER — Encounter (HOSPITAL_COMMUNITY): Payer: Self-pay | Admitting: Oncology

## 2012-09-12 ENCOUNTER — Encounter (HOSPITAL_BASED_OUTPATIENT_CLINIC_OR_DEPARTMENT_OTHER): Payer: Managed Care, Other (non HMO) | Admitting: Oncology

## 2012-09-12 VITALS — BP 110/70 | HR 68 | Temp 97.8°F | Resp 18 | Wt 218.3 lb

## 2012-09-12 DIAGNOSIS — D696 Thrombocytopenia, unspecified: Secondary | ICD-10-CM

## 2012-09-12 DIAGNOSIS — C20 Malignant neoplasm of rectum: Secondary | ICD-10-CM

## 2012-09-12 DIAGNOSIS — C787 Secondary malignant neoplasm of liver and intrahepatic bile duct: Secondary | ICD-10-CM

## 2012-09-12 DIAGNOSIS — Z923 Personal history of irradiation: Secondary | ICD-10-CM | POA: Insufficient documentation

## 2012-09-12 DIAGNOSIS — C7800 Secondary malignant neoplasm of unspecified lung: Secondary | ICD-10-CM

## 2012-09-12 NOTE — Patient Instructions (Addendum)
[facility] Discharge Instructions  RECOMMENDATIONS MADE BY THE CONSULTANT AND ANY TEST RESULTS WILL BE SENT TO YOUR REFERRING PHYSICIAN.  EXAM FINDINGS BY THE PHYSICIAN TODAY AND SIGNS OR SYMPTOMS TO REPORT TO CLINIC OR PRIMARY PHYSICIAN: Discussion by MD. We will put your chemotherapy on hold until after the PET scan.  Will do PET scan on 5/13 and see you the same day.  MEDICATIONS PRESCRIBED:  none  INSTRUCTIONS GIVEN AND DISCUSSED: Report uncontrolled pain, nausea, vomiting, bleeding, fevers, etc. PET scan on 5/13 - arrive at [facility] at 7:45 am.  Nothing to eat or drink 6 hours prior.  Nothing with sugar, no chewing gum, mints, hard candy, etc.  SPECIAL INSTRUCTIONS/FOLLOW-UP: See Schedule  Thank you for choosing [facility] to provide your oncology and hematology care.  To afford each patient quality time with our providers, please arrive at least 15 minutes before your scheduled appointment time.  With your help, our goal is to use those 15 minutes to complete the necessary work-up to ensure our physicians have the information they need to help with your evaluation and healthcare recommendations.    Effective January 1st, 2014, we ask that you re-schedule your appointment with our physicians should you arrive 10 or more minutes late for your appointment.  We strive to give you quality time with our providers, and arriving late affects you and other patients whose appointments are after yours.    Again, thank you for choosing [facility].  Our hope is that these requests will decrease the amount of time that you wait before being seen by our physicians.       _____________________________________________________________  Should you have questions after your visit to [facility], please contact our office at [phone] between the hours of 8:30 a.m. and 5:00 p.m.  Voicemails left after 4:30 p.m. will not be  returned until the following business day.  For prescription refill requests, have your pharmacy contact our office with your prescription refill request.

## 2012-09-12 NOTE — Progress Notes (Signed)
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 He has been on chemotherapy but we are having major issues with thrombocytopenia. We have not been able treat him since 07/19/2012. He feels much better he states since she's had radiation therapy by Dr. Kathrynn Running to the lung lesion.  He looks good today his vital signs are fine but his albumin recent was only 2.5 g. He does have 1+ edema at the ankles only no calf tenderness swelling or calf etc. There is no redness or cords.  I am therefore going to give him a break in chemotherapy to get the PET scan in May and I will see him the same day to go over things with him then decide what we need to do. He is fine with this plan as is his wife.

## 2012-09-13 ENCOUNTER — Ambulatory Visit
Admit: 2012-09-13 | Discharge: 2012-09-13 | Disposition: A | Discharge status: Home / Self Care | Payer: Medicare Other | Attending: Radiation Oncology | Admitting: Radiation Oncology

## 2012-09-13 ENCOUNTER — Encounter: Payer: Self-pay | Admitting: Radiation Oncology

## 2012-09-13 VITALS — BP 115/64 | HR 71 | Temp 97.0°F | Resp 18 | Wt 219.7 lb

## 2012-09-13 DIAGNOSIS — C7800 Secondary malignant neoplasm of unspecified lung: Secondary | ICD-10-CM

## 2012-09-13 NOTE — Progress Notes (Signed)
Radiation Oncology         [phone]) [phone] ________________________________  Name: [name] MRN: [id number]  Date: 09/13/2012  DOB: [birth year]  Follow-Up Visit Note  CC: [name], MD  Randall An, MD  Diagnosis:   62 year old gentleman with a 2.3 cm solitary lung metastasis and liver metastases from rectal cancer - Stage IV  Interval Since Last Radiation:  one month  Narrative:  The patient returns today for routine follow-up.  He is without complaint.  Today we talked a little bit about his lung follow up and also talked about his liver mets.  I made an error when I met with him a month ago. In reviewing his records then, I wasn't able to confirm where the patient's liver mets were diagnosed, and speculated to the patient that maybe they were under control or worse, never there.  Obviously, this led to some confusion on his part.  For whatever reason, maybe I was in a hurry that day, I failed to review the patient's PET CT in 2012 which shows at least two liver mets which were also seen during cholecystectomy.  I apologized to the patient for my error and showed him what I missed before.  He was very good natured about it, and nonplussed.   ALLERGIES:  is allergic to daypro.  Meds: Current Outpatient Prescriptions  Medication Sig Dispense Refill  . Acetaminophen (TYLENOL ARTHRITIS PAIN PO) Take 1,200 mg by mouth daily as needed. As needed for arthritic pain      . cyanocobalamin (,VITAMIN B-12,) 1000 MCG/ML injection Inject 1,000 mcg into the muscle every 30 (thirty) days.       Marland Kitchen dexamethasone (DECADRON) 4 MG tablet STARTING THE DAY AFTER CHEMO TAKE TWO TABLETS BY MOUTH EVERY DAY IN THE MORNING FOR 2 DAYS WITH FOOD  30 tablet  1  . HYDROcodone-acetaminophen (NORCO/VICODIN) 5-325 MG per tablet Take 1 tablet by mouth every 6 (six) hours as needed for pain.      Marland Kitchen lidocaine-prilocaine (EMLA) cream Apply 1 application topically as needed. Apply quarter sized amount over  port-a-cath site one hour prior to chemotherapy appointment.      Marland Kitchen LORazepam (ATIVAN) 1 MG tablet TAKE ONE TABLET BY MOUTH EVERY 4 HOURS AS NEEDED FOR NAUSEA AND VOMITING  30 tablet  1  . ondansetron (ZOFRAN) 8 MG tablet Take 1 tablet two times a day starting the day after chemo for 2 days. Then take 1 tablet two times a day as needed for nausea or vomiting.  30 tablet  1  . prochlorperazine (COMPAZINE) 10 MG tablet TAKE ONE TABLET BY MOUTH EVERY 6 HOURS AS NEEDED FOR NAUSEA AND VOMITING  30 tablet  0   No current facility-administered medications for this encounter.   Facility-Administered Medications Ordered in Other Encounters  Medication Dose Route Frequency Provider Last Rate Last Dose  . 0.9 %  sodium chloride infusion   Intravenous Continuous Randall An, MD 20 mL/hr at 07/17/12 1113    . heparin lock flush 100 unit/mL  500 Units Intravenous Once Randall An, MD      . sodium chloride 0.9 % injection 10 mL  10 mL Intravenous PRN Randall An, MD   10 mL at 07/17/12 1113    Physical Findings: The patient is in no acute distress. Patient is alert and oriented.  weight is 219 lb 11.2 oz (99.655 kg). His oral temperature is 97 F (36.1 C). His blood pressure is 115/64 and  his pulse is 71. His respiration is 18 and oxygen saturation is 100%. . Lungs Clear. No significant changes.  Lab Findings: Lab Results  Component Value Date   WBC 5.6 09/04/2012   HGB 10.8* 09/04/2012   HCT 32.0* 09/04/2012   MCV 95.8 09/04/2012   PLT 36* 09/04/2012    Impression:  The patient is recovering from the effects of radiation.   Plan:  He'll have scan in May and see Dr. [name].  He'll return here in 6 months.  _____________________________________  Artist Pais. Kathrynn Running, M.D.

## 2012-09-13 NOTE — Progress Notes (Signed)
Patient presents to the clinic today accompanied by his son for follow up with Dr. Kathrynn Running. Patient is alert and oriented to person, place, and time. No distress noted. Steady gait noted. Pleasant affect noted. Patient denies pain at this time. Patient reports that his energy is returning and he is much more active. Patient hopes to play golf soon with his son. Patient reports that he is scheduled for an iron infusion on Monday. Patient reports that he is scheduled for a scan on 10/09/2012 and will not have chemo until after scan. Patient reports that he was told his tumor marker were down from 5 to 4. Patient reports shortness of breath is much better following radiation. Patient reports an occasional productive cough with white sputum. Patient denies hemoptysis. Patient reports occasional bloody nose. Patient denies nausea, vomiting, headache, or dizziness. Patient reports an excellent appetite. Patient denies night sweats. Reported all findings to Dr. Kathrynn Running.

## 2012-09-17 ENCOUNTER — Encounter (HOSPITAL_BASED_OUTPATIENT_CLINIC_OR_DEPARTMENT_OTHER): Payer: Managed Care, Other (non HMO)

## 2012-09-17 ENCOUNTER — Encounter (HOSPITAL_COMMUNITY): Payer: Self-pay | Admitting: *Deleted

## 2012-09-17 DIAGNOSIS — D649 Anemia, unspecified: Secondary | ICD-10-CM

## 2012-09-17 DIAGNOSIS — C20 Malignant neoplasm of rectum: Secondary | ICD-10-CM

## 2012-09-17 LAB — CBC WITH DIFFERENTIAL/PLATELET
HCT: 33.3 % — ABNORMAL LOW (ref 39.0–52.0)
Hemoglobin: 11.3 g/dL — ABNORMAL LOW (ref 13.0–17.0)
Lymphocytes Relative: 10 % — ABNORMAL LOW (ref 12–46)
Lymphs Abs: 0.4 10*3/uL — ABNORMAL LOW (ref 0.7–4.0)
Monocytes Absolute: 0.5 10*3/uL (ref 0.1–1.0)
Monocytes Relative: 11 % (ref 3–12)
Neutro Abs: 3.4 10*3/uL (ref 1.7–7.7)
Neutrophils Relative %: 76 % (ref 43–77)
RBC: 3.54 MIL/uL — ABNORMAL LOW (ref 4.22–5.81)
WBC: 4.4 10*3/uL (ref 4.0–10.5)

## 2012-09-17 MED ORDER — SODIUM CHLORIDE 0.9 % IV SOLN
1020.0000 mg | Freq: Once | INTRAVENOUS | Status: AC
  Administered 2012-09-17: 1020 mg via INTRAVENOUS
  Filled 2012-09-17: qty 34

## 2012-09-17 MED ORDER — HEPARIN SOD (PORK) LOCK FLUSH 100 UNIT/ML IV SOLN
500.0000 [IU] | Freq: Once | INTRAVENOUS | Status: AC
  Administered 2012-09-17: 500 [IU] via INTRAVENOUS
  Filled 2012-09-17: qty 5

## 2012-09-17 MED ORDER — HEPARIN SOD (PORK) LOCK FLUSH 100 UNIT/ML IV SOLN
INTRAVENOUS | Status: AC
  Filled 2012-09-17: qty 5

## 2012-09-17 MED ORDER — SODIUM CHLORIDE 0.9 % IV SOLN
Freq: Once | INTRAVENOUS | Status: AC
  Administered 2012-09-17: 10:00:00 via INTRAVENOUS

## 2012-09-17 NOTE — Progress Notes (Signed)
Tolerated well

## 2012-09-28 ENCOUNTER — Encounter (HOSPITAL_COMMUNITY): Payer: Managed Care, Other (non HMO) | Attending: Oncology

## 2012-09-28 ENCOUNTER — Ambulatory Visit (HOSPITAL_COMMUNITY): Payer: Managed Care, Other (non HMO) | Admitting: Oncology

## 2012-09-28 VITALS — BP 111/66 | HR 68 | Resp 18

## 2012-09-28 DIAGNOSIS — C20 Malignant neoplasm of rectum: Secondary | ICD-10-CM | POA: Insufficient documentation

## 2012-09-28 DIAGNOSIS — E538 Deficiency of other specified B group vitamins: Secondary | ICD-10-CM | POA: Insufficient documentation

## 2012-09-28 MED ORDER — CYANOCOBALAMIN 1000 MCG/ML IJ SOLN
1000.0000 ug | Freq: Once | INTRAMUSCULAR | Status: AC
  Administered 2012-09-28: 1000 ug via INTRAMUSCULAR

## 2012-09-28 MED ORDER — CYANOCOBALAMIN 1000 MCG/ML IJ SOLN
INTRAMUSCULAR | Status: AC
  Filled 2012-09-28: qty 1

## 2012-09-28 NOTE — Progress Notes (Signed)
[name] presents today for injection per the provider's orders.  Vitamin  administered administration without incident; see MAR for injection details.  Patient tolerated procedure well and without incident.  No questions or complaints noted at this time.

## 2012-10-01 ENCOUNTER — Ambulatory Visit (HOSPITAL_COMMUNITY): Payer: Managed Care, Other (non HMO) | Admitting: Oncology

## 2012-10-09 ENCOUNTER — Encounter (HOSPITAL_COMMUNITY)
Admit: 2012-10-09 | Discharge: 2012-10-09 | Disposition: A | Discharge status: Home / Self Care | Payer: Managed Care, Other (non HMO) | Attending: Oncology | Admitting: Oncology

## 2012-10-09 ENCOUNTER — Encounter (HOSPITAL_COMMUNITY): Payer: Self-pay | Admitting: Oncology

## 2012-10-09 ENCOUNTER — Encounter (HOSPITAL_BASED_OUTPATIENT_CLINIC_OR_DEPARTMENT_OTHER): Payer: Managed Care, Other (non HMO) | Admitting: Oncology

## 2012-10-09 VITALS — BP 112/72 | HR 78 | Temp 98.4°F | Resp 18 | Wt 226.9 lb

## 2012-10-09 DIAGNOSIS — N200 Calculus of kidney: Secondary | ICD-10-CM | POA: Insufficient documentation

## 2012-10-09 DIAGNOSIS — C20 Malignant neoplasm of rectum: Secondary | ICD-10-CM

## 2012-10-09 DIAGNOSIS — C787 Secondary malignant neoplasm of liver and intrahepatic bile duct: Secondary | ICD-10-CM

## 2012-10-09 DIAGNOSIS — C3490 Malignant neoplasm of unspecified part of unspecified bronchus or lung: Secondary | ICD-10-CM | POA: Insufficient documentation

## 2012-10-09 DIAGNOSIS — C7800 Secondary malignant neoplasm of unspecified lung: Secondary | ICD-10-CM

## 2012-10-09 DIAGNOSIS — N62 Hypertrophy of breast: Secondary | ICD-10-CM | POA: Insufficient documentation

## 2012-10-09 DIAGNOSIS — C189 Malignant neoplasm of colon, unspecified: Secondary | ICD-10-CM | POA: Insufficient documentation

## 2012-10-09 DIAGNOSIS — R161 Splenomegaly, not elsewhere classified: Secondary | ICD-10-CM | POA: Insufficient documentation

## 2012-10-09 MED ORDER — FLUDEOXYGLUCOSE F - 18 (FDG) INJECTION
18.3000 | Freq: Once | INTRAVENOUS | Status: AC | PRN
  Administered 2012-10-09: 18.3 via INTRAVENOUS

## 2012-10-09 NOTE — Progress Notes (Signed)
[name] presented for [employer]. Labs per MD order drawn via Peripheral Line 23 gauge needle inserted in right AC   Good blood return present. Procedure without incident.  Needle removed intact. Patient tolerated procedure well.

## 2012-10-09 NOTE — Progress Notes (Signed)
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 Interestingly his PET scan done the other day showed activity that's consistent with malignancy truly only in the right upper lobe nodule which has been treated already with radiation therapy. He is feeling better each day that he is off chemotherapy and has been several weeks since we were able to treat him. His last treatment was actually on the 20th 2014.  Since that time he has had difficulty with his blood counts.  He still feels great and looks great and vital signs are stable. Review of systems is otherwise noncontributory. Appetite is returning fantastically he states.  I have sent a note to Dr. Kathrynn Running for his opinion concerning his right upper lobe nodule and whether he can help [place] further since there no obvious liver metastases at this time.  I do not think we need to initiate maintenance therapy presently especially since his blood counts have been very difficult to manage at times.  He actually presented with metastatic recurrent documented disease in May 2012 therefore is out 2 full years.  Tentatively we will see him back here in a month to 2 months. He does not hear from me by Friday he will call us.

## 2012-10-09 NOTE — Patient Instructions (Addendum)
[facility] Discharge Instructions  RECOMMENDATIONS MADE BY THE CONSULTANT AND ANY TEST RESULTS WILL BE SENT TO YOUR REFERRING PHYSICIAN.  EXAM FINDINGS BY THE PHYSICIAN TODAY AND SIGNS OR SYMPTOMS TO REPORT TO CLINIC OR PRIMARY PHYSICIAN: discussion by MD.  Only thing on scan is the lung nodule.  Everything else is good.  MEDICATIONS PRESCRIBED:  none  INSTRUCTIONS GIVEN AND DISCUSSED: Report blood in stool, abdominal discomfort or other problems.  SPECIAL INSTRUCTIONS/FOLLOW-UP: Blood work to day and every 4 weeks & to see MD in 2 months.  Thank you for choosing [facility] to provide your oncology and hematology care.  To afford each patient quality time with our providers, please arrive at least 15 minutes before your scheduled appointment time.  With your help, our goal is to use those 15 minutes to complete the necessary work-up to ensure our physicians have the information they need to help with your evaluation and healthcare recommendations.    Effective January 1st, 2014, we ask that you re-schedule your appointment with our physicians should you arrive 10 or more minutes late for your appointment.  We strive to give you quality time with our providers, and arriving late affects you and other patients whose appointments are after yours.    Again, thank you for choosing [facility].  Our hope is that these requests will decrease the amount of time that you wait before being seen by our physicians.       _____________________________________________________________  Should you have questions after your visit to [facility], please contact our office at [phone] between the hours of 8:30 a.m. and 5:00 p.m.  Voicemails left after 4:30 p.m. will not be returned until the following business day.  For prescription refill requests, have your pharmacy contact our office with your prescription refill request.

## 2012-10-12 ENCOUNTER — Other Ambulatory Visit (HOSPITAL_COMMUNITY): Payer: Self-pay | Admitting: Oncology

## 2012-10-12 ENCOUNTER — Telehealth (HOSPITAL_COMMUNITY): Payer: Self-pay

## 2012-10-12 DIAGNOSIS — C7800 Secondary malignant neoplasm of unspecified lung: Secondary | ICD-10-CM

## 2012-10-12 DIAGNOSIS — C20 Malignant neoplasm of rectum: Secondary | ICD-10-CM

## 2012-10-12 NOTE — Telephone Encounter (Signed)
Message left on pateint's voicemail that per Dr. [name] that Dr. [name] has talked with Dr. Kathrynn Running about findings on PET scan and they both feel what was seen on the PET scan in the lung was residual from radiation therapy and that we needed to do CT scans of chest, abdomen and pelvis in 3 months.  Scans scheduled for 8/18 and patient will need to come by and pick up prep.  Call back confirmation requested.

## 2012-10-16 ENCOUNTER — Ambulatory Visit (HOSPITAL_COMMUNITY): Payer: Managed Care, Other (non HMO)

## 2012-10-29 ENCOUNTER — Encounter (HOSPITAL_COMMUNITY): Payer: Managed Care, Other (non HMO) | Attending: Oncology

## 2012-10-29 VITALS — BP 123/79 | HR 67 | Resp 18

## 2012-10-29 DIAGNOSIS — C20 Malignant neoplasm of rectum: Secondary | ICD-10-CM | POA: Insufficient documentation

## 2012-10-29 DIAGNOSIS — E538 Deficiency of other specified B group vitamins: Secondary | ICD-10-CM

## 2012-10-29 MED ORDER — CYANOCOBALAMIN 1000 MCG/ML IJ SOLN
1000.0000 ug | Freq: Once | INTRAMUSCULAR | Status: AC
  Administered 2012-10-29: 1000 ug via INTRAMUSCULAR

## 2012-10-29 MED ORDER — CYANOCOBALAMIN 1000 MCG/ML IJ SOLN
INTRAMUSCULAR | Status: AC
  Filled 2012-10-29: qty 1

## 2012-10-29 NOTE — Progress Notes (Signed)
[name] presents today for injection per the provider's orders.  Vitamin B12 administered administration without incident; see MAR for injection details.  Patient tolerated procedure well and without incident.  No questions or complaints noted at this time.

## 2012-10-31 ENCOUNTER — Ambulatory Visit (HOSPITAL_COMMUNITY): Payer: Managed Care, Other (non HMO) | Admitting: Oncology

## 2012-11-02 ENCOUNTER — Ambulatory Visit (HOSPITAL_COMMUNITY): Payer: Managed Care, Other (non HMO) | Admitting: Oncology

## 2012-11-06 ENCOUNTER — Encounter (HOSPITAL_BASED_OUTPATIENT_CLINIC_OR_DEPARTMENT_OTHER): Payer: Managed Care, Other (non HMO)

## 2012-11-06 DIAGNOSIS — C20 Malignant neoplasm of rectum: Secondary | ICD-10-CM

## 2012-11-06 NOTE — Progress Notes (Signed)
[name]'s reason for visit today are for labs as scheduled per MD orders.  Venipuncture performed with a 23 gauge butterfly needle to R Antecubital.  [name] tolerated venipuncture well and without incident; questions were answered and patient was discharged.

## 2012-11-07 LAB — CEA: CEA: 1.8 ng/mL (ref 0.0–5.0)

## 2012-11-20 ENCOUNTER — Encounter (HOSPITAL_BASED_OUTPATIENT_CLINIC_OR_DEPARTMENT_OTHER): Payer: Managed Care, Other (non HMO)

## 2012-11-20 DIAGNOSIS — C20 Malignant neoplasm of rectum: Secondary | ICD-10-CM

## 2012-11-20 DIAGNOSIS — Z452 Encounter for adjustment and management of vascular access device: Secondary | ICD-10-CM

## 2012-11-20 MED ORDER — SODIUM CHLORIDE 0.9 % IJ SOLN
20.0000 mL | INTRAMUSCULAR | Status: DC | PRN
  Administered 2012-11-20: 20 mL via INTRAVENOUS
  Filled 2012-11-20: qty 20

## 2012-11-20 MED ORDER — HEPARIN SOD (PORK) LOCK FLUSH 100 UNIT/ML IV SOLN
INTRAVENOUS | Status: AC
  Filled 2012-11-20: qty 5

## 2012-11-20 MED ORDER — HEPARIN SOD (PORK) LOCK FLUSH 100 UNIT/ML IV SOLN
500.0000 [IU] | Freq: Once | INTRAVENOUS | Status: AC
  Administered 2012-11-20: 500 [IU] via INTRAVENOUS
  Filled 2012-11-20: qty 5

## 2012-11-20 NOTE — Progress Notes (Signed)
[name] presented for Portacath access and flush. Proper placement of portacath confirmed by CXR. Portacath located left chest wall accessed with  H 20 needle. Good blood return present. Portacath flushed with 20ml NS and 500U/34ml Heparin and needle removed intact. Procedure without incident. Patient tolerated procedure well.

## 2012-12-04 ENCOUNTER — Encounter (HOSPITAL_COMMUNITY): Payer: Managed Care, Other (non HMO) | Attending: Oncology

## 2012-12-04 DIAGNOSIS — E538 Deficiency of other specified B group vitamins: Secondary | ICD-10-CM

## 2012-12-04 DIAGNOSIS — C787 Secondary malignant neoplasm of liver and intrahepatic bile duct: Secondary | ICD-10-CM | POA: Insufficient documentation

## 2012-12-04 DIAGNOSIS — C20 Malignant neoplasm of rectum: Secondary | ICD-10-CM | POA: Insufficient documentation

## 2012-12-04 DIAGNOSIS — C7800 Secondary malignant neoplasm of unspecified lung: Secondary | ICD-10-CM | POA: Insufficient documentation

## 2012-12-04 MED ORDER — CYANOCOBALAMIN 1000 MCG/ML IJ SOLN
INTRAMUSCULAR | Status: AC
  Filled 2012-12-04: qty 1

## 2012-12-04 MED ORDER — CYANOCOBALAMIN 1000 MCG/ML IJ SOLN
1000.0000 ug | Freq: Once | INTRAMUSCULAR | Status: AC
  Administered 2012-12-04: 1000 ug via INTRAMUSCULAR

## 2012-12-04 NOTE — Progress Notes (Signed)
[name] presents today for injection per MD orders. B12 1000 mcg administered SQ in left Upper Arm. Administration without incident. Patient tolerated well.

## 2012-12-04 NOTE — Progress Notes (Signed)
Labs drawn today for cea 

## 2012-12-05 LAB — CEA: CEA: 1.7 ng/mL (ref 0.0–5.0)

## 2012-12-11 ENCOUNTER — Encounter (HOSPITAL_BASED_OUTPATIENT_CLINIC_OR_DEPARTMENT_OTHER): Payer: Managed Care, Other (non HMO)

## 2012-12-11 ENCOUNTER — Encounter (HOSPITAL_COMMUNITY): Payer: Self-pay

## 2012-12-11 VITALS — BP 130/84 | HR 59 | Temp 98.1°F | Resp 16 | Wt 225.3 lb

## 2012-12-11 DIAGNOSIS — C787 Secondary malignant neoplasm of liver and intrahepatic bile duct: Secondary | ICD-10-CM

## 2012-12-11 DIAGNOSIS — C20 Malignant neoplasm of rectum: Secondary | ICD-10-CM

## 2012-12-11 DIAGNOSIS — C7800 Secondary malignant neoplasm of unspecified lung: Secondary | ICD-10-CM

## 2012-12-11 NOTE — Patient Instructions (Addendum)
[facility] Discharge Instructions  RECOMMENDATIONS MADE BY THE CONSULTANT AND ANY TEST RESULTS WILL BE SENT TO YOUR REFERRING PHYSICIAN.  EXAM FINDINGS BY THE PHYSICIAN TODAY AND SIGNS OR SYMPTOMS TO REPORT TO CLINIC OR PRIMARY PHYSICIAN: Exam and discussion by Dr. Sharia Reeve.  You are doing well.  MEDICATIONS PRESCRIBED:  none  INSTRUCTIONS GIVEN AND DISCUSSED: Report changes in bowel habits, blood in your bowel movement or other problems.  SPECIAL INSTRUCTIONS/FOLLOW-UP: Port Flushes, CT scans as scheduled and follow-up afterwards.  Thank you for choosing [facility] to provide your oncology and hematology care.  To afford each patient quality time with our providers, please arrive at least 15 minutes before your scheduled appointment time.  With your help, our goal is to use those 15 minutes to complete the necessary work-up to ensure our physicians have the information they need to help with your evaluation and healthcare recommendations.    Effective January 1st, 2014, we ask that you re-schedule your appointment with our physicians should you arrive 10 or more minutes late for your appointment.  We strive to give you quality time with our providers, and arriving late affects you and other patients whose appointments are after yours.    Again, thank you for choosing [facility].  Our hope is that these requests will decrease the amount of time that you wait before being seen by our physicians.       _____________________________________________________________  Should you have questions after your visit to [facility], please contact our office at [phone] between the hours of 8:30 a.m. and 5:00 p.m.  Voicemails left after 4:30 p.m. will not be returned until the following business day.  For prescription refill requests, have your pharmacy contact our office with your prescription refill request.

## 2012-12-11 NOTE — Progress Notes (Signed)
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 His last PET/CT result in May of 2014 showed;  " Persistent malignant range FDG uptake associated with the right upper lobe nodule. The degree of FDG uptake is not significantly improved from previous exam The nodule in the right upper lobedemonstrates interval cavitation compared with previous exam.  Decrease in hypermetabolism associated with multifocal liver metastasis. On today's study there is no abnormal uptake within  the liver above background activity."  [name] 62 y.o. male returns to the clinic today for scheduled follow up he tells me he feels well denies any pain or shortness of breath no change in appetite does complain of mild on and off right shoulder pain for several years which he attributes to arthritis he states that he's eating well and his weight is stable  He tells me that he also notices occasional mild bleeding from the colostomy stump otherwise no melena or hematochezia. He also reports having but the kidney stone the underwent denies any new complaints .   MEDICAL HISTORY: Past Medical History  Diagnosis Date  . Cancer     Colon ca dx 2008 surg/rad/chemo  . Pancytopenia, acquired 12/04/2010  . B12 deficiency 12/07/2010  . Lung cancer   . Hypertension   . Diabetes mellitus   . Hypercholesteremia   . Kidney stones   . CAD (coronary artery disease)   . Anemia   . History of radiation therapy 06/19/07 -07/27/07    whole pelvis  . Hx of radiation therapy 08/06/12,08/09/12,& 08/13/12    rul 54GY/81fx    ALLERGIES:  is allergic to daypro.  MEDICATIONS:  Current Outpatient Prescriptions    Medication Sig Dispense Refill  . Acetaminophen (TYLENOL ARTHRITIS PAIN PO) Take 1,200 mg by mouth daily as needed. As needed for arthritic pain      . cyanocobalamin (,VITAMIN B-12,) 1000 MCG/ML injection Inject 1,000 mcg into the muscle every 30 (thirty) days.       Marland Kitchen HYDROcodone-acetaminophen (NORCO/VICODIN) 5-325 MG per tablet Take 1 tablet by mouth every 6 (six) hours as needed for pain.      Marland Kitchen lidocaine-prilocaine (EMLA) cream Apply 1 application topically as needed. Apply quarter sized amount over port-a-cath site one hour prior to chemotherapy appointment.      Marland Kitchen LORazepam (ATIVAN) 1 MG tablet TAKE ONE TABLET BY MOUTH EVERY 4 HOURS AS NEEDED FOR NAUSEA AND VOMITING  30 tablet  1   No current facility-administered medications for this visit.   Facility-Administered Medications Ordered in Other Visits  Medication Dose Route Frequency Provider Last Rate Last Dose  . 0.9 %  sodium chloride infusion   Intravenous Continuous Randall An, MD 20 mL/hr at 07/17/12 1113    . heparin lock flush 100 unit/mL  500 Units Intravenous Once Randall An, MD      . sodium chloride 0.9 % injection 10 mL  10 mL Intravenous PRN Randall An, MD   10 mL at 07/17/12 1113    SURGICAL HISTORY:  Past Surgical History  Procedure Laterality Date  . Ileostomy  12/07/2010    Procedure: ILEOSTOMY;  Surgeon: [name];  Location:  [facility];  Service: General;  Laterality: N/A;  Diverting Ileostomy Lysis of adhesions Exploratory Laparotomy  . Lung biopsy  10/27/10    rt lobe- adenocarcinoma  . Cholecystectomy    . Cataract extraction w/ intraocular lens implant  2007    bil  . Lithotripsy  2002  . Coronary angioplasty with stent placement  2003    stenting x 2  . Colon resection  2008     REVIEW OF SYSTEMS: 14 point review of system is as in the history above otherwise negative.  PHYSICAL EXAMINATION:  Blood pressure 130/84, pulse 59, temperature 98.1 F (36.7 C), temperature source Oral,  resp. rate 16, weight 225 lb 4.8 oz (102.195 kg). GENERAL: No distress, obese.  SKIN:  No rashes or significant lesions , few skin bruises. HEAD: Normocephalic, No masses, lesions, tenderness or abnormalities  EYES: Conjunctiva are pink and non-injected  ENT: External ears normal ,lips, buccal mucosa, and tongue normal and mucous membranes are moist  LYMPH: No palpable lymphadenopathy, in the neck, axilla or supraclavicular areas LUNGS: clear to auscultation , no crackles or wheezes HEART: regular rate & rhythm, no murmurs, no gallops, S1 normal and S2 normal  ABDOMEN: Abdomen soft, non-tender, normal bowel sounds, no masses or organomegaly and no hepatosplenomegaly palpable. MSK: No CVA tenderness and no tenderness on percussion of the back or rib cage. EXTREMITIES: No edema, no skin discoloration or tenderness NEURO: alert & oriented , no focal motor/sensory deficits.     LABORATORY DATA: Lab Results  Component Value Date   WBC 4.4 09/17/2012   HGB 11.3* 09/17/2012   HCT 33.3* 09/17/2012   MCV 94.1 09/17/2012   PLT 39* 09/17/2012      Chemistry      Component Value Date/Time   NA 134* 09/04/2012 0928   K 3.7 09/04/2012 0928   CL 107 09/04/2012 0928   CO2 17* 09/04/2012 0928   BUN 17 09/04/2012 0928   CREATININE 1.39* 09/04/2012 0928      Component Value Date/Time   CALCIUM 8.8 09/04/2012 0928   ALKPHOS 112 09/04/2012 0928   AST 29 09/04/2012 0928   ALT 15 09/04/2012 0928   BILITOT 1.7* 09/04/2012 0928       RADIOGRAPHIC STUDIES: No results found.   ASSESSMENT:  Patient is clinically stable at this time and drug holiday.  He has an upcoming scan scheduled next month and her son-in-law and he has a followup visit after that which you will keep.  His CEA is essentially unremarkable.  PLAN:  1. Return to clinic as scheduled after his CAT scan.  At that time will evaluate the CAT scan result and see what his in next most appropriate step to take.   All questions were satisfactorily  answered. patient knows to call if he has any concern.  I spent more than 50 % counseling the patient face to face. The total time spent in the appointment was 30 minutes.   Sherral Hammers, MD FACP. Hematology/Oncology.

## 2012-12-29 IMAGING — US US ABDOMEN COMPLETE
1 series · 14 of 25 positions shown · non-contrast
Comparison: PET CT 10/13/2010 and CT abdomen pelvis 07/13/2010.
Ultrasound abdomen 03/06/2008.

CLINICAL DATA: Colon cancer with possible metastatic disease.
Known gallbladder disease.  Diarrhea, nausea and weight loss.

COMPLETE ABDOMINAL ULTRASOUND

[Series 1: us abdomen complete · 0.32mm/px · 14 of 92 slices shown]
[im 1/92]
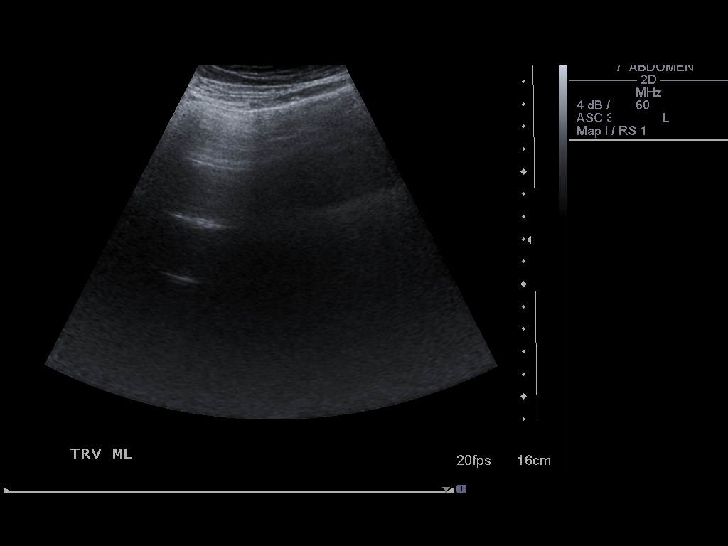
[im 8/92]
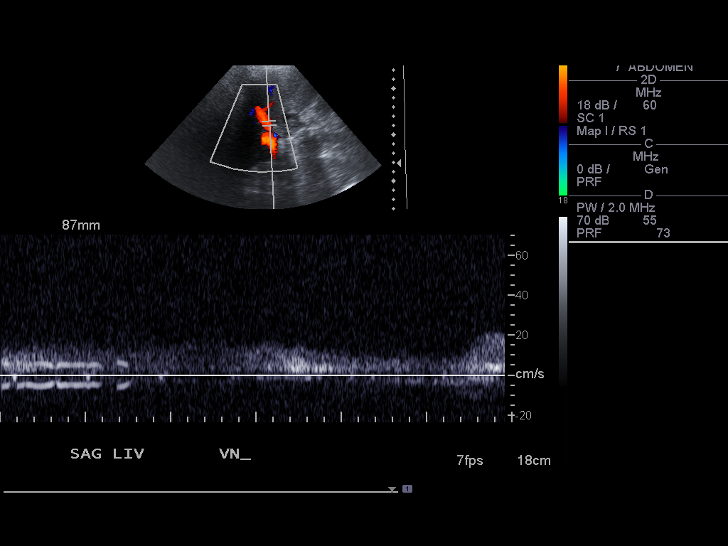
[im 16/92]
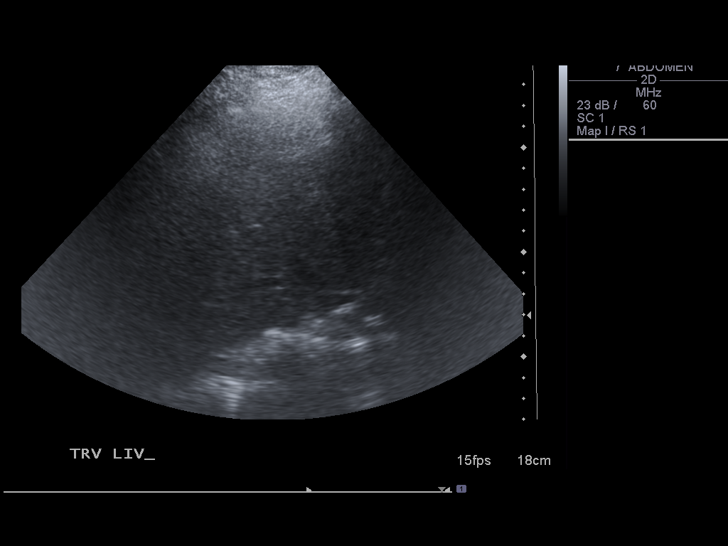
[im 23/92]
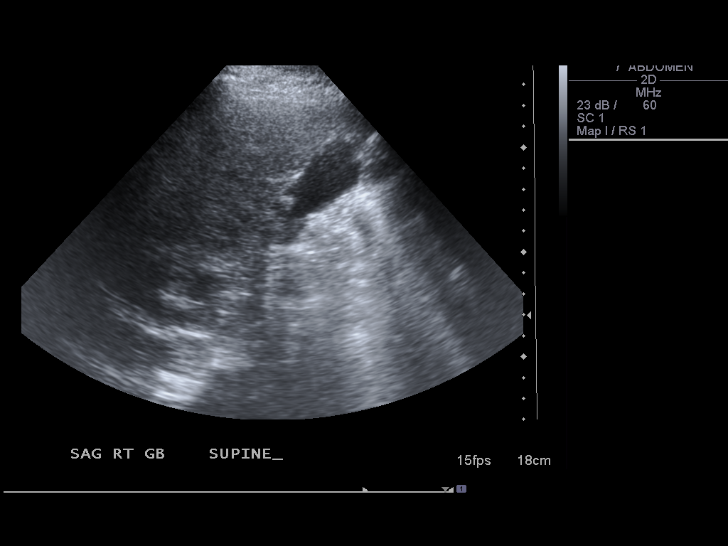
[im 31/92]
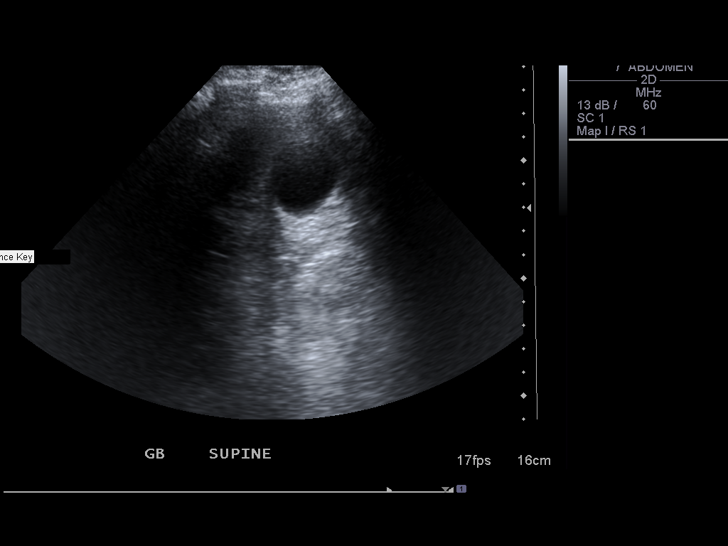
[im 35/92]
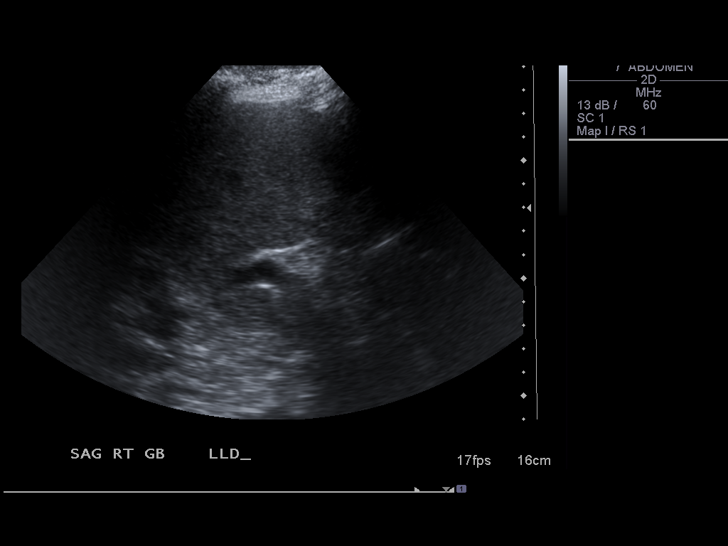
[im 42/92]
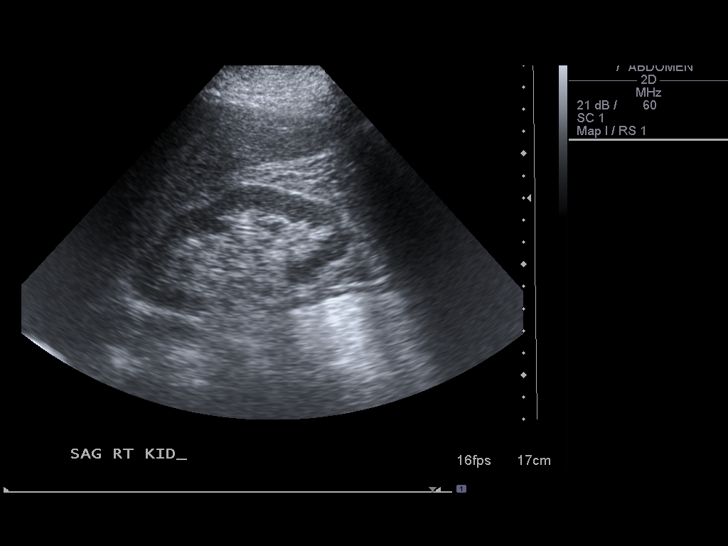
[im 50/92]
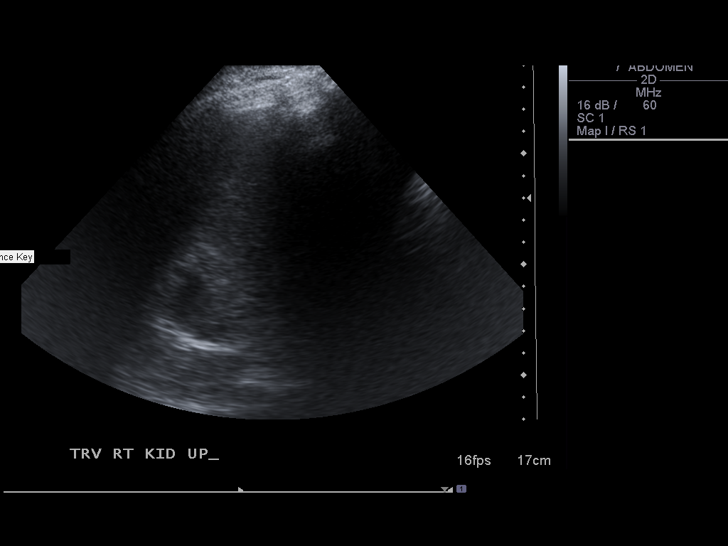
[im 57/92]
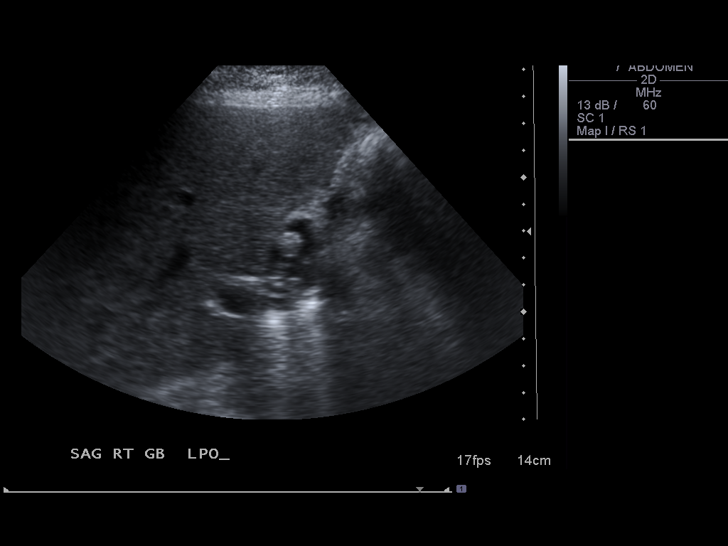
[im 61/92]
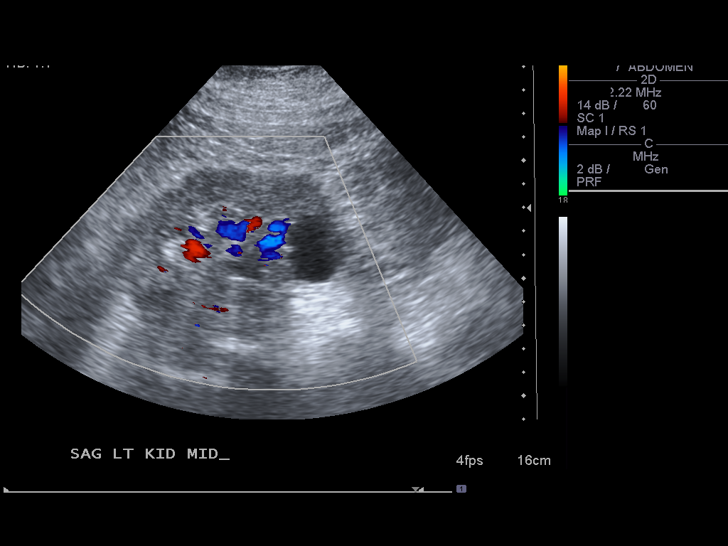
[im 69/92]
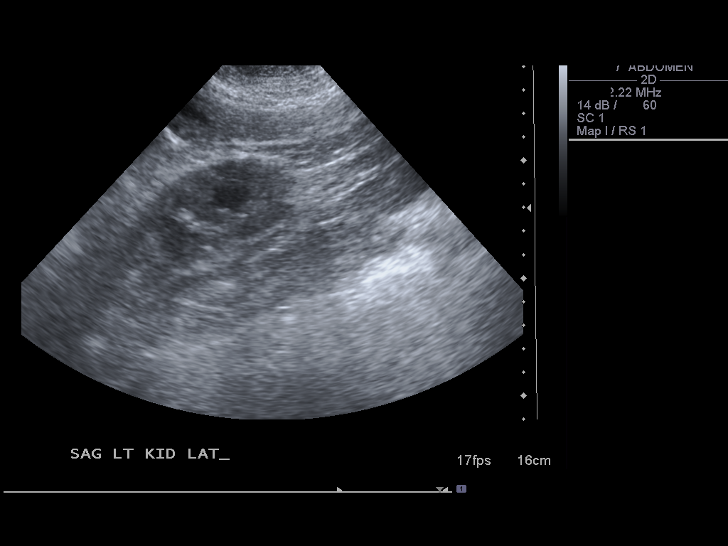
[im 76/92]
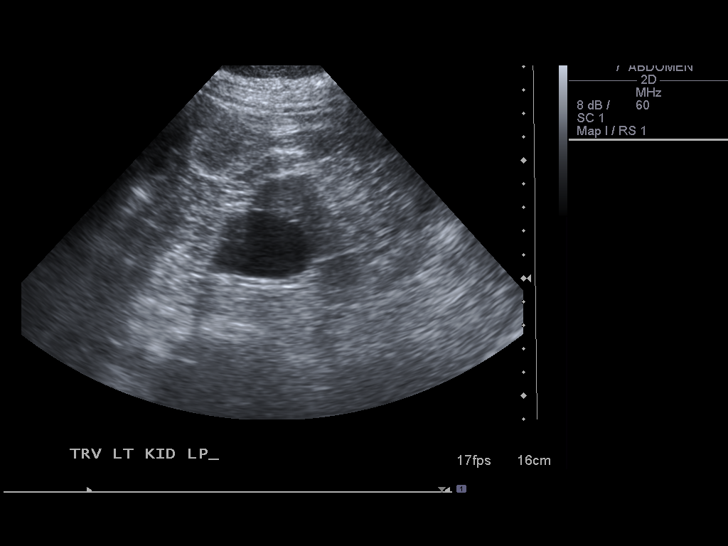
[im 84/92]
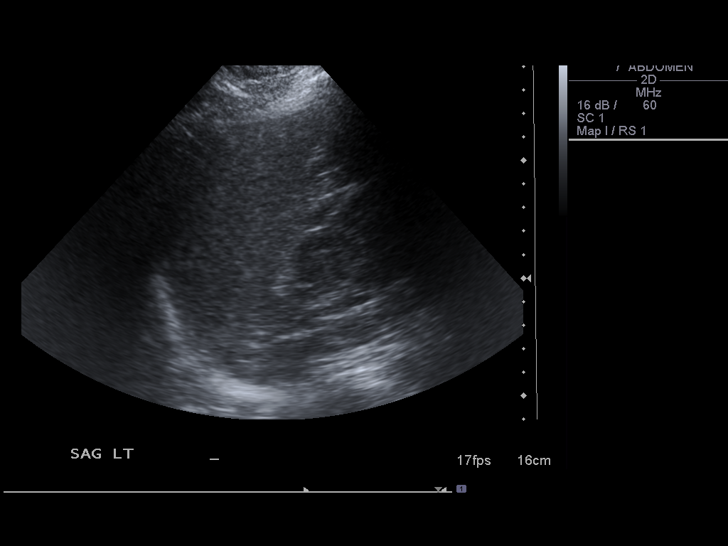
[im 92/92]
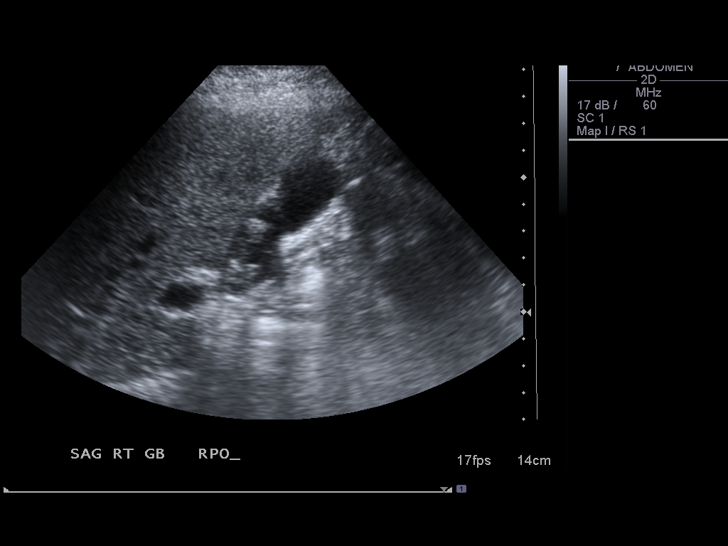

[14 of 25 positions shown; findings below may reference images not displayed]

FINDINGS: Gallbladder:  Shadowing stones measureup to 1.1 cm.  Gallbladder
wall measures 2 mm.  No sonographic Murphy's sign.

Common bile duct:  Measures 3 mm, within normal limits.

Liver:  Echotexture appears coarsened.

IVC:  Visualized.

Pancreas:  Poorly visualized due to bowel gas.

Spleen:  Measures 12.7 cm.

Right Kidney:  Measures 10.5 cm.  Contains a 2.7 x 2.6 x 1.7 cm
cyst in the upper pole.  No hydronephrosis.

Left Kidney:  Measures 11.9 cm with uniform parenchymal echo
texture.  A 2.8 x 3.0 x 4.0 cm cyst is noted.

Abdominal aorta:  Obscured by bowel gas.
IMPRESSION: 1.  Hepatic steatosis.  If there is concern for hepatic metastatic
disease, MR abdomen with without contrast is suggested.
2.  Cholelithiasis.

## 2013-01-01 ENCOUNTER — Encounter (HOSPITAL_COMMUNITY): Payer: Managed Care, Other (non HMO) | Attending: Oncology

## 2013-01-01 VITALS — BP 133/81 | HR 70

## 2013-01-01 DIAGNOSIS — C20 Malignant neoplasm of rectum: Secondary | ICD-10-CM

## 2013-01-01 DIAGNOSIS — C7800 Secondary malignant neoplasm of unspecified lung: Secondary | ICD-10-CM | POA: Insufficient documentation

## 2013-01-01 DIAGNOSIS — E538 Deficiency of other specified B group vitamins: Secondary | ICD-10-CM | POA: Insufficient documentation

## 2013-01-01 DIAGNOSIS — M25519 Pain in unspecified shoulder: Secondary | ICD-10-CM | POA: Insufficient documentation

## 2013-01-01 DIAGNOSIS — Z9889 Other specified postprocedural states: Secondary | ICD-10-CM | POA: Insufficient documentation

## 2013-01-01 DIAGNOSIS — Z95828 Presence of other vascular implants and grafts: Secondary | ICD-10-CM

## 2013-01-01 DIAGNOSIS — J90 Pleural effusion, not elsewhere classified: Secondary | ICD-10-CM | POA: Insufficient documentation

## 2013-01-01 DIAGNOSIS — C787 Secondary malignant neoplasm of liver and intrahepatic bile duct: Secondary | ICD-10-CM | POA: Insufficient documentation

## 2013-01-01 DIAGNOSIS — Z452 Encounter for adjustment and management of vascular access device: Secondary | ICD-10-CM

## 2013-01-01 MED ORDER — HEPARIN SOD (PORK) LOCK FLUSH 100 UNIT/ML IV SOLN
INTRAVENOUS | Status: AC
  Filled 2013-01-01: qty 5

## 2013-01-01 MED ORDER — CYANOCOBALAMIN 1000 MCG/ML IJ SOLN
INTRAMUSCULAR | Status: AC
  Filled 2013-01-01: qty 1

## 2013-01-01 MED ORDER — CYANOCOBALAMIN 1000 MCG/ML IJ SOLN
1000.0000 ug | Freq: Once | INTRAMUSCULAR | Status: AC
  Administered 2013-01-01: 1000 ug via INTRAMUSCULAR

## 2013-01-01 MED ORDER — SODIUM CHLORIDE 0.9 % IJ SOLN
10.0000 mL | INTRAMUSCULAR | Status: DC | PRN
  Administered 2013-01-01: 10 mL via INTRAVENOUS
  Filled 2013-01-01: qty 10

## 2013-01-01 MED ORDER — HEPARIN SOD (PORK) LOCK FLUSH 100 UNIT/ML IV SOLN
500.0000 [IU] | Freq: Once | INTRAVENOUS | Status: AC
  Administered 2013-01-01: 500 [IU] via INTRAVENOUS
  Filled 2013-01-01: qty 5

## 2013-01-01 NOTE — Progress Notes (Signed)
[name] presented for Portacath access and flush. Proper placement of portacath confirmed by CXR. Portacath located lt chest wall accessed with  H 20 needle. Good blood return present. Portacath flushed with 20ml NS and 500U/59ml Heparin and needle removed intact. Procedure without incident. Patient tolerated procedure well.  [name] presents today for injection per MD orders. B12  administered SQ in right Upper Arm. Administration without incident. Patient tolerated well.

## 2013-01-11 ENCOUNTER — Encounter (HOSPITAL_BASED_OUTPATIENT_CLINIC_OR_DEPARTMENT_OTHER): Payer: Managed Care, Other (non HMO)

## 2013-01-11 DIAGNOSIS — C7800 Secondary malignant neoplasm of unspecified lung: Secondary | ICD-10-CM

## 2013-01-11 DIAGNOSIS — C20 Malignant neoplasm of rectum: Secondary | ICD-10-CM

## 2013-01-11 DIAGNOSIS — C787 Secondary malignant neoplasm of liver and intrahepatic bile duct: Secondary | ICD-10-CM

## 2013-01-11 LAB — COMPREHENSIVE METABOLIC PANEL
ALT: 21 U/L (ref 0–53)
AST: 41 U/L — ABNORMAL HIGH (ref 0–37)
Alkaline Phosphatase: 129 U/L — ABNORMAL HIGH (ref 39–117)
CO2: 24 mEq/L (ref 19–32)
GFR calc Af Amer: 67 mL/min — ABNORMAL LOW (ref >90)
GFR calc non Af Amer: 57 mL/min — ABNORMAL LOW (ref >90)
Glucose, Bld: 112 mg/dL — ABNORMAL HIGH (ref 70–99)
Potassium: 3.6 mEq/L (ref 3.5–5.1)
Sodium: 140 mEq/L (ref 135–145)

## 2013-01-11 LAB — CBC WITH DIFFERENTIAL/PLATELET
Hemoglobin: 12.1 g/dL — ABNORMAL LOW (ref 13.0–17.0)
Lymphocytes Relative: 12 % (ref 12–46)
Lymphs Abs: 0.5 10*3/uL — ABNORMAL LOW (ref 0.7–4.0)
Monocytes Relative: 8 % (ref 3–12)
Neutro Abs: 3.3 10*3/uL (ref 1.7–7.7)
Neutrophils Relative %: 76 % (ref 43–77)
RBC: 3.77 MIL/uL — ABNORMAL LOW (ref 4.22–5.81)
WBC: 4.4 10*3/uL (ref 4.0–10.5)

## 2013-01-11 NOTE — Progress Notes (Signed)
[name] presented for [employer]. Labs per MD order drawn via Peripheral Line 23 gauge needle inserted in right AC  Good blood return present. Procedure without incident.  Needle removed intact. Patient tolerated procedure well.

## 2013-01-14 ENCOUNTER — Ambulatory Visit (HOSPITAL_COMMUNITY): Admit: 2013-01-14 | Payer: Managed Care, Other (non HMO)

## 2013-01-14 ENCOUNTER — Encounter (HOSPITAL_COMMUNITY): Payer: Self-pay

## 2013-01-14 ENCOUNTER — Ambulatory Visit (HOSPITAL_COMMUNITY)
Admit: 2013-01-14 | Discharge: 2013-01-14 | Disposition: A | Discharge status: Home / Self Care | Payer: Managed Care, Other (non HMO) | Attending: Oncology | Admitting: Oncology

## 2013-01-14 DIAGNOSIS — R161 Splenomegaly, not elsewhere classified: Secondary | ICD-10-CM | POA: Insufficient documentation

## 2013-01-14 DIAGNOSIS — N62 Hypertrophy of breast: Secondary | ICD-10-CM | POA: Insufficient documentation

## 2013-01-14 DIAGNOSIS — Z9089 Acquired absence of other organs: Secondary | ICD-10-CM | POA: Insufficient documentation

## 2013-01-14 DIAGNOSIS — N200 Calculus of kidney: Secondary | ICD-10-CM | POA: Insufficient documentation

## 2013-01-14 DIAGNOSIS — K449 Diaphragmatic hernia without obstruction or gangrene: Secondary | ICD-10-CM | POA: Insufficient documentation

## 2013-01-14 DIAGNOSIS — C20 Malignant neoplasm of rectum: Secondary | ICD-10-CM

## 2013-01-14 DIAGNOSIS — Z923 Personal history of irradiation: Secondary | ICD-10-CM | POA: Insufficient documentation

## 2013-01-14 DIAGNOSIS — IMO0002 Reserved for concepts with insufficient information to code with codable children: Secondary | ICD-10-CM | POA: Insufficient documentation

## 2013-01-14 DIAGNOSIS — L723 Sebaceous cyst: Secondary | ICD-10-CM | POA: Insufficient documentation

## 2013-01-14 DIAGNOSIS — K56609 Unspecified intestinal obstruction, unspecified as to partial versus complete obstruction: Secondary | ICD-10-CM | POA: Insufficient documentation

## 2013-01-14 DIAGNOSIS — C7800 Secondary malignant neoplasm of unspecified lung: Secondary | ICD-10-CM

## 2013-01-14 DIAGNOSIS — C3490 Malignant neoplasm of unspecified part of unspecified bronchus or lung: Secondary | ICD-10-CM | POA: Insufficient documentation

## 2013-01-14 DIAGNOSIS — J90 Pleural effusion, not elsewhere classified: Secondary | ICD-10-CM | POA: Insufficient documentation

## 2013-01-14 LAB — POCT I-STAT, CHEM 8
Calcium, Ion: 1.23 mmol/L (ref 1.12–1.23)
Chloride: 107 mEq/L (ref 96–112)
Creatinine, Ser: 1.4 mg/dL — ABNORMAL HIGH (ref 0.50–1.35)
Glucose, Bld: 83 mg/dL (ref 70–99)
HCT: 36 % — ABNORMAL LOW (ref 39.0–52.0)
Hemoglobin: 12.2 g/dL — ABNORMAL LOW (ref 13.0–17.0)

## 2013-01-14 MED ORDER — IOHEXOL 300 MG/ML  SOLN
100.0000 mL | Freq: Once | INTRAMUSCULAR | Status: AC | PRN
  Administered 2013-01-14: 100 mL via INTRAVENOUS

## 2013-01-16 ENCOUNTER — Encounter (HOSPITAL_BASED_OUTPATIENT_CLINIC_OR_DEPARTMENT_OTHER): Payer: Managed Care, Other (non HMO) | Admitting: Oncology

## 2013-01-16 ENCOUNTER — Encounter (HOSPITAL_COMMUNITY): Payer: Self-pay | Admitting: Oncology

## 2013-01-16 VITALS — BP 138/84 | HR 77 | Resp 18 | Wt 225.4 lb

## 2013-01-16 DIAGNOSIS — C20 Malignant neoplasm of rectum: Secondary | ICD-10-CM

## 2013-01-16 DIAGNOSIS — M25519 Pain in unspecified shoulder: Secondary | ICD-10-CM

## 2013-01-16 DIAGNOSIS — C7800 Secondary malignant neoplasm of unspecified lung: Secondary | ICD-10-CM

## 2013-01-16 DIAGNOSIS — C787 Secondary malignant neoplasm of liver and intrahepatic bile duct: Secondary | ICD-10-CM

## 2013-01-16 DIAGNOSIS — J90 Pleural effusion, not elsewhere classified: Secondary | ICD-10-CM

## 2013-01-16 DIAGNOSIS — M25511 Pain in right shoulder: Secondary | ICD-10-CM

## 2013-01-16 MED ORDER — HYDROCODONE-ACETAMINOPHEN 5-325 MG PO TABS: 1.0000 | ORAL_TABLET | Freq: Four times a day (QID) | ORAL | Status: DC | PRN

## 2013-01-16 NOTE — Patient Instructions (Addendum)
.  [facility] Discharge Instructions  RECOMMENDATIONS MADE BY THE CONSULTANT AND ANY TEST RESULTS WILL BE SENT TO YOUR REFERRING PHYSICIAN.  EXAM FINDINGS BY THE PHYSICIAN TODAY AND SIGNS OR SYMPTOMS TO REPORT TO CLINIC OR PRIMARY PHYSICIAN: Exam per Elijah Birk  MEDICATIONS PRESCRIBED:  Use hydrocodone and advil or aleve for your shoulder May use heating pad  INSTRUCTIONS GIVEN AND DISCUSSED: Chest x-ray in 1 month --- due 9/20 CT chest-abd-pelvis in 3 months-- we will give you prep for this prior to the scan Cbc diff, cmet, cea every 6 weeks with port flush  SPECIAL INSTRUCTIONS/FOLLOW-UP: Call us if shoulder does not improve in a week or 2.  Return to see Dr. After ct scans  Thank you for choosing [facility] to provide your oncology and hematology care.  To afford each patient quality time with our providers, please arrive at least 15 minutes before your scheduled appointment time.  With your help, our goal is to use those 15 minutes to complete the necessary work-up to ensure our physicians have the information they need to help with your evaluation and healthcare recommendations.    Effective January 1st, 2014, we ask that you re-schedule your appointment with our physicians should you arrive 10 or more minutes late for your appointment.  We strive to give you quality time with our providers, and arriving late affects you and other patients whose appointments are after yours.    Again, thank you for choosing [facility].  Our hope is that these requests will decrease the amount of time that you wait before being seen by our physicians.       _____________________________________________________________  Should you have questions after your visit to [facility], please contact our office at [phone] between the hours of 8:30 a.m. and 5:00 p.m.  Voicemails left after 4:30 p.m. will not be returned until the following business  day.  For prescription refill requests, have your pharmacy contact our office with your prescription refill request.

## 2013-01-16 NOTE — Progress Notes (Signed)
[name] Ribas, MD [address] [place] [address]  Adenocarcinoma of rectum  2.3 cm solitary lung metastasis from rectal cancer  CURRENT THERAPY: Surveillance  INTERVAL HISTORY: [name] 62 y.o. 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  He has been on drug-holiday since February of 2014 after completing 24 cycles of FOLFOX and Avastin.  His last PET/CT result in May of 2014 showed; " Persistent malignant range FDG uptake associated with the right upper lobe nodule. The degree of FDG uptake is not significantly improved from previous exam. The nodule in the right upper lobe demonstrates interval cavitation compared with previous exam. Decrease in hypermetabolism associated with multifocal liver metastasis. On today's study there is no abnormal uptake within the liver above background activity."  He underwent a CT scan on 01/14/2013.  I personally reviewed and went over radiographic studies with the patient. His CT scan is not impressive for any obvious recurrence and therefore we will continue with chemotherapy holiday.  He looks good and feels great.  He denies any rectal bleeding, changes in stool color, changes in stool caliber or frequency.  He denies any differing discharge from rectum.    He does make note of a right shoulder pain, that began after a right sided thorax discomfort.  The story is that he had a sneezing attack last week and subsequently he has a right sided thorax, chest discomfort.  That pain resolved a few days later, but then he noticed a right shoulder pain.  He rport thaat it only hurts with abduction of right arm and hit is tender on superior portion of shoulder. I suspect bursitis clinically.  I recommend plain films of right shoulder, but [name] wants to treat it symptomatically for 1 week and if not better he will call the clinic and I will set him up for xrays.    I provided the patient education regarding aforementioned results and I recommend continued chemotherapy holiday.  I would like to perform a chest xray in 4 weeks to make sue the pleural effusion, etiology unknown, is not growing, and if it is, I will perform a diagnostic thoracentesis.  Oncologically, he denies any complaints and ROS questioning is negative.     Past Medical History  Diagnosis Date  . Pancytopenia, acquired 12/04/2010  . B12 deficiency 12/07/2010  . Hypertension   . Diabetes mellitus   . Hypercholesteremia   . Kidney stones   . CAD (coronary artery disease)   . Anemia   . History of radiation therapy 06/19/07 -07/27/07    whole pelvis  . Hx of radiation therapy 08/06/12,08/09/12,& 08/13/12    rul [id number]/16fx  . Cancer     Colon ca dx 2008 surg/rad/chemo  . Lung cancer     has DIABETES MELLITUS, TYPE II; HYPERCHOLESTEROLEMIA; ANEMIA, CHRONIC; CORONARY ARTERY DISEASE; SLEEP APNEA; Adenocarcinoma of rectum; Pancytopenia, acquired; B12 deficiency; History of radiation therapy; 2.3 cm solitary lung metastasis from rectal cancer; Port catheter in place; and Hx of radiation therapy on his problem list.     is allergic to daypro.  [name] does not currently have medications on file.  Past Surgical History  Procedure Laterality Date  . Ileostomy  12/07/2010    Procedure: ILEOSTOMY;  Surgeon: [name];  Location: [facility];  Service: General;  Laterality: N/A;  Diverting Ileostomy Lysis of adhesions Exploratory Laparotomy  . Lung biopsy  10/27/10    rt lobe- adenocarcinoma  . Cholecystectomy    . Cataract extraction w/ intraocular lens implant  2007    bil  . Lithotripsy  2002  . Coronary angioplasty with stent placement  2003    stenting x 2  . Colon resection  2008    Denies any headaches, dizziness, double vision, fevers, chills, night sweats, nausea, vomiting, diarrhea, constipation, heart palpitations, shortness of breath, blood in stool, black tarry stool,  urinary pain, urinary burning, urinary frequency, hematuria.   PHYSICAL EXAMINATION  ECOG PERFORMANCE STATUS: 1 - Symptomatic but completely ambulatory  Filed Vitals:   01/16/13 1027  BP: 138/84  Pulse: 77  Resp: 18    GENERAL:alert, no distress, well nourished, well developed, comfortable, cooperative, obese and smiling SKIN: skin color, texture, turgor are normal, no rashes or significant lesions HEAD: Normocephalic, No masses, lesions, tenderness or abnormalities EYES: normal, PERRLA, EOMI, Conjunctiva are pink and non-injected EARS: External ears normal OROPHARYNX:mucous membranes are moist  NECK: supple, no adenopathy, thyroid normal size, non-tender, without nodularity, no stridor, non-tender, trachea midline LYMPH:  no palpable lymphadenopathy BREAST:not examined LUNGS: clear to auscultation and percussion, with no breath sounds in right lower lobe and dullness to percussion. HEART: regular rate & rhythm, no murmurs, no gallops, S1 normal and S2 normal ABDOMEN:abdomen soft, non-tender, obese, normal bowel sounds and colostomy producing stool appropriately. BACK: Back symmetric, no curvature., No CVA tenderness, right upper back lesion/cyst stable and chronically there x many years. EXTREMITIES:less then 2 second capillary refill, no joint deformities, effusion, or inflammation, no edema, no skin discoloration, no clubbing, no cyanosis  NEURO: alert & oriented x 3 with fluent speech, no focal motor/sensory deficits, gait normal    LABORATORY DATA: CBC    Component Value Date/Time   WBC 4.4 01/11/2013 1128   RBC 3.77* 01/11/2013 1128   HGB 12.2* 01/14/2013 1339   HCT 36.0* 01/14/2013 1339   PLT 54* 01/11/2013 1128   MCV 95.8 01/11/2013 1128   MCH 32.1 01/11/2013 1128   MCHC 33.5 01/11/2013 1128   RDW 14.8 01/11/2013 1128   LYMPHSABS 0.5* 01/11/2013 1128   MONOABS 0.3 01/11/2013 1128   EOSABS 0.2 01/11/2013 1128   BASOSABS 0.0 01/11/2013 1128      Chemistry      Component  Value Date/Time   NA 141 01/14/2013 1339   K 3.9 01/14/2013 1339   CL 107 01/14/2013 1339   CO2 24 01/11/2013 1128   BUN 5* 01/14/2013 1339   CREATININE 1.40* 01/14/2013 1339      Component Value Date/Time   CALCIUM 9.5 01/11/2013 1128   ALKPHOS 129* 01/11/2013 1128   AST 41* 01/11/2013 1128   ALT 21 01/11/2013 1128   BILITOT 1.8* 01/11/2013 1128     Lab Results  Component Value Date   CEA 1.1 01/11/2013     RADIOGRAPHIC STUDIES:  Ct Chest W Contrast  01/14/2013   *RADIOLOGY REPORT*  Clinical Data:  Patient with history of rectal cancer and lung cancer.  CT CHEST, ABDOMEN AND PELVIS WITH CONTRAST  Technique:  Multidetector CT imaging of the chest, abdomen and pelvis was performed following the standard protocol during bolus administration of intravenous contrast.  Contrast: OMNIPAQUE IOHEXOL 300 MG/ML  SOLN  Comparison:  Head CT Oct 09, 2012; CT CAP 07/16/2012;  CT CHEST  Findings:  Left anterior chest wall Port-A-Cath is present with tip terminating at the superior cavoatrial junction.  Bilateral gynecomastia.  Visualized thyroid and neck base is  unremarkable. Normal heart size.  No pericardial effusion.  Small hiatal hernia. Small sebaceous cyst overlying the right aspect of the back.  The central airways are patent.  Increased irregular consolidative opacity within the right upper lobe with associated volume loss. Previously described right upper lobe centrally cavitary nodule now is difficult to measure however measures approximately 2.5 x 1.9 cm on the coronal images, previously 1.7 x 1.6 cm.  There has been interval increase in size of now moderate right pleural effusion which extends into the right minor fissure, adjacent to the right upper lobe pulmonary lesion.  No pneumothorax.  No new pulmonary nodules or masses identified.  No aggressive appearing osseous lesions.  IMPRESSION: 1. Increased irregular consolidation within the right upper lobe with associated volume loss which may be  sequelae of radiation therapy.  The previously described right upper lobe mass is somewhat irregular in appearance however appears slightly increased in size when compared on the coronal images.  Findings are nonspecific and may be sequelae of radiation therapy.  Residual and/or worsening disease is not excluded, attention on follow-up is recommended.  2. Interval increase in size of now moderate right pleural effusion  CT ABDOMEN AND PELVIS  Findings:  Previously described hepatic lesions on prior PET CT evaluation are not well demonstrated on current examination. Status post cholecystectomy.  Portal vein is patent.  The spleen is enlarged measuring up to 15 cm with unchanged peripheral wedge shaped low attenuation region, likely sequela of splenic infarct. Bilateral adrenal glands are unremarkable.  Multiple left sided renal stones are grossly stable when compared to recent prior exam.  There is a new non-obstructing 3- 4 mm stone within the inferior pole of the right kidney.  Normal caliber abdominal aorta.  Urinary bladder is decompressed. Central dystrophic calcifications within the prostate.  There has been slight interval increase in circumferential wall thickening of the rectum.  Slight interval increase in perirectal fat stranding. Small bowel containing parastomal hernia.  Lower lumbar spine degenerative change.  No aggressive appearing osseous lesions.  IMPRESSION: 1.  No definite hepatic lesion identified on CT evaluation to correspond with previously described hepatic lesions on prior PET CT imaging.  2.  Interval increase in circumferential rectal wall thickening and perirectal fat stranding.  While this is nonspecific and may be sequelae of radiation therapy, worsening disease is not excluded.  3.  Right lower quadrant ostomy and parastomal hernia containing non-obstructed small bowel.  4. Bilateral nephrolithiasis with new 3-4 mm right renal stone.   Original Report Authenticated By: Annia Belt, M.D     Ct Abdomen Pelvis W Contrast  01/14/2013   *RADIOLOGY REPORT*  Clinical Data:  Patient with history of rectal cancer and lung cancer.  CT CHEST, ABDOMEN AND PELVIS WITH CONTRAST  Technique:  Multidetector CT imaging of the chest, abdomen and pelvis was performed following the standard protocol during bolus administration of intravenous contrast.  Contrast: OMNIPAQUE IOHEXOL 300 MG/ML  SOLN  Comparison:  Head CT Oct 09, 2012; CT CAP 07/16/2012;  CT CHEST  Findings:  Left anterior chest wall Port-A-Cath is present with tip terminating at the superior cavoatrial junction.  Bilateral gynecomastia.  Visualized thyroid and neck base is unremarkable. Normal heart size.  No pericardial effusion.  Small hiatal hernia. Small sebaceous cyst overlying the right aspect of the back.  The central airways are patent.  Increased irregular consolidative opacity within the right upper lobe with associated volume loss. Previously described right upper lobe centrally cavitary nodule now  is difficult to measure however measures approximately 2.5 x 1.9 cm on the coronal images, previously 1.7 x 1.6 cm.  There has been interval increase in size of now moderate right pleural effusion which extends into the right minor fissure, adjacent to the right upper lobe pulmonary lesion.  No pneumothorax.  No new pulmonary nodules or masses identified.  No aggressive appearing osseous lesions.  IMPRESSION: 1. Increased irregular consolidation within the right upper lobe with associated volume loss which may be sequelae of radiation therapy.  The previously described right upper lobe mass is somewhat irregular in appearance however appears slightly increased in size when compared on the coronal images.  Findings are nonspecific and may be sequelae of radiation therapy.  Residual and/or worsening disease is not excluded, attention on follow-up is recommended.  2. Interval increase in size of now moderate right pleural effusion  CT ABDOMEN AND  PELVIS  Findings:  Previously described hepatic lesions on prior PET CT evaluation are not well demonstrated on current examination. Status post cholecystectomy.  Portal vein is patent.  The spleen is enlarged measuring up to 15 cm with unchanged peripheral wedge shaped low attenuation region, likely sequela of splenic infarct. Bilateral adrenal glands are unremarkable.  Multiple left sided renal stones are grossly stable when compared to recent prior exam.  There is a new non-obstructing 3- 4 mm stone within the inferior pole of the right kidney.  Normal caliber abdominal aorta.  Urinary bladder is decompressed. Central dystrophic calcifications within the prostate.  There has been slight interval increase in circumferential wall thickening of the rectum.  Slight interval increase in perirectal fat stranding. Small bowel containing parastomal hernia.  Lower lumbar spine degenerative change.  No aggressive appearing osseous lesions.  IMPRESSION: 1.  No definite hepatic lesion identified on CT evaluation to correspond with previously described hepatic lesions on prior PET CT imaging.  2.  Interval increase in circumferential rectal wall thickening and perirectal fat stranding.  While this is nonspecific and may be sequelae of radiation therapy, worsening disease is not excluded.  3.  Right lower quadrant ostomy and parastomal hernia containing non-obstructed small bowel.  4. Bilateral nephrolithiasis with new 3-4 mm right renal stone.   Original Report Authenticated By: Annia Belt, M.D      ASSESSMENT:  1. 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  S/P 24 treatments of FOLFOX + Avastin, on chemotherapy holiday since Feb 2014.  CT scan most recently on 01/14/2013 does not show any clear evidence of disease progression and will continue with chemotherapy holiday 2. Right shoulder pain, consider bursitis but metastatic disease remains of differential. 3. Right pleural effusion, moderate in size, etiology  unknown.  Patient Active Problem List   Diagnosis Date Noted  . Hx of radiation therapy   . Port catheter in place 09/04/2012  . 2.3 cm solitary lung metastasis from rectal cancer 07/23/2012  . History of radiation therapy   . B12 deficiency 12/07/2010  . Pancytopenia, acquired 12/04/2010  . DIABETES MELLITUS, TYPE II 07/07/2010  . HYPERCHOLESTEROLEMIA 07/07/2010  . ANEMIA, CHRONIC 07/07/2010  . CORONARY ARTERY DISEASE 07/07/2010  . SLEEP APNEA 07/07/2010  . Adenocarcinoma of rectum 07/07/2010     PLAN:  1. I personally reviewed and went over laboratory results with the patient. 2. I personally reviewed and went over radiographic studies with the patient. 3. Labs every 6 weeks with port flushes: CBC diff, CMET, CEA.  4. Restaging CT of CAP with contrast in 3 months.  5.  Will order Xray of right shoulder if shoulder pain continues.  Patient will call the clinic in 1 week or sooner if necessary.  6. Rx refill for Hydrocodone 5/325 #45 7. Chest Xray in 1 month for follow-up on right pleural effusion. If worse, will order diagnostic thoracentesis. 8. Return in 3 months for follow-up.   THERAPY PLAN:  Due to recent CT scans, which does not clearly identify progression of disease, will continue with chemotherapy holiday.  CT scan did reveal a right, moderate sized, pleural effusion.  Etiology is unknown.  Will follow-up with Chest Xray in 4 weeks.  If worse, will order a diagnostic thoracentesis. Will continue labs with each port flush.  Will restage in 3 months with CT CAP with contrast and re-consider treatment options at that time.   All questions were answered. The patient knows to call the clinic with any problems, questions or concerns. We can certainly see the patient much sooner if necessary.  Patient and plan discussed with Dr. Erline Hau and he is in agreement with the aforementioned.   KEFALAS,THOMAS

## 2013-02-08 ENCOUNTER — Encounter (HOSPITAL_COMMUNITY): Payer: Managed Care, Other (non HMO) | Attending: Oncology

## 2013-02-08 VITALS — BP 131/73 | HR 68

## 2013-02-08 DIAGNOSIS — Z9889 Other specified postprocedural states: Secondary | ICD-10-CM | POA: Insufficient documentation

## 2013-02-08 DIAGNOSIS — E538 Deficiency of other specified B group vitamins: Secondary | ICD-10-CM | POA: Insufficient documentation

## 2013-02-08 DIAGNOSIS — C20 Malignant neoplasm of rectum: Secondary | ICD-10-CM | POA: Insufficient documentation

## 2013-02-08 MED ORDER — CYANOCOBALAMIN 1000 MCG/ML IJ SOLN
1000.0000 ug | Freq: Once | INTRAMUSCULAR | Status: AC
  Administered 2013-02-08: 1000 ug via INTRAMUSCULAR

## 2013-02-08 MED ORDER — CYANOCOBALAMIN 1000 MCG/ML IJ SOLN
INTRAMUSCULAR | Status: AC
  Filled 2013-02-08: qty 1

## 2013-02-08 NOTE — Progress Notes (Signed)
Vitamin b-12 1000 mcg given im z-track to right deltoid muscle.  Tolerated well.

## 2013-02-22 ENCOUNTER — Encounter (HOSPITAL_BASED_OUTPATIENT_CLINIC_OR_DEPARTMENT_OTHER): Payer: Managed Care, Other (non HMO)

## 2013-02-22 ENCOUNTER — Ambulatory Visit (HOSPITAL_COMMUNITY)
Admission: RE | Admit: 2013-02-22 | Discharge: 2013-02-22 | Disposition: A | Discharge status: Home / Self Care | Payer: Managed Care, Other (non HMO) | Source: Ambulatory Visit | Attending: Oncology | Admitting: Oncology

## 2013-02-22 DIAGNOSIS — C20 Malignant neoplasm of rectum: Secondary | ICD-10-CM

## 2013-02-22 DIAGNOSIS — Z452 Encounter for adjustment and management of vascular access device: Secondary | ICD-10-CM

## 2013-02-22 DIAGNOSIS — Z95828 Presence of other vascular implants and grafts: Secondary | ICD-10-CM

## 2013-02-22 DIAGNOSIS — Z923 Personal history of irradiation: Secondary | ICD-10-CM | POA: Insufficient documentation

## 2013-02-22 DIAGNOSIS — Z85118 Personal history of other malignant neoplasm of bronchus and lung: Secondary | ICD-10-CM | POA: Insufficient documentation

## 2013-02-22 DIAGNOSIS — J90 Pleural effusion, not elsewhere classified: Secondary | ICD-10-CM | POA: Insufficient documentation

## 2013-02-22 LAB — COMPREHENSIVE METABOLIC PANEL
ALT: 21 U/L (ref 0–53)
AST: 43 U/L — ABNORMAL HIGH (ref 0–37)
Alkaline Phosphatase: 120 U/L — ABNORMAL HIGH (ref 39–117)
Calcium: 9.7 mg/dL (ref 8.4–10.5)
GFR calc Af Amer: 56 mL/min — ABNORMAL LOW (ref >90)
Potassium: 3.5 mEq/L (ref 3.5–5.1)
Sodium: 140 mEq/L (ref 135–145)
Total Protein: 7.9 g/dL (ref 6.0–8.3)

## 2013-02-22 LAB — CBC WITH DIFFERENTIAL/PLATELET
Basophils Absolute: 0 10*3/uL (ref 0.0–0.1)
Eosinophils Absolute: 0.1 10*3/uL (ref 0.0–0.7)
Eosinophils Relative: 3 % (ref 0–5)
HCT: 37.2 % — ABNORMAL LOW (ref 39.0–52.0)
Lymphocytes Relative: 12 % (ref 12–46)
Lymphs Abs: 0.6 10*3/uL — ABNORMAL LOW (ref 0.7–4.0)
MCV: 93 fL (ref 78.0–100.0)
Neutrophils Relative %: 75 % (ref 43–77)
Platelets: 53 10*3/uL — ABNORMAL LOW (ref 150–400)
RBC: 4 MIL/uL — ABNORMAL LOW (ref 4.22–5.81)
RDW: 14.9 % (ref 11.5–15.5)
WBC: 4.8 10*3/uL (ref 4.0–10.5)

## 2013-02-22 MED ORDER — HEPARIN SOD (PORK) LOCK FLUSH 100 UNIT/ML IV SOLN
INTRAVENOUS | Status: AC
  Filled 2013-02-22: qty 5

## 2013-02-22 MED ORDER — HEPARIN SOD (PORK) LOCK FLUSH 100 UNIT/ML IV SOLN
500.0000 [IU] | Freq: Once | INTRAVENOUS | Status: AC
  Administered 2013-02-22: 500 [IU] via INTRAVENOUS

## 2013-02-22 MED ORDER — SODIUM CHLORIDE 0.9 % IJ SOLN
10.0000 mL | INTRAMUSCULAR | Status: DC | PRN
  Administered 2013-02-22: 10 mL via INTRAVENOUS

## 2013-02-22 NOTE — Progress Notes (Signed)
[name] presented for Portacath access and flush. Proper placement of portacath confirmed by CXR. Portacath located left chest wall accessed with  H 20 needle. Good blood return present. Portacath flushed with 20ml NS and 500U/53ml Heparin and needle removed intact. Procedure without incident. Patient tolerated procedure well.

## 2013-02-26 ENCOUNTER — Other Ambulatory Visit (HOSPITAL_COMMUNITY): Payer: Self-pay | Admitting: Oncology

## 2013-02-26 DIAGNOSIS — C20 Malignant neoplasm of rectum: Secondary | ICD-10-CM

## 2013-02-27 ENCOUNTER — Other Ambulatory Visit (HOSPITAL_COMMUNITY): Payer: Managed Care, Other (non HMO)

## 2013-03-07 ENCOUNTER — Encounter: Payer: Self-pay | Admitting: Radiation Oncology

## 2013-03-07 ENCOUNTER — Ambulatory Visit
Admission: RE | Admit: 2013-03-07 | Discharge: 2013-03-07 | Disposition: A | Discharge status: Home / Self Care | Payer: Managed Care, Other (non HMO) | Source: Ambulatory Visit | Attending: Radiation Oncology | Admitting: Radiation Oncology

## 2013-03-07 VITALS — BP 131/85 | HR 68 | Temp 98.5°F | Resp 20 | Wt 219.5 lb

## 2013-03-07 DIAGNOSIS — C7800 Secondary malignant neoplasm of unspecified lung: Secondary | ICD-10-CM

## 2013-03-07 NOTE — Progress Notes (Signed)
Radiation Oncology         [phone]) [phone] ________________________________  Name: [name] MRN: [id number]  Date: 03/07/2013  DOB: [birth year]  Follow-Up Visit Note  CC: [name], MD  Randall An, MD  Diagnosis:   62 year old gentleman with a 2.3 cm solitary lung metastasis and liver metastases from rectal cancer - Stage IV  Interval Since Last Radiation:  6  months  Narrative:  The patient returns today for routine follow-up.  Pt denies pain, fatigue, loss of appetite, SOB. He states he has a cold and is coughing up phlegm. He is taking OTC Delsym and a "cold medication." He states his colostomy is working well but states he has skin irritation around the seal. He states he is using new bags which can stay on longer between changes, but he can't apply a salve because the bag will not stick if he does. Advised he call the supplier and ask for their advice re: skin irritation. Wife stated she may begin to change bag more often                               ALLERGIES:  is allergic to daypro.  Meds: Current Outpatient Prescriptions  Medication Sig Dispense Refill  . Acetaminophen (TYLENOL ARTHRITIS PAIN PO) Take 1,200 mg by mouth daily as needed. As needed for arthritic pain      . cyanocobalamin (,VITAMIN B-12,) 1000 MCG/ML injection Inject 1,000 mcg into the muscle every 30 (thirty) days.       Marland Kitchen HYDROcodone-acetaminophen (NORCO/VICODIN) 5-325 MG per tablet Take 1 tablet by mouth every 6 (six) hours as needed for pain.  45 tablet  0  . lidocaine-prilocaine (EMLA) cream Apply 1 application topically as needed. Apply quarter sized amount over port-a-cath site one hour prior to chemotherapy appointment.      Marland Kitchen LORazepam (ATIVAN) 1 MG tablet TAKE ONE TABLET BY MOUTH EVERY 4 HOURS AS NEEDED FOR NAUSEA AND VOMITING  30 tablet  1   No current facility-administered medications for this encounter.   Facility-Administered Medications Ordered in Other Encounters  Medication  Dose Route Frequency Provider Last Rate Last Dose  . 0.9 %  sodium chloride infusion   Intravenous Continuous Randall An, MD 20 mL/hr at 07/17/12 1113    . heparin lock flush 100 unit/mL  500 Units Intravenous Once Randall An, MD      . sodium chloride 0.9 % injection 10 mL  10 mL Intravenous PRN Randall An, MD   10 mL at 07/17/12 1113    Physical Findings: The patient is in no acute distress. Patient is alert and oriented.  weight is 219 lb 8 oz (99.565 kg). His oral temperature is 98.5 F (36.9 C). His blood pressure is 131/85 and his pulse is 68. His respiration is 20 and oxygen saturation is 98%. .  No significant changes.  Lab Findings: Lab Results  Component Value Date   WBC 4.8 02/22/2013   HGB 12.4* 02/22/2013   HCT 37.2* 02/22/2013   MCV 93.0 02/22/2013   PLT 53* 02/22/2013    @LASTCHEM @  Radiographic Findings: Dg Chest 2 View  02/22/2013   CLINICAL DATA:  Evaluate pleural effusion, history of lung cancer and radiation therapy  EXAM: CHEST  2 VIEW  COMPARISON:  01/14/2013  FINDINGS: Cardiomediastinal silhouette is stable. Elongated consolidation in right upper lobe is stable in size in appearance from prior CT scan.  No right pleural effusion. Stable mild degenerative changes thoracic spine. Left subclavian Port-A-Cath with tip in SVC. No pulmonary edema.  IMPRESSION: Stable elongated consolidation in right upper lobe. No pleural effusion. A left subclavian Port-A-Cath in place.   Electronically Signed   By: Natasha Mead   On: 02/22/2013 15:16    Impression:  The patient is recovering from the effects of radiation.  He has no overt evidence of recurrence, but, he does have some mass-like fibrosis at the treatment site in the right upper lung warranting careful radiographic follow-up.  Plan:  CT chest in 3-6 months. Follow-up in 6 months.  _____________________________________  Artist Pais. Kathrynn Running, M.D.

## 2013-03-07 NOTE — Progress Notes (Signed)
Pt denies pain, fatigue, loss of appetite, SOB. He states he has a cold and is coughing up phlegm. He is taking OTC Delsym and a "cold medication." He states his colostomy is working well but states he has skin irritation around the seal. He states he is using new bags which can stay on longer between changes, but he can't apply a salve because the bag will not stick if he does. Advised he call the supplier and ask for their advice re: skin irritation. Wife stated she may begin to change bag more often.

## 2013-03-08 ENCOUNTER — Encounter (HOSPITAL_COMMUNITY): Payer: Managed Care, Other (non HMO) | Attending: Oncology

## 2013-03-08 DIAGNOSIS — E538 Deficiency of other specified B group vitamins: Secondary | ICD-10-CM | POA: Insufficient documentation

## 2013-03-08 DIAGNOSIS — C20 Malignant neoplasm of rectum: Secondary | ICD-10-CM | POA: Insufficient documentation

## 2013-03-08 MED ORDER — CYANOCOBALAMIN 1000 MCG/ML IJ SOLN
1000.0000 ug | Freq: Once | INTRAMUSCULAR | Status: AC
  Administered 2013-03-08: 1000 ug via INTRAMUSCULAR

## 2013-03-08 MED ORDER — CYANOCOBALAMIN 1000 MCG/ML IJ SOLN
INTRAMUSCULAR | Status: AC
  Filled 2013-03-08: qty 1

## 2013-03-08 NOTE — Progress Notes (Signed)
[name] presents today for injection per MD orders. B12 1000 mcg administered IM in right Upper Arm. Administration without incident. Patient tolerated well.

## 2013-03-14 ENCOUNTER — Ambulatory Visit: Payer: Managed Care, Other (non HMO) | Admitting: Radiation Oncology

## 2013-04-05 ENCOUNTER — Encounter (HOSPITAL_COMMUNITY): Payer: Managed Care, Other (non HMO) | Attending: Oncology

## 2013-04-05 ENCOUNTER — Encounter (HOSPITAL_COMMUNITY): Payer: Managed Care, Other (non HMO)

## 2013-04-05 DIAGNOSIS — T148XXA Other injury of unspecified body region, initial encounter: Secondary | ICD-10-CM | POA: Insufficient documentation

## 2013-04-05 DIAGNOSIS — E538 Deficiency of other specified B group vitamins: Secondary | ICD-10-CM

## 2013-04-05 DIAGNOSIS — C20 Malignant neoplasm of rectum: Secondary | ICD-10-CM

## 2013-04-05 DIAGNOSIS — M25519 Pain in unspecified shoulder: Secondary | ICD-10-CM | POA: Insufficient documentation

## 2013-04-05 DIAGNOSIS — G4700 Insomnia, unspecified: Secondary | ICD-10-CM | POA: Insufficient documentation

## 2013-04-05 LAB — CBC WITH DIFFERENTIAL/PLATELET
Basophils Absolute: 0 10*3/uL (ref 0.0–0.1)
Basophils Relative: 0 % (ref 0–1)
Hemoglobin: 10.7 g/dL — ABNORMAL LOW (ref 13.0–17.0)
MCHC: 33.1 g/dL (ref 30.0–36.0)
Monocytes Relative: 10 % (ref 3–12)
Neutro Abs: 2.6 10*3/uL (ref 1.7–7.7)
Neutrophils Relative %: 74 % (ref 43–77)
RBC: 3.44 MIL/uL — ABNORMAL LOW (ref 4.22–5.81)
RDW: 14.9 % (ref 11.5–15.5)

## 2013-04-05 LAB — COMPREHENSIVE METABOLIC PANEL
AST: 26 U/L (ref 0–37)
Albumin: 2.9 g/dL — ABNORMAL LOW (ref 3.5–5.2)
Alkaline Phosphatase: 107 U/L (ref 39–117)
BUN: 9 mg/dL (ref 6–23)
CO2: 24 mEq/L (ref 19–32)
Chloride: 110 mEq/L (ref 96–112)
Creatinine, Ser: 1.38 mg/dL — ABNORMAL HIGH (ref 0.50–1.35)
GFR calc Af Amer: 63 mL/min — ABNORMAL LOW (ref >90)
Potassium: 3.6 mEq/L (ref 3.5–5.1)
Sodium: 142 mEq/L (ref 135–145)
Total Bilirubin: 1.3 mg/dL — ABNORMAL HIGH (ref 0.3–1.2)
Total Protein: 6.3 g/dL (ref 6.0–8.3)

## 2013-04-05 MED ORDER — CYANOCOBALAMIN 1000 MCG/ML IJ SOLN
1000.0000 ug | Freq: Once | INTRAMUSCULAR | Status: AC
  Administered 2013-04-05: 1000 ug via INTRAMUSCULAR

## 2013-04-05 MED ORDER — SODIUM CHLORIDE 0.9 % IJ SOLN
10.0000 mL | INTRAMUSCULAR | Status: DC | PRN
  Administered 2013-04-05: 10 mL via INTRAVENOUS

## 2013-04-05 MED ORDER — HEPARIN SOD (PORK) LOCK FLUSH 100 UNIT/ML IV SOLN
500.0000 [IU] | Freq: Once | INTRAVENOUS | Status: AC
  Administered 2013-04-05: 500 [IU] via INTRAVENOUS
  Filled 2013-04-05: qty 5

## 2013-04-05 MED ORDER — CYANOCOBALAMIN 1000 MCG/ML IJ SOLN
INTRAMUSCULAR | Status: AC
  Filled 2013-04-05: qty 1

## 2013-04-05 NOTE — Progress Notes (Signed)
See port flush appt for documentation and charges.

## 2013-04-05 NOTE — Progress Notes (Signed)
[name] presented for Portacath access and flush. Portacath located lt chest wall accessed with  H 20 needle. Good blood return present. Portacath flushed with 20ml NS and 500U/47ml Heparin and needle removed intact. Procedure without incident. Patient tolerated procedure well. [name] presents today for injection per MD orders. B12 1000 mcg administered IM in left Upper Arm. Administration without incident. Patient tolerated well.

## 2013-04-06 LAB — CEA: CEA: 3 ng/mL (ref 0.0–5.0)

## 2013-04-10 ENCOUNTER — Other Ambulatory Visit (HOSPITAL_COMMUNITY): Payer: Managed Care, Other (non HMO)

## 2013-04-18 ENCOUNTER — Ambulatory Visit (HOSPITAL_COMMUNITY)
Admit: 2013-04-18 | Discharge: 2013-04-18 | Disposition: A | Discharge status: Home / Self Care | Payer: Managed Care, Other (non HMO) | Attending: Oncology | Admitting: Oncology

## 2013-04-18 DIAGNOSIS — IMO0002 Reserved for concepts with insufficient information to code with codable children: Secondary | ICD-10-CM | POA: Insufficient documentation

## 2013-04-18 DIAGNOSIS — C20 Malignant neoplasm of rectum: Secondary | ICD-10-CM

## 2013-04-18 DIAGNOSIS — Z9089 Acquired absence of other organs: Secondary | ICD-10-CM | POA: Insufficient documentation

## 2013-04-18 DIAGNOSIS — I517 Cardiomegaly: Secondary | ICD-10-CM | POA: Insufficient documentation

## 2013-04-18 DIAGNOSIS — M439 Deforming dorsopathy, unspecified: Secondary | ICD-10-CM | POA: Insufficient documentation

## 2013-04-18 DIAGNOSIS — R161 Splenomegaly, not elsewhere classified: Secondary | ICD-10-CM | POA: Insufficient documentation

## 2013-04-18 DIAGNOSIS — J90 Pleural effusion, not elsewhere classified: Secondary | ICD-10-CM | POA: Insufficient documentation

## 2013-04-18 DIAGNOSIS — Z932 Ileostomy status: Secondary | ICD-10-CM | POA: Insufficient documentation

## 2013-04-18 DIAGNOSIS — R911 Solitary pulmonary nodule: Secondary | ICD-10-CM | POA: Insufficient documentation

## 2013-04-18 DIAGNOSIS — N281 Cyst of kidney, acquired: Secondary | ICD-10-CM | POA: Insufficient documentation

## 2013-04-18 DIAGNOSIS — M47817 Spondylosis without myelopathy or radiculopathy, lumbosacral region: Secondary | ICD-10-CM | POA: Insufficient documentation

## 2013-04-18 DIAGNOSIS — Z9049 Acquired absence of other specified parts of digestive tract: Secondary | ICD-10-CM | POA: Insufficient documentation

## 2013-04-18 DIAGNOSIS — N200 Calculus of kidney: Secondary | ICD-10-CM | POA: Insufficient documentation

## 2013-04-18 DIAGNOSIS — M47814 Spondylosis without myelopathy or radiculopathy, thoracic region: Secondary | ICD-10-CM | POA: Insufficient documentation

## 2013-04-18 DIAGNOSIS — I2510 Atherosclerotic heart disease of native coronary artery without angina pectoris: Secondary | ICD-10-CM | POA: Insufficient documentation

## 2013-04-18 MED ORDER — IOHEXOL 300 MG/ML  SOLN
100.0000 mL | Freq: Once | INTRAMUSCULAR | Status: AC | PRN
  Administered 2013-04-18: 100 mL via INTRAVENOUS

## 2013-04-21 NOTE — Progress Notes (Addendum)
[name], MD [address] [place] [address]  Adenocarcinoma of rectum - Plan: HYDROcodone-acetaminophen (NORCO/VICODIN) 5-325 MG per tablet, DISCONTINUED: HYDROcodone-acetaminophen (NORCO/VICODIN) 5-325 MG per tablet  2.3 cm solitary lung metastasis from rectal cancer  Pancytopenia, acquired  B12 deficiency  Shoulder pain, right - Plan: HYDROcodone-acetaminophen (NORCO/VICODIN) 5-325 MG per tablet, DISCONTINUED: HYDROcodone-acetaminophen (NORCO/VICODIN) 5-325 MG per tablet  Insomnia - Plan: temazepam (RESTORIL) 15 MG capsule, DISCONTINUED: temazepam (RESTORIL) 15 MG capsule  Muscle strain - Plan: cyclobenzaprine (FLEXERIL) 10 MG tablet, DISCONTINUED: cyclobenzaprine (FLEXERIL) 10 MG tablet  CURRENT THERAPY:Surveillance.  B12 injections monthly  INTERVAL HISTORY: Cedric Fishman 62 y.o. 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  He has been on drug-holiday since February of 2014 after completing 24 cycles of FOLFOX and Avastin.  His last PET/CT result in May of 2014 showed; " Persistent malignant range FDG uptake associated with the right upper lobe nodule. The degree of FDG uptake is not significantly improved from previous exam. The nodule in the right upper lobe demonstrates interval cavitation compared with previous exam. Decrease in hypermetabolism associated with multifocal liver metastasis. On today's study there is no abnormal uptake within the liver above background activity."  He has since undergone radiation therapy by Dr. Kathrynn Running to this right lung lesion.  CT CAP on 04/18/2013 shows a new right apex of the lung lesion measuring 4.5 mm in size and is medial to aforementioned lung lesion.   Akoni is here for follow-up after lab work and restaging CT CAP.  I personally reviewed and went over laboratory results with the patient.  Labs from 04/05/2013 shows a  WBC of 3.5, Hgb of 10.7 g/dL, platelet count of 40,981 with lymphopenia.  Metabolic panel in remarkable for renal insufficiency. Hypoalbuminemia, and total biliribin of 1.3.  CEA is WNL at 3.0, but over the past 3 months, it has been slowly trending upwards from 1.7 in August, to 2.6 in October and now 3.0 in November.   I personally reviewed and went over radiographic studies with the patient. The full report follows, but there is no focal liver lesions identified and the appearance of the abdomen and pelvis is stable.  However, there is a new 4.5 mm right lung apex nodule that is new.    I have sent a message to Dr. Kathrynn Running (Rad Onc) regarding this new lesion.  I suspect this will be amenable to radiation (SRS possibly).   Kellin would not be a good candidate for chemotherapy due to his pancytopenia and he has not received chemotherapy in many months (thus pancytopenia is not chemotherapy-induced).  Daymeon reports 2 complaints:  1. Muscle strain of right lat dorsi versus trapezius muscle.  There is some point tenderness to palpation.  It is worsened by positional changes.  He reports that it began about 4-6 weeks ago when he was doing yard work.  It resolved a few weeks later, but the other day he was carrying groceries and when he moved a particular way to manipulate the bag of groceries, the back pain returned.  I provided him education regarding muscle strain.  I recommend OTC NSAIDs such as Advil or Aleve.  Additionally, I will provide him with an RX for a muscle relaxant.   2. He also notes insomnia.  He reports that he falls asleep for 1-2 hours and then is up.  He already tried Benadryl  and he had a paradoxical effect to that medication with increased anxiety and "jitteriness."  He denies drowsiness or sleeping when he took Ativan in the past for nausea control when he was on chemotherapy. Therefore, I will give him an Rx for Restoril 15 mg capsules at HS PRN.   Oncologically, Red denies any  complaints and ROS questioning is negative.    Past Medical History  Diagnosis Date  . Pancytopenia, acquired 12/04/2010  . B12 deficiency 12/07/2010  . Hypertension   . Diabetes mellitus   . Hypercholesteremia   . Kidney stones   . CAD (coronary artery disease)   . Anemia   . History of radiation therapy 06/19/07 -07/27/07    whole pelvis  . Hx of radiation therapy 08/06/12,08/09/12,& 08/13/12    rul [id number]  . Cancer     Colon ca dx 2008 surg/rad/chemo  . Lung cancer     has DIABETES MELLITUS, TYPE II; HYPERCHOLESTEROLEMIA; ANEMIA, CHRONIC; CORONARY ARTERY DISEASE; SLEEP APNEA; Adenocarcinoma of rectum; Pancytopenia, acquired; B12 deficiency; 2.3 cm solitary lung metastasis from rectal cancer; Port catheter in place; and Hx of radiation therapy on his problem list.     is allergic to daypro.  Mr. Kudrna had no medications administered during this visit.  Past Surgical History  Procedure Laterality Date  . Ileostomy  12/07/2010    Procedure: ILEOSTOMY;  Surgeon: [name];  Location: [facility];  Service: General;  Laterality: N/A;  Diverting Ileostomy Lysis of adhesions Exploratory Laparotomy  . Lung biopsy  10/27/10    rt lobe- adenocarcinoma  . Cholecystectomy    . Cataract extraction w/ intraocular lens implant  2007    bil  . Lithotripsy  2002  . Coronary angioplasty with stent placement  2003    stenting x 2  . Colon resection  2008    Denies any headaches, dizziness, double vision, fevers, chills, night sweats, nausea, vomiting, diarrhea, constipation, chest pain, heart palpitations, shortness of breath, blood in stool, black tarry stool, urinary pain, urinary burning, urinary frequency, hematuria.   PHYSICAL EXAMINATION  ECOG PERFORMANCE STATUS: 1 - Symptomatic but completely ambulatory  Filed Vitals:   04/22/13 1028  BP: 120/74  Pulse: 60  Temp: 98.3 F (36.8 C)  Resp: 16    GENERAL:alert, no distress, well nourished, well developed, comfortable,  cooperative, obese and smiling SKIN: skin color, texture, turgor are normal, no rashes or significant lesions HEAD: Normocephalic, No masses, lesions, tenderness or abnormalities EYES: normal, PERRLA, EOMI, Conjunctiva are pink and non-injected EARS: External ears normal OROPHARYNX:mucous membranes are moist  NECK: supple, no adenopathy, thyroid normal size, non-tender, without nodularity, no stridor, non-tender, trachea midline LYMPH:  no palpable lymphadenopathy BREAST:not examined LUNGS: clear to auscultation and percussion, no breath sounds in the right base HEART: regular rate & rhythm, no murmurs, no gallops, S1 normal and S2 normal ABDOMEN:abdomen soft, non-tender, obese, normal bowel sounds, no masses or organomegaly and no hepatosplenomegaly BACK: Back symmetric, no curvature., No CVA tenderness EXTREMITIES:less then 2 second capillary refill, no joint deformities, effusion, or inflammation, no edema, no skin discoloration, no clubbing, no cyanosis  NEURO: alert & oriented x 3 with fluent speech, no focal motor/sensory deficits, gait normal    LABORATORY DATA: CBC    Component Value Date/Time   WBC 3.5* 04/05/2013 1008   RBC 3.44* 04/05/2013 1008   HGB 10.7* 04/05/2013 1008   HCT 32.3* 04/05/2013 1008   PLT 39* 04/05/2013 1008   MCV 93.9 04/05/2013 1008   MCH  31.1 04/05/2013 1008   MCHC 33.1 04/05/2013 1008   RDW 14.9 04/05/2013 1008   LYMPHSABS 0.5* 04/05/2013 1008   MONOABS 0.3 04/05/2013 1008   EOSABS 0.1 04/05/2013 1008   BASOSABS 0.0 04/05/2013 1008      Chemistry      Component Value Date/Time   NA 142 04/05/2013 1008   K 3.6 04/05/2013 1008   CL 110 04/05/2013 1008   CO2 24 04/05/2013 1008   BUN 9 04/05/2013 1008   CREATININE 1.38* 04/05/2013 1008      Component Value Date/Time   CALCIUM 9.0 04/05/2013 1008   ALKPHOS 107 04/05/2013 1008   AST 26 04/05/2013 1008   ALT 12 04/05/2013 1008   BILITOT 1.3* 04/05/2013 1008     Lab Results  Component Value Date   CEA 3.0  04/05/2013   Results for ABDOULIE, TIERCE (MRN [id number]) as of 04/21/2013 09:53  Ref. Range 01/11/2013 11:28 03/08/2013 13:20 04/05/2013 10:08  CEA Latest Range: 0.0-5.0 ng/mL 1.1 2.6 3.0     RADIOGRAPHIC STUDIES:  04/18/2013  CLINICAL DATA: Metastatic rectal carcinoma  EXAM:  CT CHEST, ABDOMEN, AND PELVIS WITH CONTRAST  TECHNIQUE:  Multidetector CT imaging of the chest, abdomen and pelvis was  performed following the standard protocol during bolus  administration of intravenous contrast.  CONTRAST: OMNIPAQUE IOHEXOL 300 MG/ML SOLN  COMPARISON: 01/14/2013  FINDINGS:  CT CHEST FINDINGS  Interval near resolution of small right pleural effusion. Stable  scarring/radiation change within the right upper lobe, image  15/series 3. There is a new pulmonary nodule in the right apex  measuring 5 mm, image 15/series 3.  The trachea appears patent and is midline. There is mild cardiac  enlargement. No pericardial effusion. Prominent calcifications are  noted involving the LAD and left circumflex coronary artery.  There is mild multilevel spondylosis identified within the thoracic  spine. No aggressive lytic or sclerotic bone lesion identified. Mild  compression deformity involving the T11 vertebra. This is unchanged  from previous exam.  CT ABDOMEN AND PELVIS FINDINGS  Appearance of Previous cholecystectomy. Normal appearance of the  pancreas. There is no biliary dilatation. Spleen is enlarged  measuring 14 cm in length. The adrenal glands are both normal.  Bilateral renal cysts are identified. Nonobstructing calculus within  the inferior pole of the right kidney measures 6 mm, image 65/series  2. Within the inferior pole of the left kidney there is a 7 mm  stone, image number 74/series 2. There is left-sided  pelvocaliectasis. Within the left renal pelvis there are 2 large  stones which measure up to 1.3 cm. The urinary bladder appears  normal. Prostate gland and seminal vesicles  are unremarkable.  Normal caliber of the abdominal aorta. There is mild calcified  atherosclerotic disease noted. No upper abdominal adenopathy  identified no enlarged pelvic lymph nodes.  Stomach is normal. The small bowel loops have a normal caliber  without evidence to suggest bowel obstruction. Subtotal colectomy  has been performed. There is a right lower quadrant ileostomy which  contains a peristomal hernia with nonobstructed loops of small  bowel. Similar appearance of circumferential thickening of the wall  of rectum, image 107/series 2. Next  Review of the visualized osseous structures is significant for  lumbar spondylosis. No aggressive lytic or sclerotic bone lesions  identified. Nonaggressive appearing sclerotic structure within the  left iliac bone is stable from previous exam, image number 85/series  2.  IMPRESSION:  1. New suspicious nodule within the right lung  apex in the area of  presumed radiation change.  2. No focal liver lesions are identified on today's examination to  suggest recurrence of liver metastases. The appearance of the  abdomen pelvis is essentially stable.  Electronically Signed  By: Signa Kell M.D.  On: 04/18/2013 10:46     ASSESSMENT:  1. 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 S/P 24 treatments of FOLFOX + Avastin, on chemotherapy holiday since Feb 2014. CT scan most recently on 04/18/2013 shows a new right apex of lung nodule measuring 4.5 mm but no other indication of disease.  2. S/P radiation by Dr. Kathrynn Running to right lung lesion on 08/06/2012, 08/09/2012, 08/13/2012 3. New right apex of lung nodule measuring 4.5 mm, concerning for malignancy. 4. Slowly rising CEA, but remains WNL. 5. Pancytopenia, stable 6. Splenomegaly 7. Back muscle strain  Patient Active Problem List   Diagnosis Date Noted  . Hx of radiation therapy   . Port catheter in place 09/04/2012  . 2.3 cm solitary lung metastasis from rectal  cancer 07/23/2012  . B12 deficiency 12/07/2010  . Pancytopenia, acquired 12/04/2010  . DIABETES MELLITUS, TYPE II 07/07/2010  . HYPERCHOLESTEROLEMIA 07/07/2010  . ANEMIA, CHRONIC 07/07/2010  . CORONARY ARTERY DISEASE 07/07/2010  . SLEEP APNEA 07/07/2010  . Adenocarcinoma of rectum 07/07/2010    PLAN:  1. I personally reviewed and went over laboratory results with the patient. 2. I personally reviewed and went over radiographic studies with the patient. 3. Will discuss case with Dr. Kathrynn Running regarding new right lung lesion. 4. Will contact pathologist to see if KRAS testing was performed on cancer and if not, request it be performed.  5. Influenza vaccine declined 6. Labs with port flush: CBC diff, CMET, CEA 7. Continue Vit B12 injections every 4 weeks 8. Patient education regarding muscle strain and its treatment 9. Recommend OTC NSAIDs such as Aleve or Advil 10. Rx for Flexeril 10 mg PO #30 q6hr PRN 11. Rx for Hydrocodone refill #45 12. Rx for Restoril 15 mg #30 with 1 refill PRN at HS 13. Return in 2 months for follow-up   THERAPY PLAN:  Tavi has a new right apex of lung lesion measuring only 4.5 mm in size.  His CEA is slowly rising and is up to 3.0 (which is WNL).  Devell is not a good candidate for chemotherapy due to his pancytopenia (not chemotherapy-induced).  Hopefully, his new lesion is amenable to [facility].  Message sent to Dr. Kathrynn Running via [facility].   All questions were answered. The patient knows to call the clinic with any problems, questions or concerns. We can certainly see the patient much sooner if necessary.  Patient and plan discussed with Dr. Alla German and he is in agreement with the aforementioned.   KEFALAS,THOMAS    Addendum:  I discussed the case with Dr. Kathrynn Running (RAD Onc).  He recommends a repeat CT in 6-12 weeks and if the nodule persists, would consider PET scan.  If either test seems to confirm malignancy, he would then consider stereotactic  radiotherapy.  I will set Davell up for a repeat CT scan in ~10 weeks.   KEFALAS,THOMAS

## 2013-04-22 ENCOUNTER — Encounter (HOSPITAL_BASED_OUTPATIENT_CLINIC_OR_DEPARTMENT_OTHER): Payer: Managed Care, Other (non HMO) | Admitting: Oncology

## 2013-04-22 VITALS — BP 120/74 | HR 60 | Temp 98.3°F | Resp 16 | Wt 228.4 lb

## 2013-04-22 DIAGNOSIS — C20 Malignant neoplasm of rectum: Secondary | ICD-10-CM

## 2013-04-22 DIAGNOSIS — D61818 Other pancytopenia: Secondary | ICD-10-CM

## 2013-04-22 DIAGNOSIS — R970 Elevated carcinoembryonic antigen [CEA]: Secondary | ICD-10-CM

## 2013-04-22 DIAGNOSIS — R911 Solitary pulmonary nodule: Secondary | ICD-10-CM

## 2013-04-22 DIAGNOSIS — Z85048 Personal history of other malignant neoplasm of rectum, rectosigmoid junction, and anus: Secondary | ICD-10-CM

## 2013-04-22 DIAGNOSIS — E538 Deficiency of other specified B group vitamins: Secondary | ICD-10-CM

## 2013-04-22 DIAGNOSIS — M25511 Pain in right shoulder: Secondary | ICD-10-CM

## 2013-04-22 DIAGNOSIS — C7800 Secondary malignant neoplasm of unspecified lung: Secondary | ICD-10-CM

## 2013-04-22 DIAGNOSIS — G4700 Insomnia, unspecified: Secondary | ICD-10-CM

## 2013-04-22 DIAGNOSIS — T148XXA Other injury of unspecified body region, initial encounter: Secondary | ICD-10-CM

## 2013-04-22 MED ORDER — HYDROCODONE-ACETAMINOPHEN 5-325 MG PO TABS: 1.0000 | ORAL_TABLET | Freq: Four times a day (QID) | ORAL | Status: DC | PRN

## 2013-04-22 MED ORDER — TEMAZEPAM 15 MG PO CAPS: 15.0000 mg | ORAL_CAPSULE | Freq: Every evening | ORAL | Status: DC | PRN

## 2013-04-22 MED ORDER — CYCLOBENZAPRINE HCL 10 MG PO TABS: 10.0000 mg | ORAL_TABLET | Freq: Three times a day (TID) | ORAL | Status: DC | PRN

## 2013-04-22 NOTE — Patient Instructions (Addendum)
.  [facility] Discharge Instructions  RECOMMENDATIONS MADE BY THE CONSULTANT AND ANY TEST RESULTS WILL BE SENT TO YOUR REFERRING PHYSICIAN.  EXAM FINDINGS BY THE PHYSICIAN TODAY AND SIGNS OR SYMPTOMS TO REPORT TO CLINIC OR PRIMARY PHYSICIAN: Exam and findings as discussed by T. Jacalyn Lefevre PA. Elijah Birk is going to send Dr. Kathrynn Running a message to look at your scans and advise  MEDICATIONS PRESCRIBED:  restoril for sleep Use advil or aleve and flexeril for muscle sprain Pain pill Rx given  INSTRUCTIONS/FOLLOW-UP: CEA every 6 weeks with port flush Chest xray in 4 weeks CT scans in 3 months 2 months to see Elijah Birk  Thank you for choosing [facility] to provide your oncology and hematology care.  To afford each patient quality time with our providers, please arrive at least 15 minutes before your scheduled appointment time.  With your help, our goal is to use those 15 minutes to complete the necessary work-up to ensure our physicians have the information they need to help with your evaluation and healthcare recommendations.    Effective January 1st, 2014, we ask that you re-schedule your appointment with our physicians should you arrive 10 or more minutes late for your appointment.  We strive to give you quality time with our providers, and arriving late affects you and other patients whose appointments are after yours.    Again, thank you for choosing [facility].  Our hope is that these requests will decrease the amount of time that you wait before being seen by our physicians.       _____________________________________________________________  Should you have questions after your visit to [facility], please contact our office at [phone] between the hours of 8:30 a.m. and 5:00 p.m.  Voicemails left after 4:30 p.m. will not be returned until the following business day.  For prescription refill requests, have your pharmacy contact our office  with your prescription refill request.

## 2013-04-26 NOTE — Progress Notes (Signed)
[name],  I reviewed the recent Chest CT and agree that the new 4.5 mm right upper lung nodule would be amenable to stereotactic radiation.  I would want a high level of certainty that it represents a metastatic deposit before proceeding.  It may be too small to be definitively characterized by PET-CT, and likewise small for biopsy.  I would recommend re-imaging with Chest CT in 6-12 weeks.  If the nodule persists/enlarges, then consider PET-CT or biopsy.  If either of these seems to confirm malignancy, then, proceed with stereotactic radiotherapy.  [name]

## 2013-04-29 ENCOUNTER — Other Ambulatory Visit (HOSPITAL_COMMUNITY): Payer: Self-pay | Admitting: Oncology

## 2013-04-29 NOTE — Addendum Note (Signed)
Addended by: Ellouise Newer on: 04/29/2013 10:43 AM   Modules accepted: Orders

## 2013-04-30 ENCOUNTER — Encounter (HOSPITAL_COMMUNITY): Payer: Managed Care, Other (non HMO) | Attending: Oncology

## 2013-04-30 ENCOUNTER — Inpatient Hospital Stay (HOSPITAL_COMMUNITY)
Admission: EM | Admit: 2013-04-30 | Discharge: 2013-05-03 | DRG: 394 | Disposition: A | Discharge status: Home / Self Care | Payer: Managed Care, Other (non HMO) | Attending: Internal Medicine | Admitting: Internal Medicine

## 2013-04-30 ENCOUNTER — Encounter (HOSPITAL_COMMUNITY): Payer: Self-pay | Admitting: Emergency Medicine

## 2013-04-30 VITALS — BP 134/72 | HR 82

## 2013-04-30 DIAGNOSIS — C7800 Secondary malignant neoplasm of unspecified lung: Secondary | ICD-10-CM

## 2013-04-30 DIAGNOSIS — K922 Gastrointestinal hemorrhage, unspecified: Secondary | ICD-10-CM

## 2013-04-30 DIAGNOSIS — Z95828 Presence of other vascular implants and grafts: Secondary | ICD-10-CM

## 2013-04-30 DIAGNOSIS — E119 Type 2 diabetes mellitus without complications: Secondary | ICD-10-CM

## 2013-04-30 DIAGNOSIS — K7460 Unspecified cirrhosis of liver: Secondary | ICD-10-CM | POA: Diagnosis present

## 2013-04-30 DIAGNOSIS — E538 Deficiency of other specified B group vitamins: Secondary | ICD-10-CM

## 2013-04-30 DIAGNOSIS — Z9221 Personal history of antineoplastic chemotherapy: Secondary | ICD-10-CM

## 2013-04-30 DIAGNOSIS — E7800 Pure hypercholesterolemia, unspecified: Secondary | ICD-10-CM

## 2013-04-30 DIAGNOSIS — I2510 Atherosclerotic heart disease of native coronary artery without angina pectoris: Secondary | ICD-10-CM

## 2013-04-30 DIAGNOSIS — D649 Anemia, unspecified: Secondary | ICD-10-CM

## 2013-04-30 DIAGNOSIS — N179 Acute kidney failure, unspecified: Secondary | ICD-10-CM | POA: Diagnosis present

## 2013-04-30 DIAGNOSIS — C20 Malignant neoplasm of rectum: Secondary | ICD-10-CM

## 2013-04-30 DIAGNOSIS — Z6832 Body mass index (BMI) 32.0-32.9, adult: Secondary | ICD-10-CM

## 2013-04-30 DIAGNOSIS — Z9049 Acquired absence of other specified parts of digestive tract: Secondary | ICD-10-CM

## 2013-04-30 DIAGNOSIS — T3995XA Adverse effect of unspecified nonopioid analgesic, antipyretic and antirheumatic, initial encounter: Secondary | ICD-10-CM | POA: Diagnosis present

## 2013-04-30 DIAGNOSIS — G4730 Sleep apnea, unspecified: Secondary | ICD-10-CM

## 2013-04-30 DIAGNOSIS — C787 Secondary malignant neoplasm of liver and intrahepatic bile duct: Secondary | ICD-10-CM | POA: Diagnosis present

## 2013-04-30 DIAGNOSIS — Z87891 Personal history of nicotine dependence: Secondary | ICD-10-CM

## 2013-04-30 DIAGNOSIS — D61818 Other pancytopenia: Secondary | ICD-10-CM

## 2013-04-30 DIAGNOSIS — N189 Chronic kidney disease, unspecified: Secondary | ICD-10-CM

## 2013-04-30 DIAGNOSIS — D696 Thrombocytopenia, unspecified: Secondary | ICD-10-CM

## 2013-04-30 DIAGNOSIS — D539 Nutritional anemia, unspecified: Secondary | ICD-10-CM

## 2013-04-30 DIAGNOSIS — C189 Malignant neoplasm of colon, unspecified: Secondary | ICD-10-CM

## 2013-04-30 DIAGNOSIS — Z932 Ileostomy status: Secondary | ICD-10-CM

## 2013-04-30 DIAGNOSIS — T451X5A Adverse effect of antineoplastic and immunosuppressive drugs, initial encounter: Secondary | ICD-10-CM | POA: Diagnosis present

## 2013-04-30 DIAGNOSIS — Z923 Personal history of irradiation: Secondary | ICD-10-CM

## 2013-04-30 DIAGNOSIS — Z800 Family history of malignant neoplasm of digestive organs: Secondary | ICD-10-CM

## 2013-04-30 DIAGNOSIS — Z8049 Family history of malignant neoplasm of other genital organs: Secondary | ICD-10-CM

## 2013-04-30 DIAGNOSIS — N200 Calculus of kidney: Secondary | ICD-10-CM | POA: Diagnosis present

## 2013-04-30 DIAGNOSIS — K449 Diaphragmatic hernia without obstruction or gangrene: Secondary | ICD-10-CM | POA: Diagnosis present

## 2013-04-30 DIAGNOSIS — Z9861 Coronary angioplasty status: Secondary | ICD-10-CM

## 2013-04-30 DIAGNOSIS — IMO0002 Reserved for concepts with insufficient information to code with codable children: Principal | ICD-10-CM | POA: Diagnosis present

## 2013-04-30 DIAGNOSIS — I129 Hypertensive chronic kidney disease with stage 1 through stage 4 chronic kidney disease, or unspecified chronic kidney disease: Secondary | ICD-10-CM | POA: Diagnosis present

## 2013-04-30 DIAGNOSIS — Y833 Surgical operation with formation of external stoma as the cause of abnormal reaction of the patient, or of later complication, without mention of misadventure at the time of the procedure: Secondary | ICD-10-CM | POA: Diagnosis present

## 2013-04-30 DIAGNOSIS — E669 Obesity, unspecified: Secondary | ICD-10-CM | POA: Diagnosis present

## 2013-04-30 DIAGNOSIS — Z85048 Personal history of other malignant neoplasm of rectum, rectosigmoid junction, and anus: Secondary | ICD-10-CM

## 2013-04-30 LAB — BASIC METABOLIC PANEL
BUN: 16 mg/dL (ref 6–23)
Calcium: 9.1 mg/dL (ref 8.4–10.5)
Creatinine, Ser: 2.89 mg/dL — ABNORMAL HIGH (ref 0.50–1.35)
GFR calc Af Amer: 26 mL/min — ABNORMAL LOW (ref >90)
GFR calc non Af Amer: 22 mL/min — ABNORMAL LOW (ref >90)
Potassium: 3.6 mEq/L (ref 3.5–5.1)
Sodium: 138 mEq/L (ref 135–145)

## 2013-04-30 LAB — CBC WITH DIFFERENTIAL/PLATELET
Basophils Absolute: 0 10*3/uL (ref 0.0–0.1)
Eosinophils Relative: 2 % (ref 0–5)
HCT: 33.4 % — ABNORMAL LOW (ref 39.0–52.0)
Lymphocytes Relative: 9 % — ABNORMAL LOW (ref 12–46)
Monocytes Relative: 10 % (ref 3–12)
Neutro Abs: 4.6 10*3/uL (ref 1.7–7.7)
Platelets: 61 10*3/uL — ABNORMAL LOW (ref 150–400)
RBC: 3.68 MIL/uL — ABNORMAL LOW (ref 4.22–5.81)
RDW: 14.6 % (ref 11.5–15.5)
WBC: 5.8 10*3/uL (ref 4.0–10.5)

## 2013-04-30 LAB — PROTIME-INR: Prothrombin Time: 14.9 seconds (ref 11.6–15.2)

## 2013-04-30 MED ORDER — CYANOCOBALAMIN 1000 MCG/ML IJ SOLN
1000.0000 ug | Freq: Once | INTRAMUSCULAR | Status: AC
  Administered 2013-04-30: 1000 ug via INTRAMUSCULAR

## 2013-04-30 MED ORDER — CYANOCOBALAMIN 1000 MCG/ML IJ SOLN
INTRAMUSCULAR | Status: AC
  Filled 2013-04-30: qty 1

## 2013-04-30 MED ORDER — SODIUM CHLORIDE 0.9 % IV SOLN
INTRAVENOUS | Status: DC
  Administered 2013-05-01: 100 mL/h via INTRAVENOUS

## 2013-04-30 NOTE — ED Notes (Signed)
Pt reports a few episodes of bloody stool in ostomy bag over the weekend. States that today after dinner, he noticed bright red blood in ostomy that has not decreased. Also reports passing "a blood clot the size of my fist". Denies any abdominal pain. C/o right sided flank pain, reports presently has kidney stone on the right side.

## 2013-04-30 NOTE — Progress Notes (Signed)
Cedric Fishman presents today for injection per MD orders. B12 1000 mcg administered SQ in right Upper Arm. Administration without incident. Patient tolerated well.

## 2013-04-30 NOTE — ED Notes (Signed)
MD at bedside. 

## 2013-04-30 NOTE — ED Notes (Signed)
Pt reporting he has a colostomy and about 2 hours ago, noticed active bleeding and blood clots being passed in bag.

## 2013-04-30 NOTE — ED Provider Notes (Signed)
CSN: [id number]     Arrival date & time 04/30/13  2125 History  This chart was scribed for Ward Givens, MD by Bennett Scrape, ED Scribe. This patient was seen in room APA07/APA07 and the patient's care was started at 11:06 PM.   Chief Complaint  Patient presents with  . GI Bleeding    The history is provided by the patient. No language interpreter was used.    HPI Comments: [name] is a 62 y.o. male with a colostomy since 2012 due to colon CA who presents to the Emergency Department complaining of two episodes of bleeding into the colostomy bag. He states that he noted blood in the bag described as 4 to 5 streaks 3 days ago while flushing out the bag. He states that he is unsure if the blood was coming from the ostomy site. He f/u with his surgeon right after he had his colostomy procedure and was told that bleeding is normal during the healing time. He states that he got a B12 injection yesterday and discussed his CA treatment due to a new lesion in his lung. He had a lesion diagnosed in 2012 and received radiation in 2014 with remission. He states that the lesion then reappeared on a scan this year. He reports that he also had a lesion on his liver but states that this cleared with chemotherapy. He denies any further symptoms until tonight. He reports that he had one episode of continuous bleeding while watching tonight that started 2 hours PTA. He denies any abdominal pain, nausea, emesis or changes in appetite. He denies feeling dizzy, lightheaded, or weak. He states he did have some mild dyspnea on exertion tonight when he was walking after noticing the bleeding. He is currently on Flexeril and hydrocodone PRN. He denies any excessive, current or prolonged NSAID use. He reports that the colon CA was diagnosed after prolonged and excessive rectal bleeding. He reports prior blood transfusions due to platelets dropping during treatment. Most recent blood work showed that the levels were still  decreased. He denies any smoking since 2008.  PCP is Dr. Phillips Odor GI is Dr. Jena Gauss Surgeon Dr Caesar Bookman  Past Medical History  Diagnosis Date  . Pancytopenia, acquired 12/04/2010  . B12 deficiency 12/07/2010  . Hypertension   . Diabetes mellitus   . Hypercholesteremia   . Kidney stones   . CAD (coronary artery disease)   . Anemia   . History of radiation therapy 06/19/07 -07/27/07    whole pelvis  . Hx of radiation therapy 08/06/12,08/09/12,& 08/13/12    rul [id number]/39fx  . Cancer     Colon ca dx 2008 surg/rad/chemo  . Lung cancer    Past Surgical History  Procedure Laterality Date  . Ileostomy  12/07/2010    Procedure: ILEOSTOMY;  Surgeon: [name];  Location: [facility];  Service: General;  Laterality: N/A;  Diverting Ileostomy Lysis of adhesions Exploratory Laparotomy  . Lung biopsy  10/27/10    rt lobe- adenocarcinoma  . Cholecystectomy    . Cataract extraction w/ intraocular lens implant  2007    bil  . Lithotripsy  2002  . Coronary angioplasty with stent placement  2003    stenting x 2  . Colon resection  2008   Family History  Problem Relation Age of Onset  . Cervical cancer Mother   . Rectal cancer Father   . Uterine cancer Sister    History  Substance Use Topics  . Smoking status: Former Smoker  Types: Cigarettes, Cigars  . Smokeless tobacco: Former Neurosurgeon    Types: Chew  . Alcohol Use: No  lives at home Lives with spouse  Review of Systems  Constitutional: Negative for fever.  Respiratory: Positive for shortness of breath (while wlaking into the ED due to anxiety, no h/o pulmonary conditions ).   Gastrointestinal: Negative for nausea, vomiting and abdominal pain.  Genitourinary: Positive for hematuria (from kidney stones ).  Neurological: Negative for dizziness, weakness and light-headedness.  All other systems reviewed and are negative.    Allergies  Daypro  Home Medications   Current Outpatient Rx  Name  Route  Sig  Dispense  Refill  .  cyanocobalamin (,VITAMIN B-12,) 1000 MCG/ML injection   Intramuscular   Inject 1,000 mcg into the muscle every 30 (thirty) days.          . cyclobenzaprine (FLEXERIL) 10 MG tablet   Oral   Take 1 tablet (10 mg total) by mouth 3 (three) times daily as needed for muscle spasms.   30 tablet   0   . HYDROcodone-acetaminophen (NORCO/VICODIN) 5-325 MG per tablet   Oral   Take 1 tablet by mouth every 6 (six) hours as needed.   45 tablet   0   . lidocaine-prilocaine (EMLA) cream   Topical   Apply 1 application topically as needed. Apply quarter sized amount over port-a-cath site one hour prior to chemotherapy appointment.         . temazepam (RESTORIL) 15 MG capsule   Oral   Take 1 capsule (15 mg total) by mouth at bedtime as needed for sleep.   30 capsule   1    Triage Vitals: BP 128/77  Pulse 97  Temp(Src) 98.5 F (36.9 C) (Oral)  Resp 20  Ht 5\' 10"  (1.778 m)  Wt 226 lb (102.513 kg)  BMI 32.43 kg/m2  SpO2 99%  Vital signs normal    Physical Exam  Nursing note and vitals reviewed. Constitutional: He is oriented to person, place, and time. He appears well-developed and well-nourished.  Non-toxic appearance. He does not appear ill. No distress.  HENT:  Head: Normocephalic and atraumatic.  Right Ear: External ear normal.  Left Ear: External ear normal.  Nose: Nose normal. No mucosal edema or rhinorrhea.  Mouth/Throat: Oropharynx is clear and moist and mucous membranes are normal. No dental abscesses or uvula swelling.  Poor dentition   Eyes: Conjunctivae and EOM are normal. Pupils are equal, round, and reactive to light.  Neck: Normal range of motion and full passive range of motion without pain. Neck supple.  Cardiovascular: Normal rate, regular rhythm and normal heart sounds.  Exam reveals no gallop and no friction rub.   No murmur heard. Pulmonary/Chest: Effort normal and breath sounds normal. No respiratory distress. He has no wheezes. He has no rhonchi. He has no  rales. He exhibits no tenderness and no crepitus.  Abdominal: Soft. Normal appearance. He exhibits no distension. Bowel sounds are decreased. There is no tenderness. There is no rebound and no guarding.    Colostomy to mid right abdomen with bright red blood present on the lining of the colostomy bag. Colostomy location noted  Musculoskeletal: Normal range of motion. He exhibits no edema and no tenderness.  Moves all extremities well.   Neurological: He is alert and oriented to person, place, and time. He has normal strength. No cranial nerve deficit.  Skin: Skin is warm, dry and intact. No rash noted. No erythema. There is pallor.  Psychiatric: He has a normal mood and affect. His speech is normal and behavior is normal. His mood appears not anxious.    ED Course  Procedures (including critical care time)  Medications  0.9 %  sodium chloride infusion (100 mL/hr Intravenous New Bag/Given 05/01/13 0031)  sodium chloride 0.9 % bolus 500 mL (500 mLs Intravenous Restarted 05/01/13 0156)     DIAGNOSTIC STUDIES: Oxygen Saturation is 99% on RA, normal by my interpretation.    COORDINATION OF CARE: 11:18 PM-Discussed treatment plan which includes CBC panel and BMP with pt at bedside and pt agreed to plan. Discussed with patient he will most likely need to be admitted for evaluation and to make sure that the bleeding resolves.  Per review of pt's records, he had blood work on 11/7 that showed a platelet level of 39,000 and a HBG of 10.7  00:55 Dr Orvan Falconer admit to tele  Labs Review Results for orders placed during the hospital encounter of 04/30/13  CBC WITH DIFFERENTIAL      Result Value Range   WBC 5.8  4.0 - 10.5 K/uL   RBC 3.68 (*) 4.22 - 5.81 MIL/uL   Hemoglobin 10.9 (*) 13.0 - 17.0 g/dL   HCT 16.1 (*) 09.6 - 04.5 %   MCV 90.8  78.0 - 100.0 fL   MCH 29.6  26.0 - 34.0 pg   MCHC 32.6  30.0 - 36.0 g/dL   RDW 40.9  81.1 - 91.4 %   Platelets 61 (*) 150 - 400 K/uL   Neutrophils  Relative % 79 (*) 43 - 77 %   Lymphocytes Relative 9 (*) 12 - 46 %   Monocytes Relative 10  3 - 12 %   Eosinophils Relative 2  0 - 5 %   Basophils Relative 0  0 - 1 %   Neutro Abs 4.6  1.7 - 7.7 K/uL   Lymphs Abs 0.5 (*) 0.7 - 4.0 K/uL   Monocytes Absolute 0.6  0.1 - 1.0 K/uL   Eosinophils Absolute 0.1  0.0 - 0.7 K/uL   Basophils Absolute 0.0  0.0 - 0.1 K/uL   Smear Review PLATELET COUNT CONFIRMED BY SMEAR    BASIC METABOLIC PANEL      Result Value Range   Sodium 138  135 - 145 mEq/L   Potassium 3.6  3.5 - 5.1 mEq/L   Chloride 104  96 - 112 mEq/L   CO2 23  19 - 32 mEq/L   Glucose, Bld 145 (*) 70 - 99 mg/dL   BUN 16  6 - 23 mg/dL   Creatinine, Ser 7.82 (*) 0.50 - 1.35 mg/dL   Calcium 9.1  8.4 - 95.6 mg/dL   GFR calc non Af Amer 22 (*) >90 mL/min   GFR calc Af Amer 26 (*) >90 mL/min  URINALYSIS, ROUTINE W REFLEX MICROSCOPIC      Result Value Range   Color, Urine BROWN (*) YELLOW   APPearance CLOUDY (*) CLEAR   Specific Gravity, Urine >1.030 (*) 1.005 - 1.030   pH 5.5  5.0 - 8.0   Glucose, UA NEGATIVE  NEGATIVE mg/dL   Hgb urine dipstick LARGE (*) NEGATIVE   Bilirubin Urine NEGATIVE  NEGATIVE   Ketones, ur TRACE (*) NEGATIVE mg/dL   Protein, ur 213 (*) NEGATIVE mg/dL   Urobilinogen, UA 1.0  0.0 - 1.0 mg/dL   Nitrite POSITIVE (*) NEGATIVE   Leukocytes, UA TRACE (*) NEGATIVE  APTT      Result Value Range  aPTT 34  24 - 37 seconds  PROTIME-INR      Result Value Range   Prothrombin Time 14.9  11.6 - 15.2 seconds   INR 1.20  0.00 - 1.49  URINE MICROSCOPIC-ADD ON      Result Value Range   Squamous Epithelial / LPF RARE  RARE   RBC / HPF TOO NUMEROUS TO COUNT  <3 RBC/hpf   Bacteria, UA MANY (*) RARE  TYPE AND SCREEN      Result Value Range   ABO/RH(D) A POS     Antibody Screen NEG     Sample Expiration 05/03/2013     Laboratory interpretation all normal except Stable thrombocytopenia, stable anemia, worsening of his chronic renal insufficiency, concentrated urine  consistent with dehydration    Imaging Review No results found.  Ct Chest W Contrast Ct Abdomen Pelvis W Contrast  04/18/2013   CLINICAL DATA:  Metastatic rectal carcinoma  EXAM: CT CHEST, ABDOMEN, AND PELVIS WITH CONTRAST  TECHNIQUE: Multidetector CT imaging of the chest, abdomen and pelvis was performed following the standard protocol during bolus administration of intravenous contrast.  CONTRAST:  OMNIPAQUE IOHEXOL 300 MG/ML  SOLN  COMPARISON:  01/14/2013  FINDINGS: CT CHEST FINDINGS  Interval near resolution of small right pleural effusion. Stable scarring/radiation change within the right upper lobe, image 15/series 3. There is a new pulmonary nodule in the right apex measuring 5 mm, image 15/series 3.  The trachea appears patent and is midline. There is mild cardiac enlargement. No pericardial effusion. Prominent calcifications are noted involving the LAD and left circumflex coronary artery.  There is mild multilevel spondylosis identified within the thoracic spine. No aggressive lytic or sclerotic bone lesion identified. Mild compression deformity involving the T11 vertebra. This is unchanged from previous exam.  CT ABDOMEN AND PELVIS FINDINGS  Appearance of Previous cholecystectomy. Normal appearance of the pancreas. There is no biliary dilatation. Spleen is enlarged measuring 14 cm in length. The adrenal glands are both normal. Bilateral renal cysts are identified. Nonobstructing calculus within the inferior pole of the right kidney measures 6 mm, image 65/series 2. Within the inferior pole of the left kidney there is a 7 mm stone, image number 74/series 2. There is left-sided pelvocaliectasis. Within the left renal pelvis there are 2 large stones which measure up to 1.3 cm. The urinary bladder appears normal. Prostate gland and seminal vesicles are unremarkable.  Normal caliber of the abdominal aorta. There is mild calcified atherosclerotic disease noted. No upper abdominal adenopathy  identified no enlarged pelvic lymph nodes.  Stomach is normal. The small bowel loops have a normal caliber without evidence to suggest bowel obstruction. Subtotal colectomy has been performed. There is a right lower quadrant ileostomy which contains a peristomal hernia with nonobstructed loops of small bowel. Similar appearance of circumferential thickening of the wall of rectum, image 107/series 2. Next  Review of the visualized osseous structures is significant for lumbar spondylosis. No aggressive lytic or sclerotic bone lesions identified. Nonaggressive appearing sclerotic structure within the left iliac bone is stable from previous exam, image number 85/series 2.  IMPRESSION: 1. New suspicious nodule within the right lung apex in the area of presumed radiation change. 2. No focal liver lesions are identified on today's examination to suggest recurrence of liver metastases. The appearance of the abdomen pelvis is essentially stable.   Electronically Signed   By: Signa Kell M.D.   On: 04/18/2013 10:46       MDM   1. GI  bleeding   2. Colon cancer   3. Anemia   4. Thrombocytopenia   5. Acute on chronic renal insufficiency    Plan admission    Devoria Albe, MD, FACEP   I personally performed the services described in this documentation, which was scribed in my presence. The recorded information has been reviewed and considered.  Devoria Albe, MD, FACEP    Ward Givens, MD 05/01/13 [phone]

## 2013-05-01 ENCOUNTER — Inpatient Hospital Stay (HOSPITAL_COMMUNITY): Payer: Managed Care, Other (non HMO)

## 2013-05-01 ENCOUNTER — Encounter (HOSPITAL_COMMUNITY): Payer: Self-pay | Admitting: Internal Medicine

## 2013-05-01 DIAGNOSIS — D696 Thrombocytopenia, unspecified: Secondary | ICD-10-CM | POA: Diagnosis present

## 2013-05-01 DIAGNOSIS — D649 Anemia, unspecified: Secondary | ICD-10-CM

## 2013-05-01 DIAGNOSIS — K922 Gastrointestinal hemorrhage, unspecified: Secondary | ICD-10-CM | POA: Diagnosis present

## 2013-05-01 DIAGNOSIS — C20 Malignant neoplasm of rectum: Secondary | ICD-10-CM

## 2013-05-01 DIAGNOSIS — C189 Malignant neoplasm of colon, unspecified: Secondary | ICD-10-CM

## 2013-05-01 DIAGNOSIS — N189 Chronic kidney disease, unspecified: Secondary | ICD-10-CM

## 2013-05-01 DIAGNOSIS — Z9049 Acquired absence of other specified parts of digestive tract: Secondary | ICD-10-CM

## 2013-05-01 DIAGNOSIS — N289 Disorder of kidney and ureter, unspecified: Secondary | ICD-10-CM

## 2013-05-01 DIAGNOSIS — K625 Hemorrhage of anus and rectum: Secondary | ICD-10-CM

## 2013-05-01 LAB — CBC
HCT: 29.8 % — ABNORMAL LOW (ref 39.0–52.0)
MCH: 30.3 pg (ref 26.0–34.0)
MCHC: 33.6 g/dL (ref 30.0–36.0)
MCV: 90.3 fL (ref 78.0–100.0)
Platelets: 57 10*3/uL — ABNORMAL LOW (ref 150–400)
RBC: 3.3 MIL/uL — ABNORMAL LOW (ref 4.22–5.81)
WBC: 6.3 10*3/uL (ref 4.0–10.5)

## 2013-05-01 LAB — BASIC METABOLIC PANEL
CO2: 22 mEq/L (ref 19–32)
Calcium: 8.7 mg/dL (ref 8.4–10.5)
Creatinine, Ser: 2.78 mg/dL — ABNORMAL HIGH (ref 0.50–1.35)
Glucose, Bld: 113 mg/dL — ABNORMAL HIGH (ref 70–99)

## 2013-05-01 LAB — URINE MICROSCOPIC-ADD ON

## 2013-05-01 LAB — GLUCOSE, CAPILLARY

## 2013-05-01 LAB — URINALYSIS, ROUTINE W REFLEX MICROSCOPIC
Bilirubin Urine: NEGATIVE
Nitrite: POSITIVE — AB
Protein, ur: 100 mg/dL — AB
Urobilinogen, UA: 1 mg/dL (ref 0.0–1.0)

## 2013-05-01 LAB — OCCULT BLOOD, POC DEVICE: Fecal Occult Bld: POSITIVE — AB

## 2013-05-01 MED ORDER — ONDANSETRON HCL 4 MG/2ML IJ SOLN: 4.0000 mg | INTRAMUSCULAR | Status: DC | PRN

## 2013-05-01 MED ORDER — SODIUM CHLORIDE 0.9 % IV BOLUS (SEPSIS)
500.0000 mL | Freq: Once | INTRAVENOUS | Status: AC
  Administered 2013-05-01: 500 mL via INTRAVENOUS

## 2013-05-01 MED ORDER — INSULIN ASPART 100 UNIT/ML ~~LOC~~ SOLN
0.0000 [IU] | SUBCUTANEOUS | Status: DC
  Administered 2013-05-02: 1 [IU] via SUBCUTANEOUS

## 2013-05-01 MED ORDER — SODIUM CHLORIDE 0.9 % IJ SOLN
3.0000 mL | Freq: Two times a day (BID) | INTRAMUSCULAR | Status: DC
  Administered 2013-05-02: 3 mL via INTRAVENOUS

## 2013-05-01 MED ORDER — SODIUM CHLORIDE 0.9 % IV SOLN
INTRAVENOUS | Status: DC
Start: 2013-05-01 — End: 2013-05-03
  Administered 2013-05-01 – 2013-05-02 (×4): via INTRAVENOUS

## 2013-05-01 NOTE — Progress Notes (Signed)
PATIENT DETAILS Name: [name] Age: 62 y.o. Sex: male Date of Birth: Jul 09, 1950 Admit Date: 04/30/2013 Admitting Physician Vania Rea, MD ZOX:[id number], Chancy Hurter, MD  Subjective: Admitted with blood in his colostomy. No further blood seen since admission.  Assessment/Plan: Active Problems: Bleeding from ileostomy site - History of rectal cancer status post subtotal colectomy with ileostomy - Suspect GI bleed - Await GI evaluation for endoscopic evaluation  Acute renal failure - Continue with hydration. Patient looks almost euvolemic. Avoid nephrotoxic agents. We'll order a renal ultrasound.  - Suspect this could be secondary to GI bleed.  Anemia - Hemoglobin close to her usual baseline, mild drop since admission, suspect this is probably secondary to dilution. Suspect anemia to be chronic from underlying chronic disease from  Malignancy.  Thrombocytopenia - Chronic. Monitor platelets closely.  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 - Reviewed outpatient radiology oncology notes and medical oncology notes. For a medical oncology note, patient is a poor candidate for further chemotherapy given his pancytopenia.  ? History of diabetes - CBGs stable - Cautiously continue with SSI  Disposition: Remain inpatient  DVT Prophylaxis:  SCD's  Code Status: Full code   Family Communication Multiple family members at bedside this morning.  Procedures:  None  CONSULTS:  GI  Time spent 40 minutes-which includes 50% of the time with face-to-face with patient and family.   MEDICATIONS: Scheduled Meds: . insulin aspart  0-9 Units Subcutaneous Q4H  . sodium chloride  3 mL Intravenous Q12H   Continuous Infusions: . sodium chloride 150 mL/hr at 05/01/13 1409   PRN Meds:.ondansetron (ZOFRAN) IV  Antibiotics: Anti-infectives   None       PHYSICAL EXAM: Vital signs in last 24 hours: Filed Vitals:   05/01/13 0235 05/01/13  0646 05/01/13 0951 05/01/13 1326  BP: 125/93 120/80 105/69 132/82  Pulse: 80 73 71 73  Temp: 98.4 F (36.9 C) 98.1 F (36.7 C) 98.7 F (37.1 C) 97.4 F (36.3 C)  TempSrc: Oral Oral Oral Axillary  Resp: 18 20 20 18   Height: 5\' 10"  (1.778 m)     Weight: 97.251 kg (214 lb 6.4 oz)     SpO2: 99% 96% 97% 99%    Weight change:  Filed Weights   04/30/13 2135 05/01/13 0235  Weight: 102.513 kg (226 lb) 97.251 kg (214 lb 6.4 oz)   Body mass index is 30.76 kg/(m^2).   Gen Exam: Awake and alert with clear speech.   Neck: Supple, No JVD.   Chest: B/L Clear.   CVS: S1 S2 Regular, no murmurs.  Abdomen: soft, BS +, non tender, non distended. Ileostomy in place, no blood seen, brown stools present. Extremities: no edema, lower extremities warm to touch Neurologic: Non Focal.   Skin: No Rash.  Wounds: N/A.    Intake/Output from previous day:  Intake/Output Summary (Last 24 hours) at 05/01/13 1421 Last data filed at 05/01/13 1327  Gross per 24 hour  Intake    240 ml  Output      0 ml  Net    240 ml     LAB RESULTS: CBC  Recent Labs Lab 04/30/13 2236 05/01/13 0642  WBC 5.8 6.3  HGB 10.9* 10.0*  HCT 33.4* 29.8*  PLT 61* 57*  MCV 90.8 90.3  MCH 29.6 30.3  MCHC 32.6 33.6  RDW 14.6 14.5  LYMPHSABS 0.5*  --   MONOABS 0.6  --   EOSABS 0.1  --   BASOSABS 0.0  --  Chemistries   Recent Labs Lab 04/30/13 2236 05/01/13 0642  NA 138 140  K 3.6 3.7  CL 104 109  CO2 23 22  GLUCOSE 145* 113*  BUN 16 17  CREATININE 2.89* 2.78*  CALCIUM 9.1 8.7    CBG:  Recent Labs Lab 05/01/13 0745 05/01/13 1122  GLUCAP 105* 88    GFR Estimated Creatinine Clearance: 33.1 ml/min (by C-G formula based on Cr of 2.78).  Coagulation profile  Recent Labs Lab 04/30/13 2320  INR 1.20    Cardiac Enzymes No results found for this basename: CK, CKMB, TROPONINI, MYOGLOBIN,  in the last 168 hours  No components found with this basename: POCBNP,  No results found for this  basename: DDIMER,  in the last 72 hours No results found for this basename: HGBA1C,  in the last 72 hours No results found for this basename: CHOL, HDL, LDLCALC, TRIG, CHOLHDL, LDLDIRECT,  in the last 72 hours No results found for this basename: TSH, T4TOTAL, FREET3, T3FREE, THYROIDAB,  in the last 72 hours No results found for this basename: VITAMINB12, FOLATE, FERRITIN, TIBC, IRON, RETICCTPCT,  in the last 72 hours No results found for this basename: LIPASE, AMYLASE,  in the last 72 hours  Urine Studies No results found for this basename: UACOL, UAPR, USPG, UPH, UTP, UGL, UKET, UBIL, UHGB, UNIT, UROB, ULEU, UEPI, UWBC, URBC, UBAC, CAST, CRYS, UCOM, BILUA,  in the last 72 hours  MICROBIOLOGY: No results found for this or any previous visit (from the past 240 hour(s)).  RADIOLOGY STUDIES/RESULTS: Ct Chest W Contrast  04/18/2013   CLINICAL DATA:  Metastatic rectal carcinoma  EXAM: CT CHEST, ABDOMEN, AND PELVIS WITH CONTRAST  TECHNIQUE: Multidetector CT imaging of the chest, abdomen and pelvis was performed following the standard protocol during bolus administration of intravenous contrast.  CONTRAST:  OMNIPAQUE IOHEXOL 300 MG/ML  SOLN  COMPARISON:  01/14/2013  FINDINGS: CT CHEST FINDINGS  Interval near resolution of small right pleural effusion. Stable scarring/radiation change within the right upper lobe, image 15/series 3. There is a new pulmonary nodule in the right apex measuring 5 mm, image 15/series 3.  The trachea appears patent and is midline. There is mild cardiac enlargement. No pericardial effusion. Prominent calcifications are noted involving the LAD and left circumflex coronary artery.  There is mild multilevel spondylosis identified within the thoracic spine. No aggressive lytic or sclerotic bone lesion identified. Mild compression deformity involving the T11 vertebra. This is unchanged from previous exam.  CT ABDOMEN AND PELVIS FINDINGS  Appearance of Previous cholecystectomy.  Normal appearance of the pancreas. There is no biliary dilatation. Spleen is enlarged measuring 14 cm in length. The adrenal glands are both normal. Bilateral renal cysts are identified. Nonobstructing calculus within the inferior pole of the right kidney measures 6 mm, image 65/series 2. Within the inferior pole of the left kidney there is a 7 mm stone, image number 74/series 2. There is left-sided pelvocaliectasis. Within the left renal pelvis there are 2 large stones which measure up to 1.3 cm. The urinary bladder appears normal. Prostate gland and seminal vesicles are unremarkable.  Normal caliber of the abdominal aorta. There is mild calcified atherosclerotic disease noted. No upper abdominal adenopathy identified no enlarged pelvic lymph nodes.  Stomach is normal. The small bowel loops have a normal caliber without evidence to suggest bowel obstruction. Subtotal colectomy has been performed. There is a right lower quadrant ileostomy which contains a peristomal hernia with nonobstructed loops of small bowel. Similar appearance  of circumferential thickening of the wall of rectum, image 107/series 2. Next  Review of the visualized osseous structures is significant for lumbar spondylosis. No aggressive lytic or sclerotic bone lesions identified. Nonaggressive appearing sclerotic structure within the left iliac bone is stable from previous exam, image number 85/series 2.  IMPRESSION: 1. New suspicious nodule within the right lung apex in the area of presumed radiation change. 2. No focal liver lesions are identified on today's examination to suggest recurrence of liver metastases. The appearance of the abdomen pelvis is essentially stable.   Electronically Signed   By: Signa Kell M.D.   On: 04/18/2013 10:46   Ct Abdomen Pelvis W Contrast  04/18/2013   CLINICAL DATA:  Metastatic rectal carcinoma  EXAM: CT CHEST, ABDOMEN, AND PELVIS WITH CONTRAST  TECHNIQUE: Multidetector CT imaging of the chest, abdomen and  pelvis was performed following the standard protocol during bolus administration of intravenous contrast.  CONTRAST:  OMNIPAQUE IOHEXOL 300 MG/ML  SOLN  COMPARISON:  01/14/2013  FINDINGS: CT CHEST FINDINGS  Interval near resolution of small right pleural effusion. Stable scarring/radiation change within the right upper lobe, image 15/series 3. There is a new pulmonary nodule in the right apex measuring 5 mm, image 15/series 3.  The trachea appears patent and is midline. There is mild cardiac enlargement. No pericardial effusion. Prominent calcifications are noted involving the LAD and left circumflex coronary artery.  There is mild multilevel spondylosis identified within the thoracic spine. No aggressive lytic or sclerotic bone lesion identified. Mild compression deformity involving the T11 vertebra. This is unchanged from previous exam.  CT ABDOMEN AND PELVIS FINDINGS  Appearance of Previous cholecystectomy. Normal appearance of the pancreas. There is no biliary dilatation. Spleen is enlarged measuring 14 cm in length. The adrenal glands are both normal. Bilateral renal cysts are identified. Nonobstructing calculus within the inferior pole of the right kidney measures 6 mm, image 65/series 2. Within the inferior pole of the left kidney there is a 7 mm stone, image number 74/series 2. There is left-sided pelvocaliectasis. Within the left renal pelvis there are 2 large stones which measure up to 1.3 cm. The urinary bladder appears normal. Prostate gland and seminal vesicles are unremarkable.  Normal caliber of the abdominal aorta. There is mild calcified atherosclerotic disease noted. No upper abdominal adenopathy identified no enlarged pelvic lymph nodes.  Stomach is normal. The small bowel loops have a normal caliber without evidence to suggest bowel obstruction. Subtotal colectomy has been performed. There is a right lower quadrant ileostomy which contains a peristomal hernia with nonobstructed loops of  small bowel. Similar appearance of circumferential thickening of the wall of rectum, image 107/series 2. Next  Review of the visualized osseous structures is significant for lumbar spondylosis. No aggressive lytic or sclerotic bone lesions identified. Nonaggressive appearing sclerotic structure within the left iliac bone is stable from previous exam, image number 85/series 2.  IMPRESSION: 1. New suspicious nodule within the right lung apex in the area of presumed radiation change. 2. No focal liver lesions are identified on today's examination to suggest recurrence of liver metastases. The appearance of the abdomen pelvis is essentially stable.   Electronically Signed   By: Signa Kell M.D.   On: 04/18/2013 10:46    Jeoffrey Massed, MD  [facility] Pager:[phone]  If 7PM-7AM, please contact night-coverage [email or web address] Password [email or web address] 05/01/2013, 2:21 PM   LOS: 1 day

## 2013-05-01 NOTE — H&P (Signed)
[facility] History and Physical  [name]  ZOX:[id number]  DOB: 06/14/1950   DOA: 05/01/2013   PCP:   [name], MD   Chief Complaint:  Blood in the ileostomy bag  HPI: [name] is a 62 y.o. male.   Obese Caucasian man, s/p total colectomy for colon cancer, with radiation to metastases to the lung and chemotherapy for metastases to the liver, who now apparently has a new small metastases to the lung, has been doing very well, but since Saturday has been noticing episodic blood appearing in his ileostomy bag. This evening he noticed the bag completely filled up with blood and came to the emergency room.  He is having no dizziness, no diaphoresis no nausea or vomiting  He has episodic hematuria which she says is due to his kidney stones He has thrombocytopenia believed to be chemotherapy related    Past Medical History  Diagnosis Date  . Pancytopenia, acquired 12/04/2010  . B12 deficiency 12/07/2010  . Hypertension   . Diabetes mellitus   . Hypercholesteremia   . Kidney stones   . CAD (coronary artery disease)   . Anemia   . History of radiation therapy 06/19/07 -07/27/07    whole pelvis  . Hx of radiation therapy 08/06/12,08/09/12,& 08/13/12    rul 54GY/28fx  . Cancer     Colon ca dx 2008 surg/rad/chemo  . Lung cancer     Past Surgical History  Procedure Laterality Date  . Ileostomy  12/07/2010    Procedure: ILEOSTOMY;  Surgeon: [name];  Location: [facility];  Service: General;  Laterality: N/A;  Diverting Ileostomy Lysis of adhesions Exploratory Laparotomy  . Lung biopsy  10/27/10    rt lobe- adenocarcinoma  . Cholecystectomy    . Cataract extraction w/ intraocular lens implant  2007    bil  . Lithotripsy  2002  . Coronary angioplasty with stent placement  2003    stenting x 2  . Colon resection  2008    Medications:  HOME MEDS: Prior to Admission medications   Medication Sig Start Date End Date Taking? Authorizing Provider   cyanocobalamin (,VITAMIN B-12,) 1000 MCG/ML injection Inject 1,000 mcg into the muscle every 30 (thirty) days.    Yes Historical Provider, MD  cyclobenzaprine (FLEXERIL) 10 MG tablet Take 1 tablet (10 mg total) by mouth 3 (three) times daily as needed for muscle spasms. 04/22/13  Yes Ellouise Newer, PA-C  HYDROcodone-acetaminophen (NORCO/VICODIN) 5-325 MG per tablet Take 1 tablet by mouth every 6 (six) hours as needed. 04/22/13  Yes Maurine Minister Kefalas, PA-C  lidocaine-prilocaine (EMLA) cream Apply 1 application topically as needed. Apply quarter sized amount over port-a-cath site one hour prior to chemotherapy appointment. 03/30/12  Yes Randall An, MD  temazepam (RESTORIL) 15 MG capsule Take 1 capsule (15 mg total) by mouth at bedtime as needed for sleep. 04/22/13  Yes Ellouise Newer, PA-C     Allergies:  Allergies  Allergen Reactions  . Daypro [Oxaprozin] Nausea And Vomiting    Social History:   reports that he has quit smoking. His smoking use included Cigarettes and Cigars. He smoked 0.00 packs per day. He has quit using smokeless tobacco. His smokeless tobacco use included Chew. He reports that he does not drink alcohol or use illicit drugs.  Family History: Family History  Problem Relation Age of Onset  . Cervical cancer Mother   . Rectal cancer Father   . Uterine cancer Sister  Physical Exam: Filed Vitals:   04/30/13 2334 04/30/13 2336 04/30/13 2337 05/01/13 0127  BP: 114/74 110/70 104/61 125/68  Pulse: 89 93 104 79  Temp:      TempSrc:      Resp:    18  Height:      Weight:      SpO2:    97%   Blood pressure 125/68, pulse 79, temperature 98.5 F (36.9 C), temperature source Oral, resp. rate 18, height 5\' 10"  (1.778 m), weight 102.513 kg (226 lb), SpO2 97.00%. Body mass index is 32.43 kg/(m^2).   GEN:  Pleasant Caucasian gentleman lying bed in no acute distress; cooperative with exam PSYCH:  alert and oriented x4; neither anxious nor depressed; affect is  appropriate. HEENT: Mucous membranes pink and anicteric; PERRLA; EOM intact; no cervical lymphadenopathy nor thyromegaly or carotid bruit; no JVD; Breasts:: Not examined CHEST WALL: No tenderness CHEST: Normal respiration, clear to auscultation bilaterally HEART: Regular rate and rhythm; no murmurs rubs or gallops BACK: No kyphosis no scoliosis; no CVA tenderness ABDOMEN: Obese, soft non-tender; no masses, no organomegaly, normal abdominal bowel sounds; ileostomy bag contains small amount of brown stool with small amount of blood mixed. Rectal Exam: Not done EXTREMITIES: No bone or joint deformity; age-appropriate arthropathy of the hands and knees; no edema; no ulcerations. Genitalia: not examined PULSES: 2+ and symmetric SKIN: Normal hydration no rash or ulceration CNS: Cranial nerves 2-12 grossly intact no focal lateralizing neurologic deficit   Labs on Admission:  Basic Metabolic Panel:  Recent Labs Lab 04/30/13 2236  NA 138  K 3.6  CL 104  CO2 23  GLUCOSE 145*  BUN 16  CREATININE 2.89*  CALCIUM 9.1   Liver Function Tests: No results found for this basename: AST, ALT, ALKPHOS, BILITOT, PROT, ALBUMIN,  in the last 168 hours No results found for this basename: LIPASE, AMYLASE,  in the last 168 hours No results found for this basename: AMMONIA,  in the last 168 hours CBC:  Recent Labs Lab 04/30/13 2236  WBC 5.8  NEUTROABS 4.6  HGB 10.9*  HCT 33.4*  MCV 90.8  PLT 61*   Cardiac Enzymes: No results found for this basename: CKTOTAL, CKMB, CKMBINDEX, TROPONINI,  in the last 168 hours BNP: No components found with this basename: POCBNP,  D-dimer: No components found with this basename: D-DIMER,  CBG: No results found for this basename: GLUCAP,  in the last 168 hours  Radiological Exams on Admission: No results found.    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  Code Status: Full code   Denell Cothern Nocturnist [facility] Pager [phone]   05/01/2013, 1:39 AM

## 2013-05-01 NOTE — ED Notes (Signed)
Implanted port accessed by V. Rice, RN

## 2013-05-01 NOTE — Consult Note (Signed)
Reason for Consult: GI bleed Referring Physician: Hospitalist  [name] is an 62 y.o. male.  HPI Admitted thru the ED last night fir bright red bleeding into his colostomy bag.  He says says the blood filled the colostomy bag 1/2 full.  He says he also noticed a large blood clot.  Bleeding started around 9pm yesterday.  There is no apparent bleeding this am.  Hx of rectal cancer and underwent a subtotal colectomy in 2008 by Dr. Leticia Penna. In 2012: He underwent an exploratory laparotomy with extensive lysis of adhesions, small bowel resection with primary side-to-side anastomoses, diverting loop ileostomy.  He has a  2.3 cm solitary lung metastasis and liver metastases from rectal cancer - Stage IV Presently not receiving chemo. Last Chemo in March of this year.  Ct scan in November: IMPRESSION:  1. New suspicious nodule within the right lung apex in the area of  presumed radiation change.  2. No focal liver lesions are identified on today's examination to  suggest recurrence of liver metastases. The appearance of the  abdomen pelvis is essentially stable.   Past Medical History  Diagnosis Date  . Pancytopenia, acquired 12/04/2010  . B12 deficiency 12/07/2010  . Hypertension   . Diabetes mellitus   . Hypercholesteremia   . Kidney stones   . CAD (coronary artery disease)   . Anemia   . History of radiation therapy 06/19/07 -07/27/07    whole pelvis  . Hx of radiation therapy 08/06/12,08/09/12,& 08/13/12    rul 54GY/38fx  . Cancer     Colon ca dx 2008 surg/rad/chemo  . Lung cancer     Past Surgical History  Procedure Laterality Date  . Ileostomy  12/07/2010    Procedure: ILEOSTOMY;  Surgeon: [name];  Location: [facility];  Service: General;  Laterality: N/A;  Diverting Ileostomy Lysis of adhesions Exploratory Laparotomy  . Lung biopsy  10/27/10    rt lobe- adenocarcinoma  . Cholecystectomy    . Cataract extraction w/ intraocular lens implant  2007    bil  . Lithotripsy   2002  . Coronary angioplasty with stent placement  2003    stenting x 2  . Colon resection  2008    Family History  Problem Relation Age of Onset  . Cervical cancer Mother   . Rectal cancer Father   . Uterine cancer Sister     Social History:  reports that he has quit smoking. His smoking use included Cigarettes and Cigars. He smoked 0.00 packs per day. He has quit using smokeless tobacco. His smokeless tobacco use included Chew. He reports that he does not drink alcohol or use illicit drugs.  Allergies:  Allergies  Allergen Reactions  . Daypro [Oxaprozin] Nausea And Vomiting    Medications: I have reviewed the patient's current medications.  Results for orders placed during the hospital encounter of 04/30/13 (from the past 48 hour(s))  CBC WITH DIFFERENTIAL     Status: Abnormal   Collection Time    04/30/13 10:36 PM      Result Value Range   WBC 5.8  4.0 - 10.5 K/uL   RBC 3.68 (*) 4.22 - 5.81 MIL/uL   Hemoglobin 10.9 (*) 13.0 - 17.0 g/dL   HCT 16.1 (*) 09.6 - 04.5 %   MCV 90.8  78.0 - 100.0 fL   MCH 29.6  26.0 - 34.0 pg   MCHC 32.6  30.0 - 36.0 g/dL   RDW 40.9  81.1 - 91.4 %  Platelets 61 (*) 150 - 400 K/uL   Neutrophils Relative % 79 (*) 43 - 77 %   Lymphocytes Relative 9 (*) 12 - 46 %   Monocytes Relative 10  3 - 12 %   Eosinophils Relative 2  0 - 5 %   Basophils Relative 0  0 - 1 %   Neutro Abs 4.6  1.7 - 7.7 K/uL   Lymphs Abs 0.5 (*) 0.7 - 4.0 K/uL   Monocytes Absolute 0.6  0.1 - 1.0 K/uL   Eosinophils Absolute 0.1  0.0 - 0.7 K/uL   Basophils Absolute 0.0  0.0 - 0.1 K/uL   Smear Review PLATELET COUNT CONFIRMED BY SMEAR     Comment: LARGE PLATELETS PRESENT  BASIC METABOLIC PANEL     Status: Abnormal   Collection Time    04/30/13 10:36 PM      Result Value Range   Sodium 138  135 - 145 mEq/L   Potassium 3.6  3.5 - 5.1 mEq/L   Chloride 104  96 - 112 mEq/L   CO2 23  19 - 32 mEq/L   Glucose, Bld 145 (*) 70 - 99 mg/dL   BUN 16  6 - 23 mg/dL   Creatinine, Ser  4.09 (*) 0.50 - 1.35 mg/dL   Calcium 9.1  8.4 - 81.1 mg/dL   GFR calc non Af Amer 22 (*) >90 mL/min   GFR calc Af Amer 26 (*) >90 mL/min   Comment: (NOTE)     The eGFR has been calculated using the CKD EPI equation.     This calculation has not been validated in all clinical situations.     eGFR's persistently <90 mL/min signify possible Chronic Kidney     Disease.  TYPE AND SCREEN     Status: None   Collection Time    04/30/13 10:42 PM      Result Value Range   ABO/RH(D) A POS     Antibody Screen NEG     Sample Expiration 05/03/2013    APTT     Status: None   Collection Time    04/30/13 11:20 PM      Result Value Range   aPTT 34  24 - 37 seconds  PROTIME-INR     Status: None   Collection Time    04/30/13 11:20 PM      Result Value Range   Prothrombin Time 14.9  11.6 - 15.2 seconds   INR 1.20  0.00 - 1.49  URINALYSIS, ROUTINE W REFLEX MICROSCOPIC     Status: Abnormal   Collection Time    05/01/13 12:38 AM      Result Value Range   Color, Urine BROWN (*) YELLOW   Comment: BIOCHEMICALS MAY BE AFFECTED BY COLOR   APPearance CLOUDY (*) CLEAR   Specific Gravity, Urine >1.030 (*) 1.005 - 1.030   pH 5.5  5.0 - 8.0   Glucose, UA NEGATIVE  NEGATIVE mg/dL   Hgb urine dipstick LARGE (*) NEGATIVE   Bilirubin Urine NEGATIVE  NEGATIVE   Ketones, ur TRACE (*) NEGATIVE mg/dL   Protein, ur 914 (*) NEGATIVE mg/dL   Urobilinogen, UA 1.0  0.0 - 1.0 mg/dL   Nitrite POSITIVE (*) NEGATIVE   Leukocytes, UA TRACE (*) NEGATIVE  URINE MICROSCOPIC-ADD ON     Status: Abnormal   Collection Time    05/01/13 12:38 AM      Result Value Range   Squamous Epithelial / LPF RARE  RARE   RBC / HPF  TOO NUMEROUS TO COUNT  <3 RBC/hpf   Bacteria, UA MANY (*) RARE  OCCULT BLOOD, POC DEVICE     Status: Abnormal   Collection Time    05/01/13  4:26 AM      Result Value Range   Fecal Occult Bld POSITIVE (*) NEGATIVE  CBC     Status: Abnormal   Collection Time    05/01/13  6:42 AM      Result Value Range    WBC 6.3  4.0 - 10.5 K/uL   RBC 3.30 (*) 4.22 - 5.81 MIL/uL   Hemoglobin 10.0 (*) 13.0 - 17.0 g/dL   HCT 78.4 (*) 69.6 - 29.5 %   MCV 90.3  78.0 - 100.0 fL   MCH 30.3  26.0 - 34.0 pg   MCHC 33.6  30.0 - 36.0 g/dL   RDW 28.4  13.2 - 44.0 %   Platelets 57 (*) 150 - 400 K/uL   Comment: CONSISTENT WITH PREVIOUS RESULT  BASIC METABOLIC PANEL     Status: Abnormal   Collection Time    05/01/13  6:42 AM      Result Value Range   Sodium 140  135 - 145 mEq/L   Potassium 3.7  3.5 - 5.1 mEq/L   Chloride 109  96 - 112 mEq/L   CO2 22  19 - 32 mEq/L   Glucose, Bld 113 (*) 70 - 99 mg/dL   BUN 17  6 - 23 mg/dL   Creatinine, Ser 1.02 (*) 0.50 - 1.35 mg/dL   Calcium 8.7  8.4 - 72.5 mg/dL   GFR calc non Af Amer 23 (*) >90 mL/min   GFR calc Af Amer 27 (*) >90 mL/min   Comment: (NOTE)     The eGFR has been calculated using the CKD EPI equation.     This calculation has not been validated in all clinical situations.     eGFR's persistently <90 mL/min signify possible Chronic Kidney     Disease.  GLUCOSE, CAPILLARY     Status: Abnormal   Collection Time    05/01/13  7:45 AM      Result Value Range   Glucose-Capillary 105 (*) 70 - 99 mg/dL    No results found.  ROS Blood pressure 105/69, pulse 71, temperature 98.7 F (37.1 C), temperature source Oral, resp. rate 20, height 5\' 10"  (1.778 m), weight 214 lb 6.4 oz (97.251 kg), SpO2 97.00%. Physical Exam Alert and oriented. Skin warm and dry. Oral mucosa is moist.   . Sclera anicteric, conjunctivae is pink. Thyroid not enlarged. No cervical lymphadenopathy. Lungs clear. Heart regular rate and rhythm.  Abdomen is soft. Bowel sounds are positive. No hepatomegaly. No abdominal masses felt. No tenderness.  No edema to lower extremities .  Assessment/Plan: Bleeding from ileostomy site. There is no bleeding at this time. I will discuss with Dr. Karilyn Cota.  He has had a slight drop in his hemoglobin from 10.9 to 10.0.  SETZER,TERRI W 05/01/2013, 11:27 AM      GI attending note; Patient interviewed and examined. Lab studies reviewed as well as chest and abdominopelvic CT from last month. Patient has history of metastatic colon carcinoma but he is in remission. He was initially diagnosed in 2008 and received radiation therapy to pelvic region in 2009. He also has received radiation therapy to his right lung and recently discovered to have 4.5 mm right lung nodule which is being observed. He now presents with bleeding via ileostomy. First episode occurred on the  night of 04/27/2013 and resolve spontaneously. He began to pass fresh blood and ileostomy last night and he also passed large clot. He has not passed any blood in more than 12 hours. He denies abdominal pain nausea or vomiting. He took 2 tablets of Aleve 3-4 weeks ago but he took 22 days ago and 2 yesterday. His abdominal examination is within normal limits. Suspect GI bleed possibly secondary to NSAID-induced small bowel injury that he could also have radiation enteritis. He would be best studied with small bowel given capsule. Ileoscopy could be performed at would be of limited use unless bleeding site is located within 2-3 feet of ileostomy. Dr. Jena Gauss, patient's primary gastroenterologist will be seeing patient in a.m and if he would agree small bowel given capsule study will be undertaken in a.m.

## 2013-05-01 NOTE — Progress Notes (Signed)
Utilization Review Complete  

## 2013-05-02 ENCOUNTER — Encounter (HOSPITAL_COMMUNITY): Payer: Self-pay

## 2013-05-02 ENCOUNTER — Encounter (HOSPITAL_COMMUNITY)
Admission: EM | Disposition: A | Discharge status: Home / Self Care | Payer: Self-pay | Source: Home / Self Care | Attending: Internal Medicine

## 2013-05-02 ENCOUNTER — Encounter (HOSPITAL_COMMUNITY): Payer: Self-pay | Admitting: *Deleted

## 2013-05-02 DIAGNOSIS — D649 Anemia, unspecified: Secondary | ICD-10-CM

## 2013-05-02 DIAGNOSIS — D696 Thrombocytopenia, unspecified: Secondary | ICD-10-CM

## 2013-05-02 DIAGNOSIS — D131 Benign neoplasm of stomach: Secondary | ICD-10-CM

## 2013-05-02 DIAGNOSIS — K922 Gastrointestinal hemorrhage, unspecified: Secondary | ICD-10-CM

## 2013-05-02 HISTORY — PX: GIVENS CAPSULE STUDY: SHX5432

## 2013-05-02 HISTORY — PX: ESOPHAGOGASTRODUODENOSCOPY: SHX5428

## 2013-05-02 LAB — GLUCOSE, CAPILLARY
Glucose-Capillary: 130 mg/dL — ABNORMAL HIGH (ref 70–99)
Glucose-Capillary: 73 mg/dL (ref 70–99)
Glucose-Capillary: 75 mg/dL (ref 70–99)
Glucose-Capillary: 80 mg/dL (ref 70–99)
Glucose-Capillary: 83 mg/dL (ref 70–99)
Glucose-Capillary: 85 mg/dL (ref 70–99)

## 2013-05-02 LAB — BASIC METABOLIC PANEL
BUN: 15 mg/dL (ref 6–23)
CO2: 20 mEq/L (ref 19–32)
Chloride: 110 mEq/L (ref 96–112)
Creatinine, Ser: 2.36 mg/dL — ABNORMAL HIGH (ref 0.50–1.35)
GFR calc Af Amer: 33 mL/min — ABNORMAL LOW (ref >90)
Glucose, Bld: 78 mg/dL (ref 70–99)
Potassium: 3.7 mEq/L (ref 3.5–5.1)

## 2013-05-02 LAB — CBC
HCT: 25.5 % — ABNORMAL LOW (ref 39.0–52.0)
Hemoglobin: 8.4 g/dL — ABNORMAL LOW (ref 13.0–17.0)
MCH: 30 pg (ref 26.0–34.0)
MCHC: 32.9 g/dL (ref 30.0–36.0)
MCV: 91.1 fL (ref 78.0–100.0)
Platelets: 52 10*3/uL — ABNORMAL LOW (ref 150–400)
RDW: 14.6 % (ref 11.5–15.5)

## 2013-05-02 SURGERY — EGD (ESOPHAGOGASTRODUODENOSCOPY)
Anesthesia: Moderate Sedation

## 2013-05-02 MED ORDER — SODIUM CHLORIDE 0.9 % IV SOLN: INTRAVENOUS | Status: DC

## 2013-05-02 MED ORDER — MIDAZOLAM HCL 5 MG/5ML IJ SOLN
INTRAMUSCULAR | Status: DC | PRN
  Administered 2013-05-02: 2 mg via INTRAVENOUS
  Administered 2013-05-02 (×2): 1 mg via INTRAVENOUS
  Administered 2013-05-02: 2 mg via INTRAVENOUS

## 2013-05-02 MED ORDER — MIDAZOLAM HCL 5 MG/5ML IJ SOLN
INTRAMUSCULAR | Status: AC
  Filled 2013-05-02: qty 10

## 2013-05-02 MED ORDER — ONDANSETRON HCL 4 MG/2ML IJ SOLN
INTRAMUSCULAR | Status: AC
  Filled 2013-05-02: qty 2

## 2013-05-02 MED ORDER — ONDANSETRON HCL 4 MG/2ML IJ SOLN
INTRAMUSCULAR | Status: DC | PRN
  Administered 2013-05-02: 4 mg via INTRAVENOUS

## 2013-05-02 MED ORDER — BUTAMBEN-TETRACAINE-BENZOCAINE 2-2-14 % EX AERO
INHALATION_SPRAY | CUTANEOUS | Status: DC | PRN
  Administered 2013-05-02: 2 via TOPICAL

## 2013-05-02 MED ORDER — MEPERIDINE HCL 100 MG/ML IJ SOLN
INTRAMUSCULAR | Status: AC
  Filled 2013-05-02: qty 2

## 2013-05-02 MED ORDER — MEPERIDINE HCL 100 MG/ML IJ SOLN
INTRAMUSCULAR | Status: DC | PRN
Start: 2013-05-02 — End: 2013-05-02
  Administered 2013-05-02 (×2): 50 mg via INTRAVENOUS
  Administered 2013-05-02: 25 mg via INTRAVENOUS

## 2013-05-02 NOTE — Progress Notes (Addendum)
Subjective:  No further bleeding noted since admission. ?melena prior to developing fresh blood which started on Saturday. No abdominal pain. No heartburn. Prior heavy etoh use years ago. Splenomegaly and thrombocytopenia.   Objective: Vital signs in last 24 hours: Temp:  [97.4 F (36.3 C)-98.7 F (37.1 C)] 98.3 F (36.8 C) (12/04 0400) Pulse Rate:  [67-73] 70 (12/04 0400) Resp:  [18-20] 20 (12/04 0400) BP: (105-132)/(61-82) 124/61 mmHg (12/04 0400) SpO2:  [97 %-100 %] 100 % (12/04 0400) Weight:  [219 lb 11.2 oz (99.655 kg)] 219 lb 11.2 oz (99.655 kg) (12/04 0400) Last BM Date: 05/01/13 General:   Alert,  Well-developed, well-nourished, pleasant and cooperative in NAD Head:  Normocephalic and atraumatic. Eyes:  Sclera clear, no icterus.   Abdomen:  Soft, nontender and nondistended.  Normal bowel sounds, without guarding, and without rebound.  Ostomy with brown liquid present. Extremities:  Without clubbing, deformity or edema. Neurologic:  Alert and  oriented x4;  grossly normal neurologically. Skin:  Intact without significant lesions or rashes. Psych:  Alert and cooperative. Normal mood and affect.  Intake/Output from previous day: 12/03 0701 - 12/04 0700 In: 360 [P.O.:360] Out: -  Intake/Output this shift:    Lab Results: CBC  Recent Labs  04/30/13 2236 05/01/13 0642 05/02/13 0459  WBC 5.8 6.3 3.7*  HGB 10.9* 10.0* 8.4*  HCT 33.4* 29.8* 25.5*  MCV 90.8 90.3 91.1  PLT 61* 57* 52*   BMET  Recent Labs  04/30/13 2236 05/01/13 0642 05/02/13 0459  NA 138 140 140  K 3.6 3.7 3.7  CL 104 109 110  CO2 23 22 20   GLUCOSE 145* 113* 78  BUN 16 17 15   CREATININE 2.89* 2.78* 2.36*  CALCIUM 9.1 8.7 8.6   PT/INR  Recent Labs  04/30/13 2320  LABPROT 14.9  INR 1.20      Imaging Studies: Ct Chest W Contrast  04/18/2013   CLINICAL DATA:  Metastatic rectal carcinoma  EXAM: CT CHEST, ABDOMEN, AND PELVIS WITH CONTRAST  TECHNIQUE: Multidetector CT imaging of the  chest, abdomen and pelvis was performed following the standard protocol during bolus administration of intravenous contrast.  CONTRAST:  OMNIPAQUE IOHEXOL 300 MG/ML  SOLN  COMPARISON:  01/14/2013  FINDINGS: CT CHEST FINDINGS  Interval near resolution of small right pleural effusion. Stable scarring/radiation change within the right upper lobe, image 15/series 3. There is a new pulmonary nodule in the right apex measuring 5 mm, image 15/series 3.  The trachea appears patent and is midline. There is mild cardiac enlargement. No pericardial effusion. Prominent calcifications are noted involving the LAD and left circumflex coronary artery.  There is mild multilevel spondylosis identified within the thoracic spine. No aggressive lytic or sclerotic bone lesion identified. Mild compression deformity involving the T11 vertebra. This is unchanged from previous exam.  CT ABDOMEN AND PELVIS FINDINGS  Appearance of Previous cholecystectomy. Normal appearance of the pancreas. There is no biliary dilatation. Spleen is enlarged measuring 14 cm in length. The adrenal glands are both normal. Bilateral renal cysts are identified. Nonobstructing calculus within the inferior pole of the right kidney measures 6 mm, image [name]/series 2. Within the inferior pole of the left kidney there is a 7 mm stone, image number 74/series 2. There is left-sided pelvocaliectasis. Within the left renal pelvis there are 2 large stones which measure up to 1.3 cm. The urinary bladder appears normal. Prostate gland and seminal vesicles are unremarkable.  Normal caliber of the abdominal aorta. There is mild calcified atherosclerotic  disease noted. No upper abdominal adenopathy identified no enlarged pelvic lymph nodes.  Stomach is normal. The small bowel loops have a normal caliber without evidence to suggest bowel obstruction. Subtotal colectomy has been performed. There is a right lower quadrant ileostomy which contains a peristomal hernia with  nonobstructed loops of small bowel. Similar appearance of circumferential thickening of the wall of rectum, image 107/series 2. Next  Review of the visualized osseous structures is significant for lumbar spondylosis. No aggressive lytic or sclerotic bone lesions identified. Nonaggressive appearing sclerotic structure within the left iliac bone is stable from previous exam, image number 85/series 2.  IMPRESSION: 1. New suspicious nodule within the right lung apex in the area of presumed radiation change. 2. No focal liver lesions are identified on today's examination to suggest recurrence of liver metastases. The appearance of the abdomen pelvis is essentially stable.   Electronically Signed   By: Signa Kell M.D.   On: 04/18/2013 10:46   Ct Abdomen Pelvis W Contrast  04/18/2013   CLINICAL DATA:  Metastatic rectal carcinoma  EXAM: CT CHEST, ABDOMEN, AND PELVIS WITH CONTRAST  TECHNIQUE: Multidetector CT imaging of the chest, abdomen and pelvis was performed following the standard protocol during bolus administration of intravenous contrast.  CONTRAST:  OMNIPAQUE IOHEXOL 300 MG/ML  SOLN  COMPARISON:  01/14/2013  FINDINGS: CT CHEST FINDINGS  Interval near resolution of small right pleural effusion. Stable scarring/radiation change within the right upper lobe, image 15/series 3. There is a new pulmonary nodule in the right apex measuring 5 mm, image 15/series 3.  The trachea appears patent and is midline. There is mild cardiac enlargement. No pericardial effusion. Prominent calcifications are noted involving the LAD and left circumflex coronary artery.  There is mild multilevel spondylosis identified within the thoracic spine. No aggressive lytic or sclerotic bone lesion identified. Mild compression deformity involving the T11 vertebra. This is unchanged from previous exam.  CT ABDOMEN AND PELVIS FINDINGS  Appearance of Previous cholecystectomy. Normal appearance of the pancreas. There is no biliary  dilatation. Spleen is enlarged measuring 14 cm in length. The adrenal glands are both normal. Bilateral renal cysts are identified. Nonobstructing calculus within the inferior pole of the right kidney measures 6 mm, image [name]/series 2. Within the inferior pole of the left kidney there is a 7 mm stone, image number 74/series 2. There is left-sided pelvocaliectasis. Within the left renal pelvis there are 2 large stones which measure up to 1.3 cm. The urinary bladder appears normal. Prostate gland and seminal vesicles are unremarkable.  Normal caliber of the abdominal aorta. There is mild calcified atherosclerotic disease noted. No upper abdominal adenopathy identified no enlarged pelvic lymph nodes.  Stomach is normal. The small bowel loops have a normal caliber without evidence to suggest bowel obstruction. Subtotal colectomy has been performed. There is a right lower quadrant ileostomy which contains a peristomal hernia with nonobstructed loops of small bowel. Similar appearance of circumferential thickening of the wall of rectum, image 107/series 2. Next  Review of the visualized osseous structures is significant for lumbar spondylosis. No aggressive lytic or sclerotic bone lesions identified. Nonaggressive appearing sclerotic structure within the left iliac bone is stable from previous exam, image number 85/series 2.  IMPRESSION: 1. New suspicious nodule within the right lung apex in the area of presumed radiation change. 2. No focal liver lesions are identified on today's examination to suggest recurrence of liver metastases. The appearance of the abdomen pelvis is essentially stable.   Electronically Signed  By: Signa Kell M.D.   On: 04/18/2013 10:46      Assessment: 62 y/o [name] with history of metastatic colon carcinoma initially diagnosed in 2008 and received radiation therapy to pelvic region in 2009. 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 Presents with GI bleed. ?initially  melena followed by fresh blood and blood clot noted into the ostomy bag. Bleeding initially started Saturday but stopped on its own. Further bleeding noted Tuesday. Some recent minimal Aleve use. H/O splenomegaly and thrombocytopenia, prior heavy etoh use but no cirrhosis on imaging studies.Hgb 12.1 three months ago, 10.7 three weeks ago. 10.9 on admission but down to 8.4 today.  Ddx includes bleeding secondary to PUD, varices, SB radiation induced injury, NSAID injury.   Plan: EGD followed by SB capsule endoscopy if EGD negative. Planned for later today. NPO except for medications.    LOS: 2 days   Tana Coast  05/02/2013, 8:10 AM   Patient seen and examined in short stay. Agree with plans for EGD with possible subsequent capsule placement. Reviewed risk, benefits, limitations with patient. Questions answered;  He his agreeable.

## 2013-05-02 NOTE — Progress Notes (Signed)
PATIENT DETAILS Name: [name] Age: 62 y.o. Sex: male Date of Birth: 05/17/1951 Admit Date: 04/30/2013 Admitting Physician Vania Rea, MD AVW:[id number], Chancy Hurter, MD  Subjective: Admitted with blood in his colostomy. No further blood seen since admission.  Assessment/Plan: Active Problems: Bleeding from ileostomy site - History of rectal cancer status post subtotal colectomy with ileostomy - Suspect GI bleed - GI evaluation for endoscopic evaluation obtained, EGD later today. If negative, GI recommending a small bowel capsule endoscopy.  Acute renal failure - Continue with hydration. Patient looks almost euvolemic. Avoid nephrotoxic agents. - Creatinine now down trending. - Renal ultrasound on 12/3 showed bilateral renal cysts, bilateral nonobstructing renal calculi however there was no hydronephrosis. - Suspect this could be secondary to GI bleed.  Anemia - Hemoglobin dropped today, suspect this secondary to dilution. No bleeding since admission-48 hours. Suspect anemia to be chronic from underlying chronic disease from  Malignancy.  Thrombocytopenia - Chronic. Monitor platelets closely.  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 - Reviewed outpatient radiology oncology notes and medical oncology notes. Per medical oncology note, patient is a poor candidate for further chemotherapy given his pancytopenia.  ? History of diabetes - CBGs stable - Cautiously continue with SSI  Disposition: Remain inpatient- till GI workup complete.  DVT Prophylaxis:  SCD's  Code Status: Full code   Family Communication Multiple family members at bedside this morning.  Procedures:  None  CONSULTS:  GI  Time spent 20 minutes   MEDICATIONS: Scheduled Meds: . insulin aspart  0-9 Units Subcutaneous Q4H  . sodium chloride  3 mL Intravenous Q12H   Continuous Infusions: . sodium chloride 100 mL/hr at 05/02/13 0912   PRN  Meds:.ondansetron (ZOFRAN) IV  Antibiotics: Anti-infectives   None       PHYSICAL EXAM: Vital signs in last 24 hours: Filed Vitals:   05/01/13 1326 05/01/13 1745 05/01/13 2145 05/02/13 0400  BP: 132/82 118/79 131/78 124/61  Pulse: 73 67 68 70  Temp: 97.4 F (36.3 C) 98.3 F (36.8 C) 98.7 F (37.1 C) 98.3 F (36.8 C)  TempSrc: Axillary Oral Oral Oral  Resp: 18 18 20 20   Height:      Weight:    99.655 kg (219 lb 11.2 oz)  SpO2: 99% 100% 100% 100%    Weight change: -2.858 kg (-6 lb 4.8 oz) Filed Weights   04/30/13 2135 05/01/13 0235 05/02/13 0400  Weight: 102.513 kg (226 lb) 97.251 kg (214 lb 6.4 oz) 99.655 kg (219 lb 11.2 oz)   Body mass index is 31.52 kg/(m^2).   Gen Exam: Awake and alert with clear speech.   Neck: Supple, No JVD.   Chest: B/L Clear.  No rales/rhonchi CVS: S1 S2 Regular, no murmurs.  Abdomen: soft, BS +, non tender, non distended. Ileostomy in place, no blood seen, brown stools present. Extremities: no edema, lower extremities warm to touch Neurologic: Non Focal.  Ambulating in the room Skin: No Rash.  Wounds: N/A.    Intake/Output from previous day:  Intake/Output Summary (Last 24 hours) at 05/02/13 1106 Last data filed at 05/01/13 1844  Gross per 24 hour  Intake    360 ml  Output      0 ml  Net    360 ml     LAB RESULTS: CBC  Recent Labs Lab 04/30/13 2236 05/01/13 0642 05/02/13 0459  WBC 5.8 6.3 3.7*  HGB 10.9* 10.0* 8.4*  HCT 33.4* 29.8* 25.5*  PLT 61* 57* 52*  MCV  90.8 90.3 91.1  MCH 29.6 30.3 30.0  MCHC 32.6 33.6 32.9  RDW 14.6 14.5 14.6  LYMPHSABS 0.5*  --   --   MONOABS 0.6  --   --   EOSABS 0.1  --   --   BASOSABS 0.0  --   --     Chemistries   Recent Labs Lab 04/30/13 2236 05/01/13 0642 05/02/13 0459  NA 138 140 140  K 3.6 3.7 3.7  CL 104 109 110  CO2 23 22 20   GLUCOSE 145* 113* 78  BUN 16 17 15   CREATININE 2.89* 2.78* 2.36*  CALCIUM 9.1 8.7 8.6    CBG:  Recent Labs Lab 05/01/13 1751  05/01/13 2003 05/02/13 0005 05/02/13 0412 05/02/13 0719  GLUCAP 112* 101* 83 85 75    GFR Estimated Creatinine Clearance: 39.4 ml/min (by C-G formula based on Cr of 2.36).  Coagulation profile  Recent Labs Lab 04/30/13 2320  INR 1.20    Cardiac Enzymes No results found for this basename: CK, CKMB, TROPONINI, MYOGLOBIN,  in the last 168 hours  No components found with this basename: POCBNP,  No results found for this basename: DDIMER,  in the last 72 hours  Recent Labs  05/01/13 0647  HGBA1C 4.9   No results found for this basename: CHOL, HDL, LDLCALC, TRIG, CHOLHDL, LDLDIRECT,  in the last 72 hours No results found for this basename: TSH, T4TOTAL, FREET3, T3FREE, THYROIDAB,  in the last 72 hours No results found for this basename: VITAMINB12, FOLATE, FERRITIN, TIBC, IRON, RETICCTPCT,  in the last 72 hours No results found for this basename: LIPASE, AMYLASE,  in the last 72 hours  Urine Studies No results found for this basename: UACOL, UAPR, USPG, UPH, UTP, UGL, UKET, UBIL, UHGB, UNIT, UROB, ULEU, UEPI, UWBC, URBC, UBAC, CAST, CRYS, UCOM, BILUA,  in the last 72 hours  MICROBIOLOGY: No results found for this or any previous visit (from the past 240 hour(s)).  RADIOLOGY STUDIES/RESULTS: Ct Chest W Contrast  04/18/2013   CLINICAL DATA:  Metastatic rectal carcinoma  EXAM: CT CHEST, ABDOMEN, AND PELVIS WITH CONTRAST  TECHNIQUE: Multidetector CT imaging of the chest, abdomen and pelvis was performed following the standard protocol during bolus administration of intravenous contrast.  CONTRAST:  OMNIPAQUE IOHEXOL 300 MG/ML  SOLN  COMPARISON:  01/14/2013  FINDINGS: CT CHEST FINDINGS  Interval near resolution of small right pleural effusion. Stable scarring/radiation change within the right upper lobe, image 15/series 3. There is a new pulmonary nodule in the right apex measuring 5 mm, image 15/series 3.  The trachea appears patent and is midline. There is mild cardiac  enlargement. No pericardial effusion. Prominent calcifications are noted involving the LAD and left circumflex coronary artery.  There is mild multilevel spondylosis identified within the thoracic spine. No aggressive lytic or sclerotic bone lesion identified. Mild compression deformity involving the T11 vertebra. This is unchanged from previous exam.  CT ABDOMEN AND PELVIS FINDINGS  Appearance of Previous cholecystectomy. Normal appearance of the pancreas. There is no biliary dilatation. Spleen is enlarged measuring 14 cm in length. The adrenal glands are both normal. Bilateral renal cysts are identified. Nonobstructing calculus within the inferior pole of the right kidney measures 6 mm, image 65/series 2. Within the inferior pole of the left kidney there is a 7 mm stone, image number 74/series 2. There is left-sided pelvocaliectasis. Within the left renal pelvis there are 2 large stones which measure up to 1.3 cm. The urinary bladder appears normal.  Prostate gland and seminal vesicles are unremarkable.  Normal caliber of the abdominal aorta. There is mild calcified atherosclerotic disease noted. No upper abdominal adenopathy identified no enlarged pelvic lymph nodes.  Stomach is normal. The small bowel loops have a normal caliber without evidence to suggest bowel obstruction. Subtotal colectomy has been performed. There is a right lower quadrant ileostomy which contains a peristomal hernia with nonobstructed loops of small bowel. Similar appearance of circumferential thickening of the wall of rectum, image 107/series 2. Next  Review of the visualized osseous structures is significant for lumbar spondylosis. No aggressive lytic or sclerotic bone lesions identified. Nonaggressive appearing sclerotic structure within the left iliac bone is stable from previous exam, image number 85/series 2.  IMPRESSION: 1. New suspicious nodule within the right lung apex in the area of presumed radiation change. 2. No focal liver  lesions are identified on today's examination to suggest recurrence of liver metastases. The appearance of the abdomen pelvis is essentially stable.   Electronically Signed   By: Signa Kell M.D.   On: 04/18/2013 10:46   Ct Abdomen Pelvis W Contrast  04/18/2013   CLINICAL DATA:  Metastatic rectal carcinoma  EXAM: CT CHEST, ABDOMEN, AND PELVIS WITH CONTRAST  TECHNIQUE: Multidetector CT imaging of the chest, abdomen and pelvis was performed following the standard protocol during bolus administration of intravenous contrast.  CONTRAST:  OMNIPAQUE IOHEXOL 300 MG/ML  SOLN  COMPARISON:  01/14/2013  FINDINGS: CT CHEST FINDINGS  Interval near resolution of small right pleural effusion. Stable scarring/radiation change within the right upper lobe, image 15/series 3. There is a new pulmonary nodule in the right apex measuring 5 mm, image 15/series 3.  The trachea appears patent and is midline. There is mild cardiac enlargement. No pericardial effusion. Prominent calcifications are noted involving the LAD and left circumflex coronary artery.  There is mild multilevel spondylosis identified within the thoracic spine. No aggressive lytic or sclerotic bone lesion identified. Mild compression deformity involving the T11 vertebra. This is unchanged from previous exam.  CT ABDOMEN AND PELVIS FINDINGS  Appearance of Previous cholecystectomy. Normal appearance of the pancreas. There is no biliary dilatation. Spleen is enlarged measuring 14 cm in length. The adrenal glands are both normal. Bilateral renal cysts are identified. Nonobstructing calculus within the inferior pole of the right kidney measures 6 mm, image 65/series 2. Within the inferior pole of the left kidney there is a 7 mm stone, image number 74/series 2. There is left-sided pelvocaliectasis. Within the left renal pelvis there are 2 large stones which measure up to 1.3 cm. The urinary bladder appears normal. Prostate gland and seminal vesicles are  unremarkable.  Normal caliber of the abdominal aorta. There is mild calcified atherosclerotic disease noted. No upper abdominal adenopathy identified no enlarged pelvic lymph nodes.  Stomach is normal. The small bowel loops have a normal caliber without evidence to suggest bowel obstruction. Subtotal colectomy has been performed. There is a right lower quadrant ileostomy which contains a peristomal hernia with nonobstructed loops of small bowel. Similar appearance of circumferential thickening of the wall of rectum, image 107/series 2. Next  Review of the visualized osseous structures is significant for lumbar spondylosis. No aggressive lytic or sclerotic bone lesions identified. Nonaggressive appearing sclerotic structure within the left iliac bone is stable from previous exam, image number 85/series 2.  IMPRESSION: 1. New suspicious nodule within the right lung apex in the area of presumed radiation change. 2. No focal liver lesions are identified on today's examination  to suggest recurrence of liver metastases. The appearance of the abdomen pelvis is essentially stable.   Electronically Signed   By: Signa Kell M.D.   On: 04/18/2013 10:46    Jeoffrey Massed, MD  [facility] Pager:[phone]  If 7PM-7AM, please contact night-coverage [email or web address] Password [email or web address] 05/02/2013, 11:06 AM   LOS: 2 days

## 2013-05-02 NOTE — Op Note (Signed)
[facility] [address] [place], [address]   ENDOSCOPY PROCEDURE REPORT  PATIENT: [name], [name]  MR#: [id number] BIRTHDATE: [birth year] , 62  yrs. old GENDER: Male ENDOSCOPIST: R.  Roetta Sessions, MD FACP FACG REFERRED BY:  Assunta Found, M.D.  Glenford Peers, M.D. PROCEDURE DATE:  05/02/2013 PROCEDURE:     EGD with gastric biopsy followed by capsule placement into the stomach  INDICATIONS:     bleeding through ileostomy  INFORMED CONSENT:   The risks, benefits, limitations, alternatives and imponderables have been discussed.  The potential for biopsy, esophogeal dilation, etc. have also been reviewed.  Questions have been answered.  All parties agreeable.  Please see the history and physical in the medical record for more information.  MEDICATIONS:    Versed 6 mg IV and Demerol 125 mg IV in divided doses.  Zofran 4 mg IV. Cetacaine spray  DESCRIPTION OF PROCEDURE:   The IO-[id number] ([id number])  endoscope was introduced through the mouth and advanced to the second portion of the duodenum without difficulty or limitations.  The mucosal surfaces were surveyed very carefully during advancement of the scope and upon withdrawal.  Retroflexion view of the proximal stomach and esophagogastric junction was performed.      FINDINGS:   Normal esophagus. No varices. Stomach empty. Small hiatal hernia. The patient had a 1.8 cm area of polypoid fold in the antrum. Please see above photographs-image 4. The overlying mucosa appeared normal.  Pylorus was patent. The bulb and second portion revealed no abnormalities.  THERAPEUTIC / DIAGNOSTIC MANEUVERS PERFORMED:   scope was withdrawn and the small bowel capsule deployment device was loaded with the capsule.   I reintroduced the scope into the stomach;  the antrum was somewhat offset from the body of the stomach. I was unable to every line up properly to get the capsule to the mouth the pyloric channel. I tried multiple  times.   pyloric channel  was somewhat friable  - there was some bleeding related to multiple attempts. I elected to go ahead and deployed the capsule in the stomach. Subsequently, I used a Roth net ,trapezoid basket aned and snare to attempt to engage the capsule and delivery itrough the pyloric channel. After multiple attempts, I was ultimately unsuccessful. Finally, biopsies of the abnormal anrum taken.   COMPLICATIONS:  None  IMPRESSION:  Polypoid antral fold-status post biopsy.  Small bowel capsule deployed in the stomach but was unable to deploy it across the pyloric channel as described above.  RECOMMENDATIONS:  Ambulate as soon as feasible. The post capsule deployment protocol. Hopefully, capsule will traverse the pyloric channel. There will be some blood in the stomach and proximal duodenum from biopsy and friability of the pyloric channel. Hopefully, we will still be able to get useful images of the more distal small intestine.  Findings and recommendations discussed with the family. Followup of pathology.    _______________________________ R. Roetta Sessions, MD FACP [facility] eSigned:  R. Roetta Sessions, MD FACP [facility] 05/02/2013 5:19 PM     CC:  PATIENT NAME:  Maher, Shon MR#: [id number]

## 2013-05-03 DIAGNOSIS — K922 Gastrointestinal hemorrhage, unspecified: Secondary | ICD-10-CM

## 2013-05-03 DIAGNOSIS — K6389 Other specified diseases of intestine: Secondary | ICD-10-CM

## 2013-05-03 DIAGNOSIS — D61818 Other pancytopenia: Secondary | ICD-10-CM

## 2013-05-03 LAB — GLUCOSE, CAPILLARY
Glucose-Capillary: 73 mg/dL (ref 70–99)
Glucose-Capillary: 93 mg/dL (ref 70–99)

## 2013-05-03 LAB — CBC
HCT: 25.6 % — ABNORMAL LOW (ref 39.0–52.0)
Hemoglobin: 8.6 g/dL — ABNORMAL LOW (ref 13.0–17.0)
MCH: 30.3 pg (ref 26.0–34.0)
MCHC: 32.9 g/dL (ref 30.0–36.0)
MCHC: 33.6 g/dL (ref 30.0–36.0)
MCV: 90.1 fL (ref 78.0–100.0)
Platelets: 41 10*3/uL — ABNORMAL LOW (ref 150–400)
Platelets: 46 10*3/uL — ABNORMAL LOW (ref 150–400)
RBC: 2.57 MIL/uL — ABNORMAL LOW (ref 4.22–5.81)
RDW: 14.4 % (ref 11.5–15.5)
WBC: 2.7 10*3/uL — ABNORMAL LOW (ref 4.0–10.5)

## 2013-05-03 LAB — BASIC METABOLIC PANEL
BUN: 13 mg/dL (ref 6–23)
Chloride: 110 mEq/L (ref 96–112)
GFR calc Af Amer: 40 mL/min — ABNORMAL LOW (ref >90)
GFR calc non Af Amer: 35 mL/min — ABNORMAL LOW (ref >90)
Potassium: 3.6 mEq/L (ref 3.5–5.1)
Sodium: 140 mEq/L (ref 135–145)

## 2013-05-03 LAB — PREPARE RBC (CROSSMATCH)

## 2013-05-03 LAB — ABO/RH: ABO/RH(D): A POS

## 2013-05-03 MED ORDER — ACETAMINOPHEN 325 MG PO TABS
650.0000 mg | ORAL_TABLET | Freq: Once | ORAL | Status: AC
  Administered 2013-05-03: 650 mg via ORAL
  Filled 2013-05-03: qty 2

## 2013-05-03 MED ORDER — HEPARIN SOD (PORK) LOCK FLUSH 100 UNIT/ML IV SOLN
500.0000 [IU] | INTRAVENOUS | Status: AC | PRN
  Administered 2013-05-03: 500 [IU]
  Filled 2013-05-03: qty 5

## 2013-05-03 MED ORDER — DIPHENHYDRAMINE HCL 50 MG/ML IJ SOLN
25.0000 mg | Freq: Once | INTRAMUSCULAR | Status: AC
  Administered 2013-05-03: 25 mg via INTRAVENOUS
  Filled 2013-05-03: qty 1

## 2013-05-03 NOTE — Op Note (Signed)
Small Bowel Given Capsule Study Procedure date:  05/02/2013  Referring Provider:  Dr. Jerral Ralph PCP:  Dr. Phillips Odor, Chancy Hurter, MD  Indication for procedure:  Blood per ileostomy  Findings:  Attempted to place capsule into the duodenum endoscopically. Was not able to do so. Capsule left in stomach to migrate distally via normal peristalsis (see EGD report). Patient had a couple of small bowel erosions at 3 hours 15 minutes and 3 hours and 21 minutes. No obvious tumor seen. Fresh Blood in the proximal duodenum felt to be to related to recent endoscopic manipulation/gastric biopsy.   First Gastric image:  1 minute 23 seconds First Duodenal image: 1 hour 18 minutes First ileostomy image:  5 hours and 50 minutes   Summary & Recommendations:  Suspect blood per ileostomy related to NSAID-induced small bowel erosions in the setting of known NSAID use and thrombocytopenia  Patient should minimize use of nonsteroidal agents. Hematology to address pancytopenia. Will followup on gastric biopsies. From a GI standpoint, patient could be discharged after he completes but transfusion today.

## 2013-05-03 NOTE — Progress Notes (Signed)
Pt discharged home today per Dr. Jerral Ralph. Pt's port flushed with 10 cc NS and 500 units Heparin, then deaccessed. Site WNL. Pt's VS stable at this time. Pt provided with home medication list and discharge instructions. Verbalized understanding. Pt ambulated off floor, in stable condition, accompanied by NT.

## 2013-05-03 NOTE — Discharge Summary (Signed)
PATIENT DETAILS Name: [name] Age: 62 y.o. Sex: male Date of Birth: 07/31/1950 MRN: [id number]. Admit Date: 04/30/2013 Admitting Physician: Vania Rea, MD WUJ:[id number], Chancy Hurter, MD  Recommendations for Outpatient Follow-up:  1. Please check chemistry panel and CBC next week at follow up visit with PCP or oncology. 2. 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 Pancytopenia, acquired 12/04/2010  . B12 deficiency 12/07/2010  . Hypertension   . Diabetes mellitus   . Hypercholesteremia   . Kidney stones   . CAD (coronary artery disease)   . Anemia   . History of radiation therapy 06/19/07 -07/27/07    whole pelvis  . Hx of radiation therapy 08/06/12,08/09/12,& 08/13/12    rul 54GY/43fx  . Cancer     Colon ca dx 2008 surg/rad/chemo  . Lung cancer     DISCHARGE MEDICATIONS:   Medication List         cyanocobalamin 1000 MCG/ML injection  Commonly known as:  (VITAMIN B-12)  Inject 1,000 mcg into the muscle every 30 (thirty) days.     cyclobenzaprine 10 MG tablet  Commonly known as:  FLEXERIL  Take 1 tablet (10 mg total) by mouth 3 (three) times daily as needed for muscle spasms.     HYDROcodone-acetaminophen 5-325 MG per tablet  Commonly known as:  NORCO/VICODIN  Take 1 tablet by mouth every 6 (six) hours as needed.     lidocaine-prilocaine cream  Commonly known as:  EMLA  Apply 1 application topically as needed. Apply quarter sized amount over port-a-cath site one hour prior to chemotherapy appointment.     temazepam 15 MG capsule  Commonly known as:  RESTORIL  Take 1 capsule (15 mg total) by mouth at  bedtime as needed for sleep.        ALLERGIES:   Allergies  Allergen Reactions  . Daypro [Oxaprozin] Nausea And Vomiting    BRIEF HPI:  See H&P, Labs, Consult and Test reports for all details in brief, patient is a 62 year old male with a history of rectal cancer status post colectomy, now with a diverting ileostomy, with new metastatic lesions the lung,was admitted after he noticed blood in his ileostomy bag.  CONSULTATIONS:   GI  PERTINENT RADIOLOGIC STUDIES: Ct Chest W Contrast  04/18/2013   CLINICAL DATA:  Metastatic rectal carcinoma  EXAM: CT CHEST, ABDOMEN, AND PELVIS WITH CONTRAST  TECHNIQUE: Multidetector CT imaging of the chest, abdomen and pelvis was performed following the standard protocol during bolus administration of intravenous contrast.  CONTRAST:  OMNIPAQUE IOHEXOL 300 MG/ML  SOLN  COMPARISON:  01/14/2013  FINDINGS: CT CHEST FINDINGS  Interval near resolution of small right pleural effusion. Stable scarring/radiation change within the right upper lobe, image 15/series 3. There is a new pulmonary nodule in the right apex measuring 5 mm, image 15/series 3.  The trachea appears patent and is midline. There is mild cardiac enlargement. No pericardial effusion. Prominent calcifications are noted involving the LAD and left circumflex coronary artery.  There is mild multilevel spondylosis identified within the thoracic spine. No aggressive lytic or sclerotic bone lesion identified. Mild compression deformity  involving the T11 vertebra. This is unchanged from previous exam.  CT ABDOMEN AND PELVIS FINDINGS  Appearance of Previous cholecystectomy. Normal appearance of the pancreas. There is no biliary dilatation. Spleen is enlarged measuring 14 cm in length. The adrenal glands are both normal. Bilateral renal cysts are identified. Nonobstructing calculus within the inferior pole of the right kidney measures 6 mm, image 65/series 2. Within the inferior pole of the left kidney there  is a 7 mm stone, image number 74/series 2. There is left-sided pelvocaliectasis. Within the left renal pelvis there are 2 large stones which measure up to 1.3 cm. The urinary bladder appears normal. Prostate gland and seminal vesicles are unremarkable.  Normal caliber of the abdominal aorta. There is mild calcified atherosclerotic disease noted. No upper abdominal adenopathy identified no enlarged pelvic lymph nodes.  Stomach is normal. The small bowel loops have a normal caliber without evidence to suggest bowel obstruction. Subtotal colectomy has been performed. There is a right lower quadrant ileostomy which contains a peristomal hernia with nonobstructed loops of small bowel. Similar appearance of circumferential thickening of the wall of rectum, image 107/series 2. Next  Review of the visualized osseous structures is significant for lumbar spondylosis. No aggressive lytic or sclerotic bone lesions identified. Nonaggressive appearing sclerotic structure within the left iliac bone is stable from previous exam, image number 85/series 2.  IMPRESSION: 1. New suspicious nodule within the right lung apex in the area of presumed radiation change. 2. No focal liver lesions are identified on today's examination to suggest recurrence of liver metastases. The appearance of the abdomen pelvis is essentially stable.   Electronically Signed   By: Signa Kell M.D.   On: 04/18/2013 10:46   Ct Abdomen Pelvis W Contrast  04/18/2013   CLINICAL DATA:  Metastatic rectal carcinoma  EXAM: CT CHEST, ABDOMEN, AND PELVIS WITH CONTRAST  TECHNIQUE: Multidetector CT imaging of the chest, abdomen and pelvis was performed following the standard protocol during bolus administration of intravenous contrast.  CONTRAST:  OMNIPAQUE IOHEXOL 300 MG/ML  SOLN  COMPARISON:  01/14/2013  FINDINGS: CT CHEST FINDINGS  Interval near resolution of small right pleural effusion. Stable scarring/radiation change within the right upper lobe, image  15/series 3. There is a new pulmonary nodule in the right apex measuring 5 mm, image 15/series 3.  The trachea appears patent and is midline. There is mild cardiac enlargement. No pericardial effusion. Prominent calcifications are noted involving the LAD and left circumflex coronary artery.  There is mild multilevel spondylosis identified within the thoracic spine. No aggressive lytic or sclerotic bone lesion identified. Mild compression deformity involving the T11 vertebra. This is unchanged from previous exam.  CT ABDOMEN AND PELVIS FINDINGS  Appearance of Previous cholecystectomy. Normal appearance of the pancreas. There is no biliary dilatation. Spleen is enlarged measuring 14 cm in length. The adrenal glands are both normal. Bilateral renal cysts are identified. Nonobstructing calculus within the inferior pole of the right kidney measures 6 mm, image 65/series 2. Within the inferior pole of the left kidney there is a 7 mm stone, image number 74/series 2. There is left-sided pelvocaliectasis. Within the left renal pelvis there are 2 large stones which measure up to 1.3 cm. The urinary bladder appears normal. Prostate gland and seminal vesicles are unremarkable.  Normal caliber of the abdominal aorta. There is mild calcified atherosclerotic disease noted. No upper abdominal adenopathy identified no enlarged pelvic lymph nodes.  Stomach is normal. The small bowel loops have a normal caliber  without evidence to suggest bowel obstruction. Subtotal colectomy has been performed. There is a right lower quadrant ileostomy which contains a peristomal hernia with nonobstructed loops of small bowel. Similar appearance of circumferential thickening of the wall of rectum, image 107/series 2. Next  Review of the visualized osseous structures is significant for lumbar spondylosis. No aggressive lytic or sclerotic bone lesions identified. Nonaggressive appearing sclerotic structure within the left iliac bone is stable from  previous exam, image number 85/series 2.  IMPRESSION: 1. New suspicious nodule within the right lung apex in the area of presumed radiation change. 2. No focal liver lesions are identified on today's examination to suggest recurrence of liver metastases. The appearance of the abdomen pelvis is essentially stable.   Electronically Signed   By: Signa Kell M.D.   On: 04/18/2013 10:46   US Renal  05/01/2013   CLINICAL DATA:  Acute renal failure  EXAM: RENAL/URINARY TRACT ULTRASOUND COMPLETE  COMPARISON:  CT abdomen pelvis 04/18/2013  FINDINGS: Right Kidney:  Length: 11.9 cm. Cortical thinning. Normal cortical echogenicity. Multiple small cysts largest upper pole 13 x 10 x 13 mm. Small calculus inferior pole. No hydronephrosis or solid mass.  Left Kidney:  Length: 12.2 cm. Cortical thinning. Normal cortical echogenicity. Multiple cysts including a simple cyst at the inferior pole 2.9 x 2.8 x 2.9 cm and a mildly complicated cyst at the mid left kidney 2.0 x 1.5 x 2.2 cm. No definite solid mass or hydronephrosis. Scattered echogenic foci likely calculi identified on recent CT.  Bladder:  Partially distended without wall thickening or mass. 8 mm echogenic focus dependently, question debris, cannot exclude a nonshadowing vesicular calculus.  IMPRESSION: Bilateral renal cysts including a complicated cyst in the left kidney.  Probable bilateral nonobstructing renal calculi.  No definite solid renal mass or hydronephrosis.   Electronically Signed   By: Ulyses Southward M.D.   On: 05/01/2013 17:34     PERTINENT LAB RESULTS: CBC:  Recent Labs  05/02/13 0459 05/03/13 0517  WBC 3.7* 2.7*  HGB 8.4* 7.7*  HCT 25.5* 23.4*  PLT 52* 41*   CMET CMP     Component Value Date/Time   NA 140 05/03/2013 0517   K 3.6 05/03/2013 0517   CL 110 05/03/2013 0517   CO2 20 05/03/2013 0517   GLUCOSE 73 05/03/2013 0517   BUN 13 05/03/2013 0517   CREATININE 2.00* 05/03/2013 0517   CALCIUM 8.3* 05/03/2013 0517   PROT 6.3 04/05/2013  1008   ALBUMIN 2.9* 04/05/2013 1008   AST 26 04/05/2013 1008   ALT 12 04/05/2013 1008   ALKPHOS 107 04/05/2013 1008   BILITOT 1.3* 04/05/2013 1008   GFRNONAA 35* 05/03/2013 0517   GFRAA 40* 05/03/2013 0517    GFR Estimated Creatinine Clearance: 46.6 ml/min (by C-G formula based on Cr of 2). No results found for this basename: LIPASE, AMYLASE,  in the last 72 hours No results found for this basename: CKTOTAL, CKMB, CKMBINDEX, TROPONINI,  in the last 72 hours No components found with this basename: POCBNP,  No results found for this basename: DDIMER,  in the last 72 hours  Recent Labs  05/01/13 0647  HGBA1C 4.9   No results found for this basename: CHOL, HDL, LDLCALC, TRIG, CHOLHDL, LDLDIRECT,  in the last 72 hours No results found for this basename: TSH, T4TOTAL, FREET3, T3FREE, THYROIDAB,  in the last 72 hours No results found for this basename: VITAMINB12, FOLATE, FERRITIN, TIBC, IRON, RETICCTPCT,  in the last 72 hours Coags:  Recent Labs  04/30/13 2320  INR 1.20   Microbiology: No results found for this or any previous visit (from the past 240 hour(s)).   BRIEF HOSPITAL COURSE:  Bleeding from ileostomy site  - History of rectal cancer status post subtotal colectomy with ileostomy,- Suspected GI bleed. patient was admitted, GI evaluation was obtained, EGD was done on 12/4 which did not show any bleeding foci. Subsequently patient underwent a small bowel capsule endoscopy, which showed a couple of small bowel erosions. Recommendations from gastroenterology are to avoid nonsteroidal anti-inflammatory medications, they presume that small bowel erosions were NSAID induced.  - In any event, patient had no further bleeding from the ileostomy site since admission. He is considered stable to be discharged home.  Acute renal failure  - Creatinine on admission was found to have acute renal failure, was felt secondary to GI bleed. Patient was hydrated, creatinine has no nondistended 2.0, it  was 2.89 on admission. - Patient clinically looks euvolemic, we have subsequently stopped his IV fluids. Nephrotoxic agents were avoided. Renal ultrasound done on 12/3 showed bilateral renal cysts, bilateral nonobstructing renal calculi however there was no hydronephrosis.  - Per patient, he has a long-standing history of nephrolithiasis, and occasionally passes renal stones. - I have asked the patient to followup with his primary care practitioner within a week to check a chemistry panel.  Anemia - Suspect a combination of acute blood loss anemia, and an anemia from chronic disease/renal failure. Hemoglobin on the day of discharge was 7.7, patient was transfused 1 unit of PRBC, posttransfusion hemoglobin is 8.6. I have asked the patient to follow with his PCP for CBC check next week  Thrombocytopenia  - Chronic. Monitor platelets closely as an outpatient. Spoke with the oncology team, likely secondary to cirrhosis.  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  - Reviewed outpatient radiology oncology notes and medical oncology notes. Per medical oncology note, patient is a poor candidate for further chemotherapy given his pancytopenia.  -Have asked patient to keep his next appointment with medical oncology and radiation oncology.  TODAY-DAY OF DISCHARGE:  Subjective:   [name] today has no headache,no chest abdominal pain,no new weakness tingling or numbness, feels much better wants to go home today.   Objective:   Blood pressure 117/60, pulse 62, temperature 98.1 F (36.7 C), temperature source Oral, resp. rate 14, height 5\' 10"  (1.778 m), weight 100.2 kg (220 lb 14.4 oz), SpO2 100.00%.  Intake/Output Summary (Last 24 hours) at 05/03/13 1440 Last data filed at 05/03/13 1246  Gross per 24 hour  Intake  802.5 ml  Output      2 ml  Net  800.5 ml   Filed Weights   05/02/13 0400 05/02/13 1423 05/03/13 0413  Weight: 99.655 kg (219 lb 11.2 oz) 99.338 kg (219 lb)  100.2 kg (220 lb 14.4 oz)    Exam Awake Alert, Oriented *3, No new F.N deficits, Normal affect [place].AT,PERRAL Supple Neck,No JVD, No cervical lymphadenopathy appriciated.  Symmetrical Chest wall movement, Good air movement bilaterally, CTAB RRR,No Gallops,Rubs or new Murmurs, No Parasternal Heave +ve B.Sounds, Abd Soft, Non tender, No organomegaly appriciated, No rebound -guarding or rigidity. No Cyanosis, Clubbing or edema, No new Rash or bruise  DISCHARGE CONDITION: Stable  DISPOSITION: Home  DISCHARGE INSTRUCTIONS:    Activity:  As tolerated  Diet recommendation: Regular Diet       Discharge Orders   Future Appointments Provider Department Dept Phone   05/17/2013 10:30 AM [facility] [phone]   07/01/2013 10:15 AM Ap-[facility] [address] IMAGING [phone]   Liquids only 4 hours prior to your exam. Any medications can be taken as usual. Please arrive 15 min prior to your scheduled exam time.   07/02/2013 9:30 AM Ellouise Newer, PA-C [facility] [phone]   09/05/2013 8:00 AM Oneita Hurt, MD [place] [facility] RADI[facility] ONCOLOGY [phone]   Future Orders Complete By Expires   Call MD for:  persistant nausea and vomiting  As directed    Diet general  As directed    Increase activity slowly  As directed       Follow-up Information   Follow up with [name], MD. Schedule an appointment as soon as possible for a visit in 5 days.   Specialty:  Family Medicine   Contact information:   [address] [place] [address] [phone]       Follow up with KEFALAS,THOMAS, PA-C. Schedule an appointment as soon as possible for a visit in 1 week.   Specialty:  Physician Assistant   Contact information:   [address] [place] [address] [phone]      Total Time spent on discharge equals 45 minutes.  SignedJeoffrey Massed 05/03/2013 2:40 PM

## 2013-05-03 NOTE — Progress Notes (Signed)
Subjective:  Denies abdominal pain. Denies overt GI bleeding since admission. Did not notice any blood in ileostomy since EGD. Hematuria resolved.   Objective: Vital signs in last 24 hours: Temp:  [97.8 F (36.6 C)-98.3 F (36.8 C)] 98.3 F (36.8 C) (12/05 1146) Pulse Rate:  [59-108] 59 (12/05 1146) Resp:  [12-23] 14 (12/05 1146) BP: (106-142)/(48-90) 114/60 mmHg (12/05 1146) SpO2:  [94 %-100 %] 100 % (12/05 1146) Weight:  [219 lb (99.338 kg)-220 lb 14.4 oz (100.2 kg)] 220 lb 14.4 oz (100.2 kg) (12/05 0413) Last BM Date: 05/03/13 General:   Alert,  Well-developed, well-nourished, pleasant and cooperative in NAD Head:  Normocephalic and atraumatic. Eyes:  Sclera clear, no icterus.  Abdomen:  Soft, nontender and nondistended.  Ileostomy with liquid green stool. Extremities:  Without clubbing, deformity or edema. Neurologic:  Alert and  oriented x4;  grossly normal neurologically. Skin:  Intact without significant lesions or rashes. Psych:  Alert and cooperative. Normal mood and affect.  Intake/Output from previous day:   Intake/Output this shift: Total I/O In: 652.5 [P.O.:240; I.V.:250; Blood:162.5] Out: 2 [Urine:2]  Lab Results: CBC  Recent Labs  05/01/13 0642 05/02/13 0459 05/03/13 0517  WBC 6.3 3.7* 2.7*  HGB 10.0* 8.4* 7.7*  HCT 29.8* 25.5* 23.4*  MCV 90.3 91.1 91.1  PLT 57* 52* 41*   BMET  Recent Labs  05/01/13 0642 05/02/13 0459 05/03/13 0517  NA 140 140 140  K 3.7 3.7 3.6  CL 109 110 110  CO2 22 20 20   GLUCOSE 113* 78 73  BUN 17 15 13   CREATININE 2.78* 2.36* 2.00*  CALCIUM 8.7 8.6 8.3*   LFTs No results found for this basename: BILITOT, BILIDIR, IBILI, ALKPHOS, AST, ALT, PROT, ALBUMIN,  in the last 72 hours No results found for this basename: LIPASE,  in the last 72 hours PT/INR  Recent Labs  04/30/13 2320  LABPROT 14.9  INR 1.20      Imaging Studies: Ct Chest W Contrast  04/18/2013   CLINICAL DATA:  Metastatic rectal carcinoma  EXAM:  CT CHEST, ABDOMEN, AND PELVIS WITH CONTRAST  TECHNIQUE: Multidetector CT imaging of the chest, abdomen and pelvis was performed following the standard protocol during bolus administration of intravenous contrast.  CONTRAST:  OMNIPAQUE IOHEXOL 300 MG/ML  SOLN  COMPARISON:  01/14/2013  FINDINGS: CT CHEST FINDINGS  Interval near resolution of small right pleural effusion. Stable scarring/radiation change within the right upper lobe, image 15/series 3. There is a new pulmonary nodule in the right apex measuring 5 mm, image 15/series 3.  The trachea appears patent and is midline. There is mild cardiac enlargement. No pericardial effusion. Prominent calcifications are noted involving the LAD and left circumflex coronary artery.  There is mild multilevel spondylosis identified within the thoracic spine. No aggressive lytic or sclerotic bone lesion identified. Mild compression deformity involving the T11 vertebra. This is unchanged from previous exam.  CT ABDOMEN AND PELVIS FINDINGS  Appearance of Previous cholecystectomy. Normal appearance of the pancreas. There is no biliary dilatation. Spleen is enlarged measuring 14 cm in length. The adrenal glands are both normal. Bilateral renal cysts are identified. Nonobstructing calculus within the inferior pole of the right kidney measures 6 mm, image 65/series 2. Within the inferior pole of the left kidney there is a 7 mm stone, image number 74/series 2. There is left-sided pelvocaliectasis. Within the left renal pelvis there are 2 large stones which measure up to 1.3 cm. The urinary bladder appears normal. Prostate gland and  seminal vesicles are unremarkable.  Normal caliber of the abdominal aorta. There is mild calcified atherosclerotic disease noted. No upper abdominal adenopathy identified no enlarged pelvic lymph nodes.  Stomach is normal. The small bowel loops have a normal caliber without evidence to suggest bowel obstruction. Subtotal colectomy has been performed.  There is a right lower quadrant ileostomy which contains a peristomal hernia with nonobstructed loops of small bowel. Similar appearance of circumferential thickening of the wall of rectum, image 107/series 2. Next  Review of the visualized osseous structures is significant for lumbar spondylosis. No aggressive lytic or sclerotic bone lesions identified. Nonaggressive appearing sclerotic structure within the left iliac bone is stable from previous exam, image number 85/series 2.  IMPRESSION: 1. New suspicious nodule within the right lung apex in the area of presumed radiation change. 2. No focal liver lesions are identified on today's examination to suggest recurrence of liver metastases. The appearance of the abdomen pelvis is essentially stable.   Electronically Signed   By: Signa Kell M.D.   On: 04/18/2013 10:46   Ct Abdomen Pelvis W Contrast  04/18/2013   CLINICAL DATA:  Metastatic rectal carcinoma  EXAM: CT CHEST, ABDOMEN, AND PELVIS WITH CONTRAST  TECHNIQUE: Multidetector CT imaging of the chest, abdomen and pelvis was performed following the standard protocol during bolus administration of intravenous contrast.  CONTRAST:  OMNIPAQUE IOHEXOL 300 MG/ML  SOLN  COMPARISON:  01/14/2013  FINDINGS: CT CHEST FINDINGS  Interval near resolution of small right pleural effusion. Stable scarring/radiation change within the right upper lobe, image 15/series 3. There is a new pulmonary nodule in the right apex measuring 5 mm, image 15/series 3.  The trachea appears patent and is midline. There is mild cardiac enlargement. No pericardial effusion. Prominent calcifications are noted involving the LAD and left circumflex coronary artery.  There is mild multilevel spondylosis identified within the thoracic spine. No aggressive lytic or sclerotic bone lesion identified. Mild compression deformity involving the T11 vertebra. This is unchanged from previous exam.  CT ABDOMEN AND PELVIS FINDINGS  Appearance of  Previous cholecystectomy. Normal appearance of the pancreas. There is no biliary dilatation. Spleen is enlarged measuring 14 cm in length. The adrenal glands are both normal. Bilateral renal cysts are identified. Nonobstructing calculus within the inferior pole of the right kidney measures 6 mm, image 65/series 2. Within the inferior pole of the left kidney there is a 7 mm stone, image number 74/series 2. There is left-sided pelvocaliectasis. Within the left renal pelvis there are 2 large stones which measure up to 1.3 cm. The urinary bladder appears normal. Prostate gland and seminal vesicles are unremarkable.  Normal caliber of the abdominal aorta. There is mild calcified atherosclerotic disease noted. No upper abdominal adenopathy identified no enlarged pelvic lymph nodes.  Stomach is normal. The small bowel loops have a normal caliber without evidence to suggest bowel obstruction. Subtotal colectomy has been performed. There is a right lower quadrant ileostomy which contains a peristomal hernia with nonobstructed loops of small bowel. Similar appearance of circumferential thickening of the wall of rectum, image 107/series 2. Next  Review of the visualized osseous structures is significant for lumbar spondylosis. No aggressive lytic or sclerotic bone lesions identified. Nonaggressive appearing sclerotic structure within the left iliac bone is stable from previous exam, image number 85/series 2.  IMPRESSION: 1. New suspicious nodule within the right lung apex in the area of presumed radiation change. 2. No focal liver lesions are identified on today's examination to suggest recurrence  of liver metastases. The appearance of the abdomen pelvis is essentially stable.   Electronically Signed   By: Signa Kell M.D.   On: 04/18/2013 10:46   US Renal  05/01/2013   CLINICAL DATA:  Acute renal failure  EXAM: RENAL/URINARY TRACT ULTRASOUND COMPLETE  COMPARISON:  CT abdomen pelvis 04/18/2013  FINDINGS: Right Kidney:   Length: 11.9 cm. Cortical thinning. Normal cortical echogenicity. Multiple small cysts largest upper pole 13 x 10 x 13 mm. Small calculus inferior pole. No hydronephrosis or solid mass.  Left Kidney:  Length: 12.2 cm. Cortical thinning. Normal cortical echogenicity. Multiple cysts including a simple cyst at the inferior pole 2.9 x 2.8 x 2.9 cm and a mildly complicated cyst at the mid left kidney 2.0 x 1.5 x 2.2 cm. No definite solid mass or hydronephrosis. Scattered echogenic foci likely calculi identified on recent CT.  Bladder:  Partially distended without wall thickening or mass. 8 mm echogenic focus dependently, question debris, cannot exclude a nonshadowing vesicular calculus.  IMPRESSION: Bilateral renal cysts including a complicated cyst in the left kidney.  Probable bilateral nonobstructing renal calculi.  No definite solid renal mass or hydronephrosis.   Electronically Signed   By: Ulyses Southward M.D.   On: 05/01/2013 17:34  [2 weeks]    Assessment:  62 y/o male with history of metastatic colon carcinoma initially diagnosed in 2008 and received radiation therapy to pelvic region in 2009. 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 Presents with GI bleed. ?initially melena followed by fresh blood and blood clot. Last episode of bleeding the day of admission. Some recent minimal Aleve use. H/O splenomegaly and thrombocytopenia, prior heavy etoh use but no cirrhosis on imaging studies.Hgb 12.1 three months ago, 10.7 three weeks ago. 10.9 on admission but down to 7.7 today. Really pancytopenia.  EGD yesterday showed polypoid antral fold s/p biopsy. Pyloric channel somewhat friable and bleeding related to multiple attempts to place capsule into duodenum. Small bowel capsule study final report pending. Discussed with Dr. 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 No obvious tumor.   Patient reports hematuria with passing of kidney stone during this admission.   Plan: 1. Consider  outpatient hematology follow-up for pancytopenia of unknown etiology. Thrombocytopenia dates back to at least 2009. No evidence of cirrhosis on imaging.  2. Avoid all NSAIDs.  3. F/U gastric path. 4. One unit of prbcs as planned. OK for discharge from GI standpoint.    LOS: 3 days   Tana Coast  05/03/2013, 12:08 PM

## 2013-05-03 NOTE — Care Management Note (Signed)
    Page 1 of 1   05/03/2013     10:40:37 AM   CARE MANAGEMENT NOTE 05/03/2013  Patient:  [name], [name]   Account Number:  [id number]  Date Initiated:  05/03/2013  Documentation initiated by:  Sharrie Rothman  Subjective/Objective Assessment:   PT admitted from home with gi bleeding. Pt lives with his wife and will return home at discharge. Pt is independent with ADL's.     Action/Plan:   No CM needs noted. Pt for discharge after blood transfusion.   Anticipated DC Date:  05/03/2013   Anticipated DC Plan:  HOME/SELF CARE      DC Planning Services  CM consult      Choice offered to / List presented to:             Status of service:  Completed, signed off Medicare Important Message given?  YES (If response is "NO", the following Medicare IM given date fields will be blank) Date Medicare IM given:  05/03/2013 Date Additional Medicare IM given:    Discharge Disposition:  HOME/SELF CARE  Per UR Regulation:    If discussed at Long Length of Stay Meetings, dates discussed:    Comments:  05/03/13 1040 Arlyss Queen, RN BSN CM

## 2013-05-04 LAB — TYPE AND SCREEN: Antibody Screen: NEGATIVE

## 2013-05-06 ENCOUNTER — Encounter: Payer: Self-pay | Admitting: Internal Medicine

## 2013-05-08 ENCOUNTER — Encounter (HOSPITAL_COMMUNITY): Payer: Self-pay | Admitting: Internal Medicine

## 2013-05-17 ENCOUNTER — Encounter (HOSPITAL_BASED_OUTPATIENT_CLINIC_OR_DEPARTMENT_OTHER): Payer: Managed Care, Other (non HMO)

## 2013-05-17 DIAGNOSIS — C189 Malignant neoplasm of colon, unspecified: Secondary | ICD-10-CM

## 2013-05-17 DIAGNOSIS — Z452 Encounter for adjustment and management of vascular access device: Secondary | ICD-10-CM

## 2013-05-17 DIAGNOSIS — C20 Malignant neoplasm of rectum: Secondary | ICD-10-CM

## 2013-05-17 LAB — COMPREHENSIVE METABOLIC PANEL
ALT: 21 U/L (ref 0–53)
AST: 34 U/L (ref 0–37)
Albumin: 3.1 g/dL — ABNORMAL LOW (ref 3.5–5.2)
BUN: 12 mg/dL (ref 6–23)
CO2: 22 mEq/L (ref 19–32)
Calcium: 9.2 mg/dL (ref 8.4–10.5)
Chloride: 108 mEq/L (ref 96–112)
Creatinine, Ser: 1.54 mg/dL — ABNORMAL HIGH (ref 0.50–1.35)
GFR calc Af Amer: 55 mL/min — ABNORMAL LOW (ref >90)
GFR calc non Af Amer: 47 mL/min — ABNORMAL LOW (ref >90)
Sodium: 140 mEq/L (ref 135–145)
Total Bilirubin: 1.4 mg/dL — ABNORMAL HIGH (ref 0.3–1.2)

## 2013-05-17 LAB — CBC WITH DIFFERENTIAL/PLATELET
Eosinophils Absolute: 0.1 10*3/uL (ref 0.0–0.7)
Eosinophils Relative: 3 % (ref 0–5)
HCT: 31.3 % — ABNORMAL LOW (ref 39.0–52.0)
Lymphs Abs: 0.5 10*3/uL — ABNORMAL LOW (ref 0.7–4.0)
MCH: 29.4 pg (ref 26.0–34.0)
MCV: 91.3 fL (ref 78.0–100.0)
Monocytes Relative: 10 % (ref 3–12)
Neutro Abs: 3.4 10*3/uL (ref 1.7–7.7)
Platelets: 48 10*3/uL — ABNORMAL LOW (ref 150–400)
RBC: 3.43 MIL/uL — ABNORMAL LOW (ref 4.22–5.81)
RDW: 15.1 % (ref 11.5–15.5)
Smear Review: DECREASED
WBC: 4.4 10*3/uL (ref 4.0–10.5)

## 2013-05-17 MED ORDER — HEPARIN SOD (PORK) LOCK FLUSH 100 UNIT/ML IV SOLN
500.0000 [IU] | Freq: Once | INTRAVENOUS | Status: AC
  Administered 2013-05-17: 500 [IU] via INTRAVENOUS

## 2013-05-17 MED ORDER — SODIUM CHLORIDE 0.9 % IJ SOLN
10.0000 mL | INTRAMUSCULAR | Status: DC | PRN
  Administered 2013-05-17: 10 mL via INTRAVENOUS

## 2013-05-17 MED ORDER — HEPARIN SOD (PORK) LOCK FLUSH 100 UNIT/ML IV SOLN
INTRAVENOUS | Status: AC
  Filled 2013-05-17: qty 5

## 2013-05-17 NOTE — Progress Notes (Signed)
[name] presented for Portacath access and flush.  Portacath located left chest wall accessed with  H 20 needle.  Good blood return present. Portacath flushed with 20ml NS and 500U/52ml Heparin and needle removed intact.  Procedure tolerated well and without incident.

## 2013-05-18 LAB — CEA: CEA: 4.3 ng/mL (ref 0.0–5.0)

## 2013-05-22 ENCOUNTER — Other Ambulatory Visit (HOSPITAL_COMMUNITY): Payer: Managed Care, Other (non HMO)

## 2013-06-06 ENCOUNTER — Telehealth: Payer: Self-pay | Admitting: Gastroenterology

## 2013-06-06 NOTE — Telephone Encounter (Signed)
Reminder in EPIC 

## 2013-06-06 NOTE — Telephone Encounter (Signed)
Please let patient know, I discussed with RMR regarding next time for proctoscopy/colonoscopy. He advised in 07/2015.   Please NIC.

## 2013-06-06 NOTE — Telephone Encounter (Signed)
Mailed letter to pt

## 2013-06-24 ENCOUNTER — Ambulatory Visit (HOSPITAL_COMMUNITY): Payer: Medicare Other | Admitting: Oncology

## 2013-07-01 ENCOUNTER — Telehealth (HOSPITAL_COMMUNITY): Payer: Self-pay

## 2013-07-01 ENCOUNTER — Telehealth: Payer: Self-pay | Admitting: *Deleted

## 2013-07-01 ENCOUNTER — Ambulatory Visit (HOSPITAL_COMMUNITY)
Admission: RE | Admit: 2013-07-01 | Discharge: 2013-07-01 | Disposition: A | Discharge status: Home / Self Care | Payer: Managed Care, Other (non HMO) | Source: Ambulatory Visit | Attending: Oncology | Admitting: Oncology

## 2013-07-01 DIAGNOSIS — R071 Chest pain on breathing: Secondary | ICD-10-CM | POA: Insufficient documentation

## 2013-07-01 DIAGNOSIS — R161 Splenomegaly, not elsewhere classified: Secondary | ICD-10-CM | POA: Insufficient documentation

## 2013-07-01 DIAGNOSIS — R918 Other nonspecific abnormal finding of lung field: Secondary | ICD-10-CM | POA: Insufficient documentation

## 2013-07-01 DIAGNOSIS — C20 Malignant neoplasm of rectum: Secondary | ICD-10-CM | POA: Insufficient documentation

## 2013-07-01 NOTE — Telephone Encounter (Signed)
Scan results called from Radiology  EXAM:  CT CHEST WITHOUT CONTRAST  TECHNIQUE:  Multidetector CT imaging of the chest was performed following the  standard protocol without IV contrast.  COMPARISON: CT ABD - PELV W/ CM dated 04/18/2013  FINDINGS:  The thoracic inlet is unremarkable.  A porta catheter is identified within the chest wall on the left tip  projecting in the region of the superior vena caval right atrial  junction. Atherosclerotic calcifications identified within the  coronary vessels. Atherosclerotic calcifications also identified  within the aortic arch. There is mild to moderate multi chamber  cardiac enlargement. There is no evidence of mediastinal nor hilar  adenopathy.  There is decreased conspicuity of the radiation change within the  right lung apex greatest confluence appreciated on image 15 series  3. The and pulmonary nodule within the medial aspect of the right  upper lobe image 15 series 3 has increased in size when compared to  the previous study measuring 7.5 mm and diameter. Date is second of  pulmonary nodule is identified within the medial aspect of the left  upper lobe image 27 series 3 measuring 9 mm in diameter. A stable  area of subpleural scarring is identified within the left lung base.  There is no evidence of focal infiltrates, effusions, nor edema.  There is no evidence of pulmonary masses. The central airways are  patent.  The visualized upper abdominal viscera demonstrates a stable  bilateral renal cysts. Patient is status post cholecystectomy. The  spleen once again appears enlarged. Remaining visualized upper  abdominal viscera otherwise grossly unremarkable.  The osseous structures demonstrate no evidence of aggressive  appearing osseous lesions. Multilevel spondylolysis is once again  appreciated. The soft tissues of the chest wall are unremarkable.  IMPRESSION:  Increased size and conspicuity of the right upper lobe nodule and   interval development of a left upper lobe nodule. These findings are  consistent with areas of metastatic disease considering the  patient's history until proven otherwise.  Splenomegaly is once again appreciated. Otherwise stable findings  within the abdomen, stable lymph node within the right costophrenic  region considering the patient's history attention to this area on  subsequent imaging is recommended.  There is no CT evidence reflecting an etiology for the patient's  complaints of chest wall pain.  Decreased conspicuity of the post radiation changes in the right  upper lobe.  These results will be called to the ordering clinician or  representative by the Radiologist Assistant, and communication  documented in the PACS Dashboard.

## 2013-07-01 NOTE — Telephone Encounter (Signed)
Call report of Today's CT chest received from cassandra.  The following impression given.  "Increased size and conspicuity of the right upper lobe nodule and  interval development of a left upper lobe nodule. These findings are  consistent with areas of metastatic disease considering the  patient's history until proven otherwise."  Opened chart, noted this is not a [facility] patient, Wilburn Cornelia to notify her to call Forestine Na at [phone] with the call report.

## 2013-07-01 NOTE — Progress Notes (Signed)
[name] Kilts, MD [address] [place] [address]  Adenocarcinoma of rectum - Plan: CBC with Differential, Comprehensive metabolic panel, CEA, HYDROcodone-acetaminophen (NORCO/VICODIN) 5-325 MG per tablet, CBC with Differential, Comprehensive metabolic panel, CEA  2.3 cm solitary lung metastasis from rectal cancer  Port catheter in place - Plan: sodium chloride 0.9 % injection 10 mL, heparin lock flush 100 unit/mL, sodium chloride 0.9 % injection 10 mL  B12 deficiency - Plan: SCHEDULING COMMUNICATION INJECTION, cyanocobalamin ((VITAMIN B-12)) injection 1,000 mcg  Shoulder pain, right - Plan: HYDROcodone-acetaminophen (NORCO/VICODIN) 5-325 MG per tablet  CURRENT THERAPY: Surveillance. B12 injections monthly  INTERVAL HISTORY: [name] 63 y.o. 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  He has been on drug-holiday since February of 2014 after completing 24 cycles of FOLFOX and Avastin.  His last PET/CT result in May of 2014 showed; " Persistent malignant range FDG uptake associated with the right upper lobe nodule. The degree of FDG uptake is not significantly improved from previous exam. The nodule in the right upper lobe demonstrates interval cavitation compared with previous exam. Decrease in hypermetabolism associated with multifocal liver metastasis. On today's study there is no abnormal uptake within the liver above background activity." He has since undergone radiation therapy by Dr. [name] to this right lung lesion. CT CAP on 04/18/2013 shows a new right apex of the lung lesion measuring 4.5 mm in size and is medial to aforementioned lung lesion.  Most recent CT scan on 07/01/2013 demonstrates an increase in size of the lung lesion to 7.5 mm and there is now a second lesion measuring 9 mm in size in the medial aspect of left upper lobe.   I personally reviewed and went  over laboratory results with the patient.  The results follow below.  I personally reviewed and went over radiographic studies with the patient.  Results follow below. He tolerated the news well with regards to his two pulmonary lesions that are suspicisou for malignancy (metastatic disease versus bronchogenic primary)  A message was sent to Dr. [name] regarding CT scan finding. I will discuss the role of PET scanning for [name] as his largest lesion in the lungs is only 9 mm and at the borderline of PET scan sensitivity.  I will defer this decision to Dr. [name] to help plan for future SBRT.    Heyden continues to pass renal calculi.  I will refer him to urology for consultation and see if they have anything to offer him as this affects his quality of life.  Oncologically, and hematologically, he denies any complaints and ROS questioning is negative.   Past Medical History  Diagnosis Date  . Pancytopenia, acquired 12/04/2010  . B12 deficiency 12/07/2010  . Hypertension   . Diabetes mellitus   . Hypercholesteremia   . Kidney stones   . CAD (coronary artery disease)   . Anemia   . History of radiation therapy 06/19/07 -07/27/07    whole pelvis  . Hx of radiation therapy 08/06/12,08/09/12,& 08/13/12    rul 54GY/38f  . Cancer     Colon ca dx 2008 surg/rad/chemo  . Lung cancer     has DIABETES MELLITUS, TYPE II; HYPERCHOLESTEROLEMIA; ANEMIA, CHRONIC; CORONARY ARTERY DISEASE; SLEEP APNEA; Adenocarcinoma of rectum; Pancytopenia, acquired; B12 deficiency; Port catheter in place; Hx of radiation therapy; GI bleeding; Thrombocytopenia; and Acute on chronic renal insufficiency on his problem list.  is allergic to daypro.  We administered sodium chloride, heparin lock flush, sodium chloride, and cyanocobalamin.  Past Surgical History  Procedure Laterality Date  . Ileostomy  12/07/2010    Procedure: ILEOSTOMY;  Surgeon: Donato Heinz;  Location: [facility];  Service: General;  Laterality: N/A;   Diverting Ileostomy Lysis of adhesions Exploratory Laparotomy  . Lung biopsy  10/27/10    rt lobe- adenocarcinoma  . Cholecystectomy    . Cataract extraction w/ intraocular lens implant  2007    bil  . Lithotripsy  2002  . Coronary angioplasty with stent placement  2003    stenting x 2  . Colon resection  2008  . Esophagogastroduodenoscopy N/A 05/02/2013    Procedure: ESOPHAGOGASTRODUODENOSCOPY (EGD);  Surgeon: Daneil Dolin, MD;  Location: [facility];  Service: Endoscopy;  Laterality: N/A;  . Givens capsule study N/A 05/02/2013    Procedure: GIVENS CAPSULE STUDY;  Surgeon: Daneil Dolin, MD;  Location: [facility];  Service: Endoscopy;  Laterality: N/A;    Denies any headaches, dizziness, double vision, fevers, chills, night sweats, nausea, vomiting, diarrhea, constipation, chest pain, heart palpitations, shortness of breath, blood in stool, black tarry stool, urinary pain, urinary burning, urinary frequency, hematuria.   PHYSICAL EXAMINATION  ECOG PERFORMANCE STATUS: 0 - Asymptomatic  Filed Vitals:   07/02/13 0900  BP: 132/76  Pulse: 76  Temp: 97.4 F (36.3 C)  Resp: 18    GENERAL:alert, no distress, well nourished, well developed, comfortable, cooperative, obese and smiling SKIN: skin color, texture, turgor are normal, no rashes or significant lesions HEAD: Normocephalic, No masses, lesions, tenderness or abnormalities EYES: normal, PERRLA, EOMI, Conjunctiva are pink and non-injected EARS: External ears normal OROPHARYNX:mucous membranes are moist and poor dentition  NECK: supple, no adenopathy, trachea midline LYMPH:  no palpable lymphadenopathy BREAST:not examined LUNGS: clear to auscultation  HEART: regular rate & rhythm, no murmurs and no gallops ABDOMEN:abdomen soft, normal bowel sounds and colostomy producing stool. BACK: Back symmetric, no curvature. EXTREMITIES:less then 2 second capillary refill, no joint deformities, effusion, or inflammation, no edema, no  skin discoloration, no clubbing, no cyanosis  NEURO: alert & oriented x 3 with fluent speech, no focal motor/sensory deficits, gait normal    LABORATORY DATA: CBC    Component Value Date/Time   WBC 4.1 07/02/2013 0853   RBC 3.52* 07/02/2013 0853   HGB 9.4* 07/02/2013 0853   HCT 29.3* 07/02/2013 0853   PLT 49* 07/02/2013 0853   MCV 83.2 07/02/2013 0853   MCH 26.7 07/02/2013 0853   MCHC 32.1 07/02/2013 0853   RDW 14.5 07/02/2013 0853   LYMPHSABS 0.4* 07/02/2013 0853   MONOABS 0.4 07/02/2013 0853   EOSABS 0.1 07/02/2013 0853   BASOSABS 0.0 07/02/2013 0853      Chemistry      Component Value Date/Time   NA 141 07/02/2013 0853   K 3.9 07/02/2013 0853   CL 106 07/02/2013 0853   CO2 23 07/02/2013 0853   BUN 10 07/02/2013 0853   CREATININE 1.33 07/02/2013 0853      Component Value Date/Time   CALCIUM 9.1 07/02/2013 0853   ALKPHOS 124* 07/02/2013 0853   AST 31 07/02/2013 0853   ALT 16 07/02/2013 0853   BILITOT 1.4* 07/02/2013 0853     Lab Results  Component Value Date   CEA 4.3 05/17/2013     RADIOGRAPHIC STUDIES:  Ct Chest Wo Contrast  07/01/2013   CLINICAL DATA:  Chest wall pain  EXAM: CT CHEST WITHOUT CONTRAST  TECHNIQUE: Multidetector CT imaging of the chest was performed following the standard protocol without IV contrast.  COMPARISON:  CT ABD - PELV W/ CM dated 04/18/2013  FINDINGS: The thoracic inlet is unremarkable.  A porta catheter is identified within the chest wall on the left tip projecting in the region of the superior vena caval right atrial junction. Atherosclerotic calcifications identified within the coronary vessels. Atherosclerotic calcifications also identified within the aortic arch. There is mild to moderate multi chamber cardiac enlargement. There is no evidence of mediastinal nor hilar adenopathy.  There is decreased conspicuity of the radiation change within the right lung apex greatest confluence appreciated on image 15 series 3. The and pulmonary nodule within the medial aspect of the right  upper lobe image 15 series 3 has increased in size when compared to the previous study measuring 7.5 mm and diameter. Date is second of pulmonary nodule is identified within the medial aspect of the left upper lobe image 27 series 3 measuring 9 mm in diameter. A stable area of subpleural scarring is identified within the left lung base. There is no evidence of focal infiltrates, effusions, nor edema. There is no evidence of pulmonary masses. The central airways are patent.  The visualized upper abdominal viscera demonstrates a stable bilateral renal cysts. Patient is status post cholecystectomy. The spleen once again appears enlarged. Remaining visualized upper abdominal viscera otherwise grossly unremarkable.  The osseous structures demonstrate no evidence of aggressive appearing osseous lesions. Multilevel spondylolysis is once again appreciated. The soft tissues of the chest wall are unremarkable.  IMPRESSION: Increased size and conspicuity of the right upper lobe nodule and interval development of a left upper lobe nodule. These findings are consistent with areas of metastatic disease considering the patient's history until proven otherwise.  Splenomegaly is once again appreciated. Otherwise stable findings within the abdomen, stable lymph node within the right costophrenic region considering the patient's history attention to this area on subsequent imaging is recommended.  There is no CT evidence reflecting an etiology for the patient's complaints of chest wall pain.  Decreased conspicuity of the post radiation changes in the right upper lobe.  These results will be called to the ordering clinician or representative by the Radiologist Assistant, and communication documented in the PACS Dashboard.   Electronically Signed   By: Margaree Mackintosh M.D.   On: 07/01/2013 10:47      ASSESSMENT:  1. 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 S/P 24 treatments of FOLFOX + Avastin, on  chemotherapy holiday since Feb 2014. CT scan on 04/18/2013 shows a new right apex of lung nodule measuring 4.5 mm but no other indication of disease.  Follow-up CT scan demonstrates enlargement of the lesion to 7.5 mm and a new 9 mm lesion in the left upper lobe.   2. S/P radiation by Dr. [name] to right lung lesion on 08/06/2012, 08/09/2012, 08/13/2012  3. New right apex of lung nodule measuring 7.5 mm, concerning for malignancy. Metastatic versus bronchogenic 4. New left upper lobe lesion measuring 9 mm, concerning for malignancy. Metastatic versus bronchogenic 5. Slowly rising CEA, but remains WNL.  6. Pancytopenia, stable  7. Splenomegaly 8. Pernicious anemia, on Vitamin B 12 IM replacmenet 9. Small bowel erosions per camera capsule study by Dr. Gala Romney on 05/03/2013. 10. Recurrent renal calculi  Patient Active Problem List   Diagnosis Date Noted  . GI bleeding 05/01/2013  . Thrombocytopenia 05/01/2013  . Acute on chronic renal insufficiency 05/01/2013  . Hx of  radiation therapy   . Port catheter in place 09/04/2012  . B12 deficiency 12/07/2010  . Pancytopenia, acquired 12/04/2010  . DIABETES MELLITUS, TYPE II 07/07/2010  . HYPERCHOLESTEROLEMIA 07/07/2010  . ANEMIA, CHRONIC 07/07/2010  . CORONARY ARTERY DISEASE 07/07/2010  . SLEEP APNEA 07/07/2010  . Adenocarcinoma of rectum 07/07/2010     PLAN:  1. I personally reviewed and went over laboratory results with the patient.  The results follow below. 2. I personally reviewed and went over radiographic studies with the patient.  Results follow below.  3. Vitamin B 12 1000 mcg injection today. 4. Supportive therapy plan updated and reviewed.  5. Continue monthly Vitamin B 12 1031mg IM injections 6. Message sent to Dr[facility] CT scan results.  7. Will discuss the role of a PET Scan with Dr. MTammi Klippelas the size of the two worrisome lesions are at the borderline threshold of a PET scan (0.7 cm- 1 cm).  8.  Labs today: CBC diff, CMET, CEA 9. Labs in 4 weeks: CBC diff, CMET, CEA 10. Rx refill for Hydrocodone 11. Referral to Urology 12. Return in 4 weeks for follow-up.   THERAPY PLAN:  Rashawn's most recent CT scan is worrisome for recurrent metastatic disease to lungs only.  He appears to have a 7.5 mm lesion in the right apex of lung and a 9 mm (new as of CT scan on 07/01/13) lesion in the left upper lobe.  He may be a solid candidate for SBRT.  I will order a PET scan to evaluate for metabolic activity due to the size of these lesion being extremely difficult to biopsy.  All questions were answered. The patient knows to call the clinic with any problems, questions or concerns. We can certainly see the patient much sooner if necessary.  Patient and plan discussed with Dr. GFarrel Gobbleand he is in agreement with the aforementioned.   Yasemin Rabon

## 2013-07-02 ENCOUNTER — Encounter (HOSPITAL_COMMUNITY): Payer: Managed Care, Other (non HMO) | Attending: Oncology | Admitting: Oncology

## 2013-07-02 ENCOUNTER — Encounter (HOSPITAL_COMMUNITY): Payer: Self-pay | Admitting: Oncology

## 2013-07-02 ENCOUNTER — Telehealth (HOSPITAL_COMMUNITY): Payer: Self-pay | Admitting: *Deleted

## 2013-07-02 VITALS — BP 132/76 | HR 76 | Temp 97.4°F | Resp 18 | Wt 222.4 lb

## 2013-07-02 DIAGNOSIS — N200 Calculus of kidney: Secondary | ICD-10-CM

## 2013-07-02 DIAGNOSIS — Z9889 Other specified postprocedural states: Secondary | ICD-10-CM | POA: Insufficient documentation

## 2013-07-02 DIAGNOSIS — E538 Deficiency of other specified B group vitamins: Secondary | ICD-10-CM | POA: Insufficient documentation

## 2013-07-02 DIAGNOSIS — M25511 Pain in right shoulder: Secondary | ICD-10-CM

## 2013-07-02 DIAGNOSIS — D510 Vitamin B12 deficiency anemia due to intrinsic factor deficiency: Secondary | ICD-10-CM

## 2013-07-02 DIAGNOSIS — C787 Secondary malignant neoplasm of liver and intrahepatic bile duct: Secondary | ICD-10-CM

## 2013-07-02 DIAGNOSIS — C7800 Secondary malignant neoplasm of unspecified lung: Secondary | ICD-10-CM

## 2013-07-02 DIAGNOSIS — Z95828 Presence of other vascular implants and grafts: Secondary | ICD-10-CM

## 2013-07-02 DIAGNOSIS — M25519 Pain in unspecified shoulder: Secondary | ICD-10-CM | POA: Insufficient documentation

## 2013-07-02 DIAGNOSIS — C20 Malignant neoplasm of rectum: Secondary | ICD-10-CM | POA: Insufficient documentation

## 2013-07-02 LAB — CBC WITH DIFFERENTIAL/PLATELET
BASOS PCT: 0 % (ref 0–1)
Basophils Absolute: 0 10*3/uL (ref 0.0–0.1)
EOS ABS: 0.1 10*3/uL (ref 0.0–0.7)
Eosinophils Relative: 3 % (ref 0–5)
HEMATOCRIT: 29.3 % — AB (ref 39.0–52.0)
HEMOGLOBIN: 9.4 g/dL — AB (ref 13.0–17.0)
LYMPHS ABS: 0.4 10*3/uL — AB (ref 0.7–4.0)
Lymphocytes Relative: 11 % — ABNORMAL LOW (ref 12–46)
MCH: 26.7 pg (ref 26.0–34.0)
MCHC: 32.1 g/dL (ref 30.0–36.0)
MCV: 83.2 fL (ref 78.0–100.0)
MONO ABS: 0.4 10*3/uL (ref 0.1–1.0)
MONOS PCT: 10 % (ref 3–12)
Neutro Abs: 3.1 10*3/uL (ref 1.7–7.7)
Neutrophils Relative %: 77 % (ref 43–77)
Platelets: 49 10*3/uL — ABNORMAL LOW (ref 150–400)
RBC: 3.52 MIL/uL — ABNORMAL LOW (ref 4.22–5.81)
RDW: 14.5 % (ref 11.5–15.5)
WBC: 4.1 10*3/uL (ref 4.0–10.5)

## 2013-07-02 LAB — COMPREHENSIVE METABOLIC PANEL
ALBUMIN: 3.1 g/dL — AB (ref 3.5–5.2)
ALK PHOS: 124 U/L — AB (ref 39–117)
ALT: 16 U/L (ref 0–53)
AST: 31 U/L (ref 0–37)
BUN: 10 mg/dL (ref 6–23)
CO2: 23 mEq/L (ref 19–32)
CREATININE: 1.33 mg/dL (ref 0.50–1.35)
Calcium: 9.1 mg/dL (ref 8.4–10.5)
Chloride: 106 mEq/L (ref 96–112)
GFR calc non Af Amer: 57 mL/min — ABNORMAL LOW (ref >90)
GFR, EST AFRICAN AMERICAN: 66 mL/min — AB (ref >90)
GLUCOSE: 163 mg/dL — AB (ref 70–99)
POTASSIUM: 3.9 meq/L (ref 3.7–5.3)
Sodium: 141 mEq/L (ref 137–147)
Total Bilirubin: 1.4 mg/dL — ABNORMAL HIGH (ref 0.3–1.2)
Total Protein: 7.1 g/dL (ref 6.0–8.3)

## 2013-07-02 MED ORDER — CYANOCOBALAMIN 1000 MCG/ML IJ SOLN
1000.0000 ug | Freq: Once | INTRAMUSCULAR | Status: AC
  Administered 2013-07-02: 1000 ug via INTRAMUSCULAR

## 2013-07-02 MED ORDER — CYANOCOBALAMIN 1000 MCG/ML IJ SOLN
INTRAMUSCULAR | Status: AC
  Filled 2013-07-02: qty 1

## 2013-07-02 MED ORDER — SODIUM CHLORIDE 0.9 % IJ SOLN
10.0000 mL | INTRAMUSCULAR | Status: DC | PRN
  Administered 2013-07-02: 10 mL via INTRAVENOUS

## 2013-07-02 MED ORDER — HYDROCODONE-ACETAMINOPHEN 5-325 MG PO TABS: 1.0000 | ORAL_TABLET | Freq: Four times a day (QID) | ORAL | Status: DC | PRN

## 2013-07-02 MED ORDER — HEPARIN SOD (PORK) LOCK FLUSH 100 UNIT/ML IV SOLN
500.0000 [IU] | Freq: Once | INTRAVENOUS | Status: AC
Start: 2013-07-02 — End: 2013-07-02
  Administered 2013-07-02: 500 [IU] via INTRAVENOUS
  Filled 2013-07-02: qty 5

## 2013-07-02 NOTE — Progress Notes (Signed)
[name] presented for Portacath access and flush. Proper placement of portacath confirmed by CXR. Portacath located left chest wall accessed with  H 20 needle. Good blood return present. Portacath flushed with 28ml NS and 500U/27ml Heparin and needle removed intact. Procedure without incident. Patient tolerated procedure well.  [name] presents today for injection per MD orders. B12 1000 mcg administered IM in left Upper Arm. Administration without incident. Patient tolerated well.

## 2013-07-02 NOTE — Patient Instructions (Signed)
[facility] Discharge Instructions  RECOMMENDATIONS MADE BY THE CONSULTANT AND ANY TEST RESULTS WILL BE SENT TO YOUR REFERRING PHYSICIAN.  EXAM FINDINGS BY THE PHYSICIAN TODAY AND SIGNS OR SYMPTOMS TO REPORT TO CLINIC OR PRIMARY PHYSICIAN: Exam and findings as discussed by Robynn Pane, PA-C.  Waiting to hear back from Dr. [name] regarding radiation, will call you with appointment day and time.  Will make a referral to urologist regarding your kidney stones.  MEDICATIONS PRESCRIBED:  Hydrocodone - take as directed  INSTRUCTIONS/FOLLOW-UP: Follow-up in 4 weeks with lab work, B12 injection and follow-up.  Thank you for choosing [facility] to provide your oncology and hematology care.  To afford each patient quality time with our providers, please arrive at least 15 minutes before your scheduled appointment time.  With your help, our goal is to use those 15 minutes to complete the necessary work-up to ensure our physicians have the information they need to help with your evaluation and healthcare recommendations.    Effective January 1st, 2014, we ask that you re-schedule your appointment with our physicians should you arrive 10 or more minutes late for your appointment.  We strive to give you quality time with our providers, and arriving late affects you and other patients whose appointments are after yours.    Again, thank you for choosing [facility].  Our hope is that these requests will decrease the amount of time that you wait before being seen by our physicians.       _____________________________________________________________  Should you have questions after your visit to [facility], please contact our office at [phone] between the hours of 8:30 a.m. and 5:00 p.m.  Voicemails left after 4:30 p.m. will not be returned until the following business day.  For prescription refill requests, have your pharmacy contact our  office with your prescription refill request.

## 2013-07-02 NOTE — Telephone Encounter (Signed)
Trason has an appointment with Dr. Jeffie Pollock ([facility]) in [place] on March 6th at 2:45PM

## 2013-07-03 ENCOUNTER — Other Ambulatory Visit (HOSPITAL_COMMUNITY): Payer: Managed Care, Other (non HMO)

## 2013-07-03 LAB — CEA: CEA: 5.4 ng/mL — AB (ref 0.0–5.0)

## 2013-07-09 ENCOUNTER — Encounter: Payer: Self-pay | Admitting: Radiation Oncology

## 2013-07-09 DIAGNOSIS — C7801 Secondary malignant neoplasm of right lung: Secondary | ICD-10-CM | POA: Insufficient documentation

## 2013-07-09 DIAGNOSIS — C7800 Secondary malignant neoplasm of unspecified lung: Secondary | ICD-10-CM

## 2013-07-09 HISTORY — DX: Secondary malignant neoplasm of right lung: C78.01

## 2013-07-09 HISTORY — DX: Secondary malignant neoplasm of unspecified lung: C78.00

## 2013-07-09 NOTE — Progress Notes (Signed)
Histology and Location of Primary Cancer: rectak ca  Location(s) of Symptomatic tumor(s): 9 mm left upper lobe lung lesion and 7.5 mm right upper lobe lung lesion  Past/Anticipated chemotherapy by medical oncology, if any: Received 23 treatments of Folfox and Avastin  In 2013  Patient's main complaints related to symptomatic tumor(s) are:   Pain on a scale of 0-10 is:     If Spine Met(s), symptoms, if any, include:  Bowel/Bladder retention or incontinence (please describe): continues to pass renal calculi thus, Dr. Sheldon Silvan referred patient to urologist  Numbness or weakness in extremities (please describe):   Current Decadron regimen, if applicable:   Ambulatory status? Walker? Wheelchair?: ambulatory  SAFETY ISSUES:  Prior radiation? YES  Pacemaker/ICD? NO  Possible current pregnancy? NO  Is the patient on methotrexate? NO  Additional Complaints / other details:  63 year old male. Candidate for SBRT.

## 2013-07-10 ENCOUNTER — Encounter: Payer: Self-pay | Admitting: Radiation Oncology

## 2013-07-10 ENCOUNTER — Ambulatory Visit
Admission: RE | Admit: 2013-07-10 | Discharge: 2013-07-10 | Disposition: A | Discharge status: Home / Self Care | Payer: Managed Care, Other (non HMO) | Source: Ambulatory Visit | Attending: Radiation Oncology | Admitting: Radiation Oncology

## 2013-07-10 VITALS — BP 129/74 | HR 67 | Temp 98.0°F | Resp 16 | Ht 70.0 in | Wt 222.7 lb

## 2013-07-10 DIAGNOSIS — C7800 Secondary malignant neoplasm of unspecified lung: Secondary | ICD-10-CM | POA: Insufficient documentation

## 2013-07-10 DIAGNOSIS — C20 Malignant neoplasm of rectum: Secondary | ICD-10-CM

## 2013-07-10 DIAGNOSIS — Z510 Encounter for antineoplastic radiation therapy: Secondary | ICD-10-CM | POA: Diagnosis present

## 2013-07-10 DIAGNOSIS — R918 Other nonspecific abnormal finding of lung field: Secondary | ICD-10-CM | POA: Insufficient documentation

## 2013-07-10 DIAGNOSIS — Z923 Personal history of irradiation: Secondary | ICD-10-CM | POA: Insufficient documentation

## 2013-07-10 NOTE — Progress Notes (Signed)
Patient denies pain at this time. However, patient reports that he occasionally takes vicodin for low back pain. Patient questions if his low back pain could be related to frequent kidney stones. Patient scheduled to see a urologist on 08/02/2013 reference kidney stones and hematuria. Patient has a colostomy bag but, denies seeing blood in stool recently. Reports a productive cough with white to yellow sputum. Denies hemoptysis. Reports shortness of breath when "out in the cold weather." Denies difficulty swallowing. Reports his chest feels tight when he takes a deep breath. Denies nausea, vomiting, headache or dizziness. Denies night sweats. Reports weight stable. Reports hematuria.

## 2013-07-10 NOTE — Progress Notes (Signed)
See progress note under physician encounter. 

## 2013-07-10 NOTE — Progress Notes (Signed)
Radiation Oncology         [phone] ________________________________  Name: [name] MRN: [id number]  Date: 07/10/2013  DOB: [birth year]  Follow-Up Visit Note  CC: [name], MD  [facility], PA-C  Diagnosis:   63 year old gentleman with bilateral upper lung oligo metastatic deposits from stage IV rectal cancer  Interval Since Last Radiation:  10  months  Narrative:  The patient returns today to discuss to enlarging lung nodules noted on recent chest CT imaging.   He is essentially asymptomatic.                           ALLERGIES:  is allergic to daypro.  Meds: Current Outpatient Prescriptions  Medication Sig Dispense Refill  . cyanocobalamin (,VITAMIN B-12,) 1000 MCG/ML injection Inject 1,000 mcg into the muscle every 30 (thirty) days.       Marland Kitchen HYDROcodone-acetaminophen (NORCO/VICODIN) 5-325 MG per tablet Take 1-2 tablets by mouth every 6 (six) hours as needed.  60 tablet  0  . lidocaine-prilocaine (EMLA) cream Apply 1 application topically as needed. Apply quarter sized amount over port-a-cath site one hour prior to chemotherapy appointment.      . temazepam (RESTORIL) 15 MG capsule Take 1 capsule (15 mg total) by mouth at bedtime as needed for sleep.  30 capsule  1  . cyclobenzaprine (FLEXERIL) 10 MG tablet Take 1 tablet (10 mg total) by mouth 3 (three) times daily as needed for muscle spasms.  30 tablet  0   No current facility-administered medications for this encounter.   Facility-Administered Medications Ordered in Other Encounters  Medication Dose Route Frequency Provider Last Rate Last Dose  . 0.9 %  sodium chloride infusion   Intravenous Continuous Pieter Partridge, MD 20 mL/hr at 07/17/12 1113    . heparin lock flush 100 unit/mL  500 Units Intravenous Once Pieter Partridge, MD      . sodium chloride 0.9 % injection 10 mL  10 mL Intravenous PRN Pieter Partridge, MD   10 mL at 07/17/12 1113    Physical Findings: The patient is in no acute  distress. Patient is alert and oriented.  height is 5\' 10"  (1.778 m) and weight is 222 lb 11.2 oz (101.016 kg). His oral temperature is 98 F (36.7 C). His blood pressure is 129/74 and his pulse is 67. His respiration is 16 and oxygen saturation is 98%. .  No significant changes.  Lab Findings: Lab Results  Component Value Date   WBC 4.1 07/02/2013   HGB 9.4* 07/02/2013   HCT 29.3* 07/02/2013   MCV 83.2 07/02/2013   PLT 49* 07/02/2013    @LASTCHEM @  Radiographic Findings: Ct Chest Wo Contrast  07/01/2013   CLINICAL DATA:  Chest wall pain  EXAM: CT CHEST WITHOUT CONTRAST  TECHNIQUE: Multidetector CT imaging of the chest was performed following the standard protocol without IV contrast.  COMPARISON:  CT ABD - PELV W/ CM dated 04/18/2013  FINDINGS: The thoracic inlet is unremarkable.  A porta catheter is identified within the chest wall on the left tip projecting in the region of the superior vena caval right atrial junction. Atherosclerotic calcifications identified within the coronary vessels. Atherosclerotic calcifications also identified within the aortic arch. There is mild to moderate multi chamber cardiac enlargement. There is no evidence of mediastinal nor hilar adenopathy.  There is decreased conspicuity of the radiation change within the right lung apex greatest confluence appreciated on image  15 series 3. The and pulmonary nodule within the medial aspect of the right upper lobe image 15 series 3 has increased in size when compared to the previous study measuring 7.5 mm and diameter. Date is second of pulmonary nodule is identified within the medial aspect of the left upper lobe image 27 series 3 measuring 9 mm in diameter. A stable area of subpleural scarring is identified within the left lung base. There is no evidence of focal infiltrates, effusions, nor edema. There is no evidence of pulmonary masses. The central airways are patent.  The visualized upper abdominal viscera demonstrates a stable  bilateral renal cysts. Patient is status post cholecystectomy. The spleen once again appears enlarged. Remaining visualized upper abdominal viscera otherwise grossly unremarkable.  The osseous structures demonstrate no evidence of aggressive appearing osseous lesions. Multilevel spondylolysis is once again appreciated. The soft tissues of the chest wall are unremarkable.  IMPRESSION: Increased size and conspicuity of the right upper lobe nodule and interval development of a left upper lobe nodule. These findings are consistent with areas of metastatic disease considering the patient's history until proven otherwise.  Splenomegaly is once again appreciated. Otherwise stable findings within the abdomen, stable lymph node within the right costophrenic region considering the patient's history attention to this area on subsequent imaging is recommended.  There is no CT evidence reflecting an etiology for the patient's complaints of chest wall pain.  Decreased conspicuity of the post radiation changes in the right upper lobe.  These results will be called to the ordering clinician or representative by the Radiologist Assistant, and communication documented in the PACS Dashboard.   Electronically Signed   By: Margaree Mackintosh M.D.   On: 07/01/2013 10:47    Impression:  The patient is recovering from the effects of radiation.  This patient is a very nice 63 year old gentleman status post previous stereotactic body radiotherapy for a lung metastasis with excellent local control. He now manifests 2 new subcentimeter lung nodules amenable to stereotactic body radiotherapy in bilateral upper lungs.  Plan: Today, I talked to the patient and family about the 2 new nodules.  We discussed the natural history of disease and general treatment, highlighting the role or stereotactic body radiotherapy in the management.  We discussed the logistics and delivery.  We reviewed the anticipated acute and late sequelae associated with  radiation in this setting.  The patient's previous courses of stereotactic body radiotherapy would be quite similar to what could be performed now. Accordingly, he is aware of the procedures and anticipated effects. Last time, he had minimal fatigue as his only side effect. The patient was encouraged to ask questions that I answered to the best of my ability.  The patient would like to proceed with radiation and will be scheduled for CT simulation.  I spent 30 minutes minutes face to face with the patient and more than 50% of that time was spent in counseling and/or coordination of care. _____________________________________  Sheral Apley. [name], M.D.

## 2013-07-10 NOTE — Progress Notes (Signed)
  Radiation Oncology         [phone]) [phone] ________________________________  Name: [name] MRN: [id number]  Date: 07/10/2013  DOB: 1950/11/26  STEREOTACTIC BODY RADIOTHERAPY SIMULATION AND TREATMENT PLANNING NOTE  DIAGNOSIS:  63 year old gentleman with 2 isolated lung metastases from rectal cancer  NARRATIVE:  The patient was brought to the [facility].  Identity was confirmed.  All relevant records and images related to the planned course of therapy were reviewed.  The patient freely provided informed written consent to proceed with treatment after reviewing the details related to the planned course of therapy. The consent form was witnessed and verified by the simulation staff.  Then, the patient was set-up in a stable reproducible  supine position for radiation therapy.  A BodyFix immobilization pillow was fabricated for reproducible positioning.  Then I personally applied the abdominal compression paddle to limit respiratory excursion.  4D respiratoy motion management CT images were obtained.  Surface markings were placed.  The CT images were loaded into the planning software.  Then, using Cine, MIP, and standard views, the internal target volume (ITV) and planning target volumes (PTV) were delinieated, and avoidance structures were contoured.  Treatment planning then occurred.  The radiation prescription was entered and confirmed.  A total of two complex treatment devices were fabricated in the form of the BodyFix immobilization pillow and a neck accuform cushion.  I have requested : 3D Simulation  I have requested a DVH of the following structures: Heart, Lungs, Esophagus, Chest Wall, Brachial Plexus, Major Blood Vessels, and targets.  PLAN:  The patient will receive 54 Gy in one fraction to his right and left upper lobe metastases .  ________________________________  Sheral Apley [name], M.D.

## 2013-07-11 ENCOUNTER — Other Ambulatory Visit: Payer: Self-pay | Admitting: Radiation Oncology

## 2013-07-15 DIAGNOSIS — Z510 Encounter for antineoplastic radiation therapy: Secondary | ICD-10-CM | POA: Diagnosis not present

## 2013-07-16 DIAGNOSIS — Z510 Encounter for antineoplastic radiation therapy: Secondary | ICD-10-CM | POA: Diagnosis not present

## 2013-07-22 ENCOUNTER — Ambulatory Visit
Admission: RE | Admit: 2013-07-22 | Discharge: 2013-07-22 | Disposition: A | Discharge status: Home / Self Care | Payer: Managed Care, Other (non HMO) | Source: Ambulatory Visit | Attending: Radiation Oncology | Admitting: Radiation Oncology

## 2013-07-22 DIAGNOSIS — Z510 Encounter for antineoplastic radiation therapy: Secondary | ICD-10-CM | POA: Diagnosis not present

## 2013-07-22 DIAGNOSIS — C20 Malignant neoplasm of rectum: Secondary | ICD-10-CM

## 2013-07-22 NOTE — Progress Notes (Signed)
  Radiation Oncology         [phone]) [phone] ________________________________  Name: OREE HISLOP MRN: [id number]  Date: 07/22/2013  DOB: 1950/09/22  Stereotactic Body Radiotherapy Treatment Procedure Note  CURRENT FRACTION:    1  PLANNED FRACTIONS:  5  NARRATIVE:  [name] was brought to the stereotactic radiation treatment machine and placed supine on the CT couch. The patient was set up for stereotactic body radiotherapy on the body fix pillow.  3D TREATMENT PLANNING AND DOSIMETRY:  The patient's radiation plan was reviewed and approved prior to starting treatment.  It showed 3-dimensional radiation distributions overlaid onto the planning CT.  The [facility] for the target structures as well as the organs at risk were reviewed. The documentation of this is filed in the radiation oncology EMR.  SIMULATION VERIFICATION:  The patient underwent CT imaging on the treatment unit.  These were carefully aligned to document that the ablative radiation dose would cover the target volume and maximally spare the nearby organs at risk according to the planned distribution.  SPECIAL TREATMENT PROCEDURE: [name] received high dose ablative stereotactic body radiotherapy to the planned target volume without unforeseen complications. Treatment was delivered uneventfully. The high doses associated with stereotactic body radiotherapy and the significant potential risks require careful treatment set up and patient monitoring constituting a special treatment procedure   STEREOTACTIC TREATMENT MANAGEMENT:  Following delivery, the patient was evaluated clinically. The patient tolerated treatment without significant acute effects, and was discharged to home in stable condition.    PLAN: Continue treatment as planned.  ________________________________  Sheral Apley. [name], M.D.

## 2013-07-24 ENCOUNTER — Ambulatory Visit
Admission: RE | Admit: 2013-07-24 | Discharge: 2013-07-24 | Disposition: A | Discharge status: Home / Self Care | Payer: Managed Care, Other (non HMO) | Source: Ambulatory Visit | Attending: Radiation Oncology | Admitting: Radiation Oncology

## 2013-07-24 ENCOUNTER — Encounter: Payer: Self-pay | Admitting: Radiation Oncology

## 2013-07-24 DIAGNOSIS — C20 Malignant neoplasm of rectum: Secondary | ICD-10-CM

## 2013-07-24 DIAGNOSIS — C7800 Secondary malignant neoplasm of unspecified lung: Secondary | ICD-10-CM

## 2013-07-24 DIAGNOSIS — Z510 Encounter for antineoplastic radiation therapy: Secondary | ICD-10-CM | POA: Diagnosis not present

## 2013-07-24 NOTE — Progress Notes (Signed)
  Radiation Oncology         [phone]) [phone] ________________________________  Name: [name] MRN: [id number]  Date: 07/24/2013  DOB: 10-Dec-1950  Stereotactic Body Radiotherapy Treatment Procedure Note  CURRENT FRACTION:    2  PLANNED FRACTIONS:  5  NARRATIVE:  [name] was brought to the stereotactic radiation treatment machine and placed supine on the CT couch. The patient was set up for stereotactic body radiotherapy on the body fix pillow.  3D TREATMENT PLANNING AND DOSIMETRY:  The patient's radiation plan was reviewed and approved prior to starting treatment.  It showed 3-dimensional radiation distributions overlaid onto the planning CT.  The [facility] for the target structures as well as the organs at risk were reviewed. The documentation of this is filed in the radiation oncology EMR.  SIMULATION VERIFICATION:  The patient underwent CT imaging on the treatment unit.  These were carefully aligned to document that the ablative radiation dose would cover the target volume and maximally spare the nearby organs at risk according to the planned distribution.  SPECIAL TREATMENT PROCEDURE: [name] received high dose ablative stereotactic body radiotherapy to the planned target volume without unforeseen complications. Treatment was delivered uneventfully. The high doses associated with stereotactic body radiotherapy and the significant potential risks require careful treatment set up and patient monitoring constituting a special treatment procedure   STEREOTACTIC TREATMENT MANAGEMENT:  Following delivery, the patient was evaluated clinically. The patient tolerated treatment without significant acute effects, and was discharged to home in stable condition.    PLAN: Continue treatment as planned.  ________________________________  Sheral Apley. [name], M.D.

## 2013-07-25 ENCOUNTER — Other Ambulatory Visit (HOSPITAL_COMMUNITY): Payer: Self-pay

## 2013-07-29 ENCOUNTER — Ambulatory Visit
Admission: RE | Admit: 2013-07-29 | Discharge: 2013-07-29 | Disposition: A | Discharge status: Home / Self Care | Payer: Managed Care, Other (non HMO) | Source: Ambulatory Visit | Attending: Radiation Oncology | Admitting: Radiation Oncology

## 2013-07-29 DIAGNOSIS — C7800 Secondary malignant neoplasm of unspecified lung: Secondary | ICD-10-CM

## 2013-07-29 DIAGNOSIS — Z510 Encounter for antineoplastic radiation therapy: Secondary | ICD-10-CM | POA: Diagnosis not present

## 2013-07-30 ENCOUNTER — Ambulatory Visit (HOSPITAL_COMMUNITY): Payer: Managed Care, Other (non HMO) | Admitting: Oncology

## 2013-07-30 ENCOUNTER — Other Ambulatory Visit (HOSPITAL_COMMUNITY): Payer: Managed Care, Other (non HMO)

## 2013-07-30 ENCOUNTER — Ambulatory Visit (HOSPITAL_COMMUNITY): Payer: Managed Care, Other (non HMO)

## 2013-07-30 NOTE — Progress Notes (Signed)
  Radiation Oncology         [phone]) [phone] ________________________________  Name: [name] MRN: [id number]  Date: 07/29/2013  DOB: 12/20/1950   Simulation verification note  The patient underwent film verification for the patient's set-up in preparation for stereotactic body radiosurgery. The patient was placed on the treatment unit and a CT scan was performed. These images were then fused with the patient's planning CT scan. The fusion was carefully reviewed in terms of the patient's anatomy as it related to the planning CT scan. The target structures as well as the organs at risk were evaluated on the patient's treatment CT scan. The target and the normal structures were appropriately aligned for treatment. Therefore the patient proceeded with the fraction of stereotactic body radiosurgery.  Fraction: 3  Dose:  36 Gy   ________________________________  Jodelle Gross, MD, PhD

## 2013-07-31 ENCOUNTER — Ambulatory Visit
Admission: RE | Admit: 2013-07-31 | Discharge: 2013-07-31 | Disposition: A | Discharge status: Home / Self Care | Payer: Managed Care, Other (non HMO) | Source: Ambulatory Visit | Attending: Radiation Oncology | Admitting: Radiation Oncology

## 2013-07-31 ENCOUNTER — Encounter: Payer: Self-pay | Admitting: Radiation Oncology

## 2013-07-31 DIAGNOSIS — Z510 Encounter for antineoplastic radiation therapy: Secondary | ICD-10-CM | POA: Diagnosis not present

## 2013-07-31 NOTE — Progress Notes (Signed)
  Radiation Oncology         [phone]) [phone] ________________________________  Name: KHALIL SZCZEPANIK MRN: [id number]  Date: 07/31/2013  DOB: 08-Nov-1950  Stereotactic Body Radiotherapy Treatment Procedure Note  Fraction: 4 of 5 Dose thus far: 48 of 60 Gy  NARRATIVE:  Roni R Uhlig was brought to the stereotactic radiation treatment machine and placed supine on the CT couch. The patient was set up for stereotactic body radiotherapy on the body fix pillow.  3D TREATMENT PLANNING AND DOSIMETRY:  The patient's radiation plan was reviewed and approved prior to starting treatment.  It showed 3-dimensional radiation distributions overlaid onto the planning CT.  The [facility] for the target structures as well as the organs at risk were reviewed. The documentation of this is filed in the radiation oncology EMR.  SIMULATION VERIFICATION:  The patient underwent CT imaging on the treatment unit.  These were carefully aligned to document that the ablative radiation dose would cover the target volume and maximally spare the nearby organs at risk according to the planned distribution.  SPECIAL TREATMENT PROCEDURE: [name] received high dose ablative stereotactic body radiotherapy to the 2 planned target volumes without unforeseen complications. Treatment was delivered uneventfully. The high doses associated with stereotactic body radiotherapy and the significant potential risks require careful treatment set up and patient monitoring constituting a special treatment procedure   STEREOTACTIC TREATMENT MANAGEMENT:  Following delivery, the patient was evaluated clinically. The patient tolerated treatment without significant acute effects, and was discharged to home in stable condition.    PLAN: Continue treatment as planned.  ________________________________  Eppie Gibson, MD

## 2013-07-31 NOTE — Progress Notes (Signed)
-  Rescheduled due to inclement weather-  KEFALAS,THOMAS

## 2013-08-01 ENCOUNTER — Other Ambulatory Visit (HOSPITAL_COMMUNITY): Payer: Managed Care, Other (non HMO)

## 2013-08-01 ENCOUNTER — Ambulatory Visit (HOSPITAL_COMMUNITY): Payer: Managed Care, Other (non HMO)

## 2013-08-01 ENCOUNTER — Ambulatory Visit (HOSPITAL_COMMUNITY): Payer: Managed Care, Other (non HMO) | Admitting: Oncology

## 2013-08-02 ENCOUNTER — Encounter: Payer: Self-pay | Admitting: Radiation Oncology

## 2013-08-02 ENCOUNTER — Ambulatory Visit
Admission: RE | Admit: 2013-08-02 | Discharge: 2013-08-02 | Disposition: A | Discharge status: Home / Self Care | Payer: Managed Care, Other (non HMO) | Source: Ambulatory Visit | Attending: Radiation Oncology | Admitting: Radiation Oncology

## 2013-08-02 VITALS — BP 147/58 | HR 74 | Resp 16 | Wt 223.0 lb

## 2013-08-02 DIAGNOSIS — C20 Malignant neoplasm of rectum: Secondary | ICD-10-CM

## 2013-08-02 DIAGNOSIS — C7800 Secondary malignant neoplasm of unspecified lung: Secondary | ICD-10-CM

## 2013-08-02 DIAGNOSIS — Z510 Encounter for antineoplastic radiation therapy: Secondary | ICD-10-CM | POA: Diagnosis not present

## 2013-08-02 NOTE — Progress Notes (Signed)
Mild hyperpigmentation noted chest and back within treatment field without desquamation. Dry itchy skin reports around colostomy bag without redness. Reports occasional cough with yellow sputum. Denies difficulty swallowing. Reports occasional shortness of breath worse when he ambulated in the cold. Reports mild fatigue. Completed treatment today. Provided patient with one month follow up appointment card.

## 2013-08-03 ENCOUNTER — Encounter: Payer: Self-pay | Admitting: Radiation Oncology

## 2013-08-03 NOTE — Progress Notes (Signed)
  Radiation Oncology         [phone]) [phone] ________________________________  Name: [name] MRN: [id number]  Date: 08/02/2013  DOB: 1950-06-22  Stereotactic Body Radiotherapy Treatment Procedure Note  CURRENT FRACTION:    5  PLANNED FRACTIONS:  5  NARRATIVE:  [name] was brought to the stereotactic radiation treatment machine and placed supine on the CT couch. The patient was set up for stereotactic body radiotherapy on the body fix pillow.  3D TREATMENT PLANNING AND DOSIMETRY:  The patient's radiation plan was reviewed and approved prior to starting treatment.  It showed 3-dimensional radiation distributions overlaid onto the planning CT.  The [facility] for the target structures as well as the organs at risk were reviewed. The documentation of this is filed in the radiation oncology EMR.  SIMULATION VERIFICATION:  The patient underwent CT imaging on the treatment unit.  These were carefully aligned to document that the ablative radiation dose would cover the target volume and maximally spare the nearby organs at risk according to the planned distribution.  SPECIAL TREATMENT PROCEDURE: [name] received high dose ablative stereotactic body radiotherapy to the planned target volume without unforeseen complications. Treatment was delivered uneventfully. The high doses associated with stereotactic body radiotherapy and the significant potential risks require careful treatment set up and patient monitoring constituting a special treatment procedure   STEREOTACTIC TREATMENT MANAGEMENT:  Following delivery, the patient was evaluated clinically. The patient tolerated treatment without significant acute effects, and was discharged to home in stable condition.    PLAN: Finish treatment as planned.  ________________________________  Sheral Apley. [name], M.D.

## 2013-08-03 NOTE — Progress Notes (Signed)
  Radiation Oncology         [phone] ________________________________  Name: [name] MRN: [id number]  Date: 08/02/2013  DOB: Aug 20, 1950  Weekly Radiation Therapy Management  Current Dose: 60 Gy     Planned Dose:  60 Gy  Narrative . . . . . . . . The patient presents for routine under treatment assessment.                                   The patient is without complaint.                                 Set-up films were reviewed.                                 The chart was checked. Physical Findings. . .  weight is 223 lb (101.152 kg). His blood pressure is 147/58 and his pulse is 74. His respiration is 16. . Weight essentially stable.  No significant changes. Impression . . . . . . . The patient is tolerating radiation. Plan . . . . . . . . . . . . Follow-up in one month.  ________________________________  Sheral Apley. [name], M.D.

## 2013-08-09 ENCOUNTER — Other Ambulatory Visit (HOSPITAL_COMMUNITY): Payer: Self-pay

## 2013-08-09 DIAGNOSIS — C20 Malignant neoplasm of rectum: Secondary | ICD-10-CM

## 2013-08-10 NOTE — Progress Notes (Signed)
  Radiation Oncology         [phone]) [phone] ________________________________  Name: [name] MRN: [id number]  Date: 08/02/2013  DOB: 1950/06/09  End of Treatment Note  Diagnosis:   63 year old gentleman with 2 isolated lung metastases from rectal cancer  Indication for treatment:  Palliation, local control       Radiation treatment dates:   07/22/2013, 07/24/2013, 07/29/2013, 07/31/2013, 08/02/2013  Site/dose:    1.  The patient received 54 Gy in 3 fractions of 18 Gy to his right upper lobe metastasis  2.  The patient received 54 Gy in 3 fractions of 18 Gy to his left upper lobe metastasis   Beams/energy:    1.  His right upper lobe metastasis was treated using 3-D conformal radiotherapy with image guided cone beam CT prior to each fraction and immobilization according to body fix customized pillow. The 3-D technique employed was volumetric ARC therapy using 6 megavolt photons in the flattening filter free beam setting. Volumetric ARC treatment was required to 2 nearby organs at risk and dose shaping.  2.  His left upper lobe metastasis was treated using 3-D conformal radiotherapy with image guided cone beam CT prior to each fraction and immobilization according to body fix customized pillow. The 3-D technique employed was dynamic conformal arcs of treatment using to arcs with 6 megavolt photons delivered with flattening filter free beam mode.   Narrative: The patient tolerated radiation treatment relatively well.   No acute complications occurred.  Plan: The patient has completed radiation treatment. The patient will return to radiation oncology clinic for routine followup in one month. I advised them to call or return sooner if they have any questions or concerns related to their recovery or treatment. ________________________________  Sheral Apley. [name], M.D.

## 2013-08-11 NOTE — Progress Notes (Signed)
[name] Kilts, MD [address] [place] [address]  Adenocarcinoma of rectum - Plan: heparin lock flush 100 unit/mL, HYDROcodone-acetaminophen (NORCO/VICODIN) 5-325 MG per tablet, CBC with Differential, Comprehensive metabolic panel, CEA  Lung metastases  Shoulder pain, right - Plan: HYDROcodone-acetaminophen (NORCO/VICODIN) 5-325 MG per tablet  CURRENT THERAPY: SBRT for two pulmonary lesions under the care of Dr. Tyler Pita.  Surveillance for Stage IV rectal adenocarcinoma.  B12 injections every 4 weeks.   INTERVAL HISTORY: [name] 63 y.o. 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  Oncology History:  He has been on drug-holiday since February of 2014 after completing 24 cycles of FOLFOX and Avastin.  His last PET/CT result in May of 2014 showed; " Persistent malignant range FDG uptake associated with the right upper lobe nodule. The degree of FDG uptake is not significantly improved from previous exam. The nodule in the right upper lobe demonstrates interval cavitation compared with previous exam. Decrease in hypermetabolism associated with multifocal liver metastasis. On today's study there is no abnormal uptake within the liver above background activity." He has since undergone radiation therapy by Dr. [name] to this right lung lesion. CT CAP on 04/18/2013 shows a new right apex of the lung lesion measuring 4.5 mm in size and is medial to aforementioned lung lesion. Most recent CT scan on 07/01/2013 demonstrates an increase in size of the lung lesion to 7.5 mm and there is now a second lesion measuring 9 mm in size in the medial aspect of left upper lobe.  With these increasing lesions, the patient was referred to [facility] and he was seen by his primary Radiation Oncologist, Dr. Tyler Pita.  It was agreed that [name] qualified for SBRT treatment to these lesions and this was  performed on 2/23, 2/25, 3/3, and 08/02/2013.   I personally reviewed and went over laboratory results with the patient.  The results are noted within this dictation.  He is fatigued secondary to radiation therapy.  He is slowly recovering.  He denies any oncologically complaints.    He has an upcoming appointment with Dr. [name].  I do not see any imaging studies ordered.  I suspect Dr. [name] will want a CT of chest to evaluate response to radiation therapy.  I will coordinate timing of this exam with him and I will add a CT of abd/pelvis as well cine his last one was in November 2014.  He has an appointment with urology that he had to reschedule due to SBRT.  I have referred him for recurrent calculi.  He will reschedule this appointment in the near future.   Oncologically, the patient denies any complaints and ROS questioning is negative.   Past Medical History  Diagnosis Date  . Pancytopenia, acquired 12/04/2010  . B12 deficiency 12/07/2010  . Hypertension   . Diabetes mellitus   . Hypercholesteremia   . Kidney stones   . CAD (coronary artery disease)   . Anemia   . History of radiation therapy 06/19/07 -07/27/07    whole pelvis  . Hx of radiation therapy 08/06/12,08/09/12,& 08/13/12    rul 54GY/20f  . Cancer     Colon ca dx 2008 surg/rad/chemo  . Lung cancer   . FH: chemotherapy     has DIABETES MELLITUS, TYPE II; HYPERCHOLESTEROLEMIA; ANEMIA, CHRONIC; CORONARY ARTERY DISEASE; SLEEP APNEA; Adenocarcinoma of rectum; Pancytopenia, acquired; B12 deficiency; Port catheter in place;  Hx of radiation therapy; Thrombocytopenia; and Lung metastases on his problem list.     is allergic to daypro.  We administered cyanocobalamin and heparin lock flush.  Past Surgical History  Procedure Laterality Date  . Ileostomy  12/07/2010    Procedure: ILEOSTOMY;  Surgeon: Donato Heinz;  Location: [facility];  Service: General;  Laterality: N/A;  Diverting Ileostomy Lysis of adhesions Exploratory  Laparotomy  . Lung biopsy  10/27/10    rt lobe- adenocarcinoma  . Cholecystectomy    . Cataract extraction w/ intraocular lens implant  2007    bil  . Lithotripsy  2002  . Coronary angioplasty with stent placement  2003    stenting x 2  . Colon resection  2008  . Esophagogastroduodenoscopy N/A 05/02/2013    Procedure: ESOPHAGOGASTRODUODENOSCOPY (EGD);  Surgeon: Daneil Dolin, MD;  Location: [facility];  Service: Endoscopy;  Laterality: N/A;  . Givens capsule study N/A 05/02/2013    Procedure: GIVENS CAPSULE STUDY;  Surgeon: Daneil Dolin, MD;  Location: [facility];  Service: Endoscopy;  Laterality: N/A;    Denies any headaches, dizziness, double vision, fevers, chills, night sweats, nausea, vomiting, diarrhea, constipation, chest pain, heart palpitations, shortness of breath, blood in stool, black tarry stool, urinary pain, urinary burning, urinary frequency, hematuria.   PHYSICAL EXAMINATION  ECOG PERFORMANCE STATUS: 1 - Symptomatic but completely ambulatory  Filed Vitals:   08/13/13 1300  BP: 125/78  Pulse: 78  Temp: 98.4 F (36.9 C)  Resp: 18    GENERAL:alert, no distress, well nourished, well developed, comfortable, cooperative and smiling SKIN: skin color, texture, turgor are normal, no rashes or significant lesions HEAD: Normocephalic, No masses, lesions, tenderness or abnormalities EYES: normal, PERRLA, EOMI, Conjunctiva are pink and non-injected EARS: External ears normal OROPHARYNX:mucous membranes are moist  NECK: supple, no adenopathy, thyroid normal size, non-tender, without nodularity, no stridor, non-tender, trachea midline LYMPH:  no palpable lymphadenopathy, no hepatosplenomegaly BREAST:not examined LUNGS: clear to auscultation and percussion HEART: regular rate & rhythm, no murmurs, no gallops, S1 normal and S2 normal ABDOMEN:abdomen soft, non-tender, obese, normal bowel sounds, no masses or organomegaly and no hepatosplenomegaly BACK: Back symmetric,  no curvature., No CVA tenderness EXTREMITIES:less then 2 second capillary refill, no joint deformities, effusion, or inflammation, no edema, no skin discoloration, no clubbing, no cyanosis  NEURO: alert & oriented x 3 with fluent speech, no focal motor/sensory deficits, gait normal   LABORATORY DATA: CBC    Component Value Date/Time   WBC 4.1 07/02/2013 0853   RBC 3.52* 07/02/2013 0853   HGB 9.4* 07/02/2013 0853   HCT 29.3* 07/02/2013 0853   PLT 49* 07/02/2013 0853   MCV 83.2 07/02/2013 0853   MCH 26.7 07/02/2013 0853   MCHC 32.1 07/02/2013 0853   RDW 14.5 07/02/2013 0853   LYMPHSABS 0.4* 07/02/2013 0853   MONOABS 0.4 07/02/2013 0853   EOSABS 0.1 07/02/2013 0853   BASOSABS 0.0 07/02/2013 0853      Chemistry      Component Value Date/Time   NA 141 07/02/2013 0853   K 3.9 07/02/2013 0853   CL 106 07/02/2013 0853   CO2 23 07/02/2013 0853   BUN 10 07/02/2013 0853   CREATININE 1.33 07/02/2013 0853      Component Value Date/Time   CALCIUM 9.1 07/02/2013 0853   ALKPHOS 124* 07/02/2013 0853   AST 31 07/02/2013 0853   ALT 16 07/02/2013 0853   BILITOT 1.4* 07/02/2013 0853     Lab Results  Component  Value Date   CEA 5.4* 07/02/2013      ASSESSMENT:  1. 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 S/P 24 treatments of FOLFOX + Avastin, on chemotherapy holiday since Feb 2014.  2. Right apex of lung nodule (7.5 mm) and left upper lobe lesion (9 mm), concerning for malignancy treated with SBRT by Dr. [name] on 2/23, 2/25, 3/3, and 08/02/2013.  3. Pancytopenia, stable 4. Splenomegaly 5. Pernicious anemia, on Vitamin B 12 IM replacmenet 6. Small bowel erosions per camera capsule study by Dr. Gala Romney on 05/03/2013.  7. Recurrent renal calculi, referral to urology made.  Patient Active Problem List   Diagnosis Date Noted  . Lung metastases 07/09/2013  . Thrombocytopenia 05/01/2013  . Hx of radiation therapy   . Port catheter in place 09/04/2012  . B12 deficiency 12/07/2010  . Pancytopenia, acquired  12/04/2010  . DIABETES MELLITUS, TYPE II 07/07/2010  . HYPERCHOLESTEROLEMIA 07/07/2010  . ANEMIA, CHRONIC 07/07/2010  . CORONARY ARTERY DISEASE 07/07/2010  . SLEEP APNEA 07/07/2010  . Adenocarcinoma of rectum 07/07/2010    PLAN:  1. I personally reviewed and went over laboratory results with the patient.  The results are noted within this dictation. 2. I personally reviewed and went over radiographic studies with the patient.  The results are noted within this dictation.   3. Chart is reviewed 4. Vitamin B 12 1000 mcg injection today and every 4 weeks.  5. Will need to coordinate future imagine studies with Rad Onc (from a lung standpoint).  I would perform a CT of CAP (AP was last done in November 2014). 6. Labs today: CBC diff, CMET, CEA 7. Labs every 4 weeks: CBC diff, CMET, CEA 8. Rx for Hydrocodone refill 9. I will add PT/INR and aPTT to his labs due in 4 weeks due to reported easy bruising.  10. Return in 12 weeks.    THERAPY PLAN:  [name] will need CT of chest in the future to evaluate response to SBRT.  I will coordinate this test with Dr. [name].  Additionally, I will add abd/pelvis CT as well for restaging as his last one was in Nov 2014.  We will follow his labs (including CEA) every 4 weeks, continue with Vit B12 injections every 4 weeks, and see him back in 3 months for follow-up.  All questions were answered. The patient knows to call the clinic with any problems, questions or concerns. We can certainly see the patient much sooner if necessary.  Patient and plan discussed with Dr. Farrel Gobble and he is in agreement with the aforementioned.   [name] 08/13/2013

## 2013-08-13 ENCOUNTER — Encounter (HOSPITAL_COMMUNITY): Payer: Managed Care, Other (non HMO) | Attending: Oncology | Admitting: Oncology

## 2013-08-13 ENCOUNTER — Other Ambulatory Visit (HOSPITAL_COMMUNITY): Payer: Self-pay | Admitting: Oncology

## 2013-08-13 ENCOUNTER — Encounter (HOSPITAL_COMMUNITY): Payer: Managed Care, Other (non HMO)

## 2013-08-13 ENCOUNTER — Encounter (HOSPITAL_COMMUNITY): Payer: Self-pay | Admitting: Oncology

## 2013-08-13 VITALS — BP 125/78 | HR 78 | Temp 98.4°F | Resp 18 | Wt 223.8 lb

## 2013-08-13 DIAGNOSIS — D61818 Other pancytopenia: Secondary | ICD-10-CM

## 2013-08-13 DIAGNOSIS — C7800 Secondary malignant neoplasm of unspecified lung: Secondary | ICD-10-CM

## 2013-08-13 DIAGNOSIS — R58 Hemorrhage, not elsewhere classified: Secondary | ICD-10-CM

## 2013-08-13 DIAGNOSIS — C20 Malignant neoplasm of rectum: Secondary | ICD-10-CM

## 2013-08-13 DIAGNOSIS — M25519 Pain in unspecified shoulder: Secondary | ICD-10-CM | POA: Insufficient documentation

## 2013-08-13 DIAGNOSIS — C787 Secondary malignant neoplasm of liver and intrahepatic bile duct: Secondary | ICD-10-CM

## 2013-08-13 DIAGNOSIS — D649 Anemia, unspecified: Secondary | ICD-10-CM | POA: Insufficient documentation

## 2013-08-13 DIAGNOSIS — D510 Vitamin B12 deficiency anemia due to intrinsic factor deficiency: Secondary | ICD-10-CM

## 2013-08-13 DIAGNOSIS — D500 Iron deficiency anemia secondary to blood loss (chronic): Secondary | ICD-10-CM | POA: Insufficient documentation

## 2013-08-13 DIAGNOSIS — I998 Other disorder of circulatory system: Secondary | ICD-10-CM | POA: Insufficient documentation

## 2013-08-13 DIAGNOSIS — E538 Deficiency of other specified B group vitamins: Secondary | ICD-10-CM | POA: Insufficient documentation

## 2013-08-13 DIAGNOSIS — M25511 Pain in right shoulder: Secondary | ICD-10-CM

## 2013-08-13 LAB — CBC WITH DIFFERENTIAL/PLATELET
BASOS ABS: 0 10*3/uL (ref 0.0–0.1)
BASOS PCT: 0 % (ref 0–1)
Eosinophils Absolute: 0.1 10*3/uL (ref 0.0–0.7)
Eosinophils Relative: 2 % (ref 0–5)
HEMATOCRIT: 25.8 % — AB (ref 39.0–52.0)
Hemoglobin: 7.8 g/dL — ABNORMAL LOW (ref 13.0–17.0)
LYMPHS PCT: 8 % — AB (ref 12–46)
Lymphs Abs: 0.3 10*3/uL — ABNORMAL LOW (ref 0.7–4.0)
MCH: 23.6 pg — ABNORMAL LOW (ref 26.0–34.0)
MCHC: 30.2 g/dL (ref 30.0–36.0)
MCV: 78.2 fL (ref 78.0–100.0)
Monocytes Absolute: 0.4 10*3/uL (ref 0.1–1.0)
Monocytes Relative: 11 % (ref 3–12)
NEUTROS ABS: 2.8 10*3/uL (ref 1.7–7.7)
NEUTROS PCT: 79 % — AB (ref 43–77)
Platelets: 52 10*3/uL — ABNORMAL LOW (ref 150–400)
RBC: 3.3 MIL/uL — ABNORMAL LOW (ref 4.22–5.81)
RDW: 16.8 % — ABNORMAL HIGH (ref 11.5–15.5)
WBC: 3.5 10*3/uL — AB (ref 4.0–10.5)

## 2013-08-13 LAB — COMPREHENSIVE METABOLIC PANEL
ALK PHOS: 111 U/L (ref 39–117)
ALT: 14 U/L (ref 0–53)
AST: 28 U/L (ref 0–37)
Albumin: 3.2 g/dL — ABNORMAL LOW (ref 3.5–5.2)
BILIRUBIN TOTAL: 1.4 mg/dL — AB (ref 0.3–1.2)
BUN: 10 mg/dL (ref 6–23)
CHLORIDE: 108 meq/L (ref 96–112)
CO2: 25 meq/L (ref 19–32)
Calcium: 8.8 mg/dL (ref 8.4–10.5)
Creatinine, Ser: 1.28 mg/dL (ref 0.50–1.35)
GFR calc Af Amer: 69 mL/min — ABNORMAL LOW (ref >90)
GFR, EST NON AFRICAN AMERICAN: 59 mL/min — AB (ref >90)
Glucose, Bld: 100 mg/dL — ABNORMAL HIGH (ref 70–99)
POTASSIUM: 3.8 meq/L (ref 3.7–5.3)
Sodium: 143 mEq/L (ref 137–147)
Total Protein: 6.9 g/dL (ref 6.0–8.3)

## 2013-08-13 MED ORDER — HEPARIN SOD (PORK) LOCK FLUSH 100 UNIT/ML IV SOLN
INTRAVENOUS | Status: AC
  Filled 2013-08-13: qty 5

## 2013-08-13 MED ORDER — CYANOCOBALAMIN 1000 MCG/ML IJ SOLN
INTRAMUSCULAR | Status: AC
  Filled 2013-08-13: qty 1

## 2013-08-13 MED ORDER — HEPARIN SOD (PORK) LOCK FLUSH 100 UNIT/ML IV SOLN
500.0000 [IU] | Freq: Once | INTRAVENOUS | Status: AC
  Administered 2013-08-13: 500 [IU] via INTRAVENOUS

## 2013-08-13 MED ORDER — HYDROCODONE-ACETAMINOPHEN 5-325 MG PO TABS: 1.0000 | ORAL_TABLET | Freq: Four times a day (QID) | ORAL | Status: DC | PRN

## 2013-08-13 MED ORDER — CYANOCOBALAMIN 1000 MCG/ML IJ SOLN
1000.0000 ug | Freq: Once | INTRAMUSCULAR | Status: AC
  Administered 2013-08-13: 1000 ug via INTRAMUSCULAR

## 2013-08-13 NOTE — Patient Instructions (Signed)
[name] Discharge Instructions  RECOMMENDATIONS MADE BY THE CONSULTANT AND ANY TEST RESULTS WILL BE SENT TO YOUR REFERRING PHYSICIAN.  We will see you every 4 weeks for labs and port flushes. We will see you in 3 months for follow up. B-12 injections monthly.   Thank you for choosing [facility] to provide your oncology and hematology care.  To afford each patient quality time with our providers, please arrive at least 15 minutes before your scheduled appointment time.  With your help, our goal is to use those 15 minutes to complete the necessary work-up to ensure our physicians have the information they need to help with your evaluation and healthcare recommendations.    Effective January 1st, 2014, we ask that you re-schedule your appointment with our physicians should you arrive 10 or more minutes late for your appointment.  We strive to give you quality time with our providers, and arriving late affects you and other patients whose appointments are after yours.    Again, thank you for choosing [facility].  Our hope is that these requests will decrease the amount of time that you wait before being seen by our physicians.       _____________________________________________________________  Should you have questions after your visit to [facility], please contact our office at [phone] between the hours of 8:30 a.m. and 5:00 p.m.  Voicemails left after 4:30 p.m. will not be returned until the following business day.  For prescription refill requests, have your pharmacy contact our office with your prescription refill request.

## 2013-08-13 NOTE — Progress Notes (Signed)
.  injecDonnie R [name] presents today for injection per MD orders. B12 1000mg  administered SQ in left Upper Arm. Administration without incident. Patient tolerated well.  [name] presented for Portacath access and flush.  Portacath located left chest wall accessed with  H 20 needle. Good blood return present. Portacath flushed with 50ml NS and 500U/66ml Heparin and needle removed intact. Procedure without incident. Patient tolerated procedure well.

## 2013-08-14 ENCOUNTER — Other Ambulatory Visit (HOSPITAL_COMMUNITY): Payer: Self-pay | Admitting: Oncology

## 2013-08-14 DIAGNOSIS — C7800 Secondary malignant neoplasm of unspecified lung: Secondary | ICD-10-CM

## 2013-08-14 DIAGNOSIS — C20 Malignant neoplasm of rectum: Secondary | ICD-10-CM

## 2013-08-14 DIAGNOSIS — D649 Anemia, unspecified: Secondary | ICD-10-CM

## 2013-08-14 LAB — CEA: CEA: 7.9 ng/mL — ABNORMAL HIGH (ref 0.0–5.0)

## 2013-08-15 ENCOUNTER — Encounter (HOSPITAL_BASED_OUTPATIENT_CLINIC_OR_DEPARTMENT_OTHER): Payer: Managed Care, Other (non HMO)

## 2013-08-15 DIAGNOSIS — D510 Vitamin B12 deficiency anemia due to intrinsic factor deficiency: Secondary | ICD-10-CM

## 2013-08-15 DIAGNOSIS — D649 Anemia, unspecified: Secondary | ICD-10-CM

## 2013-08-15 LAB — CBC
HEMATOCRIT: 26.3 % — AB (ref 39.0–52.0)
HEMOGLOBIN: 8.1 g/dL — AB (ref 13.0–17.0)
MCH: 24.3 pg — ABNORMAL LOW (ref 26.0–34.0)
MCHC: 30.8 g/dL (ref 30.0–36.0)
MCV: 78.7 fL (ref 78.0–100.0)
Platelets: 41 10*3/uL — ABNORMAL LOW (ref 150–400)
RBC: 3.34 MIL/uL — ABNORMAL LOW (ref 4.22–5.81)
RDW: 16.8 % — AB (ref 11.5–15.5)
WBC: 3.5 10*3/uL — ABNORMAL LOW (ref 4.0–10.5)

## 2013-08-15 LAB — IRON AND TIBC
Iron: 24 ug/dL — ABNORMAL LOW (ref 42–135)
SATURATION RATIOS: 5 % — AB (ref 20–55)
TIBC: 439 ug/dL — ABNORMAL HIGH (ref 215–435)
UIBC: 415 ug/dL — AB (ref 125–400)

## 2013-08-15 LAB — FOLATE: Folate: >20 ng/mL

## 2013-08-15 LAB — FERRITIN: FERRITIN: 5 ng/mL — AB (ref 22–322)

## 2013-08-15 LAB — VITAMIN B12: Vitamin B-12: 1070 pg/mL — ABNORMAL HIGH (ref 211–911)

## 2013-08-15 NOTE — Progress Notes (Signed)
Labs drawn today for cbc,b12,Iron and IBC,folate,ferr

## 2013-08-16 ENCOUNTER — Other Ambulatory Visit (HOSPITAL_COMMUNITY): Payer: Self-pay | Admitting: Oncology

## 2013-08-16 DIAGNOSIS — D500 Iron deficiency anemia secondary to blood loss (chronic): Secondary | ICD-10-CM

## 2013-08-19 ENCOUNTER — Encounter (HOSPITAL_BASED_OUTPATIENT_CLINIC_OR_DEPARTMENT_OTHER): Payer: Managed Care, Other (non HMO)

## 2013-08-19 VITALS — BP 139/73 | HR 73 | Temp 98.6°F | Resp 18

## 2013-08-19 DIAGNOSIS — D500 Iron deficiency anemia secondary to blood loss (chronic): Secondary | ICD-10-CM

## 2013-08-19 DIAGNOSIS — D510 Vitamin B12 deficiency anemia due to intrinsic factor deficiency: Secondary | ICD-10-CM

## 2013-08-19 MED ORDER — HEPARIN SOD (PORK) LOCK FLUSH 100 UNIT/ML IV SOLN
INTRAVENOUS | Status: AC
  Filled 2013-08-19: qty 5

## 2013-08-19 MED ORDER — HEPARIN SOD (PORK) LOCK FLUSH 100 UNIT/ML IV SOLN
500.0000 [IU] | Freq: Once | INTRAVENOUS | Status: AC
  Administered 2013-08-19: 500 [IU] via INTRAVENOUS

## 2013-08-19 MED ORDER — SODIUM CHLORIDE 0.9 % IJ SOLN
10.0000 mL | INTRAMUSCULAR | Status: DC | PRN
  Administered 2013-08-19: 10 mL via INTRAVENOUS

## 2013-08-19 MED ORDER — SODIUM CHLORIDE 0.9 % IV SOLN
1020.0000 mg | Freq: Once | INTRAVENOUS | Status: AC
  Administered 2013-08-19: 1020 mg via INTRAVENOUS
  Filled 2013-08-19: qty 34

## 2013-08-19 NOTE — Progress Notes (Signed)
[place] located left chest wall accessed with  H 20 needle. Good blood return present.  Flushed with NS 108ml; NS 275ml infusing 25ml/h.  Procedure without incident. Patient tolerated procedure well.

## 2013-09-03 ENCOUNTER — Ambulatory Visit (HOSPITAL_COMMUNITY): Payer: Managed Care, Other (non HMO)

## 2013-09-03 ENCOUNTER — Ambulatory Visit (HOSPITAL_COMMUNITY)
Admission: RE | Admit: 2013-09-03 | Discharge: 2013-09-03 | Disposition: A | Discharge status: Home / Self Care | Payer: Managed Care, Other (non HMO) | Source: Ambulatory Visit | Attending: Oncology | Admitting: Oncology

## 2013-09-03 DIAGNOSIS — R109 Unspecified abdominal pain: Secondary | ICD-10-CM | POA: Insufficient documentation

## 2013-09-03 DIAGNOSIS — C20 Malignant neoplasm of rectum: Secondary | ICD-10-CM | POA: Insufficient documentation

## 2013-09-03 DIAGNOSIS — C7800 Secondary malignant neoplasm of unspecified lung: Secondary | ICD-10-CM

## 2013-09-03 MED ORDER — IOHEXOL 300 MG/ML  SOLN
100.0000 mL | Freq: Once | INTRAMUSCULAR | Status: AC | PRN
  Administered 2013-09-03: 100 mL via INTRAVENOUS

## 2013-09-05 ENCOUNTER — Ambulatory Visit
Admission: RE | Admit: 2013-09-05 | Discharge: 2013-09-05 | Disposition: A | Discharge status: Home / Self Care | Payer: Managed Care, Other (non HMO) | Source: Ambulatory Visit | Attending: Radiation Oncology | Admitting: Radiation Oncology

## 2013-09-05 ENCOUNTER — Encounter: Payer: Self-pay | Admitting: Radiation Oncology

## 2013-09-05 VITALS — BP 164/85 | HR 61 | Resp 16 | Wt 221.9 lb

## 2013-09-05 DIAGNOSIS — C7800 Secondary malignant neoplasm of unspecified lung: Secondary | ICD-10-CM

## 2013-09-05 NOTE — Progress Notes (Signed)
Denies headache, dizziness, nausea or vomiting. Weight stable. Report he received call from [facility] yesterday and understand "they see something at the rectum." He reports they are arranging a colonoscopy and potential surgery. Denies blood in colonoscopy collection bag but, reports he is passing blood and fluid from his rectum. Patient has hx of kidney stones. Patient reports intermittent thoracic back pain and right flank pain.

## 2013-09-05 NOTE — Progress Notes (Signed)
Radiation Oncology         [phone] ________________________________  Name: [name] MRN: [id number]  Date: 09/05/2013  DOB: 1951-05-21  Follow-Up Visit Note  CC: [name], MD  Pieter Partridge, MD  Diagnosis:   63 year old gentleman with metastatic rectal cancer  1.  Post-LAR pelvic radiotherapy from 1/20-2/27/2009 to a total prescription dose of 54 gray at [facility] in [place] 2.  3/10-3/17/2014 SBRT to a 2.3 cm solitary right upper lung nodule received 54 Gy in 3 fractions 3.  2/23-08/02/2013 SBRT to 2 isolated lung metastases   A.  54 Gy in 3 fractions of 18 Gy to his right upper lobe metastasis   B.  54 Gy in 3 fractions of 18 Gy to his left upper lobe metastasis   Interval Since Last Radiation:  4  weeks  Narrative:  The patient returns today for routine follow-up.  Denies headache, dizziness, nausea or vomiting. Weight stable. Report he received call from [facility] yesterday and understand "they see something at the rectum." He reports they are arranging a colonoscopy and potential surgery. Denies blood in colonoscopy collection bag but, reports he is passing blood and fluid from his rectum. Patient has hx of kidney stones. Patient reports intermittent thoracic back pain and right flank pain.                              ALLERGIES:  is allergic to daypro.  Meds: Current Outpatient Prescriptions  Medication Sig Dispense Refill  . HYDROcodone-acetaminophen (NORCO/VICODIN) 5-325 MG per tablet Take 1-2 tablets by mouth every 6 (six) hours as needed.  60 tablet  0  . cyanocobalamin (,VITAMIN B-12,) 1000 MCG/ML injection Inject 1,000 mcg into the muscle every 30 (thirty) days.       . cyclobenzaprine (FLEXERIL) 10 MG tablet Take 1 tablet (10 mg total) by mouth 3 (three) times daily as needed for muscle spasms.  30 tablet  0  . lidocaine-prilocaine (EMLA) cream Apply 1 application topically as needed. Apply quarter sized amount over port-a-cath site one hour  prior to chemotherapy appointment.      . temazepam (RESTORIL) 15 MG capsule Take 1 capsule (15 mg total) by mouth at bedtime as needed for sleep.  30 capsule  1   No current facility-administered medications for this encounter.   Facility-Administered Medications Ordered in Other Encounters  Medication Dose Route Frequency Provider Last Rate Last Dose  . 0.9 %  sodium chloride infusion   Intravenous Continuous Pieter Partridge, MD 20 mL/hr at 07/17/12 1113    . heparin lock flush 100 unit/mL  500 Units Intravenous Once Pieter Partridge, MD      . sodium chloride 0.9 % injection 10 mL  10 mL Intravenous PRN Pieter Partridge, MD   10 mL at 07/17/12 1113    Physical Findings: The patient is in no acute distress. Patient is alert and oriented.  weight is 221 lb 14.4 oz (100.653 kg). .  No significant changes.  Lab Findings: Lab Results  Component Value Date   WBC 3.5* 08/15/2013   HGB 8.1* 08/15/2013   HCT 26.3* 08/15/2013   MCV 78.7 08/15/2013   PLT 41* 08/15/2013    @LASTCHEM @  Radiographic Findings: Ct Chest W Contrast  09/03/2013   CLINICAL DATA:  Metastatic rectal carcinoma. Left flank pain. Restaging.  EXAM: CT CHEST, ABDOMEN, AND PELVIS WITH CONTRAST  TECHNIQUE: Multidetector CT imaging of  the chest, abdomen and pelvis was performed following the standard protocol during bolus administration of intravenous contrast.  CONTRAST:  12mL OMNIPAQUE IOHEXOL 300 MG/ML  SOLN  COMPARISON:  US RENAL dated 05/01/2013; CT ABD - PELV W/ CM dated 04/18/2013; CT ABD - PELV W/ CM dated 01/14/2013  FINDINGS: CT CHEST FINDINGS  Left subclavian Port-A-Cath tip appears unchanged at the SVC right atrial junction. There are no enlarged mediastinal, hilar or axillary lymph nodes. Sebaceous cyst in the upper right back is unchanged.  There is stable mild atherosclerosis of the coronary arteries, aorta and great vessels. There is no significant pleural or pericardial effusion. The heart size is normal. Bilateral  gynecomastia is noted.  There is stable scarring in the right upper lobe. The previously demonstrated 5 mm nodule medially in the right upper lobe is smaller and less well-defined on the axial images. No new or enlarging pulmonary nodules identified.  There are no worrisome osseous findings.  CT ABDOMEN AND PELVIS FINDINGS  Mild contour irregularity of the liver appears stable. There are no focal liver lesions. There is stable splenomegaly with a stable possible infarct posteriorly in the spleen on image 55. The gallbladder is surgically absent. There is no biliary dilatation. The pancreas appears normal.  There is no adrenal mass. Multiple bilateral renal cysts are stable. There are bilateral renal calculi with a 1.7 cm calculus in the left renal pelvis. Dilatation of the left renal pelvis appears progressive and there is delayed contrast excretion from the left kidney. Both kidneys demonstrate mild cortical thinning.  The left ureter appears dilated into the pelvis where there is increasing oval soft tissue adjacent to the rectal suture line. This measures 3.9 x 3.0 cm on image 96 and is concerning for local recurrence with resulting ureteral obstruction. Small perirectal lymph nodes and perirectal soft tissue stranding are stable. The bladder, prostate gland and seminal vesicles appear stable. There is a limited fat plane between the suspected local recurrence and the bladder, although no gross bladder invasion.  No ascites or other abdominal lymphadenopathy is seen. The stomach and small bowel appear stable with parastomal herniation of small bowel surrounding the right lower quadrant ileostomy  Chronic thoracolumbar compression deformities appear stable. There are no worrisome osseous findings.  IMPRESSION: 1. Increased soft tissue density adjacent to the rectal suture line worrisome for local recurrence of rectal adenocarcinoma. This is supported by apparent partial obstruction of the distal left ureter. 2. No  other signs of metastatic disease within the abdomen or pelvis. 3. Previously demonstrated right upper lobe pulmonary nodule is less well-defined and smaller, consistent with a treated lesion. 4. Stable bilateral renal calculi. Calculus in the left renal pelvis does not appear to be obstructing the UPJ with the patient supine. 5. Stable parastomal herniation of small bowel surrounding the ileostomy.   Electronically Signed   By: Camie Patience M.D.   On: 09/03/2013 17:41   Ct Abdomen Pelvis W Contrast  09/03/2013   CLINICAL DATA:  Metastatic rectal carcinoma. Left flank pain. Restaging.  EXAM: CT CHEST, ABDOMEN, AND PELVIS WITH CONTRAST  TECHNIQUE: Multidetector CT imaging of the chest, abdomen and pelvis was performed following the standard protocol during bolus administration of intravenous contrast.  CONTRAST:  116mL OMNIPAQUE IOHEXOL 300 MG/ML  SOLN  COMPARISON:  US RENAL dated 05/01/2013; CT ABD - PELV W/ CM dated 04/18/2013; CT ABD - PELV W/ CM dated 01/14/2013  FINDINGS: CT CHEST FINDINGS  Left subclavian Port-A-Cath tip appears unchanged at the [facility]  right atrial junction. There are no enlarged mediastinal, hilar or axillary lymph nodes. Sebaceous cyst in the upper right back is unchanged.  There is stable mild atherosclerosis of the coronary arteries, aorta and great vessels. There is no significant pleural or pericardial effusion. The heart size is normal. Bilateral gynecomastia is noted.  There is stable scarring in the right upper lobe. The previously demonstrated 5 mm nodule medially in the right upper lobe is smaller and less well-defined on the axial images. No new or enlarging pulmonary nodules identified.  There are no worrisome osseous findings.  CT ABDOMEN AND PELVIS FINDINGS  Mild contour irregularity of the liver appears stable. There are no focal liver lesions. There is stable splenomegaly with a stable possible infarct posteriorly in the spleen on image 55. The gallbladder is surgically absent.  There is no biliary dilatation. The pancreas appears normal.  There is no adrenal mass. Multiple bilateral renal cysts are stable. There are bilateral renal calculi with a 1.7 cm calculus in the left renal pelvis. Dilatation of the left renal pelvis appears progressive and there is delayed contrast excretion from the left kidney. Both kidneys demonstrate mild cortical thinning.  The left ureter appears dilated into the pelvis where there is increasing oval soft tissue adjacent to the rectal suture line. This measures 3.9 x 3.0 cm on image 96 and is concerning for local recurrence with resulting ureteral obstruction. Small perirectal lymph nodes and perirectal soft tissue stranding are stable. The bladder, prostate gland and seminal vesicles appear stable. There is a limited fat plane between the suspected local recurrence and the bladder, although no gross bladder invasion.  No ascites or other abdominal lymphadenopathy is seen. The stomach and small bowel appear stable with parastomal herniation of small bowel surrounding the right lower quadrant ileostomy  Chronic thoracolumbar compression deformities appear stable. There are no worrisome osseous findings.  IMPRESSION: 1. Increased soft tissue density adjacent to the rectal suture line worrisome for local recurrence of rectal adenocarcinoma. This is supported by apparent partial obstruction of the distal left ureter. 2. No other signs of metastatic disease within the abdomen or pelvis. 3. Previously demonstrated right upper lobe pulmonary nodule is less well-defined and smaller, consistent with a treated lesion. 4. Stable bilateral renal calculi. Calculus in the left renal pelvis does not appear to be obstructing the UPJ with the patient supine. 5. Stable parastomal herniation of small bowel surrounding the ileostomy.   Electronically Signed   By: Camie Patience M.D.   On: 09/03/2013 17:41   Impression:  The patient appears to have control of history of lung  nodules at present.  His recent rectal bleeding and CT findings showing soft tissue mass at the anastomosis site are potentially worrisome for local recurrence.  Plan:  Tentatively planned for routine followup in 6 months. I concur that the patient requires colonoscopy to further characterize the source of his rectal bleeding. If locally recurrent disease is discovered and PET CT shows no significant distant disease burden, he may benefit from localized reirradiation of the pelvic mass for hemostasis and local control.  _____________________________________  Sheral Apley [name], M.D.

## 2013-09-10 ENCOUNTER — Encounter (HOSPITAL_COMMUNITY): Payer: Managed Care, Other (non HMO) | Attending: Oncology

## 2013-09-10 ENCOUNTER — Encounter (HOSPITAL_COMMUNITY): Payer: Managed Care, Other (non HMO)

## 2013-09-10 ENCOUNTER — Other Ambulatory Visit (HOSPITAL_COMMUNITY): Payer: Managed Care, Other (non HMO)

## 2013-09-10 DIAGNOSIS — R58 Hemorrhage, not elsewhere classified: Secondary | ICD-10-CM

## 2013-09-10 DIAGNOSIS — M25511 Pain in right shoulder: Secondary | ICD-10-CM

## 2013-09-10 DIAGNOSIS — I998 Other disorder of circulatory system: Secondary | ICD-10-CM | POA: Insufficient documentation

## 2013-09-10 DIAGNOSIS — D510 Vitamin B12 deficiency anemia due to intrinsic factor deficiency: Secondary | ICD-10-CM

## 2013-09-10 DIAGNOSIS — E538 Deficiency of other specified B group vitamins: Secondary | ICD-10-CM | POA: Insufficient documentation

## 2013-09-10 DIAGNOSIS — C20 Malignant neoplasm of rectum: Secondary | ICD-10-CM | POA: Insufficient documentation

## 2013-09-10 DIAGNOSIS — M25519 Pain in unspecified shoulder: Secondary | ICD-10-CM | POA: Insufficient documentation

## 2013-09-10 LAB — CBC WITH DIFFERENTIAL/PLATELET
Basophils Absolute: 0 10*3/uL (ref 0.0–0.1)
Basophils Relative: 0 % (ref 0–1)
EOS PCT: 2 % (ref 0–5)
Eosinophils Absolute: 0.1 10*3/uL (ref 0.0–0.7)
HCT: 29.3 % — ABNORMAL LOW (ref 39.0–52.0)
Hemoglobin: 9.3 g/dL — ABNORMAL LOW (ref 13.0–17.0)
LYMPHS PCT: 8 % — AB (ref 12–46)
Lymphs Abs: 0.3 10*3/uL — ABNORMAL LOW (ref 0.7–4.0)
MCH: 27.4 pg (ref 26.0–34.0)
MCHC: 31.7 g/dL (ref 30.0–36.0)
MCV: 86.4 fL (ref 78.0–100.0)
MONOS PCT: 10 % (ref 3–12)
Monocytes Absolute: 0.4 10*3/uL (ref 0.1–1.0)
NEUTROS PCT: 80 % — AB (ref 43–77)
Neutro Abs: 3 10*3/uL (ref 1.7–7.7)
Platelets: 49 10*3/uL — ABNORMAL LOW (ref 150–400)
RBC: 3.39 MIL/uL — AB (ref 4.22–5.81)
RDW: 25.4 % — AB (ref 11.5–15.5)
WBC: 3.8 10*3/uL — AB (ref 4.0–10.5)

## 2013-09-10 LAB — APTT: aPTT: 38 seconds — ABNORMAL HIGH (ref 24–37)

## 2013-09-10 LAB — COMPREHENSIVE METABOLIC PANEL
ALBUMIN: 3 g/dL — AB (ref 3.5–5.2)
ALK PHOS: 118 U/L — AB (ref 39–117)
ALT: 16 U/L (ref 0–53)
AST: 32 U/L (ref 0–37)
BUN: 12 mg/dL (ref 6–23)
CALCIUM: 9 mg/dL (ref 8.4–10.5)
CO2: 25 mEq/L (ref 19–32)
Chloride: 110 mEq/L (ref 96–112)
Creatinine, Ser: 1.44 mg/dL — ABNORMAL HIGH (ref 0.50–1.35)
GFR calc non Af Amer: 51 mL/min — ABNORMAL LOW (ref >90)
GFR, EST AFRICAN AMERICAN: 60 mL/min — AB (ref >90)
GLUCOSE: 85 mg/dL (ref 70–99)
POTASSIUM: 3.8 meq/L (ref 3.7–5.3)
SODIUM: 144 meq/L (ref 137–147)
Total Bilirubin: 0.7 mg/dL (ref 0.3–1.2)
Total Protein: 6.6 g/dL (ref 6.0–8.3)

## 2013-09-10 LAB — PROTIME-INR
INR: 1.16 (ref 0.00–1.49)
PROTHROMBIN TIME: 14.6 s (ref 11.6–15.2)

## 2013-09-10 MED ORDER — HEPARIN SOD (PORK) LOCK FLUSH 100 UNIT/ML IV SOLN
500.0000 [IU] | Freq: Once | INTRAVENOUS | Status: AC
  Administered 2013-09-10: 500 [IU] via INTRAVENOUS
  Filled 2013-09-10: qty 5

## 2013-09-10 MED ORDER — CYANOCOBALAMIN 1000 MCG/ML IJ SOLN
1000.0000 ug | Freq: Once | INTRAMUSCULAR | Status: AC
  Administered 2013-09-10: 1000 ug via INTRAMUSCULAR
  Filled 2013-09-10: qty 1

## 2013-09-10 MED ORDER — HYDROCODONE-ACETAMINOPHEN 5-325 MG PO TABS: 1.0000 | ORAL_TABLET | Freq: Four times a day (QID) | ORAL | Status: DC | PRN

## 2013-09-10 MED ORDER — SODIUM CHLORIDE 0.9 % IJ SOLN
10.0000 mL | INTRAMUSCULAR | Status: DC | PRN
  Administered 2013-09-10: 10 mL via INTRAVENOUS

## 2013-09-10 MED ORDER — HEPARIN SOD (PORK) LOCK FLUSH 100 UNIT/ML IV SOLN
INTRAVENOUS | Status: AC
  Filled 2013-09-10: qty 5

## 2013-09-10 NOTE — Progress Notes (Signed)
See port appt notes

## 2013-09-10 NOTE — Progress Notes (Signed)
[name] presents today for injection per MD orders. B12 1000 mcg administered IM in left Upper Arm. Administration without incident. Patient tolerated well. [name] presented for Portacath access and flush. Portacath located lt chest wall accessed with  H 20 needle. Good blood return present. Portacath flushed with 81ml NS and 500U/32ml Heparin and needle removed intact. Procedure without incident. Patient tolerated procedure well.

## 2013-09-11 LAB — CEA: CEA: 8.2 ng/mL — ABNORMAL HIGH (ref 0.0–5.0)

## 2013-09-16 ENCOUNTER — Encounter (HOSPITAL_COMMUNITY): Payer: Self-pay

## 2013-09-20 ENCOUNTER — Other Ambulatory Visit: Payer: Self-pay | Admitting: Gastroenterology

## 2013-09-20 ENCOUNTER — Encounter (HOSPITAL_COMMUNITY)
Admission: RE | Disposition: A | Discharge status: Home / Self Care | Payer: Self-pay | Source: Ambulatory Visit | Attending: Internal Medicine

## 2013-09-20 ENCOUNTER — Other Ambulatory Visit (HOSPITAL_COMMUNITY): Payer: Managed Care, Other (non HMO) | Admitting: Hematology and Oncology

## 2013-09-20 ENCOUNTER — Encounter (HOSPITAL_COMMUNITY): Payer: Self-pay | Admitting: *Deleted

## 2013-09-20 ENCOUNTER — Telehealth: Payer: Self-pay

## 2013-09-20 ENCOUNTER — Inpatient Hospital Stay (HOSPITAL_COMMUNITY)
Admission: RE | Admit: 2013-09-20 | Discharge: 2013-09-28 | DRG: 374 | Disposition: A | Discharge status: Home / Self Care | Payer: Managed Care, Other (non HMO) | Source: Ambulatory Visit | Attending: Internal Medicine | Admitting: Internal Medicine

## 2013-09-20 DIAGNOSIS — K921 Melena: Secondary | ICD-10-CM | POA: Diagnosis present

## 2013-09-20 DIAGNOSIS — Z8049 Family history of malignant neoplasm of other genital organs: Secondary | ICD-10-CM

## 2013-09-20 DIAGNOSIS — N210 Calculus in bladder: Secondary | ICD-10-CM | POA: Diagnosis present

## 2013-09-20 DIAGNOSIS — N179 Acute kidney failure, unspecified: Secondary | ICD-10-CM

## 2013-09-20 DIAGNOSIS — Z9221 Personal history of antineoplastic chemotherapy: Secondary | ICD-10-CM

## 2013-09-20 DIAGNOSIS — Z87891 Personal history of nicotine dependence: Secondary | ICD-10-CM

## 2013-09-20 DIAGNOSIS — K625 Hemorrhage of anus and rectum: Secondary | ICD-10-CM

## 2013-09-20 DIAGNOSIS — Z85038 Personal history of other malignant neoplasm of large intestine: Secondary | ICD-10-CM

## 2013-09-20 DIAGNOSIS — M25511 Pain in right shoulder: Secondary | ICD-10-CM

## 2013-09-20 DIAGNOSIS — D696 Thrombocytopenia, unspecified: Secondary | ICD-10-CM | POA: Diagnosis present

## 2013-09-20 DIAGNOSIS — Z8249 Family history of ischemic heart disease and other diseases of the circulatory system: Secondary | ICD-10-CM

## 2013-09-20 DIAGNOSIS — E119 Type 2 diabetes mellitus without complications: Secondary | ICD-10-CM

## 2013-09-20 DIAGNOSIS — Z9049 Acquired absence of other specified parts of digestive tract: Secondary | ICD-10-CM

## 2013-09-20 DIAGNOSIS — D62 Acute posthemorrhagic anemia: Secondary | ICD-10-CM

## 2013-09-20 DIAGNOSIS — Z800 Family history of malignant neoplasm of digestive organs: Secondary | ICD-10-CM

## 2013-09-20 DIAGNOSIS — D539 Nutritional anemia, unspecified: Secondary | ICD-10-CM

## 2013-09-20 DIAGNOSIS — C7800 Secondary malignant neoplasm of unspecified lung: Secondary | ICD-10-CM

## 2013-09-20 DIAGNOSIS — N200 Calculus of kidney: Secondary | ICD-10-CM | POA: Diagnosis present

## 2013-09-20 DIAGNOSIS — E538 Deficiency of other specified B group vitamins: Secondary | ICD-10-CM

## 2013-09-20 DIAGNOSIS — N1330 Unspecified hydronephrosis: Secondary | ICD-10-CM

## 2013-09-20 DIAGNOSIS — E860 Dehydration: Secondary | ICD-10-CM | POA: Diagnosis present

## 2013-09-20 DIAGNOSIS — K922 Gastrointestinal hemorrhage, unspecified: Secondary | ICD-10-CM

## 2013-09-20 DIAGNOSIS — N183 Chronic kidney disease, stage 3 unspecified: Secondary | ICD-10-CM | POA: Diagnosis present

## 2013-09-20 DIAGNOSIS — Z85118 Personal history of other malignant neoplasm of bronchus and lung: Secondary | ICD-10-CM

## 2013-09-20 DIAGNOSIS — D61818 Other pancytopenia: Secondary | ICD-10-CM | POA: Diagnosis present

## 2013-09-20 DIAGNOSIS — E7800 Pure hypercholesterolemia, unspecified: Secondary | ICD-10-CM

## 2013-09-20 DIAGNOSIS — K6289 Other specified diseases of anus and rectum: Secondary | ICD-10-CM

## 2013-09-20 DIAGNOSIS — I2510 Atherosclerotic heart disease of native coronary artery without angina pectoris: Secondary | ICD-10-CM

## 2013-09-20 DIAGNOSIS — D61810 Antineoplastic chemotherapy induced pancytopenia: Secondary | ICD-10-CM | POA: Diagnosis present

## 2013-09-20 DIAGNOSIS — C20 Malignant neoplasm of rectum: Principal | ICD-10-CM

## 2013-09-20 DIAGNOSIS — T451X5A Adverse effect of antineoplastic and immunosuppressive drugs, initial encounter: Secondary | ICD-10-CM | POA: Diagnosis present

## 2013-09-20 DIAGNOSIS — R310 Gross hematuria: Secondary | ICD-10-CM

## 2013-09-20 DIAGNOSIS — G4730 Sleep apnea, unspecified: Secondary | ICD-10-CM

## 2013-09-20 DIAGNOSIS — E876 Hypokalemia: Secondary | ICD-10-CM | POA: Diagnosis not present

## 2013-09-20 DIAGNOSIS — C787 Secondary malignant neoplasm of liver and intrahepatic bile duct: Secondary | ICD-10-CM | POA: Diagnosis present

## 2013-09-20 DIAGNOSIS — R339 Retention of urine, unspecified: Secondary | ICD-10-CM

## 2013-09-20 DIAGNOSIS — Z923 Personal history of irradiation: Secondary | ICD-10-CM

## 2013-09-20 DIAGNOSIS — Z95828 Presence of other vascular implants and grafts: Secondary | ICD-10-CM

## 2013-09-20 HISTORY — PX: COLONOSCOPY: SHX5424

## 2013-09-20 HISTORY — DX: Acute posthemorrhagic anemia: D62

## 2013-09-20 HISTORY — DX: Acute myocardial infarction, unspecified: I21.9

## 2013-09-20 HISTORY — DX: Gross hematuria: R31.0

## 2013-09-20 HISTORY — DX: Hemorrhage of anus and rectum: K62.5

## 2013-09-20 HISTORY — DX: Malignant neoplasm of rectum: C20

## 2013-09-20 HISTORY — DX: Unspecified hydronephrosis: N13.30

## 2013-09-20 LAB — CBC WITH DIFFERENTIAL/PLATELET
Basophils Absolute: 0 10*3/uL (ref 0.0–0.1)
Basophils Relative: 0 % (ref 0–1)
EOS PCT: 2 % (ref 0–5)
Eosinophils Absolute: 0.1 10*3/uL (ref 0.0–0.7)
HCT: 28.8 % — ABNORMAL LOW (ref 39.0–52.0)
Hemoglobin: 9.3 g/dL — ABNORMAL LOW (ref 13.0–17.0)
Lymphocytes Relative: 11 % — ABNORMAL LOW (ref 12–46)
Lymphs Abs: 0.4 10*3/uL — ABNORMAL LOW (ref 0.7–4.0)
MCH: 27.7 pg (ref 26.0–34.0)
MCHC: 32.3 g/dL (ref 30.0–36.0)
MCV: 85.7 fL (ref 78.0–100.0)
MONO ABS: 0.3 10*3/uL (ref 0.1–1.0)
MONOS PCT: 8 % (ref 3–12)
NEUTROS PCT: 79 % — AB (ref 43–77)
Neutro Abs: 3.1 10*3/uL (ref 1.7–7.7)
PLATELETS: 41 10*3/uL — AB (ref 150–400)
RBC: 3.36 MIL/uL — AB (ref 4.22–5.81)
RDW: 23.2 % — AB (ref 11.5–15.5)
SMEAR REVIEW: DECREASED
WBC: 3.9 10*3/uL — AB (ref 4.0–10.5)

## 2013-09-20 LAB — COMPREHENSIVE METABOLIC PANEL WITH GFR
ALT: 15 U/L (ref 0–53)
AST: 30 U/L (ref 0–37)
Albumin: 3.2 g/dL — ABNORMAL LOW (ref 3.5–5.2)
Alkaline Phosphatase: 127 U/L — ABNORMAL HIGH (ref 39–117)
BUN: 14 mg/dL (ref 6–23)
CO2: 23 meq/L (ref 19–32)
Calcium: 8.9 mg/dL (ref 8.4–10.5)
Chloride: 108 meq/L (ref 96–112)
Creatinine, Ser: 2.17 mg/dL — ABNORMAL HIGH (ref 0.50–1.35)
GFR calc Af Amer: 36 mL/min — ABNORMAL LOW
GFR calc non Af Amer: 31 mL/min — ABNORMAL LOW
Glucose, Bld: 97 mg/dL (ref 70–99)
Potassium: 3.5 meq/L — ABNORMAL LOW (ref 3.7–5.3)
Sodium: 143 meq/L (ref 137–147)
Total Bilirubin: 1.2 mg/dL (ref 0.3–1.2)
Total Protein: 6.3 g/dL (ref 6.0–8.3)

## 2013-09-20 LAB — PROTIME-INR
INR: 1.15 (ref 0.00–1.49)
Prothrombin Time: 14.5 s (ref 11.6–15.2)

## 2013-09-20 LAB — GLUCOSE, CAPILLARY: Glucose-Capillary: 89 mg/dL (ref 70–99)

## 2013-09-20 SURGERY — COLONOSCOPY
Anesthesia: Moderate Sedation

## 2013-09-20 MED ORDER — SODIUM CHLORIDE 0.9 % IJ SOLN
INTRAMUSCULAR | Status: AC
  Filled 2013-09-20: qty 10

## 2013-09-20 MED ORDER — ONDANSETRON HCL 4 MG/2ML IJ SOLN: 4.0000 mg | Freq: Four times a day (QID) | INTRAMUSCULAR | Status: DC | PRN

## 2013-09-20 MED ORDER — INSULIN ASPART 100 UNIT/ML ~~LOC~~ SOLN
0.0000 [IU] | Freq: Every day | SUBCUTANEOUS | Status: DC
  Administered 2013-09-27: 1 [IU] via SUBCUTANEOUS

## 2013-09-20 MED ORDER — STERILE WATER FOR IRRIGATION IR SOLN
Status: DC | PRN
  Administered 2013-09-20: 14:00:00

## 2013-09-20 MED ORDER — SODIUM CHLORIDE 0.9 % IV SOLN
INTRAVENOUS | Status: DC
  Administered 2013-09-20: 13:00:00 via INTRAVENOUS

## 2013-09-20 MED ORDER — MEPERIDINE HCL 100 MG/ML IJ SOLN
INTRAMUSCULAR | Status: DC | PRN
  Administered 2013-09-20: 25 mg via INTRAVENOUS
  Administered 2013-09-20: 50 mg via INTRAVENOUS

## 2013-09-20 MED ORDER — MEPERIDINE HCL 100 MG/ML IJ SOLN
INTRAMUSCULAR | Status: AC
  Filled 2013-09-20: qty 2

## 2013-09-20 MED ORDER — ONDANSETRON HCL 4 MG PO TABS: 4.0000 mg | ORAL_TABLET | Freq: Four times a day (QID) | ORAL | Status: DC | PRN

## 2013-09-20 MED ORDER — INSULIN ASPART 100 UNIT/ML ~~LOC~~ SOLN
0.0000 [IU] | Freq: Three times a day (TID) | SUBCUTANEOUS | Status: DC
  Administered 2013-09-26 (×2): 1 [IU] via SUBCUTANEOUS

## 2013-09-20 MED ORDER — PROMETHAZINE HCL 25 MG/ML IJ SOLN
12.5000 mg | Freq: Once | INTRAMUSCULAR | Status: AC
  Administered 2013-09-20: 12.5 mg via INTRAVENOUS
  Filled 2013-09-20: qty 1

## 2013-09-20 MED ORDER — FLEET ENEMA 7-19 GM/118ML RE ENEM
1.0000 | ENEMA | Freq: Once | RECTAL | Status: AC
  Administered 2013-09-20: 1 via RECTAL
  Filled 2013-09-20: qty 1

## 2013-09-20 MED ORDER — MIDAZOLAM HCL 5 MG/5ML IJ SOLN
INTRAMUSCULAR | Status: AC
  Filled 2013-09-20: qty 10

## 2013-09-20 MED ORDER — SODIUM CHLORIDE 0.9 % IV SOLN
INTRAVENOUS | Status: DC
  Administered 2013-09-20: 100 mL via INTRAVENOUS
  Administered 2013-09-21 – 2013-09-23 (×3): via INTRAVENOUS

## 2013-09-20 MED ORDER — MIDAZOLAM HCL 5 MG/5ML IJ SOLN
INTRAMUSCULAR | Status: DC | PRN
  Administered 2013-09-20: 2 mg via INTRAVENOUS
  Administered 2013-09-20: 1 mg via INTRAVENOUS
  Administered 2013-09-20: 2 mg via INTRAVENOUS

## 2013-09-20 NOTE — H&P (Addendum)
Primary Care Physician:  [name], MD Primary Gastroenterologist:  Dr. [name]  Pre-Procedure History & Physical: HPI:  [name] is a 63 y.o. male here for BRBPR.  Past Medical History  Diagnosis Date  . Pancytopenia, acquired 12/04/2010  . B12 deficiency 12/07/2010  . Kidney stones   . CAD (coronary artery disease)   . Anemia   . History of radiation therapy 06/19/07 -07/27/07    whole pelvis  . Hx of radiation therapy 08/06/12,08/09/12,& 08/13/12    rul 54GY/45fx  . Cancer     Colon ca dx 2008 surg/rad/chemo  . Lung cancer   . FH: chemotherapy   . Myocardial infarction     Past Surgical History  Procedure Laterality Date  . Ileostomy  12/07/2010    Procedure: ILEOSTOMY;  Surgeon: Donato Heinz;  Location: [facility] ORS;  Service: General;  Laterality: N/A;  Diverting Ileostomy Lysis of adhesions Exploratory Laparotomy  . Lung biopsy  10/27/10    rt lobe- adenocarcinoma  . Cholecystectomy    . Cataract extraction w/ intraocular lens implant  2007    bil  . Lithotripsy  2002  . Coronary angioplasty with stent placement  2003    stenting x 2  . Colon resection  2008    x2  . Esophagogastroduodenoscopy N/A 05/02/2013    Procedure: ESOPHAGOGASTRODUODENOSCOPY (EGD);  Surgeon: Daneil Dolin, MD;  Location: [facility] ENDO SUITE;  Service: Endoscopy;  Laterality: N/A;  . Givens capsule study N/A 05/02/2013    Procedure: GIVENS CAPSULE STUDY;  Surgeon: Daneil Dolin, MD;  Location: [facility] ENDO SUITE;  Service: Endoscopy;  Laterality: N/A;    Prior to Admission medications   Medication Sig Start Date End Date Taking? Authorizing Provider  acetaminophen (TYLENOL) 325 MG tablet Take 325 mg by mouth every 4 (four) hours as needed.   Yes Historical Provider, MD  cyanocobalamin (,VITAMIN B-12,) 1000 MCG/ML injection Inject 1,000 mcg into the muscle every 30 (thirty) days.    Yes Historical Provider, MD  HYDROcodone-acetaminophen (NORCO/VICODIN) 5-325 MG per tablet Take 1-2 tablets by mouth  every 6 (six) hours as needed. 09/10/13  Yes Baird Cancer, PA-C  lidocaine-prilocaine (EMLA) cream Apply 1 application topically as needed. Apply quarter sized amount over port-a-cath site one hour prior to chemotherapy appointment. 03/30/12  Yes Pieter Partridge, MD  cyclobenzaprine (FLEXERIL) 10 MG tablet Take 1 tablet (10 mg total) by mouth 3 (three) times daily as needed for muscle spasms. 04/22/13   Baird Cancer, PA-C  temazepam (RESTORIL) 15 MG capsule Take 1 capsule (15 mg total) by mouth at bedtime as needed for sleep. 04/22/13   Baird Cancer, PA-C    Allergies as of 09/20/2013 - Review Complete 09/20/2013  Allergen Reaction Noted  . Daypro [oxaprozin] Nausea And Vomiting 07/07/2010    Family History  Problem Relation Age of Onset  . Cervical cancer Mother   . Rectal cancer Father 40  . Uterine cancer Sister     History   Social History  . Marital Status: Married    Spouse Name: N/A    Number of Children: 4  . Years of Education: N/A   Occupational History  . Not on file.   Social History Main Topics  . Smoking status: Former Smoker -- 2.00 packs/day for 30 years    Types: Cigarettes, Cigars  . Smokeless tobacco: Former Systems developer    Types: Chew  . Alcohol Use: Yes     Comment: occasional beer  .  Drug Use: No  . Sexual Activity: Not on file   Other Topics Concern  . Not on file   Social History Narrative  . No narrative on file    Review of Systems: See HPI, otherwise negative ROS   Physical Exam: BP 166/83  Pulse 70  Temp(Src) 98.3 F (36.8 C) (Oral)  Resp 15  Ht 5\' 10"  (1.778 m)  Wt 225 lb (102.059 kg)  BMI 32.28 kg/m2  SpO2 96% General:   Alert,  pleasant and cooperative in NAD Head:  Normocephalic and atraumatic. Neck:  Supple; Lungs:  Clear throughout to auscultation.    Heart:  Regular rate and rhythm. Abdomen:  Soft, nontender and nondistended. Normal bowel sounds, without guarding, and without rebound.  ILEOSTOMY IN  RLQ. Neurologic:  Alert and  oriented x4;  grossly normal neurologically.  Impression/Plan:    BRBPR  PLAN: Flex sig TODAY

## 2013-09-20 NOTE — Telephone Encounter (Addendum)
PT JUST CALLED. JUST HAD LARGE BLOODY MOVEMENT. ASKED PT TO COME TO ENDOSCOPY FOR TCS TODAY.  PT NEEDS 2 TAP WATER ENEMAS IN ENDOSCOPY & PHENERGAN 12.5 MG IV.

## 2013-09-20 NOTE — Op Note (Signed)
[name], [id number]   FLEX SIGMOIDOSCOPY PROCEDURE REPORT  PATIENT: [name], [name]  MR#: [id number] BIRTHDATE: 02/04/1951 , 62  yrs. old GENDER: Male ENDOSCOPIST: Barney Drain, MD REFERRED CB:[id number] [name] [place], MD [name], M.D. , [name], PA-c PROCEDURE DATE:  09/20/2013 PROCEDURE:   Sigmoidoscopy with biopsy  AND CONTROL OF BLEEDING INDICATIONS:rectal bleeding.  PMHx: STAGE IV COLORECTAL CA. PLT CT 41K MEDICATIONS: Promethazine (Phenergan) 12.5mg  IV, Demerol 75 mg IV, and Versed 5 mg IV  DESCRIPTION OF PROCEDURE:    Physical exam was performed.  Informed consent was obtained from the patient after explaining the benefits, risks, and alternatives to procedure.  The patient was connected to monitor and placed in left lateral position. Continuous oxygen was provided by nasal cannula and IV medicine administered through an indwelling cannula.  After administration of sedation and rectal exam WHICH REVELAED A PALPABLE RECTAL MASS. The patients rectum was intubated and the [id number] ([id number]) colonoscope was advanced under direct visualization to the [place]  The scope was removed slowly by carefully examining the color, texture, anatomy, and integrity mucosa on the way out.  The patient was recovered in endoscopy and discharged home in satisfactory condition.    COLON FINDINGS: A near circumferential fungating mass with friable surfaces was found in the rectum.  ACTIVELY OOZING.  CLOTS SEEN IN LUMEN AND ASPIRATED.  Multiple biopsies were performed using cold forceps.  Thermal therapy(BICAP 7 Fr 25W) was used to ATTEMPT TO control bleeding.    HEMOSTASIS NOT ACHEIVED.  PREP QUALITY: adequate]     COMPLICATIONS: None  ENDOSCOPIC IMPRESSION: RECTAL BLEEDING DUE TO Near circumferential mass in the rectum  RECOMMENDATIONS: AWAIT BIOPSY DISCUSSED WITH Mr.  [name].  WILL REVIEW RECORDS TO ASSESS IF PT IS A CANDIDATE  FOR RADIATION. AWAITNG CALL BACK FROM DR.  Pascal [name]: [id number] ADMIT TO MEDICINE/TELEMETRY 1u PHERESED PLTS.  TRANSFUSE pRBCs AS NEEDED. CONSIDER A GENERAL SURGERY REFERRAL IF PT CONTINUES WITH RECTAL BLEEDING AND RAD ONC OR IR INTERVENTION NOT POSSIBLE.       _______________________________ [name] MaisBarney Drain, MD 09/20/2013 2:37 PM

## 2013-09-20 NOTE — Progress Notes (Signed)
09/20/13 1859 Checked with lab staff regarding status of type and screen ordered. Stated someone is on the way to draw blood for type and screen. Will notify night shift RN to monitor. Donavan Foil, RN

## 2013-09-20 NOTE — Telephone Encounter (Signed)
PASSING WHOLE LOT OF BLOOD AT 3 AM. HAS TCS SET UP FOR LATE MAY. HAS AN ILEOSTOMY. SAW BLOOD(2-3 TIMES). RECOMMEND PT GO TO ED. FEELS OK RIGHT NOW. DISCUSSED OPTIONS: 1. GO TO ED FOR LARGE AMOUNT OF BLEEDING AND BE ADMITTED FOR TCS. WILL CONTACT PT TO SET UP TCS FOR MON APR 27. PT NEEDS 2 TAP WATER ENEMAS IN ENDOSCOPY & PHENERGAN 12.5 MG IV.

## 2013-09-20 NOTE — H&P (Signed)
[facility] History and Physical  [name] NGE:[id number] DOB: 1951-02-01 DOA: 09/20/2013  Referring physician: Dr. [name]. PCP: [name], MD   Chief Complaint: Rectal bleeding.  HPI: [name] is a 63 y.o. male  This is a 63 year old man who has a history of stage IV colorectal cancer and has had surgery and radiotherapy as well as chemotherapy previously. His initial surgery was in 2008 which was then followed by further surgery on the same admission. It looks like he has had a diverting ileostomy. He now presents with rectal bleeding which started approximately 12 hours ago which was quite profuse with clots. He feels systemically well. He was seen by gastroenterology, Dr. [name], who performed a flexible sigmoidoscopy, and the rectal mass was found, the description of which is as below: COLON FINDINGS: A near circumferential fungating mass with friable  surfaces was found in the rectum. ACTIVELY OOZING. CLOTS SEEN IN  LUMEN AND ASPIRATED. Multiple biopsies were performed using cold  forceps. Thermal therapy(BICAP 7 Fr 25W) was used to ATTEMPT TO  control bleeding. HEMOSTASIS NOT ACHEIVED Since hemostasis was difficult to achieve, he is now being admitted for further management.  Review of Systems:  Constitutional:  No weight loss, night sweats, Fevers, chills, fatigue.  HEENT:  No headaches, Difficulty swallowing,Tooth/dental problems,Sore throat,  No sneezing, itching, ear ache, nasal congestion, post nasal drip,  Cardio-vascular:  No chest pain, Orthopnea, PND, swelling in lower extremities, anasarca, dizziness, palpitations   Resp:  No shortness of breath with exertion or at rest. No excess mucus, no productive cough, No non-productive cough, No coughing up of blood.No change in color of mucus.No wheezing.No chest wall deformity  Skin:  no rash or lesions.  GU:  no dysuria, change in color of urine, no urgency or frequency. No flank pain.    Musculoskeletal:  No joint pain or swelling. No decreased range of motion. No back pain.  Psych:  No change in mood or affect. No depression or anxiety. No memory loss.   Past Medical History  Diagnosis Date  . Pancytopenia, acquired 12/04/2010  . B12 deficiency 12/07/2010  . Kidney stones   . CAD (coronary artery disease)   . Anemia   . History of radiation therapy 06/19/07 -07/27/07    whole pelvis  . Hx of radiation therapy 08/06/12,08/09/12,& 08/13/12    rul [id number]  . Cancer     Colon ca dx 2008 surg/rad/chemo  . Lung cancer   . FH: chemotherapy   . Myocardial infarction    Past Surgical History  Procedure Laterality Date  . Ileostomy  12/07/2010    Procedure: ILEOSTOMY;  Surgeon: Donato Heinz;  Location: [facility];  Service: General;  Laterality: N/A;  Diverting Ileostomy Lysis of adhesions Exploratory Laparotomy  . Lung biopsy  10/27/10    rt lobe- adenocarcinoma  . Cholecystectomy    . Cataract extraction w/ intraocular lens implant  2007    bil  . Lithotripsy  2002  . Coronary angioplasty with stent placement  2003    stenting x 2  . Colon resection  2008    x2  . Esophagogastroduodenoscopy N/A 05/02/2013    Procedure: ESOPHAGOGASTRODUODENOSCOPY (EGD);  Surgeon: Daneil Dolin, MD;  Location: [facility];  Service: Endoscopy;  Laterality: N/A;  . Givens capsule study N/A 05/02/2013    Procedure: GIVENS CAPSULE STUDY;  Surgeon: Daneil Dolin, MD;  Location: [facility];  Service: Endoscopy;  Laterality: N/A;   Social History:  reports that he has quit smoking. His smoking use included Cigarettes and Cigars. He has a 60 pack-year smoking history. He has quit using smokeless tobacco. His smokeless tobacco use included Chew. He reports that he drinks alcohol. He reports that he does not use illicit drugs.  Allergies  Allergen Reactions  . Daypro [Oxaprozin] Nausea And Vomiting    Family History  Problem Relation Age of Onset  . Cervical cancer Mother   . Rectal  cancer Father 43  . Uterine cancer Sister      Prior to Admission medications   Medication Sig Start Date End Date Taking? Authorizing Provider  acetaminophen (TYLENOL) 325 MG tablet Take 325 mg by mouth every 4 (four) hours as needed.   Yes Historical Provider, MD  cyanocobalamin (,VITAMIN B-12,) 1000 MCG/ML injection Inject 1,000 mcg into the muscle every 30 (thirty) days.    Yes Historical Provider, MD  HYDROcodone-acetaminophen (NORCO/VICODIN) 5-325 MG per tablet Take 1-2 tablets by mouth every 6 (six) hours as needed. 09/10/13  Yes Baird Cancer, PA-C  lidocaine-prilocaine (EMLA) cream Apply 1 application topically as needed. Apply quarter sized amount over port-a-cath site one hour prior to chemotherapy appointment. 03/30/12  Yes Pieter Partridge, MD   Physical Exam: Filed Vitals:   09/20/13 1555  BP: 164/77  Pulse: 68  Temp: 98.2 F (36.8 C)  Resp: 18    BP 164/77  Pulse 68  Temp(Src) 98.2 F (36.8 C) (Oral)  Resp 18  Ht 5\' 10"  (1.778 m)  Wt 96.7 kg (213 lb 3 oz)  BMI 30.59 kg/m2  SpO2 100%  General:  Appears calm and comfortable. He looks clinically dehydrated. Somewhat pale. Eyes: PERRL, normal lids, irises & conjunctiva ENT: grossly normal hearing, lips & tongue Neck: no LAD, masses or thyromegaly Cardiovascular: RRR, no m/r/g. No LE edema. Telemetry: SR, no arrhythmias  Respiratory: CTA bilaterally, no w/r/r. Normal respiratory effort. Abdomen: soft, ntnd. Rectal examination was not done as he already  had a flexible sigmoidoscopy today. Skin: no rash or induration seen on limited exam Musculoskeletal: grossly normal tone BUE/BLE Psychiatric: grossly normal mood and affect, speech fluent and appropriate Neurologic: grossly non-focal.          Labs on Admission:  Basic Metabolic Panel:  Recent Labs Lab 09/20/13 1305  NA 143  K 3.5*  CL 108  CO2 23  GLUCOSE 97  BUN 14  CREATININE 2.17*  CALCIUM 8.9   Liver Function Tests:  Recent Labs Lab  09/20/13 1305  AST 30  ALT 15  ALKPHOS 127*  BILITOT 1.2  PROT 6.3  ALBUMIN 3.2*     CBC:  Recent Labs Lab 09/20/13 1305  WBC 3.9*  NEUTROABS 3.1  HGB 9.3*  HCT 28.8*  MCV 85.7  PLT 41*      Radiological Exams on Admission: No results found.    Assessment/Plan   1. Rectal bleeding from a friable rectal mass. Biopsies have been taken. Stage IV adenocarcinoma of the rectum. 2. Acute on chronic renal failure, secondary to dehydration. 3. Pancytopenia. 4. Type 2 diabetes mellitus.  Plan: 1. Admit to medical floor. 2. Intravenous fluids. 3. Monitor hemoglobin. He will likely need blood transfusion. 4. Appreciate gastroenterology recommendations. He may need surgical consultation.   Further recommendations will depend on patient's hospital progress.  Code Status: Full code.  Family Communication: I discussed the plan with patient at the bedside.   Disposition Plan: Home when medically stable.   Time spent: 45 minutes.  Nimish C  [facility] Pager [phone].

## 2013-09-20 NOTE — Telephone Encounter (Signed)
Pt called this morning because he is having bleeding from his rectal area. He had a Ileostomy bag and no blood coming into the bag and normal stools in it also. He is have some left sided pain. He stated that is started last week then stopped until this morning at 4 am. He has an appointment on May 21 @ 1:30 but is worried something might be wrong since the blood is coming out of his rectal area. Please advise? Call back number is [phone]

## 2013-09-21 ENCOUNTER — Inpatient Hospital Stay (HOSPITAL_COMMUNITY): Payer: Managed Care, Other (non HMO)

## 2013-09-21 DIAGNOSIS — D696 Thrombocytopenia, unspecified: Secondary | ICD-10-CM

## 2013-09-21 DIAGNOSIS — E119 Type 2 diabetes mellitus without complications: Secondary | ICD-10-CM

## 2013-09-21 DIAGNOSIS — D539 Nutritional anemia, unspecified: Secondary | ICD-10-CM

## 2013-09-21 LAB — CBC
HCT: 25.5 % — ABNORMAL LOW (ref 39.0–52.0)
HCT: 25.7 % — ABNORMAL LOW (ref 39.0–52.0)
Hemoglobin: 8.2 g/dL — ABNORMAL LOW (ref 13.0–17.0)
Hemoglobin: 8.3 g/dL — ABNORMAL LOW (ref 13.0–17.0)
MCH: 28 pg (ref 26.0–34.0)
MCH: 27.9 pg (ref 26.0–34.0)
MCHC: 32.2 g/dL (ref 30.0–36.0)
MCHC: 32.3 g/dL (ref 30.0–36.0)
MCV: 87 fL (ref 78.0–100.0)
MCV: 86.2 fL (ref 78.0–100.0)
Platelets: 46 10*3/uL — ABNORMAL LOW (ref 150–400)
Platelets: 41 10*3/uL — ABNORMAL LOW (ref 150–400)
RBC: 2.93 MIL/uL — ABNORMAL LOW (ref 4.22–5.81)
RBC: 2.98 MIL/uL — ABNORMAL LOW (ref 4.22–5.81)
RDW: 23.5 % — AB (ref 11.5–15.5)
RDW: 23.1 % — ABNORMAL HIGH (ref 11.5–15.5)
WBC: 3.2 10*3/uL — AB (ref 4.0–10.5)
WBC: 3.7 10*3/uL — AB (ref 4.0–10.5)

## 2013-09-21 LAB — GLUCOSE, CAPILLARY
GLUCOSE-CAPILLARY: 78 mg/dL (ref 70–99)
GLUCOSE-CAPILLARY: 97 mg/dL (ref 70–99)
GLUCOSE-CAPILLARY: 116 mg/dL — AB (ref 70–99)
Glucose-Capillary: 74 mg/dL (ref 70–99)
Glucose-Capillary: 105 mg/dL — ABNORMAL HIGH (ref 70–99)

## 2013-09-21 LAB — COMPREHENSIVE METABOLIC PANEL
ALK PHOS: 115 U/L (ref 39–117)
ALT: 14 U/L (ref 0–53)
AST: 25 U/L (ref 0–37)
Albumin: 2.9 g/dL — ABNORMAL LOW (ref 3.5–5.2)
BUN: 12 mg/dL (ref 6–23)
CALCIUM: 8.7 mg/dL (ref 8.4–10.5)
CO2: 23 meq/L (ref 19–32)
Chloride: 111 mEq/L (ref 96–112)
Creatinine, Ser: 2.13 mg/dL — ABNORMAL HIGH (ref 0.50–1.35)
GFR, EST AFRICAN AMERICAN: 37 mL/min — AB (ref >90)
GFR, EST NON AFRICAN AMERICAN: 32 mL/min — AB (ref >90)
Glucose, Bld: 77 mg/dL (ref 70–99)
Potassium: 3.7 mEq/L (ref 3.7–5.3)
SODIUM: 144 meq/L (ref 137–147)
TOTAL PROTEIN: 6.1 g/dL (ref 6.0–8.3)
Total Bilirubin: 1.2 mg/dL (ref 0.3–1.2)

## 2013-09-21 MED ORDER — OXYCODONE HCL 5 MG PO TABS
5.0000 mg | ORAL_TABLET | Freq: Four times a day (QID) | ORAL | Status: DC | PRN
  Administered 2013-09-21 – 2013-09-27 (×7): 5 mg via ORAL
  Filled 2013-09-21 (×7): qty 1

## 2013-09-21 NOTE — Progress Notes (Signed)
[facility] PROGRESS NOTE  [name] NGE:[id number] DOB: 29-Oct-1950 DOA: 09/20/2013 PCP: [name], MD  Assessment/Plan: 1. GI bleeding from rectal mass. Patient has a history of stage IV colorectal cancer and had undergone surgery/radiotherapy as well as chemotherapy. He currently has a ileostomy in his right lower quadrant. It appears that the mass involves and bleeding was found in the remaining part of rectum. Status post flexible sigmoidoscopy with results as below. Bleeding appears to be slowing. May benefit from further radiation after input from hematology/oncology. May need interventional radiology intervention if bleeding recurs. We'll continue to monitor for now. Follow up biopsies. 2. Acute blood loss anemia, on chronic disease. We will continue to follow hemoglobin. Transfuse for hemoglobin less than 8. 3. 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 We'll need to transfuse platelets if patient continues to bleed. Since his bleeding appears to be improving, will hold off on transfusion at this time per 4. Acute renal failure. 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 His creatinine does appear to be about baseline. He is does not appear particularly depleted. His worsening renal function may be related to his anemia. Renal ultrasound is indicated left-sided hydronephrosis and therefore urology will be consulted. Continue with IV fluids and follow. Monitor urine output. 5. Diabetes. Continue sliding scale insulin.  Code Status: full code Family Communication: discussed with patient and family Disposition Plan: discharge home once improved   Consultants: Gastroenterology  Procedures: Flex sig: RECTAL BLEEDING DUE TO Near circumferential mass in the rectum    Antibiotics:    HPI/Subjective: Feels that rectal bleeding is beginning to subside.  No other complaints.  Objective: Filed Vitals:   09/21/13 1410  BP: 141/72   Pulse: 69  Temp: 98.7 F (37.1 C)  Resp: 20   No intake or output data in the 24 hours ending 09/21/13 1737 Filed Weights   09/20/13 1238 09/20/13 1651  Weight: 102.059 kg (225 lb) 96.7 kg (213 lb 3 oz)    Exam:   General:  NAD  Cardiovascular: S1, S2 RRR  Respiratory: CTA B  Abdomen: soft, nt, ileostomy in RLQ  Musculoskeletal: no edema b/l   Data Reviewed: Basic Metabolic Panel:  Recent Labs Lab 09/20/13 1305 09/21/13 0603  NA 143 144  K 3.5* 3.7  CL 108 111  CO2 23 23  GLUCOSE 97 77  BUN 14 12  CREATININE 2.17* 2.13*  CALCIUM 8.9 8.7   Liver Function Tests:  Recent Labs Lab 09/20/13 1305 09/21/13 0603  AST 30 25  ALT 15 14  ALKPHOS 127* 115  BILITOT 1.2 1.2  PROT 6.3 6.1  ALBUMIN 3.2* 2.9*   No results found for this basename: LIPASE, AMYLASE,  in the last 168 hours No results found for this basename: AMMONIA,  in the last 168 hours CBC:  Recent Labs Lab 09/20/13 1305 09/21/13 0603  WBC 3.9* 3.2*  NEUTROABS 3.1  --   HGB 9.3* 8.2*  HCT 28.8* 25.5*  MCV 85.7 87.0  PLT 41* 46*   Cardiac Enzymes: No results found for this basename: CKTOTAL, CKMB, CKMBINDEX, TROPONINI,  in the last 168 hours BNP (last 3 results) No results found for this basename: PROBNP,  in the last 8760 hours CBG:  Recent Labs Lab 09/20/13 2320 09/21/13 0454 09/21/13 0742 09/21/13 1201 09/21/13 1728  GLUCAP 89 78 97 74 105*    No results found for this or any previous visit (from the past 240 hour(s)).   Studies: US Renal  09/21/2013   CLINICAL DATA:  Acute renal failure, rectal carcinoma.  EXAM: RENAL/URINARY TRACT ULTRASOUND COMPLETE  COMPARISON:  CT 09/03/2013  FINDINGS: Right Kidney:  Length: 12.9. No hydronephrosis. Several anechoic cysts in the upper pole.  Left Kidney:  Length: 14.5. Moderate hydronephrosis of the left renal collecting system. The left ureter is dilated proximally. These findings are similar to comparison CT. There is a 1.2 cm calculus  in the mid to lower pole of the left kidney.  Bladder:  Appears normal.  Ureteral jets not identified.  IMPRESSION: 1. Left hydronephrosis and hydroureter suggests distal obstruction. These findings are similar to CT 09/03/2013. 2. Left nephrolithiasis. 3. Bladder appears normal.   Electronically Signed   By: Suzy Bouchard M.D.   On: 09/21/2013 14:51    Scheduled Meds: . insulin aspart  0-5 Units Subcutaneous QHS  . insulin aspart  0-9 Units Subcutaneous TID WC   Continuous Infusions: . sodium chloride 100 mL/hr at 09/21/13 0224    Active Problems:   DIABETES MELLITUS, TYPE II   Adenocarcinoma of rectum   Pancytopenia, acquired   Rectal bleeding   ARF (acute renal failure)    Time spent: 29mins    Gazelle Towe  [facility] Pager [phone]. If 7PM-7AM, please contact night-coverage at [email or web address], password [facility] 09/21/2013, 5:37 PM  LOS: 1 day

## 2013-09-21 NOTE — Progress Notes (Signed)
Subjective: Since I last evaluated the patient RECTAL BLEEDING HAS SLOWED DOWN. ONLY SAW A LITTLE WHEN HE WIPED. TOLERATING POS. Cr ELEVATED OVER THE PAST 24 HRS. STATES DR. MANNING WOULD CONSIDER XRT TO CONTROL BLEEDING. NO NAUSEA, VOMITING, OR ABD PAIN.  Objective: Vital signs in last 24 hours: Temp:  [98.2 F (36.8 C)-98.4 F (36.9 C)] 98.4 F (36.9 C) (04/25 0456) Pulse Rate:  [68-92] 70 (04/25 0456) Resp:  [15-20] 20 (04/25 0243) BP: (119-166)/(56-101) 163/81 mmHg (04/25 0456) SpO2:  [95 %-100 %] 98 % (04/25 0456) Weight:  [213 lb 3 oz (96.7 kg)-225 lb (102.059 kg)] 213 lb 3 oz (96.7 kg) (04/24 1651) Last BM Date: 09/20/13  Intake/Output from previous day: 04/24 0701 - 04/25 0700 In: 1000 [I.V.:1000] Out: -  Intake/Output this shift:    General appearance: alert, cooperative and no distress Resp: clear to auscultation bilaterally Cardio: regular rate and rhythm GI: soft, non-tender; bowel sounds normal;ILEOSTOMY IN RLQ Lab Results:  Recent Labs  09/20/13 1305 09/21/13 0603  WBC 3.9* 3.2*  HGB 9.3* 8.2*  HCT 28.8* 25.5*  PLT 41* 46*   BMET  Recent Labs  09/20/13 1305 09/21/13 0603  NA 143 144  K 3.5* 3.7  CL 108 111  CO2 23 23  GLUCOSE 97 77  BUN 14 12  CREATININE 2.17* 2.13*  CALCIUM 8.9 8.7   LFT  Recent Labs  09/21/13 0603  PROT 6.1  ALBUMIN 2.9*  AST 25  ALT 14  ALKPHOS 115  BILITOT 1.2   PT/INR  Recent Labs  09/20/13 1305  LABPROT 14.5  INR 1.15   Hepatitis Panel No results found for this basename: HEPBSAG, HCVAB, HEPAIGM, HEPBIGM,  in the last 72 hours C-Diff No results found for this basename: CDIFFTOX,  in the last 72 hours Fecal Lactopherrin No results found for this basename: FECLLACTOFRN,  in the last 72 hours  Studies/Results: No results found.  Medications: I have reviewed the patient's current medications.  Assessment/Plan: ADMITTED WITH RECTAL BLEEDING DUE TO RECURRENT RECTAL CA. BLEEDING IMPROVED/ HB DROPPED 1  GM. Cr ELEVATED OVER LAST 24 HRS. RECTAL MASS INVOLVED URETER.  PLAN: 1. CONTACTED DR. [place]. PT WILL CONTACT HIM ON MON. 2. DISCUSSED WITH DR. Kary Kos). PT COULD BE A CANDIDATE FOR EMBOLIZATION BUT WOULD AVOID AT THIS MOMENT DUE TO ELEVATED CREATININE. RECOMMENDED AGGRESSIVE IV HYDRATION AND CRYOPRECIPITATE/PLATELETS. 3. RENAL U/S TO EVALUATE FOR HYDRONEPHROSIS 4. ADVANCE DIET   LOS: 1 day   Remiel Corti L Bayleigh Loflin 09/21/2013, 8:29 AM

## 2013-09-22 DIAGNOSIS — N1330 Unspecified hydronephrosis: Secondary | ICD-10-CM

## 2013-09-22 LAB — SURGICAL PCR SCREEN
MRSA, PCR: NEGATIVE
Staphylococcus aureus: NEGATIVE

## 2013-09-22 LAB — CBC
HEMATOCRIT: 24.8 % — AB (ref 39.0–52.0)
Hemoglobin: 8.1 g/dL — ABNORMAL LOW (ref 13.0–17.0)
MCH: 28.2 pg (ref 26.0–34.0)
MCHC: 32.7 g/dL (ref 30.0–36.0)
MCV: 86.4 fL (ref 78.0–100.0)
Platelets: 47 10*3/uL — ABNORMAL LOW (ref 150–400)
RBC: 2.87 MIL/uL — AB (ref 4.22–5.81)
RDW: 23 % — ABNORMAL HIGH (ref 11.5–15.5)
WBC: 3.3 10*3/uL — AB (ref 4.0–10.5)

## 2013-09-22 LAB — BASIC METABOLIC PANEL
BUN: 12 mg/dL (ref 6–23)
CO2: 23 meq/L (ref 19–32)
CREATININE: 2.22 mg/dL — AB (ref 0.50–1.35)
Calcium: 8.5 mg/dL (ref 8.4–10.5)
Chloride: 111 mEq/L (ref 96–112)
GFR calc Af Amer: 35 mL/min — ABNORMAL LOW (ref >90)
GFR calc non Af Amer: 30 mL/min — ABNORMAL LOW (ref >90)
Glucose, Bld: 95 mg/dL (ref 70–99)
Potassium: 3.8 mEq/L (ref 3.7–5.3)
Sodium: 143 mEq/L (ref 137–147)

## 2013-09-22 LAB — GLUCOSE, CAPILLARY
GLUCOSE-CAPILLARY: 107 mg/dL — AB (ref 70–99)
GLUCOSE-CAPILLARY: 108 mg/dL — AB (ref 70–99)
Glucose-Capillary: 84 mg/dL (ref 70–99)
Glucose-Capillary: 105 mg/dL — ABNORMAL HIGH (ref 70–99)
Glucose-Capillary: 87 mg/dL (ref 70–99)
Glucose-Capillary: 143 mg/dL — ABNORMAL HIGH (ref 70–99)

## 2013-09-22 NOTE — Progress Notes (Addendum)
  Radiation Oncology         [phone]) [phone] ________________________________  Name: [name] MRN: [id number]  Date: 09/20/2013  DOB: 1950-12-06  Chart Note:    I reviewed this patient's most recent findings and wanted to take a minute to document my impression.  He underwent low anterior resection for a 2.7 cm stage T3 rectal cancer with 3 positive nodes out of 8 sampled (N1) on 04/22/2007, followed by pelvic radiotherapy under my direction.  He subsequently developed metastatic disease, and has had successful control with chemo and stereotactic radiotherapy for lung metastases.  Recently, he developed rectal bleeding and was found to have a recurrent rectal mass.  Biopsy is pending.  He received 45 Gy in 25 fractions to a 4-field set-up represented by these anterior and lateral ports:    He received a boost to 54 Gy with 5 more fractions to fields represented below:   The previous treatment represents definitive radiation.  Further radiation could be considered for palliation/hemostasis for his current bleeding using conformal techniques to cover the tumor but limit re-irradiation to the bladder, bowel, and hips.  If the patient is hemodynamically stable for hospital discharge, it would be helpful to pursue PET-CT for re-staging purposes in the outpatient setting.  Also, given the anatomic changes associated with his previous LAR and pelvic radiotherapy, I would consider pelvic MRI to better delineate the current pelvic/rectal mass for radiation targeting.  ________________________________  Sheral Apley [name], M.D.    Addendum: If the patient is not stable for discharge and biopsy confirms recurrent tumor, I would suggest transfer to [facility] for consideration of radiotherapy.

## 2013-09-22 NOTE — Progress Notes (Signed)
Subjective: Since I last evaluated the patient [place]. RENAL U/S SHOWS LEFT HYDRONEPHROSIS.   Objective: Vital signs in last 24 hours: Temp:  [98.1 F (36.7 C)-98.7 F (37.1 C)] 98.1 F (36.7 C) (04/26 0457) Pulse Rate:  [69-72] 72 (04/25 2105) Resp:  [20] 20 (04/26 0457) BP: (141-171)/(69-81) 171/81 mmHg (04/26 0457) SpO2:  [98 %-100 %] 99 % (04/26 0457) Last BM Date: 09/21/13  Intake/Output from previous day: 04/25 0701 - 04/26 0700 In: 3618.3 [I.V.:3618.3] Out: -  Intake/Output this shift:    General appearance: alert, cooperative and no distress Resp: clear to auscultation bilaterally Cardio: regular rate and rhythm GI: soft, non-tender; bowel sounds normal; no masses,  no organomegaly  Lab Results:  Recent Labs  09/21/13 0603 09/21/13 1901 09/22/13 0618  WBC 3.2* 3.7* 3.3*  HGB 8.2* 8.3* 8.1*  HCT 25.5* 25.7* 24.8*  PLT 46* 41* 47*   BMET  Recent Labs  09/20/13 1305 09/21/13 0603 09/22/13 0618  NA 143 144 143  K 3.5* 3.7 3.8  CL 108 111 111  CO2 23 23 23   GLUCOSE 97 77 95  BUN 14 12 12   CREATININE 2.17* 2.13* 2.22*  CALCIUM 8.9 8.7 8.5   LFT  Recent Labs  09/21/13 0603  PROT 6.1  ALBUMIN 2.9*  AST 25  ALT 14  ALKPHOS 115  BILITOT 1.2   PT/INR  Recent Labs  09/20/13 1305  LABPROT 14.5  INR 1.15    Studies/Results: US Renal  09/21/2013   CLINICAL DATA:  Acute renal failure, rectal carcinoma.  EXAM: RENAL/URINARY TRACT ULTRASOUND COMPLETE  COMPARISON:  CT 09/03/2013  FINDINGS: Right Kidney:  Length: 12.9. No hydronephrosis. Several anechoic cysts in the upper pole.  Left Kidney:  Length: 14.5. Moderate hydronephrosis of the left renal collecting system. The left ureter is dilated proximally. These findings are similar to comparison CT. There is a 1.2 cm calculus in the mid to lower pole of the left kidney.  Bladder:  Appears normal.  Ureteral jets not identified.  IMPRESSION: 1. Left hydronephrosis and hydroureter  suggests distal obstruction. These findings are similar to CT 09/03/2013. 2. Left nephrolithiasis. 3. Bladder appears normal.   Electronically Signed   By: Suzy Bouchard M.D.   On: 09/21/2013 14:51    Medications: I have reviewed the patient's current medications.  Assessment/Plan: ADMITTED WITH RECTAL BLEEDING. NOW RESOLVED.  PLAN: 1. UROLOGY CONSULT APR 27 2. HAVE CONTACTED DR. MANNING REGARDING FINDINGS ON FLEX SIG. 3. SUPPORTIVE CARE    LOS: 2 days   Adhira Jamil L [employer] 09/22/2013, 10:20 AM

## 2013-09-22 NOTE — Progress Notes (Signed)
[facility] PROGRESS NOTE  [name] EEF:[id number] DOB: 03/03/1951 DOA: 09/20/2013 PCP: [name], MD  Assessment/Plan: 1. GI bleeding from rectal mass. Patient has a history of stage IV colorectal cancer and had undergone surgery/radiotherapy as well as chemotherapy. He currently has a ileostomy in his right lower quadrant. It appears that the has a recurrent rectal mass which began to bleed. Status post flexible sigmoidoscopy with results as below. Bleeding appears to be slowing. May benefit from further radiation based on input from radiation oncology. May need interventional radiology intervention if bleeding recurs. We'll continue to monitor for now. Follow up biopsies. 2. Acute blood loss anemia, on chronic disease. We will continue to follow hemoglobin. Appears to be stable at this time. Transfuse for hemoglobin less than 8. 3. 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 We'll need to transfuse platelets if patient continues to bleed. Since his bleeding appears to be improving, will hold off on transfusion at this time 4. Acute renal failure. The patient likely has chronic renal disease stage III. His creatinine does appear to be above baseline. He does not appear to be particularly volume depleted. His worsening renal function may be related to his significant anemia. Renal ultrasound is indicated left-sided hydronephrosis and therefore urology consult has been requested. Await input. Continue with IV fluids and follow. Monitor urine output. 5. Diabetes. Continue sliding scale insulin.  Code Status: full code Family Communication: discussed with patient and family Disposition Plan: discharge home once improved   Consultants: Gastroenterology  Procedures: Flex sig: RECTAL BLEEDING DUE TO Near circumferential mass in the rectum    Antibiotics:    HPI/Subjective: Rectal bleeding improving. No lightheadedness or  dizziness  Objective: Filed Vitals:   09/22/13 1402  BP: 143/79  Pulse: 71  Temp: 98 F (36.7 C)  Resp: 18    Intake/Output Summary (Last 24 hours) at 09/22/13 1901 Last data filed at 09/22/13 1825  Gross per 24 hour  Intake 4098.33 ml  Output      0 ml  Net 4098.33 ml   Filed Weights   09/20/13 1238 09/20/13 1651  Weight: 102.059 kg (225 lb) 96.7 kg (213 lb 3 oz)    Exam:   General:  NAD  Cardiovascular: S1, S2 RRR  Respiratory: CTA B  Abdomen: soft, nt, ileostomy in RLQ  Musculoskeletal: no edema b/l   Data Reviewed: Basic Metabolic Panel:  Recent Labs Lab 09/20/13 1305 09/21/13 0603 09/22/13 0618  NA 143 144 143  K 3.5* 3.7 3.8  CL 108 111 111  CO2 23 23 23   GLUCOSE 97 77 95  BUN 14 12 12   CREATININE 2.17* 2.13* 2.22*  CALCIUM 8.9 8.7 8.5   Liver Function Tests:  Recent Labs Lab 09/20/13 1305 09/21/13 0603  AST 30 25  ALT 15 14  ALKPHOS 127* 115  BILITOT 1.2 1.2  PROT 6.3 6.1  ALBUMIN 3.2* 2.9*   No results found for this basename: LIPASE, AMYLASE,  in the last 168 hours No results found for this basename: AMMONIA,  in the last 168 hours CBC:  Recent Labs Lab 09/20/13 1305 09/21/13 0603 09/21/13 1901 09/22/13 0618  WBC 3.9* 3.2* 3.7* 3.3*  NEUTROABS 3.1  --   --   --   HGB 9.3* 8.2* 8.3* 8.1*  HCT 28.8* 25.5* 25.7* 24.8*  MCV 85.7 87.0 86.2 86.4  PLT 41* 46* 41* 47*   Cardiac Enzymes: No results found for this basename: CKTOTAL, CKMB, CKMBINDEX, TROPONINI,  in the last  168 hours BNP (last 3 results) No results found for this basename: PROBNP,  in the last 8760 hours CBG:  Recent Labs Lab 09/22/13 0033 09/22/13 0453 09/22/13 0728 09/22/13 1118 09/22/13 1637  GLUCAP 108* 107* 84 105* 87    No results found for this or any previous visit (from the past 240 hour(s)).   Studies: US Renal  09/21/2013   CLINICAL DATA:  Acute renal failure, rectal carcinoma.  EXAM: RENAL/URINARY TRACT ULTRASOUND COMPLETE  COMPARISON:   CT 09/03/2013  FINDINGS: Right Kidney:  Length: 12.9. No hydronephrosis. Several anechoic cysts in the upper pole.  Left Kidney:  Length: 14.5. Moderate hydronephrosis of the left renal collecting system. The left ureter is dilated proximally. These findings are similar to comparison CT. There is a 1.2 cm calculus in the mid to lower pole of the left kidney.  Bladder:  Appears normal.  Ureteral jets not identified.  IMPRESSION: 1. Left hydronephrosis and hydroureter suggests distal obstruction. These findings are similar to CT 09/03/2013. 2. Left nephrolithiasis. 3. Bladder appears normal.   Electronically Signed   By: Suzy Bouchard M.D.   On: 09/21/2013 14:51    Scheduled Meds: . insulin aspart  0-5 Units Subcutaneous QHS  . insulin aspart  0-9 Units Subcutaneous TID WC   Continuous Infusions: . sodium chloride 100 mL/hr at 09/21/13 2206    Active Problems:   DIABETES MELLITUS, TYPE II   Adenocarcinoma of rectum   Pancytopenia, acquired   Rectal bleeding   ARF (acute renal failure)    Time spent: 15mins    Jehanzeb Memon  [facility] Pager [phone]. If 7PM-7AM, please contact night-coverage at [email or web address], password [facility] 09/22/2013, 7:01 PM  LOS: 2 days

## 2013-09-22 NOTE — Consult Note (Signed)
Pt seen will dictate a note schedule cysto in sertion of double j stent under anesthesia am will schedule for 12noo am

## 2013-09-22 NOTE — Telephone Encounter (Signed)
noted 

## 2013-09-23 DIAGNOSIS — D61810 Antineoplastic chemotherapy induced pancytopenia: Secondary | ICD-10-CM

## 2013-09-23 DIAGNOSIS — N201 Calculus of ureter: Secondary | ICD-10-CM

## 2013-09-23 DIAGNOSIS — D61818 Other pancytopenia: Secondary | ICD-10-CM

## 2013-09-23 DIAGNOSIS — Z8679 Personal history of other diseases of the circulatory system: Secondary | ICD-10-CM

## 2013-09-23 DIAGNOSIS — T451X5A Adverse effect of antineoplastic and immunosuppressive drugs, initial encounter: Secondary | ICD-10-CM

## 2013-09-23 DIAGNOSIS — E538 Deficiency of other specified B group vitamins: Secondary | ICD-10-CM

## 2013-09-23 LAB — GLUCOSE, CAPILLARY
GLUCOSE-CAPILLARY: 75 mg/dL (ref 70–99)
Glucose-Capillary: 84 mg/dL (ref 70–99)
Glucose-Capillary: 120 mg/dL — ABNORMAL HIGH (ref 70–99)

## 2013-09-23 LAB — CBC
HCT: 24.4 % — ABNORMAL LOW (ref 39.0–52.0)
Hemoglobin: 7.9 g/dL — ABNORMAL LOW (ref 13.0–17.0)
MCH: 27.9 pg (ref 26.0–34.0)
MCHC: 32.4 g/dL (ref 30.0–36.0)
MCV: 86.2 fL (ref 78.0–100.0)
PLATELETS: 45 10*3/uL — AB (ref 150–400)
RBC: 2.83 MIL/uL — ABNORMAL LOW (ref 4.22–5.81)
RDW: 22.8 % — ABNORMAL HIGH (ref 11.5–15.5)
WBC: 3 10*3/uL — AB (ref 4.0–10.5)

## 2013-09-23 LAB — HEMOGLOBIN AND HEMATOCRIT, BLOOD
HCT: 29 % — ABNORMAL LOW (ref 39.0–52.0)
HEMOGLOBIN: 9.6 g/dL — AB (ref 13.0–17.0)

## 2013-09-23 LAB — BASIC METABOLIC PANEL
BUN: 11 mg/dL (ref 6–23)
CALCIUM: 8.5 mg/dL (ref 8.4–10.5)
CO2: 22 mEq/L (ref 19–32)
CREATININE: 2.33 mg/dL — AB (ref 0.50–1.35)
Chloride: 113 mEq/L — ABNORMAL HIGH (ref 96–112)
GFR calc non Af Amer: 29 mL/min — ABNORMAL LOW (ref >90)
GFR, EST AFRICAN AMERICAN: 33 mL/min — AB (ref >90)
Glucose, Bld: 79 mg/dL (ref 70–99)
Potassium: 3.7 mEq/L (ref 3.7–5.3)
SODIUM: 146 meq/L (ref 137–147)

## 2013-09-23 LAB — PREPARE RBC (CROSSMATCH)

## 2013-09-23 MED ORDER — PROPOFOL 10 MG/ML IV EMUL
INTRAVENOUS | Status: AC
  Filled 2013-09-23: qty 20

## 2013-09-23 MED ORDER — DIPHENHYDRAMINE HCL 25 MG PO CAPS
25.0000 mg | ORAL_CAPSULE | Freq: Once | ORAL | Status: AC
  Administered 2013-09-23: 25 mg via ORAL
  Filled 2013-09-23: qty 1

## 2013-09-23 MED ORDER — ACETAMINOPHEN 325 MG PO TABS
650.0000 mg | ORAL_TABLET | Freq: Once | ORAL | Status: AC
  Administered 2013-09-23: 650 mg via ORAL
  Filled 2013-09-23: qty 2

## 2013-09-23 MED ORDER — PANTOPRAZOLE SODIUM 40 MG PO TBEC
40.0000 mg | DELAYED_RELEASE_TABLET | Freq: Every day | ORAL | Status: DC
  Administered 2013-09-26 – 2013-09-28 (×3): 40 mg via ORAL
  Filled 2013-09-23 (×5): qty 1

## 2013-09-23 MED ORDER — SODIUM CHLORIDE 0.9 % IV SOLN
500.0000 mL | Freq: Once | INTRAVENOUS | Status: AC
  Administered 2013-09-23: 500 mL via INTRAVENOUS

## 2013-09-23 NOTE — Progress Notes (Signed)
Upon return to the room from the postponed Cystoscopy the the blood was finished it was running at 372ml/hr.  PT appeared to be in stable condition.  No complains or concerns at this time.  SEE blood flowsheet.

## 2013-09-23 NOTE — Progress Notes (Signed)
[place] Telephone:([phone]) [phone]   Fax:([phone]) [facility] CONSULT NOTE  REFERRING PHYSICIAN:Jehanseb Roderic Palau, M.D.  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 Now with rectal bleeding and mass found on sigmoidoscopy through the true rectum.  HPI [name] is a 63 y.o. male.  There is a hospital with rectal bleeding for about 12 hours prior to admission on 09/20/2013. The bleeding was profuse with clots. He was seen by his gastroenterologist to perform a flexible sigmoidoscopy in a rectal mass was found nearly circumferentially involving the true rectum with losing clots seen in the lumen and aspirated. Biopsies were done but results are not yet back. Thermal therapy was attempted to stop the bleeding which was unsuccessful. He was admitted therefore for further evaluation and treatment. His initial CT scan did not show evidence of hydronephrosis but on 09/21/2013 renal ultrasound reveal evidence of left hydronephrosis and for that reason he is to undergo cystoscopy today. He is resting comfortably in bed without significant pain. Peripheral paresthesias that had been extremely burdensome have improved in the upper extremities but still persistent lower extremity. He denies any fever, night sweats, PND, orthopnea, palpitations, abdominal distention, cough, wheezing, but with minimal lower extremity swelling without redness. He denies any chest pain, headache, or seizures. HPI  Past Medical History  Diagnosis Date  . Pancytopenia, acquired 12/04/2010  . B12 deficiency 12/07/2010  . Kidney stones   . CAD (coronary artery disease)   . Anemia   . History of radiation therapy 06/19/07 -07/27/07    whole pelvis  . Hx of radiation therapy 08/06/12,08/09/12,& 08/13/12    rul 54GY/25f  . Cancer     Colon ca dx 2008 surg/rad/chemo  . Lung cancer   . FH: chemotherapy   . Myocardial infarction    His past oncologic history is as  follows: 04/22/2007, low anterior resection for a T3 N1 carcinoma of the rectum, 2.7 cm in size, 3 of 8 lymph nodes positive.  06/19/2007 through 07/27/2007 [place]. 04/11/2011 through 03/16/2013 he received a total of 24 cycles of FOLFOX plus Avastin with reduced doses duie to immunosuppression as well as peripheral neuropathy. A chemotherapy holiday was implemented. 07/25/2012 through 08/13/2012 he received SBRT x5 (54Gy) to an isolated lung lesion which was biopsy proven metastatic adenocarcinoma. Since 03/16/2013 has not received any systemic chemotherapy. 07/22/2013 through 07/31/2013 he received SBRT x5(4Gy) to a second metastatic lung lesion. He has been on drug-holiday since February of 2014 after completing 24 cycles of FOLFOX and Avastin.  His last PET/CT result in May of 2014 showed; " Persistent malignant range FDG uptake associated with the right upper lobe nodule. The degree of FDG uptake is not significantly improved from previous exam. The nodule in the right upper lobe demonstrates interval cavitation compared with previous exam. Decrease in hypermetabolism associated with multifocal liver metastasis. On today's study there is no abnormal uptake within the liver above background activity." He has since undergone radiation therapy by Dr. MTammi Klippelto this right lung lesion. CT CAP on 04/18/2013 shows a new right apex of the lung lesion measuring 4.5 mm in size and is medial to aforementioned lung lesion. Most recent CT scan on 07/01/2013 demonstrates an increase in size of the lung lesion to 7.5 mm and there is now a second lesion measuring 9 mm in size in the medial aspect of  left upper lobe. With these increasing lesions, the patient was referred to [place] and he was seen by his primary Radiation Oncologist, Dr. Tyler Pita. It was agreed that [facility] qualified for SBRT treatment to these lesions  and this was performed on 2/23, 2/25, 3/3, and 08/02/2013.   Past Surgical History  Procedure Laterality Date  . Ileostomy  12/07/2010    Procedure: ILEOSTOMY;  Surgeon: Donato Heinz;  Location: [facility];  Service: General;  Laterality: N/A;  Diverting Ileostomy Lysis of adhesions Exploratory Laparotomy  . Lung biopsy  10/27/10    rt lobe- adenocarcinoma  . Cholecystectomy    . Cataract extraction w/ intraocular lens implant  2007    bil  . Lithotripsy  2002  . Coronary angioplasty with stent placement  2003    stenting x 2  . Colon resection  2008    x2  . Esophagogastroduodenoscopy N/A 05/02/2013    Procedure: ESOPHAGOGASTRODUODENOSCOPY (EGD);  Surgeon: Daneil Dolin, MD;  Location: [facility];  Service: Endoscopy;  Laterality: N/A;  . Givens capsule study N/A 05/02/2013    Procedure: GIVENS CAPSULE STUDY;  Surgeon: Daneil Dolin, MD;  Location: [facility] SU[facility];  Service: Endoscopy;  Laterality: N/A;    Family History  Problem Relation Age of Onset  . Cervical cancer Mother   . Rectal cancer Father 67  . Uterine cancer Sister     Social History History  Substance Use Topics  . Smoking status: Former Smoker -- 2.00 packs/day for 30 years    Types: Cigarettes, Cigars  . Smokeless tobacco: Former Systems developer    Types: Chew  . Alcohol Use: Yes     Comment: occasional beer    Allergies  Allergen Reactions  . Daypro [Oxaprozin] Nausea And Vomiting    Current Facility-Administered Medications  Medication Dose Route Frequency Provider Last Rate Last Dose  . 0.9 %  sodium chloride infusion   Intravenous Continuous Doree Albee, MD 100 mL/hr at 09/23/13 0309    . insulin aspart (novoLOG) injection 0-5 Units  0-5 Units Subcutaneous QHS Nimish C Gosrani, MD      . insulin aspart (novoLOG) injection 0-9 Units  0-9 Units Subcutaneous TID WC Nimish C Gosrani, MD      . ondansetron (ZOFRAN) tablet 4 mg  4 mg Oral Q6H PRN Nimish Luther Parody, MD       Or  . ondansetron (ZOFRAN) injection  4 mg  4 mg Intravenous Q6H PRN Nimish C Gosrani, MD      . oxyCODONE (Oxy IR/ROXICODONE) immediate release tablet 5 mg  5 mg Oral Q6H PRN Ritta Slot, NP   5 mg at 09/23/13 0118   Facility-Administered Medications Ordered in Other Encounters  Medication Dose Route Frequency Provider Last Rate Last Dose  . 0.9 %  sodium chloride infusion   Intravenous Continuous Pieter Partridge, MD 20 mL/hr at 07/17/12 1113    . heparin lock flush 100 unit/mL  500 Units Intravenous Once Pieter Partridge, MD      . sodium chloride 0.9 % injection 10 mL  10 mL Intravenous PRN Pieter Partridge, MD   10 mL at 07/17/12 1113    Review of Systems: Other than that discussed above is noncontributory.    Physical Exam Temperature 98.5, pulse 78, surgery rate 20, blood pressure 185/90 with O2 saturation 98%. GENERAL:alert, no distress and comfortable SKIN: skin color, texture, turgor are normal, no rashes or significant lesions EYES: normal, Conjunctiva are pink  and non-injected, sclera clear OROPHARYNX:no exudate, no erythema and lips, buccal mucosa, and tongue normal  NECK: supple, thyroid normal size, non-tender, without nodularity CHEST: Increased AP diameter with no gynecomastia. LifePort in place. LYMPH:  no palpable lymphadenopathy in the cervical, axillary or inguinal LUNGS: clear to auscultation and percussion with normal breathing effort HEART: regular rate & rhythm and no murmurs ABDOMEN:abdomen soft, non-tender and normal bowel sounds. Ileostomy in place with no free fluid or shifting dullness. MUSCULOSKELETALl:no cyanosis of digits, no clubbing or edema . Left CVA tenderness. NEURO: alert & oriented x 3 with fluent speech, no focal motor/sensory deficits. Decreased deep tendon reflexes both lower extremities.    PERFORMANCE STATUS: ECOG 2  LABORATORY DATA: Lab Results  Component Value Date   WBC 3.0* 09/23/2013   HGB 7.9* 09/23/2013   HCT 24.4* 09/23/2013   MCV 86.2 09/23/2013   PLT 45*  09/23/2013  Results for VIPUL, CAFARELLI (MRN [id number]) as of 09/23/2013 07:17  Ref. Range 09/23/2013 04:44  Sodium Latest Range: 137-147 mEq/L 146  Potassium Latest Range: 3.7-5.3 mEq/L 3.7  Chloride Latest Range: 96-112 mEq/L 113 (H)  CO2 Latest Range: 19-32 mEq/L 22  BUN Latest Range: 6-23 mg/dL 11  Creatinine Latest Range: 0.50-1.35 mg/dL 2.33 (H)  Calcium Latest Range: 8.4-10.5 mg/dL 8.5  GFR calc non Af Amer Latest Range: >90 mL/min 29 (L)  GFR calc Af Amer Latest Range: >90 mL/min 33 (L)  Glucose Latest Range: 70-99 mg/dL 79     RADIOGRAPHIC STUDIES: Ct Chest W Contrast  09/03/2013   CLINICAL DATA:  Metastatic rectal carcinoma. Left flank pain. Restaging.  EXAM: CT CHEST, ABDOMEN, AND PELVIS WITH CONTRAST  TECHNIQUE: Multidetector CT imaging of the chest, abdomen and pelvis was performed following the standard protocol during bolus administration of intravenous contrast.  CONTRAST:  138m OMNIPAQUE IOHEXOL 300 MG/ML  SOLN  COMPARISON:  [place] dated 05/01[place]2013; CT ABD - PELV W/ CM dated 04/18/2013; CT ABD - PELV W/ CM dated 01/14/2013  FINDINGS: CT CHEST FINDINGS  Left subclavian Port-A-Cath tip appears unchanged at the SVC right atrial junction. There are no enlarged mediastinal, hilar or axillary lymph nodes. Sebaceous cyst in the upper right back is unchanged.  There is stable mild atherosclerosis of the coronary arteries, aorta and great vessels. There is no significant pleural or pericardial effusion. The heart size is normal. Bilateral gynecomastia is noted.  There is stable scarring in the right upper lobe. The previously demonstrated 5 mm nodule medially in the right upper lobe is smaller and less well-defined on the axial images. No new or enlarging pulmonary nodules identified.  There are no worrisome osseous findings.  CT ABDOMEN AND PELVIS FINDINGS  Mild contour irregularity of the liver appears stable. There are no focal liver lesions. There is stable splenomegaly with a stable  possible infarct posteriorly in the spleen on image 55. The gallbladder is surgically absent. There is no biliary dilatation. The pancreas appears normal.  There is no adrenal mass. Multiple bilateral renal cysts are stable. There are bilateral renal calculi with a 1.7 cm calculus in the left renal pelvis. Dilatation of the left renal pelvis appears progressive and there is delayed contrast excretion from the left kidney. Both kidneys demonstrate mild cortical thinning.  The left ureter appears dilated into the pelvis where there is increasing oval soft tissue adjacent to the rectal suture line. This measures 3.9 x 3.0 cm on image 96 and is concerning for local recurrence with resulting ureteral obstruction. Small perirectal  lymph nodes and perirectal soft tissue stranding are stable. The bladder, prostate gland and seminal vesicles appear stable. There is a limited fat plane between the suspected local recurrence and the bladder, although no gross bladder invasion.  No ascites or other abdominal lymphadenopathy is seen. The stomach and small bowel appear stable with parastomal herniation of small bowel surrounding the right lower quadrant ileostomy  Chronic thoracolumbar compression deformities appear stable. There are no worrisome osseous findings.  IMPRESSION: 1. Increased soft tissue density adjacent to the rectal suture line worrisome for local recurrence of rectal adenocarcinoma. This is supported by apparent partial obstruction of the distal left ureter. 2. No other signs of metastatic disease within the abdomen or pelvis. 3. Previously demonstrated right upper lobe pulmonary nodule is less well-defined and smaller, consistent with a treated lesion. 4. Stable bilateral renal calculi. Calculus in the left renal pelvis does not appear to be obstructing the UPJ with the patient supine. 5. Stable parastomal herniation of small bowel surrounding the ileostomy.   Electronically Signed   By: Camie Patience M.D.   On:  09/03/2013 17:41   Ct Abdomen Pelvis W Contrast  09/03/2013   CLINICAL DATA:  Metastatic rectal carcinoma. Left flank pain. Restaging.  EXAM: CT CHEST, ABDOMEN, AND PELVIS WITH CONTRAST  TECHNIQUE: Multidetector CT imaging of the chest, abdomen and pelvis was performed following the standard protocol during bolus administration of intravenous contrast.  CONTRAST:  135m OMNIPAQUE IOHEXOL 300 MG/ML  SOLN  COMPARISON:  [place] dated 05/01/2013; CT ABD - PELV W/ CM dated 04/18/2013; CT ABD - PELV W/ CM dated 01/14/2013  FINDINGS: CT CHEST FINDINGS  Left subclavian Port-A-Cath tip appears unchanged at the SVC right atrial junction. There are no enlarged mediastinal, hilar or axillary lymph nodes. Sebaceous cyst in the upper right back is unchanged.  There is stable mild atherosclerosis of the coronary arteries, aorta and great vessels. There is no significant pleural or pericardial effusion. The heart size is normal. Bilateral gynecomastia is noted.  There is stable scarring in the right upper lobe. The previously demonstrated 5 mm nodule medially in the right upper lobe is smaller and less well-defined on the axial images. No new or enlarging pulmonary nodules identified.  There are no worrisome osseous findings.  CT ABDOMEN AND PELVIS FINDINGS  Mild contour irregularity of the liver appears stable. There are no focal liver lesions. There is stable splenomegaly with a stable possible infarct posteriorly in the spleen on image 55. The gallbladder is surgically absent. There is no biliary dilatation. The pancreas appears normal.  There is no adrenal mass. Multiple bilateral renal cysts are stable. There are bilateral renal calculi with a 1.7 cm calculus in the left renal pelvis. Dilatation of the left renal pelvis appears progressive and there is delayed contrast excretion from the left kidney. Both kidneys demonstrate mild cortical thinning.  The left ureter appears dilated into the pelvis where there is increasing  oval soft tissue adjacent to the rectal suture line. This measures 3.9 x 3.0 cm on image 96 and is concerning for local recurrence with resulting ureteral obstruction. Small perirectal lymph nodes and perirectal soft tissue stranding are stable. The bladder, prostate gland and seminal vesicles appear stable. There is a limited fat plane between the suspected local recurrence and the bladder, although no gross bladder invasion.  No ascites or other abdominal lymphadenopathy is seen. The stomach and small bowel appear stable with parastomal herniation of small bowel surrounding the right lower quadrant ileostomy  Chronic thoracolumbar compression deformities appear stable. There are no worrisome osseous findings.  IMPRESSION: 1. Increased soft tissue density adjacent to the rectal suture line worrisome for local recurrence of rectal adenocarcinoma. This is supported by apparent partial obstruction of the distal left ureter. 2. No other signs of metastatic disease within the abdomen or pelvis. 3. Previously demonstrated right upper lobe pulmonary nodule is less well-defined and smaller, consistent with a treated lesion. 4. Stable bilateral renal calculi. Calculus in the left renal pelvis does not appear to be obstructing the UPJ with the patient supine. 5. Stable parastomal herniation of small bowel surrounding the ileostomy.   Electronically Signed   By: Camie Patience M.D.   On: 09/03/2013 17:41   US Renal  09/21/2013   CLINICAL DATA:  Acute renal failure, rectal carcinoma.  EXAM: RENAL/URINARY TRACT ULTRASOUND COMPLETE  COMPARISON:  CT 09/03/2013  FINDINGS: Right Kidney:  Length: 12.9. No hydronephrosis. Several anechoic cysts in the upper pole.  Left Kidney:  Length: 14.5. Moderate hydronephrosis of the left renal collecting system. The left ureter is dilated proximally. These findings are similar to comparison CT. There is a 1.2 cm calculus in the mid to lower pole of the left kidney.  Bladder:  Appears normal.   Ureteral jets not identified.  IMPRESSION: 1. Left hydronephrosis and hydroureter suggests distal obstruction. These findings are similar to CT 09/03/2013. 2. Left nephrolithiasis. 3. Bladder appears normal.   Electronically Signed   By: Suzy Bouchard M.D.   On: 09/21/2013 14:51    PATHOLOGY: K-ras wild-type adenocarcinoma of the rectum  ASSESSMENT: #1. 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 Biopsy is pending. The ileostomy in place and functioning well. #2. Symptomatic left ureteral obstruction due to calculus with hydronephrosis, for cystoscopy today. #3. Coronary artery disease, status post stent x2 or 3, no evidence of dysrhythmia or heart failure at this time #4. Pancytopenia secondary to previous chemotherapy with rather severe anemia. #5. B12 deficiency. #6. Small bowel erosions by camera study done on 05/03/2013  PLAN: #1. Cystoscopy today. #2. Suggest transfusion of 2 units of packed blood cells (ordered). #3. Alternative treatment now include oral Xeloda plus radiotherapy by SBRT to the rectal area if possible because of overlapping fields from previous radiation to control bleeding. #4. Future systemic treatment probably to include cetuximab plus Xeloda in order to limit immunosuppression and still garner adequate response. Alternative treatment would include irinotecan which is significantly myelosuppressive but could be given on a weekly basis along with Xeloda to better control thrombocytopenia. I would favor Xeloda plus cetuximab of these choices  I appreciate the opportunity sharing in his care and will follow along with you while hospitalized.  The patient voices understanding of current disease status and treatment options and is in agreement with  the current care plan.  All questions were answered. The patient knows to call the clinic with any problems, questions or concerns. We can certainly see the patient much sooner if necessary.  Thank you so much for allowing me to participate in the care of [name]. I will continue to follow up the patient with you and assist in his care.  I spent 40 minutes counseling the patient face to face. The total time spent in the appointment was 55 minutes.  Farrel Gobble 09/23/2013, 7:18 AM

## 2013-09-23 NOTE — Progress Notes (Signed)
    Subjective: Small amount of bright red blood per rectum. No abdominal pain, N/V. NPO for procedure today.   Objective: Vital signs in last 24 hours: Temp:  [98 F (36.7 C)-98.5 F (36.9 C)] 98.5 F (36.9 C) (04/27 0456) Pulse Rate:  [70-78] 78 (04/27 0456) Resp:  [18-20] 20 (04/27 0456) BP: (143-183)/(79-90) 183/90 mmHg (04/27 0456) SpO2:  [98 %-100 %] 98 % (04/27 0456) Last BM Date: 09/21/13 General:   Alert and oriented, pleasant Head:  Normocephalic and atraumatic. Abdomen:  Bowel sounds present, soft, non-tender, non-distended. Ileostomy with pink stoma, loose, liquid stool in bag. Extremities:  Without clubbing or edema. Neurologic:  Alert and  oriented x4;  grossly normal neurologically. Psych:  Alert and cooperative. Normal mood and affect.  Intake/Output from previous day: 04/26 0701 - 04/27 0700 In: 2868.3 [P.O.:480; I.V.:2388.3] Out: -  Intake/Output this shift:    Lab Results:  Recent Labs  09/21/13 1901 09/22/13 0618 09/23/13 0444  WBC 3.7* 3.3* 3.0*  HGB 8.3* 8.1* 7.9*  HCT 25.7* 24.8* 24.4*  PLT 41* 47* 45*   BMET  Recent Labs  09/21/13 0603 09/22/13 0618 09/23/13 0444  NA 144 143 146  K 3.7 3.8 3.7  CL 111 111 113*  CO2 23 23 22   GLUCOSE 77 95 79  BUN 12 12 11   CREATININE 2.13* 2.22* 2.33*  CALCIUM 8.7 8.5 8.5   LFT  Recent Labs  09/20/13 1305 09/21/13 0603  PROT 6.3 6.1  ALBUMIN 3.2* 2.9*  AST 30 25  ALT 15 14  ALKPHOS 127* 115  BILITOT 1.2 1.2   PT/INR  Recent Labs  09/20/13 1305  LABPROT 14.5  INR 1.15   Studies/Results: US Renal  09/21/2013   CLINICAL DATA:  Acute renal failure, rectal carcinoma.  EXAM: RENAL/URINARY TRACT ULTRASOUND COMPLETE  COMPARISON:  CT 09/03/2013  FINDINGS: Right Kidney:  Length: 12.9. No hydronephrosis. Several anechoic cysts in the upper pole.  Left Kidney:  Length: 14.5. Moderate hydronephrosis of the left renal collecting system. The left ureter is dilated proximally. These findings are  similar to comparison CT. There is a 1.2 cm calculus in the mid to lower pole of the left kidney.  Bladder:  Appears normal.  Ureteral jets not identified.  IMPRESSION: 1. Left hydronephrosis and hydroureter suggests distal obstruction. These findings are similar to CT 09/03/2013. 2. Left nephrolithiasis. 3. Bladder appears normal.   Electronically Signed   By: Suzy Bouchard M.D.   On: 09/21/2013 14:51    Assessment: 63 year old male admitted with rectal bleeding with known prior history of  metastatic rectal cancer, s/p LAR in 2008. Flex sig this admission with circumferential rectal mass. Urology on board due to left-sided hydronephrosis. Hgb 7.9, 2 units PRBCs ordered this morning. For cystoscopy with stent placement today.   Plan: Supportive care Add PPI for GI prophylaxis Cystoscopy with stent placement today by Dr. [name] Agree with blood transfusion Will sign off; further care per Urology and [place], ANP-BC [facility]      LOS: 3 days    09/23/2013, 8:05 AM

## 2013-09-23 NOTE — Telephone Encounter (Signed)
Per Darius Bump patient had procedure on Friday.

## 2013-09-23 NOTE — Progress Notes (Signed)
Surgery rescheduled for tomorrow because he needs blood to have general anesthesia his hgb only 7 he will need 2 units of blood today his platelets only 40000.00 needs medical clearance for tomorrow

## 2013-09-23 NOTE — Care Management Note (Unsigned)
    Page 1 of 1   09/23/2013     4:28:21 PM CARE MANAGEMENT NOTE 09/23/2013  Patient:  [name], [name]   Account Number:  [id number]  Date Initiated:  09/23/2013  Documentation initiated by:  Vladimir Creeks  Subjective/Objective Assessment:   pt with Hv CA admitted with G.I. bleed, from a new rectal mass-Stage IV adenocarcinoma of the rectum.  Pt is from home, with spouse and plans to return home at D/C     Action/Plan:   Pt for cysto tomorrow, receiving blood today- 2 units   Anticipated DC Date:  09/25/2013   Anticipated DC Plan:  [place]  CM consult      Choice offered to / List presented to:             Status of service:  In process, will continue to follow Medicare Important Message given?   (If response is "NO", the following Medicare IM given date fields will be blank) Date Medicare IM given:   Date Additional Medicare IM given:    Discharge Disposition:    Per UR Regulation:  Reviewed for med. necessity/level of care/duration of stay  If discussed at [place] of Stay Meetings, dates discussed:    Comments:  09/23/13 [place] RN/CM

## 2013-09-23 NOTE — Progress Notes (Signed)
REVIEWED.  

## 2013-09-23 NOTE — Progress Notes (Signed)
[facility] PROGRESS NOTE  CARRSON LIGHTCAP ION:[id number] DOB: 16-Feb-1951 DOA: 09/20/2013 PCP: [name], MD  Assessment/Plan: 1. GI bleeding from rectal mass. Patient has a history of stage IV colorectal cancer and had undergone surgery/radiotherapy as well as chemotherapy. He currently has a ileostomy in his right lower quadrant. It appears that the has a recurrent rectal mass which began to bleed. Status post flexible sigmoidoscopy with results as below. Bleeding appears to be slowing. May benefit from further radiation based on input from radiation oncology. May need interventional radiology intervention if bleeding recurs. We'll continue to monitor for now. Follow up biopsies. 2. Acute blood loss anemia, on chronic disease. We will continue to follow hemoglobin. Hemoglobin has trended down to 7.9 today. He has been ordered 2 units PRBC per oncology. 3. 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 Although his bleeding appears to be improving and he is clinically stable, with the need for possible surgical intervention, he will receive 1 unit of platelets since his platelet count is only 45,000. 4. Acute renal failure. The patient likely has chronic renal disease stage III. His creatinine does appear to be above baseline. He does not appear to be particularly volume depleted. His worsening renal function may be related to his significant anemia. Renal ultrasound indicated left-sided hydronephrosis. He has been seen by urology and plans are to pursue cystoscopy. Continue with IV fluids and follow. Monitor urine output. 5. Diabetes. Continue sliding scale insulin. 6. Medical clearance. Once patient has received PRBCs and platelets, I feel it would be reasonable to proceed with any procedures.  Code Status: full code Family Communication: discussed with patient and family Disposition Plan: discharge home once  improved   Consultants: Gastroenterology Urology Oncology  Procedures: Flex sig: RECTAL BLEEDING DUE TO Near circumferential mass in the rectum    Antibiotics:    HPI/Subjective: No new complaints.  Objective: Filed Vitals:   09/23/13 1900  BP: 152/72  Pulse: 94  Temp: 98.2 F (36.8 C)  Resp: 20    Intake/Output Summary (Last 24 hours) at 09/23/13 1918 Last data filed at 09/23/13 1823  Gross per 24 hour  Intake 3328.33 ml  Output      0 ml  Net 3328.33 ml   Filed Weights   09/20/13 1238 09/20/13 1651  Weight: 102.059 kg (225 lb) 96.7 kg (213 lb 3 oz)    Exam:   General:  NAD  Cardiovascular: S1, S2 RRR  Respiratory: CTA B  Abdomen: soft, nt, ileostomy in RLQ  Musculoskeletal: no edema b/l   Data Reviewed: Basic Metabolic Panel:  Recent Labs Lab 09/20/13 1305 09/21/13 0603 09/22/13 0618 09/23/13 0444  NA 143 144 143 146  K 3.5* 3.7 3.8 3.7  CL 108 111 111 113*  CO2 23 23 23 22   GLUCOSE 97 77 95 79  BUN 14 12 12 11   CREATININE 2.17* 2.13* 2.22* 2.33*  CALCIUM 8.9 8.7 8.5 8.5   Liver Function Tests:  Recent Labs Lab 09/20/13 1305 09/21/13 0603  AST 30 25  ALT 15 14  ALKPHOS 127* 115  BILITOT 1.2 1.2  PROT 6.3 6.1  ALBUMIN 3.2* 2.9*   No results found for this basename: LIPASE, AMYLASE,  in the last 168 hours No results found for this basename: AMMONIA,  in the last 168 hours CBC:  Recent Labs Lab 09/20/13 1305 09/21/13 0603 09/21/13 1901 09/22/13 0618 09/23/13 0444  WBC 3.9* 3.2* 3.7* 3.3* 3.0*  NEUTROABS 3.1  --   --   --   --  HGB 9.3* 8.2* 8.3* 8.1* 7.9*  HCT 28.8* 25.5* 25.7* 24.8* 24.4*  MCV 85.7 87.0 86.2 86.4 86.2  PLT 41* 46* 41* 47* 45*   Cardiac Enzymes: No results found for this basename: CKTOTAL, CKMB, CKMBINDEX, TROPONINI,  in the last 168 hours BNP (last 3 results) No results found for this basename: PROBNP,  in the last 8760 hours CBG:  Recent Labs Lab 09/22/13 1118 09/22/13 1637  09/22/13 2141 09/23/13 0739 09/23/13 1707  GLUCAP 105* 87 143* 75 84    Recent Results (from the past 240 hour(s))  SURGICAL PCR SCREEN     Status: None   Collection Time    09/22/13 10:00 PM      Result Value Ref Range Status   MRSA, PCR NEGATIVE  NEGATIVE Final   Staphylococcus aureus NEGATIVE  NEGATIVE Final   Comment:            The Xpert SA Assay (FDA     approved for NASAL specimens     in patients over 50 years of age),     is one component of     a comprehensive surveillance     program.  Test performance has     been validated by [employer] for patients greater     than or equal to 14 year old.     It is not intended     to diagnose infection nor to     guide or monitor treatment.     Studies: No results found.  Scheduled Meds: . insulin aspart  0-5 Units Subcutaneous QHS  . insulin aspart  0-9 Units Subcutaneous TID WC  . pantoprazole  40 mg Oral Daily   Continuous Infusions: . sodium chloride 100 mL/hr at 09/23/13 0309    Active Problems:   DIABETES MELLITUS, TYPE II   Adenocarcinoma of rectum   Pancytopenia, acquired   Thrombocytopenia   Rectal bleeding   ARF (acute renal failure)   Hydronephrosis    Time spent: 43mins    Jehanzeb Memon  [facility] Pager [phone]. If 7PM-7AM, please contact night-coverage at [email or web address], password [facility] 09/23/2013, 7:18 PM  LOS: 3 days

## 2013-09-24 ENCOUNTER — Encounter (HOSPITAL_COMMUNITY): Payer: Self-pay | Admitting: Gastroenterology

## 2013-09-24 ENCOUNTER — Inpatient Hospital Stay (HOSPITAL_COMMUNITY): Payer: Managed Care, Other (non HMO)

## 2013-09-24 ENCOUNTER — Encounter (HOSPITAL_COMMUNITY)
Admission: RE | Disposition: A | Discharge status: Home / Self Care | Payer: Self-pay | Source: Ambulatory Visit | Attending: Internal Medicine

## 2013-09-24 ENCOUNTER — Encounter (HOSPITAL_COMMUNITY): Payer: Managed Care, Other (non HMO) | Admitting: Anesthesiology

## 2013-09-24 ENCOUNTER — Inpatient Hospital Stay (HOSPITAL_COMMUNITY): Payer: Managed Care, Other (non HMO) | Admitting: Anesthesiology

## 2013-09-24 DIAGNOSIS — N1330 Unspecified hydronephrosis: Secondary | ICD-10-CM

## 2013-09-24 HISTORY — PX: CYSTOSCOPY W/ URETERAL STENT PLACEMENT: SHX1429

## 2013-09-24 HISTORY — DX: Unspecified hydronephrosis: N13.30

## 2013-09-24 LAB — TYPE AND SCREEN
ABO/RH(D): A POS
Antibody Screen: NEGATIVE
UNIT DIVISION: 0
UNIT DIVISION: 0
Unit division: 0
Unit division: 0

## 2013-09-24 LAB — BASIC METABOLIC PANEL
BUN: 11 mg/dL (ref 6–23)
CALCIUM: 8.6 mg/dL (ref 8.4–10.5)
CO2: 21 meq/L (ref 19–32)
Chloride: 113 mEq/L — ABNORMAL HIGH (ref 96–112)
Creatinine, Ser: 2.13 mg/dL — ABNORMAL HIGH (ref 0.50–1.35)
GFR calc Af Amer: 37 mL/min — ABNORMAL LOW (ref >90)
GFR calc non Af Amer: 32 mL/min — ABNORMAL LOW (ref >90)
GLUCOSE: 100 mg/dL — AB (ref 70–99)
Potassium: 3.6 mEq/L — ABNORMAL LOW (ref 3.7–5.3)
SODIUM: 145 meq/L (ref 137–147)

## 2013-09-24 LAB — GLUCOSE, CAPILLARY
GLUCOSE-CAPILLARY: 95 mg/dL (ref 70–99)
GLUCOSE-CAPILLARY: 75 mg/dL (ref 70–99)
GLUCOSE-CAPILLARY: 79 mg/dL (ref 70–99)
GLUCOSE-CAPILLARY: 74 mg/dL (ref 70–99)
Glucose-Capillary: 67 mg/dL — ABNORMAL LOW (ref 70–99)
Glucose-Capillary: 139 mg/dL — ABNORMAL HIGH (ref 70–99)
Glucose-Capillary: 75 mg/dL (ref 70–99)
Glucose-Capillary: 73 mg/dL (ref 70–99)
Glucose-Capillary: 96 mg/dL (ref 70–99)

## 2013-09-24 LAB — CBC
HCT: 29.7 % — ABNORMAL LOW (ref 39.0–52.0)
Hemoglobin: 9.7 g/dL — ABNORMAL LOW (ref 13.0–17.0)
MCH: 27.7 pg (ref 26.0–34.0)
MCHC: 32.7 g/dL (ref 30.0–36.0)
MCV: 84.9 fL (ref 78.0–100.0)
Platelets: 45 10*3/uL — ABNORMAL LOW (ref 150–400)
RBC: 3.5 MIL/uL — AB (ref 4.22–5.81)
RDW: 22 % — ABNORMAL HIGH (ref 11.5–15.5)
WBC: 3.5 10*3/uL — ABNORMAL LOW (ref 4.0–10.5)

## 2013-09-24 LAB — PREPARE PLATELET PHERESIS: Unit division: 0

## 2013-09-24 SURGERY — CYSTOSCOPY, WITH RETROGRADE PYELOGRAM AND URETERAL STENT INSERTION
Anesthesia: General | Laterality: Left

## 2013-09-24 MED ORDER — PROPOFOL 10 MG/ML IV BOLUS
INTRAVENOUS | Status: AC
  Filled 2013-09-24: qty 20

## 2013-09-24 MED ORDER — FENTANYL CITRATE 0.05 MG/ML IJ SOLN
INTRAMUSCULAR | Status: AC
  Filled 2013-09-24: qty 2

## 2013-09-24 MED ORDER — DEXTROSE 50 % IV SOLN
25.0000 mL | Freq: Once | INTRAVENOUS | Status: AC | PRN
  Administered 2013-09-24: 25 mL via INTRAVENOUS

## 2013-09-24 MED ORDER — MIDAZOLAM HCL 2 MG/2ML IJ SOLN
1.0000 mg | INTRAMUSCULAR | Status: DC | PRN
  Administered 2013-09-24: 2 mg via INTRAVENOUS

## 2013-09-24 MED ORDER — LIDOCAINE HCL (PF) 1 % IJ SOLN
INTRAMUSCULAR | Status: AC
  Filled 2013-09-24: qty 5

## 2013-09-24 MED ORDER — FENTANYL CITRATE 0.05 MG/ML IJ SOLN
25.0000 ug | INTRAMUSCULAR | Status: DC | PRN
  Administered 2013-09-24 (×2): 50 ug via INTRAVENOUS
  Filled 2013-09-24: qty 2

## 2013-09-24 MED ORDER — HYDROMORPHONE HCL PF 1 MG/ML IJ SOLN
1.0000 mg | INTRAMUSCULAR | Status: DC | PRN
  Administered 2013-09-25 (×3): 1 mg via INTRAVENOUS
  Filled 2013-09-24 (×5): qty 1

## 2013-09-24 MED ORDER — ONDANSETRON HCL 4 MG/2ML IJ SOLN
4.0000 mg | Freq: Once | INTRAMUSCULAR | Status: AC
Start: 2013-09-24 — End: 2013-09-24
  Administered 2013-09-24: 4 mg via INTRAVENOUS

## 2013-09-24 MED ORDER — ONDANSETRON HCL 4 MG/2ML IJ SOLN
INTRAMUSCULAR | Status: AC
  Filled 2013-09-24: qty 2

## 2013-09-24 MED ORDER — SODIUM CHLORIDE 0.9 % IV SOLN: INTRAVENOUS | Status: DC

## 2013-09-24 MED ORDER — GLYCOPYRROLATE 0.2 MG/ML IJ SOLN
0.2000 mg | Freq: Once | INTRAMUSCULAR | Status: AC
  Administered 2013-09-24: 0.2 mg via INTRAVENOUS

## 2013-09-24 MED ORDER — STERILE WATER FOR IRRIGATION IR SOLN
Status: DC | PRN
Start: 2013-09-24 — End: 2013-09-24
  Administered 2013-09-24: 1000 mL

## 2013-09-24 MED ORDER — SODIUM CHLORIDE 0.9 % IV SOLN
INTRAVENOUS | Status: DC
  Administered 2013-09-24: 23:00:00 via INTRAVENOUS
  Administered 2013-09-25: 1000 mL via INTRAVENOUS

## 2013-09-24 MED ORDER — CIPROFLOXACIN IN D5W 400 MG/200ML IV SOLN
400.0000 mg | Freq: Once | INTRAVENOUS | Status: AC
  Administered 2013-09-24: 400 mg via INTRAVENOUS
  Filled 2013-09-24: qty 200

## 2013-09-24 MED ORDER — LIDOCAINE HCL (CARDIAC) 10 MG/ML IV SOLN
INTRAVENOUS | Status: DC | PRN
  Administered 2013-09-24: 20 mg via INTRAVENOUS

## 2013-09-24 MED ORDER — ONDANSETRON HCL 4 MG/2ML IJ SOLN: 4.0000 mg | Freq: Once | INTRAMUSCULAR | Status: DC | PRN

## 2013-09-24 MED ORDER — GLYCOPYRROLATE 0.2 MG/ML IJ SOLN
INTRAMUSCULAR | Status: AC
  Filled 2013-09-24: qty 1

## 2013-09-24 MED ORDER — DEXTROSE 50 % IV SOLN
12.5000 g | Freq: Once | INTRAVENOUS | Status: AC
  Administered 2013-09-24: 12.5 g via INTRAVENOUS

## 2013-09-24 MED ORDER — LACTATED RINGERS IV SOLN
INTRAVENOUS | Status: DC
  Administered 2013-09-24: 09:00:00 via INTRAVENOUS

## 2013-09-24 MED ORDER — FENTANYL CITRATE 0.05 MG/ML IJ SOLN
25.0000 ug | INTRAMUSCULAR | Status: AC
  Administered 2013-09-24 (×2): 25 ug via INTRAVENOUS

## 2013-09-24 MED ORDER — SODIUM CHLORIDE 0.45 % IV SOLN
INTRAVENOUS | Status: DC
  Administered 2013-09-24 – 2013-09-26 (×4): via INTRAVENOUS

## 2013-09-24 MED ORDER — IOHEXOL 350 MG/ML SOLN
INTRAVENOUS | Status: DC | PRN
  Administered 2013-09-24: 10 mL

## 2013-09-24 MED ORDER — SODIUM CHLORIDE 0.9 % IR SOLN
Status: DC | PRN
  Administered 2013-09-24 (×3): 3000 mL via INTRAVESICAL

## 2013-09-24 MED ORDER — HYDROMORPHONE HCL PF 1 MG/ML IJ SOLN
1.0000 mg | INTRAMUSCULAR | Status: DC | PRN
  Administered 2013-09-24 – 2013-09-28 (×13): 1 mg via INTRAVENOUS
  Filled 2013-09-24 (×12): qty 1

## 2013-09-24 MED ORDER — FLEET ENEMA 7-19 GM/118ML RE ENEM
1.0000 | ENEMA | Freq: Once | RECTAL | Status: AC
  Administered 2013-09-25: 1 via RECTAL

## 2013-09-24 MED ORDER — DEXTROSE 50 % IV SOLN
INTRAVENOUS | Status: AC
  Filled 2013-09-24: qty 50

## 2013-09-24 MED ORDER — ACETAMINOPHEN 325 MG PO TABS
650.0000 mg | ORAL_TABLET | Freq: Once | ORAL | Status: DC
  Filled 2013-09-24: qty 2

## 2013-09-24 MED ORDER — FENTANYL CITRATE 0.05 MG/ML IJ SOLN
INTRAMUSCULAR | Status: DC | PRN
  Administered 2013-09-24 (×2): 25 ug via INTRAVENOUS
  Administered 2013-09-24: 100 ug via INTRAVENOUS
  Administered 2013-09-24 (×4): 25 ug via INTRAVENOUS

## 2013-09-24 MED ORDER — MIDAZOLAM HCL 2 MG/2ML IJ SOLN
INTRAMUSCULAR | Status: AC
  Filled 2013-09-24: qty 2

## 2013-09-24 MED ORDER — DIPHENHYDRAMINE HCL 25 MG PO CAPS
25.0000 mg | ORAL_CAPSULE | Freq: Once | ORAL | Status: DC
  Filled 2013-09-24: qty 1

## 2013-09-24 MED ORDER — PROPOFOL 10 MG/ML IV BOLUS
INTRAVENOUS | Status: DC | PRN
  Administered 2013-09-24: 20 mg via INTRAVENOUS
  Administered 2013-09-24 (×2): 30 mg via INTRAVENOUS
  Administered 2013-09-24 (×4): 20 mg via INTRAVENOUS
  Administered 2013-09-24: 30 mg via INTRAVENOUS
  Administered 2013-09-24: 150 mg via INTRAVENOUS
  Administered 2013-09-24: 20 mg via INTRAVENOUS
  Administered 2013-09-24: 10 mg via INTRAVENOUS

## 2013-09-24 SURGICAL SUPPLY — 20 items
BAG DRAIN URO TABLE W/ADPT NS (DRAPE) ×3 IMPLANT
CATH 5 FR WEDGE TIP (UROLOGICAL SUPPLIES) ×3 IMPLANT
CATH OPEN TIP 5FR (CATHETERS) ×3 IMPLANT
CLOTH BEACON ORANGE TIMEOUT ST (SAFETY) ×3 IMPLANT
DECANTER SPIKE VIAL GLASS SM (MISCELLANEOUS) ×3 IMPLANT
DILATOR UROMAX ULTRA (MISCELLANEOUS) IMPLANT
GLOVE BIO SURGEON STRL SZ7 (GLOVE) ×3 IMPLANT
GLOVE BIOGEL PI IND STRL 7.0 (GLOVE) ×2 IMPLANT
GLOVE BIOGEL PI INDICATOR 7.0 (GLOVE) ×4
GLOVE ECLIPSE 6.5 STRL STRAW (GLOVE) ×3 IMPLANT
GOWN STRL REUS W/TWL LRG LVL3 (GOWN DISPOSABLE) ×3 IMPLANT
IV NS IRRIG 3000ML ARTHROMATIC (IV SOLUTION) ×9 IMPLANT
KIT ROOM TURNOVER AP CYSTO (KITS) ×3 IMPLANT
MANIFOLD NEPTUNE II (INSTRUMENTS) ×3 IMPLANT
PACK CYSTO (CUSTOM PROCEDURE TRAY) ×3 IMPLANT
PAD ARMBOARD 7.5X6 YLW CONV (MISCELLANEOUS) ×3 IMPLANT
SET IRRIGATING DISP (SET/KITS/TRAYS/PACK) ×3 IMPLANT
STENT PERCUFLEX 4.8FRX24 (STENTS) ×3 IMPLANT
TOWEL OR 17X26 4PK STRL BLUE (TOWEL DISPOSABLE) ×3 IMPLANT
WIRE GUIDE BENTSON .035 15CM (WIRE) ×6 IMPLANT

## 2013-09-24 SURGICAL SUPPLY — 18 items
BAG DRAIN URO TABLE W/ADPT NS (DRAPE) ×3 IMPLANT
CATH 5 FR WEDGE TIP (UROLOGICAL SUPPLIES) ×3 IMPLANT
CATH OPEN TIP 5FR (CATHETERS) ×3 IMPLANT
CLOTH BEACON ORANGE TIMEOUT ST (SAFETY) ×3 IMPLANT
DECANTER SPIKE VIAL GLASS SM (MISCELLANEOUS) ×3 IMPLANT
DILATOR UROMAX ULTRA (MISCELLANEOUS) IMPLANT
GLOVE BIO SURGEON STRL SZ7 (GLOVE) ×3 IMPLANT
GOWN STRL REUS W/TWL LRG LVL3 (GOWN DISPOSABLE) ×3 IMPLANT
IV NS IRRIG 3000ML ARTHROMATIC (IV SOLUTION) ×6 IMPLANT
KIT ROOM TURNOVER AP CYSTO (KITS) ×3 IMPLANT
MANIFOLD NEPTUNE II (INSTRUMENTS) ×3 IMPLANT
PACK CYSTO (CUSTOM PROCEDURE TRAY) ×3 IMPLANT
PAD ARMBOARD 7.5X6 YLW CONV (MISCELLANEOUS) ×3 IMPLANT
SET IRRIGATING DISP (SET/KITS/TRAYS/PACK) ×3 IMPLANT
STENT PERCUFLEX 4.8FRX24 (STENTS) IMPLANT
STONE RETRIEVAL GEMINI 2.4 FR (MISCELLANEOUS) IMPLANT
TOWEL OR 17X26 4PK STRL BLUE (TOWEL DISPOSABLE) ×3 IMPLANT
WIRE GUIDE BENTSON .035 15CM (WIRE) ×3 IMPLANT

## 2013-09-24 NOTE — Brief Op Note (Signed)
09/20/2013 - 09/24/2013  10:35 AM  PATIENT:  [name]  63 y.o. male  PRE-OPERATIVE DIAGNOSIS:  left hydronephrosis; cancer of colon recurrence causing obstruction left ureter  POST-OPERATIVE DIAGNOSIS:  left hydronephrosis; cancer of colon recurrence causing obstruction left ureter; small bladder calculi  PROCEDURE:  Procedure(s): CYSTOSCOPY WITH LEFT RETROGRADE PYELOGRAM; LEFT URETERAL STENT PLACEMENT; REMOVAL OF SMALL BLADDER CALCULI (Left)  SURGEON:  Surgeon(s) and Role:    * Marissa Nestle, MD - Primary  PHYSICIAN ASSISTANT:   ASSISTANTS: none   ANESTHESIA:   general  EBL:  Total I/O In: 893 [I.V.:700; Blood:193] Out: 0   BLOOD ADMINISTERED:none  DRAINS: double j stent size f4 24 cm no string.   LOCAL MEDICATIONS USED:  NONE  SPECIMEN:  No Specimen  DISPOSITION OF SPECIMEN:  PATHOLOGY  COUNTS:  YES  TOURNIQUET:  * No tourniquets in log *  DICTATION: .Other Dictation: Dictation Number dictation [id number]  PLAN OF CARE: Admit for overnight observation  PATIENT DISPOSITION:  PACU - hemodynamically stable.   Delay start of Pharmacological VTE agent (>24hrs) due to surgical blood loss or risk of bleeding:

## 2013-09-24 NOTE — Transfer of Care (Signed)
Immediate Anesthesia Transfer of Care Note  Patient: [name]  Procedure(s) Performed: Procedure(s) (LRB): CYSTOSCOPY WITH LEFT RETROGRADE PYELOGRAM; LEFT URETERAL STENT PLACEMENT; REMOVAL OF SMALL BLADDER CALCULI (Left)  Patient Location: PACU  Anesthesia Type: General  Level of Consciousness: awake  Airway & Oxygen Therapy: Patient Spontanous Breathing and non-rebreather face mask  Post-op Assessment: Report given to PACU RN, Post -op Vital signs reviewed and stable and Patient moving all extremities  Post vital signs: Reviewed and stable  Complications: No apparent anesthesia complications

## 2013-09-24 NOTE — Progress Notes (Signed)
Patient's  left chest port deaccessed due to the swelling at the site, gauze dressing applied over the site. Patient transferred to OR for procedure.

## 2013-09-24 NOTE — Anesthesia Procedure Notes (Signed)
Procedure Name: LMA Insertion Date/Time: 09/24/2013 9:41 AM Performed by: Vista Deck Pre-anesthesia Checklist: Patient identified, Patient being monitored, Emergency Drugs available, Timeout performed and Suction available Patient Re-evaluated:Patient Re-evaluated prior to inductionOxygen Delivery Method: Circle System Utilized Preoxygenation: Pre-oxygenation with 100% oxygen Intubation Type: IV induction Ventilation: Mask ventilation without difficulty LMA: LMA inserted LMA Size: 4.0 Number of attempts: 1 Placement Confirmation: positive ETCO2 and breath sounds checked- equal and bilateral Tube secured with: Tape

## 2013-09-24 NOTE — Anesthesia Preprocedure Evaluation (Addendum)
Anesthesia Evaluation  Patient identified by MRN, date of birth, ID band Patient awake    Reviewed: Allergy & Precautions, H&P , NPO status , Patient's Chart, lab work & pertinent test results, reviewed documented beta blocker date and time   Airway Mallampati: II TM Distance: >3 FB     Dental  (+) Poor Dentition, Missing, Loose   Pulmonary shortness of breath (mets to lung), sleep apnea , former smoker,  breath sounds clear to auscultation        Cardiovascular hypertension, Pt. on medications and Pt. on home beta blockers + CAD and + Past MI Rhythm:Regular Rate:Normal     Neuro/Psych    GI/Hepatic   Endo/Other  diabetes, Type 2  Renal/GU ARFRenal disease     Musculoskeletal   Abdominal   Peds  Hematology  (+) anemia ,   Anesthesia Other Findings   Reproductive/Obstetrics                        Anesthesia Physical Anesthesia Plan  ASA: IV  Anesthesia Plan: General   Post-op Pain Management:    Induction: Intravenous  Airway Management Planned: LMA  Additional Equipment:   Intra-op Plan:   Post-operative Plan: Extubation in OR  Informed Consent: I have reviewed the patients History and Physical, chart, labs and discussed the procedure including the risks, benefits and alternatives for the proposed anesthesia with the patient or authorized representative who has indicated his/her understanding and acceptance.   Dental advisory given  Plan Discussed with:   Anesthesia Plan Comments: (1/2 amp D50 for CBG=75 plts infusing now)        Anesthesia Quick Evaluation

## 2013-09-24 NOTE — OR Nursing (Signed)
Patient arrived to preop with small gauze which was soaked in red blood over port site.   Chest area swollen,

## 2013-09-24 NOTE — Anesthesia Postprocedure Evaluation (Signed)
Anesthesia Post Note  Patient: [name]  Procedure(s) Performed: Procedure(s) (LRB): CYSTOSCOPY WITH LEFT RETROGRADE PYELOGRAM; LEFT URETERAL STENT PLACEMENT; REMOVAL OF SMALL BLADDER CALCULI (Left)  Anesthesia type: General  Patient location: PACU  Post pain: Pain level controlled  Post assessment: Post-op Vital signs reviewed, Patient's Cardiovascular Status Stable, Respiratory Function Stable, Patent Airway, No signs of Nausea or vomiting and Pain level controlled  Last Vitals:  Filed Vitals:   09/24/13 1049  BP: 148/69  Pulse: 84  Temp: 37 C  Resp: 14    Post vital signs: Reviewed and stable  Level of consciousness: awake and alert   Complications: No apparent anesthesia complications

## 2013-09-24 NOTE — Progress Notes (Signed)
Small amt red drainage from meatus around catheter. Gauze applied to penis.

## 2013-09-24 NOTE — Progress Notes (Signed)
Subjective: Since I last evaluated the patient HAS HAD NO RECTAL BLEEDING. A FEW EPISODES OF RED TINGED RECTAL FLUID. S/P CYTOSCOPY/L URETERAL STENT PLACEMENT. C/O HA AND HURTING ALLOVER. HAS PRESSURE/PAIN IN GROIN.  Objective: Vital signs in last 24 hours: Temp:  [97 F (36.1 C)-98.8 F (37.1 C)] 97 F (36.1 C) (04/28 1230) Pulse Rate:  [63-94] 69 (04/28 1230) Resp:  [10-58] 18 (04/28 1230) BP: (140-166)/(64-87) 158/78 mmHg (04/28 1230) SpO2:  [93 %-100 %] 99 % (04/28 1230) Weight:  [215 lb (97.523 kg)-223 lb 1.7 oz (101.2 kg)] 223 lb 1.7 oz (101.2 kg) (04/28 0636) Last BM Date: 09/23/13  Intake/Output from previous day: 04/27 0701 - 04/28 0700 In: 3375 [P.O.:240; I.V.:2435; Blood:700] Out: -  Intake/Output this shift: Total I/O In: 1243 [I.V.:850; Blood:193; IV Piggyback:200] Out: 500 [Urine:500]  General appearance: alert, cooperative and mild distress Resp: clear to auscultation bilaterally Cardio: regular rate and rhythm GI: soft, MILD TO MOD tenderNESS IN BLQs AND SUPRAPUBIC REGION; bowel sounds normal; NO REBOUND OR GUARDING GU: BLOODY URINE, SML CLOTS IN FOLEY BAG  Lab Results:  Recent Labs  09/22/13 0618 09/23/13 0444 09/23/13 1948 09/24/13 0445  WBC 3.3* 3.0*  --  3.5*  HGB 8.1* 7.9* 9.6* 9.7*  HCT 24.8* 24.4* 29.0* 29.7*  PLT 47* 45*  --  45*   BMET  Recent Labs  09/22/13 0618 09/23/13 0444 09/24/13 0445  NA 143 146 145  K 3.8 3.7 3.6*  CL 111 113* 113*  CO2 23 22 21   GLUCOSE 95 79 100*  BUN 12 11 11   CREATININE 2.22* 2.33* 2.13*  CALCIUM 8.5 8.5 8.6   LFT No results found for this basename: PROT, ALBUMIN, AST, ALT, ALKPHOS, BILITOT, BILIDIR, IBILI,  in the last 72 hours PT/INR No results found for this basename: LABPROT, INR,  in the last 72 hours Hepatitis Panel No results found for this basename: HEPBSAG, HCVAB, HEPAIGM, HEPBIGM,  in the last 72 hours C-Diff No results found for this basename: CDIFFTOX,  in the last 72 hours Fecal  Lactopherrin No results found for this basename: FECLLACTOFRN,  in the last 72 hours  Studies/Results: Dg Retrograde Pyelogram  09/24/2013   CLINICAL DATA:  Left hydronephrosis, colon carcinoma.  EXAM: RETROGRADE PYELOGRAM  COMPARISON:  Ultrasound 09/21/2013 and earlier studies  FINDINGS: A series of images from retrograde pyelography demonstrates fixed irregular narrowing in the distal ureter at the level of the lower SI joint. The proximal ureter is distended. Later images document placement of a double-J ureteral stent, projecting in expected location.  IMPRESSION: Distal left ureteral obstruction, with double-J stent placement.   Electronically Signed   By: Arne Cleveland M.D.   On: 09/24/2013 12:30    Medications: I have reviewed the patient's current medications.  Assessment/Plan: ADMITTED WITH STAGE IV RECTAL CA. S/P L STENT PLACEMENT. DISCUSSED WITH DR. [name]. URETER APPEARS TO BE COMPRESSED. NO TUMOR INVOLVEMENT. DR. Saralyn Pilar CALLED ME & FINAL PATH SHOWS ADVANCED POLYP, NO CARCINOMA.  PLAN: 1. REPEAT SIGMOIDOSCOPY APR 28 WITH FLEETS ENEMA 1 HR PRIOR TO PROCEDURE. 2. NPO AFTER MN EXCEPT MEDS 3. DISCUSSED WITH PT, WIFE, AND FAMILY.    LOS: 4 days   Sandi L Fields 09/24/2013, 2:27 PM

## 2013-09-24 NOTE — Progress Notes (Signed)
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 He had undergone surgery/radiation therapy/chemotherapy. He currently has an ileostomy in his right lower quadrant. Patient presents to the hospital with rectal bleeding. He underwent flexible sigmoidoscopy on admission and was found to have a rectal mass. This was biopsied. He was admitted to the hospital for further observation of bleeding. Fortunately, bleeding appeared to have tapered off on its own. He was transfused a total of 2 units of PRBCs during his hospitalization. Biopsy results from the mass did not indicate any clear malignancy. Due to its appearance and high suspicion for malignancy, flexible sigmoidoscopy will be repeated in a.m. for repeat biopsy. The patient was also noted to be in acute renal failure. Renal ultrasound indicated left-sided hydronephrosis. This was likely related to external compression from his malignancy. He was seen by urology and underwent cystoscopy with stent placement. Labs will be repeated in the morning. Anticipate discharge in the next one to 2 days   Assessment/Plan: 1. GI bleeding from rectal mass. Patient has a history of stage IV colorectal cancer and had undergone surgery/radiotherapy as well as chemotherapy. He currently has a ileostomy in his right lower quadrant. It appears that the has a recurrent rectal mass which began to bleed. Patient has flexible sigmoidoscopy on admission with biopsy of the mass. He was followed by both GI and oncology during his hospitalization. Interestingly, biopsy results did not indicate any malignancy. Plans are to repeat sigmoidoscopy in a.m. Once he is discharged, he will need to follow up with radiation oncology for continued radiation mass to help with any  recurrent bleeding. 2. Acute blood loss anemia, on chronic disease. Patient was transfused 2 units of PRBCs. Posttransfusion hemoglobin appears to be improved. Continue to follow. 3. Thrombocytopenia, chronic, likely related to chemotherapy/radiation. Appears to be stable. Continue to follow 4. Acute renal failure. The patient likely has chronic renal disease stage III. His creatinine does appear to be above baseline. He does not appear to be particularly volume depleted. Renal ultrasound indicated left-sided hydronephrosis. He underwent cystoscopy today with left ureteral stent placement. We'll recheck renal function tomorrow. Continue with IV fluids and follow. Monitor urine output. 5. Diabetes. Continue sliding scale insulin. .  Code Status: full code Family Communication: discussed with patient and family Disposition Plan: discharge home once improved   Consultants: Gastroenterology Urology Oncology  Procedures: Flex sig: RECTAL BLEEDING DUE TO Near circumferential mass in the rectum  CYSTOSCOPY WITH LEFT RETROGRADE PYELOGRAM; LEFT URETERAL STENT PLACEMENT; REMOVAL OF SMALL BLADDER CALCULI (Left)     Antibiotics:    HPI/Subjective: Complains of pain in his groin area  Objective: Filed Vitals:   09/24/13 1841  BP: 134/63  Pulse: 70  Temp: 98.7 F (37.1 C)  Resp: 20    Intake/Output Summary (Last 24 hours) at 09/24/13 1922 Last data filed at 09/24/13 1757  Gross per 24 hour  Intake   3678 ml  Output   1400 ml  Net   2278 ml   Filed Weights   09/20/13 1651 09/23/13 2150 09/24/13 0636  Weight: 96.7 kg (213 lb 3 oz) 97.523 kg (215 lb) 101.2 kg (223 lb 1.7 oz)    Exam:   General:  NAD  Cardiovascular: S1, S2 RRR  Respiratory: CTA B  Abdomen: soft, nt, ileostomy in RLQ  Musculoskeletal: no edema b/l   Data Reviewed: Basic Metabolic Panel:  Recent Labs Lab 09/20/13 1305 09/21/13 0603 09/22/13 0618 09/23/13 0444 09/24/13 0445  NA 143 144 143 146  145  K 3.5* 3.7 3.8 3.7 3.6*  CL 108 111 111 113* 113*  CO2 23 23 23 22 21   GLUCOSE 97 77 95 79 100*  BUN 14 12 12 11 11   CREATININE 2.17* 2.13* 2.22* 2.33* 2.13*  CALCIUM 8.9 8.7 8.5 8.5 8.6   Liver Function Tests:  Recent Labs Lab 09/20/13 1305 09/21/13 0603  AST 30 25  ALT 15 14  ALKPHOS 127* 115  BILITOT 1.2 1.2  PROT 6.3 6.1  ALBUMIN 3.2* 2.9*   No results found for this basename: LIPASE, AMYLASE,  in the last 168 hours No results found for this basename: AMMONIA,  in the last 168 hours CBC:  Recent Labs Lab 09/20/13 1305 09/21/13 0603 09/21/13 1901 09/22/13 0618 09/23/13 0444 09/23/13 1948 09/24/13 0445  WBC 3.9* 3.2* 3.7* 3.3* 3.0*  --  3.5*  NEUTROABS 3.1  --   --   --   --   --   --   HGB 9.3* 8.2* 8.3* 8.1* 7.9* 9.6* 9.7*  HCT 28.8* 25.5* 25.7* 24.8* 24.4* 29.0* 29.7*  MCV 85.7 87.0 86.2 86.4 86.2  --  84.9  PLT 41* 46* 41* 47* 45*  --  45*   Cardiac Enzymes: No results found for this basename: CKTOTAL, CKMB, CKMBINDEX, TROPONINI,  in the last 168 hours BNP (last 3 results) No results found for this basename: PROBNP,  in the last 8760 hours CBG:  Recent Labs Lab 09/24/13 0854 09/24/13 0928 09/24/13 1059 09/24/13 1212 09/24/13 1813  GLUCAP 75 139* 79 73 96    Recent Results (from the past 240 hour(s))  SURGICAL PCR SCREEN     Status: None   Collection Time    09/22/13 10:00 PM      Result Value Ref Range Status   MRSA, PCR NEGATIVE  NEGATIVE Final   Staphylococcus aureus NEGATIVE  NEGATIVE Final   Comment:            The Xpert SA Assay (FDA     approved for NASAL specimens     in patients over 36 years of age),     is one component of     a comprehensive surveillance     program.  Test performance has     been validated by [employer] for patients greater     than or equal to 52 year old.     It is not intended     to diagnose infection nor to     guide or monitor treatment.     Studies: Dg Retrograde Pyelogram  09/24/2013    CLINICAL DATA:  Left hydronephrosis, colon carcinoma.  EXAM: RETROGRADE PYELOGRAM  COMPARISON:  Ultrasound 09/21/2013 and earlier studies  FINDINGS: A series of images from retrograde pyelography demonstrates fixed irregular narrowing in the distal ureter at the level of the lower SI joint. The proximal ureter is distended. Later images document placement of a double-J ureteral stent, projecting in expected location.  IMPRESSION: Distal left ureteral obstruction, with double-J stent placement.   Electronically Signed   By: Arne Cleveland M.D.   On: 09/24/2013 12:30    Scheduled Meds: . acetaminophen  650 mg Oral Once  . diphenhydrAMINE  25 mg Oral Once  . insulin aspart  0-5 Units Subcutaneous QHS  .  insulin aspart  0-9 Units Subcutaneous TID WC  . pantoprazole  40 mg Oral Daily  . [START ON 09/25/2013] sodium phosphate  1 enema Rectal Once   Continuous Infusions: . sodium chloride 100 mL/hr at 09/24/13 1230  . sodium chloride 100 mL/hr at 09/23/13 8483    Active Problems:   DIABETES MELLITUS, TYPE II   Adenocarcinoma of rectum   Pancytopenia, acquired   Thrombocytopenia   Rectal bleeding   ARF (acute renal failure)   Hydronephrosis   Hydronephrosis of left kidney    Time spent: 61mins    Jehanzeb Memon  [facility] Pager [phone]. If 7PM-7AM, please contact night-coverage at [email or web address], password [facility] 09/24/2013, 7:22 PM  LOS: 4 days

## 2013-09-25 ENCOUNTER — Encounter (HOSPITAL_COMMUNITY): Payer: Self-pay | Admitting: Anesthesiology

## 2013-09-25 ENCOUNTER — Encounter (HOSPITAL_COMMUNITY): Payer: Self-pay | Admitting: *Deleted

## 2013-09-25 ENCOUNTER — Encounter (HOSPITAL_COMMUNITY)
Admission: RE | Disposition: A | Discharge status: Home / Self Care | Payer: Self-pay | Source: Ambulatory Visit | Attending: Internal Medicine

## 2013-09-25 DIAGNOSIS — K922 Gastrointestinal hemorrhage, unspecified: Secondary | ICD-10-CM | POA: Diagnosis present

## 2013-09-25 DIAGNOSIS — R198 Other specified symptoms and signs involving the digestive system and abdomen: Secondary | ICD-10-CM

## 2013-09-25 HISTORY — PX: FLEXIBLE SIGMOIDOSCOPY: SHX5431

## 2013-09-25 LAB — BASIC METABOLIC PANEL
BUN: 9 mg/dL (ref 6–23)
CO2: 23 mEq/L (ref 19–32)
Calcium: 8.7 mg/dL (ref 8.4–10.5)
Chloride: 110 mEq/L (ref 96–112)
Creatinine, Ser: 2.01 mg/dL — ABNORMAL HIGH (ref 0.50–1.35)
GFR, EST AFRICAN AMERICAN: 40 mL/min — AB (ref >90)
GFR, EST NON AFRICAN AMERICAN: 34 mL/min — AB (ref >90)
Glucose, Bld: 83 mg/dL (ref 70–99)
POTASSIUM: 3.7 meq/L (ref 3.7–5.3)
SODIUM: 145 meq/L (ref 137–147)

## 2013-09-25 LAB — TYPE AND SCREEN
ABO/RH(D): A POS
ANTIBODY SCREEN: NEGATIVE
UNIT DIVISION: 0
Unit division: 0

## 2013-09-25 LAB — CBC
HCT: 30.6 % — ABNORMAL LOW (ref 39.0–52.0)
HCT: 30.4 % — ABNORMAL LOW (ref 39.0–52.0)
HEMOGLOBIN: 9.9 g/dL — AB (ref 13.0–17.0)
Hemoglobin: 10 g/dL — ABNORMAL LOW (ref 13.0–17.0)
MCH: 27.8 pg (ref 26.0–34.0)
MCH: 28.2 pg (ref 26.0–34.0)
MCHC: 32.4 g/dL (ref 30.0–36.0)
MCHC: 32.9 g/dL (ref 30.0–36.0)
MCV: 86 fL (ref 78.0–100.0)
MCV: 85.9 fL (ref 78.0–100.0)
PLATELETS: 45 10*3/uL — AB (ref 150–400)
Platelets: 56 10*3/uL — ABNORMAL LOW (ref 150–400)
RBC: 3.56 MIL/uL — ABNORMAL LOW (ref 4.22–5.81)
RBC: 3.54 MIL/uL — AB (ref 4.22–5.81)
RDW: 21.5 % — AB (ref 11.5–15.5)
RDW: 21.3 % — AB (ref 11.5–15.5)
WBC: 4.8 10*3/uL (ref 4.0–10.5)
WBC: 4.3 10*3/uL (ref 4.0–10.5)

## 2013-09-25 LAB — GLUCOSE, CAPILLARY
GLUCOSE-CAPILLARY: 105 mg/dL — AB (ref 70–99)
GLUCOSE-CAPILLARY: 81 mg/dL (ref 70–99)
GLUCOSE-CAPILLARY: 83 mg/dL (ref 70–99)
GLUCOSE-CAPILLARY: 86 mg/dL (ref 70–99)
GLUCOSE-CAPILLARY: 87 mg/dL (ref 70–99)
Glucose-Capillary: 91 mg/dL (ref 70–99)

## 2013-09-25 LAB — PREPARE PLATELET PHERESIS
UNIT DIVISION: 0
Unit division: 0
Unit division: 0

## 2013-09-25 SURGERY — SIGMOIDOSCOPY, FLEXIBLE
Anesthesia: Moderate Sedation

## 2013-09-25 MED ORDER — MIDAZOLAM HCL 5 MG/5ML IJ SOLN
INTRAMUSCULAR | Status: AC
  Filled 2013-09-25: qty 5

## 2013-09-25 MED ORDER — MEPERIDINE HCL 100 MG/ML IJ SOLN
INTRAMUSCULAR | Status: AC
  Filled 2013-09-25: qty 1

## 2013-09-25 MED ORDER — MIDAZOLAM HCL 5 MG/5ML IJ SOLN
INTRAMUSCULAR | Status: DC | PRN
  Administered 2013-09-25 (×2): 2 mg via INTRAVENOUS

## 2013-09-25 MED ORDER — ONDANSETRON HCL 4 MG/2ML IJ SOLN
INTRAMUSCULAR | Status: AC
  Filled 2013-09-25: qty 2

## 2013-09-25 MED ORDER — MEPERIDINE HCL 100 MG/ML IJ SOLN
INTRAMUSCULAR | Status: DC | PRN
  Administered 2013-09-25 (×2): 50 mg via INTRAVENOUS

## 2013-09-25 MED ORDER — FLEET ENEMA 7-19 GM/118ML RE ENEM
1.0000 | ENEMA | RECTAL | Status: AC
  Administered 2013-09-25: 1 via RECTAL

## 2013-09-25 MED ORDER — STERILE WATER FOR IRRIGATION IR SOLN
Status: DC | PRN
  Administered 2013-09-25: 09:00:00

## 2013-09-25 MED ORDER — ONDANSETRON HCL 4 MG/2ML IJ SOLN
INTRAMUSCULAR | Status: DC | PRN
  Administered 2013-09-25: 4 mg via INTRAVENOUS

## 2013-09-25 NOTE — OR Nursing (Signed)
Notified by Dr. [name] to send specimens from today's flexible sigmoidoscopy stat. Notified Lysle Morales in pathology.

## 2013-09-25 NOTE — Op Note (Signed)
NAME:  [name], [name]                    ACCOUNT NO.:  MEDICAL RECORD NO.:  [id number]  LOCATION:                                 FACILITY:  PHYSICIAN:  R. Garfield Cornea, MD FACP FACGDATE OF BIRTH:  [birth year]  DATE OF PROCEDURE: DATE OF DISCHARGE:                              OPERATIVE REPORT   PROCEDURE:  Proctoscopy/sigmoidoscopy with biopsy.  INDICATIONS FOR PROCEDURE:  A 63 year old gentleman with stage IV rectal cancer, who presents with rectal mass and hematochezia.  Sigmoidoscopy with biopsy a few days ago was done, but suspicious for carcinoma.  Sigmoidoscopy is now being done to obtain additional tissue to establish the diagnosis.  Risks, benefits, limitations, alternatives, imponderables have been discussed, questions answered, all parties agreeable.  PROCEDURE NOTE:  The patient was placed in left lateral decubitus position, O2 saturation, blood pressure, pulse, and respirations were monitored throughout the entire procedure.  CONSCIOUS SEDATION:  Versed 4 mg IV, Demerol 100 mg IV in divided doses and Zofran 4 mg IV.  INSTRUMENT:  Pentax video chip colonoscope.  FINDINGS:  Digital rectal exam reveals somewhat tight sphincter tone. Endoscopic findings, rectal pouch was approximately 10-12 cm in length. The patient had a fungating semilunar exophytic neoplastic appearing process extending from the proximal rectal stump all the way down to the anal verge.  It was in the semilunar distribution.  Please see photos. The tissues were markedly friable.  Multiple biopsies were taken for the pathologist, at least 6.  The patient probably lost 10-15 mL of blood acutely with these maneuvers, but otherwise tolerated the procedure well.  IMPRESSION:  Fungating exophytic rectal mass most likely representing recurrent adenocarcinoma status post biopsy.  He had a platelet count 45,000, 2 days ago.  He was given 1 pack of Platelets for his urological procedures yesterday, he  has not had a followup CBC.  He has additional platelets on hold in the blood bank which expir at midnight tonight.  We will see where he stands with the CBC; at this point, we will decide about giving him additional platelets.  Follow-up up on pathology.     Bridgette Habermann, MD Quentin Ore     RMR/MEDQ  D:  09/25/2013  T:  09/25/2013  Job:  [id number]

## 2013-09-25 NOTE — Progress Notes (Signed)
Doing fine temp 97.3 bp 150/75pulse 82/ min wbc 4.3 hct 30.4

## 2013-09-25 NOTE — Anesthesia Postprocedure Evaluation (Signed)
  Anesthesia Post-op Note  Patient: [name]  Procedure(s) Performed: Procedure(s): FLEXIBLE SIGMOIDOSCOPY (N/A)  Patient Location: Nursing Unit  Anesthesia Type:General  Level of Consciousness: awake, alert  and oriented  Airway and Oxygen Therapy: Patient Spontanous Breathing  Post-op Pain: none  Post-op Assessment: Post-op Vital signs reviewed, Patient's Cardiovascular Status Stable, Respiratory Function Stable, Patent Airway and No signs of Nausea or vomiting  Post-op Vital Signs: Reviewed and stable  Last Vitals:  Filed Vitals:   09/25/13 1003  BP: 150/75  Pulse: 82  Temp: 36.3 C  Resp: 20    Complications: No apparent anesthesia complications

## 2013-09-25 NOTE — Progress Notes (Signed)
Patient's platelet count stable at 45,000. Received a packet of pheresis platelets yesterday. No more bleeding than expected after biopsies this morning. Therefore, will hold off on further platelet transfusions for the time being.

## 2013-09-25 NOTE — Progress Notes (Signed)
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 He had undergone surgery/radiation therapy/chemotherapy. He currently has an ileostomy in his right lower quadrant. Patient presents to the hospital with rectal bleeding. He underwent flexible sigmoidoscopy on admission and was found to have a rectal mass. This was biopsied. He was admitted to the hospital for further observation of bleeding. Fortunately, bleeding appeared to have tapered off on its own. He was transfused a total of 2 units of PRBCs during his hospitalization. Biopsy results from the mass did not indicate any clear malignancy. Due to its appearance and high suspicion for malignancy, flexible sigmoidoscopy was repeated on 4/29 for repeat biopsy. The patient was also noted to be in acute renal failure. Renal ultrasound indicated left-sided hydronephrosis. This was likely related to external compression from his malignancy. He was seen by urology and underwent cystoscopy with stent placement on 4/28.   Assessment/Plan: 1. GI bleeding from rectal mass. Patient has a history of stage IV colorectal cancer and had undergone surgery/radiotherapy as well as chemotherapy. He currently has a ileostomy in his right lower quadrant. It appears that the has a recurrent rectal mass which began to bleed. Patient has flexible sigmoidoscopy on admission with biopsy of the mass. He was followed by both GI and oncology during his hospitalization. Interestingly, biopsy results did not indicate any malignancy. Proctoscopy/sigmoidoscopy with biopsies performed today 4/29 by Dr. Gala Romney. Once he is discharged, he will need to follow up with radiation oncology for continued radiation to the mass to help with any recurrent bleeding. 2. Acute blood loss anemia,  on chronic disease. Patient was transfused 2 units of PRBCs. Posttransfusion hemoglobin appears to be improved. Continue to follow. 3. Thrombocytopenia, chronic, likely related to chemotherapy/radiation. Status post platelet transfusion on 4/28. Appears to be stable. Continue to follow 4. Acute renal failure. The patient likely has chronic renal disease stage III. His creatinine does appear to be above baseline. He does not appear to be particularly volume depleted. Renal ultrasound indicated left-sided hydronephrosis. He underwent cystoscopy 4/28 with left ureteral stent placement. His renal function improved minimally. Continue with IV fluids and follow. Monitor urine output. 5. Left hydronephrosis. Management as per #4. Will ask urology about the indwelling Foley catheter: keep it in or discontinue it prior to discharge. 6. Diabetes. Stable and controlled. Continue sliding scale insulin. .  Code Status: full code Family Communication: discussed with patient and wife. Disposition Plan: discharge home once improved   Consultants: Gastroenterology Urology Oncology  Procedures:  4/29:Multiple biopsies were taken for the pathologist, at least 6. The  patient probably lost 10-15 mL of blood acutely with these maneuvers,  but otherwise tolerated the procedure well.  IMPRESSION: Fungating exophytic rectal mass most likely representing  recurrent adenocarcinoma status post biopsy.  4/28: CYSTOSCOPY WITH LEFT RETROGRADE PYELOGRAM; LEFT URETERAL STENT PLACEMENT; REMOVAL OF SMALL BLADDER CALCULI (Left)  Flex sig: RECTAL BLEEDING DUE TO Near circumferential mass in the rectum  Antibiotics:    HPI/Subjective: Complains of pain in and around the Foley catheter and occasional rectal pain. No active rectal bleeding currently.  Objective: Filed Vitals:   09/25/13 1003  BP: 150/75  Pulse: 82  Temp: 97.3 F (36.3 C)  Resp: 20   oxygen saturation 98%.  Intake/Output Summary (Last 24  hours) at 09/25/13 1459 Last data filed  at 09/25/13 0900  Gross per 24 hour  Intake      0 ml  Output   1450 ml  Net  -1450 ml   Filed Weights   09/20/13 1651 09/23/13 2150 09/24/13 0636  Weight: 96.7 kg (213 lb 3 oz) 97.523 kg (215 lb) 101.2 kg (223 lb 1.7 oz)    Exam:   General:  NAD  Cardiovascular: S1, S2, no murmurs rubs or gallops.  Respiratory: CTA B  Abdomen: soft, nt, ileostomy in RLQ with semisolid stool.  GU: Indwelling Foley catheter draining bloody urine with occasional clots.  Musculoskeletal: no edema b/l   Neurologic/psychiatric: He is alert and oriented x3. Cranial nerves II through XII are intact.  Data Reviewed: Basic Metabolic Panel:  Recent Labs Lab 09/21/13 0603 09/22/13 0618 09/23/13 0444 09/24/13 0445 09/25/13 0515  NA 144 143 146 145 145  K 3.7 3.8 3.7 3.6* 3.7  CL 111 111 113* 113* 110  CO2 23 23 22 21 23   GLUCOSE 77 95 79 100* 83  BUN 12 12 11 11 9   CREATININE 2.13* 2.22* 2.33* 2.13* 2.01*  CALCIUM 8.7 8.5 8.5 8.6 8.7   Liver Function Tests:  Recent Labs Lab 09/20/13 1305 09/21/13 0603  AST 30 25  ALT 15 14  ALKPHOS 127* 115  BILITOT 1.2 1.2  PROT 6.3 6.1  ALBUMIN 3.2* 2.9*   No results found for this basename: LIPASE, AMYLASE,  in the last 168 hours No results found for this basename: AMMONIA,  in the last 168 hours CBC:  Recent Labs Lab 09/20/13 1305  09/22/13 0618 09/23/13 0444 09/23/13 1948 09/24/13 0445 09/25/13 0515 09/25/13 0940  WBC 3.9*  < > 3.3* 3.0*  --  3.5* 4.8 4.3  NEUTROABS 3.1  --   --   --   --   --   --   --   HGB 9.3*  < > 8.1* 7.9* 9.6* 9.7* 9.9* 10.0*  HCT 28.8*  < > 24.8* 24.4* 29.0* 29.7* 30.6* 30.4*  MCV 85.7  < > 86.4 86.2  --  84.9 86.0 85.9  PLT 41*  < > 47* 45*  --  45* 56* 45*  < > = values in this interval not displayed. Cardiac Enzymes: No results found for this basename: CKTOTAL, CKMB, CKMBINDEX, TROPONINI,  in the last 168 hours BNP (last 3 results) No results found for this  basename: PROBNP,  in the last 8760 hours CBG:  Recent Labs Lab 09/24/13 2036 09/25/13 0015 09/25/13 0429 09/25/13 0721 09/25/13 1217  GLUCAP 74 81 87 83 105*    Recent Results (from the past 240 hour(s))  SURGICAL PCR SCREEN     Status: None   Collection Time    09/22/13 10:00 PM      Result Value Ref Range Status   MRSA, PCR NEGATIVE  NEGATIVE Final   Staphylococcus aureus NEGATIVE  NEGATIVE Final   Comment:            The Xpert SA Assay (FDA     approved for NASAL specimens     in patients over 72 years of age),     is one component of     a comprehensive surveillance     program.  Test performance has     been validated by [employer] for patients greater     than or equal to 70 year old.     It is not intended     to  diagnose infection nor to     guide or monitor treatment.     Studies: Dg Retrograde Pyelogram  09/24/2013   CLINICAL DATA:  Left hydronephrosis, colon carcinoma.  EXAM: RETROGRADE PYELOGRAM  COMPARISON:  Ultrasound 09/21/2013 and earlier studies  FINDINGS: A series of images from retrograde pyelography demonstrates fixed irregular narrowing in the distal ureter at the level of the lower SI joint. The proximal ureter is distended. Later images document placement of a double-J ureteral stent, projecting in expected location.  IMPRESSION: Distal left ureteral obstruction, with double-J stent placement.   Electronically Signed   By: Arne Cleveland M.D.   On: 09/24/2013 12:30    Scheduled Meds: . acetaminophen  650 mg Oral Once  . diphenhydrAMINE  25 mg Oral Once  . insulin aspart  0-5 Units Subcutaneous QHS  . insulin aspart  0-9 Units Subcutaneous TID WC  . meperidine      . midazolam      . ondansetron      . pantoprazole  40 mg Oral Daily   Continuous Infusions: . sodium chloride 100 mL/hr at 09/25/13 1329  . sodium chloride 100 mL/hr at 09/23/13 6734    Active Problems:   DIABETES MELLITUS, TYPE II   Adenocarcinoma of rectum    Pancytopenia, acquired   Thrombocytopenia   Rectal bleeding   ARF (acute renal failure)   Hydronephrosis   Hydronephrosis of left kidney   GI bleeding    Time spent: 49mins    Shenequa Howse  [facility] Pager [phone]. If 7PM-7AM, please contact night-coverage at [email or web address], password [facility] 09/25/2013, 2:59 PM  LOS: 5 days

## 2013-09-25 NOTE — Progress Notes (Signed)
Rectal mass biopsies nondiagnostic as outlined in the record. Sigmoidoscopy today with additional tissue sampling. Discussed with patient risks, benefits, limitations alternatives. The patient's questions answered. Agreeable. Platelet count 45,00 2 days ago. Given a packet of random donor platelets yesterday;  No followup hematology lab since that time. He has not had any further bleeding. Will proceed with a sigmoidoscopy with  biopsies this morning; he has additional platelets on hold in the blood bank which expired midnight. We'll see how he does with biopsies today and repeat a CBC and make a decision about further platelet transfusion in the very near future.

## 2013-09-25 NOTE — Consult Note (Signed)
NAME:  Heinke, Aristidis                    ACCOUNT NO.:  MEDICAL RECORD NO.:  LOCATION:                                 FACILITY:  PHYSICIAN:  Marissa Nestle, M.D.    DATE OF BIRTH:  DATE OF CONSULTATION:  09/22/2013 DATE OF DISCHARGE:                                CONSULTATION   HISTORY OF PRESENT ILLNESS:  Mr. Fluharty is a 63 year old gentleman who is admitted primarily in the hospital with rectal bleeding.  He was having profuse bleeding from the rectum.  Had a sigmoidoscopy done and it shows there is a fungating mass, which is the recurrent carcinoma of the rectum and several biopsies were done by Dr. [name].  The patient is admitted for further management and control of pain.  He is also having pain in the left upper quadrant and CT scan was done, it shows he has stone in the left kidney causing no obstruction, but there is hydronephrosis of the renal pelvis and the ureter is dilated down to the pelvic mass.  It looks like the mass is recurrent carcinoma of the rectum is causing obstruction of the left ureter at that point.  PHYSICAL EXAMINATION:  GENERAL:  He is fully conscious, alert, oriented, not in acute distress. ABDOMEN:  Soft, flat.  Liver, spleen, kidneys are not palpable.  There is 1+ left  CVA tenderness.  External genitalia is normal. RECTAL:  Examination is deferred. EXTREMITIES:  Normal.  IMPRESSION:  Recurrent cancer of the rectum.  He underwent left colectomy in 2008, followed with radiotherapy and chemotherapy.  The liver shows normal.  There is no mets in the liver.  He told me he had lithotripsy done for left renal calculus in the past and he initially thought he was having pain in the left side, left flank.  He thought he is having kidney colic, but it turned out to be a hydronephrotic kidney with obstruction.  The plan to do radiotherapy but he needs to have drainage of the kidney so I am going to go ahead and put a stent on that side.  The patient  is familiar and I explained to him about the stent.  IMPRESSION:  Left hydronephrosis with recurrent rectal cancer status post left hemicolectomy and radiotherapy and chemotherapy.  Left renal calculi and recommend plan cysto left retrograde pyelogram, insertion of double-J stent under anesthesia.     Marissa Nestle, M.D.     MIJ/MEDQ  D:  09/23/2013  T:  09/24/2013  Job:  [id number]

## 2013-09-25 NOTE — Op Note (Signed)
[name], [name]               ACCOUNT NO.:  [id number]  MEDICAL RECORD NO.:  [id number]  LOCATION:                                 FACILITY:  PHYSICIAN:  Marissa Nestle, M.D.DATE OF BIRTH:  03-24-1951  DATE OF PROCEDURE:  09/24/2013 DATE OF DISCHARGE:                              OPERATIVE REPORT   PREOPERATIVE DIAGNOSIS:  Left hydronephrosis.  POSTOPERATIVE DIAGNOSES:  Left hydronephrosis and bladder calculi.  PROCEDURES:  Cystoscopy, left retrograde pyelogram, insertion of double- J stent, size 5-French, no string attached.  ANESTHESIA:  General endotracheal.  DESCRIPTION OF PROCEDURE:  The patient was under general endotracheal anesthesia in lithotomy position.  After usual prep and drape, a #25 cystoscope introduced into the bladder.  It was inspected.  He was oozing blood from the prostatic urethra, but there was no tumor in the bladder.  He had small bladder calculi floating around in the bladder. They were subsequently just simply evacuated, were sucked out from the bladder.  Left ureteral orifice with some difficulty was identified.  A wedge catheter was introduced into the left ureteral orifice and Hypaque was injected.  The dye goes up into the ureter at the junction of distal third and mid ureter.  It was markedly dilated.  Once the ureter was identified, I was able to put open-ended catheter over the guidewire in it.  I advanced the guidewire with some difficulty.  The guidewire and open-ended catheter went into the upper ureter and the renal pelvis, and after removing the guidewire, the Hypaque was injected through the open- ended catheter.  It outlined the collecting system.  Now, I was sure I am in the collecting system.  The guidewire was reintroduced and the open-ended catheter was removed.  Over the guidewire, I advanced a 5- French 24 cm length of double-J stent without string.  It was positioned into the renal pelvis and the bladder.  Nice loop was  obtained in the renal pelvis.  After removing the guidewire, a nice loop was obtained in the bladder also.  I decided to leave a 16 Foley catheter in the bladder, a #16 Foley catheter was inserted into the bladder.  It is draining slightly pinkish urine.  All the instruments have been removed. The patient left the operating room in satisfactory condition.     Marissa Nestle, M.D.     MIJ/MEDQ  D:  09/24/2013  T:  09/25/2013  Job:  [id number]

## 2013-09-26 ENCOUNTER — Inpatient Hospital Stay (HOSPITAL_COMMUNITY): Payer: Managed Care, Other (non HMO)

## 2013-09-26 ENCOUNTER — Encounter: Payer: Self-pay | Admitting: Radiation Oncology

## 2013-09-26 DIAGNOSIS — C7800 Secondary malignant neoplasm of unspecified lung: Secondary | ICD-10-CM

## 2013-09-26 DIAGNOSIS — C787 Secondary malignant neoplasm of liver and intrahepatic bile duct: Secondary | ICD-10-CM

## 2013-09-26 LAB — BASIC METABOLIC PANEL
BUN: 9 mg/dL (ref 6–23)
CO2: 24 mEq/L (ref 19–32)
CREATININE: 1.84 mg/dL — AB (ref 0.50–1.35)
Calcium: 8.6 mg/dL (ref 8.4–10.5)
Chloride: 107 mEq/L (ref 96–112)
GFR calc non Af Amer: 38 mL/min — ABNORMAL LOW (ref >90)
GFR, EST AFRICAN AMERICAN: 44 mL/min — AB (ref >90)
Glucose, Bld: 124 mg/dL — ABNORMAL HIGH (ref 70–99)
Potassium: 3.4 mEq/L — ABNORMAL LOW (ref 3.7–5.3)
SODIUM: 142 meq/L (ref 137–147)

## 2013-09-26 LAB — GLUCOSE, CAPILLARY
GLUCOSE-CAPILLARY: 80 mg/dL (ref 70–99)
GLUCOSE-CAPILLARY: 67 mg/dL — AB (ref 70–99)
GLUCOSE-CAPILLARY: 125 mg/dL — AB (ref 70–99)
GLUCOSE-CAPILLARY: 122 mg/dL — AB (ref 70–99)
Glucose-Capillary: 79 mg/dL (ref 70–99)
Glucose-Capillary: 83 mg/dL (ref 70–99)
Glucose-Capillary: 85 mg/dL (ref 70–99)

## 2013-09-26 LAB — CBC
HCT: 31.1 % — ABNORMAL LOW (ref 39.0–52.0)
Hemoglobin: 10.1 g/dL — ABNORMAL LOW (ref 13.0–17.0)
MCH: 28 pg (ref 26.0–34.0)
MCHC: 32.5 g/dL (ref 30.0–36.0)
MCV: 86.1 fL (ref 78.0–100.0)
Platelets: 49 10*3/uL — ABNORMAL LOW (ref 150–400)
RBC: 3.61 MIL/uL — ABNORMAL LOW (ref 4.22–5.81)
RDW: 21.1 % — AB (ref 11.5–15.5)
WBC: 4.1 10*3/uL (ref 4.0–10.5)

## 2013-09-26 MED ORDER — ALTEPLASE 2 MG IJ SOLR
2.0000 mg | Freq: Once | INTRAMUSCULAR | Status: DC
  Filled 2013-09-26: qty 2

## 2013-09-26 MED ORDER — POTASSIUM CHLORIDE CRYS ER 20 MEQ PO TBCR
20.0000 meq | EXTENDED_RELEASE_TABLET | Freq: Two times a day (BID) | ORAL | Status: AC
  Administered 2013-09-26 (×2): 20 meq via ORAL
  Filled 2013-09-26 (×2): qty 1

## 2013-09-26 MED ORDER — POTASSIUM CHLORIDE IN NACL 20-0.45 MEQ/L-% IV SOLN
INTRAVENOUS | Status: DC
  Administered 2013-09-26 – 2013-09-28 (×3): via INTRAVENOUS
  Filled 2013-09-26 (×10): qty 1000

## 2013-09-26 NOTE — Progress Notes (Signed)
Mr. Schanz has been complaining on pressure and pain and unable to urinate since the removal of his foley at 1700 on 09/25/13. At 0130 i did a bladder scan and it showed greater than 680mL. I then did an in and out cath and inserted the cath entirely until meeting resistance with no urine output. I called the hospitalist an explained the situation and she ordered me to place and foley instead of in and out to see if we got any results. I placed a 8F foley until meeting resistance and urine begin to flow dark bloody with clots inside bag. Hospitalist called to check and ordered to one time irrigate foley. Patient states he feels better less pressure and tightness in the groin area. William Hamburger RN

## 2013-09-26 NOTE — Progress Notes (Signed)
Subjective:  Still with some rectal bleeding, but less. Had urinary retention last night after catheter removed requiring replacement of foley. Noted to have dark blood/clots. Still with hematuria this morning. Denies blood in ostomy. No abdominal pain since foley replaced.   Objective: Vital signs in last 24 hours: Temp:  [97.3 F (36.3 C)-98.7 F (37.1 C)] 98.1 F (36.7 C) (04/29 2100) Pulse Rate:  [72-96] 77 (04/30 0421) Resp:  [14-22] 20 (04/30 0421) BP: (134-164)/(61-86) 160/76 mmHg (04/30 0421) SpO2:  [89 %-99 %] 95 % (04/30 0421) Last BM Date: 09/25/13 General:   Alert,  Well-developed, well-nourished, pleasant and cooperative in NAD Head:  Normocephalic and atraumatic. Eyes:  Sclera clear, no icterus.   Abdomen:  Soft, nontender and nondistended.  Normal bowel sounds, without guarding, and without rebound.  Ostomy with brown stool. Extremities:  Without clubbing, deformity or edema. Neurologic:  Alert and  oriented x4;  grossly normal neurologically. Skin:  Intact without significant lesions or rashes. Psych:  Alert and cooperative. Normal mood and affect.  Intake/Output from previous day: 04/29 0701 - 04/30 0700 In: 2840 [P.O.:240; I.V.:2600] Out: 950 [Urine:900; Stool:50] Intake/Output this shift:    Lab Results: CBC  Recent Labs  09/25/13 0515 09/25/13 0940 09/26/13 0512  WBC 4.8 4.3 4.1  HGB 9.9* 10.0* 10.1*  HCT 30.6* 30.4* 31.1*  MCV 86.0 85.9 86.1  PLT 56* 45* 49*   BMET  Recent Labs  09/24/13 0445 09/25/13 0515 09/26/13 0512  NA 145 145 142  K 3.6* 3.7 3.4*  CL 113* 110 107  CO2 21 23 24   GLUCOSE 100* 83 124*  BUN 11 9 9   CREATININE 2.13* 2.01* 1.84*  CALCIUM 8.6 8.7 8.6   LFTs No results found for this basename: BILITOT, BILIDIR, IBILI, ALKPHOS, AST, ALT, PROT, ALBUMIN,  in the last 72 hours No results found for this basename: LIPASE,  in the last 72 hours PT/INR No results found for this basename: LABPROT, INR,  in the last  72 hours    Imaging Studies: Ct Chest W Contrast  09/03/2013   CLINICAL DATA:  Metastatic rectal carcinoma. Left flank pain. Restaging.  EXAM: CT CHEST, ABDOMEN, AND PELVIS WITH CONTRAST  TECHNIQUE: Multidetector CT imaging of the chest, abdomen and pelvis was performed following the standard protocol during bolus administration of intravenous contrast.  CONTRAST:  156mL OMNIPAQUE IOHEXOL 300 MG/ML  SOLN  COMPARISON:  US RENAL dated 05/01/2013; CT ABD - PELV W/ CM dated 04/18/2013; CT ABD - PELV W/ CM dated 01/14/2013  FINDINGS: CT CHEST FINDINGS  Left subclavian Port-A-Cath tip appears unchanged at the SVC right atrial junction. There are no enlarged mediastinal, hilar or axillary lymph nodes. Sebaceous cyst in the upper right back is unchanged.  There is stable mild atherosclerosis of the coronary arteries, aorta and great vessels. There is no significant pleural or pericardial effusion. The heart size is normal. Bilateral gynecomastia is noted.  There is stable scarring in the right upper lobe. The previously demonstrated 5 mm nodule medially in the right upper lobe is smaller and less well-defined on the axial images. No new or enlarging pulmonary nodules identified.  There are no worrisome osseous findings.  CT ABDOMEN AND PELVIS FINDINGS  Mild contour irregularity of the liver appears stable. There are no focal liver lesions. There is stable splenomegaly with a stable possible infarct posteriorly in the spleen on image 55. The gallbladder is surgically absent. There is no biliary dilatation. The pancreas appears normal.  There is no adrenal mass. Multiple bilateral renal cysts are stable. There are bilateral renal calculi with a 1.7 cm calculus in the left renal pelvis. Dilatation of the left renal pelvis appears progressive and there is delayed contrast excretion from the left kidney. Both kidneys demonstrate mild cortical thinning.  The left ureter appears dilated into the pelvis where there is increasing  oval soft tissue adjacent to the rectal suture line. This measures 3.9 x 3.0 cm on image 96 and is concerning for local recurrence with resulting ureteral obstruction. Small perirectal lymph nodes and perirectal soft tissue stranding are stable. The bladder, prostate gland and seminal vesicles appear stable. There is a limited fat plane between the suspected local recurrence and the bladder, although no gross bladder invasion.  No ascites or other abdominal lymphadenopathy is seen. The stomach and small bowel appear stable with parastomal herniation of small bowel surrounding the right lower quadrant ileostomy  Chronic thoracolumbar compression deformities appear stable. There are no worrisome osseous findings.  IMPRESSION: 1. Increased soft tissue density adjacent to the rectal suture line worrisome for local recurrence of rectal adenocarcinoma. This is supported by apparent partial obstruction of the distal left ureter. 2. No other signs of metastatic disease within the abdomen or pelvis. 3. Previously demonstrated right upper lobe pulmonary nodule is less well-defined and smaller, consistent with a treated lesion. 4. Stable bilateral renal calculi. Calculus in the left renal pelvis does not appear to be obstructing the UPJ with the patient supine. 5. Stable parastomal herniation of small bowel surrounding the ileostomy.   Electronically Signed   By: Camie Patience M.D.   On: 09/03/2013 17:41      Assessment: 63 year old male admitted with rectal bleeding with known prior history of metastatic rectal cancer, s/p LAR in 2008. He has ileostomy. Flex sig this admission with circumferential rectal mass. Initial biopsies inconclusive. Repeat FS yesterday for additional biopsies. Noted to have fungating exophytic rectal mass. Urology on board due to left-sided hydronephrosis.  Cystoscopy with stent placement. Has developed gross hematuria. Thrombocytopenia stable. H/H stable.   Plan: 1. F/u pending path.   LOS: 6  days   Mahala Menghini  09/26/2013, 7:59 AM

## 2013-09-26 NOTE — Progress Notes (Signed)
Subjective: Patient in bed.  His spirits are high.    I discussed his outpatient plan.  Once discharged, he will follow-up with Dr. [name] for consideration of local treatment with Radiation to the pelvis.  Following this, we may consider Cetuximab therapy or follow him along.  He notes that the nurses infused a sugar solution through his port when his glucose was low and since then, the nurses cannot infuse fluid from port or get blood return.  I will discuss this with nursing staff.  He will need alteplase treatment.  Objective: Vital signs in last 24 hours: Temp:  [98.1 F (36.7 C)] 98.1 F (36.7 C) (04/29 2100) Pulse Rate:  [72-77] 77 (04/30 0421) Resp:  [20] 20 (04/30 0421) BP: (140-160)/(72-81) 160/76 mmHg (04/30 0421) SpO2:  [94 %-96 %] 95 % (04/30 0421)  Intake/Output from previous day: 04/29 0800 - 04/30 0759 In: 2840 [P.O.:240; I.V.:2600] Out: 950 [Urine:900; Stool:50] Intake/Output this shift: Total I/O In: 2240 [P.O.:240; I.V.:2000] Out: 650 [Urine:600; Stool:50]  General appearance: alert, cooperative and no distress  Lab Results:   Recent Labs  09/25/13 0940 09/26/13 0512  WBC 4.3 4.1  HGB 10.0* 10.1*  HCT 30.4* 31.1*  PLT 45* 49*   BMET  Recent Labs  09/25/13 0515 09/26/13 0512  NA 145 142  K 3.7 3.4*  CL 110 107  CO2 23 24  GLUCOSE 83 124*  BUN 9 9  CREATININE 2.01* 1.84*  CALCIUM 8.7 8.6    Studies/Results: US Renal  09/26/2013   CLINICAL DATA:  Hydronephrosis to see improvement since ureteral srent  EXAM: RENAL/URINARY TRACT ULTRASOUND COMPLETE  COMPARISON:  DG RETROGRADE PYELOGRAM dated 09/24/2013; US RENAL dated 09/21/2013  FINDINGS: Right Kidney:  Length: 13.1 cm. Stable benign appearing cyst within the right kidney. No evidence of hydronephrosis.  Left Kidney:  Length: 14.9 cm. Mild hydronephrosis is appreciated within the kidney consistent with decompression, status post stent placement. Previous moderate hydronephrosis. Multiple  echogenic foci consistent with calculi in the medullary portion of the kidney. The ureteral stent is partially visualized. Multiple stable bilateral cyst within the left kidney.  Bladder:  Decompressed secondary to Foley catheter insertion  IMPRESSION: Interval decompression of the left kidney with residual mild hydronephrosis.  Medullary calculi left kidney  Bilateral renal cysts   Electronically Signed   By: Margaree Mackintosh M.D.   On: 09/26/2013 15:10    Medications: I have reviewed the patient's current medications.  Assessment/Plan: 1. 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 Biopsy is negative. The ileostomy in place and functioning well.  He will need an outpatient appointment with Dr. [name] and this is scheduled for 5/1.  We will [facility] him at the [facility] in about 2 weeks.   Patient and plan discussed with Dr. Farrel Gobble and he is in agreement with the aforementioned.      LOS: 6 days    [facility] 09/26/2013

## 2013-09-26 NOTE — Progress Notes (Signed)
REVIEWED. Path pending from APR 28. DR. [name] AWARE PT INPT.

## 2013-09-26 NOTE — Progress Notes (Signed)
Ileostomy bag changed, 1 piece appliance placed, stoma pink & moist. Paste applied to wafer prior to placement. Wafer cut to 1.5cm by wife. Patent & intact. Small open area of irritation noted to peri-stoma. Pt tolerated well.

## 2013-09-26 NOTE — Progress Notes (Signed)
Nutrition Brief Note  RD pulled to chart for LOS  Wt Readings from Last 15 Encounters:  09/24/13 223 lb 1.7 oz (101.2 kg)  09/24/13 223 lb 1.7 oz (101.2 kg)  09/24/13 223 lb 1.7 oz (101.2 kg)  09/24/13 223 lb 1.7 oz (101.2 kg)  09/24/13 223 lb 1.7 oz (101.2 kg)  09/24/13 223 lb 1.7 oz (101.2 kg)  09/05/13 221 lb 14.4 oz (100.653 kg)  08/13/13 223 lb 12.8 oz (101.515 kg)  08/02/13 223 lb (101.152 kg)  07/10/13 222 lb 11.2 oz (101.016 kg)  07/02/13 222 lb 6.4 oz (100.88 kg)  05/03/13 220 lb 14.4 oz (100.2 kg)  05/03/13 220 lb 14.4 oz (100.2 kg)  04/22/13 228 lb 6.4 oz (103.602 kg)  03/07/13 219 lb 8 oz (99.565 kg)    Body mass index is 32.01 kg/(m^2). Patient meets criteria for obesity, class I based on current BMI.   Current diet order is heart healthy/ carb modified, patient is consuming approximately 75-100% of meals at this time. Labs and medications reviewed.   No nutrition interventions warranted at this time. If nutrition issues arise, please consult RD.   Kaid Seeberger A. Jimmye Norman, RD, LDN Pager: [phone]

## 2013-09-26 NOTE — Progress Notes (Signed)
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 [name]. He reported that [facility] arranged a colonoscopy because the saw something at his rectum." He denies blood in his colostomy bag but, reported passing blood and fluid from his rectum.   Biopsies revealed:   Past/Anticipated interventions by surgeon, if any: initially patient underwent low anterior resection of rectal ca; has colostomy bag; now with recurrent rectum mass.  Past/Anticipated interventions by medical oncology, if any: had successful control of metastatic disease in lung with chemo and radiotherapy   Weight changes, if any: None noted  Bowel/Bladder complaints, if any: passing blood from rectum  Nausea / Vomiting, if any: Denies  Pain issues, if any:  None noted  SAFETY ISSUES:  Prior radiation? YES  Pacemaker/ICD? NO  Possible current pregnancy? N/A  Is the patient on methotrexate? NO  Current Complaints / other details:  63 year old male.   If the patient is hemodynamically stable for hospital discharge, it would be helpful to pursue PET-CT for re-staging purposes in the outpatient setting. Also, given the anatomic changes associated with his previous LAR and pelvic radiotherapy, I would consider pelvic MRI to better delineate the current pelvic/rectal mass for radiation targeting.

## 2013-09-26 NOTE — Progress Notes (Signed)
Went into urinary retention most likely from blood clot will leave foley in he could have rectal ca pushing prostate to cause urinary retention leave foley in till decide about further management of rectal ca will get renal ultrasound to see l hydronephrosis since stent.

## 2013-09-26 NOTE — Progress Notes (Signed)
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 He had undergone surgery/radiation therapy/chemotherapy. He currently has an ileostomy in his right lower quadrant. Patient presents to the hospital with rectal bleeding. He underwent flexible sigmoidoscopy on admission and was found to have a rectal mass. This was biopsied. He was admitted to the hospital for further observation of bleeding. Fortunately, bleeding appeared to have tapered off on its own. He was transfused a total of 2 units of PRBCs during his hospitalization. Biopsy results from the mass did not indicate any clear malignancy. Due to its appearance and high suspicion for malignancy, flexible sigmoidoscopy was repeated on 4/29 for repeat biopsy. The patient was also noted to be in acute renal failure. Renal ultrasound indicated left-sided hydronephrosis. This was likely related to external compression from his malignancy. He was seen by urology and underwent cystoscopy with stent placement on 4/28.   Assessment/Plan: 1. GI bleeding from rectal mass. Patient has a history of stage IV colorectal cancer and had undergone surgery/radiotherapy as well as chemotherapy. He currently has a ileostomy in his right lower quadrant. It appears that the has a recurrent rectal mass which began to bleed. Patient has flexible sigmoidoscopy on admission with biopsy of the mass. He was followed by both GI and oncology during his hospitalization. Interestingly, biopsy results did not indicate any malignancy. Proctoscopy/sigmoidoscopy with biopsies performed today 4/29 by Dr. 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 Pathology report pending. Once he is discharged, he will need to follow up with  radiation oncologist, Dr. [name] for continued radiation to the mass to help with any recurrent bleeding. 2. Acute blood loss anemia, on chronic disease and in the setting of thrombocytopenia. Patient was transfused 2 units of PRBCs. Posttransfusion hemoglobin appears to be improved and stable for the last couple of days. Continue to follow closely because he has 2 sources of bleeding colon from the rectum and from the bladder. 3. Thrombocytopenia, chronic, likely related to chemotherapy/radiation. Status post platelet transfusion on 4/28. Appears to be stable. Continue to follow 4. Gross hematuria, status post cystoscopy, left ureteral stent placement, and removal of small bladder calculi on 4/28 per Dr. [name]. We'll continue IV fluid hydration and monitoring of his hemoglobin/hematocrit closely. Followup renal ultrasound per Dr. [name]. 5. Left hydronephrosis. As above in #4. The patient had urinary retention on 4/29 after the Foley was removed for voiding trial. It has been reinserted and will stay in for now per Dr. [name]. 6. Acute renal failure, possibly post obstructive in etiology. The patient likely has chronic renal disease stage III. His creatinine was above baseline, but it is improving. Renal ultrasound indicated left-sided hydronephrosis. He underwent cystoscopy 4/28 with left ureteral stent placement. His renal function improved minimally. Continue with IV fluids and follow. Monitor urine output. 7. Diabetes. Stable and controlled. Continue sliding scale insulin. 8. Hypokalemia. This is likely secondary to hypotonic IV fluids. Will replete orally and add potassium to the IV fluids. .  Code Status: full code Family Communication: discussed with patient; wife not available currently. Disposition Plan: discharge home once improved   Consultants: Gastroenterology Urology Oncology  Procedures:  4/29:Multiple biopsies were taken for the pathologist, at least 6. The  patient probably  lost 10-15 mL of blood acutely with these maneuvers,  but  otherwise tolerated the procedure well.  IMPRESSION: Fungating exophytic rectal mass most likely representing  recurrent adenocarcinoma status post biopsy.  4/28: CYSTOSCOPY WITH LEFT RETROGRADE PYELOGRAM; LEFT URETERAL STENT PLACEMENT; REMOVAL OF SMALL BLADDER CALCULI (Left)  Flex sig: RECTAL BLEEDING DUE TO Near circumferential mass in the rectum  Antibiotics:    HPI/Subjective: Patient has less pain compared to last night after the Foley catheter was reinserted. He still has some mild rectal bleeding. He appears to be more concerned about the blood seen in the Foley bag.  Objective: Filed Vitals:   09/26/13 0421  BP: 160/76  Pulse: 77  Temp:   Resp: 20   oxygen saturation 97%. Followup blood pressure 138/72.  Intake/Output Summary (Last 24 hours) at 09/26/13 1553 Last data filed at 09/26/13 1200  Gross per 24 hour  Intake   5080 ml  Output   1150 ml  Net   3930 ml   Filed Weights   09/20/13 1651 09/23/13 2150 09/24/13 0636  Weight: 96.7 kg (213 lb 3 oz) 97.523 kg (215 lb) 101.2 kg (223 lb 1.7 oz)    Exam:   General:  NAD  Cardiovascular: S1, S2, no murmurs rubs or gallops.  Respiratory: CTA B  Abdomen: soft, mildly obese, nontender, nondistended, ileostomy in RLQ with semisolid stool-no melena or bright red blood.  GU: Indwelling Foley catheter draining bloody urine with occasional clots.  Musculoskeletal: no acute hot red joints; no pedal edema.  Neurologic/psychiatric: He is alert and oriented x3. Cranial nerves II through XII are intact.  Data Reviewed: Basic Metabolic Panel:  Recent Labs Lab 09/22/13 0618 09/23/13 0444 09/24/13 0445 09/25/13 0515 09/26/13 0512  NA 143 146 145 145 142  K 3.8 3.7 3.6* 3.7 3.4*  CL 111 113* 113* 110 107  CO2 23 22 21 23 24   GLUCOSE 95 79 100* 83 124*  BUN 12 11 11 9 9   CREATININE 2.22* 2.33* 2.13* 2.01* 1.84*  CALCIUM 8.5 8.5 8.6 8.7 8.6   Liver  Function Tests:  Recent Labs Lab 09/20/13 1305 09/21/13 0603  AST 30 25  ALT 15 14  ALKPHOS 127* 115  BILITOT 1.2 1.2  PROT 6.3 6.1  ALBUMIN 3.2* 2.9*   No results found for this basename: LIPASE, AMYLASE,  in the last 168 hours No results found for this basename: AMMONIA,  in the last 168 hours CBC:  Recent Labs Lab 09/20/13 1305  09/23/13 0444 09/23/13 1948 09/24/13 0445 09/25/13 0515 09/25/13 0940 09/26/13 0512  WBC 3.9*  < > 3.0*  --  3.5* 4.8 4.3 4.1  NEUTROABS 3.1  --   --   --   --   --   --   --   HGB 9.3*  < > 7.9* 9.6* 9.7* 9.9* 10.0* 10.1*  HCT 28.8*  < > 24.4* 29.0* 29.7* 30.6* 30.4* 31.1*  MCV 85.7  < > 86.2  --  84.9 86.0 85.9 86.1  PLT 41*  < > 45*  --  45* 56* 45* 49*  < > = values in this interval not displayed. Cardiac Enzymes: No results found for this basename: CKTOTAL, CKMB, CKMBINDEX, TROPONINI,  in the last 168 hours BNP (last 3 results) No results found for this basename: PROBNP,  in the last 8760 hours CBG:  Recent Labs Lab 09/25/13 2102 09/25/13 2359 09/26/13 0402 09/26/13 0757 09/26/13 1124  GLUCAP 91 79 67* 125* 85    Recent Results (from the past 240 hour(s))  SURGICAL PCR SCREEN  Status: None   Collection Time    09/22/13 10:00 PM      Result Value Ref Range Status   MRSA, PCR NEGATIVE  NEGATIVE Final   Staphylococcus aureus NEGATIVE  NEGATIVE Final   Comment:            The Xpert SA Assay (FDA     approved for NASAL specimens     in patients over 2 years of age),     is one component of     a comprehensive surveillance     program.  Test performance has     been validated by [employer] for patients greater     than or equal to 53 year old.     It is not intended     to diagnose infection nor to     guide or monitor treatment.     Studies: US Renal  09/26/2013   CLINICAL DATA:  Hydronephrosis to see improvement since ureteral srent  EXAM: RENAL/URINARY TRACT ULTRASOUND COMPLETE  COMPARISON:  DG RETROGRADE  PYELOGRAM dated 09/24/2013; US RENAL dated 09/21/2013  FINDINGS: Right Kidney:  Length: 13.1 cm. Stable benign appearing cyst within the right kidney. No evidence of hydronephrosis.  Left Kidney:  Length: 14.9 cm. Mild hydronephrosis is appreciated within the kidney consistent with decompression, status post stent placement. Previous moderate hydronephrosis. Multiple echogenic foci consistent with calculi in the medullary portion of the kidney. The ureteral stent is partially visualized. Multiple stable bilateral cyst within the left kidney.  Bladder:  Decompressed secondary to Foley catheter insertion  IMPRESSION: Interval decompression of the left kidney with residual mild hydronephrosis.  Medullary calculi left kidney  Bilateral renal cysts   Electronically Signed   By: Margaree Mackintosh M.D.   On: 09/26/2013 15:10    Scheduled Meds: . acetaminophen  650 mg Oral Once  . diphenhydrAMINE  25 mg Oral Once  . insulin aspart  0-5 Units Subcutaneous QHS  . insulin aspart  0-9 Units Subcutaneous TID WC  . pantoprazole  40 mg Oral Daily  . potassium chloride  20 mEq Oral BID   Continuous Infusions: . sodium chloride 100 mL/hr at 09/26/13 0940  . sodium chloride 100 mL/hr at 09/23/13 9373    Active Problems:   DIABETES MELLITUS, TYPE II   Adenocarcinoma of rectum   Pancytopenia, acquired   Thrombocytopenia   Rectal bleeding   ARF (acute renal failure)   Hydronephrosis   Hydronephrosis of left kidney   GI bleeding    Time spent: 30 mins    Rexene Alberts  [facility] Pager [phone]. If 7PM-7AM, please contact night-coverage at [email or web address], password [facility] 09/26/2013, 3:53 PM  LOS: 6 days

## 2013-09-27 ENCOUNTER — Institutional Professional Consult (permissible substitution): Payer: Managed Care, Other (non HMO) | Admitting: Radiation Oncology

## 2013-09-27 ENCOUNTER — Telehealth: Payer: Self-pay

## 2013-09-27 ENCOUNTER — Encounter (HOSPITAL_COMMUNITY): Payer: Self-pay | Admitting: Internal Medicine

## 2013-09-27 DIAGNOSIS — D62 Acute posthemorrhagic anemia: Secondary | ICD-10-CM

## 2013-09-27 HISTORY — DX: Acute posthemorrhagic anemia: D62

## 2013-09-27 LAB — BASIC METABOLIC PANEL
BUN: 10 mg/dL (ref 6–23)
CALCIUM: 8.6 mg/dL (ref 8.4–10.5)
CHLORIDE: 110 meq/L (ref 96–112)
CO2: 23 mEq/L (ref 19–32)
CREATININE: 1.84 mg/dL — AB (ref 0.50–1.35)
GFR calc Af Amer: 44 mL/min — ABNORMAL LOW (ref >90)
GFR, EST NON AFRICAN AMERICAN: 38 mL/min — AB (ref >90)
Glucose, Bld: 80 mg/dL (ref 70–99)
Potassium: 3.9 mEq/L (ref 3.7–5.3)
Sodium: 144 mEq/L (ref 137–147)

## 2013-09-27 LAB — CBC WITH DIFFERENTIAL/PLATELET
Basophils Absolute: 0 10*3/uL (ref 0.0–0.1)
Basophils Relative: 0 % (ref 0–1)
EOS PCT: 4 % (ref 0–5)
Eosinophils Absolute: 0.2 10*3/uL (ref 0.0–0.7)
HCT: 29.9 % — ABNORMAL LOW (ref 39.0–52.0)
Hemoglobin: 9.7 g/dL — ABNORMAL LOW (ref 13.0–17.0)
LYMPHS ABS: 0.5 10*3/uL — AB (ref 0.7–4.0)
Lymphocytes Relative: 10 % — ABNORMAL LOW (ref 12–46)
MCH: 27.6 pg (ref 26.0–34.0)
MCHC: 32.4 g/dL (ref 30.0–36.0)
MCV: 85.2 fL (ref 78.0–100.0)
MONO ABS: 0.4 10*3/uL (ref 0.1–1.0)
Monocytes Relative: 10 % (ref 3–12)
Neutro Abs: 3.4 10*3/uL (ref 1.7–7.7)
Neutrophils Relative %: 76 % (ref 43–77)
PLATELETS: 54 10*3/uL — AB (ref 150–400)
RBC: 3.51 MIL/uL — ABNORMAL LOW (ref 4.22–5.81)
RDW: 21 % — AB (ref 11.5–15.5)
WBC: 4.4 10*3/uL (ref 4.0–10.5)

## 2013-09-27 LAB — GLUCOSE, CAPILLARY
GLUCOSE-CAPILLARY: 129 mg/dL — AB (ref 70–99)
GLUCOSE-CAPILLARY: 80 mg/dL (ref 70–99)
GLUCOSE-CAPILLARY: 93 mg/dL (ref 70–99)
Glucose-Capillary: 87 mg/dL (ref 70–99)
Glucose-Capillary: 88 mg/dL (ref 70–99)
Glucose-Capillary: 94 mg/dL (ref 70–99)
Glucose-Capillary: 99 mg/dL (ref 70–99)

## 2013-09-27 LAB — CBC
HCT: 28 % — ABNORMAL LOW (ref 39.0–52.0)
Hemoglobin: 9.1 g/dL — ABNORMAL LOW (ref 13.0–17.0)
MCH: 27.8 pg (ref 26.0–34.0)
MCHC: 32.5 g/dL (ref 30.0–36.0)
MCV: 85.6 fL (ref 78.0–100.0)
PLATELETS: 43 10*3/uL — AB (ref 150–400)
RBC: 3.27 MIL/uL — ABNORMAL LOW (ref 4.22–5.81)
RDW: 21.1 % — AB (ref 11.5–15.5)
WBC: 3.5 10*3/uL — ABNORMAL LOW (ref 4.0–10.5)

## 2013-09-27 MED ORDER — FUROSEMIDE 10 MG/ML IJ SOLN
20.0000 mg | Freq: Once | INTRAMUSCULAR | Status: AC
  Administered 2013-09-27: 20 mg via INTRAVENOUS
  Filled 2013-09-27: qty 2

## 2013-09-27 NOTE — Telephone Encounter (Signed)
Patient scheduled for appointment in nursing at 12:00 noon.Called to check status as he was late and he is an inpatient at [facility] room 309.I will inform Dr.Manning.

## 2013-09-27 NOTE — Progress Notes (Signed)
REVIEWED. Path suspicious for RECTAL [place]. PT SHOULD FOLLOW UP WITH RADIATION ONCOLOGY.

## 2013-09-27 NOTE — Progress Notes (Signed)
Subjective:  No complaints. Small amount of bloody discharge per rectum at times.   Objective: Vital signs in last 24 hours: Temp:  [98.1 F (36.7 C)-98.8 F (37.1 C)] 98.8 F (37.1 C) (05/01 0621) Pulse Rate:  [62-79] 79 (05/01 0621) Resp:  [20] 20 (05/01 0621) BP: (136-169)/(72-93) 169/93 mmHg (05/01 0621) SpO2:  [96 %-97 %] 96 % (05/01 0621) Last BM Date: 09/26/13 General:   Alert,  Well-developed, well-nourished, pleasant and cooperative in NAD Head:  Normocephalic and atraumatic. Eyes:  Sclera clear, no icterus.   Abdomen:  Soft, nontender and nondistended.  Normal bowel sounds, without guarding, and without rebound.   Extremities:  Without clubbing, deformity or edema. Neurologic:  Alert and  oriented x4;  grossly normal neurologically. Skin:  Intact without significant lesions or rashes. Psych:  Alert and cooperative. Normal mood and affect.  Intake/Output from previous day: 04/30 0701 - 05/01 0700 In: 4260 [P.O.:840; I.V.:3420] Out: 3250 [Urine:3200; Stool:50] Intake/Output this shift:    Lab Results: CBC  Recent Labs  09/25/13 0940 09/26/13 0512 09/27/13 0502  WBC 4.3 4.1 3.5*  HGB 10.0* 10.1* 9.1*  HCT 30.4* 31.1* 28.0*  MCV 85.9 86.1 85.6  PLT 45* 49* 43*   BMET  Recent Labs  09/25/13 0515 09/26/13 0512 09/27/13 0502  NA 145 142 144  K 3.7 3.4* 3.9  CL 110 107 110  CO2 23 24 23   GLUCOSE 83 124* 80  BUN 9 9 10   CREATININE 2.01* 1.84* 1.84*  CALCIUM 8.7 8.6 8.6   Assessment:  63 year old male admitted with rectal bleeding with known prior history of metastatic rectal cancer, s/p LAR in 2008. He has ileostomy. Flex sig this admission with circumferential rectal mass. Initial biopsies inconclusive. Repeat FS for additional biopsies, results pending. Noted to have fungating exophytic rectal mass. Urology on board due to left-sided hydronephrosis. Cystoscopy with stent placement. Has developed gross hematuria. Thrombocytopenia stable. H/H stable.     Plan: 1. Await path.   LOS: 7 days   Mahala Menghini  09/27/2013, 7:47 AM

## 2013-09-27 NOTE — Progress Notes (Signed)
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 He had undergone surgery/radiation therapy/chemotherapy. He currently has an ileostomy in his right lower quadrant. Patient presents to the hospital with rectal bleeding. He underwent flexible sigmoidoscopy on admission and was found to have a rectal mass. This was biopsied. He was admitted to the hospital for further observation of bleeding. Fortunately, bleeding appeared to have tapered off on its own. He was transfused a total of 2 units of PRBCs during his hospitalization. Biopsy results from the mass did not indicate any clear malignancy. Due to its appearance and high suspicion for malignancy, flexible sigmoidoscopy was repeated on 4/29 for repeat biopsy. The patient was also noted to be in acute renal failure. Renal ultrasound indicated left-sided hydronephrosis. This was likely related to external compression from his malignancy. He was seen by urology and underwent cystoscopy with stent placement on 4/28.   Assessment/Plan: 1. GI bleeding from rectal mass. Patient has a history of stage IV colorectal cancer and had undergone surgery/radiotherapy as well as chemotherapy. He currently has a ileostomy in his right lower quadrant. It appears that the has a recurrent rectal mass which began to bleed. Patient has flexible sigmoidoscopy on admission with biopsy of the mass. He was followed by both GI and oncology during his hospitalization. Interestingly, biopsy results did not indicate any malignancy. Proctoscopy/sigmoidoscopy with biopsies performed today 4/29 by Dr. 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 Pathology report pending. Once he is discharged, he will need to follow up with  radiation oncologist, Dr. [name] for continued radiation to the mass to help with any recurrent bleeding. 2. Acute blood loss anemia, on chronic disease and in the setting of thrombocytopenia. Patient was transfused 2 units of PRBCs. Posttransfusion hemoglobin had been stable, but has decreased 1 g over the past 24 hours. Continue to follow closely because he has 2 sources of bleeding: from the rectum and from the bladder. 3. Thrombocytopenia, chronic, likely related to chemotherapy/radiation. Status post platelet transfusion on 4/28. In the setting of 2 sources of bleeding, will give him another platelet transfusion today 09/27/13. Discussed with oncology. 4. Gross hematuria, status post cystoscopy, left ureteral stent placement, and removal of small bladder calculi on 4/28 per Dr. [name]. We'll continue IV fluid hydration and monitoring of his hemoglobin/hematocrit closely. Followup renal ultrasound per Dr. [name]. 5. Left hydronephrosis. As above in #4. The patient had urinary retention on 4/29 after the Foley was removed for voiding trial. It has been reinserted and will stay in for now per Dr. [name]. Followup renal ultrasound on 4/30 revealed interval decompression of the left kidney with residual mild hydronephrosis, medullary calculi in the left kidney, and bilateral renal cysts. 6. Acute renal failure, possibly post obstructive in etiology. The patient likely has chronic renal disease stage III. His creatinine was above baseline, but it is improving. Renal ultrasound indicated left-sided hydronephrosis. He underwent cystoscopy 4/28 with left ureteral stent placement. His renal function improved minimally. Continue with IV fluids and follow. Monitor urine output. 7. Diabetes. Stable and controlled. Continue sliding scale insulin. 8. Hypokalemia. This is likely secondary to hypotonic IV fluids. Serum potassium better, status post oral repletion and potassium in the IV fluids. 9. Disposition. Likely  discharge in the next 24 hours. .  Code Status: full  code Family Communication: discussed with patient; wife not available currently. Disposition Plan: discharge home once improved   Consultants: Gastroenterology Urology Oncology  Procedures:  4/29:Multiple biopsies were taken for the pathologist, at least 6. The  patient probably lost 10-15 mL of blood acutely with these maneuvers,  but otherwise tolerated the procedure well.  IMPRESSION: Fungating exophytic rectal mass most likely representing  recurrent adenocarcinoma status post biopsy.  4/28: CYSTOSCOPY WITH LEFT RETROGRADE PYELOGRAM; LEFT URETERAL STENT PLACEMENT; REMOVAL OF SMALL BLADDER CALCULI (Left)  Flex sig: RECTAL BLEEDING DUE TO Near circumferential mass in the rectum  Antibiotics:    HPI/Subjective: Patient is feeling better overall. He has continued but small amounts of rectal bleeding. He has no other complaints other than wanting to have the Foley catheter taken out before he is discharged from the hospital. I informed him that it will need to be left in and he will receive instructions on how to take care of at home.  Objective: Filed Vitals:   09/27/13 0621  BP: 169/93  Pulse: 79  Temp: 98.8 F (37.1 C)  Resp: 20   oxygen saturation 96% on air.  Intake/Output Summary (Last 24 hours) at 09/27/13 1430 Last data filed at 09/27/13 1300  Gross per 24 hour  Intake   2260 ml  Output   3525 ml  Net  -1265 ml   Filed Weights   09/20/13 1651 09/23/13 2150 09/24/13 0636  Weight: 96.7 kg (213 lb 3 oz) 97.523 kg (215 lb) 101.2 kg (223 lb 1.7 oz)    Exam:   General:  NAD  Cardiovascular: S1, S2, no murmurs rubs or gallops.  Respiratory: CTA B  Abdomen: soft, mildly obese, nontender, nondistended, ileostomy in RLQ with semisolid stool-no melena or bright red blood.  GU: Indwelling Foley catheter draining bloody urine, but less dark and no clots noted in the Foley bag.  Musculoskeletal: no acute  hot red joints; no pedal edema.  Neurologic/psychiatric: He is alert and oriented x3. Cranial nerves II through XII are intact.  Data Reviewed: Basic Metabolic Panel:  Recent Labs Lab 09/23/13 0444 09/24/13 0445 09/25/13 0515 09/26/13 0512 09/27/13 0502  NA 146 145 145 142 144  K 3.7 3.6* 3.7 3.4* 3.9  CL 113* 113* 110 107 110  CO2 22 21 23 24 23   GLUCOSE 79 100* 83 124* 80  BUN 11 11 9 9 10   CREATININE 2.33* 2.13* 2.01* 1.84* 1.84*  CALCIUM 8.5 8.6 8.7 8.6 8.6   Liver Function Tests:  Recent Labs Lab 09/21/13 0603  AST 25  ALT 14  ALKPHOS 115  BILITOT 1.2  PROT 6.1  ALBUMIN 2.9*   No results found for this basename: LIPASE, AMYLASE,  in the last 168 hours No results found for this basename: AMMONIA,  in the last 168 hours CBC:  Recent Labs Lab 09/24/13 0445 09/25/13 0515 09/25/13 0940 09/26/13 0512 09/27/13 0502  WBC 3.5* 4.8 4.3 4.1 3.5*  HGB 9.7* 9.9* 10.0* 10.1* 9.1*  HCT 29.7* 30.6* 30.4* 31.1* 28.0*  MCV 84.9 86.0 85.9 86.1 85.6  PLT 45* 56* 45* 49* 43*   Cardiac Enzymes: No results found for this basename: CKTOTAL, CKMB, CKMBINDEX, TROPONINI,  in the last 168 hours BNP (last 3 results) No results found for this basename: PROBNP,  in the last 8760 hours CBG:  Recent Labs Lab 09/26/13 2102 09/27/13 0005 09/27/13 0408 09/27/13 0727 09/27/13 1152  GLUCAP 83 93 87 80 94    Recent Results (from the past 240  hour(s))  SURGICAL PCR SCREEN     Status: None   Collection Time    09/22/13 10:00 PM      Result Value Ref Range Status   MRSA, PCR NEGATIVE  NEGATIVE Final   Staphylococcus aureus NEGATIVE  NEGATIVE Final   Comment:            The Xpert SA Assay (FDA     approved for NASAL specimens     in patients over 21 years of age),     is one component of     a comprehensive surveillance     program.  Test performance has     been validated by [employer] for patients greater     than or equal to 38 year old.     It is not intended      to diagnose infection nor to     guide or monitor treatment.     Studies: US Renal  09/26/2013   CLINICAL DATA:  Hydronephrosis to see improvement since ureteral srent  EXAM: RENAL/URINARY TRACT ULTRASOUND COMPLETE  COMPARISON:  DG RETROGRADE PYELOGRAM dated 09/24/2013; US RENAL dated 09/21/2013  FINDINGS: Right Kidney:  Length: 13.1 cm. Stable benign appearing cyst within the right kidney. No evidence of hydronephrosis.  Left Kidney:  Length: 14.9 cm. Mild hydronephrosis is appreciated within the kidney consistent with decompression, status post stent placement. Previous moderate hydronephrosis. Multiple echogenic foci consistent with calculi in the medullary portion of the kidney. The ureteral stent is partially visualized. Multiple stable bilateral cyst within the left kidney.  Bladder:  Decompressed secondary to Foley catheter insertion  IMPRESSION: Interval decompression of the left kidney with residual mild hydronephrosis.  Medullary calculi left kidney  Bilateral renal cysts   Electronically Signed   By: Margaree Mackintosh M.D.   On: 09/26/2013 15:10    Scheduled Meds: . acetaminophen  650 mg Oral Once  . alteplase  2 mg Intracatheter Once  . diphenhydrAMINE  25 mg Oral Once  . furosemide  20 mg Intravenous Once  . insulin aspart  0-5 Units Subcutaneous QHS  . insulin aspart  0-9 Units Subcutaneous TID WC  . pantoprazole  40 mg Oral Daily   Continuous Infusions: . 0.45 % NaCl with KCl 20 mEq / L 100 mL/hr at 09/27/13 0212  . sodium chloride 100 mL/hr at 09/23/13 8811    Active Problems:   DIABETES MELLITUS, TYPE II   Adenocarcinoma of rectum   Pancytopenia, acquired   Thrombocytopenia   Rectal bleeding   ARF (acute renal failure)   Hydronephrosis   Hydronephrosis of left kidney   GI bleeding    Time spent: 25 mins    Rexene Alberts  [facility] Pager [phone]. If 7PM-7AM, please contact night-coverage at [email or web address], password [facility] 09/27/2013, 2:30 PM  LOS: 7  days

## 2013-09-28 ENCOUNTER — Encounter (HOSPITAL_COMMUNITY): Payer: Self-pay | Admitting: Internal Medicine

## 2013-09-28 DIAGNOSIS — R339 Retention of urine, unspecified: Secondary | ICD-10-CM | POA: Diagnosis present

## 2013-09-28 DIAGNOSIS — R310 Gross hematuria: Secondary | ICD-10-CM | POA: Diagnosis present

## 2013-09-28 LAB — GLUCOSE, CAPILLARY
Glucose-Capillary: 94 mg/dL (ref 70–99)
Glucose-Capillary: 75 mg/dL (ref 70–99)
Glucose-Capillary: 93 mg/dL (ref 70–99)

## 2013-09-28 LAB — PREPARE PLATELET PHERESIS: Unit division: 0

## 2013-09-28 LAB — CBC
HEMATOCRIT: 28.4 % — AB (ref 39.0–52.0)
Hemoglobin: 9.4 g/dL — ABNORMAL LOW (ref 13.0–17.0)
MCH: 28.3 pg (ref 26.0–34.0)
MCHC: 33.1 g/dL (ref 30.0–36.0)
MCV: 85.5 fL (ref 78.0–100.0)
Platelets: 50 10*3/uL — ABNORMAL LOW (ref 150–400)
RBC: 3.32 MIL/uL — ABNORMAL LOW (ref 4.22–5.81)
RDW: 21 % — ABNORMAL HIGH (ref 11.5–15.5)
WBC: 3.6 10*3/uL — ABNORMAL LOW (ref 4.0–10.5)

## 2013-09-28 MED ORDER — HYDROCODONE-ACETAMINOPHEN 5-325 MG PO TABS: 1.0000 | ORAL_TABLET | Freq: Four times a day (QID) | ORAL | Status: DC | PRN

## 2013-09-28 MED ORDER — OMEPRAZOLE 20 MG PO CPDR: 20.0000 mg | DELAYED_RELEASE_CAPSULE | Freq: Every day | ORAL | Status: DC

## 2013-09-28 MED ORDER — HEPARIN (PORCINE) LOCK FLUSH 10 UNIT/ML IV SOLN
10.0000 [IU] | Freq: Once | INTRAVENOUS | Status: AC
Start: 2013-09-28 — End: 2013-09-28
  Administered 2013-09-28: 10 [IU]
  Filled 2013-09-28: qty 5

## 2013-09-28 MED ORDER — OXYCODONE HCL 5 MG PO TABS: 5.0000 mg | ORAL_TABLET | Freq: Four times a day (QID) | ORAL | Status: DC | PRN

## 2013-09-28 NOTE — Progress Notes (Signed)
Patient received discharge instructions along with follow up appointments and prescriptions.Patient verbalized understanding of all instructions. Patient was escorted via wheelchair by staff to vehicle. Patient discharged to home in stable condition.

## 2013-09-28 NOTE — Discharge Summary (Signed)
Physician Discharge Summary  [name] XLK:[id number] DOB: 1950-11-02 DOA: 09/20/2013  PCP: [name] Kilts, MD  Admit date: 09/20/2013 Discharge date: 09/28/2013  Time spent: Greater than 30 minutes  Recommendations for Outpatient Follow-up:  1. The patient was instructed to call Dr. Tammi [name] for radiation oncology followup. 2. He was discharged with the indwelling Foley catheter in place. He is to followup with Dr. Michela [name] in 2 weeks.  3. Recommend follow up of CBC at the next outpatient appointment.  Discharge Diagnoses:  1. Recurrent rectal bleeding, secondary to fungating exophytic rectal mass, suspicious for adenocarcinoma per pathology; status post sigmoidoscopy by Drs. Fields and [place]. 2. 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 Status post previous surgery, radiation therapy, and chemotherapy. 3. Left hydronephrosis.to be secondary to external compression from the rectal malignancy. Status post cystoscopy and stent placement on 4/28 per Dr. Michela [name]. 4. Resultant urinary retention secondary to retained blood clots/gross hematuria. 5. Acute renal failure, thought to be secondary to postobstructive. 6. Acute blood loss anemia, status post transfusion of 2 units of packed red blood cells. 7. 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 Status post platelet transfusions x2.   Discharge Condition: Improved.  Diet recommendation: Heart healthy.  Filed Weights   09/20/13 1651 09/23/13 2150 09/24/13 0636  Weight: 96.7 kg (213 lb 3 oz) 97.523 kg (215 lb) 101.2 kg (223 lb 1.7 oz)    History of present illness:  The patient is a 63 year old man with a history of stage IV colorectal cancer for which he had treatment with radiotherapy, chemotherapy, and initial surgery in 2008. This resulted in a diverting ileostomy. He presented to the hospital on 09/20/2012 with rectal bleeding which included perfuse blood clots. He was seen by a  gastroenterologist Dr. Oneida [name] who performed a flexible sigmoidoscopy and found a near circumferential fungating mass with friable surfaces in the rectum; actively oozing; clots seen in the lumen and aspirated; multiple biopsies taken; and thermal therapy  applied to try to achieve hemostasis. His initial lab data revealed a hemoglobin of 9.3, platelet count of 41, hemoglobin of 9.3, creatinine of 2.17 , and potassium of 3.5. He was admitted for further evaluation and management.   Hospital Course:  This is a very pleasant 63 year old gentleman gentleman with history of colorectal cancer, stage IV with metastases to liver and lungs. He had undergone surgery/radiation therapy/chemotherapy. He currently has an ileostomy in his right lower quadrant. The patient presented to the hospital with rectal bleeding. He underwent flexible sigmoidoscopy on admission and was found to have a rectal mass. This was biopsied. He was admitted to the hospital for further observation of bleeding. The rectal beeding tapered down. He was transfused a total of 2 units of PRBCs during his hospitalization. Biopsy results from the initial mass biopsies did not indicate any clear malignancy. Due to its appearance and high suspicion for malignancy, flexible sigmoidoscopy was repeated on 4/29 for repeat biopsy. On admission, the patient was also noted to be in acute renal failure. Renal ultrasound indicated left-sided hydronephrosis. This was likely related to the external compression from his malignancy. He was seen by urology and underwent cystoscopy with stent placement on 4/28.   1.GI bleeding from rectal mass, thought to be secondary to recurrent adenocarcinoma. Patient has a history of stage IV colorectal cancer and had undergone surgery/radiotherapy as well as chemotherapy. He currently has a ileostomy in his right lower quadrant. He presented with a recurrent rectal mass which began to bleed. Patient underwent  flexible sigmoidoscopy on  admission with biopsy of the mass. He was followed by both GI and Oncology during his hospitalization. Interestingly, the initial biopsy results did not indicate any malignancy. Followup evaluation with a proctoscopy/sigmoidoscopy with biopsies was performed on 4/29 by Dr. Adaline [name] revealed a fungating exophytic rectal mass most likely representing recurrent adenocarcinoma. Pathology report stated "suspicious for adenocarcinoma". Oncology and GI recommended that the patient followup with Dr. Tammi [name] for palliative radiation of the rectal mass. Hopefully it will prevent the rectal mass from rebleeding. Dr. Tammi [name] was aware of the patient's hospitalization. The patient was instructed to call his office in the next couple days for confirmation of the followup appointment.  2. Acute on chronic blood loss anemia in the setting of thrombocytopenia. Patient was transfused 2 units of PRBCs. Posttransfusion hemoglobin had been stable, but had decreased 1 g over the past 48 hours. His hemoglobin was 9.4 at the time of discharge.  3. Thrombocytopenia, chronic, likely related to chemotherapy/radiation. He was transfused platelets on 4/28. In the setting of 2 sources of bleeding, he was given another platelet transfusion on 09/27/13. This was Ddscussed with oncology. His platelet count was 50,000 at discharge.  4.Gross hematuria, Left hydronephrosis; Status post cystoscopy, Left ureteral stent placement, and Removal of small bladder calculi on 4/28 per Dr. Michela [name]. For evaluation of acute renal failure and gross hematuria, a renal ultrasound was ordered. It revealed left hydronephrosis and hydroureter suggesting distal obstruction and left nephrolithiasis. Dr. Michela [name] was consulted and proceeded with cystoscopy, left ureteral stent placement and removal of small bladder calculi. Following the procedure, the patient had continued gross hematuria. He was hydrated throughout the entire hospitalization. The Foley catheter was  removed for a voiding trial, but the patient was unable to void secondary to multiple blood clots. The Foley catheter was reinserted and left in until Dr. Michela [name] follows up with the patient in his office. Followup renal ultrasound on 4/30 revealed interval decompression of the left kidney with residual mild hydronephrosis, medullary calculi in the left kidney, and bilateral renal cysts.  5.Acute renal failure, likely post obstructive in etiolog disease. The patient may have underlying chronic renal disease, possibly stage II or 3. His creatinine was 1.44 on 09/10/13. On admission, it was 2.17. With treatment of hydronephrosis, IV fluids, and supportive treatment, his creatinine improved. It was 1.8 at the time of discharge.   6.Type 2 Diabetes. He barely needed any insulin, though sliding scale NovoLog was ordered. His diabetes is treated with diet only essentially.   7.Hypokalemia. He was supplemented and repleted with potassium chloride orally and gently and IV fluids. His serum potassium improved to 3.9.      Procedures: 4/29:Multiple biopsies were taken for the pathologist, at least 6. The  patient probably lost 10-15 mL of blood acutely with these maneuvers,  but otherwise tolerated the procedure well.  IMPRESSION: Fungating exophytic rectal mass most likely representing  recurrent adenocarcinoma status post biopsy.   4/28: CYSTOSCOPY WITH LEFT RETROGRADE PYELOGRAM; LEFT URETERAL STENT PLACEMENT; REMOVAL OF SMALL BLADDER CALCULI (Left)   4/24: Flex sig: RECTAL BLEEDING DUE TO Near circumferential mass in the rectum   Consultations:  Gastroenterology  Oncology  Urology  Discharge Exam: Filed Vitals:   09/28/13 0410  BP: 127/56  Pulse: 68  Temp: 98 F (36.7 C)  Resp: 20    General: Pleasant 63 year old Caucasian man laying in bed, in no acute distress. Cardiovascular: S1, S2, with no murmurs rubs or gallops. Respiratory: Clear to auscultation bilaterally. Abdomen:  Soft,  mildly obese, nontender, nondistended. Ileostomy in the right lower quadrant with semisolid stool, no melena or bright red blood. GU: Indwelling Foley catheter draining slightly bloody urine; no blood clots.  Discharge Instructions You were cared for by a hospitalist during your hospital stay. If you have any questions about your discharge medications or the care you received while you were in the hospital after you are discharged, you can call the unit and asked to speak with the hospitalist on call if the hospitalist that took care of you is not available. Once you are discharged, your primary care physician will handle any further medical issues. Please note that NO REFILLS for any discharge medications will be authorized once you are discharged, as it is imperative that you return to your primary care physician (or establish a relationship with a primary care physician if you do not have one) for your aftercare needs so that they can reassess your need for medications and monitor your lab values.  Discharge Orders   Future Appointments Provider Department Dept Phone   10/08/2013 2:00 PM [place] [phone]   10/08/2013 2:30 PM [place] [phone]   10/17/2013 1:30 PM [facility] [phone]   11/13/2013 10:00 AM Baird Cancer, PA-C [facility] [phone]   Future Orders Complete By Expires   Diet - low sodium heart healthy  As directed    Increase activity slowly  As directed        Medication List         cyanocobalamin 1000 MCG/ML injection  Commonly known as:  (VITAMIN B-12)  Inject 1,000 mcg into the muscle every 30 (thirty) days.     HYDROcodone-acetaminophen 5-325 MG per tablet  Commonly known as:  NORCO/VICODIN  Take 1-2 tablets by mouth every 6 (six) hours as needed.     lidocaine-prilocaine cream  Commonly known as:  EMLA  Apply 1 application  topically as needed. Apply quarter sized amount over port-a-cath site one hour prior to chemotherapy appointment.     omeprazole 20 MG capsule  Commonly known as:  PRILOSEC  Take 1 capsule (20 mg total) by mouth daily.     TYLENOL 325 MG tablet  Generic drug:  acetaminophen  Take 325 mg by mouth every 4 (four) hours as needed.       Allergies  Allergen Reactions  . Daypro [Oxaprozin] Nausea And Vomiting       Follow-up Information   Follow up with Tyler Pita A, MD. Schedule an appointment as soon as possible for a visit in 3 days. (Call to reschedule follow up with Dr. Tammi [name] to be seen next week)    Specialty:  Radiation Oncology   Contact information:   [place] [id number] [phone]       Follow up with [facility]. Schedule an appointment as soon as possible for a visit in 2 weeks.   Contact information:   [address] [place] [address]       Follow up with Marissa Nestle, MD On 10/10/2013. (at 3:00 pm)    Specialty:  Urology   Contact information:   [address] [place] [address] [phone]        The results of significant diagnostics from this hospitalization (including imaging, microbiology, ancillary and laboratory) are listed below for reference.    Significant Diagnostic Studies: Ct Chest W Contrast  09/03/2013   CLINICAL DATA:  Metastatic rectal carcinoma. Left flank pain. Restaging.  EXAM: CT CHEST, ABDOMEN, AND PELVIS WITH CONTRAST  TECHNIQUE: Multidetector CT imaging of the chest, abdomen and pelvis was performed following the standard protocol during bolus administration of intravenous contrast.  CONTRAST:  115mL OMNIPAQUE IOHEXOL 300 MG/ML  SOLN  COMPARISON:  US RENAL dated 05/01/2013; CT ABD - PELV W/ CM dated 04/18/2013; CT ABD - PELV W/ CM dated 01/14/2013  FINDINGS: CT CHEST FINDINGS  Left subclavian Port-A-Cath tip appears unchanged at the SVC right atrial junction. There  are no enlarged mediastinal, hilar or axillary lymph nodes. Sebaceous cyst in the upper right back is unchanged.  There is stable mild atherosclerosis of the coronary arteries, aorta and great vessels. There is no significant pleural or pericardial effusion. The heart size is normal. Bilateral gynecomastia is noted.  There is stable scarring in the right upper lobe. The previously demonstrated 5 mm nodule medially in the right upper lobe is smaller and less well-defined on the axial images. No new or enlarging pulmonary nodules identified.  There are no worrisome osseous findings.  CT ABDOMEN AND PELVIS FINDINGS  Mild contour irregularity of the liver appears stable. There are no focal liver lesions. There is stable splenomegaly with a stable possible infarct posteriorly in the spleen on image 55. The gallbladder is surgically absent. There is no biliary dilatation. The pancreas appears normal.  There is no adrenal mass. Multiple bilateral renal cysts are stable. There are bilateral renal calculi with a 1.7 cm calculus in the left renal pelvis. Dilatation of the left renal pelvis appears progressive and there is delayed contrast excretion from the left kidney. Both kidneys demonstrate mild cortical thinning.  The left ureter appears dilated into the pelvis where there is increasing oval soft tissue adjacent to the rectal suture line. This measures 3.9 x 3.0 cm on image 96 and is concerning for local recurrence with resulting ureteral obstruction. Small perirectal lymph nodes and perirectal soft tissue stranding are stable. The bladder, prostate gland and seminal vesicles appear stable. There is a limited fat plane between the suspected local recurrence and the bladder, although no gross bladder invasion.  No ascites or other abdominal lymphadenopathy is seen. The stomach and small bowel appear stable with parastomal herniation of small bowel surrounding the right lower quadrant ileostomy  Chronic thoracolumbar  compression deformities appear stable. There are no worrisome osseous findings.  IMPRESSION: 1. Increased soft tissue density adjacent to the rectal suture line worrisome for local recurrence of rectal adenocarcinoma. This is supported by apparent partial obstruction of the distal left ureter. 2. No other signs of metastatic disease within the abdomen or pelvis. 3. Previously demonstrated right upper lobe pulmonary nodule is less well-defined and smaller, consistent with a treated lesion. 4. Stable bilateral renal calculi. Calculus in the left renal pelvis does not appear to be obstructing the UPJ with the patient supine. 5. Stable parastomal herniation of small bowel surrounding the ileostomy.   Electronically Signed   By: Camie Patience M.D.   On: 09/03/2013 17:41   Ct Abdomen Pelvis W Contrast  09/03/2013   CLINICAL DATA:  Metastatic rectal carcinoma. Left flank pain. Restaging.  EXAM: CT CHEST, ABDOMEN, AND PELVIS WITH CONTRAST  TECHNIQUE: Multidetector CT imaging of the chest, abdomen and pelvis was performed following the standard protocol during bolus administration of intravenous contrast.  CONTRAST:  138mL OMNIPAQUE IOHEXOL 300 MG/ML  SOLN  COMPARISON:  US RENAL dated 05/01/2013; CT ABD -  PELV W/ CM dated 04/18/2013; CT ABD - PELV W/ CM dated 01/14/2013  FINDINGS: CT CHEST FINDINGS  Left subclavian Port-A-Cath tip appears unchanged at the SVC right atrial junction. There are no enlarged mediastinal, hilar or axillary lymph nodes. Sebaceous cyst in the upper right back is unchanged.  There is stable mild atherosclerosis of the coronary arteries, aorta and great vessels. There is no significant pleural or pericardial effusion. The heart size is normal. Bilateral gynecomastia is noted.  There is stable scarring in the right upper lobe. The previously demonstrated 5 mm nodule medially in the right upper lobe is smaller and less well-defined on the axial images. No new or enlarging pulmonary nodules identified.   There are no worrisome osseous findings.  CT ABDOMEN AND PELVIS FINDINGS  Mild contour irregularity of the liver appears stable. There are no focal liver lesions. There is stable splenomegaly with a stable possible infarct posteriorly in the spleen on image 55. The gallbladder is surgically absent. There is no biliary dilatation. The pancreas appears normal.  There is no adrenal mass. Multiple bilateral renal cysts are stable. There are bilateral renal calculi with a 1.7 cm calculus in the left renal pelvis. Dilatation of the left renal pelvis appears progressive and there is delayed contrast excretion from the left kidney. Both kidneys demonstrate mild cortical thinning.  The left ureter appears dilated into the pelvis where there is increasing oval soft tissue adjacent to the rectal suture line. This measures 3.9 x 3.0 cm on image 96 and is concerning for local recurrence with resulting ureteral obstruction. Small perirectal lymph nodes and perirectal soft tissue stranding are stable. The bladder, prostate gland and seminal vesicles appear stable. There is a limited fat plane between the suspected local recurrence and the bladder, although no gross bladder invasion.  No ascites or other abdominal lymphadenopathy is seen. The stomach and small bowel appear stable with parastomal herniation of small bowel surrounding the right lower quadrant ileostomy  Chronic thoracolumbar compression deformities appear stable. There are no worrisome osseous findings.  IMPRESSION: 1. Increased soft tissue density adjacent to the rectal suture line worrisome for local recurrence of rectal adenocarcinoma. This is supported by apparent partial obstruction of the distal left ureter. 2. No other signs of metastatic disease within the abdomen or pelvis. 3. Previously demonstrated right upper lobe pulmonary nodule is less well-defined and smaller, consistent with a treated lesion. 4. Stable bilateral renal calculi. Calculus in the left  renal pelvis does not appear to be obstructing the UPJ with the patient supine. 5. Stable parastomal herniation of small bowel surrounding the ileostomy.   Electronically Signed   By: Camie Patience M.D.   On: 09/03/2013 17:41   Dg Retrograde Pyelogram  09/24/2013   CLINICAL DATA:  Left hydronephrosis, colon carcinoma.  EXAM: RETROGRADE PYELOGRAM  COMPARISON:  Ultrasound 09/21/2013 and earlier studies  FINDINGS: A series of images from retrograde pyelography demonstrates fixed irregular narrowing in the distal ureter at the level of the lower SI joint. The proximal ureter is distended. Later images document placement of a double-J ureteral stent, projecting in expected location.  IMPRESSION: Distal left ureteral obstruction, with double-J stent placement.   Electronically Signed   By: Arne Cleveland M.D.   On: 09/24/2013 12:30   US Renal  09/26/2013   CLINICAL DATA:  Hydronephrosis to see improvement since ureteral srent  EXAM: RENAL/URINARY TRACT ULTRASOUND COMPLETE  COMPARISON:  DG RETROGRADE PYELOGRAM dated 09/24/2013; US RENAL dated 09/21/2013  FINDINGS: Right Kidney:  Length: 13.1 cm. Stable benign appearing cyst within the right kidney. No evidence of hydronephrosis.  Left Kidney:  Length: 14.9 cm. Mild hydronephrosis is appreciated within the kidney consistent with decompression, status post stent placement. Previous moderate hydronephrosis. Multiple echogenic foci consistent with calculi in the medullary portion of the kidney. The ureteral stent is partially visualized. Multiple stable bilateral cyst within the left kidney.  Bladder:  Decompressed secondary to Foley catheter insertion  IMPRESSION: Interval decompression of the left kidney with residual mild hydronephrosis.  Medullary calculi left kidney  Bilateral renal cysts   Electronically Signed   By: Margaree Mackintosh M.D.   On: 09/26/2013 15:10   US Renal  09/21/2013   CLINICAL DATA:  Acute renal failure, rectal carcinoma.  EXAM: RENAL/URINARY TRACT  ULTRASOUND COMPLETE  COMPARISON:  CT 09/03/2013  FINDINGS: Right Kidney:  Length: 12.9. No hydronephrosis. Several anechoic cysts in the upper pole.  Left Kidney:  Length: 14.5. Moderate hydronephrosis of the left renal collecting system. The left ureter is dilated proximally. These findings are similar to comparison CT. There is a 1.2 cm calculus in the mid to lower pole of the left kidney.  Bladder:  Appears normal.  Ureteral jets not identified.  IMPRESSION: 1. Left hydronephrosis and hydroureter suggests distal obstruction. These findings are similar to CT 09/03/2013. 2. Left nephrolithiasis. 3. Bladder appears normal.   Electronically Signed   By: Suzy Bouchard M.D.   On: 09/21/2013 14:51    Microbiology: Recent Results (from the past 240 hour(s))  SURGICAL PCR SCREEN     Status: None   Collection Time    09/22/13 10:00 PM      Result Value Ref Range Status   MRSA, PCR NEGATIVE  NEGATIVE Final   Staphylococcus aureus NEGATIVE  NEGATIVE Final   Comment:            The Xpert SA Assay (FDA     approved for NASAL specimens     in patients over 12 years of age),     is one component of     a comprehensive surveillance     program.  Test performance has     been validated by [employer] for patients greater     than or equal to 2 year old.     It is not intended     to diagnose infection nor to     guide or monitor treatment.     Labs: Basic Metabolic Panel:  Recent Labs Lab 09/23/13 0444 09/24/13 0445 09/25/13 0515 09/26/13 0512 09/27/13 0502  NA 146 145 145 142 144  K 3.7 3.6* 3.7 3.4* 3.9  CL 113* 113* 110 107 110  CO2 22 21 23 24 23   GLUCOSE 79 100* 83 124* 80  BUN 11 11 9 9 10   CREATININE 2.33* 2.13* 2.01* 1.84* 1.84*  CALCIUM 8.5 8.6 8.7 8.6 8.6   Liver Function Tests: No results found for this basename: AST, ALT, ALKPHOS, BILITOT, PROT, ALBUMIN,  in the last 168 hours No results found for this basename: LIPASE, AMYLASE,  in the last 168 hours No results  found for this basename: AMMONIA,  in the last 168 hours CBC:  Recent Labs Lab 09/25/13 0940 09/26/13 0512 09/27/13 0502 09/27/13 2020 09/28/13 0625  WBC 4.3 4.1 3.5* 4.4 3.6*  NEUTROABS  --   --   --  3.4  --   HGB 10.0* 10.1* 9.1* 9.7* 9.4*  HCT 30.4* 31.1* 28.0* 29.9* 28.4*  MCV 85.9 86.1 85.6 85.2 85.5  PLT 45* 49* 43* 54* 50*   Cardiac Enzymes: No results found for this basename: CKTOTAL, CKMB, CKMBINDEX, TROPONINI,  in the last 168 hours BNP: BNP (last 3 results) No results found for this basename: PROBNP,  in the last 8760 hours CBG:  Recent Labs Lab 09/27/13 2131 09/27/13 2348 09/28/13 0408 09/28/13 0759 09/28/13 1159  GLUCAP 129* 88 94 75 93       Signed:  Rexene Alberts  [facility] 09/28/2013, 12:32 PM

## 2013-09-30 ENCOUNTER — Telehealth (HOSPITAL_COMMUNITY): Payer: Self-pay | Admitting: Oncology

## 2013-09-30 ENCOUNTER — Telehealth: Payer: Self-pay

## 2013-09-30 ENCOUNTER — Telehealth: Payer: Self-pay | Admitting: Internal Medicine

## 2013-09-30 LAB — STONE ANALYSIS: STONE WEIGHT KSTONE: 0.015 g

## 2013-09-30 NOTE — Telephone Encounter (Signed)
Thanks [name],  We had set up simulation Friday because of his hospital admission/bleeding, but, then cancelled since his 1st biopsy was still pending.  His 2nd biopsy also appears to be suspicious but not necessarily diagnostic for malignancy.  I think we need to decide whether he has a local recurrence requiring palliative re-irradiation.  Once that is established, I am happy to work him in on an urgent basis to prevent further bleeding.    Matt

## 2013-09-30 NOTE — Telephone Encounter (Signed)
Last flex sig was done by RMR on 09/25/13. Routing to RMR

## 2013-09-30 NOTE — Telephone Encounter (Signed)
Patient called and reported that he missed his Rad Onc appt with Dr. [name] on Friday because he was still admitted to the [facility].  He explains that he called Dr. Johny Shears office today and was unable to get an appointment in the near future.  He called me to make sure he was to be seen by Dr. [name] sooner than October.  I will touch based with Dr. [name] regarding this.   Baird Cancer 09/30/2013

## 2013-09-30 NOTE — Telephone Encounter (Signed)
He is my patient. I will make contact with him.

## 2013-09-30 NOTE — Telephone Encounter (Signed)
Pt is calling for the results from his Proctoscopy/sigmoidoscopy.  I am not sure who's patient he is [facility] did one and [facility] did one.Please advise

## 2013-10-03 ENCOUNTER — Telehealth (HOSPITAL_COMMUNITY): Payer: Self-pay

## 2013-10-03 NOTE — Telephone Encounter (Signed)
Message copied by Mellissa Kohut on Thu Oct 03, 2013  5:33 PM ------      Message from: Baird Cancer      Created: Thu Oct 03, 2013  4:41 PM       FMLA papers are ready for wife to pick-up.  They are in drawer. ------

## 2013-10-03 NOTE — Telephone Encounter (Signed)
Rod Holler notified and she will pick up on 10/04/13.

## 2013-10-07 ENCOUNTER — Ambulatory Visit: Payer: Managed Care, Other (non HMO)

## 2013-10-07 ENCOUNTER — Ambulatory Visit: Payer: Managed Care, Other (non HMO) | Admitting: Radiation Oncology

## 2013-10-08 ENCOUNTER — Encounter (HOSPITAL_COMMUNITY): Payer: Managed Care, Other (non HMO) | Attending: Oncology

## 2013-10-08 ENCOUNTER — Encounter (HOSPITAL_COMMUNITY): Payer: Managed Care, Other (non HMO)

## 2013-10-08 VITALS — BP 105/56 | HR 73 | Resp 18

## 2013-10-08 DIAGNOSIS — E538 Deficiency of other specified B group vitamins: Secondary | ICD-10-CM | POA: Insufficient documentation

## 2013-10-08 DIAGNOSIS — C20 Malignant neoplasm of rectum: Secondary | ICD-10-CM | POA: Insufficient documentation

## 2013-10-08 LAB — COMPREHENSIVE METABOLIC PANEL
ALT: 20 U/L (ref 0–53)
AST: 34 U/L (ref 0–37)
Albumin: 3.1 g/dL — ABNORMAL LOW (ref 3.5–5.2)
Alkaline Phosphatase: 129 U/L — ABNORMAL HIGH (ref 39–117)
BUN: 18 mg/dL (ref 6–23)
CO2: 24 meq/L (ref 19–32)
Calcium: 9.4 mg/dL (ref 8.4–10.5)
Chloride: 104 mEq/L (ref 96–112)
Creatinine, Ser: 2.52 mg/dL — ABNORMAL HIGH (ref 0.50–1.35)
GFR, EST AFRICAN AMERICAN: 30 mL/min — AB (ref >90)
GFR, EST NON AFRICAN AMERICAN: 26 mL/min — AB (ref >90)
GLUCOSE: 130 mg/dL — AB (ref 70–99)
POTASSIUM: 4.1 meq/L (ref 3.7–5.3)
SODIUM: 139 meq/L (ref 137–147)
TOTAL PROTEIN: 6.9 g/dL (ref 6.0–8.3)
Total Bilirubin: 2.1 mg/dL — ABNORMAL HIGH (ref 0.3–1.2)

## 2013-10-08 LAB — CBC WITH DIFFERENTIAL/PLATELET
Basophils Absolute: 0 10*3/uL (ref 0.0–0.1)
Basophils Relative: 0 % (ref 0–1)
Eosinophils Absolute: 0 10*3/uL (ref 0.0–0.7)
Eosinophils Relative: 0 % (ref 0–5)
HCT: 30.9 % — ABNORMAL LOW (ref 39.0–52.0)
Hemoglobin: 10.4 g/dL — ABNORMAL LOW (ref 13.0–17.0)
LYMPHS ABS: 0.4 10*3/uL — AB (ref 0.7–4.0)
LYMPHS PCT: 5 % — AB (ref 12–46)
MCH: 29.1 pg (ref 26.0–34.0)
MCHC: 33.7 g/dL (ref 30.0–36.0)
MCV: 86.3 fL (ref 78.0–100.0)
Monocytes Absolute: 1.1 10*3/uL — ABNORMAL HIGH (ref 0.1–1.0)
Monocytes Relative: 13 % — ABNORMAL HIGH (ref 3–12)
NEUTROS PCT: 83 % — AB (ref 43–77)
Neutro Abs: 7.2 10*3/uL (ref 1.7–7.7)
PLATELETS: 52 10*3/uL — AB (ref 150–400)
RBC: 3.58 MIL/uL — AB (ref 4.22–5.81)
RDW: 20.5 % — ABNORMAL HIGH (ref 11.5–15.5)
WBC: 8.7 10*3/uL (ref 4.0–10.5)

## 2013-10-08 MED ORDER — HEPARIN SOD (PORK) LOCK FLUSH 100 UNIT/ML IV SOLN
INTRAVENOUS | Status: AC
  Filled 2013-10-08: qty 5

## 2013-10-08 MED ORDER — SODIUM CHLORIDE 0.9 % IJ SOLN
10.0000 mL | INTRAMUSCULAR | Status: DC | PRN
  Administered 2013-10-08: 10 mL via INTRAVENOUS

## 2013-10-08 MED ORDER — CYANOCOBALAMIN 1000 MCG/ML IJ SOLN
INTRAMUSCULAR | Status: AC
  Filled 2013-10-08: qty 1

## 2013-10-08 MED ORDER — HEPARIN SOD (PORK) LOCK FLUSH 100 UNIT/ML IV SOLN
500.0000 [IU] | Freq: Once | INTRAVENOUS | Status: AC
  Administered 2013-10-08: 500 [IU] via INTRAVENOUS

## 2013-10-08 MED ORDER — CYANOCOBALAMIN 1000 MCG/ML IJ SOLN
1000.0000 ug | Freq: Once | INTRAMUSCULAR | Status: AC
  Administered 2013-10-08: 1000 ug via INTRAMUSCULAR

## 2013-10-08 NOTE — Progress Notes (Signed)
[name] please see previous appt time with port flush for documetnation

## 2013-10-08 NOTE — Progress Notes (Signed)
[name] presented for Portacath access and flush.  Proper placement of portacath confirmed by CXR.  Portacath located left  chest wall accessed with  H 20 needle.  Good blood return present. Portacath flushed with 51ml NS and 500U/62ml Heparin and needle removed intact.  Procedure tolerated well and without incident. Labs drawn from port. Onyx R [name]'s reason for visit today is for an injection and labs as scheduled per MD orders. [name] also received B12   per MD orders; see [facility] for administration details.  [name] tolerated all procedures well and without incident; questions were answered and patient was discharged.

## 2013-10-09 LAB — CEA: CEA: 9.5 ng/mL — ABNORMAL HIGH (ref 0.0–5.0)

## 2013-10-17 ENCOUNTER — Ambulatory Visit (INDEPENDENT_AMBULATORY_CARE_PROVIDER_SITE_OTHER): Payer: Managed Care, Other (non HMO) | Admitting: Gastroenterology

## 2013-10-17 ENCOUNTER — Encounter: Payer: Self-pay | Admitting: Gastroenterology

## 2013-10-17 VITALS — BP 122/71 | HR 81 | Temp 98.3°F | Ht 70.0 in | Wt 205.4 lb

## 2013-10-17 DIAGNOSIS — K625 Hemorrhage of anus and rectum: Secondary | ICD-10-CM

## 2013-10-17 DIAGNOSIS — C20 Malignant neoplasm of rectum: Secondary | ICD-10-CM

## 2013-10-17 DIAGNOSIS — I2510 Atherosclerotic heart disease of native coronary artery without angina pectoris: Secondary | ICD-10-CM

## 2013-10-17 NOTE — Patient Instructions (Signed)
1. I will touch base with you next week after I discuss your future care with Dr. Gala Romney.

## 2013-10-17 NOTE — Progress Notes (Signed)
Primary Care Physician: [name], MD  Primary Gastroenterologist:  Garfield Cornea, MD   Chief Complaint  Patient presents with  . Colonoscopy    HPI: [name] is a 63 y.o. male here for follow up. 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   Second week of palliative? radiation. I don't have records regarding radiation treatments. Bleeding much improved but occasional blood in stool. Recently had urinary catheter removed. On cipro for UTI. Ostomy output normal. No melena, brbpr. Getting a catch in left side associated with movement. Appetite stable. No vomiting.    CT in 08/2013 showed mild contour irregularity of liver, stable splenomegaly. Chronic thrombocytopenia. ?cirrhosis.   Current Outpatient Prescriptions  Medication Sig Dispense Refill  . acetaminophen (TYLENOL) 325 MG tablet Take 325 mg by mouth every 4 (four) hours as needed.      . ciprofloxacin (CIPRO) 500 MG tablet Take 500 mg by mouth 2 (two) times daily.       . cyanocobalamin (,VITAMIN B-12,) 1000 MCG/ML injection Inject 1,000 mcg into the muscle every 30 (thirty) days.       Marland Kitchen HYDROcodone-acetaminophen (NORCO/VICODIN) 5-325 MG per tablet Take 1-2 tablets by mouth every 6 (six) hours as needed.  60 tablet  0  . omeprazole (PRILOSEC) 20 MG capsule Take 1 capsule (20 mg total) by mouth daily.  30 capsule  2   No current facility-administered medications for this visit.   Facility-Administered Medications Ordered in Other Visits  Medication Dose Route Frequency Provider Last Rate Last Dose  . 0.9 %  sodium chloride infusion   Intravenous Continuous Pieter Partridge, MD 20 mL/hr at 07/17/12 1113    . heparin lock flush 100 unit/mL  500 Units Intravenous Once Pieter Partridge, MD      . sodium chloride 0.9 % injection 10 mL  10 mL Intravenous PRN Pieter Partridge, MD   10 mL at 07/17/12 1113    Allergies  as of 10/17/2013 - Review Complete 10/17/2013  Allergen Reaction Noted  . Daypro [oxaprozin] Nausea And Vomiting 07/07/2010    ROS:  General: Negative for anorexia, weight loss, fever, chills, fatigue, weakness. ENT: Negative for hoarseness, difficulty swallowing , nasal congestion. CV: Negative for chest pain, angina, palpitations, dyspnea on exertion, peripheral edema.  Respiratory: Negative for dyspnea at rest, dyspnea on exertion, cough, sputum, wheezing.  GI: See history of present illness. GU:  Negative for dysuria, hematuria, urinary incontinence, urinary frequency, nocturnal urination.  Endo: Negative for unusual weight change.    Physical Examination:   BP 122/71  Pulse 81  Temp(Src) 98.3 F (36.8 C) (Oral)  Ht 5\' 10"  (1.778 m)  Wt 205 lb 6.4 oz (93.169 kg)  BMI 29.47 kg/m2  General: Well-nourished, well-developed in no acute distress.  Eyes: No icterus. Mouth: Oropharyngeal mucosa moist and pink , no lesions erythema or exudate. Lungs: Clear to auscultation bilaterally.  Heart: Regular rate and rhythm, no murmurs rubs or gallops.  Abdomen: Bowel sounds are normal, nontender, nondistended, no hepatosplenomegaly or masses, no abdominal bruits or hernia , no rebound or guarding.  Ostomy in right lower abdomen with loose brown stool. Extremities: No lower extremity edema. No clubbing or deformities. Neuro: Alert and oriented x 4   Skin: Warm and dry, no jaundice.   Psych: Alert and cooperative, normal mood and affect.  Labs:  Lab Results  Component Value Date   WBC 8.7  10/08/2013   HGB 10.4* 10/08/2013   HCT 30.9* 10/08/2013   MCV 86.3 10/08/2013   PLT 52* 10/08/2013   Lab Results  Component Value Date   CREATININE 2.52* 10/08/2013   BUN 18 10/08/2013   NA 139 10/08/2013   K 4.1 10/08/2013   CL 104 10/08/2013   CO2 24 10/08/2013   Lab Results  Component Value Date   ALT 20 10/08/2013   AST 34 10/08/2013   ALKPHOS 129* 10/08/2013   BILITOT 2.1* 10/08/2013   Lab  Results  Component Value Date   CEA 9.5* 10/08/2013    Imaging Studies: Dg Retrograde Pyelogram  09/24/2013   CLINICAL DATA:  Left hydronephrosis, colon carcinoma.  EXAM: RETROGRADE PYELOGRAM  COMPARISON:  Ultrasound 09/21/2013 and earlier studies  FINDINGS: A series of images from retrograde pyelography demonstrates fixed irregular narrowing in the distal ureter at the level of the lower SI joint. The proximal ureter is distended. Later images document placement of a double-J ureteral stent, projecting in expected location.  IMPRESSION: Distal left ureteral obstruction, with double-J stent placement.   Electronically Signed   By: Arne Cleveland M.D.   On: 09/24/2013 12:30   US Renal  09/26/2013   CLINICAL DATA:  Hydronephrosis to see improvement since ureteral srent  EXAM: RENAL/URINARY TRACT ULTRASOUND COMPLETE  COMPARISON:  DG RETROGRADE PYELOGRAM dated 09/24/2013; US RENAL dated 09/21/2013  FINDINGS: Right Kidney:  Length: 13.1 cm. Stable benign appearing cyst within the right kidney. No evidence of hydronephrosis.  Left Kidney:  Length: 14.9 cm. Mild hydronephrosis is appreciated within the kidney consistent with decompression, status post stent placement. Previous moderate hydronephrosis. Multiple echogenic foci consistent with calculi in the medullary portion of the kidney. The ureteral stent is partially visualized. Multiple stable bilateral cyst within the left kidney.  Bladder:  Decompressed secondary to Foley catheter insertion  IMPRESSION: Interval decompression of the left kidney with residual mild hydronephrosis.  Medullary calculi left kidney  Bilateral renal cysts   Electronically Signed   By: Margaree Mackintosh M.D.   On: 09/26/2013 15:10   US Renal  09/21/2013   CLINICAL DATA:  Acute renal failure, rectal carcinoma.  EXAM: RENAL/URINARY TRACT ULTRASOUND COMPLETE  COMPARISON:  CT 09/03/2013  FINDINGS: Right Kidney:  Length: 12.9. No hydronephrosis. Several anechoic cysts in the upper pole.   Left Kidney:  Length: 14.5. Moderate hydronephrosis of the left renal collecting system. The left ureter is dilated proximally. These findings are similar to comparison CT. There is a 1.2 cm calculus in the mid to lower pole of the left kidney.  Bladder:  Appears normal.  Ureteral jets not identified.  IMPRESSION: 1. Left hydronephrosis and hydroureter suggests distal obstruction. These findings are similar to CT 09/03/2013. 2. Left nephrolithiasis. 3. Bladder appears normal.   Electronically Signed   By: Suzy Bouchard M.D.   On: 09/21/2013 14:51

## 2013-10-17 NOTE — Assessment & Plan Note (Signed)
Rectal mass with second bx s/o adenocarcinoma but not definitive. Undergoing radiation for bleeding control. Upcoming appointment with oncology. Noticed very little bleeding since radiation started.   Patient may have underlying cirrhosis based on last CT which would explain thrombocytopenia, splenomegaly.   To discuss with Dr. Gala Romney regarding recommendations.

## 2013-10-21 NOTE — Progress Notes (Signed)
I spoke to the patient on the telephone on May 5th  from the office regarding his pathology report on the second sigmoidoscopy. I also sent Dr. [name] an electronic message through epic.  I also spoke to Dr. [name] at a separate meeting face-to-face on May 11. It was my recommendation to both the patient and Dr. 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

## 2013-10-23 NOTE — Progress Notes (Addendum)
Please let patient know that I discussed with Dr. Gala Romney. No set time frame for follow up look at the rectum. I will request records from [facility], Dr. [name] for our chart. Keep follow up with oncology next month as planned.  OV here for f/u with RMR in 8 weeks.    Chelsey: please request records from [facility], Dr. [name], Cancer center. Manuela Schwartz: OV in 8 weeks with RMR.

## 2013-10-23 NOTE — Progress Notes (Signed)
cc'd to pcp 

## 2013-10-24 NOTE — Progress Notes (Signed)
Records are in your cart.

## 2013-10-24 NOTE — Progress Notes (Signed)
Tried to call pt- LMOM 

## 2013-10-24 NOTE — Progress Notes (Signed)
Pt is aware of OV on 7/24 at 9 with RMR and appt card mailed

## 2013-10-24 NOTE — Progress Notes (Signed)
Requested Records from the [facility] in [place].

## 2013-10-25 NOTE — Progress Notes (Signed)
Pt called to see if LSL had talked with RMR. I told him about the follow up appointment in July. He wanted to make sure there was nothing elsa. He said he would call back Monday because his phone would be off while he is in the hospital taking his treatment.

## 2013-10-25 NOTE — Progress Notes (Signed)
Pt aware.

## 2013-10-28 ENCOUNTER — Telehealth: Payer: Self-pay

## 2013-10-28 ENCOUNTER — Telehealth: Payer: Self-pay | Admitting: Internal Medicine

## 2013-10-28 MED ORDER — MESALAMINE 1000 MG RE SUPP: 1000.0000 mg | Freq: Every day | RECTAL | Status: DC

## 2013-10-28 NOTE — Telephone Encounter (Signed)
rx sent has once a day instructions. [place] and corrected that to bid. Spoke with the pharmacist- Morey Hummingbird.

## 2013-10-28 NOTE — Telephone Encounter (Signed)
I have spoken with the pt. See separate phone note.

## 2013-10-28 NOTE — Telephone Encounter (Signed)
Pt called back asking if we have heard from RMR about what he needs to do regarding rectal bleeding. Phone note done earlier per GW. Please advise and call patient ASAP.

## 2013-10-28 NOTE — Telephone Encounter (Signed)
Pt is aware. rx has been sent to the pharmacy.

## 2013-10-28 NOTE — Telephone Encounter (Signed)
Pt is calling today because he is bleeding from his rectal area all of a sudden. It is bright red and it has cloths. No trouble this morning until about an hour ago. No abd pain. He took his last treatment Friday. His call back number is [phone]

## 2013-10-28 NOTE — Telephone Encounter (Signed)
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 Bleeding may get worse before it gets better.  Begin mesalamine 1 g suppositories one per rectum twice a day. Dispense 28  no refills. Would give it 24-48 hours to settle down; if it does not settle down in this period of time or gets worse,  he may need to come to the emergency department.

## 2013-10-29 ENCOUNTER — Other Ambulatory Visit (HOSPITAL_COMMUNITY): Payer: Managed Care, Other (non HMO)

## 2013-10-29 NOTE — Telephone Encounter (Signed)
Noted  

## 2013-10-30 NOTE — Telephone Encounter (Signed)
noted 

## 2013-11-06 ENCOUNTER — Other Ambulatory Visit (HOSPITAL_COMMUNITY): Payer: Self-pay | Admitting: Oncology

## 2013-11-06 ENCOUNTER — Telehealth (HOSPITAL_COMMUNITY): Payer: Self-pay | Admitting: Oncology

## 2013-11-06 DIAGNOSIS — C20 Malignant neoplasm of rectum: Secondary | ICD-10-CM

## 2013-11-06 DIAGNOSIS — M25511 Pain in right shoulder: Secondary | ICD-10-CM

## 2013-11-06 DIAGNOSIS — J069 Acute upper respiratory infection, unspecified: Secondary | ICD-10-CM

## 2013-11-06 MED ORDER — HYDROCODONE-ACETAMINOPHEN 5-325 MG PO TABS: 1.0000 | ORAL_TABLET | Freq: Four times a day (QID) | ORAL | Status: DC | PRN

## 2013-11-06 MED ORDER — AZITHROMYCIN 250 MG PO TABS: ORAL_TABLET | ORAL | Status: DC

## 2013-11-06 NOTE — Telephone Encounter (Signed)
Pain medication is ready for pick-up.  I called in a Z-Pak to [facility]

## 2013-11-13 ENCOUNTER — Encounter (HOSPITAL_BASED_OUTPATIENT_CLINIC_OR_DEPARTMENT_OTHER): Payer: Managed Care, Other (non HMO)

## 2013-11-13 ENCOUNTER — Ambulatory Visit (HOSPITAL_COMMUNITY): Payer: Managed Care, Other (non HMO) | Admitting: Oncology

## 2013-11-13 ENCOUNTER — Encounter (HOSPITAL_COMMUNITY): Payer: Managed Care, Other (non HMO) | Attending: Oncology

## 2013-11-13 DIAGNOSIS — D539 Nutritional anemia, unspecified: Secondary | ICD-10-CM | POA: Insufficient documentation

## 2013-11-13 DIAGNOSIS — E538 Deficiency of other specified B group vitamins: Secondary | ICD-10-CM

## 2013-11-13 DIAGNOSIS — C20 Malignant neoplasm of rectum: Secondary | ICD-10-CM | POA: Insufficient documentation

## 2013-11-13 DIAGNOSIS — C7800 Secondary malignant neoplasm of unspecified lung: Secondary | ICD-10-CM | POA: Insufficient documentation

## 2013-11-13 DIAGNOSIS — D500 Iron deficiency anemia secondary to blood loss (chronic): Secondary | ICD-10-CM | POA: Insufficient documentation

## 2013-11-13 LAB — COMPREHENSIVE METABOLIC PANEL
ALT: 40 U/L (ref 0–53)
AST: 41 U/L — ABNORMAL HIGH (ref 0–37)
Albumin: 2.7 g/dL — ABNORMAL LOW (ref 3.5–5.2)
Alkaline Phosphatase: 119 U/L — ABNORMAL HIGH (ref 39–117)
BUN: 26 mg/dL — AB (ref 6–23)
CALCIUM: 8.6 mg/dL (ref 8.4–10.5)
CO2: 20 mEq/L (ref 19–32)
CREATININE: 2.09 mg/dL — AB (ref 0.50–1.35)
Chloride: 106 mEq/L (ref 96–112)
GFR calc non Af Amer: 33 mL/min — ABNORMAL LOW (ref >90)
GFR, EST AFRICAN AMERICAN: 38 mL/min — AB (ref >90)
GLUCOSE: 84 mg/dL (ref 70–99)
Potassium: 4 mEq/L (ref 3.7–5.3)
SODIUM: 139 meq/L (ref 137–147)
Total Bilirubin: 1.4 mg/dL — ABNORMAL HIGH (ref 0.3–1.2)
Total Protein: 7.1 g/dL (ref 6.0–8.3)

## 2013-11-13 LAB — CBC WITH DIFFERENTIAL/PLATELET
BASOS PCT: 0 % (ref 0–1)
Basophils Absolute: 0 10*3/uL (ref 0.0–0.1)
EOS PCT: 1 % (ref 0–5)
Eosinophils Absolute: 0 10*3/uL (ref 0.0–0.7)
HCT: 23 % — ABNORMAL LOW (ref 39.0–52.0)
HEMOGLOBIN: 7.8 g/dL — AB (ref 13.0–17.0)
Lymphocytes Relative: 10 % — ABNORMAL LOW (ref 12–46)
Lymphs Abs: 0.4 10*3/uL — ABNORMAL LOW (ref 0.7–4.0)
MCH: 30.5 pg (ref 26.0–34.0)
MCHC: 33.9 g/dL (ref 30.0–36.0)
MCV: 89.8 fL (ref 78.0–100.0)
MONOS PCT: 9 % (ref 3–12)
Monocytes Absolute: 0.4 10*3/uL (ref 0.1–1.0)
NEUTROS PCT: 81 % — AB (ref 43–77)
Neutro Abs: 3.5 10*3/uL (ref 1.7–7.7)
Platelets: 48 10*3/uL — ABNORMAL LOW (ref 150–400)
RBC: 2.56 MIL/uL — ABNORMAL LOW (ref 4.22–5.81)
RDW: 19.9 % — ABNORMAL HIGH (ref 11.5–15.5)
Smear Review: DECREASED
WBC: 4.4 10*3/uL (ref 4.0–10.5)

## 2013-11-13 MED ORDER — SODIUM CHLORIDE 0.9 % IJ SOLN
10.0000 mL | INTRAMUSCULAR | Status: DC | PRN
  Administered 2013-11-13: 10 mL via INTRAVENOUS

## 2013-11-13 MED ORDER — CYANOCOBALAMIN 1000 MCG/ML IJ SOLN
1000.0000 ug | Freq: Once | INTRAMUSCULAR | Status: AC
  Administered 2013-11-13: 1000 ug via INTRAMUSCULAR

## 2013-11-13 MED ORDER — HEPARIN SOD (PORK) LOCK FLUSH 100 UNIT/ML IV SOLN
INTRAVENOUS | Status: AC
  Filled 2013-11-13: qty 5

## 2013-11-13 MED ORDER — HEPARIN SOD (PORK) LOCK FLUSH 100 UNIT/ML IV SOLN
500.0000 [IU] | Freq: Once | INTRAVENOUS | Status: AC
  Administered 2013-11-13: 500 [IU] via INTRAVENOUS

## 2013-11-13 MED ORDER — CYANOCOBALAMIN 1000 MCG/ML IJ SOLN
INTRAMUSCULAR | Status: AC
  Filled 2013-11-13: qty 1

## 2013-11-13 NOTE — Progress Notes (Signed)
See other encounter on this day for further.

## 2013-11-13 NOTE — Progress Notes (Signed)
[name] presents today for injection per the provider's orders.  B12 administration without incident; see MAR for injection details.  Patient tolerated procedure well and without incident.  No questions or complaints noted at this time.  [name] presented for Portacath access and flush. Portacath located left chest wall accessed with  H 20 needle.  Good blood return present.  Labs drawn from site. Portacath flushed with 92ml NS and 500U/46ml Heparin and needle removed intact.  Procedure tolerated well and without incident.

## 2013-11-14 LAB — CEA: CEA: 5.7 ng/mL — AB (ref 0.0–5.0)

## 2013-11-15 ENCOUNTER — Other Ambulatory Visit (HOSPITAL_COMMUNITY): Payer: Self-pay | Admitting: Oncology

## 2013-11-15 ENCOUNTER — Encounter (HOSPITAL_BASED_OUTPATIENT_CLINIC_OR_DEPARTMENT_OTHER): Payer: Managed Care, Other (non HMO)

## 2013-11-15 DIAGNOSIS — D539 Nutritional anemia, unspecified: Secondary | ICD-10-CM

## 2013-11-15 LAB — FERRITIN: Ferritin: 62 ng/mL (ref 22–322)

## 2013-11-15 LAB — IRON AND TIBC
IRON: 46 ug/dL (ref 42–135)
Saturation Ratios: 15 % — ABNORMAL LOW (ref 20–55)
TIBC: 311 ug/dL (ref 215–435)
UIBC: 265 ug/dL (ref 125–400)

## 2013-11-15 MED ORDER — SODIUM CHLORIDE 0.9 % IJ SOLN
10.0000 mL | INTRAMUSCULAR | Status: DC | PRN
  Administered 2013-11-15: 10 mL via INTRAVENOUS

## 2013-11-15 MED ORDER — HEPARIN SOD (PORK) LOCK FLUSH 100 UNIT/ML IV SOLN
INTRAVENOUS | Status: AC
  Filled 2013-11-15: qty 5

## 2013-11-15 MED ORDER — HEPARIN SOD (PORK) LOCK FLUSH 100 UNIT/ML IV SOLN
500.0000 [IU] | Freq: Once | INTRAVENOUS | Status: AC
  Administered 2013-11-15: 500 [IU] via INTRAVENOUS

## 2013-11-15 NOTE — Progress Notes (Signed)
[place] Lev[place] presented for Portacath access and flush. Portacath located left chest wall accessed with  H 20 needle.  Good blood return present.  Labs drawn from site. Portacath flushed with 49ml NS and 500U/54ml Heparin and needle removed intact.  Procedure tolerated well and without incident.

## 2013-11-17 ENCOUNTER — Encounter (HOSPITAL_COMMUNITY): Payer: Self-pay | Admitting: Oncology

## 2013-11-17 NOTE — Progress Notes (Signed)
[name], MD [place] [address]  Adenocarcinoma of rectum - Plan: CBC with Differential, Comprehensive metabolic panel, CEA  Secondary malignant neoplasm of right lung - Plan: CBC with Differential, Comprehensive metabolic panel, CEA  Iron deficiency anemia secondary to blood loss (chronic) - Plan: ferumoxytol (FERAHEME) 510 mg in sodium chloride 0.9 % 100 mL IVPB, ferumoxytol (FERAHEME) 510 mg in sodium chloride 0.9 % 100 mL IVPB, CBC with Differential, Iron and TIBC, Ferritin  Malabsorption of iron  CURRENT THERAPY: S/P rectal radiation for presumed recurrence at anastomotic site.  INTERVAL HISTORY: BERTRAN ZEIMET 63 y.o. 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  Oncology History:  He has been on drug-holiday since February of 2014 after completing 24 cycles of FOLFOX and Avastin.  His last PET/CT result in May of 2014 showed; " Persistent malignant range FDG uptake associated with the right upper lobe nodule. The degree of FDG uptake is not significantly improved from previous exam. The nodule in the right upper lobe demonstrates interval cavitation compared with previous exam. Decrease in hypermetabolism associated with multifocal liver metastasis. On today's study there is no abnormal uptake within the liver above background activity." He has since undergone radiation therapy by Dr. [name] to this right lung lesion. CT CAP on 04/18/2013 shows a new right apex of the lung lesion measuring 4.5 mm in size and is medial to aforementioned lung lesion. Most recent CT scan on 07/01/2013 demonstrates an increase in size of the lung lesion to 7.5 mm and there is now a second lesion measuring 9 mm in size in the medial aspect of left upper lobe. With these increasing lesions, the patient was referred to [place] and he was seen by his primary Radiation Oncologist, Dr. Tyler Pita. It  was agreed that [facility] qualified for SBRT treatment to these lesions and this was performed on 2/23, 2/25, 3/3, and 08/02/2013.   According to imaging studies, April 2015, there was concern regarding local recurrence at the rectal suture line, despite negative biopsy.  He was therefore treated with radiation finishing in May 2015.  I personally reviewed and went over laboratory results with the patient.  The results are noted within this dictation.  CEA is dropping and most recently was 5.7.  Iron studies are unimpressive except for a ferritin of 62 which may be acutely elevated potentially.  He is noted to be anemic with a Hgb of 7.8 g/dL which is likely secondary to radiation therapy.    From an anemia standpoint, he reports that his is minimally symptomatic and therefore, I will not provide him with a transfusion.  Instead, I will give him IV Feraheme 510 mg on Days 1 and 8.  He is agreeable to this plan.  He reports that Dr. [name] saw him yesterday.  Radiation was a success with regards to tolerability, but Ringo reports it was difficult with regards to fatigue.  According to [facility], Dr. [name] encouraged Meldon to discuss maintenance therapy with Oncology ([place]) as he is pretty sure he will have recurrence locally.  Therefore, we had a discussion regarding this option.  Maintenance therapy would be Xeloda based and given the patient pancytopenia from his liver disease, and patient reported intolerance when he took it concomitantly with radiation (suicidal and homicidal ideations), this would be an absolute contraindication.  Another option would be Avastin, but given his thrombocytopenia from liver disease, this option  is contraindicated and only provides about a 1.3 month progression-free survival over placebo.  Therefore, we do not recommend maintenance chemotherapy.  Tomoki is agreeable and supports a continued drug holiday.  If he were to need systemic chemotherapy, I would favor FOLFIRI +/- Avastin  or FOLFOX +/- Avastin at a reduced dose over maintenance therapy.  On 11/06/2013, I received a phone call from the patient with URI symptoms.  I called in a Z-Pak and today he reports that he feels much improved.   Oncologically, he denies any complaints and ROS questioning is negative.    Past Medical History  Diagnosis Date  . Pancytopenia, acquired 12/04/2010  . B12 deficiency 12/07/2010  . Kidney stones   . CAD (coronary artery disease)   . Anemia   . History of radiation therapy 06/19/07 -07/27/07    whole pelvis  . Hx of radiation therapy 08/06/12,08/09/12,& 08/13/12    rul 54GY/13f  . Cancer     Colon ca dx 2008 surg/rad/chemo  . Lung cancer   . FH: chemotherapy   . Myocardial infarction   . Rectal bleeding 09/20/2013    From recurrent rectal mass/recurrent rectal adenocarcinoma  . Acute blood loss anemia 09/27/2013  . 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  2.7 cm primary, moderately differentiated with 3/8 positive lymph nodes without LVI.  Surgery was on 04/22/2007 and was treated postoperatively with radiation and concomitant capecitabine and then 4 cycles of a planned 6 with oxaliplatin and capecitabine.  His counts would not allow treatment of  . Hydronephrosis of left kidney 09/24/2013    Kidney stones; s/p cystoscopy and stent  . Gross hematuria 09/25/2103  . Lung metastases 07/09/2013  . Secondary malignancy of right lung 07/09/2013    has DIABETES MELLITUS, TYPE II; HYPERCHOLESTEROLEMIA; ANEMIA, CHRONIC; CORONARY ARTERY DISEASE; SLEEP APNEA; Adenocarcinoma of rectum; Pancytopenia, acquired; B12 deficiency; Port catheter in place; Hx of radiation therapy; Thrombocytopenia; Secondary malignancy of right lung; Rectal bleeding; ARF (acute renal failure); Hydronephrosis; Hydronephrosis of left kidney; GI bleeding; Acute blood loss anemia; Urinary retention; and Gross hematuria on his  problem list.     is allergic to daypro and xeloda.  Mr. MFarruggiahad no medications administered during this visit.  Past Surgical History  Procedure Laterality Date  . Ileostomy  12/07/2010    Procedure: ILEOSTOMY;  Surgeon: BDonato Heinz  Location: [facility];  Service: General;  Laterality: N/A;  Diverting Ileostomy Lysis of adhesions Exploratory Laparotomy  . Lung biopsy  10/27/10    rt lobe- adenocarcinoma  . Cholecystectomy    . Cataract extraction w/ intraocular lens implant  2007    bil  . Lithotripsy  2002  . Coronary angioplasty with stent placement  2003    stenting x 2  . Colon resection  2008    x2  . Esophagogastroduodenoscopy N/A 05/02/2013    Procedure: ESOPHAGOGASTRODUODENOSCOPY (EGD);  Surgeon: RDaneil Dolin MD;  Location: [facility];  Service: Endoscopy;  Laterality: N/A;  . Givens capsule study N/A 05/02/2013    Procedure: GIVENS CAPSULE STUDY;  Surgeon: RDaneil Dolin MD;  Location: [facility];  Service: Endoscopy;  Laterality: N/A;  . Colonoscopy N/A 09/20/2013    SLF:A near circumferential fungating mass with friable surfaces was found in the rectum.  ACTIVELY OOZING.  CLOTS SEEN IN LUMEN AND ASPIRATED.  Multiple biopsies were performed using cold forceps.  Thermal therapy(BICAP 7 Fr 25W) was used to ATTEMPT TO control bleeding.    HEMOSTASIS NOT ACHEIVED.  Marland Kitchen Cystoscopy w/ ureteral stent placement Left 09/24/2013    Procedure: CYSTOSCOPY WITH LEFT RETROGRADE PYELOGRAM; LEFT URETERAL STENT PLACEMENT; REMOVAL OF SMALL BLADDER CALCULI;  Surgeon: Marissa Nestle, MD;  Location: [facility];  Service: Urology;  Laterality: Left;  . Flexible sigmoidoscopy N/A 09/25/2013    PYK:DXIPJASNK exophytic rectal mass most likely representing recurrent adenocarcinoma status post biopsy    Denies any headaches, dizziness, double vision, fevers, chills, night sweats, nausea, vomiting, diarrhea, constipation, chest pain, heart palpitations, shortness of breath, blood in stool, black  tarry stool, urinary pain, urinary burning, urinary frequency, hematuria.   PHYSICAL EXAMINATION  ECOG PERFORMANCE STATUS: 1 - Symptomatic but completely ambulatory  Filed Vitals:   11/19/13 1032  BP: 107/67  Pulse: 75  Temp: 98.1 F (36.7 C)  Resp: 18    GENERAL:alert, no distress, well nourished, well developed, comfortable, cooperative and smiling SKIN: skin color, texture, turgor are normal, no rashes or significant lesions HEAD: Normocephalic, No masses, lesions, tenderness or abnormalities EYES: normal, PERRLA, EOMI, Conjunctiva are pink and non-injected EARS: External ears normal OROPHARYNX:mucous membranes are moist  NECK: supple, trachea midline LYMPH:  not examined BREAST:not examined LUNGS: clear to auscultation  HEART: regular rate & rhythm, no murmurs and no gallops ABDOMEN:abdomen soft, normal bowel sounds and colostomy in place producing stool appropriately BACK: Back symmetric, no curvature. EXTREMITIES:less then 2 second capillary refill, no joint deformities, effusion, or inflammation, no skin discoloration, no clubbing, no cyanosis  NEURO: alert & oriented x 3 with fluent speech, no focal motor/sensory deficits, gait normal    LABORATORY DATA: CBC    Component Value Date/Time   WBC 4.4 11/13/2013 1107   RBC 2.56* 11/13/2013 1107   HGB 7.8* 11/13/2013 1107   HCT 23.0* 11/13/2013 1107   PLT 48* 11/13/2013 1107   MCV 89.8 11/13/2013 1107   MCH 30.5 11/13/2013 1107   MCHC 33.9 11/13/2013 1107   RDW 19.9* 11/13/2013 1107   LYMPHSABS 0.4* 11/13/2013 1107   MONOABS 0.4 11/13/2013 1107   EOSABS 0.0 11/13/2013 1107   BASOSABS 0.0 11/13/2013 1107      Chemistry      Component Value Date/Time   NA 139 11/13/2013 1107   K 4.0 11/13/2013 1107   CL 106 11/13/2013 1107   CO2 20 11/13/2013 1107   BUN 26* 11/13/2013 1107   CREATININE 2.09* 11/13/2013 1107      Component Value Date/Time   CALCIUM 8.6 11/13/2013 1107   ALKPHOS 119* 11/13/2013 1107   AST 41* 11/13/2013 1107     ALT 40 11/13/2013 1107   BILITOT 1.4* 11/13/2013 1107     Lab Results  Component Value Date   IRON 46 11/15/2013   TIBC 311 11/15/2013   FERRITIN 62 11/15/2013   Lab Results  Component Value Date   CEA 5.7* 11/13/2013      ASSESSMENT:  1. 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 S/P 24 treatments of FOLFOX + Avastin, on chemotherapy holiday since Feb 2014.  2. Right apex of lung nodule (7.5 mm) and left upper lobe lesion (9 mm), concerning for malignancy treated with SBRT by Dr. [name] on 2/23, 2/25, 3/3, and 08/02/2013.  3. Suspicious local recurrence, S/P radiation by Dr. [name] 4. Pancytopenia, stable  5. Splenomegaly and cirrhosis of liver 6. Pernicious anemia, on Vitamin B 12 IM replacmenet  7. Small bowel erosions per camera  capsule study by Dr. Gala Romney. 8. Recurrent renal calculi, referral to urology made. 9. URI, resolved with Z-Pak called in on 11/06/2013. 10. Iron deficiency anemia secondary to blood loss and malabsorption from chronic PPI use.  Patient Active Problem List   Diagnosis Date Noted  . Urinary retention 09/28/2013  . Gross hematuria 09/28/2013  . Acute blood loss anemia 09/27/2013  . GI bleeding 09/25/2013  . Hydronephrosis of left kidney 09/24/2013  . Hydronephrosis 09/22/2013  . Rectal bleeding 09/20/2013  . ARF (acute renal failure) 09/20/2013  . Secondary malignancy of right lung 07/09/2013  . Thrombocytopenia 05/01/2013  . Hx of radiation therapy   . Port catheter in place 09/04/2012  . B12 deficiency 12/07/2010  . Pancytopenia, acquired 12/04/2010  . DIABETES MELLITUS, TYPE II 07/07/2010  . HYPERCHOLESTEROLEMIA 07/07/2010  . ANEMIA, CHRONIC 07/07/2010  . CORONARY ARTERY DISEASE 07/07/2010  . SLEEP APNEA 07/07/2010  . Adenocarcinoma of rectum 07/07/2010     PLAN:  1. I personally reviewed and went over laboratory results with the patient.  The results are noted within this dictation. 2. Discussion regarding pros  and cons, risks, benefits, alternatives, and side effects of maintenance therapy.  Maintenance therapy is presently contraindicated.  3. IV feraheme 510 mg on days 1 and 8 (to begin tomorrow 6/24) 4. Vitamin B 12 injection in 4 weeks (last given on 11/13/2013).  Supportive therapy plan reviewed. 5. Labs in 4 weeks and 8 weeks: CBC diff, CMET, iron/TIBC, Ferritin, CEA. 6. Restaging scans in 3 months.  CT CAP wo contrast.  Will be ordered and scheduled at next follow-up visit. 7. Z-Pak called in 11/06/2013 and he completed the antibiotic. 8. Return in 8 weeks for follow-up   THERAPY PLAN:  Yann is S/P SBRT to pulmonary metastases (versus primary bronchogenic carcinoma) and recently radiation (x 14 fractions) to local recurrence at suture line (radiographically but negative pathologic review).  Sly is not a candidate for maintenance chemotherapy as he is already pancytopenic secondary to cirrhosis of liver and past chemotherapy.  Xeloda has many toxicities in addition to affecting counts which is not a good option in a man who is already pancytopenic.  Avastin in the setting of significant thrombocytopenia is a contraindication without clear evidence of systemic disease and Avastin single-agent only provides 1.3 months of progression-free survival over placebo.  Therefore, maintenance therapy is not indicated at this time.  I would favor systemic chemotherapy when indicated with FOLFOX +/- Avastin or FOLFIRI +/- Avastin, both a reduced doses.  From a hematology standpoint, he is iron deficient and therefore we will correct that with IV Feraheme.  All questions were answered. The patient knows to call the clinic with any problems, questions or concerns. We can certainly see the patient much sooner if necessary.  Patient and plan discussed with Dr. Farrel Gobble and he is in agreement with the aforementioned.    KEFALAS,THOMAS 11/19/2013

## 2013-11-19 ENCOUNTER — Encounter (HOSPITAL_BASED_OUTPATIENT_CLINIC_OR_DEPARTMENT_OTHER): Payer: Managed Care, Other (non HMO) | Admitting: Oncology

## 2013-11-19 ENCOUNTER — Encounter (HOSPITAL_COMMUNITY): Payer: Self-pay | Admitting: Oncology

## 2013-11-19 VITALS — BP 107/67 | HR 75 | Temp 98.1°F | Resp 18 | Wt 200.5 lb

## 2013-11-19 DIAGNOSIS — R161 Splenomegaly, not elsewhere classified: Secondary | ICD-10-CM

## 2013-11-19 DIAGNOSIS — D510 Vitamin B12 deficiency anemia due to intrinsic factor deficiency: Secondary | ICD-10-CM

## 2013-11-19 DIAGNOSIS — C787 Secondary malignant neoplasm of liver and intrahepatic bile duct: Secondary | ICD-10-CM

## 2013-11-19 DIAGNOSIS — K7460 Unspecified cirrhosis of liver: Secondary | ICD-10-CM

## 2013-11-19 DIAGNOSIS — D500 Iron deficiency anemia secondary to blood loss (chronic): Secondary | ICD-10-CM

## 2013-11-19 DIAGNOSIS — R911 Solitary pulmonary nodule: Secondary | ICD-10-CM

## 2013-11-19 DIAGNOSIS — D61818 Other pancytopenia: Secondary | ICD-10-CM

## 2013-11-19 DIAGNOSIS — C7800 Secondary malignant neoplasm of unspecified lung: Secondary | ICD-10-CM

## 2013-11-19 DIAGNOSIS — C20 Malignant neoplasm of rectum: Secondary | ICD-10-CM

## 2013-11-19 DIAGNOSIS — C7801 Secondary malignant neoplasm of right lung: Secondary | ICD-10-CM

## 2013-11-19 DIAGNOSIS — K909 Intestinal malabsorption, unspecified: Secondary | ICD-10-CM

## 2013-11-19 NOTE — Patient Instructions (Signed)
[place] Discharge Instructions  RECOMMENDATIONS MADE BY THE CONSULTANT AND ANY TEST RESULTS WILL BE SENT TO YOUR REFERRING PHYSICIAN.  EXAM FINDINGS BY THE PHYSICIAN TODAY AND SIGNS OR SYMPTOMS TO REPORT TO CLINIC OR PRIMARY PHYSICIAN: you saw [name] today  Iron infusion tomorrow and in a week.  Follow up labs and b12 injection in 3 weeks, and doctors followup, labs, b12 injection in 2 months  Thank you for choosing [facility] to provide your oncology and hematology care.  To afford each patient quality time with our providers, please arrive at least 15 minutes before your scheduled appointment time.  With your help, our goal is to use those 15 minutes to complete the necessary work-up to ensure our physicians have the information they need to help with your evaluation and healthcare recommendations.    Effective January 1st, 2014, we ask that you re-schedule your appointment with our physicians should you arrive 10 or more minutes late for your appointment.  We strive to give you quality time with our providers, and arriving late affects you and other patients whose appointments are after yours.    Again, thank you for choosing [facility].  Our hope is that these requests will decrease the amount of time that you wait before being seen by our physicians.       _____________________________________________________________  Should you have questions after your visit to [facility], please contact our office at [phone] between the hours of 8:30 a.m. and 5:00 p.m.  Voicemails left after 4:30 p.m. will not be returned until the following business day.  For prescription refill requests, have your pharmacy contact our office with your prescription refill request.

## 2013-11-20 ENCOUNTER — Encounter (HOSPITAL_BASED_OUTPATIENT_CLINIC_OR_DEPARTMENT_OTHER): Payer: Managed Care, Other (non HMO)

## 2013-11-20 VITALS — BP 106/64 | HR 81 | Temp 100.3°F | Resp 18

## 2013-11-20 DIAGNOSIS — D500 Iron deficiency anemia secondary to blood loss (chronic): Secondary | ICD-10-CM

## 2013-11-20 DIAGNOSIS — D508 Other iron deficiency anemias: Secondary | ICD-10-CM

## 2013-11-20 DIAGNOSIS — K909 Intestinal malabsorption, unspecified: Secondary | ICD-10-CM

## 2013-11-20 MED ORDER — SODIUM CHLORIDE 0.9 % IJ SOLN
10.0000 mL | Freq: Once | INTRAMUSCULAR | Status: AC
  Administered 2013-11-20: 10 mL via INTRAVENOUS

## 2013-11-20 MED ORDER — SODIUM CHLORIDE 0.9 % IV SOLN
510.0000 mg | Freq: Once | INTRAVENOUS | Status: AC
  Administered 2013-11-20: 510 mg via INTRAVENOUS
  Filled 2013-11-20: qty 17

## 2013-11-20 MED ORDER — HEPARIN SOD (PORK) LOCK FLUSH 100 UNIT/ML IV SOLN
INTRAVENOUS | Status: AC
  Filled 2013-11-20: qty 5

## 2013-11-20 MED ORDER — HEPARIN SOD (PORK) LOCK FLUSH 100 UNIT/ML IV SOLN
500.0000 [IU] | Freq: Once | INTRAVENOUS | Status: AC
  Administered 2013-11-20: 500 [IU] via INTRAVENOUS

## 2013-11-20 MED ORDER — SODIUM CHLORIDE 0.9 % IV SOLN
Freq: Once | INTRAVENOUS | Status: AC
  Administered 2013-11-20: 11:00:00 via INTRAVENOUS

## 2013-11-20 NOTE — Progress Notes (Signed)
Tolerated well

## 2013-11-27 ENCOUNTER — Encounter (HOSPITAL_COMMUNITY): Payer: Managed Care, Other (non HMO) | Attending: Oncology

## 2013-11-27 VITALS — BP 105/41 | HR 56 | Temp 98.7°F | Resp 18 | Wt 202.6 lb

## 2013-11-27 DIAGNOSIS — D508 Other iron deficiency anemias: Secondary | ICD-10-CM

## 2013-11-27 DIAGNOSIS — K909 Intestinal malabsorption, unspecified: Secondary | ICD-10-CM

## 2013-11-27 DIAGNOSIS — D500 Iron deficiency anemia secondary to blood loss (chronic): Secondary | ICD-10-CM | POA: Insufficient documentation

## 2013-11-27 DIAGNOSIS — C7800 Secondary malignant neoplasm of unspecified lung: Secondary | ICD-10-CM | POA: Insufficient documentation

## 2013-11-27 DIAGNOSIS — E538 Deficiency of other specified B group vitamins: Secondary | ICD-10-CM | POA: Insufficient documentation

## 2013-11-27 DIAGNOSIS — C20 Malignant neoplasm of rectum: Secondary | ICD-10-CM | POA: Insufficient documentation

## 2013-11-27 MED ORDER — SODIUM CHLORIDE 0.9 % IJ SOLN
10.0000 mL | INTRAMUSCULAR | Status: DC | PRN
  Administered 2013-11-27: 10 mL via INTRAVENOUS

## 2013-11-27 MED ORDER — HEPARIN SOD (PORK) LOCK FLUSH 100 UNIT/ML IV SOLN
500.0000 [IU] | Freq: Once | INTRAVENOUS | Status: AC
  Administered 2013-11-27: 500 [IU] via INTRAVENOUS
  Filled 2013-11-27: qty 5

## 2013-11-27 MED ORDER — SODIUM CHLORIDE 0.9 % IV SOLN
510.0000 mg | Freq: Once | INTRAVENOUS | Status: AC
  Administered 2013-11-27: 510 mg via INTRAVENOUS
  Filled 2013-11-27: qty 17

## 2013-11-27 NOTE — Progress Notes (Signed)
Patient tolerated feraheme well. Ambulated out without difficulty.

## 2013-12-10 ENCOUNTER — Other Ambulatory Visit (HOSPITAL_COMMUNITY): Payer: Managed Care, Other (non HMO)

## 2013-12-13 ENCOUNTER — Encounter (HOSPITAL_COMMUNITY): Payer: Managed Care, Other (non HMO)

## 2013-12-13 ENCOUNTER — Encounter (HOSPITAL_BASED_OUTPATIENT_CLINIC_OR_DEPARTMENT_OTHER): Payer: Managed Care, Other (non HMO)

## 2013-12-13 DIAGNOSIS — C20 Malignant neoplasm of rectum: Secondary | ICD-10-CM

## 2013-12-13 DIAGNOSIS — C7801 Secondary malignant neoplasm of right lung: Secondary | ICD-10-CM

## 2013-12-13 DIAGNOSIS — D500 Iron deficiency anemia secondary to blood loss (chronic): Secondary | ICD-10-CM

## 2013-12-13 DIAGNOSIS — E538 Deficiency of other specified B group vitamins: Secondary | ICD-10-CM

## 2013-12-13 LAB — COMPREHENSIVE METABOLIC PANEL
ALBUMIN: 2.8 g/dL — AB (ref 3.5–5.2)
ALT: 22 U/L (ref 0–53)
AST: 26 U/L (ref 0–37)
Alkaline Phosphatase: 114 U/L (ref 39–117)
Anion gap: 10 (ref 5–15)
BUN: 13 mg/dL (ref 6–23)
CALCIUM: 8.6 mg/dL (ref 8.4–10.5)
CO2: 18 mEq/L — ABNORMAL LOW (ref 19–32)
CREATININE: 1.66 mg/dL — AB (ref 0.50–1.35)
Chloride: 113 mEq/L — ABNORMAL HIGH (ref 96–112)
GFR calc Af Amer: 50 mL/min — ABNORMAL LOW (ref >90)
GFR, EST NON AFRICAN AMERICAN: 43 mL/min — AB (ref >90)
Glucose, Bld: 132 mg/dL — ABNORMAL HIGH (ref 70–99)
Potassium: 3.8 mEq/L (ref 3.7–5.3)
SODIUM: 141 meq/L (ref 137–147)
Total Bilirubin: 0.8 mg/dL (ref 0.3–1.2)
Total Protein: 6.9 g/dL (ref 6.0–8.3)

## 2013-12-13 LAB — IRON AND TIBC
IRON: 52 ug/dL (ref 42–135)
SATURATION RATIOS: 19 % — AB (ref 20–55)
TIBC: 268 ug/dL (ref 215–435)
UIBC: 216 ug/dL (ref 125–400)

## 2013-12-13 LAB — CBC WITH DIFFERENTIAL/PLATELET
BASOS ABS: 0 10*3/uL (ref 0.0–0.1)
Basophils Relative: 0 % (ref 0–1)
EOS PCT: 1 % (ref 0–5)
Eosinophils Absolute: 0 10*3/uL (ref 0.0–0.7)
HEMATOCRIT: 28.2 % — AB (ref 39.0–52.0)
Hemoglobin: 9.2 g/dL — ABNORMAL LOW (ref 13.0–17.0)
Lymphocytes Relative: 9 % — ABNORMAL LOW (ref 12–46)
Lymphs Abs: 0.3 10*3/uL — ABNORMAL LOW (ref 0.7–4.0)
MCH: 31.5 pg (ref 26.0–34.0)
MCHC: 32.6 g/dL (ref 30.0–36.0)
MCV: 96.6 fL (ref 78.0–100.0)
MONO ABS: 0.2 10*3/uL (ref 0.1–1.0)
Monocytes Relative: 5 % (ref 3–12)
NEUTROS ABS: 2.7 10*3/uL (ref 1.7–7.7)
Neutrophils Relative %: 85 % — ABNORMAL HIGH (ref 43–77)
Platelets: 47 10*3/uL — ABNORMAL LOW (ref 150–400)
RBC: 2.92 MIL/uL — ABNORMAL LOW (ref 4.22–5.81)
RDW: 17.1 % — AB (ref 11.5–15.5)
WBC: 3.2 10*3/uL — AB (ref 4.0–10.5)

## 2013-12-13 LAB — FERRITIN: Ferritin: 196 ng/mL (ref 22–322)

## 2013-12-13 MED ORDER — HEPARIN SOD (PORK) LOCK FLUSH 100 UNIT/ML IV SOLN
500.0000 [IU] | Freq: Once | INTRAVENOUS | Status: AC
  Administered 2013-12-13: 500 [IU] via INTRAVENOUS

## 2013-12-13 MED ORDER — HEPARIN SOD (PORK) LOCK FLUSH 100 UNIT/ML IV SOLN
INTRAVENOUS | Status: AC
  Filled 2013-12-13: qty 5

## 2013-12-13 MED ORDER — SODIUM CHLORIDE 0.9 % IJ SOLN
10.0000 mL | INTRAMUSCULAR | Status: DC | PRN
  Administered 2013-12-13: 10 mL via INTRAVENOUS

## 2013-12-13 MED ORDER — CYANOCOBALAMIN 1000 MCG/ML IJ SOLN
1000.0000 ug | Freq: Once | INTRAMUSCULAR | Status: AC
  Administered 2013-12-13: 1000 ug via INTRAMUSCULAR
  Filled 2013-12-13: qty 1

## 2013-12-13 NOTE — Progress Notes (Signed)
[name] presented for Portacath access and flush. Portacath located lt chest wall accessed with  H 20 needle. Good blood return present. Portacath flushed with 90ml NS and 500U/80ml Heparin and needle removed intact. Procedure without incident. Patient tolerated procedure well. [name] presents today for injection per MD orders. B12 1000 mcg administered SQ in right Upper Arm. Administration without incident. Patient tolerated well.

## 2013-12-14 LAB — CEA: CEA: 6.8 ng/mL — AB (ref 0.0–5.0)

## 2013-12-20 ENCOUNTER — Ambulatory Visit (INDEPENDENT_AMBULATORY_CARE_PROVIDER_SITE_OTHER): Payer: Managed Care, Other (non HMO) | Admitting: Internal Medicine

## 2013-12-20 ENCOUNTER — Encounter: Payer: Self-pay | Admitting: Internal Medicine

## 2013-12-20 ENCOUNTER — Encounter (INDEPENDENT_AMBULATORY_CARE_PROVIDER_SITE_OTHER): Payer: Self-pay

## 2013-12-20 VITALS — BP 103/62 | HR 56 | Temp 97.4°F | Ht 69.0 in | Wt 209.4 lb

## 2013-12-20 DIAGNOSIS — K219 Gastro-esophageal reflux disease without esophagitis: Secondary | ICD-10-CM

## 2013-12-20 DIAGNOSIS — I2510 Atherosclerotic heart disease of native coronary artery without angina pectoris: Secondary | ICD-10-CM

## 2013-12-20 DIAGNOSIS — C20 Malignant neoplasm of rectum: Secondary | ICD-10-CM

## 2013-12-20 NOTE — Progress Notes (Signed)
Primary Care Physician:  [name], MD Primary Gastroenterologist:  Dr. Gala Romney  Pre-Procedure History & Physical: HPI:  [name] is a 63 y.o. male here for followup rectal bleeding secondary to recurrent rectal cancer. Patient is status post 14 sessions of XRT to his rectum for recurrent rectal carcinoma. Patient states bleeding has subsided he is on mucus per rectum nowadays but no further bleeding. We did followup XRT with a brief course of mesalamine suppositories. He feels he is doing well. He tells me his cancer markers are daily. He still has a left ureteral stent.  GERD well controlled on omeprazole.  Past Medical History  Diagnosis Date  . Pancytopenia, acquired 12/04/2010  . B12 deficiency 12/07/2010  . Kidney stones   . CAD (coronary artery disease)   . Anemia   . History of radiation therapy 06/19/07 -07/27/07    whole pelvis  . Hx of radiation therapy 08/06/12,08/09/12,& 08/13/12    rul 54GY/55fx  . Cancer     Colon ca dx 2008 surg/rad/chemo  . Lung cancer   . FH: chemotherapy   . Myocardial infarction   . Rectal bleeding 09/20/2013    From recurrent rectal mass/recurrent rectal adenocarcinoma  . Acute blood loss anemia 09/27/2013  . 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  2.7 cm primary, moderately differentiated with 3/8 positive lymph nodes without LVI.  Surgery was on 04/22/2007 and was treated postoperatively with radiation and concomitant capecitabine and then 4 cycles of a planned 6 with oxaliplatin and capecitabine.  His counts would not allow treatment of  . Hydronephrosis of left kidney 09/24/2013    Kidney stones; s/p cystoscopy and stent  . Gross hematuria 09/25/2103  . Lung metastases 07/09/2013  . Secondary malignancy of right lung 07/09/2013    Past Surgical History  Procedure Laterality Date  . Ileostomy  12/07/2010    Procedure: ILEOSTOMY;   Surgeon: Donato Heinz;  Location: [facility];  Service: General;  Laterality: N/A;  Diverting Ileostomy Lysis of adhesions Exploratory Laparotomy  . Lung biopsy  10/27/10    rt lobe- adenocarcinoma  . Cholecystectomy    . Cataract extraction w/ intraocular lens implant  2007    bil  . Lithotripsy  2002  . Coronary angioplasty with stent placement  2003    stenting x 2  . Colon resection  2008    x2  . Esophagogastroduodenoscopy N/A 05/02/2013    Dr. Gala Romney- polypoid antral fold, bx= reactive gastropathy  . Givens capsule study N/A 05/02/2013    Procedure: GIVENS CAPSULE STUDY;  Surgeon: Daneil Dolin, MD;  Location: [facility];  Service: Endoscopy;  Laterality: N/A;  . Colonoscopy N/A 09/20/2013    SLF:A near circumferential fungating mass with friable surfaces was found in the rectum.  ACTIVELY OOZING.  CLOTS SEEN IN LUMEN AND ASPIRATED.  Multiple biopsies were performed using cold forceps.  Thermal therapy(BICAP 7 Fr 25W) was used to ATTEMPT TO control bleeding.    HEMOSTASIS NOT ACHEIVED.  Marland Kitchen Cystoscopy w/ ureteral stent placement Left 09/24/2013    Procedure: CYSTOSCOPY WITH LEFT RETROGRADE PYELOGRAM; LEFT URETERAL STENT PLACEMENT; REMOVAL OF SMALL BLADDER CALCULI;  Surgeon: Marissa Nestle, MD;  Location: [facility];  Service: Urology;  Laterality: Left;  . Flexible sigmoidoscopy N/A 09/25/2013    NOT:[id number] exophytic rectal mass most likely representing recurrent adenocarcinoma status post biopsy  Prior to Admission medications   Medication Sig Start Date End Date Taking? Authorizing Provider  cyanocobalamin (,VITAMIN B-12,) 1000 MCG/ML injection Inject 1,000 mcg into the muscle every 30 (thirty) days.    Yes Historical Provider, MD  HYDROcodone-acetaminophen (NORCO/VICODIN) 5-325 MG per tablet Take 1-2 tablets by mouth every 6 (six) hours as needed. 11/06/13  Yes Manon Hilding Kefalas, PA-C  omeprazole (PRILOSEC) 20 MG capsule Take 1 capsule (20 mg total) by mouth daily. 09/28/13  Yes Rexene Alberts, MD  mesalamine (CANASA) 1000 MG suppository Place 1 suppository (1,000 mg total) rectally at bedtime. 10/28/13   Daneil Dolin, MD    Allergies as of 12/20/2013 - Review Complete 12/20/2013  Allergen Reaction Noted  . Daypro [oxaprozin] Nausea And Vomiting 07/07/2010  . Xeloda [capecitabine] Other (See Comments) 11/19/2013    Family History  Problem Relation Age of Onset  . Cervical cancer Mother   . Rectal cancer Father 57  . Uterine cancer Sister     History   Social History  . Marital Status: Married    Spouse Name: N/A    Number of Children: 4  . Years of Education: N/A   Occupational History  . Not on file.   Social History Main Topics  . Smoking status: Former Smoker -- 2.00 packs/day for 30 years    Types: Cigarettes, Cigars  . Smokeless tobacco: Former Systems developer    Types: Chew  . Alcohol Use: Yes     Comment: occasional beer  . Drug Use: No  . Sexual Activity: Not Currently   Other Topics Concern  . Not on file   Social History Narrative  . No narrative on file    Review of Systems: See HPI, otherwise negative ROS  Physical Exam: BP 103/62  Pulse 56  Temp(Src) 97.4 F (36.3 C) (Oral)  Ht 5\' 9"  (1.753 m)  Wt 209 lb 6.4 oz (94.983 kg)  BMI 30.91 kg/m2 General:   Really looks pretty good today. Alert,  , pleasant and cooperative in NAD Skin:  Intact without significant lesions or rashes. Eyes:  Sclera clear, no icterus.   Conjunctiva pink. Ears:  Normal auditory acuity. Nose:  No deformity, discharge,  or lesions. Mouth:  No deformity or lesions. Neck:  Supple; no masses or thyromegaly. No significant cervical adenopathy. Lungs:  Clear throughout to auscultation.   No wheezes, crackles, or rhonchi. No acute distress. Heart:  Regular rate and rhythm; no murmurs, clicks, rubs,  or gallops. Abdomen: Obese. Non-distended, normal bowel sounds.  Ostomy intact. Brown stool in bag. Abdomen is soft and nontender. No mass. Soft and nontender without  appreciable mass or hepatosplenomegaly.  Pulses:  Normal pulses noted. Extremities:  Without clubbing or edema.  Impression:   Recurrent rectal cancer status post XRT and topical mesalamine therapy. He's had a very nice response. All in all, Mr. Czaja is doing pretty well. GERD symptoms well controlled on omeprazole  Recommendations:    Continue omeprazole daily  Consider a loose-fitting abdominal binder as needed  Office visit with me in 6 weeks    Notice: This dictation was prepared with Dragon dictation along with smaller phrase technology. Any transcriptional errors that result from this process are unintentional and may not be corrected upon review.

## 2013-12-20 NOTE — Patient Instructions (Signed)
Continue omeprazole daily  Consider a loose-fitting abdominal binder as needed  Office visit with me in 6 weeks

## 2013-12-31 ENCOUNTER — Other Ambulatory Visit (HOSPITAL_COMMUNITY): Payer: Managed Care, Other (non HMO)

## 2013-12-31 ENCOUNTER — Ambulatory Visit (HOSPITAL_COMMUNITY): Payer: Managed Care, Other (non HMO) | Admitting: Oncology

## 2014-01-10 NOTE — Progress Notes (Addendum)
[name] Kilts, MD [address] [place] [address]  Adenocarcinoma of rectum - Plan: CBC with Differential, Comprehensive metabolic panel, CEA, Ferritin, Iron and TIBC, CT Chest W Contrast, CT Abdomen Pelvis W Contrast, CBC with Differential, Comprehensive metabolic panel, CEA, Iron and TIBC, Ferritin, CBC with Differential, CEA, heparin lock flush 100 unit/mL, sodium chloride 0.9 % injection 10 mL, HYDROcodone-acetaminophen (NORCO/VICODIN) 5-325 MG per tablet  Secondary malignancy of right lung - Plan: CT Chest W Contrast, CT Abdomen Pelvis W Contrast  Pancytopenia, acquired - Plan: CBC with Differential  B12 deficiency - Plan: CBC with Differential, SCHEDULING COMMUNICATION INJECTION, cyanocobalamin ((VITAMIN B-12)) injection 1,000 mcg  Secondary malignant neoplasm of right lung - Plan: CBC with Differential, CEA  Iron deficiency anemia secondary to blood loss (chronic) - Plan: CBC with Differential, Iron and TIBC, Ferritin  Shoulder pain, right - Plan: HYDROcodone-acetaminophen (NORCO/VICODIN) 5-325 MG per tablet  CURRENT THERAPY: Surveillance  INTERVAL HISTORY: [name] 63 y.o. 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  Oncology History:  He has been on drug-holiday since February of 2014 after completing 24 cycles of FOLFOX and Avastin.  His last PET/CT result in May of 2014 showed; " Persistent malignant range FDG uptake associated with the right upper lobe nodule. The degree of FDG uptake is not significantly improved from previous exam. The nodule in the right upper lobe demonstrates interval cavitation compared with previous exam. Decrease in hypermetabolism associated with multifocal liver metastasis. On today's study there is no abnormal uptake within the liver above background activity." He has since undergone radiation therapy by Dr. [name] to this right lung lesion. CT CAP on  04/18/2013 shows a new right apex of the lung lesion measuring 4.5 mm in size and is medial to aforementioned lung lesion. Most recent CT scan on 07/01/2013 demonstrates an increase in size of the lung lesion to 7.5 mm and there is now a second lesion measuring 9 mm in size in the medial aspect of left upper lobe. With these increasing lesions, the patient was referred to [facility] and he was seen by his primary Radiation Oncologist, Dr. Tyler Pita. It was agreed that [facility] qualified for SBRT treatment to these lesions and this was performed on 2/23, 2/25, 3/3, and 08/02/2013. According to imaging studies, April 2015, there was concern regarding local recurrence at the rectal suture line, despite negative biopsy. He was therefore treated with radiation finishing in May 2015.  I personally reviewed and went over laboratory results with the patient.  The results are noted within this dictation.  CBC diff is back and counts are stable.  Other labs are pending at this time.  He reports a left sided pain that is intermittent on his posterior lateral back.  He denies any radiation of this pain.  He reports that it is intermittent and resolves spontaneously.  He notes intermittent hematuria, but denies any clinical complaints of UTI.    He notes a 1 day history of LE Edema, following eating cantaloupe with salt on it.  It resolved within 24 hours and today is at baseline.  Otherwise, he is doing well.  He has recovered from radiation therapy nicely.  ROS questioning is negative.   Past Medical History  Diagnosis Date  . Pancytopenia, acquired 12/04/2010  . B12 deficiency 12/07/2010  . Kidney stones   . CAD (coronary artery disease)   . Anemia   . History  of radiation therapy 06/19/07 -07/27/07    whole pelvis  . Hx of radiation therapy 08/06/12,08/09/12,& 08/13/12    rul 54GY/58f  . Cancer     Colon ca dx 2008 surg/rad/chemo  . Lung cancer   . FH: chemotherapy   . Myocardial infarction   . Rectal  bleeding 09/20/2013    From recurrent rectal mass/recurrent rectal adenocarcinoma  . Acute blood loss anemia 09/27/2013  . 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  2.7 cm primary, moderately differentiated with 3/8 positive lymph nodes without LVI.  Surgery was on 04/22/2007 and was treated postoperatively with radiation and concomitant capecitabine and then 4 cycles of a planned 6 with oxaliplatin and capecitabine.  His counts would not allow treatment of  . Hydronephrosis of left kidney 09/24/2013    Kidney stones; s/p cystoscopy and stent  . Gross hematuria 09/25/2103  . Lung metastases 07/09/2013  . Secondary malignancy of right lung 07/09/2013    has DIABETES MELLITUS, TYPE II; HYPERCHOLESTEROLEMIA; ANEMIA, CHRONIC; CORONARY ARTERY DISEASE; SLEEP APNEA; Adenocarcinoma of rectum; Pancytopenia, acquired; B12 deficiency; Port catheter in place; Hx of radiation therapy; Thrombocytopenia; Secondary malignancy of right lung; Rectal bleeding; ARF (acute renal failure); Hydronephrosis; Hydronephrosis of left kidney; GI bleeding; Acute blood loss anemia; Urinary retention; and Gross hematuria on his problem list.     is allergic to daypro and xeloda.  We administered cyanocobalamin, heparin lock flush, and sodium chloride.  Past Surgical History  Procedure Laterality Date  . Ileostomy  12/07/2010    Procedure: ILEOSTOMY;  Surgeon: BDonato Heinz  Location: [facility];  Service: General;  Laterality: N/A;  Diverting Ileostomy Lysis of adhesions Exploratory Laparotomy  . Lung biopsy  10/27/10    rt lobe- adenocarcinoma  . Cholecystectomy    . Cataract extraction w/ intraocular lens implant  2007    bil  . Lithotripsy  2002  . Coronary angioplasty with stent placement  2003    stenting x 2  . Colon resection  2008    x2  . Esophagogastroduodenoscopy N/A 05/02/2013    Dr. RGala Romney polypoid antral fold, bx=  reactive gastropathy  . Givens capsule study N/A 05/02/2013    Procedure: GIVENS CAPSULE STUDY;  Surgeon: RDaneil Dolin MD;  Location: [facility];  Service: Endoscopy;  Laterality: N/A;  . Colonoscopy N/A 09/20/2013    SLF:A near circumferential fungating mass with friable surfaces was found in the rectum.  ACTIVELY OOZING.  CLOTS SEEN IN LUMEN AND ASPIRATED.  Multiple biopsies were performed using cold forceps.  Thermal therapy(BICAP 7 Fr 25W) was used to ATTEMPT TO control bleeding.    HEMOSTASIS NOT ACHEIVED.  .Marland KitchenCystoscopy w/ ureteral stent placement Left 09/24/2013    Procedure: CYSTOSCOPY WITH LEFT RETROGRADE PYELOGRAM; LEFT URETERAL STENT PLACEMENT; REMOVAL OF SMALL BLADDER CALCULI;  Surgeon: MMarissa Nestle MD;  Location: [facility];  Service: Urology;  Laterality: Left;  . Flexible sigmoidoscopy N/A 09/25/2013    RAOZ:HYQMVHQIOexophytic rectal mass most likely representing recurrent adenocarcinoma status post biopsy    Denies any headaches, dizziness, double vision, fevers, chills, night sweats, nausea, vomiting, diarrhea, constipation, chest pain, heart palpitations, shortness of breath, blood in stool, black tarry stool, urinary pain, urinary burning, urinary frequency.   PHYSICAL EXAMINATION  ECOG PERFORMANCE STATUS: 1 - Symptomatic but completely ambulatory  Filed Vitals:   01/13/14 0948  BP: 134/65  Pulse: 55  Temp:  98.1 F (36.7 C)  Resp: 18    GENERAL:alert, no distress, well nourished, well developed, comfortable, cooperative and smiling SKIN: skin color, texture, turgor are normal, no rashes or significant lesions HEAD: Normocephalic, No masses, lesions, tenderness or abnormalities EYES: normal, PERRLA, EOMI, Conjunctiva are pink and non-injected EARS: External ears normal OROPHARYNX:lips, buccal mucosa, and tongue normal and mucous membranes are moist  NECK: supple, no adenopathy, thyroid normal size, non-tender, without nodularity, no stridor, non-tender, trachea  midline LYMPH:  no palpable lymphadenopathy BREAST:not examined LUNGS: clear to auscultation  HEART: regular rate & rhythm, no murmurs, no gallops, S1 normal and S2 normal ABDOMEN:abdomen soft, non-tender and normal bowel sounds BACK: Back symmetric, no curvature. EXTREMITIES:less then 2 second capillary refill, no joint deformities, effusion, or inflammation, no edema, no skin discoloration, no clubbing, no cyanosis  NEURO: alert & oriented x 3 with fluent speech, no focal motor/sensory deficits, gait normal   LABORATORY DATA: CBC    Component Value Date/Time   WBC 3.9* 01/13/2014 1021   RBC 3.23* 01/13/2014 1021   HGB 10.1* 01/13/2014 1021   HCT 30.3* 01/13/2014 1021   PLT 53* 01/13/2014 1021   MCV 93.8 01/13/2014 1021   MCH 31.3 01/13/2014 1021   MCHC 33.3 01/13/2014 1021   RDW 14.9 01/13/2014 1021   LYMPHSABS 0.5* 01/13/2014 1021   MONOABS 0.3 01/13/2014 1021   EOSABS 0.2 01/13/2014 1021   BASOSABS 0.0 01/13/2014 1021      Chemistry      Component Value Date/Time   NA 141 12/13/2013 1223   K 3.8 12/13/2013 1223   CL 113* 12/13/2013 1223   CO2 18* 12/13/2013 1223   BUN 13 12/13/2013 1223   CREATININE 1.66* 12/13/2013 1223      Component Value Date/Time   CALCIUM 8.6 12/13/2013 1223   ALKPHOS 114 12/13/2013 1223   AST 26 12/13/2013 1223   ALT 22 12/13/2013 1223   BILITOT 0.8 12/13/2013 1223     Lab Results  Component Value Date   IRON 52 12/13/2013   TIBC 268 12/13/2013   FERRITIN 196 12/13/2013     ASSESSMENT: 1. 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 S/P 24 treatments of FOLFOX + Avastin, on chemotherapy holiday since Feb 2014.  2. Right apex of lung nodule (7.5 mm) and left upper lobe lesion (9 mm), concerning for malignancy treated with SBRT by Dr. [name] on 2/23, 2/25, 3/3, and 08/02/2013.  3. Suspicious local recurrence at anastomotic site, S/P radiation by Dr. [name]  4. Pancytopenia, stable  5. Splenomegaly and cirrhosis of liver  6.  Pernicious anemia, on Vitamin B 12 IM replacmenet  7. Small bowel erosions per camera capsule study by Dr. Gala Romney.  8. Recurrent renal calculi, referral to urology made.  9. Iron deficiency anemia secondary to blood loss and malabsorption from chronic PPI use. S/P IV Feraheme on 6/24 and 11/27/2013.  Patient Active Problem List   Diagnosis Date Noted  . Urinary retention 09/28/2013  . Gross hematuria 09/28/2013  . Acute blood loss anemia 09/27/2013  . GI bleeding 09/25/2013  . Hydronephrosis of left kidney 09/24/2013  . Hydronephrosis 09/22/2013  . Rectal bleeding 09/20/2013  . ARF (acute renal failure) 09/20/2013  . Secondary malignancy of right lung 07/09/2013  . Thrombocytopenia 05/01/2013  . Hx of radiation therapy   . Port catheter in place 09/04/2012  . B12 deficiency 12/07/2010  . Pancytopenia, acquired 12/04/2010  . DIABETES MELLITUS, TYPE II 07/07/2010  . HYPERCHOLESTEROLEMIA 07/07/2010  .  ANEMIA, CHRONIC 07/07/2010  . CORONARY ARTERY DISEASE 07/07/2010  . SLEEP APNEA 07/07/2010  . Adenocarcinoma of rectum 07/07/2010     PLAN:  1. I personally reviewed and went over laboratory results with the patient.  The results are noted within this dictation. 2. Labs today: CBC diff, CMET, iron/TIBC, Ferritin, CEA 3. CT CAP w contrast in 6 weeks for surveillance. 4. Labs in 6 and 12 weeks: CBC diff, CMET, CEA, iron/TIBC, Ferritin 5. Continue B 12 injections 6. Follow-up with Rad Onc, Dr. [name], as scheduled on 10/1 7. Refill on Hydrocodone 8. Return in 12 weeks for follow-up.  Will see him sooner if necessary per CT report.   THERAPY PLAN:  I will restage him with CT CAP in 6 weeks, prior to his 10/1 Rad Onc follow-up appointment with Dr. [name].  I will see him back 6 weeks later to help prevent too many MD appointments in short intervals.  I will follow his CEA and iron studies and respond appropriately as needed.  All questions were answered. The patient knows to call the  clinic with any problems, questions or concerns. We can certainly see the patient much sooner if necessary.  Patient and plan discussed with Dr. Farrel Gobble and he is in agreement with the aforementioned.    [name] 01/13/2014   ADDENDUM:  Lab Results  Component Value Date   IRON 74 01/13/2014   TIBC 304 01/13/2014   FERRITIN 65 01/13/2014   I will order IV Feraheme 510 mg on days 1 and 8.  [name] 01/13/2014

## 2014-01-13 ENCOUNTER — Encounter (HOSPITAL_COMMUNITY): Payer: Managed Care, Other (non HMO) | Attending: Oncology

## 2014-01-13 ENCOUNTER — Encounter (HOSPITAL_BASED_OUTPATIENT_CLINIC_OR_DEPARTMENT_OTHER): Payer: Managed Care, Other (non HMO) | Admitting: Oncology

## 2014-01-13 ENCOUNTER — Encounter (HOSPITAL_COMMUNITY): Payer: Managed Care, Other (non HMO)

## 2014-01-13 ENCOUNTER — Encounter (HOSPITAL_COMMUNITY): Payer: Self-pay | Admitting: Oncology

## 2014-01-13 VITALS — BP 134/65 | HR 55 | Temp 98.1°F | Resp 18 | Wt 209.2 lb

## 2014-01-13 DIAGNOSIS — C20 Malignant neoplasm of rectum: Secondary | ICD-10-CM

## 2014-01-13 DIAGNOSIS — M25519 Pain in unspecified shoulder: Secondary | ICD-10-CM | POA: Diagnosis present

## 2014-01-13 DIAGNOSIS — C7801 Secondary malignant neoplasm of right lung: Secondary | ICD-10-CM

## 2014-01-13 DIAGNOSIS — D510 Vitamin B12 deficiency anemia due to intrinsic factor deficiency: Secondary | ICD-10-CM

## 2014-01-13 DIAGNOSIS — C7800 Secondary malignant neoplasm of unspecified lung: Secondary | ICD-10-CM

## 2014-01-13 DIAGNOSIS — Z9889 Other specified postprocedural states: Secondary | ICD-10-CM | POA: Diagnosis present

## 2014-01-13 DIAGNOSIS — D61818 Other pancytopenia: Secondary | ICD-10-CM

## 2014-01-13 DIAGNOSIS — D500 Iron deficiency anemia secondary to blood loss (chronic): Secondary | ICD-10-CM | POA: Insufficient documentation

## 2014-01-13 DIAGNOSIS — N200 Calculus of kidney: Secondary | ICD-10-CM

## 2014-01-13 DIAGNOSIS — K909 Intestinal malabsorption, unspecified: Secondary | ICD-10-CM

## 2014-01-13 DIAGNOSIS — M25511 Pain in right shoulder: Secondary | ICD-10-CM

## 2014-01-13 DIAGNOSIS — E538 Deficiency of other specified B group vitamins: Secondary | ICD-10-CM

## 2014-01-13 DIAGNOSIS — R161 Splenomegaly, not elsewhere classified: Secondary | ICD-10-CM

## 2014-01-13 DIAGNOSIS — K7460 Unspecified cirrhosis of liver: Secondary | ICD-10-CM

## 2014-01-13 DIAGNOSIS — C787 Secondary malignant neoplasm of liver and intrahepatic bile duct: Secondary | ICD-10-CM

## 2014-01-13 LAB — CBC WITH DIFFERENTIAL/PLATELET
Basophils Absolute: 0 10*3/uL (ref 0.0–0.1)
Basophils Relative: 0 % (ref 0–1)
EOS ABS: 0.2 10*3/uL (ref 0.0–0.7)
Eosinophils Relative: 6 % — ABNORMAL HIGH (ref 0–5)
HCT: 30.3 % — ABNORMAL LOW (ref 39.0–52.0)
HEMOGLOBIN: 10.1 g/dL — AB (ref 13.0–17.0)
LYMPHS ABS: 0.5 10*3/uL — AB (ref 0.7–4.0)
Lymphocytes Relative: 12 % (ref 12–46)
MCH: 31.3 pg (ref 26.0–34.0)
MCHC: 33.3 g/dL (ref 30.0–36.0)
MCV: 93.8 fL (ref 78.0–100.0)
MONOS PCT: 8 % (ref 3–12)
Monocytes Absolute: 0.3 10*3/uL (ref 0.1–1.0)
Neutro Abs: 2.9 10*3/uL (ref 1.7–7.7)
Neutrophils Relative %: 74 % (ref 43–77)
Platelets: 53 10*3/uL — ABNORMAL LOW (ref 150–400)
RBC: 3.23 MIL/uL — ABNORMAL LOW (ref 4.22–5.81)
RDW: 14.9 % (ref 11.5–15.5)
WBC: 3.9 10*3/uL — AB (ref 4.0–10.5)

## 2014-01-13 LAB — IRON AND TIBC
IRON: 74 ug/dL (ref 42–135)
SATURATION RATIOS: 24 % (ref 20–55)
TIBC: 304 ug/dL (ref 215–435)
UIBC: 230 ug/dL (ref 125–400)

## 2014-01-13 LAB — COMPREHENSIVE METABOLIC PANEL
ALBUMIN: 3 g/dL — AB (ref 3.5–5.2)
ALT: 12 U/L (ref 0–53)
ANION GAP: 11 (ref 5–15)
AST: 27 U/L (ref 0–37)
Alkaline Phosphatase: 101 U/L (ref 39–117)
BILIRUBIN TOTAL: 1 mg/dL (ref 0.3–1.2)
BUN: 14 mg/dL (ref 6–23)
CALCIUM: 9 mg/dL (ref 8.4–10.5)
CO2: 21 mEq/L (ref 19–32)
Chloride: 110 mEq/L (ref 96–112)
Creatinine, Ser: 1.65 mg/dL — ABNORMAL HIGH (ref 0.50–1.35)
GFR calc Af Amer: 51 mL/min — ABNORMAL LOW (ref >90)
GFR calc non Af Amer: 44 mL/min — ABNORMAL LOW (ref >90)
Glucose, Bld: 76 mg/dL (ref 70–99)
Potassium: 4.1 mEq/L (ref 3.7–5.3)
Sodium: 142 mEq/L (ref 137–147)
TOTAL PROTEIN: 7.3 g/dL (ref 6.0–8.3)

## 2014-01-13 LAB — CEA: CEA: 6.4 ng/mL — AB (ref 0.0–5.0)

## 2014-01-13 LAB — FERRITIN: FERRITIN: 65 ng/mL (ref 22–322)

## 2014-01-13 MED ORDER — HEPARIN SOD (PORK) LOCK FLUSH 100 UNIT/ML IV SOLN
INTRAVENOUS | Status: AC
  Filled 2014-01-13: qty 5

## 2014-01-13 MED ORDER — CYANOCOBALAMIN 1000 MCG/ML IJ SOLN
INTRAMUSCULAR | Status: AC
  Filled 2014-01-13: qty 1

## 2014-01-13 MED ORDER — CYANOCOBALAMIN 1000 MCG/ML IJ SOLN
1000.0000 ug | Freq: Once | INTRAMUSCULAR | Status: AC
  Administered 2014-01-13: 1000 ug via INTRAMUSCULAR

## 2014-01-13 MED ORDER — SODIUM CHLORIDE 0.9 % IJ SOLN
10.0000 mL | INTRAMUSCULAR | Status: DC | PRN
  Administered 2014-01-13: 10 mL via INTRAVENOUS

## 2014-01-13 MED ORDER — HEPARIN SOD (PORK) LOCK FLUSH 100 UNIT/ML IV SOLN
500.0000 [IU] | Freq: Once | INTRAVENOUS | Status: AC
  Administered 2014-01-13: 500 [IU] via INTRAVENOUS

## 2014-01-13 MED ORDER — HYDROCODONE-ACETAMINOPHEN 5-325 MG PO TABS: 1.0000 | ORAL_TABLET | Freq: Four times a day (QID) | ORAL | Status: DC | PRN

## 2014-01-13 NOTE — Addendum Note (Signed)
Addended by: Baird Cancer on: 01/13/2014 02:36 PM   Modules accepted: Orders, Level of Service

## 2014-01-13 NOTE — Patient Instructions (Signed)
..  [facility] Discharge Instructions  RECOMMENDATIONS MADE BY THE CONSULTANT AND ANY TEST RESULTS WILL BE SENT TO YOUR REFERRING PHYSICIAN.  EXAM FINDINGS BY THE PHYSICIAN TODAY AND SIGNS OR SYMPTOMS TO REPORT TO CLINIC OR PRIMARY PHYSICIAN: Exam and findings as discussed by Kirby Crigler.   INSTRUCTIONS/FOLLOW-UP: Labs every 6 weeks with port flush and b12 injection every 4 weeks 3 months to see Gershon Mussel  Thank you for choosing [facility] to provide your oncology and hematology care.  To afford each patient quality time with our providers, please arrive at least 15 minutes before your scheduled appointment time.  With your help, our goal is to use those 15 minutes to complete the necessary work-up to ensure our physicians have the information they need to help with your evaluation and healthcare recommendations.    Effective January 1st, 2014, we ask that you re-schedule your appointment with our physicians should you arrive 10 or more minutes late for your appointment.  We strive to give you quality time with our providers, and arriving late affects you and other patients whose appointments are after yours.    Again, thank you for choosing [facility]  Our hope is that these requests will decrease the amount of time that you wait before being seen by our physicians.       _____________________________________________________________  Should you have questions after your visit to [facility], please contact our office at [phone] between the hours of 8:30 a.m. and 4:30 p.m.  Voicemails left after 4:30 p.m. will not be returned until the following business day.  For prescription refill requests, have your pharmacy contact our office with your prescription refill request.    _______________________________________________________________  We hope that we have given you very good care.  You may receive a patient satisfaction survey in  the mail, please complete it and return it as soon as possible.  We value your feedback!  _______________________________________________________________  Have you asked about our STAR program?  STAR stands for Survivorship Training and Rehabilitation, and this is a nationally recognized cancer care program that focuses on survivorship and rehabilitation.  Cancer and cancer treatments may cause problems, such as, pain, making you feel tired and keeping you from doing the things that you need or want to do. Cancer rehabilitation can help. Our goal is to reduce these troubling effects and help you have the best quality of life possible.  You may receive a survey from a nurse that asks questions about your current state of health.  Based on the survey results, all eligible patients will be referred to the [facility] program for an evaluation so we can better serve you!  A frequently asked questions sheet is available upon request.

## 2014-01-13 NOTE — Progress Notes (Signed)
[name] presents today for injection per MD orders. B12 1000 mcg administered IM in left Upper Arm. Administration without incident. Patient tolerated well. [name] presented for Portacath access and flush. Portacath located rt chest wall accessed with  H 20 needle. Good blood return present. Portacath flushed with 64ml NS and 500U/26ml Heparin and needle removed intact. Procedure without incident. Patient tolerated procedure well.

## 2014-01-16 ENCOUNTER — Encounter (HOSPITAL_BASED_OUTPATIENT_CLINIC_OR_DEPARTMENT_OTHER): Payer: Managed Care, Other (non HMO)

## 2014-01-16 VITALS — BP 109/60 | HR 50 | Temp 98.2°F | Resp 18

## 2014-01-16 DIAGNOSIS — D500 Iron deficiency anemia secondary to blood loss (chronic): Secondary | ICD-10-CM

## 2014-01-16 MED ORDER — SODIUM CHLORIDE 0.9 % IV SOLN
Freq: Once | INTRAVENOUS | Status: AC
  Administered 2014-01-16: 11:00:00 via INTRAVENOUS

## 2014-01-16 MED ORDER — HEPARIN SOD (PORK) LOCK FLUSH 100 UNIT/ML IV SOLN
500.0000 [IU] | Freq: Once | INTRAVENOUS | Status: AC
  Administered 2014-01-16: 500 [IU] via INTRAVENOUS
  Filled 2014-01-16: qty 5

## 2014-01-16 MED ORDER — SODIUM CHLORIDE 0.9 % IV SOLN
510.0000 mg | Freq: Once | INTRAVENOUS | Status: AC
  Administered 2014-01-16: 510 mg via INTRAVENOUS
  Filled 2014-01-16: qty 17

## 2014-01-16 MED ORDER — SODIUM CHLORIDE 0.9 % IJ SOLN
10.0000 mL | Freq: Once | INTRAMUSCULAR | Status: AC
  Administered 2014-01-16: 10 mL via INTRAVENOUS

## 2014-01-16 NOTE — Progress Notes (Signed)
Tolerated well

## 2014-01-23 ENCOUNTER — Encounter (HOSPITAL_BASED_OUTPATIENT_CLINIC_OR_DEPARTMENT_OTHER): Payer: Managed Care, Other (non HMO)

## 2014-01-23 ENCOUNTER — Encounter: Payer: Self-pay | Admitting: Internal Medicine

## 2014-01-23 VITALS — BP 120/58 | HR 54 | Temp 98.2°F | Resp 18

## 2014-01-23 DIAGNOSIS — E538 Deficiency of other specified B group vitamins: Secondary | ICD-10-CM

## 2014-01-23 DIAGNOSIS — Z95828 Presence of other vascular implants and grafts: Secondary | ICD-10-CM

## 2014-01-23 DIAGNOSIS — D500 Iron deficiency anemia secondary to blood loss (chronic): Secondary | ICD-10-CM

## 2014-01-23 MED ORDER — HEPARIN SOD (PORK) LOCK FLUSH 100 UNIT/ML IV SOLN
INTRAVENOUS | Status: AC
  Filled 2014-01-23: qty 5

## 2014-01-23 MED ORDER — FERUMOXYTOL INJECTION 510 MG/17 ML
510.0000 mg | Freq: Once | INTRAVENOUS | Status: AC
  Administered 2014-01-23: 510 mg via INTRAVENOUS
  Filled 2014-01-23: qty 17

## 2014-01-23 MED ORDER — SODIUM CHLORIDE 0.9 % IJ SOLN
10.0000 mL | INTRAMUSCULAR | Status: DC | PRN
  Administered 2014-01-23: 10 mL via INTRAVENOUS

## 2014-01-23 MED ORDER — HEPARIN SOD (PORK) LOCK FLUSH 100 UNIT/ML IV SOLN
500.0000 [IU] | Freq: Once | INTRAVENOUS | Status: AC
  Administered 2014-01-23: 500 [IU] via INTRAVENOUS

## 2014-02-13 ENCOUNTER — Encounter (HOSPITAL_COMMUNITY): Payer: Managed Care, Other (non HMO)

## 2014-02-13 ENCOUNTER — Encounter (HOSPITAL_COMMUNITY): Payer: Managed Care, Other (non HMO) | Attending: Oncology

## 2014-02-13 VITALS — BP 122/64 | HR 53 | Temp 98.0°F | Resp 18 | Wt 216.0 lb

## 2014-02-13 DIAGNOSIS — C7800 Secondary malignant neoplasm of unspecified lung: Secondary | ICD-10-CM | POA: Diagnosis present

## 2014-02-13 DIAGNOSIS — Z9889 Other specified postprocedural states: Secondary | ICD-10-CM | POA: Diagnosis present

## 2014-02-13 DIAGNOSIS — D61818 Other pancytopenia: Secondary | ICD-10-CM | POA: Diagnosis present

## 2014-02-13 DIAGNOSIS — M25519 Pain in unspecified shoulder: Secondary | ICD-10-CM | POA: Insufficient documentation

## 2014-02-13 DIAGNOSIS — C7801 Secondary malignant neoplasm of right lung: Secondary | ICD-10-CM

## 2014-02-13 DIAGNOSIS — D500 Iron deficiency anemia secondary to blood loss (chronic): Secondary | ICD-10-CM | POA: Diagnosis present

## 2014-02-13 DIAGNOSIS — C20 Malignant neoplasm of rectum: Secondary | ICD-10-CM | POA: Diagnosis not present

## 2014-02-13 DIAGNOSIS — E538 Deficiency of other specified B group vitamins: Secondary | ICD-10-CM | POA: Diagnosis present

## 2014-02-13 LAB — COMPREHENSIVE METABOLIC PANEL
ALT: 14 U/L (ref 0–53)
ANION GAP: 9 (ref 5–15)
AST: 27 U/L (ref 0–37)
Albumin: 3 g/dL — ABNORMAL LOW (ref 3.5–5.2)
Alkaline Phosphatase: 116 U/L (ref 39–117)
BUN: 15 mg/dL (ref 6–23)
CALCIUM: 9 mg/dL (ref 8.4–10.5)
CO2: 22 mEq/L (ref 19–32)
CREATININE: 1.73 mg/dL — AB (ref 0.50–1.35)
Chloride: 112 mEq/L (ref 96–112)
GFR calc Af Amer: 47 mL/min — ABNORMAL LOW (ref >90)
GFR calc non Af Amer: 41 mL/min — ABNORMAL LOW (ref >90)
Glucose, Bld: 80 mg/dL (ref 70–99)
Potassium: 3.8 mEq/L (ref 3.7–5.3)
Sodium: 143 mEq/L (ref 137–147)
TOTAL PROTEIN: 7.1 g/dL (ref 6.0–8.3)
Total Bilirubin: 1.3 mg/dL — ABNORMAL HIGH (ref 0.3–1.2)

## 2014-02-13 LAB — IRON AND TIBC
IRON: 94 ug/dL (ref 42–135)
Saturation Ratios: 38 % (ref 20–55)
TIBC: 250 ug/dL (ref 215–435)
UIBC: 156 ug/dL (ref 125–400)

## 2014-02-13 LAB — CBC WITH DIFFERENTIAL/PLATELET
Basophils Absolute: 0 10*3/uL (ref 0.0–0.1)
Basophils Relative: 0 % (ref 0–1)
EOS ABS: 0.2 10*3/uL (ref 0.0–0.7)
Eosinophils Relative: 5 % (ref 0–5)
HCT: 30.3 % — ABNORMAL LOW (ref 39.0–52.0)
Hemoglobin: 9.9 g/dL — ABNORMAL LOW (ref 13.0–17.0)
LYMPHS ABS: 0.5 10*3/uL — AB (ref 0.7–4.0)
Lymphocytes Relative: 12 % (ref 12–46)
MCH: 31.7 pg (ref 26.0–34.0)
MCHC: 32.7 g/dL (ref 30.0–36.0)
MCV: 97.1 fL (ref 78.0–100.0)
MONOS PCT: 8 % (ref 3–12)
Monocytes Absolute: 0.3 10*3/uL (ref 0.1–1.0)
Neutro Abs: 2.9 10*3/uL (ref 1.7–7.7)
Neutrophils Relative %: 75 % (ref 43–77)
Platelets: 44 10*3/uL — ABNORMAL LOW (ref 150–400)
RBC: 3.12 MIL/uL — AB (ref 4.22–5.81)
RDW: 16.1 % — ABNORMAL HIGH (ref 11.5–15.5)
WBC: 3.8 10*3/uL — ABNORMAL LOW (ref 4.0–10.5)

## 2014-02-13 LAB — FERRITIN: Ferritin: 298 ng/mL (ref 22–322)

## 2014-02-13 LAB — CEA: CEA: 6.6 ng/mL — ABNORMAL HIGH (ref 0.0–5.0)

## 2014-02-13 MED ORDER — CYANOCOBALAMIN 1000 MCG/ML IJ SOLN
INTRAMUSCULAR | Status: AC
  Filled 2014-02-13: qty 1

## 2014-02-13 MED ORDER — CYANOCOBALAMIN 1000 MCG/ML IJ SOLN
1000.0000 ug | Freq: Once | INTRAMUSCULAR | Status: AC
  Administered 2014-02-13: 1000 ug via INTRAMUSCULAR

## 2014-02-13 MED ORDER — SODIUM CHLORIDE 0.9 % IJ SOLN
10.0000 mL | INTRAMUSCULAR | Status: DC | PRN
  Administered 2014-02-13: 10 mL via INTRAVENOUS

## 2014-02-13 MED ORDER — HEPARIN SOD (PORK) LOCK FLUSH 100 UNIT/ML IV SOLN
500.0000 [IU] | Freq: Once | INTRAVENOUS | Status: AC
  Administered 2014-02-13: 500 [IU] via INTRAVENOUS
  Filled 2014-02-13: qty 5

## 2014-02-13 NOTE — Progress Notes (Signed)
[name] presented for Portacath access and flush.  Proper placement of portacath confirmed by CXR.  Portacath located left  chest wall accessed with  H 20 needle.  Good blood return present. Portacath flushed with 32ml NS and 500U/65ml Heparin and needle removed intact.  Procedure tolerated well and without incident.     Also received vitamin b12 today

## 2014-02-13 NOTE — Progress Notes (Signed)
Please see injection encounter

## 2014-02-24 ENCOUNTER — Other Ambulatory Visit (HOSPITAL_COMMUNITY): Payer: Managed Care, Other (non HMO)

## 2014-02-24 ENCOUNTER — Ambulatory Visit (HOSPITAL_COMMUNITY)
Admission: RE | Admit: 2014-02-24 | Discharge: 2014-02-24 | Disposition: A | Discharge status: Home / Self Care | Payer: Managed Care, Other (non HMO) | Source: Ambulatory Visit | Attending: Oncology | Admitting: Oncology

## 2014-02-24 DIAGNOSIS — C7800 Secondary malignant neoplasm of unspecified lung: Secondary | ICD-10-CM | POA: Diagnosis not present

## 2014-02-24 DIAGNOSIS — I700 Atherosclerosis of aorta: Secondary | ICD-10-CM | POA: Insufficient documentation

## 2014-02-24 DIAGNOSIS — C19 Malignant neoplasm of rectosigmoid junction: Secondary | ICD-10-CM | POA: Diagnosis not present

## 2014-02-24 DIAGNOSIS — R161 Splenomegaly, not elsewhere classified: Secondary | ICD-10-CM | POA: Insufficient documentation

## 2014-02-24 DIAGNOSIS — C7801 Secondary malignant neoplasm of right lung: Secondary | ICD-10-CM

## 2014-02-24 DIAGNOSIS — I2584 Coronary atherosclerosis due to calcified coronary lesion: Secondary | ICD-10-CM | POA: Diagnosis not present

## 2014-02-24 DIAGNOSIS — C20 Malignant neoplasm of rectum: Secondary | ICD-10-CM

## 2014-02-24 MED ORDER — IOHEXOL 300 MG/ML  SOLN
100.0000 mL | Freq: Once | INTRAMUSCULAR | Status: AC | PRN
  Administered 2014-02-24: 80 mL via INTRAVENOUS

## 2014-02-25 ENCOUNTER — Ambulatory Visit (INDEPENDENT_AMBULATORY_CARE_PROVIDER_SITE_OTHER): Payer: Managed Care, Other (non HMO) | Admitting: Internal Medicine

## 2014-02-25 ENCOUNTER — Telehealth: Payer: Self-pay

## 2014-02-25 ENCOUNTER — Encounter: Payer: Self-pay | Admitting: Internal Medicine

## 2014-02-25 VITALS — BP 123/70 | HR 70 | Temp 97.6°F | Ht 70.0 in | Wt 211.0 lb

## 2014-02-25 DIAGNOSIS — K219 Gastro-esophageal reflux disease without esophagitis: Secondary | ICD-10-CM

## 2014-02-25 DIAGNOSIS — I2510 Atherosclerotic heart disease of native coronary artery without angina pectoris: Secondary | ICD-10-CM

## 2014-02-25 DIAGNOSIS — C20 Malignant neoplasm of rectum: Secondary | ICD-10-CM

## 2014-02-25 DIAGNOSIS — K921 Melena: Secondary | ICD-10-CM

## 2014-02-25 NOTE — Progress Notes (Signed)
Primary Care Physician:  [name], MD Primary Gastroenterologist:  Dr. Gala Romney  Pre-Procedure History & Physical: HPI:  [name] is a 63 y.o. male here for followup of rectal bleeding felt to be secondary to recurrent rectal cancer. Status post topical mesalamine therapy in the way of [place] suppositories and radiation therapy under the direction of Dr. [name]. Over the past 3 months, patient states rectal bleeding has subsided. He has very little discharge out of his rectum at this time. Ostomy functioning well. No abdominal pain. GERD symptoms historically well controlled on omeprazole but he ran out recently. He's gained 2 pounds since his last office visit. He reminded me that he still has a left ureteral stent in place followed by Dr. [name]. Patient does complain of vague lower thoracic and upper lumbar pain from time to time.  He denies any numbness or weakness in his legs as well as any urinary tract symptpms. Please note the EMR is down globally today which limits my ability to navigate the encounter. Repeat chest, abdominal and pelvic CT done yesterday  Past Medical History  Diagnosis Date  . Pancytopenia, acquired 12/04/2010  . B12 deficiency 12/07/2010  . Kidney stones   . CAD (coronary artery disease)   . Anemia   . History of radiation therapy 06/19/07 -07/27/07    whole pelvis  . Hx of radiation therapy 08/06/12,08/09/12,& 08/13/12    rul 54GY/70fx  . Cancer     Colon ca dx 2008 surg/rad/chemo  . Lung cancer   . FH: chemotherapy   . Myocardial infarction   . Rectal bleeding 09/20/2013    From recurrent rectal mass/recurrent rectal adenocarcinoma  . Acute blood loss anemia 09/27/2013  . 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  2.7 cm primary, moderately differentiated with 3/8 positive lymph nodes without LVI.  Surgery was on 04/22/2007 and was treated  postoperatively with radiation and concomitant capecitabine and then 4 cycles of a planned 6 with oxaliplatin and capecitabine.  His counts would not allow treatment of  . Hydronephrosis of left kidney 09/24/2013    Kidney stones; s/p cystoscopy and stent  . Gross hematuria 09/25/2103  . Lung metastases 07/09/2013  . Secondary malignancy of right lung 07/09/2013    Past Surgical History  Procedure Laterality Date  . Ileostomy  12/07/2010    Procedure: ILEOSTOMY;  Surgeon: Donato Heinz;  Location: [facility];  Service: General;  Laterality: N/A;  Diverting Ileostomy Lysis of adhesions Exploratory Laparotomy  . Lung biopsy  10/27/10    rt lobe- adenocarcinoma  . Cholecystectomy    . Cataract extraction w/ intraocular lens implant  2007    bil  . Lithotripsy  2002  . Coronary angioplasty with stent placement  2003    stenting x 2  . Colon resection  2008    x2  . Esophagogastroduodenoscopy N/A 05/02/2013    Dr. Gala Romney- polypoid antral fold, bx= reactive gastropathy  . Givens capsule study N/A 05/02/2013    Procedure: GIVENS CAPSULE STUDY;  Surgeon: Daneil Dolin, MD;  Location: [facility];  Service: Endoscopy;  Laterality: N/A;  . Colonoscopy N/A 09/20/2013    SLF:A near circumferential fungating mass with friable surfaces was found in the rectum.  ACTIVELY OOZING.  CLOTS SEEN IN LUMEN AND ASPIRATED.  Multiple biopsies were performed using cold forceps.  Thermal therapy(BICAP 7 Fr 25W) was  used to ATTEMPT TO control bleeding.    HEMOSTASIS NOT ACHEIVED.  Marland Kitchen Cystoscopy w/ ureteral stent placement Left 09/24/2013    Procedure: CYSTOSCOPY WITH LEFT RETROGRADE PYELOGRAM; LEFT URETERAL STENT PLACEMENT; REMOVAL OF SMALL BLADDER CALCULI;  Surgeon: Marissa Nestle, MD;  Location: [facility];  Service: Urology;  Laterality: Left;  . Flexible sigmoidoscopy N/A 09/25/2013    ENI:DPOEUMPNT exophytic rectal mass most likely representing recurrent adenocarcinoma status post biopsy    Prior to Admission  medications   Medication Sig Start Date End Date Taking? Authorizing Provider  cyanocobalamin (,VITAMIN B-12,) 1000 MCG/ML injection Inject 1,000 mcg into the muscle every 30 (thirty) days.    Yes Historical Provider, MD  HYDROcodone-acetaminophen (NORCO/VICODIN) 5-325 MG per tablet Take 1-2 tablets by mouth every 6 (six) hours as needed. 01/13/14  Yes Manon Hilding Kefalas, PA-C  omeprazole (PRILOSEC) 20 MG capsule Take 1 capsule (20 mg total) by mouth daily. 09/28/13   Rexene Alberts, MD    Allergies as of 02/25/2014 - Review Complete 02/25/2014  Allergen Reaction Noted  . Daypro [oxaprozin] Nausea And Vomiting 07/07/2010  . Xeloda [capecitabine] Other (See Comments) 11/19/2013    Family History  Problem Relation Age of Onset  . Cervical cancer Mother   . Rectal cancer Father 48  . Uterine cancer Sister     History   Social History  . Marital Status: Married    Spouse Name: N/A    Number of Children: 4  . Years of Education: N/A   Occupational History  . Not on file.   Social History Main Topics  . Smoking status: Former Smoker -- 2.00 packs/day for 30 years    Types: Cigarettes, Cigars  . Smokeless tobacco: Former Systems developer    Types: Chew  . Alcohol Use: Yes     Comment: occasional beer  . Drug Use: No  . Sexual Activity: Not Currently   Other Topics Concern  . Not on file   Social History Narrative  . No narrative on file    Review of Systems: See HPI, otherwise negative ROS  Physical Exam: BP 123/70  Pulse 70  Temp(Src) 97.6 F (36.4 C) (Oral)  Ht 5\' 10"  (1.778 m)  Wt 211 lb (95.709 kg)  BMI 30.28 kg/m2 General:   Alert, conversant pleasant and cooperative in NAD Skin:  Intact without significant lesions or rashes. Eyes:  Sclera clear, no icterus.   Conjunctiva pink. Ears:  Normal auditory acuity. Nose:  No deformity, discharge,  or lesions. Mouth:  No deformity or lesions. Neck:  Supple; no masses or thyromegaly. No significant cervical adenopathy. Lungs:   Clear throughout to auscultation.   No wheezes, crackles, or rhonchi. No acute distress. Heart:  Regular rate and rhythm; no murmurs, clicks, rubs,  or gallops. Abdomen: Well-healed surgical scars. Ostomy right lower quadrant appears healthy. Abdomen is soft and nontender. No obvious mass.  Pulses:  Normal pulses noted. Extremities:  Without clubbing or edema.  Impression: Pleasant 63 year old gentleman with recurrent metastatic colorectal cancer with rectal recurrence and secondary hematochezia status post XRT to the rectum and topical mesalamine therapy. He has done remarkably well over the past 3 months; he is no longer taking mesalamine. He has had some nonspecific lower thoracic and upper lumbar back pain. GERD symptoms  well controlled on omeprazole but he recently ran out  Recommendations:  Resume omeprazole 20 mg orally daily.  A prescription has been called in to the pharmacy.  Patient encouraged to discuss his back pain with Dr.  Manning later in the week.  Office here in 3 months  Patient is to call with any interim problems.   Addendum: I was able to access the radiology record after patient left today. Unfortunately, chest CT appears to depict recurrent pulmonary metastasis. 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 No obstruction. The rectal wall thickening seen previously has diminished. Patient is to following up with the ordering physician(Dr. Barnet Glasgow in the near future.   Notice: This dictation was prepared with Dragon dictation along with smaller phrase technology. Any transcriptional errors that result from this process are unintentional and may not be corrected upon review.

## 2014-02-25 NOTE — Telephone Encounter (Signed)
Per Dr. Gala Romney, I called in RX Prilosec 20 mg one qd #90 with 3 refills to [facility] and left message on their pharmacy vm.

## 2014-02-25 NOTE — Patient Instructions (Signed)
Resume omeprazole 20 mg orally daily.  A prescription has been called in to the pharmacy.  Patient encouraged to discuss his back pain  With Dr. [name] when he is seen in his office later in the week.  Office here in 3 months  Patient is to call with any interim problems.

## 2014-02-27 ENCOUNTER — Ambulatory Visit
Admission: RE | Admit: 2014-02-27 | Discharge: 2014-02-27 | Disposition: A | Discharge status: Home / Self Care | Payer: Managed Care, Other (non HMO) | Source: Ambulatory Visit | Attending: Radiation Oncology | Admitting: Radiation Oncology

## 2014-02-27 ENCOUNTER — Encounter: Payer: Self-pay | Admitting: Radiation Oncology

## 2014-02-27 VITALS — BP 140/73 | HR 66 | Temp 98.9°F | Resp 16 | Wt 211.9 lb

## 2014-02-27 DIAGNOSIS — C7801 Secondary malignant neoplasm of right lung: Secondary | ICD-10-CM

## 2014-02-27 NOTE — Progress Notes (Signed)
Denies dysuria. Reports mild hematuria. Reports rectal bleeding has resolved. Reports drainage of clear foul fluid from rectum continues. Reports shortness of breath with exertion. Reports productive cough with white sputum.  Patient has stent in left flank that is no longer draining. Reports back pain that start in his low back and radiates to his shoulders. Denies nausea, vomiting, headache or dizziness.

## 2014-02-27 NOTE — Progress Notes (Signed)
Radiation Oncology         [phone] ________________________________  Name: [name] MRN: [id number]  Date: 02/27/2014  DOB: 05-25-1951  Follow-Up Visit Note  CC: [name], MD  [facility], PA-C  Diagnosis:   63 year old gentleman with metastatic rectal cancer   1. Post-LAR pelvic radiotherapy from 1/20-2/27/2009 to a total prescription dose of 54 gray at [facility] in [place]   2. 3/10-3/17/2014 SBRT to a 2.3 cm solitary right upper lung nodule received 54 Gy in 3 fractions   3. 2/23-08/02/2013 SBRT to 2 isolated lung metastases    A. 54 Gy in 3 fractions of 18 Gy to his right upper lobe metastasis    B. 54 Gy in 3 fractions of 18 Gy to his left upper lobe metastasis   4.  Palliative radiation to rectal recurrence in May 2015 to 35 Gy.  Interval Since Last Radiation:  4  months  Narrative:  The patient returns today for routine follow-up.  His bleeding is improved.  Otherwise no compaint.                              ALLERGIES:  is allergic to daypro and xeloda.  Meds: Current Outpatient Prescriptions  Medication Sig Dispense Refill  . cyanocobalamin (,VITAMIN B-12,) 1000 MCG/ML injection Inject 1,000 mcg into the muscle every 30 (thirty) days.       Marland Kitchen HYDROcodone-acetaminophen (NORCO/VICODIN) 5-325 MG per tablet Take 1-2 tablets by mouth every 6 (six) hours as needed.  60 tablet  0  . omeprazole (PRILOSEC) 20 MG capsule Take 1 capsule (20 mg total) by mouth daily.  30 capsule  2   No current facility-administered medications for this encounter.   Facility-Administered Medications Ordered in Other Encounters  Medication Dose Route Frequency Provider Last Rate Last Dose  . 0.9 %  sodium chloride infusion   Intravenous Continuous Pieter Partridge, MD 20 mL/hr at 07/17/12 1113    . heparin lock flush 100 unit/mL  500 Units Intravenous Once Pieter Partridge, MD      . sodium chloride 0.9 % injection 10 mL  10 mL Intravenous PRN Pieter Partridge, MD    10 mL at 07/17/12 1113    Physical Findings: The patient is in no acute distress. Patient is alert and oriented.  weight is 211 lb 14.4 oz (96.117 kg). His oral temperature is 98.9 F (37.2 C). His blood pressure is 140/73 and his pulse is 66. His respiration is 16 and oxygen saturation is 100%. .  No significant changes.  Lab Findings: Lab Results  Component Value Date   WBC 3.8* 02/13/2014   HGB 9.9* 02/13/2014   HCT 30.3* 02/13/2014   MCV 97.1 02/13/2014   PLT 44* 02/13/2014    @LASTCHEM @  Radiographic Findings: Ct Chest W Contrast  02/24/2014   CLINICAL DATA:  metastatic colorectal carcinoma.  EXAM: CT CHEST, ABDOMEN, AND PELVIS WITH CONTRAST  TECHNIQUE: Multidetector CT imaging of the chest, abdomen and pelvis was performed following the standard protocol during bolus administration of intravenous contrast.  CONTRAST:  40mL OMNIPAQUE IOHEXOL 300 MG/ML  SOLN  COMPARISON:  09/23/2013  FINDINGS: CT CHEST FINDINGS  Mediastinum: The heart size appears normal. There is no pericardial effusion identified. The trachea appears patent and is midline. Normal appearance of the esophagus. Calcified atherosclerotic disease involves the thoracic aorta as well as the LAD, left circumflex and RCA Coronary arteries. New  AP window lymph node measures 8 mm, image 19/series 2. No pathologically enlarged mediastinal or hilar lymph nodes identified.  Lungs/Pleura: There is no pleural effusion identified. Postsurgical change in changes of external beam radiation identified within the right upper lobe. Within this area there is a new mass like opacity measuring 2.3 x 2.0 cm, image 13/series 7. Multiple pulmonary nodules are identified in both lungs. These are increased in number from previous exam. Index nodule within the right upper lung measures 6 mm, image 10/series 7 in the left upper lobe there is a 5 mm nodule, image 10/series 7. Also new from previous exam within the left lower lobe there is a 4 mm nodule, image  21/ series 7. New from previous exam.  Musculoskeletal: No aggressive lytic or sclerotic bone lesions identified.  CT ABDOMEN AND PELVIS FINDINGS  Hepatobiliary: Slightly irregular contour or of the liver appears similar to previous exam. Focal area of intrahepatic bile duct dilatation within the lateral segment of left hepatic lobe is similar to previous exam. There is no suspicious liver lesions identified. The patient is status post coli cystectomy. No biliary dilatation.  Spleen: The spleen measures 14.4 cm in cranial caudal dimension. This is unchanged from previous exam.  Pancreas: The pancreas appears normal.  Stomach/Bowel: Small hiatal hernia noted. The stomach is otherwise unremarkable. The small bowel loops have a normal caliber without evidence for bowel obstruction. Patient has a right lower quadrant ileostomy. There is a peristomal hernia containing nonobstructed loops of small bowel. Soft tissue mass adjacent to the rectal suture line measures 2.1 x 2.6 cm, image 93/ series 2. Previously 3.9 x 3.0 cm.  Adrenals/urinary tract: The adrenal glands are both normal. Bilateral renal cysts are again identified. There is also bilateral renal calcifications. The largest stone remains in the dilated left renal pelvis measuring 1.8 cm. Persistent left hydronephrosis has progressed from the previous exam. The left-sided double-J nephro ureteral stent remains in place. Stone within the proximal left ureter is identified measuring 5 mm. The urinary bladder is unremarkable for the degree of distention.  Vascular/Lymphatic: Calcified atherosclerotic disease involves the abdominal aorta. There is no aneurysm. No retroperitoneal adenopathy identified. Gastrohepatic ligament lymph node measures 7 mm, image 48/ series 2. Unchanged from previous exam. No mesenteric adenopathy. No enlarged pelvic lymph nodes.  Reproductive: Prostate gland and seminal vesicles are grossly unremarkable.  Musculoskeletal: Review of the  visualized bony structures is significant for thoracic and lumbar spondylosis. No aggressive lytic or sclerotic bone lesions identified peripherally sclerotic structure within the left iliac bone is unchanged from previous exam and is favored to represent a benign abnormality. Spondylosis  Other: No free fluid or fluid collections within the abdomen or pelvis. No peritoneal implants identified.  IMPRESSION: 1. Findings within the chest are worrisome for recurrent tumor with bilateral pulmonary metastasis. 2. Soft tissue density adjacent to the rectal suture line has decreased in size from previous exam. 3. Progressive left-sided obstructive uropathy with double-J nephro ureteral stent in place. 4. Atherosclerotic disease including multi vessel coronary artery calcifications. 5. 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 6. Stable splenomegaly.   Electronically Signed   By: Kerby Moors M.D.   On: 02/24/2014 10:23   Ct Abdomen Pelvis W Contrast  02/24/2014   CLINICAL DATA:  metastatic colorectal carcinoma.  EXAM: CT CHEST, ABDOMEN, AND PELVIS WITH CONTRAST  TECHNIQUE: Multidetector CT imaging of the chest, abdomen and pelvis was performed following the standard protocol during bolus  administration of intravenous contrast.  CONTRAST:  88mL OMNIPAQUE IOHEXOL 300 MG/ML  SOLN  COMPARISON:  09/23/2013  FINDINGS: CT CHEST FINDINGS  Mediastinum: The heart size appears normal. There is no pericardial effusion identified. The trachea appears patent and is midline. Normal appearance of the esophagus. Calcified atherosclerotic disease involves the thoracic aorta as well as the LAD, left circumflex and RCA Coronary arteries. New AP window lymph node measures 8 mm, image 19/series 2. No pathologically enlarged mediastinal or hilar lymph nodes identified.  Lungs/Pleura: There is no pleural effusion identified. Postsurgical change in changes of external beam radiation  identified within the right upper lobe. Within this area there is a new mass like opacity measuring 2.3 x 2.0 cm, image 13/series 7. Multiple pulmonary nodules are identified in both lungs. These are increased in number from previous exam. Index nodule within the right upper lung measures 6 mm, image 10/series 7 in the left upper lobe there is a 5 mm nodule, image 10/series 7. Also new from previous exam within the left lower lobe there is a 4 mm nodule, image 21/ series 7. New from previous exam.  Musculoskeletal: No aggressive lytic or sclerotic bone lesions identified.  CT ABDOMEN AND PELVIS FINDINGS  Hepatobiliary: Slightly irregular contour or of the liver appears similar to previous exam. Focal area of intrahepatic bile duct dilatation within the lateral segment of left hepatic lobe is similar to previous exam. There is no suspicious liver lesions identified. The patient is status post coli cystectomy. No biliary dilatation.  Spleen: The spleen measures 14.4 cm in cranial caudal dimension. This is unchanged from previous exam.  Pancreas: The pancreas appears normal.  Stomach/Bowel: Small hiatal hernia noted. The stomach is otherwise unremarkable. The small bowel loops have a normal caliber without evidence for bowel obstruction. Patient has a right lower quadrant ileostomy. There is a peristomal hernia containing nonobstructed loops of small bowel. Soft tissue mass adjacent to the rectal suture line measures 2.1 x 2.6 cm, image 93/ series 2. Previously 3.9 x 3.0 cm.  Adrenals/urinary tract: The adrenal glands are both normal. Bilateral renal cysts are again identified. There is also bilateral renal calcifications. The largest stone remains in the dilated left renal pelvis measuring 1.8 cm. Persistent left hydronephrosis has progressed from the previous exam. The left-sided double-J nephro ureteral stent remains in place. Stone within the proximal left ureter is identified measuring 5 mm. The urinary bladder is  unremarkable for the degree of distention.  Vascular/Lymphatic: Calcified atherosclerotic disease involves the abdominal aorta. There is no aneurysm. No retroperitoneal adenopathy identified. Gastrohepatic ligament lymph node measures 7 mm, image 48/ series 2. Unchanged from previous exam. No mesenteric adenopathy. No enlarged pelvic lymph nodes.  Reproductive: Prostate gland and seminal vesicles are grossly unremarkable.  Musculoskeletal: Review of the visualized bony structures is significant for thoracic and lumbar spondylosis. No aggressive lytic or sclerotic bone lesions identified peripherally sclerotic structure within the left iliac bone is unchanged from previous exam and is favored to represent a benign abnormality. Spondylosis  Other: No free fluid or fluid collections within the abdomen or pelvis. No peritoneal implants identified.  IMPRESSION: 1. Findings within the chest are worrisome for recurrent tumor with bilateral pulmonary metastasis. 2. Soft tissue density adjacent to the rectal suture line has decreased in size from previous exam. 3. Progressive left-sided obstructive uropathy with double-J nephro ureteral stent in place. 4. Atherosclerotic disease including multi vessel coronary artery calcifications. 5. Right lower quadrant ileostomy with parastomal hernia containing nonobstructed loops  of small bowel. 6. Stable splenomegaly.   Electronically Signed   By: Kerby Moors M.D.   On: 02/24/2014 10:23    Impression:  The patient is recovering from the effects of radiation.  He has more than 5 new lung nodules, which is likely too many to consider SBRT, unless there are no systemic therapy options.  Plan:  Follow-up with medical oncology about systemic therapy.  _____________________________________  Sheral Apley. [name], M.D.

## 2014-03-13 ENCOUNTER — Encounter (HOSPITAL_COMMUNITY): Payer: Managed Care, Other (non HMO) | Attending: Oncology

## 2014-03-13 ENCOUNTER — Encounter (HOSPITAL_COMMUNITY): Payer: Self-pay | Admitting: Oncology

## 2014-03-13 ENCOUNTER — Encounter (HOSPITAL_COMMUNITY): Payer: Self-pay | Admitting: Lab

## 2014-03-13 ENCOUNTER — Encounter (HOSPITAL_BASED_OUTPATIENT_CLINIC_OR_DEPARTMENT_OTHER): Payer: Managed Care, Other (non HMO) | Admitting: Oncology

## 2014-03-13 VITALS — BP 116/54 | HR 65 | Temp 98.5°F | Resp 16 | Wt 210.2 lb

## 2014-03-13 DIAGNOSIS — R05 Cough: Secondary | ICD-10-CM

## 2014-03-13 DIAGNOSIS — E538 Deficiency of other specified B group vitamins: Secondary | ICD-10-CM | POA: Insufficient documentation

## 2014-03-13 DIAGNOSIS — C7801 Secondary malignant neoplasm of right lung: Secondary | ICD-10-CM | POA: Insufficient documentation

## 2014-03-13 DIAGNOSIS — C20 Malignant neoplasm of rectum: Secondary | ICD-10-CM | POA: Insufficient documentation

## 2014-03-13 DIAGNOSIS — D619 Aplastic anemia, unspecified: Secondary | ICD-10-CM | POA: Insufficient documentation

## 2014-03-13 DIAGNOSIS — M25511 Pain in right shoulder: Secondary | ICD-10-CM

## 2014-03-13 DIAGNOSIS — J069 Acute upper respiratory infection, unspecified: Secondary | ICD-10-CM

## 2014-03-13 MED ORDER — LIDOCAINE-PRILOCAINE 2.5-2.5 % EX CREA: TOPICAL_CREAM | CUTANEOUS | Status: DC

## 2014-03-13 MED ORDER — ONDANSETRON HCL 8 MG PO TABS: 8.0000 mg | ORAL_TABLET | Freq: Two times a day (BID) | ORAL | Status: DC | PRN

## 2014-03-13 MED ORDER — CYANOCOBALAMIN 1000 MCG/ML IJ SOLN
1000.0000 ug | Freq: Once | INTRAMUSCULAR | Status: AC
  Administered 2014-03-13: 1000 ug via INTRAMUSCULAR
  Filled 2014-03-13: qty 1

## 2014-03-13 MED ORDER — HYDROCODONE-ACETAMINOPHEN 5-325 MG PO TABS: 1.0000 | ORAL_TABLET | Freq: Four times a day (QID) | ORAL | Status: DC | PRN

## 2014-03-13 MED ORDER — AZITHROMYCIN 250 MG PO TABS: ORAL_TABLET | ORAL | Status: DC

## 2014-03-13 NOTE — Progress Notes (Signed)
[name] presents today for injection per MD orders. B12 1000 mcg administered SQ in left Upper Arm. Administration without incident. Patient tolerated well.  STAR Program Physical Impairment and Functional Assessment Screening Tool  1. Are you having any pain, including headaches, joint pain, or muscle pain (upper body = OT; lower body = PT)?  Yes, this is new since my last visit.  2. Do your hands and/or feet feel numb or tingle (PT)?  Yes, this started after my diagnosis and is still a problem.  3. Does any part of your body feel swollen or larger than usual (upper body = OT; lower body = PT)?  No  4. Are you so tired that you cannot do the things you want or need to do (PT or OT)?  Yes, this started after my diagnosis and is still a problem.  5. Are you feeling weak or are you having trouble moving any part of your body (PT/OT)?  Yes, this started after my diagnosis and is still a problem.  6. Are you having trouble concentrating, thinking, or remembering things (OT/ST)?  Yes, this started after my diagnosis and is still a problem.  7. Are you having trouble moving around or feel like you might trip or fall (PT)?  No  8. Are you having trouble swallowing (ST)?  No  9. Are you having trouble speaking (ST)?  No  10. Are you having trouble with going or getting to the bathroom (OT)?  No  11. Are you having trouble with your sexual function (OT)?  No  12. Are you having trouble lifting things, even just your arms (OT/PT)?  Yes, this is new since my last visit.  68. Are you having trouble taking care of yourself as in dressing or bathing (OT)?  No  14. Are you having trouble with daily tasks like chores or shopping (OT)?  No  15. Are you having trouble driving (OT)?  No  16. Are you having trouble returning to work or completing your tasks at work (OT)?  disabled  Other concerns:    Legend: OT = Occupational Therapy PT = Physical  Therapy ST = Speech Therapy

## 2014-03-13 NOTE — Patient Instructions (Signed)
[facility] Discharge Instructions  RECOMMENDATIONS MADE BY THE CONSULTANT AND ANY TEST RESULTS WILL BE SENT TO YOUR REFERRING PHYSICIAN.  EXAM FINDINGS BY THE PHYSICIAN TODAY AND SIGNS OR SYMPTOMS TO REPORT TO CLINIC OR PRIMARY PHYSICIAN: Exam and findings as discussed by Robynn Pane, PA-C.   B12 injection today.  Will make a referral to [facility].   Lupita Raider will call you regarding chemotherapy teaching and once that is done we will get you scheduled for weekly chemo with Erbitux.  MEDICATIONS PRESCRIBED:  Vicodin refilled. Z pak - take as directed.  INSTRUCTIONS/FOLLOW-UP: To be arranged.  Thank you for choosing [facility] to provide your oncology and hematology care.  To afford each patient quality time with our providers, please arrive at least 15 minutes before your scheduled appointment time.  With your help, our goal is to use those 15 minutes to complete the necessary work-up to ensure our physicians have the information they need to help with your evaluation and healthcare recommendations.    Effective January 1st, 2014, we ask that you re-schedule your appointment with our physicians should you arrive 10 or more minutes late for your appointment.  We strive to give you quality time with our providers, and arriving late affects you and other patients whose appointments are after yours.    Again, thank you for choosing [facility].  Our hope is that these requests will decrease the amount of time that you wait before being seen by our physicians.       _____________________________________________________________  Should you have questions after your visit to [facility], please contact our office at [phone] between the hours of 8:30 a.m. and 4:30 p.m.  Voicemails left after 4:30 p.m. will not be returned until the following business day.  For prescription refill requests, have your pharmacy contact our  office with your prescription refill request.    _______________________________________________________________  We hope that we have given you very good care.  You may receive a patient satisfaction survey in the mail, please complete it and return it as soon as possible.  We value your feedback!  _______________________________________________________________  Have you asked about our STAR program?  STAR stands for Survivorship Training and Rehabilitation, and this is a nationally recognized cancer care program that focuses on survivorship and rehabilitation.  Cancer and cancer treatments may cause problems, such as, pain, making you feel tired and keeping you from doing the things that you need or want to do. Cancer rehabilitation can help. Our goal is to reduce these troubling effects and help you have the best quality of life possible.  You may receive a survey from a nurse that asks questions about your current state of health.  Based on the survey results, all eligible patients will be referred to the [facility] program for an evaluation so we can better serve you!  A frequently asked questions sheet is available upon request. Cetuximab injection What is this medicine? CETUXIMAB (se TUX i mab) is a chemotherapy drug. It targets a specific protein within cancer cells and stops the cells from growing. It is used to treat colorectal cancer and head and neck cancer. This medicine may be used for other purposes; ask your health care provider or pharmacist if you have questions. COMMON BRAND NAME(S): Erbitux What should I tell my health care provider before I take this medicine? They need to know if you have any of these conditions: -heart disease -history  of irregular heartbeat -history of low levels of calcium, magnesium, or potassium in the blood -lung or breathing disease, like asthma -an unusual or allergic reaction to cetuximab, other medicines, foods, dyes, or preservatives -pregnant or  trying to get pregnant -breast-feeding How should I use this medicine? This drug is given as an infusion into a vein. It is administered in a hospital or clinic by a specially trained health care professional. Talk to your pediatrician regarding the use of this medicine in children. Special care may be needed. Overdosage: If you think you have taken too much of this medicine contact a poison control center or emergency room at once. NOTE: This medicine is only for you. Do not share this medicine with others. What if I miss a dose? It is important not to miss your dose. Call your doctor or health care professional if you are unable to keep an appointment. What may interact with this medicine? Interactions are not expected. This list may not describe all possible interactions. Give your health care provider a list of all the medicines, herbs, non-prescription drugs, or dietary supplements you use. Also tell them if you smoke, drink alcohol, or use illegal drugs. Some items may interact with your medicine. What should I watch for while using this medicine? Visit your doctor or health care professional for regular checks on your progress. This drug may make you feel generally unwell. This is not uncommon, as chemotherapy can affect healthy cells as well as cancer cells. Report any side effects. Continue your course of treatment even though you feel ill unless your doctor tells you to stop. This medicine can make you more sensitive to the sun. Keep out of the sun while taking this medicine and for 2 months after the last dose. If you cannot avoid being in the sun, wear protective clothing and use sunscreen. Do not use sun lamps or tanning beds/booths. You may need blood work done while you are taking this medicine. In some cases, you may be given additional medicines to help with side effects. Follow all directions for their use. Call your doctor or health care professional for advice if you get a fever,  chills or sore throat, or other symptoms of a cold or flu. Do not treat yourself. This drug decreases your body's ability to fight infections. Try to avoid being around people who are sick. Avoid taking products that contain aspirin, acetaminophen, ibuprofen, naproxen, or ketoprofen unless instructed by your doctor. These medicines may hide a fever. Do not become pregnant while taking this medicine. Women should inform their doctor if they wish to become pregnant or think they might be pregnant. There is a potential for serious side effects to an unborn child. Use adequate birth control methods. Avoid pregnancy for at least 6 months after your last dose. Talk to your health care professional or pharmacist for more information. Do not breast-feed an infant while taking this medicine or during the 2 months after your last dose. What side effects may I notice from receiving this medicine? Side effects that you should report to your doctor or health care professional as soon as possible: -allergic reactions like skin rash, itching or hives, swelling of the face, lips, or tongue -breathing problems -changes in vision -fast, irregular heartbeat -feeling faint or lightheaded, falls -fever, chills -mouth sores -redness, blistering, peeling or loosening of the skin, including inside the mouth -trouble passing urine or change in the amount of urine -unusually weak or tired Side effects that usually  do not require medical attention (report to your doctor or health care professional if they continue or are bothersome): -changes in skin like acne, cracks, skin dryness -constipation -diarrhea -headache -nail changes -nausea, vomiting -stomach upset -weight loss This list may not describe all possible side effects. Call your doctor for medical advice about side effects. You may report side effects to FDA at 1-800-FDA-1088. Where should I keep my medicine? This drug is given in a hospital or clinic and  will not be stored at home. NOTE: This sheet is a summary. It may not cover all possible information. If you have questions about this medicine, talk to your doctor, pharmacist, or health care provider.  2015, [employer]/Gold Standard. (2013-08-28 16:14:34)

## 2014-03-13 NOTE — Patient Instructions (Addendum)
[place]   CHEMOTHERAPY INSTRUCTIONS  Premeds:  Benadryl 50mg  intravenously. This will be given to you prior to every Erbitux infusion. (Benadryl will make you feel sleepy). Benadryl is being given to lessen the likelihood/severity of an infusion reaction to the Erbitux.   Erbitux - this is a monoclonal antibody. This medication is derived partially from a mouse protein.   Side Effects: infusion related reactions may include bronchospasm, fever, chills, shaking chills, itching, swelling around heart/blood vessels, low blood pressure, lung toxicity, acneform rash. It can also cause dry skin, fatigue, muscle aches, diarrhea, vomiting, no appetite, low white blood cell count, low magnesium, weight loss.  First dose of this medication is over 2 hours and if tolerated well then subsequent doses will infuse over 1 hour.    EDUCATIONAL MATERIALS GIVEN AND REVIEWED: Chemotherapy and You Booklet Specific Instructions Sheets: Erbitux   SELF CARE ACTIVITIES WHILE ON CHEMOTHERAPY: Increase your fluid intake 48 hours prior to treatment and drink at least 2 quarts per day after treatment., No alcohol intake., No aspirin or other medications unless approved by your oncologist., Eat foods that are light and easy to digest., Eat foods at cold or room temperature., No fried, fatty, or spicy foods immediately before or after treatment., Have teeth cleaned professionally before starting treatment. Keep dentures and partial plates clean., Use soft toothbrush and do not use mouthwashes that contain alcohol. Biotene is a good mouthwash that is available at most pharmacies or may be ordered by calling [phone]., Use warm salt water gargles (1 teaspoon salt per 1 quart warm water) before and after meals and at bedtime. Or you may rinse with 2 tablespoons of three -percent hydrogen peroxide mixed in eight ounces of water., Always use sunscreen with SPF (Sun Protection Factor) of  30 or higher., Use your nausea medication as directed to prevent nausea., Use your stool softener or laxative as directed to prevent constipation. and Use your anti-diarrheal medication as directed to stop diarrhea.  Please wash your hands for at least 30 seconds using warm soapy water. Handwashing is the #1 way to prevent the spread of germs. Stay away from sick people or people who are getting over a cold. If you develop respiratory systems such as green/yellow mucus production or productive cough or persistent cough let us know and we will see if you need an antibiotic. It is a good idea to keep a pair of gloves on when going into grocery stores/[employer] to decrease your risk of coming into contact with germs on the carts, etc. Carry alcohol hand gel with you at all times and use it frequently if out in public. All foods need to be cooked thoroughly. No raw foods. No medium or undercooked meats, eggs. If your food is cooked medium well, it does not need to be hot pink or saturated with bloody liquid at all. Vegetables and fruits need to be washed/rinsed under the faucet with a dish detergent before being consumed. You can eat raw fruits and vegetables unless we tell you otherwise but it would be best if you cooked them or bought frozen. Do not eat off of salad bars or hot bars unless you really trust the cleanliness of the restaurant. If you need dental work, please let Dr. Tressie Stalker know before you go for your appointment so that we can coordinate the best possible time for you in regards to your chemo regimen. You need to also let your dentist know that you are  actively taking chemo. We may need to do labs prior to your dental appointment. We also want your bowels moving at least every other day. If this is not happening, we need to know so that we can get you on a bowel regimen to help you go.     MEDICATIONS: You have been given prescriptions for the following medications:  Zofran 8mg  tablet. May take  1 tablet two times a day IF needed for nausea/vomiting.  EMLA cream. Apply a quarter size amount to port site 1 hour prior to chemo. Do not rub in. Cover with plastic wrap.  Over-the-Counter Meds:  Colace - this is a stool softener. Take 100mg  capsule 2-6 times a day as needed. If you have to take more than 6 capsules of Colace a day call the [facility].  Senna - this is a mild laxative used to treat mild constipation. May take 2 tabs by mouth daily or up to twice a day as needed for mild constipation.  Milk of Magnesia - this is a laxative used to treat moderate to severe constipation. May take 2-4 tablespoons every 8 hours as needed. May increase to 8 tablespoons x 1 dose and if no bowel movement call the [facility].  Imodium - this is for diarrhea. Take 2 tabs after 1st loose stool and then 1 tab after each loose stool until you go a total of 12 hours without a loose stool. Call [facility] if loose stools continue.  SYMPTOMS TO REPORT AS SOON AS POSSIBLE AFTER TREATMENT:  FEVER GREATER THAN 100.5 F  CHILLS WITH OR WITHOUT FEVER  NAUSEA AND VOMITING THAT IS NOT CONTROLLED WITH YOUR NAUSEA MEDICATION  UNUSUAL SHORTNESS OF BREATH  UNUSUAL BRUISING OR BLEEDING  TENDERNESS IN MOUTH AND THROAT WITH OR WITHOUT PRESENCE OF ULCERS  URINARY PROBLEMS  BOWEL PROBLEMS  UNUSUAL RASH    Wear comfortable clothing and clothing appropriate for easy access to any Portacath or PICC line. Let us know if there is anything that we can do to make your therapy better!      I have been informed and understand all of the instructions given to me and have received a copy. I have been instructed to call the clinic [phone] or my family physician as soon as possible for continued medical care, if indicated. I do not have any more questions at this time but understand that I may call the [facility] or the Patient Navigator at [phone] during office hours should I have questions  or need assistance in obtaining follow-up care.          Cetuximab injection What is this medicine? CETUXIMAB (se TUX i mab) is a chemotherapy drug. It targets a specific protein within cancer cells and stops the cells from growing. It is used to treat colorectal cancer and head and neck cancer. This medicine may be used for other purposes; ask your health care provider or pharmacist if you have questions. COMMON BRAND NAME(S): Erbitux What should I tell my health care provider before I take this medicine? They need to know if you have any of these conditions: -heart disease -history of irregular heartbeat -history of low levels of calcium, magnesium, or potassium in the blood -lung or breathing disease, like asthma -an unusual or allergic reaction to cetuximab, other medicines, foods, dyes, or preservatives -pregnant or trying to get pregnant -breast-feeding How should I use this medicine? This drug is given as an infusion into a vein. It is  administered in a hospital or clinic by a specially trained health care professional. Talk to your pediatrician regarding the use of this medicine in children. Special care may be needed. Overdosage: If you think you have taken too much of this medicine contact a poison control center or emergency room at once. NOTE: This medicine is only for you. Do not share this medicine with others. What if I miss a dose? It is important not to miss your dose. Call your doctor or health care professional if you are unable to keep an appointment. What may interact with this medicine? Interactions are not expected. This list may not describe all possible interactions. Give your health care provider a list of all the medicines, herbs, non-prescription drugs, or dietary supplements you use. Also tell them if you smoke, drink alcohol, or use illegal drugs. Some items may interact with your medicine. What should I watch for while using this medicine? Visit your  doctor or health care professional for regular checks on your progress. This drug may make you feel generally unwell. This is not uncommon, as chemotherapy can affect healthy cells as well as cancer cells. Report any side effects. Continue your course of treatment even though you feel ill unless your doctor tells you to stop. This medicine can make you more sensitive to the sun. Keep out of the sun while taking this medicine and for 2 months after the last dose. If you cannot avoid being in the sun, wear protective clothing and use sunscreen. Do not use sun lamps or tanning beds/booths. You may need blood work done while you are taking this medicine. In some cases, you may be given additional medicines to help with side effects. Follow all directions for their use. Call your doctor or health care professional for advice if you get a fever, chills or sore throat, or other symptoms of a cold or flu. Do not treat yourself. This drug decreases your body's ability to fight infections. Try to avoid being around people who are sick. Avoid taking products that contain aspirin, acetaminophen, ibuprofen, naproxen, or ketoprofen unless instructed by your doctor. These medicines may hide a fever. Do not become pregnant while taking this medicine. Women should inform their doctor if they wish to become pregnant or think they might be pregnant. There is a potential for serious side effects to an unborn child. Use adequate birth control methods. Avoid pregnancy for at least 6 months after your last dose. Talk to your health care professional or pharmacist for more information. Do not breast-feed an infant while taking this medicine or during the 2 months after your last dose. What side effects may I notice from receiving this medicine? Side effects that you should report to your doctor or health care professional as soon as possible: -allergic reactions like skin rash, itching or hives, swelling of the face, lips, or  tongue -breathing problems -changes in vision -fast, irregular heartbeat -feeling faint or lightheaded, falls -fever, chills -mouth sores -redness, blistering, peeling or loosening of the skin, including inside the mouth -trouble passing urine or change in the amount of urine -unusually weak or tired Side effects that usually do not require medical attention (report to your doctor or health care professional if they continue or are bothersome): -changes in skin like acne, cracks, skin dryness -constipation -diarrhea -headache -nail changes -nausea, vomiting -stomach upset -weight loss This list may not describe all possible side effects. Call your doctor for medical advice about side effects. You may report  side effects to FDA at 1-800-FDA-1088. Where should I keep my medicine? This drug is given in a hospital or clinic and will not be stored at home. NOTE: This sheet is a summary. It may not cover all possible information. If you have questions about this medicine, talk to your doctor, pharmacist, or health care provider.  2015, [employer]. (2013-08-28 16:14:34)

## 2014-03-13 NOTE — Progress Notes (Signed)
[name] is seen today as a work-in due to results of CT scan report.  He saw Dr. [name] recently following recent restaging CT scans.  He is noted to have multiple bilateral pulmonary nodules.  Due to the laterality of disease and number of nodules, he is not a candidate for SBRT to these nodules.  Therefore, we will pursue systemic chemotherapy.  I personally reviewed and went over radiographic studies with the patient.  The results are noted within this dictation.    I have ascertained KRAS status and he is KRAS wild-type.  He therefore qualifies for Erbitux therapy.  He is not a [place] candidate due to never receiving Irinotecan in the past.  I reviewed Erbitux therapy in detail and reviewed the risks, benefits, alternatives, and side effects of therapy.  He is agreeable to pursue systemic chemotherapy.  In the future, Irinotecan can be added if needed.  He will need chemotherapy teaching by our nurse navigator and that will be set-up in the near future.  Following 2-3 cycles of therapy, we will restage him with CTs.  Today he is due for B12 and that will per administered today.  He requests a refill on pain medication.  He is a urologic patient of Dr. [name], but the patient is concerned about rumors his Dr. [name] retiring.  As a result, he would like to establish his urologic care elsewhere.  I will refer him to [facility].  He does have stents in place requiring exchange in the future.  He also notes a cough productive of yellow sputum that has been ongoing for about 2 weeks.  He denies any fevers.  He admits to hoarse voice.  I will treat with Z-pak in preparation for starting chemotherapy.   Otherwise, he notes B/L upper arm discomfort that is achy in nature.  It was worse with the wet weather we had recently.  No abnormalities noted on exam.  Etiology is unknown.  He will undergo chemo teaching and start weekly Erbitux following.  Antibody plan built accordingly.  Patient and  plan discussed with Dr. Farrel Gobble and he is in agreement with the aforementioned.   More than 50% of the time spent with the patient was utilized for counseling and coordination of care.   Rori Goar 03/13/2014

## 2014-03-13 NOTE — Progress Notes (Signed)
Referral sent to [facility] on 10/ 14 for [place] office

## 2014-03-14 ENCOUNTER — Telehealth (HOSPITAL_COMMUNITY): Payer: Self-pay | Admitting: Oncology

## 2014-03-14 ENCOUNTER — Encounter (HOSPITAL_BASED_OUTPATIENT_CLINIC_OR_DEPARTMENT_OTHER): Payer: Managed Care, Other (non HMO)

## 2014-03-14 ENCOUNTER — Ambulatory Visit (HOSPITAL_COMMUNITY): Payer: Managed Care, Other (non HMO) | Admitting: Oncology

## 2014-03-14 ENCOUNTER — Other Ambulatory Visit (HOSPITAL_COMMUNITY): Payer: Self-pay | Admitting: Oncology

## 2014-03-14 DIAGNOSIS — C20 Malignant neoplasm of rectum: Secondary | ICD-10-CM

## 2014-03-14 DIAGNOSIS — C7801 Secondary malignant neoplasm of right lung: Secondary | ICD-10-CM

## 2014-03-14 DIAGNOSIS — C787 Secondary malignant neoplasm of liver and intrahepatic bile duct: Secondary | ICD-10-CM

## 2014-03-14 DIAGNOSIS — C7800 Secondary malignant neoplasm of unspecified lung: Secondary | ICD-10-CM

## 2014-03-14 NOTE — Progress Notes (Signed)
Monoclonal antibody teaching done regarding Erbitux. New schedule given to patient. Zofran & EMLA cream called in yesterday to pharmacy. Pt to pick up today.

## 2014-03-14 NOTE — Telephone Encounter (Signed)
PC TO [employer] [phone] TO SEE IF [id number] REQUIRES AUTH. PER [place] IT DOES TRNSFD TO [id number].

## 2014-03-15 NOTE — Progress Notes (Signed)
[name], [place] [address]  Adenocarcinoma of rectum  CURRENT THERAPY: Starting Erbitux single-agent today (10/19)  INTERVAL HISTORY: [name] 63 y.o. 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   Due to progression of disease, we will start single-agent Erbitux.  He was evaluated for Rad Onc, Dr. [name], and radiation is not a treatment option at this time due to bilateral pulmonary lesions and the number of lesions.  Therefore, systemic therapy is his only treatment option at this time.  He is S/P chemotherapy teaching by our Nurse Navigator, Lupita Raider.  All questions are answered and he denies any questions.  "Let's get this going."    Past Medical History  Diagnosis Date  . Pancytopenia, acquired 12/04/2010  . B12 deficiency 12/07/2010  . Kidney stones   . CAD (coronary artery disease)   . Anemia   . History of radiation therapy 06/19/07 -07/27/07    whole pelvis  . Hx of radiation therapy 08/06/12,08/09/12,& 08/13/12    rul 56GY/62f  . Cancer     Colon ca dx 2008 surg/rad/chemo  . Lung cancer   . FH: chemotherapy   . Myocardial infarction   . Rectal bleeding 09/20/2013    From recurrent rectal mass/recurrent rectal adenocarcinoma  . Acute blood loss anemia 09/27/2013  . 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  2.7 cm primary, moderately differentiated with 3/8 positive lymph nodes without LVI.  Surgery was on 04/22/2007 and was treated postoperatively with radiation and concomitant capecitabine and then 4 cycles of a planned 6 with oxaliplatin and capecitabine.  His counts would not allow treatment of  . Hydronephrosis of left kidney 09/24/2013    Kidney stones; s/p cystoscopy and stent  . Gross hematuria 09/25/2103  . Lung metastases 07/09/2013  .  Secondary malignancy of right lung 07/09/2013    has DIABETES MELLITUS, TYPE II; HYPERCHOLESTEROLEMIA; ANEMIA, CHRONIC; CORONARY ARTERY DISEASE; SLEEP APNEA; Adenocarcinoma of rectum; Pancytopenia, acquired; B12 deficiency; Port catheter in place; Hx of radiation therapy; Thrombocytopenia; Secondary malignancy of right lung; Rectal bleeding; ARF (acute renal failure); Hydronephrosis; Hydronephrosis of left kidney; GI bleeding; Acute blood loss anemia; Urinary retention; and Gross hematuria on his problem list.     is allergic to daypro and xeloda.  Mr. MMahldoes not currently have medications on file.  Past Surgical History  Procedure Laterality Date  . Ileostomy  12/07/2010    Procedure: ILEOSTOMY;  Surgeon: BDonato Heinz  Location: [facility];  Service: General;  Laterality: N/A;  Diverting Ileostomy Lysis of adhesions Exploratory Laparotomy  . Lung biopsy  10/27/10    rt lobe- adenocarcinoma  . Cholecystectomy    . Cataract extraction w/ intraocular lens implant  2007    bil  . Lithotripsy  2002  . Coronary angioplasty with stent placement  2003    stenting x 2  . Colon resection  2008    x2  . Esophagogastroduodenoscopy N/A 05/02/2013    Dr. RGala Romney polypoid antral fold, bx= reactive gastropathy  . Givens capsule study N/A 05/02/2013    Procedure: GIVENS CAPSULE STUDY;  Surgeon: RDaneil Dolin MD;  Location: [facility];  Service: Endoscopy;  Laterality: N/A;  . Colonoscopy N/A 09/20/2013    SLF:A near circumferential fungating mass with friable surfaces was found in  the rectum.  ACTIVELY OOZING.  CLOTS SEEN IN LUMEN AND ASPIRATED.  Multiple biopsies were performed using cold forceps.  Thermal therapy(BICAP 7 Fr 25W) was used to ATTEMPT TO control bleeding.    HEMOSTASIS NOT ACHEIVED.  Marland Kitchen Cystoscopy w/ ureteral stent placement Left 09/24/2013    Procedure: CYSTOSCOPY WITH LEFT RETROGRADE PYELOGRAM; LEFT URETERAL STENT PLACEMENT; REMOVAL OF SMALL BLADDER CALCULI;  Surgeon: Marissa Nestle, MD;  Location: [facility];  Service: Urology;  Laterality: Left;  . Flexible sigmoidoscopy N/A 09/25/2013    BOF:BPZWCHENI exophytic rectal mass most likely representing recurrent adenocarcinoma status post biopsy    Denies any headaches, dizziness, double vision, fevers, chills, night sweats, nausea, vomiting, diarrhea, constipation, chest pain, heart palpitations, shortness of breath, blood in stool, black tarry stool, urinary pain, urinary burning, urinary frequency, hematuria.   PHYSICAL EXAMINATION  ECOG PERFORMANCE STATUS: 0 - Asymptomatic  Filed Vitals:   03/17/14 0900  BP: 134/68  Pulse: 61  Temp: 98.1 F (36.7 C)  Resp: 18    GENERAL:alert, no distress, well nourished, well developed, comfortable, cooperative and smiling SKIN: skin color, texture, turgor are normal, no rashes or significant lesions HEAD: Normocephalic, No masses, lesions, tenderness or abnormalities EYES: normal, PERRLA, EOMI, Conjunctiva are pink and non-injected EARS: External ears normal OROPHARYNX:lips, buccal mucosa, and tongue normal and mucous membranes are moist  NECK: supple, trachea midline LYMPH:  not examined BREAST:not examined BACK: Back symmetric, no curvature. EXTREMITIES:less then 2 second capillary refill, no joint deformities, effusion, or inflammation, no skin discoloration, no clubbing, no cyanosis  NEURO: alert & oriented x 3 with fluent speech, no focal motor/sensory deficits   LABORATORY DATA: CBC    Component Value Date/Time   WBC 3.8* 02/13/2014 1032   RBC 3.12* 02/13/2014 1032   HGB 9.9* 02/13/2014 1032   HCT 30.3* 02/13/2014 1032   PLT 44* 02/13/2014 1032   MCV 97.1 02/13/2014 1032   MCH 31.7 02/13/2014 1032   MCHC 32.7 02/13/2014 1032   RDW 16.1* 02/13/2014 1032   LYMPHSABS 0.5* 02/13/2014 1032   MONOABS 0.3 02/13/2014 1032   EOSABS 0.2 02/13/2014 1032   BASOSABS 0.0 02/13/2014 1032      Chemistry      Component Value Date/Time   NA 142 03/17/2014 0832   K 4.0  03/17/2014 0832   CL 113* 03/17/2014 0832   CO2 19 03/17/2014 0832   BUN 18 03/17/2014 0832   CREATININE 2.03* 03/17/2014 0832      Component Value Date/Time   CALCIUM 9.5 03/17/2014 0832   ALKPHOS 124* 03/17/2014 0832   AST 34 03/17/2014 0832   ALT 25 03/17/2014 0832   BILITOT 1.1 03/17/2014 0832     Lab Results  Component Value Date   CEA 6.6* 02/13/2014      ASSESSMENT:  1. Progressive metastatic rectal cancer (KRAS wild type) with progression of pulmonary metastases on 02/24/14 CT scans. 2. Right apex of lung nodule (7.5 mm) and left upper lobe lesion (9 mm), concerning for malignancy treated with SBRT by Dr. [name] on 2/23, 2/25, 3/3, and 08/02/2013.  3. Suspicious local recurrence at anastomotic site, S/P radiation by Dr. [name]  4. Pancytopenia, stable  5. Splenomegaly and cirrhosis of liver  6. Pernicious anemia, on Vitamin B 12 IM replacmenet  7. Small bowel erosions per camera capsule study by Dr. Gala Romney.  8. Recurrent renal calculi, referral to urology made.  9. Iron deficiency anemia secondary to blood loss and malabsorption from chronic PPI use. S/P IV  Feraheme on 6/24 and 11/27/2013.  Patient Active Problem List   Diagnosis Date Noted  . Urinary retention 09/28/2013  . Gross hematuria 09/28/2013  . Acute blood loss anemia 09/27/2013  . GI bleeding 09/25/2013  . Hydronephrosis of left kidney 09/24/2013  . Hydronephrosis 09/22/2013  . Rectal bleeding 09/20/2013  . ARF (acute renal failure) 09/20/2013  . Secondary malignancy of right lung 07/09/2013  . Thrombocytopenia 05/01/2013  . Hx of radiation therapy   . Port catheter in place 09/04/2012  . B12 deficiency 12/07/2010  . Pancytopenia, acquired 12/04/2010  . DIABETES MELLITUS, TYPE II 07/07/2010  . HYPERCHOLESTEROLEMIA 07/07/2010  . ANEMIA, CHRONIC 07/07/2010  . CORONARY ARTERY DISEASE 07/07/2010  . SLEEP APNEA 07/07/2010  . Adenocarcinoma of rectum 07/07/2010     PLAN:  1. I personally reviewed and  went over laboratory results with the patient.  The results are noted within this dictation. 2. I personally reviewed and went over radiographic studies with the patient.  The results are noted within this dictation.   3. Pre-chemo labs as ordered 4. Erbitux single-agent today. 5. Return in 2 weeks for follow-up   THERAPY PLAN:  We will treat him with single-agent Erbitux.  At some time, Irinotecan can be added if needed.  All questions were answered. The patient knows to call the clinic with any problems, questions or concerns. We can certainly see the patient much sooner if necessary.  Patient and plan discussed with Dr. Farrel Gobble and he is in agreement with the aforementioned.   [name] 03/17/2014

## 2014-03-17 ENCOUNTER — Encounter (HOSPITAL_BASED_OUTPATIENT_CLINIC_OR_DEPARTMENT_OTHER): Payer: Managed Care, Other (non HMO)

## 2014-03-17 ENCOUNTER — Encounter (HOSPITAL_COMMUNITY): Payer: Self-pay | Admitting: Oncology

## 2014-03-17 ENCOUNTER — Encounter (HOSPITAL_BASED_OUTPATIENT_CLINIC_OR_DEPARTMENT_OTHER): Payer: Managed Care, Other (non HMO) | Admitting: Oncology

## 2014-03-17 VITALS — BP 134/68 | HR 61 | Temp 98.1°F | Resp 18 | Wt 206.5 lb

## 2014-03-17 VITALS — BP 131/67 | HR 65 | Temp 98.1°F | Resp 18

## 2014-03-17 DIAGNOSIS — C20 Malignant neoplasm of rectum: Secondary | ICD-10-CM

## 2014-03-17 DIAGNOSIS — C7801 Secondary malignant neoplasm of right lung: Secondary | ICD-10-CM | POA: Diagnosis present

## 2014-03-17 DIAGNOSIS — D619 Aplastic anemia, unspecified: Secondary | ICD-10-CM | POA: Diagnosis present

## 2014-03-17 DIAGNOSIS — Z5112 Encounter for antineoplastic immunotherapy: Secondary | ICD-10-CM

## 2014-03-17 DIAGNOSIS — C7802 Secondary malignant neoplasm of left lung: Secondary | ICD-10-CM

## 2014-03-17 DIAGNOSIS — E538 Deficiency of other specified B group vitamins: Secondary | ICD-10-CM | POA: Diagnosis present

## 2014-03-17 LAB — COMPREHENSIVE METABOLIC PANEL
ALBUMIN: 3.3 g/dL — AB (ref 3.5–5.2)
ALK PHOS: 124 U/L — AB (ref 39–117)
ALT: 25 U/L (ref 0–53)
ANION GAP: 10 (ref 5–15)
AST: 34 U/L (ref 0–37)
BUN: 18 mg/dL (ref 6–23)
CALCIUM: 9.5 mg/dL (ref 8.4–10.5)
CO2: 19 mEq/L (ref 19–32)
Chloride: 113 mEq/L — ABNORMAL HIGH (ref 96–112)
Creatinine, Ser: 2.03 mg/dL — ABNORMAL HIGH (ref 0.50–1.35)
GFR calc non Af Amer: 34 mL/min — ABNORMAL LOW (ref >90)
GFR, EST AFRICAN AMERICAN: 39 mL/min — AB (ref >90)
GLUCOSE: 80 mg/dL (ref 70–99)
Potassium: 4 mEq/L (ref 3.7–5.3)
Sodium: 142 mEq/L (ref 137–147)
Total Bilirubin: 1.1 mg/dL (ref 0.3–1.2)
Total Protein: 7.4 g/dL (ref 6.0–8.3)

## 2014-03-17 LAB — CBC WITH DIFFERENTIAL/PLATELET
BASOS ABS: 0 10*3/uL (ref 0.0–0.1)
BASOS PCT: 0 % (ref 0–1)
EOS ABS: 0.2 10*3/uL (ref 0.0–0.7)
EOS PCT: 4 % (ref 0–5)
HCT: 30.8 % — ABNORMAL LOW (ref 39.0–52.0)
Hemoglobin: 10.4 g/dL — ABNORMAL LOW (ref 13.0–17.0)
Lymphocytes Relative: 10 % — ABNORMAL LOW (ref 12–46)
Lymphs Abs: 0.4 10*3/uL — ABNORMAL LOW (ref 0.7–4.0)
MCH: 31.5 pg (ref 26.0–34.0)
MCHC: 33.8 g/dL (ref 30.0–36.0)
MCV: 93.3 fL (ref 78.0–100.0)
MONOS PCT: 10 % (ref 3–12)
Monocytes Absolute: 0.4 10*3/uL (ref 0.1–1.0)
NEUTROS ABS: 3.4 10*3/uL (ref 1.7–7.7)
Neutrophils Relative %: 77 % (ref 43–77)
Platelets: 54 10*3/uL — ABNORMAL LOW (ref 150–400)
RBC: 3.3 MIL/uL — ABNORMAL LOW (ref 4.22–5.81)
RDW: 14.6 % (ref 11.5–15.5)
Smear Review: DECREASED
WBC: 4.5 10*3/uL (ref 4.0–10.5)

## 2014-03-17 LAB — CEA: CEA: 8.9 ng/mL — ABNORMAL HIGH (ref 0.0–5.0)

## 2014-03-17 LAB — MAGNESIUM: Magnesium: 2.3 mg/dL (ref 1.5–2.5)

## 2014-03-17 MED ORDER — CETUXIMAB CHEMO IV INJECTION 200 MG/100ML
400.0000 mg/m2 | Freq: Once | INTRAVENOUS | Status: AC
  Administered 2014-03-17: 900 mg via INTRAVENOUS
  Filled 2014-03-17: qty 450

## 2014-03-17 MED ORDER — HEPARIN SOD (PORK) LOCK FLUSH 100 UNIT/ML IV SOLN
500.0000 [IU] | Freq: Once | INTRAVENOUS | Status: AC | PRN
  Administered 2014-03-17: 500 [IU]

## 2014-03-17 MED ORDER — DIPHENHYDRAMINE HCL 50 MG/ML IJ SOLN
INTRAMUSCULAR | Status: AC
  Filled 2014-03-17: qty 1

## 2014-03-17 MED ORDER — HEPARIN SOD (PORK) LOCK FLUSH 100 UNIT/ML IV SOLN
INTRAVENOUS | Status: AC
  Filled 2014-03-17: qty 5

## 2014-03-17 MED ORDER — SODIUM CHLORIDE 0.9 % IJ SOLN
10.0000 mL | INTRAMUSCULAR | Status: DC | PRN
  Administered 2014-03-17: 10 mL

## 2014-03-17 MED ORDER — DIPHENHYDRAMINE HCL 50 MG/ML IJ SOLN
50.0000 mg | Freq: Once | INTRAMUSCULAR | Status: AC
  Administered 2014-03-17: 50 mg via INTRAVENOUS

## 2014-03-17 MED ORDER — SODIUM CHLORIDE 0.9 % IV SOLN
Freq: Once | INTRAVENOUS | Status: AC
  Administered 2014-03-17: 09:00:00 via INTRAVENOUS

## 2014-03-17 NOTE — Patient Instructions (Signed)
[facility] Discharge Instructions  RECOMMENDATIONS MADE BY THE CONSULTANT AND ANY TEST RESULTS WILL BE SENT TO YOUR REFERRING PHYSICIAN.  Follow up with the clinic as scheduled. Please call for any questions or concerns.   Thank you for choosing [facility] to provide your oncology and hematology care.  To afford each patient quality time with our providers, please arrive at least 15 minutes before your scheduled appointment time.  With your help, our goal is to use those 15 minutes to complete the necessary work-up to ensure our physicians have the information they need to help with your evaluation and healthcare recommendations.    Effective January 1st, 2014, we ask that you re-schedule your appointment with our physicians should you arrive 10 or more minutes late for your appointment.  We strive to give you quality time with our providers, and arriving late affects you and other patients whose appointments are after yours.    Again, thank you for choosing [facility].  Our hope is that these requests will decrease the amount of time that you wait before being seen by our physicians.       _____________________________________________________________  Should you have questions after your visit to [facility], please contact our office at [phone] between the hours of 8:30 a.m. and 4:30 p.m.  Voicemails left after 4:30 p.m. will not be returned until the following business day.  For prescription refill requests, have your pharmacy contact our office with your prescription refill request.    _______________________________________________________________  We hope that we have given you very good care.  You may receive a patient satisfaction survey in the mail, please complete it and return it as soon as possible.  We value your feedback!  _______________________________________________________________  Have you asked about  our STAR program?  STAR stands for Survivorship Training and Rehabilitation, and this is a nationally recognized cancer care program that focuses on survivorship and rehabilitation.  Cancer and cancer treatments may cause problems, such as, pain, making you feel tired and keeping you from doing the things that you need or want to do. Cancer rehabilitation can help. Our goal is to reduce these troubling effects and help you have the best quality of life possible.  You may receive a survey from a nurse that asks questions about your current state of health.  Based on the survey results, all eligible patients will be referred to the [facility] program for an evaluation so we can better serve you!  A frequently asked questions sheet is available upon request.

## 2014-03-17 NOTE — Progress Notes (Signed)
[name] Tolerated chemotherapy well today.  Discharged ambulatory with wife

## 2014-03-17 NOTE — Patient Instructions (Signed)
[facility] Discharge Instructions for Patients Receiving Chemotherapy  Today you received the following chemotherapy agents Erbitux first dose To help prevent nausea and vomiting after your treatment, we encourage you to take your nausea medication    If you develop nausea and vomiting that is not controlled by your nausea medication, call the clinic.   BELOW ARE SYMPTOMS THAT SHOULD BE REPORTED IMMEDIATELY:  *FEVER GREATER THAN 100.5 F  *CHILLS WITH OR WITHOUT FEVER  NAUSEA AND VOMITING THAT IS NOT CONTROLLED WITH YOUR NAUSEA MEDICATION  *UNUSUAL SHORTNESS OF BREATH  *UNUSUAL BRUISING OR BLEEDING  TENDERNESS IN MOUTH AND THROAT WITH OR WITHOUT PRESENCE OF ULCERS  *URINARY PROBLEMS  *BOWEL PROBLEMS       UNUSUAL RASH Items with * indicate a potential emergency and should be followed up as soon as possible.  Feel free to call the clinic you have any questions or concerns. The clinic phone number is [phone].   Cetuximab injection What is this medicine? CETUXIMAB (se TUX i mab) is a chemotherapy drug. It targets a specific protein within cancer cells and stops the cells from growing. It is used to treat colorectal cancer and head and neck cancer. This medicine may be used for other purposes; ask your health care provider or pharmacist if you have questions. COMMON BRAND NAME(S): Erbitux What should I tell my health care provider before I take this medicine? They need to know if you have any of these conditions: -heart disease -history of irregular heartbeat -history of low levels of calcium, magnesium, or potassium in the blood -lung or breathing disease, like asthma -an unusual or allergic reaction to cetuximab, other medicines, foods, dyes, or preservatives -pregnant or trying to get pregnant -breast-feeding How should I use this medicine? This drug is given as an infusion into a vein. It is administered in a hospital or clinic by a specially  trained health care professional. Talk to your pediatrician regarding the use of this medicine in children. Special care may be needed. Overdosage: If you think you have taken too much of this medicine contact a poison control center or emergency room at once. NOTE: This medicine is only for you. Do not share this medicine with others. What if I miss a dose? It is important not to miss your dose. Call your doctor or health care professional if you are unable to keep an appointment. What may interact with this medicine? Interactions are not expected. This list may not describe all possible interactions. Give your health care provider a list of all the medicines, herbs, non-prescription drugs, or dietary supplements you use. Also tell them if you smoke, drink alcohol, or use illegal drugs. Some items may interact with your medicine. What should I watch for while using this medicine? Visit your doctor or health care professional for regular checks on your progress. This drug may make you feel generally unwell. This is not uncommon, as chemotherapy can affect healthy cells as well as cancer cells. Report any side effects. Continue your course of treatment even though you feel ill unless your doctor tells you to stop. This medicine can make you more sensitive to the sun. Keep out of the sun while taking this medicine and for 2 months after the last dose. If you cannot avoid being in the sun, wear protective clothing and use sunscreen. Do not use sun lamps or tanning beds/booths. You may need blood work done while you are taking this medicine. In some cases, you may  be given additional medicines to help with side effects. Follow all directions for their use. Call your doctor or health care professional for advice if you get a fever, chills or sore throat, or other symptoms of a cold or flu. Do not treat yourself. This drug decreases your body's ability to fight infections. Try to avoid being around people  who are sick. Avoid taking products that contain aspirin, acetaminophen, ibuprofen, naproxen, or ketoprofen unless instructed by your doctor. These medicines may hide a fever. Do not become pregnant while taking this medicine. Women should inform their doctor if they wish to become pregnant or think they might be pregnant. There is a potential for serious side effects to an unborn child. Use adequate birth control methods. Avoid pregnancy for at least 6 months after your last dose. Talk to your health care professional or pharmacist for more information. Do not breast-feed an infant while taking this medicine or during the 2 months after your last dose. What side effects may I notice from receiving this medicine? Side effects that you should report to your doctor or health care professional as soon as possible: -allergic reactions like skin rash, itching or hives, swelling of the face, lips, or tongue -breathing problems -changes in vision -fast, irregular heartbeat -feeling faint or lightheaded, falls -fever, chills -mouth sores -redness, blistering, peeling or loosening of the skin, including inside the mouth -trouble passing urine or change in the amount of urine -unusually weak or tired Side effects that usually do not require medical attention (report to your doctor or health care professional if they continue or are bothersome): -changes in skin like acne, cracks, skin dryness -constipation -diarrhea -headache -nail changes -nausea, vomiting -stomach upset -weight loss This list may not describe all possible side effects. Call your doctor for medical advice about side effects. You may report side effects to FDA at 1-800-FDA-1088. Where should I keep my medicine? This drug is given in a hospital or clinic and will not be stored at home. NOTE: This sheet is a summary. It may not cover all possible information. If you have questions about this medicine, talk to your doctor, pharmacist,  or health care provider.  2015, [employer]vier/Gold Standard. (2013-08-28 16:14:34)

## 2014-03-18 ENCOUNTER — Telehealth (HOSPITAL_COMMUNITY): Payer: Self-pay | Admitting: *Deleted

## 2014-03-18 NOTE — Telephone Encounter (Signed)
24h call back. No side effects from the new medication, Erbitux, yesterday. Patient had a little bit of a headache last time but it eased off. No headache today. Pt has just been laying around today. He is doing ok. Patient instructed to call us for any questions, symptoms, or concerns. Patient was able to tell me 3 things that he would call the [facility] for and are as follows: chills, fever, rash. He then said that if it was the weekend he would go to the Emergency Department.

## 2014-03-20 ENCOUNTER — Other Ambulatory Visit (HOSPITAL_COMMUNITY): Payer: Self-pay | Admitting: *Deleted

## 2014-03-20 ENCOUNTER — Telehealth (HOSPITAL_COMMUNITY): Payer: Self-pay | Admitting: *Deleted

## 2014-03-20 DIAGNOSIS — C20 Malignant neoplasm of rectum: Secondary | ICD-10-CM

## 2014-03-20 DIAGNOSIS — C7801 Secondary malignant neoplasm of right lung: Secondary | ICD-10-CM

## 2014-03-20 NOTE — Telephone Encounter (Signed)
Pt called and left me a message letting me know that he was doing ok. He was just calling to give me an update.

## 2014-03-24 ENCOUNTER — Encounter (HOSPITAL_BASED_OUTPATIENT_CLINIC_OR_DEPARTMENT_OTHER): Payer: Managed Care, Other (non HMO)

## 2014-03-24 ENCOUNTER — Encounter (HOSPITAL_COMMUNITY): Payer: Self-pay

## 2014-03-24 VITALS — BP 113/57 | HR 55 | Temp 98.1°F | Resp 18 | Wt 209.3 lb

## 2014-03-24 DIAGNOSIS — C7800 Secondary malignant neoplasm of unspecified lung: Secondary | ICD-10-CM

## 2014-03-24 DIAGNOSIS — Z5112 Encounter for antineoplastic immunotherapy: Secondary | ICD-10-CM

## 2014-03-24 DIAGNOSIS — E538 Deficiency of other specified B group vitamins: Secondary | ICD-10-CM | POA: Diagnosis not present

## 2014-03-24 DIAGNOSIS — C7801 Secondary malignant neoplasm of right lung: Secondary | ICD-10-CM

## 2014-03-24 DIAGNOSIS — C229 Malignant neoplasm of liver, not specified as primary or secondary: Secondary | ICD-10-CM

## 2014-03-24 DIAGNOSIS — C20 Malignant neoplasm of rectum: Secondary | ICD-10-CM

## 2014-03-24 LAB — CBC WITH DIFFERENTIAL/PLATELET
BASOS ABS: 0 10*3/uL (ref 0.0–0.1)
BASOS PCT: 0 % (ref 0–1)
Eosinophils Absolute: 0.1 10*3/uL (ref 0.0–0.7)
Eosinophils Relative: 4 % (ref 0–5)
HCT: 28.3 % — ABNORMAL LOW (ref 39.0–52.0)
Hemoglobin: 9.6 g/dL — ABNORMAL LOW (ref 13.0–17.0)
Lymphocytes Relative: 11 % — ABNORMAL LOW (ref 12–46)
Lymphs Abs: 0.4 10*3/uL — ABNORMAL LOW (ref 0.7–4.0)
MCH: 32 pg (ref 26.0–34.0)
MCHC: 33.9 g/dL (ref 30.0–36.0)
MCV: 94.3 fL (ref 78.0–100.0)
Monocytes Absolute: 0.3 10*3/uL (ref 0.1–1.0)
Monocytes Relative: 7 % (ref 3–12)
NEUTROS PCT: 79 % — AB (ref 43–77)
Neutro Abs: 3.1 10*3/uL (ref 1.7–7.7)
PLATELETS: 64 10*3/uL — AB (ref 150–400)
RBC: 3 MIL/uL — AB (ref 4.22–5.81)
RDW: 14.9 % (ref 11.5–15.5)
WBC: 4 10*3/uL (ref 4.0–10.5)

## 2014-03-24 MED ORDER — SODIUM CHLORIDE 0.9 % IJ SOLN
10.0000 mL | INTRAMUSCULAR | Status: DC | PRN
  Administered 2014-03-24 (×2): 10 mL

## 2014-03-24 MED ORDER — CETUXIMAB CHEMO IV INJECTION 200 MG/100ML
250.0000 mg/m2 | Freq: Once | INTRAVENOUS | Status: AC
  Administered 2014-03-24: 500 mg via INTRAVENOUS
  Filled 2014-03-24: qty 250

## 2014-03-24 MED ORDER — DIPHENHYDRAMINE HCL 50 MG/ML IJ SOLN
50.0000 mg | Freq: Once | INTRAMUSCULAR | Status: AC
  Administered 2014-03-24: 50 mg via INTRAVENOUS
  Filled 2014-03-24: qty 1

## 2014-03-24 MED ORDER — HEPARIN SOD (PORK) LOCK FLUSH 100 UNIT/ML IV SOLN
500.0000 [IU] | Freq: Once | INTRAVENOUS | Status: AC | PRN
  Administered 2014-03-24: 500 [IU]
  Filled 2014-03-24: qty 5

## 2014-03-24 MED ORDER — SODIUM CHLORIDE 0.9 % IV SOLN
Freq: Once | INTRAVENOUS | Status: AC
  Administered 2014-03-24: 09:00:00 via INTRAVENOUS

## 2014-03-24 NOTE — Progress Notes (Signed)
[name] Kilts, MD [name]  Adenocarcinoma of rectum  CURRENT THERAPY: Erbitux single-agent beginning on (03/17/14)  INTERVAL HISTORY: [name] 63 y.o. 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     Adenocarcinoma of rectum   07/07/2010 Initial Diagnosis Adenocarcinoma of rectum   02/24/2014 Progression CT CAP-  Findings within the chest are worrisome for recurrent tumor with bilateral pulmonary metastasis.    Chemotherapy Erbitux single agent   I personally reviewed and went over laboratory results with the patient.  The results are noted within this dictation.  [name] is tolerating therapy well.  He notes a headache 1 day following chemotherapy that is well controlled with Tylenol.  He reports that the headache is very mild.    He has an acne-like rash on his face and this is Erbitux-induced.  It is very mild and he is encouraged to continue using the facial cream that he was given as part f the Erbitux starter kit.  If it worsens, I will treat with Doxycycline, but for now, it is not indicated.  Oncologically, he denies any complaints and ROS questioning is negative.   Past Medical History  Diagnosis Date  . Pancytopenia, acquired 12/04/2010  . B12 deficiency 12/07/2010  . Kidney stones   . CAD (coronary artery disease)   . Anemia   . History of radiation therapy 06/19/07 -07/27/07    whole pelvis  . Hx of radiation therapy 08/06/12,08/09/12,& 08/13/12    rul [id number]/89f  . Cancer     Colon ca dx 2008 surg/rad/chemo  . Lung cancer   . FH: chemotherapy   . Myocardial infarction   . Rectal bleeding 09/20/2013    From recurrent rectal mass/recurrent rectal adenocarcinoma  . Acute blood loss anemia 09/27/2013  . 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  2.7 cm primary,  moderately differentiated with 3/8 positive lymph nodes without LVI.  Surgery was on 04/22/2007 and was treated postoperatively with radiation and concomitant capecitabine and then 4 cycles of a planned 6 with oxaliplatin and capecitabine.  His counts would not allow treatment of  . Hydronephrosis of left kidney 09/24/2013    Kidney stones; s/p cystoscopy and stent  . Gross hematuria 09/25/2103  . Lung metastases 07/09/2013  . Secondary malignancy of right lung 07/09/2013    has DIABETES MELLITUS, TYPE II; HYPERCHOLESTEROLEMIA; ANEMIA, CHRONIC; CORONARY ARTERY DISEASE; SLEEP APNEA; Adenocarcinoma of rectum; Pancytopenia, acquired; B12 deficiency; Port catheter in place; Hx of radiation therapy; Thrombocytopenia; Secondary malignancy of right lung; Rectal bleeding; ARF (acute renal failure); Hydronephrosis; Hydronephrosis of left kidney; GI bleeding; Acute blood loss anemia; Urinary retention; and Gross hematuria on his problem list.     is allergic to daypro and xeloda.  Mr. MHaskinhad no medications administered during this visit.  Past Surgical History  Procedure Laterality Date  . Ileostomy  12/07/2010    Procedure: ILEOSTOMY;  Surgeon: BDonato Heinz  Location: [facility];  Service: General;  Laterality: N/A;  Diverting Ileostomy Lysis of adhesions Exploratory Laparotomy  . Lung biopsy  10/27/10    rt lobe- adenocarcinoma  . Cholecystectomy    . Cataract extraction w/ intraocular lens implant  2007    bil  . Lithotripsy  2002  . Coronary angioplasty with stent placement  2003  stenting x 2  . Colon resection  2008    x2  . Esophagogastroduodenoscopy N/A 05/02/2013    Dr. Gala Romney- polypoid antral fold, bx= reactive gastropathy  . Givens capsule study N/A 05/02/2013    Procedure: GIVENS CAPSULE STUDY;  Surgeon: Daneil Dolin, MD;  Location: [facility];  Service: Endoscopy;  Laterality: N/A;  . Colonoscopy N/A 09/20/2013    SLF:A near circumferential fungating mass with friable surfaces  was found in the rectum.  ACTIVELY OOZING.  CLOTS SEEN IN LUMEN AND ASPIRATED.  Multiple biopsies were performed using cold forceps.  Thermal therapy(BICAP 7 Fr 25W) was used to ATTEMPT TO control bleeding.    HEMOSTASIS NOT ACHEIVED.  Marland Kitchen Cystoscopy w/ ureteral stent placement Left 09/24/2013    Procedure: CYSTOSCOPY WITH LEFT RETROGRADE PYELOGRAM; LEFT URETERAL STENT PLACEMENT; REMOVAL OF SMALL BLADDER CALCULI;  Surgeon: Marissa Nestle, MD;  Location: [facility];  Service: Urology;  Laterality: Left;  . Flexible sigmoidoscopy N/A 09/25/2013    WVP:XTGGYIRSW exophytic rectal mass most likely representing recurrent adenocarcinoma status post biopsy    Denies any headaches, dizziness, double vision, fevers, chills, night sweats, nausea, vomiting, diarrhea, constipation, chest pain, heart palpitations, shortness of breath, blood in stool, black tarry stool, urinary pain, urinary burning, urinary frequency, hematuria.   PHYSICAL EXAMINATION  ECOG PERFORMANCE STATUS: 0 - Asymptomatic  Filed Vitals:   03/27/14 1000  BP: 130/68  Pulse: 65  Temp: 98.2 F (36.8 C)  Resp: 20    GENERAL:alert, no distress, well nourished, well developed, comfortable, cooperative and smiling SKIN: skin color, texture, turgor are normal, acne-like rash on bridge of nose and upper cheeks bilaterally HEAD: Normocephalic, No masses, lesions, tenderness or abnormalities EYES: normal, PERRLA, EOMI, Conjunctiva are pink and non-injected EARS: External ears normal OROPHARYNX:lips, buccal mucosa, and tongue normal and mucous membranes are moist  NECK: supple, no adenopathy, thyroid normal size, non-tender, without nodularity, no stridor, non-tender, trachea midline LYMPH:  no palpable lymphadenopathy, no hepatosplenomegaly BREAST:not examined LUNGS: clear to auscultation  HEART: regular rate & rhythm, no murmurs and no gallops ABDOMEN:abdomen soft, normal bowel sounds and colostomy producing stool appropriately BACK: Back  symmetric, no curvature., No CVA tenderness EXTREMITIES:less then 2 second capillary refill, no joint deformities, effusion, or inflammation, no skin discoloration, no clubbing, no cyanosis  NEURO: alert & oriented x 3 with fluent speech, no focal motor/sensory deficits, gait normal   LABORATORY DATA: CBC    Component Value Date/Time   WBC 4.0 03/24/2014 0848   RBC 3.00* 03/24/2014 0848   HGB 9.6* 03/24/2014 0848   HCT 28.3* 03/24/2014 0848   PLT 64* 03/24/2014 0848   MCV 94.3 03/24/2014 0848   MCH 32.0 03/24/2014 0848   MCHC 33.9 03/24/2014 0848   RDW 14.9 03/24/2014 0848   LYMPHSABS 0.4* 03/24/2014 0848   MONOABS 0.3 03/24/2014 0848   EOSABS 0.1 03/24/2014 0848   BASOSABS 0.0 03/24/2014 0848      Chemistry      Component Value Date/Time   NA 142 03/17/2014 0832   K 4.0 03/17/2014 0832   CL 113* 03/17/2014 0832   CO2 19 03/17/2014 0832   BUN 18 03/17/2014 0832   CREATININE 2.03* 03/17/2014 0832      Component Value Date/Time   CALCIUM 9.5 03/17/2014 0832   ALKPHOS 124* 03/17/2014 0832   AST 34 03/17/2014 0832   ALT 25 03/17/2014 0832   BILITOT 1.1 03/17/2014 0832     Lab Results  Component Value Date   CEA  8.9* 03/17/2014     ASSESSMENT:  1. Progressive metastatic rectal cancer (KRAS wild type) beginning systemic chemotherapy with single-agent Erbitux on 03/17/2014. 2. Right apex of lung nodule (7.5 mm) and left upper lobe lesion (9 mm), concerning for malignancy treated with SBRT by Dr. [name] on 2/23, 2/25, 3/3, and 08/02/2013.  3. Suspicious local recurrence at anastomotic site, S/P radiation by Dr. [name]  4. Pancytopenia, stable  5. Splenomegaly and cirrhosis of liver  6. Pernicious anemia, on Vitamin B 12 IM replacmenet  7. Small bowel erosions per camera capsule study by Dr. Gala Romney.  8. Recurrent renal calculi, referral to urology made.  9. Iron deficiency anemia secondary to blood loss and malabsorption from chronic PPI use. S/P IV Feraheme on 6/24 and  11/27/2013. 10. Erbitux-induced acne rash  Patient Active Problem List   Diagnosis Date Noted  . Urinary retention 09/28/2013  . Gross hematuria 09/28/2013  . Acute blood loss anemia 09/27/2013  . GI bleeding 09/25/2013  . Hydronephrosis of left kidney 09/24/2013  . Hydronephrosis 09/22/2013  . Rectal bleeding 09/20/2013  . ARF (acute renal failure) 09/20/2013  . Secondary malignancy of right lung 07/09/2013  . Thrombocytopenia 05/01/2013  . Hx of radiation therapy   . Port catheter in place 09/04/2012  . B12 deficiency 12/07/2010  . Pancytopenia, acquired 12/04/2010  . DIABETES MELLITUS, TYPE II 07/07/2010  . HYPERCHOLESTEROLEMIA 07/07/2010  . ANEMIA, CHRONIC 07/07/2010  . CORONARY ARTERY DISEASE 07/07/2010  . SLEEP APNEA 07/07/2010  . Adenocarcinoma of rectum 07/07/2010     PLAN:  1. I personally reviewed and went over laboratory results with the patient.  The results are noted within this dictation. 2. Pre-chemo labs as planned 3. Erbitux infusion today as planned. 4. Return in 2 weeks for follow-up   THERAPY PLAN:  We will treat him with single-agent Erbitux. At some time, Irinotecan can be added if needed.   All questions were answered. The patient knows to call the clinic with any problems, questions or concerns. We can certainly see the patient much sooner if necessary.  Patient and plan discussed with Dr. Farrel Gobble and he is in agreement with the aforementioned.   KEFALAS,THOMAS 03/27/2014

## 2014-03-24 NOTE — Patient Instructions (Signed)
[facility] Discharge Instructions for Patients Receiving Chemotherapy  Today you received the following chemotherapy agents erbitux Please call the clinic if you have any questions or concerns  To help prevent nausea and vomiting after your treatment, we encourage you to take your nausea medication}    If you develop nausea and vomiting that is not controlled by your nausea medication, call the clinic. If it is after clinic hours your family physician or the after hours number for the clinic or go to the Emergency Department.   BELOW ARE SYMPTOMS THAT SHOULD BE REPORTED IMMEDIATELY:  *FEVER GREATER THAN 101.0 F  *CHILLS WITH OR WITHOUT FEVER  NAUSEA AND VOMITING THAT IS NOT CONTROLLED WITH YOUR NAUSEA MEDICATION  *UNUSUAL SHORTNESS OF BREATH  *UNUSUAL BRUISING OR BLEEDING  TENDERNESS IN MOUTH AND THROAT WITH OR WITHOUT PRESENCE OF ULCERS  *URINARY PROBLEMS  *BOWEL PROBLEMS  UNUSUAL RASH Items with * indicate a potential emergency and should be followed up as soon as possible.  One of the nurses will contact you 24 hours after your treatment. Please let the nurse know about any problems that you may have experienced. Feel free to call the clinic you have any questions or concerns. The clinic phone number is [phone].   I have been informed and understand all the instructions given to me. I know to contact the clinic, my physician, or go to the Emergency Department if any problems should occur. I do not have any questions at this time, but understand that I may call the clinic during office hours or the Patient Navigator at [phone] should I have any questions or need assistance in obtaining follow up care.    Cetuximab injection What is this medicine? CETUXIMAB (se TUX i mab) is a chemotherapy drug. It targets a specific protein within cancer cells and stops the cells from growing. It is used to treat colorectal cancer and head and neck cancer. This  medicine may be used for other purposes; ask your health care provider or pharmacist if you have questions. COMMON BRAND NAME(S): Erbitux What should I tell my health care provider before I take this medicine? They need to know if you have any of these conditions: -heart disease -history of irregular heartbeat -history of low levels of calcium, magnesium, or potassium in the blood -lung or breathing disease, like asthma -an unusual or allergic reaction to cetuximab, other medicines, foods, dyes, or preservatives -pregnant or trying to get pregnant -breast-feeding How should I use this medicine? This drug is given as an infusion into a vein. It is administered in a hospital or clinic by a specially trained health care professional. Talk to your pediatrician regarding the use of this medicine in children. Special care may be needed. Overdosage: If you think you have taken too much of this medicine contact a poison control center or emergency room at once. NOTE: This medicine is only for you. Do not share this medicine with others. What if I miss a dose? It is important not to miss your dose. Call your doctor or health care professional if you are unable to keep an appointment. What may interact with this medicine? Interactions are not expected. This list may not describe all possible interactions. Give your health care provider a list of all the medicines, herbs, non-prescription drugs, or dietary supplements you use. Also tell them if you smoke, drink alcohol, or use illegal drugs. Some items may interact with your medicine. What should I watch for  while using this medicine? Visit your doctor or health care professional for regular checks on your progress. This drug may make you feel generally unwell. This is not uncommon, as chemotherapy can affect healthy cells as well as cancer cells. Report any side effects. Continue your course of treatment even though you feel ill unless your doctor tells  you to stop. This medicine can make you more sensitive to the sun. Keep out of the sun while taking this medicine and for 2 months after the last dose. If you cannot avoid being in the sun, wear protective clothing and use sunscreen. Do not use sun lamps or tanning beds/booths. You may need blood work done while you are taking this medicine. In some cases, you may be given additional medicines to help with side effects. Follow all directions for their use. Call your doctor or health care professional for advice if you get a fever, chills or sore throat, or other symptoms of a cold or flu. Do not treat yourself. This drug decreases your body's ability to fight infections. Try to avoid being around people who are sick. Avoid taking products that contain aspirin, acetaminophen, ibuprofen, naproxen, or ketoprofen unless instructed by your doctor. These medicines may hide a fever. Do not become pregnant while taking this medicine. Women should inform their doctor if they wish to become pregnant or think they might be pregnant. There is a potential for serious side effects to an unborn child. Use adequate birth control methods. Avoid pregnancy for at least 6 months after your last dose. Talk to your health care professional or pharmacist for more information. Do not breast-feed an infant while taking this medicine or during the 2 months after your last dose. What side effects may I notice from receiving this medicine? Side effects that you should report to your doctor or health care professional as soon as possible: -allergic reactions like skin rash, itching or hives, swelling of the face, lips, or tongue -breathing problems -changes in vision -fast, irregular heartbeat -feeling faint or lightheaded, falls -fever, chills -mouth sores -redness, blistering, peeling or loosening of the skin, including inside the mouth -trouble passing urine or change in the amount of urine -unusually weak or tired Side  effects that usually do not require medical attention (report to your doctor or health care professional if they continue or are bothersome): -changes in skin like acne, cracks, skin dryness -constipation -diarrhea -headache -nail changes -nausea, vomiting -stomach upset -weight loss This list may not describe all possible side effects. Call your doctor for medical advice about side effects. You may report side effects to FDA at 1-800-FDA-1088. Where should I keep my medicine? This drug is given in a hospital or clinic and will not be stored at home. NOTE: This sheet is a summary. It may not cover all possible information. If you have questions about this medicine, talk to your doctor, pharmacist, or health care provider.  2015, [employer]vier/Gold Standard. (2013-08-28 16:14:34)

## 2014-03-24 NOTE — Progress Notes (Signed)
[name] Tolerated chemotherapy well today

## 2014-03-27 ENCOUNTER — Encounter (HOSPITAL_BASED_OUTPATIENT_CLINIC_OR_DEPARTMENT_OTHER): Payer: Managed Care, Other (non HMO) | Admitting: Oncology

## 2014-03-27 VITALS — BP 130/68 | HR 65 | Temp 98.2°F | Resp 20 | Wt 211.9 lb

## 2014-03-27 DIAGNOSIS — C20 Malignant neoplasm of rectum: Secondary | ICD-10-CM

## 2014-03-27 DIAGNOSIS — L271 Localized skin eruption due to drugs and medicaments taken internally: Secondary | ICD-10-CM

## 2014-03-27 DIAGNOSIS — R918 Other nonspecific abnormal finding of lung field: Secondary | ICD-10-CM

## 2014-03-27 NOTE — Patient Instructions (Signed)
[facility] Discharge Instructions  RECOMMENDATIONS MADE BY THE CONSULTANT AND ANY TEST RESULTS WILL BE SENT TO YOUR REFERRING PHYSICIAN.  EXAM FINDINGS BY THE PHYSICIAN TODAY AND SIGNS OR SYMPTOMS TO REPORT TO CLINIC OR PRIMARY PHYSICIAN: Exam and findings as discussed by Meriel Flavors.  MEDICATIONS PRESCRIBED:  Continue ointment to face for now. If the rash gets worse we will give you something different.  INSTRUCTIONS/FOLLOW-UP: Return as scheduled.  Thank you for choosing [facility] to provide your oncology and hematology care.  To afford each patient quality time with our providers, please arrive at least 15 minutes before your scheduled appointment time.  With your help, our goal is to use those 15 minutes to complete the necessary work-up to ensure our physicians have the information they need to help with your evaluation and healthcare recommendations.    Effective January 1st, 2014, we ask that you re-schedule your appointment with our physicians should you arrive 10 or more minutes late for your appointment.  We strive to give you quality time with our providers, and arriving late affects you and other patients whose appointments are after yours.    Again, thank you for choosing [facility].  Our hope is that these requests will decrease the amount of time that you wait before being seen by our physicians.       _____________________________________________________________  Should you have questions after your visit to [facility], please contact our office at [phone] between the hours of 8:30 a.m. and 4:30 p.m.  Voicemails left after 4:30 p.m. will not be returned until the following business day.  For prescription refill requests, have your pharmacy contact our office with your prescription refill request.    _______________________________________________________________  We hope that we have given you very  good care.  You may receive a patient satisfaction survey in the mail, please complete it and return it as soon as possible.  We value your feedback!  _______________________________________________________________  Have you asked about our STAR program?  STAR stands for Survivorship Training and Rehabilitation, and this is a nationally recognized cancer care program that focuses on survivorship and rehabilitation.  Cancer and cancer treatments may cause problems, such as, pain, making you feel tired and keeping you from doing the things that you need or want to do. Cancer rehabilitation can help. Our goal is to reduce these troubling effects and help you have the best quality of life possible.  You may receive a survey from a nurse that asks questions about your current state of health.  Based on the survey results, all eligible patients will be referred to the [facility] program for an evaluation so we can better serve you!  A frequently asked questions sheet is available upon request.

## 2014-03-31 ENCOUNTER — Encounter (HOSPITAL_COMMUNITY): Payer: Managed Care, Other (non HMO) | Attending: Oncology

## 2014-03-31 DIAGNOSIS — E538 Deficiency of other specified B group vitamins: Secondary | ICD-10-CM | POA: Diagnosis present

## 2014-03-31 DIAGNOSIS — C7801 Secondary malignant neoplasm of right lung: Secondary | ICD-10-CM

## 2014-03-31 DIAGNOSIS — Z5111 Encounter for antineoplastic chemotherapy: Secondary | ICD-10-CM

## 2014-03-31 DIAGNOSIS — D619 Aplastic anemia, unspecified: Secondary | ICD-10-CM | POA: Diagnosis present

## 2014-03-31 DIAGNOSIS — C20 Malignant neoplasm of rectum: Secondary | ICD-10-CM | POA: Insufficient documentation

## 2014-03-31 LAB — COMPREHENSIVE METABOLIC PANEL
ALBUMIN: 3 g/dL — AB (ref 3.5–5.2)
ALT: 39 U/L (ref 0–53)
ANION GAP: 9 (ref 5–15)
AST: 46 U/L — ABNORMAL HIGH (ref 0–37)
Alkaline Phosphatase: 111 U/L (ref 39–117)
BILIRUBIN TOTAL: 0.8 mg/dL (ref 0.3–1.2)
BUN: 17 mg/dL (ref 6–23)
CHLORIDE: 113 meq/L — AB (ref 96–112)
CO2: 18 mEq/L — ABNORMAL LOW (ref 19–32)
CREATININE: 1.86 mg/dL — AB (ref 0.50–1.35)
Calcium: 9 mg/dL (ref 8.4–10.5)
GFR calc Af Amer: 43 mL/min — ABNORMAL LOW (ref >90)
GFR calc non Af Amer: 37 mL/min — ABNORMAL LOW (ref >90)
GLUCOSE: 95 mg/dL (ref 70–99)
POTASSIUM: 3.9 meq/L (ref 3.7–5.3)
Sodium: 140 mEq/L (ref 137–147)
Total Protein: 6.7 g/dL (ref 6.0–8.3)

## 2014-03-31 LAB — CBC WITH DIFFERENTIAL/PLATELET
BASOS ABS: 0 10*3/uL (ref 0.0–0.1)
Basophils Relative: 0 % (ref 0–1)
EOS PCT: 3 % (ref 0–5)
Eosinophils Absolute: 0.1 10*3/uL (ref 0.0–0.7)
HCT: 27.7 % — ABNORMAL LOW (ref 39.0–52.0)
HEMOGLOBIN: 9.1 g/dL — AB (ref 13.0–17.0)
Lymphocytes Relative: 9 % — ABNORMAL LOW (ref 12–46)
Lymphs Abs: 0.4 10*3/uL — ABNORMAL LOW (ref 0.7–4.0)
MCH: 31.6 pg (ref 26.0–34.0)
MCHC: 32.9 g/dL (ref 30.0–36.0)
MCV: 96.2 fL (ref 78.0–100.0)
MONOS PCT: 7 % (ref 3–12)
Monocytes Absolute: 0.3 10*3/uL (ref 0.1–1.0)
Neutro Abs: 3.4 10*3/uL (ref 1.7–7.7)
Neutrophils Relative %: 81 % — ABNORMAL HIGH (ref 43–77)
Platelets: 43 10*3/uL — ABNORMAL LOW (ref 150–400)
RBC: 2.88 MIL/uL — ABNORMAL LOW (ref 4.22–5.81)
RDW: 15.5 % (ref 11.5–15.5)
WBC: 4.2 10*3/uL (ref 4.0–10.5)

## 2014-03-31 LAB — MAGNESIUM: Magnesium: 2.1 mg/dL (ref 1.5–2.5)

## 2014-03-31 MED ORDER — SODIUM CHLORIDE 0.9 % IV SOLN
Freq: Once | INTRAVENOUS | Status: AC
  Administered 2014-03-31: 09:00:00 via INTRAVENOUS

## 2014-03-31 MED ORDER — DIPHENHYDRAMINE HCL 50 MG/ML IJ SOLN
50.0000 mg | Freq: Once | INTRAMUSCULAR | Status: AC
  Administered 2014-03-31: 50 mg via INTRAVENOUS
  Filled 2014-03-31: qty 1

## 2014-03-31 MED ORDER — CETUXIMAB CHEMO IV INJECTION 200 MG/100ML
250.0000 mg/m2 | Freq: Once | INTRAVENOUS | Status: AC
  Administered 2014-03-31: 500 mg via INTRAVENOUS
  Filled 2014-03-31: qty 250

## 2014-03-31 MED ORDER — HEPARIN SOD (PORK) LOCK FLUSH 100 UNIT/ML IV SOLN
500.0000 [IU] | Freq: Once | INTRAVENOUS | Status: AC | PRN
  Administered 2014-03-31: 500 [IU]
  Filled 2014-03-31: qty 5

## 2014-03-31 MED ORDER — SODIUM CHLORIDE 0.9 % IJ SOLN
10.0000 mL | INTRAMUSCULAR | Status: DC | PRN
  Administered 2014-03-31: 10 mL
  Filled 2014-03-31: qty 10

## 2014-03-31 NOTE — Patient Instructions (Signed)
[facility] Discharge Instructions for Patients Receiving Chemotherapy  Today you received the following chemotherapy agents weekly Erbitux.  To help prevent nausea and vomiting after your treatment, we encourage you to take your nausea medication as instructed. If you develop nausea and vomiting that is not controlled by your nausea medication, call the clinic. If it is after clinic hours your family physician or the after hours number for the clinic or go to the Emergency Department.   BELOW ARE SYMPTOMS THAT SHOULD BE REPORTED IMMEDIATELY:  *FEVER GREATER THAN 101.0 F  *CHILLS WITH OR WITHOUT FEVER  NAUSEA AND VOMITING THAT IS NOT CONTROLLED WITH YOUR NAUSEA MEDICATION  *UNUSUAL SHORTNESS OF BREATH  *UNUSUAL BRUISING OR BLEEDING  TENDERNESS IN MOUTH AND THROAT WITH OR WITHOUT PRESENCE OF ULCERS  *URINARY PROBLEMS  *BOWEL PROBLEMS  UNUSUAL RASH Items with * indicate a potential emergency and should be followed up as soon as possible.  I have been informed and understand all the instructions given to me. I know to contact the clinic, my physician, or go to the Emergency Department if any problems should occur. I do not have any questions at this time, but understand that I may call the clinic during office hours or the Patient Navigator at [phone] should I have any questions or need assistance in obtaining follow up care.    __________________________________________  _____________  __________ Signature of Patient or Authorized Representative            Date                   Time    __________________________________________ Nurse's Signature

## 2014-03-31 NOTE — Progress Notes (Signed)
Tolerated chemo well. 

## 2014-04-05 NOTE — Progress Notes (Signed)
[name] Kilts, MD [place] [address]  Adenocarcinoma of rectum  Secondary malignancy of right lung  Acneiform rash - Plan: clindamycin (CLEOCIN-T) 1 % lotion  CURRENT THERAPY: Erbitux single-agent beginning on (03/17/14)  INTERVAL HISTORY: [name] 63 y.o. 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     Adenocarcinoma of rectum   07/07/2010 Initial Diagnosis Adenocarcinoma of rectum   02/24/2014 Progression CT CAP-  Findings within the chest are worrisome for recurrent tumor with bilateral pulmonary metastasis.   03/17/2014 -  Chemotherapy Erbitux single agent    I personally reviewed and went over laboratory results with the patient.  The results are noted within this dictation.  He is tolerating therapy without any nausea or vomiting.  He is resting occasionally throughout the day, but this is not interfering with QOL.    I provided him education regarding Erbitux-induced acne rash.  He is educated to continue with the provided moisturizing cream, and since it is a little worse compared to last week, I will prescribe an antibiotic cream/ointment.  Oncologically, he denies any complaints and ROS questioning is negative.    Past Medical History  Diagnosis Date  . Pancytopenia, acquired 12/04/2010  . B12 deficiency 12/07/2010  . Kidney stones   . CAD (coronary artery disease)   . Anemia   . History of radiation therapy 06/19/07 -07/27/07    whole pelvis  . Hx of radiation therapy 08/06/12,08/09/12,& 08/13/12    rul 54GY/9f  . Cancer     Colon ca dx 2008 surg/rad/chemo  . Lung cancer   . FH: chemotherapy   . Myocardial infarction   . Rectal bleeding 09/20/2013    From recurrent rectal mass/recurrent rectal adenocarcinoma  . Acute blood loss anemia 09/27/2013  . Adenocarcinoma of rectum 07/07/2010    Qualifier: History of  By: JRonnald RampFNP-BC, Kandice L  Initially presented  with Stage III adeno of colon.  2.7 cm primary, moderately differentiated with 3/8 positive lymph nodes without LVI.  Surgery was on 04/22/2007 and was treated postoperatively with radiation and concomitant capecitabine and then 4 cycles of a planned 6 with oxaliplatin and capecitabine.  His counts would not allow treatment of  . Hydronephrosis of left kidney 09/24/2013    Kidney stones; s/p cystoscopy and stent  . Gross hematuria 09/25/2103  . Lung metastases 07/09/2013  . Secondary malignancy of right lung 07/09/2013    has DIABETES MELLITUS, TYPE II; HYPERCHOLESTEROLEMIA; ANEMIA, CHRONIC; CORONARY ARTERY DISEASE; SLEEP APNEA; Adenocarcinoma of rectum; Pancytopenia, acquired; B12 deficiency; Port catheter in place; Hx of radiation therapy; Thrombocytopenia; Secondary malignancy of right lung; Rectal bleeding; ARF (acute renal failure); Hydronephrosis; Hydronephrosis of left kidney; GI bleeding; Acute blood loss anemia; Urinary retention; and Gross hematuria on his problem list.     is allergic to daypro and xeloda.  [name] not currently have medications on file.  Past Surgical History  Procedure Laterality Date  . Ileostomy  12/07/2010    Procedure: ILEOSTOMY;  Surgeon: BDonato Heinz  Location: [facility];  Service: General;  Laterality: N/A;  Diverting Ileostomy Lysis of adhesions Exploratory Laparotomy  . Lung biopsy  10/27/10    rt lobe- adenocarcinoma  . Cholecystectomy    . Cataract extraction w/ intraocular lens implant  2007    bil  . Lithotripsy  2002  . Coronary angioplasty with stent placement  2003    stenting x  2  . Colon resection  2008    x2  . Esophagogastroduodenoscopy N/A 05/02/2013    Dr. Gala Romney- polypoid antral fold, bx= reactive gastropathy  . Givens capsule study N/A 05/02/2013    Procedure: GIVENS CAPSULE STUDY;  Surgeon: Daneil Dolin, MD;  Location: [facility] SUITE;  Service: Endoscopy;  Laterality: N/A;  . Colonoscopy N/A 09/20/2013    SLF:A near  circumferential fungating mass with friable surfaces was found in the rectum.  ACTIVELY OOZING.  CLOTS SEEN IN LUMEN AND ASPIRATED.  Multiple biopsies were performed using cold forceps.  Thermal therapy(BICAP 7 Fr 25W) was used to ATTEMPT TO control bleeding.    HEMOSTASIS NOT ACHEIVED.  Marland Kitchen Cystoscopy w/ ureteral stent placement Left 09/24/2013    Procedure: CYSTOSCOPY WITH LEFT RETROGRADE PYELOGRAM; LEFT URETERAL STENT PLACEMENT; REMOVAL OF SMALL BLADDER CALCULI;  Surgeon: Marissa Nestle, MD;  Location: [facility];  Service: Urology;  Laterality: Left;  . Flexible sigmoidoscopy N/A 09/25/2013    LOV:FIEPPIRJJ exophytic rectal mass most likely representing recurrent adenocarcinoma status post biopsy    Senies any headaches, dizziness, double vision, fevers, chills, night sweats, nausea, vomiting, diarrhea, constipation, chest pain, heart palpitations, shortness of breath, blood in stool, black tarry stool, urinary pain, urinary burning, urinary frequency, hematuria.   PHYSICAL EXAMINATION  ECOG PERFORMANCE STATUS: 1 - Symptomatic but completely ambulatory  Filed Vitals:   04/07/14 0900  BP: 128/69  Pulse: 66  Temp: 97.5 F (36.4 C)  Resp: 18    GENERAL:alert, no distress, well nourished, well developed, comfortable, cooperative and smiling SKIN: skin color, texture, turgor are normal, acne-like rash on nose, and cheeks.  Also on upper chest. HEAD: Normocephalic, No masses, lesions, tenderness or abnormalities EYES: normal, PERRLA, EOMI, Conjunctiva are pink and non-injected EARS: External ears normal OROPHARYNX:lips, buccal mucosa, and tongue normal and mucous membranes are moist  NECK: supple, trachea midline LYMPH:  not examined BREAST:not examined LUNGS: not examined HEART: not examined ABDOMEN:not examined BACK: Back symmetric, no curvature. EXTREMITIES:less then 2 second capillary refill, no cyanosis  NEURO: alert & oriented x 3 with fluent speech, no focal motor/sensory  deficits   LABORATORY DATA: CBC    Component Value Date/Time   WBC 4.2 04/07/2014 0927   RBC 2.85* 04/07/2014 0927   HGB 9.3* 04/07/2014 0927   HCT 27.9* 04/07/2014 0927   PLT 40* 04/07/2014 0927   MCV 97.9 04/07/2014 0927   MCH 32.6 04/07/2014 0927   MCHC 33.3 04/07/2014 0927   RDW 15.0 04/07/2014 0927   LYMPHSABS 0.4* 04/07/2014 0927   MONOABS 0.3 04/07/2014 0927   EOSABS 0.1 04/07/2014 0927   BASOSABS 0.0 04/07/2014 0927      Chemistry      Component Value Date/Time   NA 140 03/31/2014 0901   K 3.9 03/31/2014 0901   CL 113* 03/31/2014 0901   CO2 18* 03/31/2014 0901   BUN 17 03/31/2014 0901   CREATININE 1.86* 03/31/2014 0901      Component Value Date/Time   CALCIUM 9.0 03/31/2014 0901   ALKPHOS 111 03/31/2014 0901   AST 46* 03/31/2014 0901   ALT 39 03/31/2014 0901   BILITOT 0.8 03/31/2014 0901     Lab Results  Component Value Date   CEA 8.9* 03/17/2014     ASSESSMENT:  1. Progressive metastatic rectal cancer (KRAS wild type) beginning systemic chemotherapy with single-agent Erbitux on 03/17/2014. 2. Right apex of lung nodule (7.5 mm) and left upper lobe lesion (9 mm), concerning for malignancy treated with SBRT  by Dr. [name] on 2/23, 2/25, 3/3, and 08/02/2013.  3. Suspicious local recurrence at anastomotic site, S/P radiation by Dr. [name]  4. Pancytopenia, stable  5. Splenomegaly and cirrhosis of liver  6. Pernicious anemia, on Vitamin B 12 IM replacmenet  7. Small bowel erosions per camera capsule study by Dr. Gala Romney.  8. Recurrent renal calculi, referral to urology made.  9. Iron deficiency anemia secondary to blood loss and malabsorption from chronic PPI use. S/P IV Feraheme on 6/24 and 11/27/2013. 10. Erbitux-induced acne rash  Patient Active Problem List   Diagnosis Date Noted  . Urinary retention 09/28/2013  . Gross hematuria 09/28/2013  . Acute blood loss anemia 09/27/2013  . GI bleeding 09/25/2013  . Hydronephrosis of left kidney  09/24/2013  . Hydronephrosis 09/22/2013  . Rectal bleeding 09/20/2013  . ARF (acute renal failure) 09/20/2013  . Secondary malignancy of right lung 07/09/2013  . Thrombocytopenia 05/01/2013  . Hx of radiation therapy   . Port catheter in place 09/04/2012  . B12 deficiency 12/07/2010  . Pancytopenia, acquired 12/04/2010  . DIABETES MELLITUS, TYPE II 07/07/2010  . HYPERCHOLESTEROLEMIA 07/07/2010  . ANEMIA, CHRONIC 07/07/2010  . CORONARY ARTERY DISEASE 07/07/2010  . SLEEP APNEA 07/07/2010  . Adenocarcinoma of rectum 07/07/2010     PLAN:  1. I personally reviewed and went over laboratory results with the patient.  The results are noted within this dictation. 2. Pre-chemo labs as planned 3. Chemotherapy as planned 4. Rx for Cleocin for acne rash 5. Return in 2 weeks for follow-up   THERAPY PLAN:  We will treat him with single-agent Erbitux. At some time, Irinotecan can be added if needed.   All questions were answered. The patient knows to call the clinic with any problems, questions or concerns. We can certainly see the patient much sooner if necessary.  Patient and plan discussed with Dr. Farrel Gobble and he is in agreement with the aforementioned.   KEFALAS,THOMAS 04/07/2014

## 2014-04-07 ENCOUNTER — Encounter (HOSPITAL_COMMUNITY): Payer: Self-pay | Admitting: Oncology

## 2014-04-07 ENCOUNTER — Other Ambulatory Visit (HOSPITAL_COMMUNITY): Payer: Managed Care, Other (non HMO)

## 2014-04-07 ENCOUNTER — Encounter (HOSPITAL_BASED_OUTPATIENT_CLINIC_OR_DEPARTMENT_OTHER): Payer: Managed Care, Other (non HMO) | Admitting: Oncology

## 2014-04-07 ENCOUNTER — Encounter (HOSPITAL_BASED_OUTPATIENT_CLINIC_OR_DEPARTMENT_OTHER): Payer: Managed Care, Other (non HMO)

## 2014-04-07 VITALS — BP 128/69 | HR 66 | Temp 97.5°F | Resp 18 | Wt 216.4 lb

## 2014-04-07 DIAGNOSIS — D500 Iron deficiency anemia secondary to blood loss (chronic): Secondary | ICD-10-CM

## 2014-04-07 DIAGNOSIS — D61818 Other pancytopenia: Secondary | ICD-10-CM

## 2014-04-07 DIAGNOSIS — L708 Other acne: Secondary | ICD-10-CM

## 2014-04-07 DIAGNOSIS — C20 Malignant neoplasm of rectum: Secondary | ICD-10-CM

## 2014-04-07 DIAGNOSIS — C787 Secondary malignant neoplasm of liver and intrahepatic bile duct: Secondary | ICD-10-CM

## 2014-04-07 DIAGNOSIS — R161 Splenomegaly, not elsewhere classified: Secondary | ICD-10-CM

## 2014-04-07 DIAGNOSIS — E538 Deficiency of other specified B group vitamins: Secondary | ICD-10-CM | POA: Diagnosis not present

## 2014-04-07 DIAGNOSIS — K909 Intestinal malabsorption, unspecified: Secondary | ICD-10-CM

## 2014-04-07 DIAGNOSIS — D510 Vitamin B12 deficiency anemia due to intrinsic factor deficiency: Secondary | ICD-10-CM

## 2014-04-07 DIAGNOSIS — C7801 Secondary malignant neoplasm of right lung: Secondary | ICD-10-CM

## 2014-04-07 DIAGNOSIS — K7460 Unspecified cirrhosis of liver: Secondary | ICD-10-CM

## 2014-04-07 DIAGNOSIS — Z5112 Encounter for antineoplastic immunotherapy: Secondary | ICD-10-CM

## 2014-04-07 DIAGNOSIS — L270 Generalized skin eruption due to drugs and medicaments taken internally: Secondary | ICD-10-CM

## 2014-04-07 LAB — CBC WITH DIFFERENTIAL/PLATELET
BASOS PCT: 0 % (ref 0–1)
Basophils Absolute: 0 10*3/uL (ref 0.0–0.1)
EOS ABS: 0.1 10*3/uL (ref 0.0–0.7)
EOS PCT: 3 % (ref 0–5)
HEMATOCRIT: 27.9 % — AB (ref 39.0–52.0)
Hemoglobin: 9.3 g/dL — ABNORMAL LOW (ref 13.0–17.0)
Lymphocytes Relative: 8 % — ABNORMAL LOW (ref 12–46)
Lymphs Abs: 0.4 10*3/uL — ABNORMAL LOW (ref 0.7–4.0)
MCH: 32.6 pg (ref 26.0–34.0)
MCHC: 33.3 g/dL (ref 30.0–36.0)
MCV: 97.9 fL (ref 78.0–100.0)
MONO ABS: 0.3 10*3/uL (ref 0.1–1.0)
MONOS PCT: 8 % (ref 3–12)
NEUTROS ABS: 3.3 10*3/uL (ref 1.7–7.7)
Neutrophils Relative %: 80 % — ABNORMAL HIGH (ref 43–77)
Platelets: 40 10*3/uL — ABNORMAL LOW (ref 150–400)
RBC: 2.85 MIL/uL — ABNORMAL LOW (ref 4.22–5.81)
RDW: 15 % (ref 11.5–15.5)
Smear Review: DECREASED
WBC: 4.2 10*3/uL (ref 4.0–10.5)

## 2014-04-07 LAB — IRON AND TIBC
Iron: 77 ug/dL (ref 42–135)
Saturation Ratios: 25 % (ref 20–55)
TIBC: 314 ug/dL (ref 215–435)
UIBC: 237 ug/dL (ref 125–400)

## 2014-04-07 LAB — COMPREHENSIVE METABOLIC PANEL
ALK PHOS: 101 U/L (ref 39–117)
ALT: 29 U/L (ref 0–53)
AST: 35 U/L (ref 0–37)
Albumin: 2.9 g/dL — ABNORMAL LOW (ref 3.5–5.2)
Anion gap: 8 (ref 5–15)
BUN: 17 mg/dL (ref 6–23)
CALCIUM: 9 mg/dL (ref 8.4–10.5)
CO2: 20 meq/L (ref 19–32)
Chloride: 111 mEq/L (ref 96–112)
Creatinine, Ser: 1.67 mg/dL — ABNORMAL HIGH (ref 0.50–1.35)
GFR calc Af Amer: 49 mL/min — ABNORMAL LOW (ref >90)
GFR, EST NON AFRICAN AMERICAN: 43 mL/min — AB (ref >90)
Glucose, Bld: 79 mg/dL (ref 70–99)
POTASSIUM: 4 meq/L (ref 3.7–5.3)
SODIUM: 139 meq/L (ref 137–147)
Total Bilirubin: 1.2 mg/dL (ref 0.3–1.2)
Total Protein: 6.7 g/dL (ref 6.0–8.3)

## 2014-04-07 LAB — FERRITIN: FERRITIN: 58 ng/mL (ref 22–322)

## 2014-04-07 LAB — CEA: CEA: 6.6 ng/mL — AB (ref 0.0–5.0)

## 2014-04-07 LAB — MAGNESIUM: Magnesium: 2.1 mg/dL (ref 1.5–2.5)

## 2014-04-07 MED ORDER — DIPHENHYDRAMINE HCL 50 MG/ML IJ SOLN
50.0000 mg | Freq: Once | INTRAMUSCULAR | Status: AC
  Administered 2014-04-07: 50 mg via INTRAVENOUS

## 2014-04-07 MED ORDER — CETUXIMAB CHEMO IV INJECTION 200 MG/100ML
250.0000 mg/m2 | Freq: Once | INTRAVENOUS | Status: AC
  Administered 2014-04-07: 500 mg via INTRAVENOUS
  Filled 2014-04-07: qty 250

## 2014-04-07 MED ORDER — HEPARIN SOD (PORK) LOCK FLUSH 100 UNIT/ML IV SOLN
500.0000 [IU] | Freq: Once | INTRAVENOUS | Status: AC | PRN
  Administered 2014-04-07: 500 [IU]
  Filled 2014-04-07: qty 5

## 2014-04-07 MED ORDER — DIPHENHYDRAMINE HCL 50 MG/ML IJ SOLN
INTRAMUSCULAR | Status: AC
  Filled 2014-04-07: qty 1

## 2014-04-07 MED ORDER — CLINDAMYCIN PHOSPHATE 1 % EX LOTN: TOPICAL_LOTION | Freq: Two times a day (BID) | CUTANEOUS | Status: DC

## 2014-04-07 MED ORDER — SODIUM CHLORIDE 0.9 % IV SOLN
Freq: Once | INTRAVENOUS | Status: AC
  Administered 2014-04-07: 10:00:00 via INTRAVENOUS

## 2014-04-07 MED ORDER — SODIUM CHLORIDE 0.9 % IJ SOLN: 10.0000 mL | INTRAMUSCULAR | Status: DC | PRN

## 2014-04-07 NOTE — Progress Notes (Signed)
Ivin R Seiter Tolerated chemotherapy well today.  Discharged ambulatory

## 2014-04-07 NOTE — Patient Instructions (Signed)
[facility] Discharge Instructions for Patients Receiving Chemotherapy  Today you received the following chemotherapy agents erbitux You saw [name] today, Pre-chemo labs as planned, Chemotherapy as planned, Rx for Cleocin for acne rash, Return in 2 weeks for follow-up Please call the clinic if you have any questions or concerns To help prevent nausea and vomiting after your treatment, we encourage you to take your nausea medication     If you develop nausea and vomiting that is not controlled by your nausea medication, call the clinic. If it is after clinic hours your family physician or the after hours number for the clinic or go to the Emergency Department.   BELOW ARE SYMPTOMS THAT SHOULD BE REPORTED IMMEDIATELY:  *FEVER GREATER THAN 101.0 F  *CHILLS WITH OR WITHOUT FEVER  NAUSEA AND VOMITING THAT IS NOT CONTROLLED WITH YOUR NAUSEA MEDICATION  *UNUSUAL SHORTNESS OF BREATH  *UNUSUAL BRUISING OR BLEEDING  TENDERNESS IN MOUTH AND THROAT WITH OR WITHOUT PRESENCE OF ULCERS  *URINARY PROBLEMS  *BOWEL PROBLEMS  UNUSUAL RASH Items with * indicate a potential emergency and should be followed up as soon as possible.  One of the nurses will contact you 24 hours after your treatment. Please let the nurse know about any problems that you may have experienced. Feel free to call the clinic you have any questions or concerns. The clinic phone number is [phone].   I have been informed and understand all the instructions given to me. I know to contact the clinic, my physician, or go to the Emergency Department if any problems should occur. I do not have any questions at this time, but understand that I may call the clinic during office hours or the Patient Navigator at [phone] should I have any questions or need assistance in obtaining follow up care.    Cetuximab injection What is this medicine? CETUXIMAB (se TUX i mab) is a chemotherapy drug. It targets a  specific protein within cancer cells and stops the cells from growing. It is used to treat colorectal cancer and head and neck cancer. This medicine may be used for other purposes; ask your health care provider or pharmacist if you have questions. COMMON BRAND NAME(S): Erbitux What should I tell my health care provider before I take this medicine? They need to know if you have any of these conditions: -heart disease -history of irregular heartbeat -history of low levels of calcium, magnesium, or potassium in the blood -lung or breathing disease, like asthma -an unusual or allergic reaction to cetuximab, other medicines, foods, dyes, or preservatives -pregnant or trying to get pregnant -breast-feeding How should I use this medicine? This drug is given as an infusion into a vein. It is administered in a hospital or clinic by a specially trained health care professional. Talk to your pediatrician regarding the use of this medicine in children. Special care may be needed. Overdosage: If you think you have taken too much of this medicine contact a poison control center or emergency room at once. NOTE: This medicine is only for you. Do not share this medicine with others. What if I miss a dose? It is important not to miss your dose. Call your doctor or health care professional if you are unable to keep an appointment. What may interact with this medicine? Interactions are not expected. This list may not describe all possible interactions. Give your health care provider a list of all the medicines, herbs, non-prescription drugs, or dietary supplements you use.  Also tell them if you smoke, drink alcohol, or use illegal drugs. Some items may interact with your medicine. What should I watch for while using this medicine? Visit your doctor or health care professional for regular checks on your progress. This drug may make you feel generally unwell. This is not uncommon, as chemotherapy can affect healthy  cells as well as cancer cells. Report any side effects. Continue your course of treatment even though you feel ill unless your doctor tells you to stop. This medicine can make you more sensitive to the sun. Keep out of the sun while taking this medicine and for 2 months after the last dose. If you cannot avoid being in the sun, wear protective clothing and use sunscreen. Do not use sun lamps or tanning beds/booths. You may need blood work done while you are taking this medicine. In some cases, you may be given additional medicines to help with side effects. Follow all directions for their use. Call your doctor or health care professional for advice if you get a fever, chills or sore throat, or other symptoms of a cold or flu. Do not treat yourself. This drug decreases your body's ability to fight infections. Try to avoid being around people who are sick. Avoid taking products that contain aspirin, acetaminophen, ibuprofen, naproxen, or ketoprofen unless instructed by your doctor. These medicines may hide a fever. Do not become pregnant while taking this medicine. Women should inform their doctor if they wish to become pregnant or think they might be pregnant. There is a potential for serious side effects to an unborn child. Use adequate birth control methods. Avoid pregnancy for at least 6 months after your last dose. Talk to your health care professional or pharmacist for more information. Do not breast-feed an infant while taking this medicine or during the 2 months after your last dose. What side effects may I notice from receiving this medicine? Side effects that you should report to your doctor or health care professional as soon as possible: -allergic reactions like skin rash, itching or hives, swelling of the face, lips, or tongue -breathing problems -changes in vision -fast, irregular heartbeat -feeling faint or lightheaded, falls -fever, chills -mouth sores -redness, blistering, peeling or  loosening of the skin, including inside the mouth -trouble passing urine or change in the amount of urine -unusually weak or tired Side effects that usually do not require medical attention (report to your doctor or health care professional if they continue or are bothersome): -changes in skin like acne, cracks, skin dryness -constipation -diarrhea -headache -nail changes -nausea, vomiting -stomach upset -weight loss This list may not describe all possible side effects. Call your doctor for medical advice about side effects. You may report side effects to FDA at 1-800-FDA-1088. Where should I keep my medicine? This drug is given in a hospital or clinic and will not be stored at home. NOTE: This sheet is a summary. It may not cover all possible information. If you have questions about this medicine, talk to your doctor, pharmacist, or health care provider.  2015, [employer]vier/[employer] Standard. (2013-08-28 16:14:34)

## 2014-04-07 NOTE — Patient Instructions (Signed)
[facility] Discharge Instructions  RECOMMENDATIONS MADE BY THE CONSULTANT AND ANY TEST RESULTS WILL BE SENT TO YOUR REFERRING PHYSICIAN.  Rx for Cleocin T lotion for acne rash sent to [facility].  Please apply twice daily to facial and chest rash. Return in 2 weeks for next chemotherapy infusion   Thank you for choosing [facility] to provide your oncology and hematology care.  To afford each patient quality time with our providers, please arrive at least 15 minutes before your scheduled appointment time.  With your help, our goal is to use those 15 minutes to complete the necessary work-up to ensure our physicians have the information they need to help with your evaluation and healthcare recommendations.    Effective January 1st, 2014, we ask that you re-schedule your appointment with our physicians should you arrive 10 or more minutes late for your appointment.  We strive to give you quality time with our providers, and arriving late affects you and other patients whose appointments are after yours.    Again, thank you for choosing [facility].  Our hope is that these requests will decrease the amount of time that you wait before being seen by our physicians.       _____________________________________________________________  Should you have questions after your visit to [facility], please contact our office at [phone] between the hours of 8:30 a.m. and 5:00 p.m.  Voicemails left after 4:30 p.m. will not be returned until the following business day.  For prescription refill requests, have your pharmacy contact our office with your prescription refill request.

## 2014-04-08 ENCOUNTER — Other Ambulatory Visit (HOSPITAL_COMMUNITY): Payer: Self-pay | Admitting: Oncology

## 2014-04-08 DIAGNOSIS — D500 Iron deficiency anemia secondary to blood loss (chronic): Secondary | ICD-10-CM

## 2014-04-14 ENCOUNTER — Other Ambulatory Visit (HOSPITAL_COMMUNITY): Payer: Self-pay | Admitting: Oncology

## 2014-04-14 ENCOUNTER — Encounter (HOSPITAL_COMMUNITY): Payer: Managed Care, Other (non HMO)

## 2014-04-14 ENCOUNTER — Ambulatory Visit (HOSPITAL_COMMUNITY): Payer: Managed Care, Other (non HMO)

## 2014-04-14 ENCOUNTER — Encounter (HOSPITAL_BASED_OUTPATIENT_CLINIC_OR_DEPARTMENT_OTHER): Payer: Managed Care, Other (non HMO) | Admitting: Oncology

## 2014-04-14 ENCOUNTER — Encounter (HOSPITAL_BASED_OUTPATIENT_CLINIC_OR_DEPARTMENT_OTHER): Payer: Managed Care, Other (non HMO)

## 2014-04-14 ENCOUNTER — Encounter (HOSPITAL_COMMUNITY): Payer: Self-pay

## 2014-04-14 VITALS — BP 132/61 | HR 80 | Temp 98.3°F | Resp 16 | Wt 214.6 lb

## 2014-04-14 DIAGNOSIS — D538 Other specified nutritional anemias: Secondary | ICD-10-CM

## 2014-04-14 DIAGNOSIS — D509 Iron deficiency anemia, unspecified: Secondary | ICD-10-CM

## 2014-04-14 DIAGNOSIS — C20 Malignant neoplasm of rectum: Secondary | ICD-10-CM

## 2014-04-14 DIAGNOSIS — E538 Deficiency of other specified B group vitamins: Secondary | ICD-10-CM | POA: Diagnosis not present

## 2014-04-14 DIAGNOSIS — M25511 Pain in right shoulder: Secondary | ICD-10-CM

## 2014-04-14 DIAGNOSIS — L989 Disorder of the skin and subcutaneous tissue, unspecified: Secondary | ICD-10-CM

## 2014-04-14 DIAGNOSIS — C7801 Secondary malignant neoplasm of right lung: Secondary | ICD-10-CM

## 2014-04-14 DIAGNOSIS — L723 Sebaceous cyst: Secondary | ICD-10-CM

## 2014-04-14 DIAGNOSIS — D62 Acute posthemorrhagic anemia: Secondary | ICD-10-CM

## 2014-04-14 DIAGNOSIS — D500 Iron deficiency anemia secondary to blood loss (chronic): Secondary | ICD-10-CM

## 2014-04-14 DIAGNOSIS — N139 Obstructive and reflux uropathy, unspecified: Secondary | ICD-10-CM

## 2014-04-14 DIAGNOSIS — K625 Hemorrhage of anus and rectum: Secondary | ICD-10-CM

## 2014-04-14 DIAGNOSIS — Z5111 Encounter for antineoplastic chemotherapy: Secondary | ICD-10-CM

## 2014-04-14 DIAGNOSIS — J069 Acute upper respiratory infection, unspecified: Secondary | ICD-10-CM

## 2014-04-14 LAB — COMPREHENSIVE METABOLIC PANEL
ALBUMIN: 2.8 g/dL — AB (ref 3.5–5.2)
ALT: 24 U/L (ref 0–53)
AST: 33 U/L (ref 0–37)
Alkaline Phosphatase: 115 U/L (ref 39–117)
Anion gap: 17 — ABNORMAL HIGH (ref 5–15)
BUN: 47 mg/dL — ABNORMAL HIGH (ref 6–23)
CALCIUM: 8.5 mg/dL (ref 8.4–10.5)
CHLORIDE: 101 meq/L (ref 96–112)
CO2: 14 mEq/L — ABNORMAL LOW (ref 19–32)
Creatinine, Ser: 7.1 mg/dL — ABNORMAL HIGH (ref 0.50–1.35)
GFR calc Af Amer: 9 mL/min — ABNORMAL LOW (ref >90)
GFR calc non Af Amer: 7 mL/min — ABNORMAL LOW (ref >90)
Glucose, Bld: 139 mg/dL — ABNORMAL HIGH (ref 70–99)
Potassium: 4.6 mEq/L (ref 3.7–5.3)
SODIUM: 132 meq/L — AB (ref 137–147)
Total Bilirubin: 1 mg/dL (ref 0.3–1.2)
Total Protein: 6.3 g/dL (ref 6.0–8.3)

## 2014-04-14 LAB — CBC WITH DIFFERENTIAL/PLATELET
BASOS ABS: 0 10*3/uL (ref 0.0–0.1)
BASOS PCT: 0 % (ref 0–1)
EOS ABS: 0 10*3/uL (ref 0.0–0.7)
EOS PCT: 1 % (ref 0–5)
HCT: 18.5 % — ABNORMAL LOW (ref 39.0–52.0)
Hemoglobin: 6.5 g/dL — CL (ref 13.0–17.0)
LYMPHS PCT: 7 % — AB (ref 12–46)
Lymphs Abs: 0.4 10*3/uL — ABNORMAL LOW (ref 0.7–4.0)
MCH: 32.7 pg (ref 26.0–34.0)
MCHC: 35.1 g/dL (ref 30.0–36.0)
MCV: 93 fL (ref 78.0–100.0)
MONO ABS: 0.7 10*3/uL (ref 0.1–1.0)
Monocytes Relative: 11 % (ref 3–12)
Neutro Abs: 5.2 10*3/uL (ref 1.7–7.7)
Neutrophils Relative %: 82 % — ABNORMAL HIGH (ref 43–77)
PLATELETS: 84 10*3/uL — AB (ref 150–400)
RBC: 1.99 MIL/uL — ABNORMAL LOW (ref 4.22–5.81)
RDW: 14.7 % (ref 11.5–15.5)
WBC: 6.3 10*3/uL (ref 4.0–10.5)

## 2014-04-14 LAB — CEA: CEA: 4.6 ng/mL (ref 0.0–5.0)

## 2014-04-14 LAB — PREPARE RBC (CROSSMATCH)

## 2014-04-14 LAB — MAGNESIUM: Magnesium: 2.2 mg/dL (ref 1.5–2.5)

## 2014-04-14 MED ORDER — HEPARIN SOD (PORK) LOCK FLUSH 100 UNIT/ML IV SOLN
INTRAVENOUS | Status: AC
  Filled 2014-04-14: qty 5

## 2014-04-14 MED ORDER — CETUXIMAB CHEMO IV INJECTION 200 MG/100ML
250.0000 mg/m2 | Freq: Once | INTRAVENOUS | Status: AC
  Administered 2014-04-14: 500 mg via INTRAVENOUS
  Filled 2014-04-14: qty 250

## 2014-04-14 MED ORDER — CYANOCOBALAMIN 1000 MCG/ML IJ SOLN
1000.0000 ug | Freq: Once | INTRAMUSCULAR | Status: AC
  Administered 2014-04-14: 1000 ug via INTRAMUSCULAR

## 2014-04-14 MED ORDER — SODIUM CHLORIDE 0.9 % IV SOLN
510.0000 mg | Freq: Once | INTRAVENOUS | Status: AC
  Administered 2014-04-14: 510 mg via INTRAVENOUS
  Filled 2014-04-14: qty 17

## 2014-04-14 MED ORDER — DIPHENHYDRAMINE HCL 50 MG/ML IJ SOLN
50.0000 mg | Freq: Once | INTRAMUSCULAR | Status: AC
Start: 2014-04-14 — End: 2014-04-14
  Administered 2014-04-14: 50 mg via INTRAVENOUS

## 2014-04-14 MED ORDER — HYDROCODONE-ACETAMINOPHEN 5-325 MG PO TABS: 1.0000 | ORAL_TABLET | Freq: Four times a day (QID) | ORAL | Status: DC | PRN

## 2014-04-14 MED ORDER — SODIUM CHLORIDE 0.9 % IJ SOLN
10.0000 mL | INTRAMUSCULAR | Status: DC | PRN
  Administered 2014-04-14: 10 mL
  Filled 2014-04-14: qty 10

## 2014-04-14 MED ORDER — SODIUM CHLORIDE 0.9 % IV SOLN
Freq: Once | INTRAVENOUS | Status: AC
  Administered 2014-04-14: 10:00:00 via INTRAVENOUS

## 2014-04-14 MED ORDER — ACETAMINOPHEN 325 MG PO TABS: 650.0000 mg | ORAL_TABLET | Freq: Once | ORAL | Status: DC

## 2014-04-14 MED ORDER — CYANOCOBALAMIN 1000 MCG/ML IJ SOLN
INTRAMUSCULAR | Status: AC
  Filled 2014-04-14: qty 1

## 2014-04-14 MED ORDER — HEPARIN SOD (PORK) LOCK FLUSH 100 UNIT/ML IV SOLN
500.0000 [IU] | Freq: Once | INTRAVENOUS | Status: AC | PRN
  Administered 2014-04-14: 500 [IU]

## 2014-04-14 MED ORDER — DIPHENHYDRAMINE HCL 50 MG/ML IJ SOLN
INTRAMUSCULAR | Status: AC
  Filled 2014-04-14: qty 1

## 2014-04-14 NOTE — Progress Notes (Signed)
Please refer to the chemo encounter for more information

## 2014-04-14 NOTE — Addendum Note (Signed)
Addended by: Baird Cancer on: 04/14/2014 11:30 AM   Modules accepted: Orders

## 2014-04-14 NOTE — Progress Notes (Signed)
CRITICAL VALUE ALERT Critical value received:  hemoglobin Date of notification:  04/14/2014 Time of notification: 0952 Critical value read back:  Yes.   Nurse who received alert:  Madaline Brilliant MD notified (1st page):  Dr Barnet Glasgow at [facility] iron infusion and chemotherapy well today.  Also received  A B12 injection per MD orders. Will return tomorrow for 2 units of PRBCs.

## 2014-04-14 NOTE — Addendum Note (Signed)
Addended by: Baird Cancer on: 04/14/2014 10:39 AM   Modules accepted: Level of Service

## 2014-04-14 NOTE — Patient Instructions (Signed)
[facility] Discharge Instructions for Patients Receiving Chemotherapy  Today you received the following chemotherapy agents erbitux You are going to get tranfused 2 unit of PRBC tomorrow.  Your hemoglobin was 6.5.  You also received an iron transfusion today and a B12 injection. Please call the clinic if you have any questions or concerns.  To help prevent nausea and vomiting after your treatment, we encourage you to take your nausea medication    If you develop nausea and vomiting that is not controlled by your nausea medication, call the clinic. If it is after clinic hours your family physician or the after hours number for the clinic or go to the Emergency Department.   BELOW ARE SYMPTOMS THAT SHOULD BE REPORTED IMMEDIATELY:  *FEVER GREATER THAN 101.0 F  *CHILLS WITH OR WITHOUT FEVER  NAUSEA AND VOMITING THAT IS NOT CONTROLLED WITH YOUR NAUSEA MEDICATION  *UNUSUAL SHORTNESS OF BREATH  *UNUSUAL BRUISING OR BLEEDING  TENDERNESS IN MOUTH AND THROAT WITH OR WITHOUT PRESENCE OF ULCERS  *URINARY PROBLEMS  *BOWEL PROBLEMS  UNUSUAL RASH Items with * indicate a potential emergency and should be followed up as soon as possible.  One of the nurses will contact you 24 hours after your treatment. Please let the nurse know about any problems that you may have experienced. Feel free to call the clinic you have any questions or concerns. The clinic phone number is [phone].   I have been informed and understand all the instructions given to me. I know to contact the clinic, my physician, or go to the Emergency Department if any problems should occur. I do not have any questions at this time, but understand that I may call the clinic during office hours or the Patient Navigator at [phone] should I have any questions or need assistance in obtaining follow up care.    Cetuximab injection What is this medicine? CETUXIMAB (se TUX i mab) is a chemotherapy drug. It  targets a specific protein within cancer cells and stops the cells from growing. It is used to treat colorectal cancer and head and neck cancer. This medicine may be used for other purposes; ask your health care provider or pharmacist if you have questions. COMMON BRAND NAME(S): Erbitux What should I tell my health care provider before I take this medicine? They need to know if you have any of these conditions: -heart disease -history of irregular heartbeat -history of low levels of calcium, magnesium, or potassium in the blood -lung or breathing disease, like asthma -an unusual or allergic reaction to cetuximab, other medicines, foods, dyes, or preservatives -pregnant or trying to get pregnant -breast-feeding How should I use this medicine? This drug is given as an infusion into a vein. It is administered in a hospital or clinic by a specially trained health care professional. Talk to your pediatrician regarding the use of this medicine in children. Special care may be needed. Overdosage: If you think you have taken too much of this medicine contact a poison control center or emergency room at once. NOTE: This medicine is only for you. Do not share this medicine with others. What if I miss a dose? It is important not to miss your dose. Call your doctor or health care professional if you are unable to keep an appointment. What may interact with this medicine? Interactions are not expected. This list may not describe all possible interactions. Give your health care provider a list of all the medicines, herbs, non-prescription drugs, or  dietary supplements you use. Also tell them if you smoke, drink alcohol, or use illegal drugs. Some items may interact with your medicine. What should I watch for while using this medicine? Visit your doctor or health care professional for regular checks on your progress. This drug may make you feel generally unwell. This is not uncommon, as chemotherapy can  affect healthy cells as well as cancer cells. Report any side effects. Continue your course of treatment even though you feel ill unless your doctor tells you to stop. This medicine can make you more sensitive to the sun. Keep out of the sun while taking this medicine and for 2 months after the last dose. If you cannot avoid being in the sun, wear protective clothing and use sunscreen. Do not use sun lamps or tanning beds/booths. You may need blood work done while you are taking this medicine. In some cases, you may be given additional medicines to help with side effects. Follow all directions for their use. Call your doctor or health care professional for advice if you get a fever, chills or sore throat, or other symptoms of a cold or flu. Do not treat yourself. This drug decreases your body's ability to fight infections. Try to avoid being around people who are sick. Avoid taking products that contain aspirin, acetaminophen, ibuprofen, naproxen, or ketoprofen unless instructed by your doctor. These medicines may hide a fever. Do not become pregnant while taking this medicine. Women should inform their doctor if they wish to become pregnant or think they might be pregnant. There is a potential for serious side effects to an unborn child. Use adequate birth control methods. Avoid pregnancy for at least 6 months after your last dose. Talk to your health care professional or pharmacist for more information. Do not breast-feed an infant while taking this medicine or during the 2 months after your last dose. What side effects may I notice from receiving this medicine? Side effects that you should report to your doctor or health care professional as soon as possible: -allergic reactions like skin rash, itching or hives, swelling of the face, lips, or tongue -breathing problems -changes in vision -fast, irregular heartbeat -feeling faint or lightheaded, falls -fever, chills -mouth sores -redness,  blistering, peeling or loosening of the skin, including inside the mouth -trouble passing urine or change in the amount of urine -unusually weak or tired Side effects that usually do not require medical attention (report to your doctor or health care professional if they continue or are bothersome): -changes in skin like acne, cracks, skin dryness -constipation -diarrhea -headache -nail changes -nausea, vomiting -stomach upset -weight loss This list may not describe all possible side effects. Call your doctor for medical advice about side effects. You may report side effects to FDA at 1-800-FDA-1088. Where should I keep my medicine? This drug is given in a hospital or clinic and will not be stored at home. NOTE: This sheet is a summary. It may not cover all possible information. If you have questions about this medicine, talk to your doctor, pharmacist, or health care provider.  2015, [employer] Standard. (2013-08-28 16:14:34)

## 2014-04-14 NOTE — Progress Notes (Addendum)
[name] is seen as a work-in today.  He reports a lesion on his right, posterior aspect of head that started bleeding yesterday.  [name] notes a area on the back of his head that was sore and one time when he touched it, he noticed blood on his his hand.  In the clinic, he is in a chemo bed starting chemotherapy as planned.  He has a blood soaked gauze on the left aspect of occipital area of cranium.  On exam, blood is noted to be seeping from a lesion measure about 1.0- 1.5 cm in size.  It is not tender on palpation but does excrete more blood on palpation.  I will refer him to Dr. Arnoldo Morale for evaluation of this and removal of cyst if indicated.  I personally reviewed and went over laboratory results with the patient.  The results are noted within this dictation.  His Hgb is noted to be 6.5 g/dL.  He is scheduled for IV Feraheme today which he will receive.  If we are able, we will transfuse him 2 units of PRBCs today or tomorrow.  [name] is agreeable to this plan of action.  He does not hematuria from recent renal calculus passing, rectal bleeding that is mild, bleeding from left occipital lesion, and iron deficiency are all contributing factor to acute anemia.  CBC    Component Value Date/Time   WBC 6.3 04/14/2014 0900   RBC 1.99* 04/14/2014 0900   HGB 6.5* 04/14/2014 0900   HCT 18.5* 04/14/2014 0900   PLT 84* 04/14/2014 0900   MCV 93.0 04/14/2014 0900   MCH 32.7 04/14/2014 0900   MCHC 35.1 04/14/2014 0900   RDW 14.7 04/14/2014 0900   LYMPHSABS 0.4* 04/14/2014 0900   MONOABS 0.7 04/14/2014 0900   EOSABS 0.0 04/14/2014 0900   BASOSABS 0.0 04/14/2014 0900     Patient requests a refill on pain medication and this was completed today.  He will return as planned and he will undergo treatment today as planned.   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    Chemistry      Component Value Date/Time   NA 132* 04/14/2014 0900   K 4.6  04/14/2014 0900   CL 101 04/14/2014 0900   CO2 14* 04/14/2014 0900   BUN 47* 04/14/2014 0900   CREATININE 7.10* 04/14/2014 0900      Component Value Date/Time   CALCIUM 8.5 04/14/2014 0900   ALKPHOS 115 04/14/2014 0900   AST 33 04/14/2014 0900   ALT 24 04/14/2014 0900   BILITOT 1.0 04/14/2014 0900     He is asymptomatic and does not appear acutely ill.  Therefore, we will re-check labs in 1 week when he returns for therapy.   Patient and plan discussed with Dr. Farrel Gobble and he is in agreement with the aforementioned. Patient's right occipital nodule also evaluate by Dr. Scheryl Darter 04/14/2014

## 2014-04-15 ENCOUNTER — Encounter (HOSPITAL_BASED_OUTPATIENT_CLINIC_OR_DEPARTMENT_OTHER): Payer: Managed Care, Other (non HMO)

## 2014-04-15 ENCOUNTER — Ambulatory Visit (HOSPITAL_COMMUNITY): Payer: Managed Care, Other (non HMO)

## 2014-04-15 ENCOUNTER — Ambulatory Visit (HOSPITAL_COMMUNITY): Payer: Managed Care, Other (non HMO) | Admitting: Oncology

## 2014-04-15 ENCOUNTER — Encounter (HOSPITAL_COMMUNITY): Payer: Self-pay

## 2014-04-15 VITALS — BP 103/76 | HR 60 | Temp 97.7°F | Resp 18

## 2014-04-15 DIAGNOSIS — D62 Acute posthemorrhagic anemia: Secondary | ICD-10-CM

## 2014-04-15 DIAGNOSIS — T451X5A Adverse effect of antineoplastic and immunosuppressive drugs, initial encounter: Secondary | ICD-10-CM

## 2014-04-15 DIAGNOSIS — D6481 Anemia due to antineoplastic chemotherapy: Secondary | ICD-10-CM

## 2014-04-15 DIAGNOSIS — D500 Iron deficiency anemia secondary to blood loss (chronic): Secondary | ICD-10-CM

## 2014-04-15 DIAGNOSIS — E538 Deficiency of other specified B group vitamins: Secondary | ICD-10-CM | POA: Diagnosis not present

## 2014-04-15 MED ORDER — DIPHENHYDRAMINE HCL 25 MG PO CAPS
ORAL_CAPSULE | ORAL | Status: AC
  Filled 2014-04-15: qty 1

## 2014-04-15 MED ORDER — DIPHENHYDRAMINE HCL 25 MG PO CAPS
25.0000 mg | ORAL_CAPSULE | Freq: Once | ORAL | Status: AC
  Administered 2014-04-15: 25 mg via ORAL

## 2014-04-15 MED ORDER — HEPARIN SOD (PORK) LOCK FLUSH 100 UNIT/ML IV SOLN
500.0000 [IU] | Freq: Every day | INTRAVENOUS | Status: AC | PRN
  Administered 2014-04-15: 500 [IU]
  Filled 2014-04-15: qty 5

## 2014-04-15 MED ORDER — SODIUM CHLORIDE 0.9 % IJ SOLN: 10.0000 mL | INTRAMUSCULAR | Status: DC | PRN

## 2014-04-15 MED ORDER — ACETAMINOPHEN 325 MG PO TABS
650.0000 mg | ORAL_TABLET | Freq: Once | ORAL | Status: AC
  Administered 2014-04-15: 650 mg via ORAL

## 2014-04-15 MED ORDER — SODIUM CHLORIDE 0.9 % IV SOLN
250.0000 mL | Freq: Once | INTRAVENOUS | Status: AC
Start: 2014-04-15 — End: 2014-04-15
  Administered 2014-04-15: 250 mL via INTRAVENOUS

## 2014-04-15 MED ORDER — ACETAMINOPHEN 325 MG PO TABS
ORAL_TABLET | ORAL | Status: AC
  Filled 2014-04-15: qty 2

## 2014-04-15 NOTE — Patient Instructions (Addendum)
[place] Discharge Instructions  RECOMMENDATIONS MADE BY THE CONSULTANT AND ANY TEST RESULTS WILL BE SENT TO YOUR REFERRING PHYSICIAN.  Today you were transfused 2 units of packed red blood cells. Return as scheduled next week for chemo treatment and office visit. Contact the clinic with any questions or concerns.  Thank you for choosing [facility] to provide your oncology and hematology care.  To afford each patient quality time with our providers, please arrive at least 15 minutes before your scheduled appointment time.  With your help, our goal is to use those 15 minutes to complete the necessary work-up to ensure our physicians have the information they need to help with your evaluation and healthcare recommendations.    Effective January 1st, 2014, we ask that you re-schedule your appointment with our physicians should you arrive 10 or more minutes late for your appointment.  We strive to give you quality time with our providers, and arriving late affects you and other patients whose appointments are after yours.    Again, thank you for choosing [facility].  Our hope is that these requests will decrease the amount of time that you wait before being seen by our physicians.       _____________________________________________________________  Should you have questions after your visit to [facility], please contact our office at [phone] between the hours of 8:30 a.m. and 4:30 p.m.  Voicemails left after 4:30 p.m. will not be returned until the following business day.  For prescription refill requests, have your pharmacy contact our office with your prescription refill request.    _______________________________________________________________  We hope that we have given you very good care.  You may receive a patient satisfaction survey in the mail, please complete it and return it as soon as possible.  We value your  feedback!  _______________________________________________________________  Have you asked about our STAR program?  STAR stands for Survivorship Training and Rehabilitation, and this is a nationally recognized cancer care program that focuses on survivorship and rehabilitation.  Cancer and cancer treatments may cause problems, such as, pain, making you feel tired and keeping you from doing the things that you need or want to do. Cancer rehabilitation can help. Our goal is to reduce these troubling effects and help you have the best quality of life possible.  You may receive a survey from a nurse that asks questions about your current state of health.  Based on the survey results, all eligible patients will be referred to the [facility] program for an evaluation so we can better serve you!  A frequently asked questions sheet is available upon request.

## 2014-04-16 LAB — TYPE AND SCREEN
ABO/RH(D): A POS
Antibody Screen: NEGATIVE
UNIT DIVISION: 0
Unit division: 0

## 2014-04-19 NOTE — Progress Notes (Addendum)
[name] Kilts, MD [name] [id number]  Adenocarcinoma of rectum  Secondary malignancy of right lung  Leg edema, left - Plan: US Venous Img Lower Unilateral Left  Left leg pain - Plan: US Venous Img Lower Unilateral Left  Hematuria  Acute blood loss anemia - Plan: Care order/instruction, 0.9 %  sodium chloride infusion, sodium chloride 0.9 % injection 10 mL, heparin lock flush 100 unit/mL, heparin lock flush 100 unit/mL, sodium chloride 0.9 % injection 3 mL, Practitioner attestation of consent, Complete patient signature process for consent form, Type and screen, Transfuse RBC, acetaminophen (TYLENOL) tablet 650 mg, diphenhydrAMINE (BENADRYL) capsule 25 mg, CANCELED: Prepare RBC  Elevated serum creatinine - Plan: US Renal  CURRENT THERAPY: Erbitux single-agent beginning on (03/17/14)  INTERVAL HISTORY: [name] 63 y.o. 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     Adenocarcinoma of rectum   07/07/2010 Initial Diagnosis Adenocarcinoma of rectum   02/24/2014 Progression CT CAP-  Findings within the chest are worrisome for recurrent tumor with bilateral pulmonary metastasis.   03/17/2014 -  Chemotherapy Erbitux single agent   I personally reviewed and went over laboratory results with the patient.  The results are noted within this dictation.  Last week, [name] with the complaint of passing his ureter stent in his urine.  He reports that it exited his penis urethra which hematuria.  He was advised to contact his urologist regarding this issue. He has not yet been in touch with urology and he will attempt again today.  Since passing his stent, he appreciates blood in his urine.  Today [name] will be getting his chemotherapy and IV feraheme as planned for iron deficiency.  He saw Dr. Arnoldo Morale for his left posterior head cyst and that is healing up nicely.  Dr.  Arnoldo Morale also ordered a 10 day course of antibiotics for [name], but the patient does not recall the name of the antibiotic.   He notes a left leg discomfort with edema.  He blames the swelling on crossing his leg/ankle when he sleeps in his recliner.  He has been keeping it elevated.  Given the unilateral edema with discomfort, in addition to his malignancy and treatment, evaluation for DVT is needed.  He denies any SOB or chest pain/pressure/discomfort.  Oncologically, he otherwise denies any complaints and ROS questioning is negative.   Past Medical History  Diagnosis Date  . Pancytopenia, acquired 12/04/2010  . B12 deficiency 12/07/2010  . Kidney stones   . CAD (coronary artery disease)   . Anemia   . History of radiation therapy 06/19/07 -07/27/07    whole pelvis  . Hx of radiation therapy 08/06/12,08/09/12,& 08/13/12    rul 54GY/39f  . Cancer     Colon ca dx 2008 surg/rad/chemo  . Lung cancer   . FH: chemotherapy   . Myocardial infarction   . Rectal bleeding 09/20/2013    From recurrent rectal mass/recurrent rectal adenocarcinoma  . Acute blood loss anemia 09/27/2013  . 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  2.7 cm primary, moderately differentiated with 3/8 positive lymph nodes without LVI.  Surgery was on 04/22/2007 and was treated postoperatively with radiation and concomitant capecitabine and then 4 cycles of a planned 6 with oxaliplatin and capecitabine.  His counts would not allow treatment  of  . Hydronephrosis of left kidney 09/24/2013    Kidney stones; s/p cystoscopy and stent  . Gross hematuria 09/25/2103  . Lung metastases 07/09/2013  . Secondary malignancy of right lung 07/09/2013    has DIABETES MELLITUS, TYPE II; HYPERCHOLESTEROLEMIA; ANEMIA, CHRONIC; CORONARY ARTERY DISEASE; SLEEP APNEA; Adenocarcinoma of rectum; Pancytopenia, acquired; B12 deficiency; Port catheter in place; Hx  of radiation therapy; Thrombocytopenia; Secondary malignancy of right lung; Rectal bleeding; ARF (acute renal failure); Hydronephrosis; Hydronephrosis of left kidney; GI bleeding; Acute blood loss anemia; Urinary retention; and Gross hematuria on his problem list.     is allergic to daypro and xeloda.  [name] had no medications administered during this visit.  Past Surgical History  Procedure Laterality Date  . Ileostomy  12/07/2010    Procedure: ILEOSTOMY;  Surgeon: Donato Heinz;  Location: [facility];  Service: General;  Laterality: N/A;  Diverting Ileostomy Lysis of adhesions Exploratory Laparotomy  . Lung biopsy  10/27/10    rt lobe- adenocarcinoma  . Cholecystectomy    . Cataract extraction w/ intraocular lens implant  2007    bil  . Lithotripsy  2002  . Coronary angioplasty with stent placement  2003    stenting x 2  . Colon resection  2008    x2  . Esophagogastroduodenoscopy N/A 05/02/2013    Dr. Gala Romney- polypoid antral fold, bx= reactive gastropathy  . Givens capsule study N/A 05/02/2013    Procedure: GIVENS CAPSULE STUDY;  Surgeon: Daneil Dolin, MD;  Location: [facility] SU[facility];  Service: Endoscopy;  Laterality: N/A;  . Colonoscopy N/A 09/20/2013    SLF:A near circumferential fungating mass with friable surfaces was found in the rectum.  ACTIVELY OOZING.  CLOTS SEEN IN LUMEN AND ASPIRATED.  Multiple biopsies were performed using cold forceps.  Thermal therapy(BICAP 7 Fr 25W) was used to ATTEMPT TO control bleeding.    HEMOSTASIS NOT ACHEIVED.  Marland Kitchen Cystoscopy w/ ureteral stent placement Left 09/24/2013    Procedure: CYSTOSCOPY WITH LEFT RETROGRADE PYELOGRAM; LEFT URETERAL STENT PLACEMENT; REMOVAL OF SMALL BLADDER CALCULI;  Surgeon: Marissa Nestle, MD;  Location: [facility];  Service: Urology;  Laterality: Left;  . Flexible sigmoidoscopy N/A 09/25/2013    HRC:BULAGTXMI exophytic rectal mass most likely representing recurrent adenocarcinoma status post biopsy    Denies any headaches,  dizziness, double vision, fevers, chills, night sweats, nausea, vomiting, diarrhea, constipation, chest pain, heart palpitations, shortness of breath, blood in stool, black tarry stool, urinary pain, urinary burning, urinary frequency.   PHYSICAL EXAMINATION  ECOG PERFORMANCE STATUS: 1 - Symptomatic but completely ambulatory  Filed Vitals:   04/21/14 0800  BP: 135/50  Pulse: 98  Resp: 18    GENERAL:alert, no distress, well nourished, well developed, comfortable, cooperative, pale and smiling, in chemo-bed receiving IV iron SKIN: skin color, texture, turgor are normal, no rashes or significant lesions HEAD: Normocephalic, No masses, lesions, tenderness or abnormalities EYES: normal, PERRLA, EOMI, Conjunctiva are pink and non-injected EARS: External ears normal OROPHARYNX:lips, buccal mucosa, and tongue normal and mucous membranes are moist  NECK: supple, thyroid normal size, non-tender, without nodularity, no stridor, non-tender, trachea midline LYMPH:  not examined BREAST:not examined LUNGS: not exained HEART: not examined ABDOMEN:not examined BACK: Back symmetric, no curvature. EXTREMITIES:less then 2 second capillary refill, no skin discoloration, no cyanosis, positive findings:  edema left unilateral edema 1-2+ pitting with discomfort in the lower leg about 6-10 cm superior to ankle. NEURO: alert & oriented x 3 with fluent speech, no focal motor/sensory deficits  LABORATORY DATA: CBC    Component Value Date/Time   WBC 8.7 04/21/2014 0842   RBC 1.79* 04/21/2014 0842   HGB 5.8* 04/21/2014 0842   HCT 16.7* 04/21/2014 0842   PLT 44* 04/21/2014 0842   MCV 93.3 04/21/2014 0842   MCH 32.4 04/21/2014 0842   MCHC 34.7 04/21/2014 0842   RDW 19.4* 04/21/2014 0842   LYMPHSABS 0.5* 04/21/2014 0842   MONOABS 0.6 04/21/2014 0842   EOSABS 0.1 04/21/2014 0842   BASOSABS 0.0 04/21/2014 0842      Chemistry      Component Value Date/Time   NA 130* 04/21/2014 0842   K 5.2  04/21/2014 0842   CL 99 04/21/2014 0842   CO2 12* 04/21/2014 0842   BUN 88* 04/21/2014 0842   CREATININE 12.47* 04/21/2014 0842      Component Value Date/Time   CALCIUM 8.1* 04/21/2014 0842   ALKPHOS 91 04/21/2014 0842   AST 27 04/21/2014 0842   ALT 24 04/21/2014 0842   BILITOT 0.7 04/21/2014 0842     Lab Results  Component Value Date   CEA 4.6 04/14/2014     ASSESSMENT: 1. Progressive metastatic rectal cancer (KRAS wild type) beginning systemic chemotherapy with single-agent Erbitux on 03/17/2014. 2. Right apex of lung nodule (7.5 mm) and left upper lobe lesion (9 mm), concerning for malignancy treated with SBRT by Dr. [name] on 2/23, 2/25, 3/3, and 08/02/2013.  3. Suspicious local recurrence at anastomotic site, S/P radiation by Dr. [name]  4. Pancytopenia, stable  5. Splenomegaly and cirrhosis of liver  6. Pernicious anemia, on Vitamin B 12 IM replacmenet  7. Small bowel erosions per camera capsule study by Dr. Gala Romney.  8. Recurrent renal calculi, referral to urology made.  9. Iron deficiency anemia secondary to blood loss and malabsorption from chronic PPI use. S/P IV Feraheme on 6/24 and 11/27/2013. 10. Erbitux-induced acne rash, resolved with Cleocin-T 11. Left LE edema with discomfort 12. Significant elevation in creatinine, still producing urine.  Obstructive uropathy is of concern. 13. Acute blood loss anemia, secondary to GU blood loss. Erbitux therapy dose not affect RBC production, therefore, this is not chemotherapy (Monoclonal Antibody)-induced  Patient Active Problem List   Diagnosis Date Noted  . Urinary retention 09/28/2013  . Gross hematuria 09/28/2013  . Acute blood loss anemia 09/27/2013  . GI bleeding 09/25/2013  . Hydronephrosis of left kidney 09/24/2013  . Hydronephrosis 09/22/2013  . Rectal bleeding 09/20/2013  . ARF (acute renal failure) 09/20/2013  . Secondary malignancy of right lung 07/09/2013  . Thrombocytopenia 05/01/2013  . Hx of  radiation therapy   . Port catheter in place 09/04/2012  . B12 deficiency 12/07/2010  . Pancytopenia, acquired 12/04/2010  . DIABETES MELLITUS, TYPE II 07/07/2010  . HYPERCHOLESTEROLEMIA 07/07/2010  . ANEMIA, CHRONIC 07/07/2010  . CORONARY ARTERY DISEASE 07/07/2010  . SLEEP APNEA 07/07/2010  . Adenocarcinoma of rectum 07/07/2010     PLAN:  1. I personally reviewed and went over laboratory results with the patient.  The results are noted within this dictation. 2. Pre-chemo labs as planned 3. Chemotherapy as planned 4. IV Feraheme today as ordered 5. [place] of left leg to evaluate for DVT 6. I called [facility] regarding the patient's case and Dr. Ralene Muskrat triage nurse has taken my pager number for Dr. Jeffie Pollock.  Patient's current appointment with Dr. Beatrix Fetters is 06/03/2014 at 10 AM and I think the patient needs to be seen sooner by urology.   7. 2 unit PRBC transfusion tomorrow 8.  [place] of kidneys today 9. Return in 3 weeks for follow-up   THERAPY PLAN:  We will treat him with single-agent Erbitux. At some time, Irinotecan can be added if needed.  All questions were answered. The patient knows to call the clinic with any problems, questions or concerns. We can certainly see the patient much sooner if necessary.  Patient and plan discussed with Dr. Farrel Gobble and he is in agreement with the aforementioned.   Tyson Parkison 04/21/2014    Addendum:  The following data is from Left LE Doppler US and [place] of bilateral kidneys:   CLINICAL DATA: Elevated serum creatinine.  EXAM: RENAL/URINARY TRACT ULTRASOUND COMPLETE  COMPARISON: CT scan of February 24, 2014.  FINDINGS: Right Kidney:  Length: 13 cm. Moderate hydronephrosis is noted. 1.5 mm calculus is noted in lower pole collecting system 1.4 cm cyst is noted in midpole.  Left Kidney:  Length: 14.3 cm. Moderate left hydronephrosis is noted. Multiple stones are noted. 3.3 cm cyst is seen in lower  pole.  Bladder:  Echogenic material is noted posteriorly within lumen of urinary bladder which may represent blood clot, but mass cannot be excluded. Ureteral jets are not visualized.  IMPRESSION: Moderate bilateral hydronephrosis and nephrocalcinosis is noted. Bilateral renal cysts are noted. Echogenic material is noted posteriorly within lumen of urinary bladder which may represent blood clot, but neoplasm cannot be excluded and further evaluation with cystoscopy is recommended.   Electronically Signed  By: Sabino Dick M.D.  On: 04/21/2014 12:57     CLINICAL DATA: Left lower extremity pain and edema.  EXAM: Left LOWER EXTREMITY VENOUS DOPPLER ULTRASOUND  TECHNIQUE: Gray-scale sonography with graded compression, as well as color Doppler and duplex ultrasound were performed to evaluate the lower extremity deep venous systems from the level of the common femoral vein and including the common femoral, femoral, profunda femoral, popliteal and calf veins including the posterior tibial, peroneal and gastrocnemius veins when visible. The superficial great saphenous vein was also interrogated. Spectral Doppler was utilized to evaluate flow at rest and with distal augmentation maneuvers in the common femoral, femoral and popliteal veins.  COMPARISON: None.  FINDINGS: Contralateral Common Femoral Vein: Respiratory phasicity is normal and symmetric with the symptomatic side. No evidence of thrombus. Normal compressibility.  Common Femoral Vein: No evidence of thrombus. Normal compressibility, respiratory phasicity and response to augmentation.  Saphenofemoral Junction: No evidence of thrombus. Normal compressibility and flow on color Doppler imaging.  Profunda Femoral Vein: No evidence of thrombus. Normal compressibility and flow on color Doppler imaging.  Femoral Vein: No evidence of thrombus. Normal compressibility, respiratory phasicity and response  to augmentation.  Popliteal Vein: No evidence of thrombus. Normal compressibility, respiratory phasicity and response to augmentation.  Calf Veins: No evidence of thrombus. Normal compressibility and flow on color Doppler imaging.  Superficial Great Saphenous Vein: No evidence of thrombus. Normal compressibility and flow on color Doppler imaging.  Venous Reflux: None.  Other Findings: None.  IMPRESSION: No evidence of deep venous thrombosis seen in left lower extremity.   Electronically Signed  By: Sabino Dick M.D.  On: 04/21/2014 12:51    Patient and plan discussed with Dr. Farrel Gobble and he is in agreement with the aforementioned.   Capri Raben 04/21/2014

## 2014-04-21 ENCOUNTER — Ambulatory Visit (HOSPITAL_COMMUNITY)
Admission: RE | Admit: 2014-04-21 | Discharge: 2014-04-21 | Disposition: A | Discharge status: Home / Self Care | Payer: Managed Care, Other (non HMO) | Source: Ambulatory Visit | Attending: Oncology | Admitting: Oncology

## 2014-04-21 ENCOUNTER — Encounter (HOSPITAL_BASED_OUTPATIENT_CLINIC_OR_DEPARTMENT_OTHER): Payer: Managed Care, Other (non HMO)

## 2014-04-21 ENCOUNTER — Encounter (HOSPITAL_BASED_OUTPATIENT_CLINIC_OR_DEPARTMENT_OTHER): Payer: Managed Care, Other (non HMO) | Admitting: Oncology

## 2014-04-21 VITALS — BP 120/59 | HR 66 | Temp 97.8°F | Resp 16

## 2014-04-21 VITALS — BP 135/50 | HR 98 | Resp 18 | Wt 215.6 lb

## 2014-04-21 DIAGNOSIS — R600 Localized edema: Secondary | ICD-10-CM

## 2014-04-21 DIAGNOSIS — C7801 Secondary malignant neoplasm of right lung: Secondary | ICD-10-CM

## 2014-04-21 DIAGNOSIS — D62 Acute posthemorrhagic anemia: Secondary | ICD-10-CM

## 2014-04-21 DIAGNOSIS — M79605 Pain in left leg: Secondary | ICD-10-CM | POA: Insufficient documentation

## 2014-04-21 DIAGNOSIS — R748 Abnormal levels of other serum enzymes: Secondary | ICD-10-CM | POA: Insufficient documentation

## 2014-04-21 DIAGNOSIS — N139 Obstructive and reflux uropathy, unspecified: Secondary | ICD-10-CM

## 2014-04-21 DIAGNOSIS — N281 Cyst of kidney, acquired: Secondary | ICD-10-CM | POA: Diagnosis not present

## 2014-04-21 DIAGNOSIS — R319 Hematuria, unspecified: Secondary | ICD-10-CM

## 2014-04-21 DIAGNOSIS — N1330 Unspecified hydronephrosis: Secondary | ICD-10-CM | POA: Insufficient documentation

## 2014-04-21 DIAGNOSIS — C20 Malignant neoplasm of rectum: Secondary | ICD-10-CM

## 2014-04-21 DIAGNOSIS — N29 Other disorders of kidney and ureter in diseases classified elsewhere: Secondary | ICD-10-CM | POA: Diagnosis not present

## 2014-04-21 DIAGNOSIS — E538 Deficiency of other specified B group vitamins: Secondary | ICD-10-CM | POA: Diagnosis not present

## 2014-04-21 DIAGNOSIS — R609 Edema, unspecified: Secondary | ICD-10-CM | POA: Diagnosis not present

## 2014-04-21 DIAGNOSIS — R7989 Other specified abnormal findings of blood chemistry: Secondary | ICD-10-CM

## 2014-04-21 DIAGNOSIS — D510 Vitamin B12 deficiency anemia due to intrinsic factor deficiency: Secondary | ICD-10-CM

## 2014-04-21 DIAGNOSIS — Z5112 Encounter for antineoplastic immunotherapy: Secondary | ICD-10-CM

## 2014-04-21 DIAGNOSIS — D500 Iron deficiency anemia secondary to blood loss (chronic): Secondary | ICD-10-CM

## 2014-04-21 LAB — COMPREHENSIVE METABOLIC PANEL
ALBUMIN: 2.2 g/dL — AB (ref 3.5–5.2)
ALK PHOS: 91 U/L (ref 39–117)
ALT: 24 U/L (ref 0–53)
ANION GAP: 19 — AB (ref 5–15)
AST: 27 U/L (ref 0–37)
BILIRUBIN TOTAL: 0.7 mg/dL (ref 0.3–1.2)
BUN: 88 mg/dL — AB (ref 6–23)
CHLORIDE: 99 meq/L (ref 96–112)
CO2: 12 meq/L — AB (ref 19–32)
Calcium: 8.1 mg/dL — ABNORMAL LOW (ref 8.4–10.5)
Creatinine, Ser: 12.47 mg/dL — ABNORMAL HIGH (ref 0.50–1.35)
GFR calc Af Amer: 4 mL/min — ABNORMAL LOW (ref >90)
GFR, EST NON AFRICAN AMERICAN: 4 mL/min — AB (ref >90)
Glucose, Bld: 105 mg/dL — ABNORMAL HIGH (ref 70–99)
POTASSIUM: 5.2 meq/L (ref 3.7–5.3)
Sodium: 130 mEq/L — ABNORMAL LOW (ref 137–147)
Total Protein: 6.1 g/dL (ref 6.0–8.3)

## 2014-04-21 LAB — CBC WITH DIFFERENTIAL/PLATELET
Basophils Absolute: 0 10*3/uL (ref 0.0–0.1)
Basophils Relative: 0 % (ref 0–1)
Eosinophils Absolute: 0.1 10*3/uL (ref 0.0–0.7)
Eosinophils Relative: 1 % (ref 0–5)
HEMATOCRIT: 16.7 % — AB (ref 39.0–52.0)
HEMOGLOBIN: 5.8 g/dL — AB (ref 13.0–17.0)
LYMPHS ABS: 0.5 10*3/uL — AB (ref 0.7–4.0)
LYMPHS PCT: 5 % — AB (ref 12–46)
MCH: 32.4 pg (ref 26.0–34.0)
MCHC: 34.7 g/dL (ref 30.0–36.0)
MCV: 93.3 fL (ref 78.0–100.0)
Monocytes Absolute: 0.6 10*3/uL (ref 0.1–1.0)
Monocytes Relative: 7 % (ref 3–12)
NEUTROS PCT: 87 % — AB (ref 43–77)
Neutro Abs: 7.5 10*3/uL (ref 1.7–7.7)
Platelets: 44 10*3/uL — ABNORMAL LOW (ref 150–400)
RBC: 1.79 MIL/uL — AB (ref 4.22–5.81)
RDW: 19.4 % — ABNORMAL HIGH (ref 11.5–15.5)
WBC: 8.7 10*3/uL (ref 4.0–10.5)

## 2014-04-21 LAB — PREPARE RBC (CROSSMATCH)

## 2014-04-21 MED ORDER — DIPHENHYDRAMINE HCL 50 MG/ML IJ SOLN
50.0000 mg | Freq: Once | INTRAMUSCULAR | Status: AC
  Administered 2014-04-21: 50 mg via INTRAVENOUS
  Filled 2014-04-21: qty 1

## 2014-04-21 MED ORDER — ACETAMINOPHEN 325 MG PO TABS
650.0000 mg | ORAL_TABLET | Freq: Once | ORAL | Status: AC
  Administered 2014-04-21: 650 mg via ORAL
  Filled 2014-04-21: qty 2

## 2014-04-21 MED ORDER — SODIUM CHLORIDE 0.9 % IV SOLN
Freq: Once | INTRAVENOUS | Status: AC
  Administered 2014-04-21: 09:00:00 via INTRAVENOUS

## 2014-04-21 MED ORDER — SODIUM CHLORIDE 0.9 % IJ SOLN: 10.0000 mL | INTRAMUSCULAR | Status: DC | PRN

## 2014-04-21 MED ORDER — SODIUM CHLORIDE 0.9 % IV SOLN
250.0000 mL | Freq: Once | INTRAVENOUS | Status: AC
  Administered 2014-04-21: 250 mL via INTRAVENOUS

## 2014-04-21 MED ORDER — HEPARIN SOD (PORK) LOCK FLUSH 100 UNIT/ML IV SOLN
500.0000 [IU] | Freq: Once | INTRAVENOUS | Status: AC | PRN
  Administered 2014-04-21: 500 [IU]
  Filled 2014-04-21: qty 5

## 2014-04-21 MED ORDER — SODIUM CHLORIDE 0.9 % IV SOLN
510.0000 mg | Freq: Once | INTRAVENOUS | Status: AC
  Administered 2014-04-21: 510 mg via INTRAVENOUS
  Filled 2014-04-21: qty 17

## 2014-04-21 MED ORDER — SODIUM CHLORIDE 0.9 % IJ SOLN
10.0000 mL | INTRAMUSCULAR | Status: DC | PRN
  Administered 2014-04-21: 10 mL
  Filled 2014-04-21: qty 10

## 2014-04-21 MED ORDER — CETUXIMAB CHEMO IV INJECTION 200 MG/100ML
250.0000 mg/m2 | Freq: Once | INTRAVENOUS | Status: AC
  Administered 2014-04-21: 500 mg via INTRAVENOUS
  Filled 2014-04-21: qty 250

## 2014-04-21 NOTE — Progress Notes (Signed)
Tolerated chemo well. Tolerated blood transfusion without s/s adverse reaction.

## 2014-04-21 NOTE — Patient Instructions (Signed)
[facility] Discharge Instructions  RECOMMENDATIONS MADE BY THE CONSULTANT AND ANY TEST RESULTS WILL BE SENT TO YOUR REFERRING PHYSICIAN.  IV iron given today IV chemotherapy given today   Thank you for choosing [facility] to provide your oncology and hematology care.  To afford each patient quality time with our providers, please arrive at least 15 minutes before your scheduled appointment time.  With your help, our goal is to use those 15 minutes to complete the necessary work-up to ensure our physicians have the information they need to help with your evaluation and healthcare recommendations.    Effective January 1st, 2014, we ask that you re-schedule your appointment with our physicians should you arrive 10 or more minutes late for your appointment.  We strive to give you quality time with our providers, and arriving late affects you and other patients whose appointments are after yours.    Again, thank you for choosing [facility].  Our hope is that these requests will decrease the amount of time that you wait before being seen by our physicians.       _____________________________________________________________  Should you have questions after your visit to [facility], please contact our office at [phone] between the hours of 8:30 a.m. and 5:00 p.m.  Voicemails left after 4:30 p.m. will not be returned until the following business day.  For prescription refill requests, have your pharmacy contact our office with your prescription refill request.

## 2014-04-21 NOTE — Patient Instructions (Signed)
[facility] Discharge Instructions for Patients Receiving Chemotherapy  Today you received the following chemotherapy agents Erbitux  To help prevent nausea and vomiting after your treatment, we encourage you to take your nausea medication as instructed.   If you develop nausea and vomiting that is not controlled by your nausea medication, call the clinic. If it is after clinic hours your family physician or the after hours number for the clinic or go to the Emergency Department.   BELOW ARE SYMPTOMS THAT SHOULD BE REPORTED IMMEDIATELY:  *FEVER GREATER THAN 101.0 F  *CHILLS WITH OR WITHOUT FEVER  NAUSEA AND VOMITING THAT IS NOT CONTROLLED WITH YOUR NAUSEA MEDICATION  *UNUSUAL SHORTNESS OF BREATH  *UNUSUAL BRUISING OR BLEEDING  TENDERNESS IN MOUTH AND THROAT WITH OR WITHOUT PRESENCE OF ULCERS  *URINARY PROBLEMS  *BOWEL PROBLEMS  UNUSUAL RASH Items with * indicate a potential emergency and should be followed up as soon as possible.  I have been informed and understand all the instructions given to me. I know to contact the clinic, my physician, or go to the Emergency Department if any problems should occur. I do not have any questions at this time, but understand that I may call the clinic during office hours or the Patient Navigator at [phone] should I have any questions or need assistance in obtaining follow up care.    __________________________________________  _____________  __________ Signature of Patient or Authorized Representative            Date                   Time    __________________________________________ Nurse's Signature

## 2014-04-21 NOTE — Addendum Note (Signed)
Addended by: Baird Cancer on: 04/21/2014 11:11 AM   Modules accepted: Orders

## 2014-04-21 NOTE — Progress Notes (Signed)
CRITICAL VALUE ALERT Critical value received:  Hgb 5.8 Date of notification:  11.23.15 Time of notification: 1000 Critical value read back:  Yes.   Nurse who received alert:  Daylynn Stumpp, Hermenia Fiscal, RN MD notified (1st page):  T. Sheldon Silvan, PA-C   Orders received for transfuse 2 units PRBCs.  Ethelle Lyon, RN advised of same.

## 2014-04-22 ENCOUNTER — Telehealth (HOSPITAL_COMMUNITY): Payer: Self-pay

## 2014-04-22 ENCOUNTER — Telehealth (HOSPITAL_COMMUNITY): Payer: Self-pay | Admitting: *Deleted

## 2014-04-22 ENCOUNTER — Other Ambulatory Visit (HOSPITAL_COMMUNITY): Payer: Self-pay | Admitting: Hematology and Oncology

## 2014-04-22 LAB — TYPE AND SCREEN
ABO/RH(D): A POS
ANTIBODY SCREEN: NEGATIVE
UNIT DIVISION: 0
Unit division: 0

## 2014-04-22 MED ORDER — HYDROCORTISONE ACETATE-UREA 1-10 % EX CREA: TOPICAL_CREAM | Freq: Three times a day (TID) | CUTANEOUS | Status: DC

## 2014-04-22 NOTE — Telephone Encounter (Signed)
Erbitux kit will be given to patient tomorrow 04/23/14. Wife to come by and pick it up.

## 2014-04-22 NOTE — Telephone Encounter (Signed)
Patient notified and will get med from pharmacy.

## 2014-04-22 NOTE — Telephone Encounter (Signed)
-----   Message from Farrel Gobble, MD sent at 04/22/2014  3:49 PM EST -----  Sounds like dermatologic  Toxicity from Erbitux.Will order combo urea/hydrocortisone cream from pharmacy (Carmol [facility] or U-Cort). Do we have any starter kits with creams in them to give as samples? Ask or check with [facility].

## 2014-04-22 NOTE — Telephone Encounter (Signed)
Call from patient with complaints of bright red rash on palms of hands, soles and top of feet and ankles.  Areas burn and are tender to touch. Denies any itching.   Feet and ankles are a little swollen and hurts to walk on them.  Is trying to keep feet elevated as much as he can.  Wants to know if there is anything that he can do/get for relief?

## 2014-04-23 ENCOUNTER — Other Ambulatory Visit (HOSPITAL_COMMUNITY): Payer: Self-pay | Admitting: Oncology

## 2014-04-23 DIAGNOSIS — L271 Localized skin eruption due to drugs and medicaments taken internally: Secondary | ICD-10-CM

## 2014-04-23 MED ORDER — UREA 10 % EX CREA
TOPICAL_CREAM | CUTANEOUS | Status: DC | PRN
Start: 2014-04-23 — End: 2014-10-11

## 2014-04-23 MED ORDER — HYDROCORTISONE 1 % EX CREA: 1.0000 "application " | TOPICAL_CREAM | Freq: Two times a day (BID) | CUTANEOUS | Status: DC

## 2014-04-28 ENCOUNTER — Encounter (HOSPITAL_COMMUNITY): Payer: Self-pay

## 2014-04-28 ENCOUNTER — Emergency Department (HOSPITAL_COMMUNITY): Payer: Managed Care, Other (non HMO)

## 2014-04-28 ENCOUNTER — Other Ambulatory Visit: Payer: Self-pay

## 2014-04-28 ENCOUNTER — Encounter (HOSPITAL_COMMUNITY): Payer: Self-pay | Admitting: *Deleted

## 2014-04-28 ENCOUNTER — Encounter (HOSPITAL_BASED_OUTPATIENT_CLINIC_OR_DEPARTMENT_OTHER): Payer: Managed Care, Other (non HMO) | Admitting: Oncology

## 2014-04-28 ENCOUNTER — Inpatient Hospital Stay (HOSPITAL_COMMUNITY)
Admission: EM | Admit: 2014-04-28 | Discharge: 2014-05-18 | DRG: 659 | Disposition: A | Discharge status: Home / Self Care | Payer: Managed Care, Other (non HMO) | Attending: Internal Medicine | Admitting: Internal Medicine

## 2014-04-28 ENCOUNTER — Encounter (HOSPITAL_BASED_OUTPATIENT_CLINIC_OR_DEPARTMENT_OTHER): Payer: Managed Care, Other (non HMO)

## 2014-04-28 VITALS — BP 140/62 | HR 91 | Temp 97.8°F | Resp 20 | Wt 220.0 lb

## 2014-04-28 DIAGNOSIS — C20 Malignant neoplasm of rectum: Secondary | ICD-10-CM

## 2014-04-28 DIAGNOSIS — E8729 Other acidosis: Secondary | ICD-10-CM

## 2014-04-28 DIAGNOSIS — E43 Unspecified severe protein-calorie malnutrition: Secondary | ICD-10-CM | POA: Diagnosis present

## 2014-04-28 DIAGNOSIS — Z87891 Personal history of nicotine dependence: Secondary | ICD-10-CM

## 2014-04-28 DIAGNOSIS — E875 Hyperkalemia: Secondary | ICD-10-CM

## 2014-04-28 DIAGNOSIS — R109 Unspecified abdominal pain: Secondary | ICD-10-CM | POA: Diagnosis present

## 2014-04-28 DIAGNOSIS — D696 Thrombocytopenia, unspecified: Secondary | ICD-10-CM

## 2014-04-28 DIAGNOSIS — E538 Deficiency of other specified B group vitamins: Secondary | ICD-10-CM | POA: Diagnosis present

## 2014-04-28 DIAGNOSIS — D62 Acute posthemorrhagic anemia: Secondary | ICD-10-CM

## 2014-04-28 DIAGNOSIS — E878 Other disorders of electrolyte and fluid balance, not elsewhere classified: Secondary | ICD-10-CM

## 2014-04-28 DIAGNOSIS — Z9841 Cataract extraction status, right eye: Secondary | ICD-10-CM

## 2014-04-28 DIAGNOSIS — E669 Obesity, unspecified: Secondary | ICD-10-CM | POA: Diagnosis present

## 2014-04-28 DIAGNOSIS — D6959 Other secondary thrombocytopenia: Secondary | ICD-10-CM | POA: Diagnosis present

## 2014-04-28 DIAGNOSIS — N179 Acute kidney failure, unspecified: Secondary | ICD-10-CM

## 2014-04-28 DIAGNOSIS — D500 Iron deficiency anemia secondary to blood loss (chronic): Secondary | ICD-10-CM

## 2014-04-28 DIAGNOSIS — N390 Urinary tract infection, site not specified: Secondary | ICD-10-CM | POA: Diagnosis present

## 2014-04-28 DIAGNOSIS — IMO0002 Reserved for concepts with insufficient information to code with codable children: Secondary | ICD-10-CM

## 2014-04-28 DIAGNOSIS — E876 Hypokalemia: Secondary | ICD-10-CM | POA: Diagnosis not present

## 2014-04-28 DIAGNOSIS — D539 Nutritional anemia, unspecified: Secondary | ICD-10-CM

## 2014-04-28 DIAGNOSIS — R310 Gross hematuria: Secondary | ICD-10-CM | POA: Diagnosis present

## 2014-04-28 DIAGNOSIS — I129 Hypertensive chronic kidney disease with stage 1 through stage 4 chronic kidney disease, or unspecified chronic kidney disease: Secondary | ICD-10-CM | POA: Diagnosis present

## 2014-04-28 DIAGNOSIS — N200 Calculus of kidney: Secondary | ICD-10-CM

## 2014-04-28 DIAGNOSIS — K632 Fistula of intestine: Secondary | ICD-10-CM

## 2014-04-28 DIAGNOSIS — E872 Acidosis, unspecified: Secondary | ICD-10-CM

## 2014-04-28 DIAGNOSIS — N183 Chronic kidney disease, stage 3 (moderate): Secondary | ICD-10-CM | POA: Diagnosis present

## 2014-04-28 DIAGNOSIS — D649 Anemia, unspecified: Secondary | ICD-10-CM | POA: Diagnosis present

## 2014-04-28 DIAGNOSIS — A4181 Sepsis due to Enterococcus: Secondary | ICD-10-CM | POA: Diagnosis not present

## 2014-04-28 DIAGNOSIS — Z9842 Cataract extraction status, left eye: Secondary | ICD-10-CM | POA: Diagnosis not present

## 2014-04-28 DIAGNOSIS — G4730 Sleep apnea, unspecified: Secondary | ICD-10-CM | POA: Diagnosis present

## 2014-04-28 DIAGNOSIS — N131 Hydronephrosis with ureteral stricture, not elsewhere classified: Secondary | ICD-10-CM

## 2014-04-28 DIAGNOSIS — Z6831 Body mass index (BMI) 31.0-31.9, adult: Secondary | ICD-10-CM

## 2014-04-28 DIAGNOSIS — I252 Old myocardial infarction: Secondary | ICD-10-CM | POA: Diagnosis not present

## 2014-04-28 DIAGNOSIS — I2510 Atherosclerotic heart disease of native coronary artery without angina pectoris: Secondary | ICD-10-CM | POA: Diagnosis present

## 2014-04-28 DIAGNOSIS — R7989 Other specified abnormal findings of blood chemistry: Secondary | ICD-10-CM

## 2014-04-28 DIAGNOSIS — N139 Obstructive and reflux uropathy, unspecified: Secondary | ICD-10-CM | POA: Diagnosis present

## 2014-04-28 DIAGNOSIS — N202 Calculus of kidney with calculus of ureter: Secondary | ICD-10-CM | POA: Diagnosis present

## 2014-04-28 DIAGNOSIS — R7981 Abnormal blood-gas level: Secondary | ICD-10-CM

## 2014-04-28 DIAGNOSIS — R319 Hematuria, unspecified: Secondary | ICD-10-CM | POA: Diagnosis present

## 2014-04-28 DIAGNOSIS — D692 Other nonthrombocytopenic purpura: Secondary | ICD-10-CM

## 2014-04-28 DIAGNOSIS — R748 Abnormal levels of other serum enzymes: Secondary | ICD-10-CM

## 2014-04-28 DIAGNOSIS — N1330 Unspecified hydronephrosis: Secondary | ICD-10-CM

## 2014-04-28 DIAGNOSIS — K7460 Unspecified cirrhosis of liver: Secondary | ICD-10-CM

## 2014-04-28 DIAGNOSIS — C7801 Secondary malignant neoplasm of right lung: Secondary | ICD-10-CM

## 2014-04-28 LAB — PREPARE RBC (CROSSMATCH)

## 2014-04-28 LAB — COMPREHENSIVE METABOLIC PANEL
ALBUMIN: 2 g/dL — AB (ref 3.5–5.2)
ALK PHOS: 76 U/L (ref 39–117)
ALT: 34 U/L (ref 0–53)
AST: 39 U/L — AB (ref 0–37)
BUN: 113 mg/dL — ABNORMAL HIGH (ref 6–23)
CHLORIDE: 101 meq/L (ref 96–112)
CO2: 9 mEq/L — CL (ref 19–32)
CREATININE: 18.42 mg/dL — AB (ref 0.50–1.35)
Calcium: 7.9 mg/dL — ABNORMAL LOW (ref 8.4–10.5)
GFR calc Af Amer: 3 mL/min — ABNORMAL LOW (ref >90)
GFR calc non Af Amer: 2 mL/min — ABNORMAL LOW (ref >90)
Glucose, Bld: 140 mg/dL — ABNORMAL HIGH (ref 70–99)
Potassium: 5.4 mEq/L — ABNORMAL HIGH (ref 3.7–5.3)
Sodium: 134 mEq/L — ABNORMAL LOW (ref 137–147)
Total Bilirubin: 0.6 mg/dL (ref 0.3–1.2)
Total Protein: 6 g/dL (ref 6.0–8.3)

## 2014-04-28 LAB — CBC WITH DIFFERENTIAL/PLATELET
Basophils Absolute: 0 10*3/uL (ref 0.0–0.1)
Basophils Relative: 0 % (ref 0–1)
Eosinophils Absolute: 0.1 10*3/uL (ref 0.0–0.7)
Eosinophils Relative: 1 % (ref 0–5)
HCT: 15 % — ABNORMAL LOW (ref 39.0–52.0)
Hemoglobin: 4.6 g/dL — CL (ref 13.0–17.0)
LYMPHS PCT: 4 % — AB (ref 12–46)
Lymphs Abs: 0.4 10*3/uL — ABNORMAL LOW (ref 0.7–4.0)
MCH: 32.6 pg (ref 26.0–34.0)
MCHC: 30.7 g/dL (ref 30.0–36.0)
MCV: 106.4 fL — ABNORMAL HIGH (ref 78.0–100.0)
Monocytes Absolute: 0.5 10*3/uL (ref 0.1–1.0)
Monocytes Relative: 5 % (ref 3–12)
Neutro Abs: 9.5 10*3/uL — ABNORMAL HIGH (ref 1.7–7.7)
Neutrophils Relative %: 90 % — ABNORMAL HIGH (ref 43–77)
Platelets: 52 10*3/uL — ABNORMAL LOW (ref 150–400)
RBC: 1.41 MIL/uL — ABNORMAL LOW (ref 4.22–5.81)
RDW: 24.4 % — AB (ref 11.5–15.5)
WBC: 10.5 10*3/uL (ref 4.0–10.5)

## 2014-04-28 LAB — MAGNESIUM: Magnesium: 2.5 mg/dL (ref 1.5–2.5)

## 2014-04-28 LAB — CBC
HEMATOCRIT: 19.3 % — AB (ref 39.0–52.0)
HEMOGLOBIN: 6.4 g/dL — AB (ref 13.0–17.0)
MCH: 32.3 pg (ref 26.0–34.0)
MCHC: 33.2 g/dL (ref 30.0–36.0)
MCV: 97.5 fL (ref 78.0–100.0)
Platelets: 36 10*3/uL — ABNORMAL LOW (ref 150–400)
RBC: 1.98 MIL/uL — ABNORMAL LOW (ref 4.22–5.81)
RDW: 20.9 % — AB (ref 11.5–15.5)
WBC: 9.8 10*3/uL (ref 4.0–10.5)

## 2014-04-28 MED ORDER — HYDROMORPHONE HCL 1 MG/ML IJ SOLN
1.0000 mg | Freq: Once | INTRAMUSCULAR | Status: AC
  Administered 2014-04-28: 1 mg via INTRAVENOUS
  Filled 2014-04-28: qty 1

## 2014-04-28 MED ORDER — HEPARIN SOD (PORK) LOCK FLUSH 100 UNIT/ML IV SOLN: 500.0000 [IU] | Freq: Every day | INTRAVENOUS | Status: DC | PRN

## 2014-04-28 MED ORDER — HYDROCODONE-ACETAMINOPHEN 5-325 MG PO TABS
1.0000 | ORAL_TABLET | Freq: Four times a day (QID) | ORAL | Status: DC | PRN
  Administered 2014-04-28 – 2014-05-01 (×6): 2 via ORAL
  Administered 2014-05-02: 1 via ORAL
  Administered 2014-05-04 – 2014-05-11 (×7): 2 via ORAL
  Administered 2014-05-12 (×2): 1 via ORAL
  Administered 2014-05-13 – 2014-05-16 (×6): 2 via ORAL
  Administered 2014-05-17: 1 via ORAL
  Filled 2014-04-28: qty 2
  Filled 2014-04-28: qty 1
  Filled 2014-04-28 (×3): qty 2
  Filled 2014-04-28: qty 1
  Filled 2014-04-28 (×5): qty 2
  Filled 2014-04-28: qty 1
  Filled 2014-04-28 (×8): qty 2
  Filled 2014-04-28: qty 1
  Filled 2014-04-28 (×3): qty 2

## 2014-04-28 MED ORDER — SODIUM CHLORIDE 0.9 % IV SOLN
INTRAVENOUS | Status: DC
  Administered 2014-04-29: via INTRAVENOUS

## 2014-04-28 MED ORDER — FOLIC ACID 1 MG PO TABS
1.0000 mg | ORAL_TABLET | Freq: Every day | ORAL | Status: DC
Start: 2014-04-28 — End: 2014-10-15

## 2014-04-28 MED ORDER — ONDANSETRON HCL 4 MG PO TABS: 8.0000 mg | ORAL_TABLET | Freq: Two times a day (BID) | ORAL | Status: DC | PRN

## 2014-04-28 MED ORDER — PANTOPRAZOLE SODIUM 40 MG PO TBEC
40.0000 mg | DELAYED_RELEASE_TABLET | Freq: Every day | ORAL | Status: DC
  Administered 2014-04-30 – 2014-05-18 (×17): 40 mg via ORAL
  Filled 2014-04-28 (×18): qty 1

## 2014-04-28 MED ORDER — SODIUM CHLORIDE 0.9 % IJ SOLN
10.0000 mL | INTRAMUSCULAR | Status: AC | PRN
  Administered 2014-04-28: 10 mL

## 2014-04-28 MED ORDER — SODIUM CHLORIDE 0.9 % IV BOLUS (SEPSIS)
1000.0000 mL | Freq: Once | INTRAVENOUS | Status: AC
  Administered 2014-04-28: 1000 mL via INTRAVENOUS

## 2014-04-28 MED ORDER — DIPHENHYDRAMINE HCL 25 MG PO CAPS
25.0000 mg | ORAL_CAPSULE | Freq: Once | ORAL | Status: AC
  Administered 2014-04-28: 25 mg via ORAL
  Filled 2014-04-28: qty 1

## 2014-04-28 MED ORDER — SODIUM CHLORIDE 0.9 % IV SOLN: 10.0000 mL/h | Freq: Once | INTRAVENOUS | Status: DC

## 2014-04-28 MED ORDER — SODIUM CHLORIDE 0.9 % IJ SOLN
3.0000 mL | Freq: Two times a day (BID) | INTRAMUSCULAR | Status: DC
  Administered 2014-04-30 – 2014-05-16 (×20): 3 mL via INTRAVENOUS

## 2014-04-28 MED ORDER — HYDROCORTISONE 1 % EX CREA
1.0000 "application " | TOPICAL_CREAM | Freq: Two times a day (BID) | CUTANEOUS | Status: DC
  Administered 2014-04-29 – 2014-05-18 (×35): 1 via TOPICAL
  Filled 2014-04-28 (×33): qty 28

## 2014-04-28 MED ORDER — UREA 10 % EX CREA: TOPICAL_CREAM | CUTANEOUS | Status: DC | PRN

## 2014-04-28 MED ORDER — HYDROCORTISONE 1 % EX CREA
TOPICAL_CREAM | Freq: Three times a day (TID) | CUTANEOUS | Status: DC | PRN
  Filled 2014-04-28 (×2): qty 28

## 2014-04-28 MED ORDER — SODIUM POLYSTYRENE SULFONATE 15 GM/60ML PO SUSP
45.0000 g | Freq: Once | ORAL | Status: AC
  Administered 2014-04-29: 45 g via ORAL
  Filled 2014-04-28: qty 180

## 2014-04-28 MED ORDER — SODIUM CHLORIDE 0.9 % IV SOLN
250.0000 mL | Freq: Once | INTRAVENOUS | Status: AC
  Administered 2014-04-28: 250 mL via INTRAVENOUS

## 2014-04-28 MED ORDER — FOLIC ACID 1 MG PO TABS
1.0000 mg | ORAL_TABLET | Freq: Every day | ORAL | Status: DC
  Administered 2014-04-30 – 2014-05-18 (×17): 1 mg via ORAL
  Filled 2014-04-28 (×20): qty 1

## 2014-04-28 MED ORDER — CYANOCOBALAMIN 1000 MCG/ML IJ SOLN
1000.0000 ug | INTRAMUSCULAR | Status: DC
  Filled 2014-04-28: qty 1

## 2014-04-28 NOTE — H&P (Addendum)
Hospitalist Admission History and Physical  Patient name: [name] Medical record number: [id number] Date of birth: 12-14-1950 Age: 63 y.o. Gender: male  Primary Care Provider: [name], MD  Chief Complaint: hematuria, AKI, obstructive uropathy, anemia, nephrolithiasis, metabolic acidosis   History of Present Illness:This is a 63 y.o. year old male with significant past medical history of metastatic colon ca-currently on chemotherapy, anemia, CAD, hx/o hydronephrosis, nephrolithiasis  presenting with hematuria, AKI, obstructive uropathy, anemia. Patient self reports a recurrent history of nephrolithiasis and hematuria over the past 2 or 3 years. Most recent admission was April of this year for similar symptoms. States that a ureteral stent was placed for left hydronephrosis. Patient states that he's had about 2-3 weeks of intermittent hematuria. Patient reports an episode where he believes he passed his ureteral stent while using the bathroom. Presented to his hematologist/oncologist today for follow-up for chemotherapy. Reported episodes of hematuria. He is at progressively worsening renal function over the past 2-3 weeks with a creatinine that increased from 1.7-18.4 today. Bicarbonate of 9. Anion gap of 24. Hematology/oncology spoke with urology via Dr. Laural Golden. Initial plan was for follow-up with urology tomorrow. However, patient was directed to the ER for further evaluation given renal insufficiency. On presentation temperature 97.4, heart rate in the 70s to 100s, respirations in the tens to 20s, blood pressure in the 110s to 140s, satting greater than 99% on room air. White blood cell count 10.5, hemoglobin 4.6, platelets 52, sodium 134, potassium 5.4, creatinine 18.4, bicarbonate 9. Mag 2.5. CT renal study showsIncreasing moderate to severe right hydroureteronephrosis and severe left hydroureteronephrosis extending to the pelvis in the region of the patient's rectal suture  lines/anastomosis. 0.9 x 1.2 cm calculus at the left UPJ which is migrated from the left renal pelvis since the CT dated 02/24/2014. Multiple bilateral renal calculi are otherwise unchanged. Dr. Jeffie Pollock made aware of case per EDP. Would like pt to go to [facility] for further evaluation. I have asked EDP ([place]) to also make nephrology aware of case. Foley attempted multiple times in ER unsuccessfully. Pt now refusing foley placement until he is seen by urology.    Assessment and Plan:  [name] is a 63 y.o. year old male presenting with hematuria, AKI, nephrolithiasis, obstructive uropathy, anemia, hyperkalemia, metabolic acidosis    Active Problems:   Hematuria   AKI (acute kidney injury)   Obstructive uropathy   Anemia   Hyperkalemia   Nephrolithiasis   Metabolic acidosis   1- Obstructive uropathy/AKI/Nephrolithiasis/metabolic acidosis  -recurrent issue in setting of malignancy   -Dr. 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 Hematuria 04/28/2014  . Urinary retention 09/28/2013  . Gross hematuria 09/28/2013  . Acute blood loss anemia  09/27/2013  . GI bleeding 09/25/2013  . Hydronephrosis of left kidney 09/24/2013  . Hydronephrosis 09/22/2013  . Rectal bleeding 09/20/2013  . ARF (acute renal failure) 09/20/2013  . Secondary malignancy of right lung 07/09/2013  . Thrombocytopenia 05/01/2013  . Hx of radiation therapy   . Port catheter in place 09/04/2012  . B12 deficiency 12/07/2010  . Pancytopenia, acquired 12/04/2010  . DIABETES MELLITUS, TYPE II 07/07/2010  . HYPERCHOLESTEROLEMIA 07/07/2010  . Deficiency anemia 07/07/2010  . CORONARY ARTERY DISEASE 07/07/2010  . SLEEP APNEA 07/07/2010  . Adenocarcinoma of rectum 07/07/2010   Past Medical History: Past Medical History  Diagnosis Date  . Pancytopenia, acquired 12/04/2010  . B12 deficiency 12/07/2010  . Kidney stones   . CAD (coronary artery disease)   . Anemia   . History of radiation therapy 06/19/07 -07/27/07    whole pelvis  . Hx of radiation therapy 08/06/12,08/09/12,& 08/13/12    rul [id number]/23fx  . Cancer     Colon ca dx 2008 surg/rad/chemo  . Lung cancer   . FH: chemotherapy   . Myocardial infarction   . Rectal bleeding 09/20/2013    From recurrent rectal mass/recurrent rectal adenocarcinoma  . Acute blood loss anemia 09/27/2013  . 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  2.7 cm primary, moderately differentiated with 3/8 positive lymph nodes without LVI.  Surgery was on 04/22/2007 and was treated postoperatively with radiation and concomitant capecitabine and then 4 cycles of a planned 6 with oxaliplatin and capecitabine.  His counts would not allow treatment of  . Hydronephrosis of left kidney 09/24/2013    Kidney stones; s/p cystoscopy and stent  . Gross hematuria 09/25/2103  . Lung metastases 07/09/2013  . Secondary malignancy of right lung 07/09/2013    Past Surgical History: Past Surgical History  Procedure Laterality Date  . Ileostomy  12/07/2010     Procedure: ILEOSTOMY;  Surgeon: Donato Heinz;  Location: [facility] ORS;  Service: General;  Laterality: N/A;  Diverting Ileostomy Lysis of adhesions Exploratory Laparotomy  . Lung biopsy  10/27/10    rt lobe- adenocarcinoma  . Cholecystectomy    . Cataract extraction w/ intraocular lens implant  2007    bil  . Lithotripsy  2002  . Coronary angioplasty with stent placement  2003    stenting x 2  . Colon resection  2008    x2  . Esophagogastroduodenoscopy N/A 05/02/2013    Dr. Gala Romney- polypoid antral fold, bx= reactive gastropathy  . Givens capsule study N/A 05/02/2013    Procedure: GIVENS CAPSULE STUDY;  Surgeon: Daneil Dolin, MD;  Location: [facility] SUITE;  Service: Endoscopy;  Laterality: N/A;  . Colonoscopy N/A 09/20/2013    SLF:A near circumferential fungating mass with friable surfaces was found in the rectum.  ACTIVELY OOZING.  CLOTS SEEN IN LUMEN AND ASPIRATED.  Multiple biopsies were performed using cold forceps.  Thermal therapy(BICAP 7 Fr 25W) was used to ATTEMPT TO control bleeding.    HEMOSTASIS NOT ACHEIVED.  Marland Kitchen Cystoscopy w/ ureteral stent placement Left 09/24/2013    Procedure: CYSTOSCOPY WITH LEFT RETROGRADE PYELOGRAM; LEFT URETERAL STENT PLACEMENT; REMOVAL OF SMALL BLADDER CALCULI;  Surgeon: Marissa Nestle, MD;  Location: [facility]S;  Service: Urology;  Laterality: Left;  . Flexible  sigmoidoscopy N/A 09/25/2013    GEZ:MOQHUTMLY exophytic rectal mass most likely representing recurrent adenocarcinoma status post biopsy    Social History: History   Social History  . Marital Status: Married    Spouse Name: N/A    Number of Children: 4  . Years of Education: N/A   Social History Main Topics  . Smoking status: Former Smoker -- 2.00 packs/day for 30 years    Types: Cigarettes, Cigars  . Smokeless tobacco: Former Systems developer    Types: Chew  . Alcohol Use: No     Comment: last drink 08/2013  . Drug Use: No  . Sexual Activity: Not Currently   Other Topics Concern  . None   Social  History Narrative    Family History: Family History  Problem Relation Age of Onset  . Cervical cancer Mother   . Rectal cancer Father 25  . Uterine cancer Sister     Allergies: Allergies  Allergen Reactions  . Daypro [Oxaprozin] Nausea And Vomiting  . Xeloda [Capecitabine] Other (See Comments)    Homocidal and suicidal ideations    Current Facility-Administered Medications  Medication Dose Route Frequency Provider Last Rate Last Dose  . 0.9 %  sodium chloride infusion  10 mL/hr Intravenous Once Carmin Muskrat, MD      . 0.9 %  sodium chloride infusion   Intravenous Continuous Shanda Howells, MD      . cyanocobalamin ((VITAMIN B-12)) injection 1,000 mcg  1,000 mcg Intramuscular Q30 days Shanda Howells, MD      . folic acid (FOLVITE) tablet 1 mg  1 mg Oral Daily Shanda Howells, MD      . HYDROcodone-acetaminophen (NORCO/VICODIN) 5-325 MG per tablet 1-2 tablet  1-2 tablet Oral Q6H PRN Shanda Howells, MD      . hydrocortisone cream 1 % 1 application  1 application Topical BID Shanda Howells, MD      . HYDROmorphone (DILAUDID) injection 1 mg  1 mg Intravenous Once Carmin Muskrat, MD      . ondansetron [facility]) tablet 8 mg  8 mg Oral BID PRN Shanda Howells, MD      . pantoprazole (PROTONIX) EC tablet 40 mg  40 mg Oral Daily Shanda Howells, MD      . sodium chloride 0.9 % injection 3 mL  3 mL Intravenous Q12H Shanda Howells, MD      . sodium polystyrene (KAYEXALATE) 15 GM/60ML suspension 45 g  45 g Oral Once Shanda Howells, MD      . urea (CARMOL) 10 % cream   Topical PRN Shanda Howells, MD       Current Outpatient Prescriptions  Medication Sig Dispense Refill  . amoxicillin-clavulanate (AUGMENTIN) 875-125 MG per tablet Take 1 tablet by mouth every 12 (twelve) hours.    . clindamycin (CLEOCIN-T) 1 % lotion Apply topically 2 (two) times daily. 60 mL 1  . cyanocobalamin (,VITAMIN B-12,) 1000 MCG/ML injection Inject 1,000 mcg into the muscle every 30 (thirty) days.     . folic acid (FOLVITE) 1  MG tablet Take 1 tablet (1 mg total) by mouth daily. 30 tablet 5  . HYDROcodone-acetaminophen (NORCO/VICODIN) 5-325 MG per tablet Take 1-2 tablets by mouth every 6 (six) hours as needed. 75 tablet 0  . hydrocortisone cream 1 % Apply 1 application topically 2 (two) times daily. Mix with urea cream and apply to affected areas. 30 g 5  . hydrocortisone-urea (CARMOL-HC) 1-10 % cream Apply topically 3 (three) times daily. 85 g 6  . lidocaine-prilocaine (EMLA) cream  Apply a quarter size amount to port site 1 hour prior to chemo. Do not rub in. Cover with plastic wrap. 30 g 3  . omeprazole (PRILOSEC) 20 MG capsule Take 1 capsule (20 mg total) by mouth daily. 30 capsule 2  . ondansetron (ZOFRAN) 8 MG tablet Take 1 tablet (8 mg total) by mouth 2 (two) times daily as needed for nausea or vomiting. 30 tablet 1  . urea (CARMOL) 10 % cream Apply topically as needed. Mix with hydrocortisone cream and apply to affected areas. 71 g 5   Facility-Administered Medications Ordered in Other Encounters  Medication Dose Route Frequency Provider Last Rate Last Dose  . 0.9 %  sodium chloride infusion   Intravenous Continuous Pieter Partridge, MD 20 mL/hr at 07/17/12 1113    . heparin lock flush 100 unit/mL  500 Units Intravenous Once Pieter Partridge, MD      . sodium chloride 0.9 % injection 10 mL  10 mL Intravenous PRN Pieter Partridge, MD   10 mL at 07/17/12 1113   Review Of Systems: 12 point ROS negative except as noted above in HPI.  Physical Exam: Filed Vitals:   04/28/14 1945  BP: 121/58  Pulse: 84  Temp:   Resp: 12    General: alert and cooperative HEENT: PERRLA and extra ocular movement intact Heart: S1, S2 normal, no murmur, rub or gallop, regular rate and rhythm Lungs: clear to auscultation, no wheezes or rales and unlabored breathing Abdomen: abdomen is soft without significant tenderness, masses, organomegaly or guarding Extremities: + palpable purpura on dorsum of feet and hands bilaterally , 2+  peripheral pulses  Skin:as above  Neurology: normal without focal findings  Labs and Imaging: Lab Results  Component Value Date/Time   NA 134* 04/28/2014 08:51 AM   K 5.4* 04/28/2014 08:51 AM   CL 101 04/28/2014 08:51 AM   CO2 9* 04/28/2014 08:51 AM   BUN 113* 04/28/2014 08:51 AM   CREATININE 18.42* 04/28/2014 08:51 AM   GLUCOSE 140* 04/28/2014 08:51 AM   Lab Results  Component Value Date   WBC 10.5 04/28/2014   HGB 4.6* 04/28/2014   HCT 15.0* 04/28/2014   MCV 106.4* 04/28/2014   PLT 52* 04/28/2014    Ct Renal Stone Study  04/28/2014   CLINICAL DATA:  63 year old male with left-sided abdominal pelvic pain and hematuria. Renal stent came out 2 weeks ago. History of metastatic colon cancer.  EXAM: CT ABDOMEN AND PELVIS WITHOUT CONTRAST  TECHNIQUE: Multidetector CT imaging of the abdomen and pelvis was performed following the standard protocol without IV contrast.  COMPARISON:  04/21/2014 renal ultrasound.  02/24/2014 and prior CTs.  FINDINGS: Increasing moderate to severe right hydroureteronephrosis and severe left hydroureteronephrosis identified extending into the pelvis were there is unchanged soft tissue and a retractile process along the rectal suture line likely representing neoplasm causing urinary obstruction. Perirectal and precoccygeal/presacral stranding is unchanged.  There is high-density material within the bladder or representing blood or debris.  Multiple urinary calculi are identified including the following:  An 8 mm right lower pole calculus.  Several punctate right renal calculi.  A 10 x 15 mm calculus within the left renal pelvis.  A 1.2 x 2 cm calculus within the mid-lower left kidney.  A 0.9 x 1.2 cm calculus at the left UPJ which has migrated from the left renal pelvis since the prior CT. This calculus may be causing some degree of obstruction but the left ureter appears dilated distal to this  calculus.  The blood pool is very low density compatible with anemia.  The  liver is unchanged.  Mild splenomegaly again noted.  The pancreas and adrenal glands are unremarkable.  There is no evidence of free fluid, enlarged lymph nodes, Please note that parenchymal abnormalities may be missed without intravenous contrast.  The patient is status post cholecystectomy.  A moderate wide mouthed anterior right parastomal hernia containing small bowel loops are noted.  There is no evidence of bowel obstruction, pneumoperitoneum or definite abscess.  No acute or suspicious bony abnormality is are noted.  IMPRESSION: Increasing moderate to severe right hydroureteronephrosis and severe left hydroureteronephrosis extending to the pelvis in the region of the patient's rectal suture lines/anastomosis.  0.9 x 1.2 cm calculus at the left UPJ which is migrated from the left renal pelvis since the CT dated 02/24/2014. This calculus may be causing some degree of obstruction, but the left ureter is dilated distal to this calculus.  Multiple bilateral renal calculi are otherwise unchanged.  High-density material within the bladder -question blood versus debris.  Unchanged soft tissue and stranding region of the rectal anastomosis.  Unchanged mild splenomegaly and right pelvic peristomal hernia containing small bowel. No evidence of bowel obstruction.   Electronically Signed   By: Hassan Rowan M.D.   On: 04/28/2014 18:16           Shanda Howells MD  Pager: [phone]

## 2014-04-28 NOTE — ED Notes (Signed)
Pt with rash due to chemo, cbc checked hgb low

## 2014-04-28 NOTE — Progress Notes (Signed)
Tolerated transfusion without s/s adverse reaction. Left port accessed, flushed with saline. Patient transported to ED for admission to hospital. Report given to Billey Gosling.

## 2014-04-28 NOTE — ED Provider Notes (Signed)
CSN: [id number]     Arrival date & time 04/28/14  1346 History   This chart was scribed for Carmin Muskrat, MD by Stephania Fragmin, ED Scribe. This patient was seen in room APA02/APA02 and the patient's care was started at 3:11 PM.    Chief Complaint  Patient presents with  . Anemia    The history is provided by the patient. No language interpreter was used.     HPI Comments: [name] is a 63 y.o. male who was referred to the Emergency Department from within the hospital due to anemia. He complains of associated chronic, intermittent back pain possibly due to kidney stones that hurts more on his left side, blood and clots in his urine, as well as urinary frequency. He reports spontaneous passage of a ureteral stent, which was placed in April, within the last two weeks. Patient has a history of cancer that has gone into remission and returned repeatedly over the last 8 years. 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 He was supposed to have a course today but was unable to due to his current symptoms.  Patient denies fever, vomiting, confusion, and chest pain.  Staff at the oncology center attempted to admit the patient to 2 symptoms, but he was sent here for evaluation.   Past Medical History  Diagnosis Date  . Pancytopenia, acquired 12/04/2010  . B12 deficiency 12/07/2010  . Kidney stones   . CAD (coronary artery disease)   . Anemia   . History of radiation therapy 06/19/07 -07/27/07    whole pelvis  . Hx of radiation therapy 08/06/12,08/09/12,& 08/13/12    rul [id number]  . Cancer     Colon ca dx 2008 surg/rad/chemo  . Lung cancer   . FH: chemotherapy   . Myocardial infarction   . Rectal bleeding 09/20/2013    From recurrent rectal mass/recurrent rectal adenocarcinoma  . Acute blood loss anemia 09/27/2013  . 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  2.7 cm primary, moderately differentiated with 3/8 positive lymph nodes without LVI.  Surgery was on 04/22/2007 and was treated postoperatively with radiation and concomitant capecitabine and then 4 cycles of a planned 6 with oxaliplatin and capecitabine.  His counts would not allow treatment of  . Hydronephrosis of left kidney 09/24/2013    Kidney stones; s/p cystoscopy and stent  . Gross hematuria 09/25/2103  . Lung metastases 07/09/2013  . Secondary malignancy of right lung 07/09/2013   Past Surgical History  Procedure Laterality Date  . Ileostomy  12/07/2010    Procedure: ILEOSTOMY;  Surgeon: Donato Heinz;  Location: [facility];  Service: General;  Laterality: N/A;  Diverting Ileostomy Lysis of adhesions Exploratory Laparotomy  . Lung biopsy  10/27/10    rt lobe- adenocarcinoma  . Cholecystectomy    . Cataract extraction w/ intraocular lens implant  2007    bil  . Lithotripsy  2002  . Coronary angioplasty with stent placement  2003    stenting x 2  . Colon resection  2008    x2  . Esophagogastroduodenoscopy N/A 05/02/2013    Dr. Gala Romney- polypoid antral fold, bx= reactive gastropathy  . Givens capsule study N/A 05/02/2013    Procedure: GIVENS CAPSULE STUDY;  Surgeon: Daneil Dolin, MD;  Location: [facility];  Service: Endoscopy;  Laterality: N/A;  . Colonoscopy N/A 09/20/2013    SLF:A near circumferential fungating mass with friable surfaces was found in the rectum.  ACTIVELY OOZING.  CLOTS SEEN IN LUMEN AND ASPIRATED.  Multiple biopsies were performed using cold forceps.  Thermal therapy(BICAP 7 Fr 25W) was used to ATTEMPT TO control bleeding.    HEMOSTASIS NOT ACHEIVED.  Marland Kitchen Cystoscopy w/ ureteral stent placement Left 09/24/2013    Procedure: CYSTOSCOPY WITH LEFT RETROGRADE PYELOGRAM; LEFT URETERAL STENT PLACEMENT; REMOVAL OF SMALL BLADDER CALCULI;  Surgeon: Marissa Nestle, MD;  Location: [facility];  Service: Urology;  Laterality: Left;  .  Flexible sigmoidoscopy N/A 09/25/2013    RUE:[id number] exophytic rectal mass most likely representing recurrent adenocarcinoma status post biopsy   Family History  Problem Relation Age of Onset  . Cervical cancer Mother   . Rectal cancer Father 79  . Uterine cancer Sister    History  Substance Use Topics  . Smoking status: Former Smoker -- 2.00 packs/day for 30 years    Types: Cigarettes, Cigars  . Smokeless tobacco: Former Systems developer    Types: Chew  . Alcohol Use: No     Comment: last drink 08/2013    Review of Systems  Constitutional: Negative for fever.       Per HPI, otherwise negative  HENT:       Per HPI, otherwise negative  Respiratory:       Per HPI, otherwise negative  Cardiovascular: Negative for chest pain.       Per HPI, otherwise negative  Gastrointestinal: Negative for vomiting.  Endocrine:       Negative aside from HPI  Genitourinary: Positive for frequency and hematuria.       Neg aside from HPI   Musculoskeletal:       Per HPI, otherwise negative  Skin: Negative.   Neurological: Negative for syncope.  Psychiatric/Behavioral: Negative for confusion.  All other systems reviewed and are negative.     Allergies  Daypro and Xeloda  Home Medications   Prior to Admission medications   Medication Sig Start Date End Date Taking? Authorizing Provider  amoxicillin-clavulanate (AUGMENTIN) 875-125 MG per tablet Take 1 tablet by mouth every 12 (twelve) hours. 04/18/14  Yes Historical Provider, MD  clindamycin (CLEOCIN-T) 1 % lotion Apply topically 2 (two) times daily. 04/07/14  Yes Manon Hilding Kefalas, PA-C  cyanocobalamin (,VITAMIN B-12,) 1000 MCG/ML injection Inject 1,000 mcg into the muscle every 30 (thirty) days.    Yes Historical Provider, MD  folic acid (FOLVITE) 1 MG tablet Take 1 tablet (1 mg total) by mouth daily. 04/28/14  Yes Baird Cancer, PA-C  HYDROcodone-acetaminophen (NORCO/VICODIN) 5-325 MG per tablet Take 1-2 tablets by mouth every 6 (six) hours as  needed. 04/14/14  Yes Manon Hilding Kefalas, PA-C  hydrocortisone cream 1 % Apply 1 application topically 2 (two) times daily. Mix with urea cream and apply to affected areas. 04/23/14  Yes Manon Hilding Kefalas, PA-C  hydrocortisone-urea (CARMOL-HC) 1-10 % cream Apply topically 3 (three) times daily. 04/22/14  Yes Farrel Gobble, MD  lidocaine-prilocaine (EMLA) cream Apply a quarter size amount to port site 1 hour prior to chemo. Do not rub in. Cover with plastic wrap. 03/13/14  Yes Baird Cancer, PA-C  omeprazole (PRILOSEC) 20 MG capsule Take 1 capsule (20 mg total) by mouth daily. 09/28/13  Yes Rexene Alberts, MD  ondansetron (ZOFRAN) 8 MG tablet Take 1 tablet (8 mg total) by mouth 2 (two) times daily as needed for nausea  or vomiting. 03/13/14  Yes Manon Hilding Kefalas, PA-C  urea (CARMOL) 10 % cream Apply topically as needed. Mix with hydrocortisone cream and apply to affected areas. 04/23/14  Yes Thomas S Kefalas, PA-C   BP 121/58 mmHg  Pulse 84  Temp(Src) 97.5 F (36.4 C) (Oral)  Resp 12  Ht 5\' 10"  (1.778 m)  Wt 218 lb (98.884 kg)  BMI 31.28 kg/m2  SpO2 100% Physical Exam  Constitutional: He is oriented to person, place, and time. He appears well-developed. No distress.  HENT:  Head: Normocephalic and atraumatic.  Eyes: Conjunctivae and EOM are normal.  Cardiovascular: Normal rate, regular rhythm and intact distal pulses.   Pulmonary/Chest: Effort normal. No stridor. No respiratory distress.  Port in place, c/d/i  Abdominal: Soft. He exhibits no distension. There is no tenderness.  Right sided colostomy bag filled with stool.  Musculoskeletal: He exhibits edema.  Symmetric distal edema  Neurological: He is alert and oriented to person, place, and time.  Skin: Skin is warm and dry.  Petecheaic ecchymotic rash on feet, arms , legs.  No bleeding or discharge.  Psychiatric: He has a normal mood and affect.  Nursing note and vitals reviewed.   ED Course  Procedures    3:17 PM -  Discussed treatment plan with pt at bedside and pt agreed to plan.   After the initial evaluation I reviewed the patient's chart from multiple prior visits, and today's oncology visit. Patient's labs notable for worsening anemia, with hemoglobin less than 5. Patient had already received one unit of blood prior to ED arrival. Scheduled patient for 2 additional units of blood.  Patient labs also notable for substantial increase in creatinine, from 12 1 week ago to 18 today. Patient received IV fluid resuscitation due to the acute kidney failure/injury.  With concerns for obstruction, three attempts were made by ED staff to place a catheter.  These were unsuccessful.  After my initial eval I discussed his case with urology (Dr. Jeffie Pollock) Given acute renal injury, with concern for obstruction he recommends transfer.  The patient is aware of this recommendation  On repeat exam the patient remains in similar condition. Patient now defers my recommendation for additional attempt at catheter placement.  DIAGNOSTIC STUDIES: Oxygen Saturation is 100% on room air, normal by my interpretation.    Cardiac monitor, 80 sr, wnl  No peaked t waves  8:43 PM Patient has received 1 L of blood, well tolerated, and is now being started on a second liter of blood.  I discussed patient's case with our hospitalist colleagues. Patient will be transferred to our affiliated facility given his acute kidney injury, progressive anemia, concern for GU obstruction.   Labs Review Labs Reviewed  URINALYSIS, ROUTINE W REFLEX MICROSCOPIC  COMPREHENSIVE METABOLIC PANEL  COMPREHENSIVE METABOLIC PANEL  CBC WITH DIFFERENTIAL  CBC  LACTIC ACID, PLASMA  TYPE AND SCREEN  PREPARE RBC (CROSSMATCH)    Imaging Review Ct Renal Stone Study  04/28/2014   CLINICAL DATA:  63 year old male with left-sided abdominal pelvic pain and hematuria. Renal stent came out 2 weeks ago. History of metastatic colon cancer.  EXAM: CT ABDOMEN  AND PELVIS WITHOUT CONTRAST  TECHNIQUE: Multidetector CT imaging of the abdomen and pelvis was performed following the standard protocol without IV contrast.  COMPARISON:  04/21/2014 renal ultrasound.  02/24/2014 and prior CTs.  FINDINGS: Increasing moderate to severe right hydroureteronephrosis and severe left hydroureteronephrosis identified extending into the pelvis were there is unchanged soft tissue and a retractile process  along the rectal suture line likely representing neoplasm causing urinary obstruction. Perirectal and precoccygeal/presacral stranding is unchanged.  There is high-density material within the bladder or representing blood or debris.  Multiple urinary calculi are identified including the following:  An 8 mm right lower pole calculus.  Several punctate right renal calculi.  A 10 x 15 mm calculus within the left renal pelvis.  A 1.2 x 2 cm calculus within the mid-lower left kidney.  A 0.9 x 1.2 cm calculus at the left UPJ which has migrated from the left renal pelvis since the prior CT. This calculus may be causing some degree of obstruction but the left ureter appears dilated distal to this calculus.  The blood pool is very low density compatible with anemia.  The liver is unchanged.  Mild splenomegaly again noted.  The pancreas and adrenal glands are unremarkable.  There is no evidence of free fluid, enlarged lymph nodes, Please note that parenchymal abnormalities may be missed without intravenous contrast.  The patient is status post cholecystectomy.  A moderate wide mouthed anterior right parastomal hernia containing small bowel loops are noted.  There is no evidence of bowel obstruction, pneumoperitoneum or definite abscess.  No acute or suspicious bony abnormality is are noted.  IMPRESSION: Increasing moderate to severe right hydroureteronephrosis and severe left hydroureteronephrosis extending to the pelvis in the region of the patient's rectal suture lines/anastomosis.  0.9 x 1.2 cm  calculus at the left UPJ which is migrated from the left renal pelvis since the CT dated 02/24/2014. This calculus may be causing some degree of obstruction, but the left ureter is dilated distal to this calculus.  Multiple bilateral renal calculi are otherwise unchanged.  High-density material within the bladder -question blood versus debris.  Unchanged soft tissue and stranding region of the rectal anastomosis.  Unchanged mild splenomegaly and right pelvic peristomal hernia containing small bowel. No evidence of bowel obstruction.   Electronically Signed   By: Hassan Rowan M.D.   On: 04/28/2014 18:16      MDM   Final diagnoses:  Abdominal pain   acute kidney failure Anemia requiring transfusion  This patient with MMP, including metastatic cancer, colostomy bag, ongoing chemotherapy, now presents with urinary decrease, hematuria, and mom labs performed just prior to ED arrival was found to have worsening anemia as well as acute kidney injury. Patient received fluid resuscitation with IV fluids, blood. Patient's vital signs remained consistent. Patient had evidence for possible obstruction of the GU system, requiring transfer for further evaluation and management. Patient also had more severe anemia, requiring transfusions, as well as an acute kidney injury.  I personally performed the services described in this documentation, which was scribed in my presence. The recorded information has been reviewed and is accurate.  CRITICAL CARE Performed by: Carmin Muskrat Total critical care time: 45 Critical care time was exclusive of separately billable procedures and treating other patients. Critical care was necessary to treat or prevent imminent or life-threatening deterioration. Critical care was time spent personally by me on the following activities: development of treatment plan with patient and/or surrogate as well as nursing, discussions with consultants, evaluation of patient's response to  treatment, examination of patient, obtaining history from patient or surrogate, ordering and performing treatments and interventions, ordering and review of laboratory studies, ordering and review of radiographic studies, pulse oximetry and re-evaluation of patient's condition.    Carmin Muskrat, MD 04/28/14 2046

## 2014-04-28 NOTE — ED Notes (Signed)
Pt sent here from [facility] for admission, reported that pt's hbg low and has received one unit of blood and to receive another unit here in ED, no orders received at present, pt with rash that burns, left flank pain-pt with hx of multiple kidney stones

## 2014-04-28 NOTE — ED Notes (Signed)
CRITICAL VALUE ALERT  Critical value received:  Hgb 6.4; platelets 36,000  Date of notification:  04/28/14  Time of notification:  2051  Critical value read back:  Yes  Nurse who received alert:  Kayleen Memos, RN  Post blood transfusion

## 2014-04-28 NOTE — Progress Notes (Signed)
CRITICAL VALUE ALERT Critical value received:  Hgb 4.6 Date of notification:  04/28/2014  Time of notification: 1000 Critical value read back:  Yes.   Nurse who received alert:  Jacqulynn Shappell, Hermenia Fiscal, RN MD notified (1st page):  Caroleen Hamman, PA-C @ [phone]  04/28/2014 1015  T. Kefalas, PA-C in to speak with Dr. Barnet Glasgow; awaiting orders.

## 2014-04-28 NOTE — Progress Notes (Addendum)
[name] is seen as a work-in today.  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  Cetuximab is being tolerated well thus far except for palmar-plantar erythrodysethesia secondary to Cetuximab and resolved acne rash of face and chest treated with Cleocin secondary to treatment.  He is seen brought in by wheelchair due to plantar pain.  Today he is brought in and I stopped him in the hallway to find out why he was in a wheelchair and not ambulating on his own.  He reports that his feet are sore and the "rash" is better today, but it was worse last week after being seen in the office.  He is escorted to a chemotherapy bed and I saw him there for an exam.  His exam is impressive for a palpable purpura of lower extremities bilaterally with purpura of dorsal aspect of feet bilaterally with extension of purpura on lower legs extending superiorly past patella with satellite lesions.  There is minimal serosanguinous drainage posteriorly of left lower leg.  From an examination standpoint, this is the only physical abnormality noted.   Further abnormalities are noted on lab results:  CBC    Component Value Date/Time   WBC 10.5 04/28/2014 0851   RBC 1.41* 04/28/2014 0851   HGB 4.6* 04/28/2014 0851   HCT 15.0* 04/28/2014 0851   PLT 52* 04/28/2014 0851   MCV 106.4* 04/28/2014 0851   MCH 32.6 04/28/2014 0851   MCHC 30.7 04/28/2014 0851   RDW 24.4* 04/28/2014 0851   LYMPHSABS 0.4* 04/28/2014 0851   MONOABS 0.5 04/28/2014 0851   EOSABS 0.1 04/28/2014 0851   BASOSABS 0.0 04/28/2014 0851      Chemistry      Component Value Date/Time   NA 134* 04/28/2014 0851   K 5.4* 04/28/2014 0851   CL 101 04/28/2014 0851   CO2 9* 04/28/2014 0851   BUN 113* 04/28/2014 0851   CREATININE 18.42* 04/28/2014 0851      Component Value Date/Time   CALCIUM 7.9* 04/28/2014 0851   ALKPHOS 76  04/28/2014 0851   AST 39* 04/28/2014 0851   ALT 34 04/28/2014 0851   BILITOT 0.6 04/28/2014 0851     This man has a continued acute anemia requiring more blood products.  In the past 2 weeks he has needed 6 units of PRBCs for his anemia without a significant increase in Hgb.  His platelet count is 52,000 and this is about his baseline secondary to cirrhosis of liver. Additionally, the patient creatinine has increased over the past 2 weeks.  His baseline is about 1.7, but on 11/16 his creatinine increased to 7.1, then 12.47 on 11/23, and now today, 11/30, his creatinine is 18.42.  This was previously thought to be an obstructive nephropathy given his history of passing renal stones over the past 2 weeks and ureter stent last week.  GU blood loss is thought to be the cause of his acute anemia and the patient reports that he continues to pass gross blood clots. Also, he is minimally hyperkalemic and his BUN is up to 113.  His serum CO2 is low at 9 and his anion gap is elevated.  With this continued and acute renal function change, he needs hospitalization for consideration and dialysis and nephrology and urologic work-up.  He has an appointment with Dr. Jeffie Pollock here in [place] tomorrow, 04/29/2014, but he needs to be  in the hospital.   Other historical events includes IV Feraheme 510 mg on 11/16 and 04/21/2014 for iron deficiency.  He is currently day 15 of cycle 2 of single-agent Cetuximab with nice biochemical response to date.   Assessment: 1. Palpable purpura- Henoch- Schonlein purpura.  Platelet count is at baseline 2. Renal failure, etiology unclear, ?obstructive nephropathy, Henoch-Schonlein purpura-induced, etc? 3. Hypercalcemia, secondary to #2 4. Low serum CO2 5. Elevated Anion Gap 6. Acute blood loss anemia, receiving 2 units of PRBCs today (04/28/2014).  S/P 6 units of PRBCs in 2 weeks. 7. Thrombocytopenia, at baseline, secondary to cirrhosis of liver 8. GU blood loss 9. Palmar-Plantar  erythrodysethesia, on urea cream with patient reported improvement. 10. 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    Adenocarcinoma of rectum   07/07/2010 Initial Diagnosis Adenocarcinoma of rectum   02/24/2014 Progression CT CAP-  Findings within the chest are worrisome for recurrent tumor with bilateral pulmonary metastasis.   03/17/2014 -  Chemotherapy Erbitux single agent   Plan: 1. I called [facility] for direct admission to [facility], as patient is clinically stable.  Hospitalist deferred to [facility] ED for admission. 2. I spoke with Dr. Roderic Palau, ED physician, and he recommended calling patient's urologist, Dr. Jeffie Pollock or associates, to see if they would rather the patient be admitted to [facility] or [facility]. 3. Call placed to Dr. Jeffie Pollock and I spoke with his nurse.  Awaiting return telephone call. 4. 2 unit PRBCs today.  Underway presently at time of this dictation. 5. Supportive care of purpura only with hydration and pain control if needed. Avoid anticoagulation given chronic thrombocytopenia. 6. Patient will be admitted to a hospital today.  Addendum: Dr. Jeffie Pollock was gracious enough to call me back in person.  He recommends the patient be admitted to [facility] with a 61 French Foley catheter (8 [place] if 22 [place] cannot be placed).  He suspects this is secondary to retention.  He also recommends a CT abd/pelvis without contrast to evaluate for intrinsic and extrinsic obstructive nephropathy.  If unable to place foley of recommended size, a transfer to [place] would be appropriate.  Urology will be notified, Dr. Beatrix Fetters, and they should be consulted at [facility] as urology performs consults on Tuesdays and Friday at [facility]  Nephrology should also be consulted as well in the event that renal function does not improve, dialysis may be needed. I cannot place the inpatient order for CT  abd/pelvis wo contrast at this time because he is not in [facility] as an inpatient and I will need to defer to ED physician or hospitalist.  Percutaneous nephrostomy may be needed in future.  Patient and plan discussed with Dr. Farrel Gobble and he is in agreement with the aforementioned.   KEFALAS,THOMAS 04/28/2014

## 2014-04-28 NOTE — Patient Instructions (Addendum)
[facility] Discharge Instructions  RECOMMENDATIONS MADE BY THE CONSULTANT AND ANY TEST RESULTS WILL BE SENT TO YOUR REFERRING PHYSICIAN.  EXAM FINDINGS BY THE PHYSICIAN TODAY AND SIGNS OR SYMPTOMS TO REPORT TO CLINIC OR PRIMARY PHYSICIAN: Exam and findings as discussed by Meriel Flavors.  MEDICATIONS PRESCRIBED:  HOLD Erbitux today. Continue to use prescription cream to areas on feet,legs and hands.  INSTRUCTIONS/FOLLOW-UP: The areas to your skin are a result of the Erbitux. Other things to watch for are decreased urine output, abdominal bloating and vomiting. ADMIT TO HOSPITAL TODAY   Thank you for choosing [facility] to provide your oncology and hematology care.  To afford each patient quality time with our providers, please arrive at least 15 minutes before your scheduled appointment time.  With your help, our goal is to use those 15 minutes to complete the necessary work-up to ensure our physicians have the information they need to help with your evaluation and healthcare recommendations.    Effective January 1st, 2014, we ask that you re-schedule your appointment with our physicians should you arrive 10 or more minutes late for your appointment.  We strive to give you quality time with our providers, and arriving late affects you and other patients whose appointments are after yours.    Again, thank you for choosing [facility].  Our hope is that these requests will decrease the amount of time that you wait before being seen by our physicians.       _____________________________________________________________  Should you have questions after your visit to [facility], please contact our office at [phone] between the hours of 8:30 a.m. and 4:30 p.m.  Voicemails left after 4:30 p.m. will not be returned until the following business day.  For prescription refill requests, have your pharmacy contact our office with your  prescription refill request.    _______________________________________________________________  We hope that we have given you very good care.  You may receive a patient satisfaction survey in the mail, please complete it and return it as soon as possible.  We value your feedback!  _______________________________________________________________  Have you asked about our STAR program?  STAR stands for Survivorship Training and Rehabilitation, and this is a nationally recognized cancer care program that focuses on survivorship and rehabilitation.  Cancer and cancer treatments may cause problems, such as, pain, making you feel tired and keeping you from doing the things that you need or want to do. Cancer rehabilitation can help. Our goal is to reduce these troubling effects and help you have the best quality of life possible.  You may receive a survey from a nurse that asks questions about your current state of health.  Based on the survey results, all eligible patients will be referred to the [facility] program for an evaluation so we can better serve you!  A frequently asked questions sheet is available upon request.

## 2014-04-28 NOTE — ED Notes (Signed)
MD at bedside. 

## 2014-04-28 NOTE — ED Notes (Signed)
Unable to insert foley attempted x 3 with 16 F, 42 F and 16 F coude without success, EDP  Made aware

## 2014-04-28 NOTE — Consult Note (Signed)
Subjective: I was asked to see Mr. Bendorf by Robynn Pane PA for retention and difficult foley placement.   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  He was diagnosed 8 years ago.  He has been on Irbetuxin most recent.  He has a long history of stones and had a stent placed on the left in April by Dr. [name] and he actually passed the stent about 2 weeks ago.  He has had increase left flank pain since he passed the stent and has developed gross hematuria with clot retention and last voided some about 2 days ago.  He was found to have a creatinine of 18 today and a renal US showed bilateral hydro and a distended bladder with clots at the base.   He had a CT today that shows an obstructing left UPJ stone and a large left renal stone.   He isn't having much suprapubic pain.  He had a recurrent rectal mass on sigmoidoscopy in April but no mention of cancer in the bladder on cystoscopy at that time.   He had pelvic radiation in the past.  ROS:  Review of Systems  Constitutional: Positive for chills. Negative for fever and weight loss.  Respiratory: Positive for shortness of breath (with effort). Negative for cough.   Cardiovascular: Positive for leg swelling. Negative for chest pain.  Gastrointestinal: Positive for abdominal pain and blood in stool (occasionally from his mucous fistula). Negative for nausea and vomiting.  Genitourinary: Positive for hematuria and flank pain.       Difficulty urinating  Musculoskeletal: Positive for joint pain.  Skin: Positive for rash (from the chemo with hand foot syndrome).  Neurological: Positive for tingling (post chemo) and weakness.  Endo/Heme/Allergies: Bruises/bleeds easily.  All other systems reviewed and are negative.  Allergies  Allergen Reactions  . Daypro [Oxaprozin] Nausea And Vomiting  . Xeloda [Capecitabine] Other (See Comments)    Homocidal and suicidal ideations    Past Medical History  Diagnosis Date  .  Pancytopenia, acquired 12/04/2010  . B12 deficiency 12/07/2010  . Kidney stones   . CAD (coronary artery disease)   . Anemia   . History of radiation therapy 06/19/07 -07/27/07    whole pelvis  . Hx of radiation therapy 08/06/12,08/09/12,& 08/13/12    rul 54GY/66fx  . Cancer     Colon ca dx 2008 surg/rad/chemo  . Lung cancer   . FH: chemotherapy   . Myocardial infarction   . Rectal bleeding 09/20/2013    From recurrent rectal mass/recurrent rectal adenocarcinoma  . Acute blood loss anemia 09/27/2013  . 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  2.7 cm primary, moderately differentiated with 3/8 positive lymph nodes without LVI.  Surgery was on 04/22/2007 and was treated postoperatively with radiation and concomitant capecitabine and then 4 cycles of a planned 6 with oxaliplatin and capecitabine.  His counts would not allow treatment of  . Hydronephrosis of left kidney 09/24/2013    Kidney stones; s/p cystoscopy and stent  . Gross hematuria 09/25/2103  . Lung metastases 07/09/2013  . Secondary malignancy of right lung 07/09/2013    Past Surgical History  Procedure Laterality Date  . Ileostomy  12/07/2010    Procedure: ILEOSTOMY;  Surgeon: Donato Heinz;  Location: [facility] ORS;  Service: General;  Laterality: N/A;  Diverting Ileostomy Lysis of adhesions  Exploratory Laparotomy  . Lung biopsy  10/27/10    rt lobe- adenocarcinoma  . Cholecystectomy    . Cataract extraction w/ intraocular lens implant  2007    bil  . Lithotripsy  2002  . Coronary angioplasty with stent placement  2003    stenting x 2  . Colon resection  2008    x2  . Esophagogastroduodenoscopy N/A 05/02/2013    Dr. Gala Romney- polypoid antral fold, bx= reactive gastropathy  . Givens capsule study N/A 05/02/2013    Procedure: GIVENS CAPSULE STUDY;  Surgeon: Daneil Dolin, MD;  Location: [facility] ENDO SUITE;  Service: Endoscopy;  Laterality: N/A;  .  Colonoscopy N/A 09/20/2013    SLF:A near circumferential fungating mass with friable surfaces was found in the rectum.  ACTIVELY OOZING.  CLOTS SEEN IN LUMEN AND ASPIRATED.  Multiple biopsies were performed using cold forceps.  Thermal therapy(BICAP 7 Fr 25W) was used to ATTEMPT TO control bleeding.    HEMOSTASIS NOT ACHEIVED.  Marland Kitchen Cystoscopy w/ ureteral stent placement Left 09/24/2013    Procedure: CYSTOSCOPY WITH LEFT RETROGRADE PYELOGRAM; LEFT URETERAL STENT PLACEMENT; REMOVAL OF SMALL BLADDER CALCULI;  Surgeon: Marissa Nestle, MD;  Location: [facility] ORS;  Service: Urology;  Laterality: Left;  . Flexible sigmoidoscopy N/A 09/25/2013    STM:[id number] exophytic rectal mass most likely representing recurrent adenocarcinoma status post biopsy    History   Social History  . Marital Status: Married    Spouse Name: N/A    Number of Children: 4  . Years of Education: N/A   Occupational History  . Not on file.   Social History Main Topics  . Smoking status: Former Smoker -- 2.00 packs/day for 30 years    Types: Cigarettes, Cigars  . Smokeless tobacco: Former Systems developer    Types: Chew  . Alcohol Use: No     Comment: last drink 08/2013  . Drug Use: No  . Sexual Activity: Not Currently   Other Topics Concern  . Not on file   Social History Narrative    Family History  Problem Relation Age of Onset  . Cervical cancer Mother   . Rectal cancer Father 59  . Uterine cancer Sister     Anti-infectives: Anti-infectives    None      Current Facility-Administered Medications  Medication Dose Route Frequency Provider Last Rate Last Dose  . 0.9 %  sodium chloride infusion  10 mL/hr Intravenous Once Carmin Muskrat, MD      . 0.9 %  sodium chloride infusion   Intravenous Continuous Shanda Howells, MD      . Derrill Memo ON 04/29/2014] cyanocobalamin ((VITAMIN B-12)) injection 1,000 mcg  1,000 mcg Intramuscular Q30 days Shanda Howells, MD      . folic acid (FOLVITE) tablet 1 mg  1 mg Oral Daily Shanda Howells,  MD      . HYDROcodone-acetaminophen (NORCO/VICODIN) 5-325 MG per tablet 1-2 tablet  1-2 tablet Oral Q6H PRN Shanda Howells, MD      . hydrocortisone cream 1 % 1 application  1 application Topical BID Shanda Howells, MD      . hydrocortisone cream 1 %   Topical TID PRN Carmin Muskrat, MD      . ondansetron [facility]) tablet 8 mg  8 mg Oral BID PRN Shanda Howells, MD      . pantoprazole (PROTONIX) EC tablet 40 mg  40 mg Oral Daily Shanda Howells, MD      . sodium chloride 0.9 % injection 3 mL  3  mL Intravenous Q12H Shanda Howells, MD      . sodium polystyrene (KAYEXALATE) 15 GM/60ML suspension 45 g  45 g Oral Once Shanda Howells, MD       Facility-Administered Medications Ordered in Other Encounters  Medication Dose Route Frequency Provider Last Rate Last Dose  . 0.9 %  sodium chloride infusion   Intravenous Continuous Pieter Partridge, MD 20 mL/hr at 07/17/12 1113    . heparin lock flush 100 unit/mL  500 Units Intravenous Once Pieter Partridge, MD      . sodium chloride 0.9 % injection 10 mL  10 mL Intravenous PRN Pieter Partridge, MD   10 mL at 07/17/12 1113   PSFH reviewed.   Objective: Vital signs in last 24 hours: Temp:  [97.2 F (36.2 C)-97.8 F (36.6 C)] 97.8 F (36.6 C) (11/30 2250) Pulse Rate:  [75-102] 91 (11/30 2250) Resp:  [12-23] 20 (11/30 2250) BP: (117-149)/(54-74) 140/62 mmHg (11/30 2250) SpO2:  [99 %-100 %] 100 % (11/30 2250) Weight:  [98.884 kg (218 lb)-99.791 kg (220 lb)] 99.5 kg (219 lb 5.7 oz) (11/30 2258)  Intake/Output from previous day:   Intake/Output this shift: Total I/O In: 679.1 [Blood:679.1] Out: 50 [Stool:50]   Physical Exam  Constitutional: He is oriented to person, place, and time and well-developed, well-nourished, and in no distress.  HENT:  Head: Normocephalic and atraumatic.  Neck: Normal range of motion. Neck supple. No thyromegaly present.  Cardiovascular: Normal rate and regular rhythm.   Pulmonary/Chest: Effort normal and breath sounds  normal.  Abdominal: Soft. Bowel sounds are normal. He exhibits no distension and no mass.  He has a colostomy in the RLQ that is healthy.   He has a well healed midline wound.  He his suprapubic area is firm but non-tender.   He has no CVAT.  No hernias are noted.   Genitourinary: Penis normal.  Scrotum and contents are unremarkable.  He has severe anal stenosis and a prostate exam is not possible.   He has a normal phallus with an adequate meatus  Musculoskeletal: Normal range of motion. He exhibits edema. He exhibits no tenderness.  Neurological: He is alert and oriented to person, place, and time.  Skin: Skin is warm and dry. Rash noted.  Psychiatric: Mood and affect normal.  Vitals reviewed.   Lab Results:   Recent Labs  04/28/14 0851 04/28/14 2034  WBC 10.5 9.8  HGB 4.6* 6.4*  HCT 15.0* 19.3*  PLT 52* 36*   BMET  Recent Labs  04/28/14 0851  NA 134*  K 5.4*  CL 101  CO2 9*  GLUCOSE 140*  BUN 113*  CREATININE 18.42*  CALCIUM 7.9*   PT/INR No results for input(s): LABPROT, INR in the last 72 hours. ABG No results for input(s): PHART, HCO3 in the last 72 hours.  Invalid input(s): PCO2, PO2  Studies/Results: Ct Renal Stone Study  04/28/2014   CLINICAL DATA:  63 year old male with left-sided abdominal pelvic pain and hematuria. Renal stent came out 2 weeks ago. History of metastatic colon cancer.  EXAM: CT ABDOMEN AND PELVIS WITHOUT CONTRAST  TECHNIQUE: Multidetector CT imaging of the abdomen and pelvis was performed following the standard protocol without IV contrast.  COMPARISON:  04/21/2014 renal ultrasound.  02/24/2014 and prior CTs.  FINDINGS: Increasing moderate to severe right hydroureteronephrosis and severe left hydroureteronephrosis identified extending into the pelvis were there is unchanged soft tissue and a retractile process along the rectal suture line likely representing neoplasm causing urinary obstruction.  Perirectal and precoccygeal/presacral stranding  is unchanged.  There is high-density material within the bladder or representing blood or debris.  Multiple urinary calculi are identified including the following:  An 8 mm right lower pole calculus.  Several punctate right renal calculi.  A 10 x 15 mm calculus within the left renal pelvis.  A 1.2 x 2 cm calculus within the mid-lower left kidney.  A 0.9 x 1.2 cm calculus at the left UPJ which has migrated from the left renal pelvis since the prior CT. This calculus may be causing some degree of obstruction but the left ureter appears dilated distal to this calculus.  The blood pool is very low density compatible with anemia.  The liver is unchanged.  Mild splenomegaly again noted.  The pancreas and adrenal glands are unremarkable.  There is no evidence of free fluid, enlarged lymph nodes, Please note that parenchymal abnormalities may be missed without intravenous contrast.  The patient is status post cholecystectomy.  A moderate wide mouthed anterior right parastomal hernia containing small bowel loops are noted.  There is no evidence of bowel obstruction, pneumoperitoneum or definite abscess.  No acute or suspicious bony abnormality is are noted.  IMPRESSION: Increasing moderate to severe right hydroureteronephrosis and severe left hydroureteronephrosis extending to the pelvis in the region of the patient's rectal suture lines/anastomosis.  0.9 x 1.2 cm calculus at the left UPJ which is migrated from the left renal pelvis since the CT dated 02/24/2014. This calculus may be causing some degree of obstruction, but the left ureter is dilated distal to this calculus.  Multiple bilateral renal calculi are otherwise unchanged.  High-density material within the bladder -question blood versus debris.  Unchanged soft tissue and stranding region of the rectal anastomosis.  Unchanged mild splenomegaly and right pelvic peristomal hernia containing small bowel. No evidence of bowel obstruction.   Electronically Signed   By:  Hassan Rowan M.D.   On: 04/28/2014 18:16   I have reviewed his labs, CT films and [place] films.  I discussed the case with Robynn Pane.  A 3fr coude foley was placed after a betadine prep and urethral lubrication with lidocaine jelly.  There was minimal resistance and the foley drained 500+ml of bloody urine.     The bladder was irrigated with NS with return of about 133ml of clots.  Assessment: Clot retention that is causing bilateral hydro and a 1.2cm left UPJ stone that is also contributing to the left hydro with ARI.  He also has a 2cm left renal stone.   He has thrombocytopenia. The hematuria is possibly from radiation cystitis or possible bladder invasion from the rectal cancer.   Plan: Leave foley to SD and monitor Cr.  He will need a left percutaneous nephrostomy tube with subsequent percutaneous nephrolithotomy when he has recovered from the acute episode.  I will order the left perc but he may need a platelet transfusion if requested by IR.  He will also need cystoscopy at some point to assess the bleeding source, but I don't think another left ureteral stent is indicated at this point unless he is unable to have a perc.   CC: Robynn Pane PA    LOS: 0 days    Malka So 04/28/2014

## 2014-04-29 ENCOUNTER — Ambulatory Visit: Payer: Managed Care, Other (non HMO) | Admitting: Urology

## 2014-04-29 DIAGNOSIS — N179 Acute kidney failure, unspecified: Secondary | ICD-10-CM

## 2014-04-29 DIAGNOSIS — N139 Obstructive and reflux uropathy, unspecified: Principal | ICD-10-CM

## 2014-04-29 DIAGNOSIS — R319 Hematuria, unspecified: Secondary | ICD-10-CM

## 2014-04-29 DIAGNOSIS — E875 Hyperkalemia: Secondary | ICD-10-CM

## 2014-04-29 LAB — CBC WITH DIFFERENTIAL/PLATELET
Basophils Absolute: 0 10*3/uL (ref 0.0–0.1)
Basophils Relative: 0 % (ref 0–1)
EOS PCT: 1 % (ref 0–5)
Eosinophils Absolute: 0.1 10*3/uL (ref 0.0–0.7)
HEMATOCRIT: 18.7 % — AB (ref 39.0–52.0)
Hemoglobin: 6.1 g/dL — CL (ref 13.0–17.0)
LYMPHS ABS: 0.3 10*3/uL — AB (ref 0.7–4.0)
Lymphocytes Relative: 4 % — ABNORMAL LOW (ref 12–46)
MCH: 31.3 pg (ref 26.0–34.0)
MCHC: 32.6 g/dL (ref 30.0–36.0)
MCV: 95.9 fL (ref 78.0–100.0)
MONO ABS: 0.2 10*3/uL (ref 0.1–1.0)
Monocytes Relative: 3 % (ref 3–12)
Neutro Abs: 6.6 10*3/uL (ref 1.7–7.7)
Neutrophils Relative %: 93 % — ABNORMAL HIGH (ref 43–77)
Platelets: 31 10*3/uL — ABNORMAL LOW (ref 150–400)
RBC: 1.95 MIL/uL — AB (ref 4.22–5.81)
RDW: 21.2 % — ABNORMAL HIGH (ref 11.5–15.5)
WBC: 7.1 10*3/uL (ref 4.0–10.5)

## 2014-04-29 LAB — URINE MICROSCOPIC-ADD ON

## 2014-04-29 LAB — BASIC METABOLIC PANEL
Anion gap: 22 — ABNORMAL HIGH (ref 5–15)
BUN: 106 mg/dL — ABNORMAL HIGH (ref 6–23)
CO2: 8 mEq/L — CL (ref 19–32)
Calcium: 7.6 mg/dL — ABNORMAL LOW (ref 8.4–10.5)
Chloride: 103 mEq/L (ref 96–112)
Creatinine, Ser: 16.63 mg/dL — ABNORMAL HIGH (ref 0.50–1.35)
GFR calc Af Amer: 3 mL/min — ABNORMAL LOW (ref >90)
GFR calc non Af Amer: 3 mL/min — ABNORMAL LOW (ref >90)
Glucose, Bld: 101 mg/dL — ABNORMAL HIGH (ref 70–99)
Potassium: 5.9 mEq/L — ABNORMAL HIGH (ref 3.7–5.3)
Sodium: 133 mEq/L — ABNORMAL LOW (ref 137–147)

## 2014-04-29 LAB — TYPE AND SCREEN
ABO/RH(D): A POS
ABO/RH(D): A POS
ANTIBODY SCREEN: NEGATIVE
Antibody Screen: NEGATIVE
UNIT DIVISION: 0
Unit division: 0
Unit division: 0
Unit division: 0

## 2014-04-29 LAB — URINALYSIS, ROUTINE W REFLEX MICROSCOPIC
BILIRUBIN URINE: NEGATIVE
Glucose, UA: NEGATIVE mg/dL
Ketones, ur: NEGATIVE mg/dL
Nitrite: NEGATIVE
Protein, ur: >300 mg/dL — AB
SPECIFIC GRAVITY, URINE: 1.017 (ref 1.005–1.030)
UROBILINOGEN UA: 0.2 mg/dL (ref 0.0–1.0)
pH: 6 (ref 5.0–8.0)

## 2014-04-29 LAB — PREPARE RBC (CROSSMATCH)

## 2014-04-29 LAB — ABO/RH: ABO/RH(D): A POS

## 2014-04-29 MED ORDER — SODIUM CHLORIDE 0.9 % IV SOLN: Freq: Once | INTRAVENOUS | Status: DC

## 2014-04-29 MED ORDER — HYDROMORPHONE HCL 1 MG/ML IJ SOLN
0.5000 mg | INTRAMUSCULAR | Status: DC | PRN
  Administered 2014-04-29 – 2014-05-03 (×4): 0.5 mg via INTRAVENOUS
  Filled 2014-04-29 (×4): qty 1

## 2014-04-29 MED ORDER — ZOLPIDEM TARTRATE 5 MG PO TABS
5.0000 mg | ORAL_TABLET | Freq: Once | ORAL | Status: AC
  Administered 2014-04-29: 5 mg via ORAL
  Filled 2014-04-29: qty 1

## 2014-04-29 NOTE — Progress Notes (Signed)
CRITICAL VALUE ALERT  Critical value received:  CO2 = 8  Date of notification:  04/29/2014  Time of notification: 8921  Critical value read back:Yes.     Nurse who received alert:  Gerald Stabs Lyan Holck   MD notified (1st page): K. Schorr   Time of first page: [phone]  MD notified (2nd page):  Time of second page:  Responding MD:    Time MD responded:

## 2014-04-29 NOTE — Progress Notes (Signed)
PROGRESS NOTE  [name] VQQ:[id number] DOB: June 22, 1950 DOA: 04/28/2014 PCP: [name], MD  Assessment/Plan: Obstructive uropathy/AKI/Nephrolithiasis/metabolic acidosis  -recurrent issue in setting of malignancy  -Dr. 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  -stable  -hold anticoagulation   Rash  -noted HSP/ plantar erythrodysethesia -secondary to chemo  - stable -cont topical cream   Code Status: full Family Communication: patient Disposition Plan:   Poor overall prognosis   Consultants:  urology  Procedures:      HPI/Subjective: C/o being thirsty  Objective: Filed Vitals:   04/29/14 0602  BP: 119/55  Pulse: 79  Temp: 97.5 F (36.4 C)  Resp: 14    Intake/Output Summary (Last 24 hours) at 04/29/14 0904 Last data filed at 04/29/14 0853  Gross per 24 hour  Intake 1821.58 ml  Output   1440 ml  Net 381.58 ml   Filed Weights   04/28/14 1354 04/28/14 2258 04/29/14 0603  Weight: 98.884 kg (218 lb) 99.5 kg (219 lb 5.7 oz) 99.6 kg (219 lb 9.3 oz)    Exam:   General:  Chronically ill appearing  Cardiovascular: rrr  Respiratory: clear  Abdomen: +BS, soft  Musculoskeletal: rashes all ext- worse on LE  Data Reviewed: Basic Metabolic Panel:  Recent Labs Lab 04/28/14 0851 04/29/14 0103  NA 134* 133*  K 5.4* 5.9*  CL 101 103  CO2 9* 8*  GLUCOSE 140* 101*  BUN 113* 106*  CREATININE 18.42* 16.63*  CALCIUM 7.9* 7.6*  MG 2.5  --    Liver Function Tests:  Recent Labs Lab 04/28/14 0851  AST 39*  ALT 34  ALKPHOS 76  BILITOT 0.6    PROT 6.0  ALBUMIN 2.0*   No results for input(s): LIPASE, AMYLASE in the last 168 hours. No results for input(s): AMMONIA in the last 168 hours. CBC:  Recent Labs Lab 04/28/14 0851 04/28/14 2034  WBC 10.5 9.8  NEUTROABS 9.5*  --   HGB 4.6* 6.4*  HCT 15.0* 19.3*  MCV 106.4* 97.5  PLT 52* 36*   Cardiac Enzymes: No results for input(s): CKTOTAL, CKMB, CKMBINDEX, TROPONINI in the last 168 hours. BNP (last 3 results) No results for input(s): PROBNP in the last 8760 hours. CBG: No results for input(s): GLUCAP in the last 168 hours.  No results found for this or any previous visit (from the past 240 hour(s)).   Studies: Ct Renal Stone Study  04/28/2014   CLINICAL DATA:  63 year old male with left-sided abdominal pelvic pain and hematuria. Renal stent came out 2 weeks ago. History of metastatic colon cancer.  EXAM: CT ABDOMEN AND PELVIS WITHOUT CONTRAST  TECHNIQUE: Multidetector CT imaging of the abdomen and pelvis was performed following the standard protocol without IV contrast.  COMPARISON:  04/21/2014 renal ultrasound.  02/24/2014 and prior CTs.  FINDINGS: Increasing moderate to severe right hydroureteronephrosis and severe left hydroureteronephrosis identified extending into the pelvis were there is unchanged soft tissue and a retractile process along the rectal suture line likely representing neoplasm causing urinary obstruction. Perirectal and precoccygeal/presacral stranding is unchanged.  There is high-density material within the bladder or representing blood or debris.  Multiple urinary calculi are identified including the following:  An 8 mm right lower pole calculus.  Several punctate right renal calculi.  A 10 x 15 mm calculus within the left renal pelvis.  A 1.2 x 2 cm calculus within the mid-lower left kidney.  A 0.9 x 1.2 cm calculus at the left UPJ which has migrated from the left renal pelvis since the prior CT. This calculus may be causing some degree of obstruction but the  left ureter appears dilated distal to this calculus.  The blood pool is very low density compatible with anemia.  The liver is unchanged.  Mild splenomegaly again noted.  The pancreas and adrenal glands are unremarkable.  There is no evidence of free fluid, enlarged lymph nodes, Please note that parenchymal abnormalities may be missed without intravenous contrast.  The patient is status post cholecystectomy.  A moderate wide mouthed anterior right parastomal hernia containing small bowel loops are noted.  There is no evidence of bowel obstruction, pneumoperitoneum or definite abscess.  No acute or suspicious bony abnormality is are noted.  IMPRESSION: Increasing moderate to severe right hydroureteronephrosis and severe left hydroureteronephrosis extending to the pelvis in the region of the patient's rectal suture lines/anastomosis.  0.9 x 1.2 cm calculus at the left UPJ which is migrated from the left renal pelvis since the CT dated 02/24/2014. This calculus may be causing some degree of obstruction, but the left ureter is dilated distal to this calculus.  Multiple bilateral renal calculi are otherwise unchanged.  High-density material within the bladder -question blood versus debris.  Unchanged soft tissue and stranding region of the rectal anastomosis.  Unchanged mild splenomegaly and right pelvic peristomal hernia containing small bowel. No evidence of bowel obstruction.   Electronically Signed   By: Hassan Rowan M.D.   On: 04/28/2014 18:16    Scheduled Meds: . sodium chloride  10 mL/hr Intravenous Once  . cyanocobalamin  1,000 mcg Intramuscular Q30 days  . folic acid  1 mg Oral Daily  . hydrocortisone cream  1 application Topical BID  . pantoprazole  40 mg Oral Daily  . sodium chloride  3 mL Intravenous Q12H   Continuous Infusions: . sodium chloride 75 mL/hr at 04/29/14 0018   Antibiotics Given (last 72 hours)    None      Active Problems:   Hematuria   AKI (acute kidney injury)   Obstructive  uropathy   Anemia   Hyperkalemia   Nephrolithiasis   Metabolic acidosis    Time spent: 35 min    [place]  [facility] Pager [phone]. If 7PM-7AM, please contact night-coverage at [email or web address], password [facility] 04/29/2014, 9:04 AM  LOS: 1 day

## 2014-04-29 NOTE — Progress Notes (Signed)
IR received request for left PCN, Dr. Anselm Pancoast has spoke with Dr. Jeffie Pollock today and would recommend urology intervention rather than percutaneous given thrombocytopenia and anemia, plt 36k, Hgb 6.4 The patient would need platelet and blood transfusion prior to percutaneous approach, Cr has slightly improved 16 (18) and Dr. Jeffie Pollock will continue to follow and see if intervention is needed. Please feel free to contact IR if needed.  Tsosie Billing PA-C Interventional Radiology  04/29/14  11:03 AM

## 2014-04-29 NOTE — Progress Notes (Signed)
Follow-up:  Notified by FM of pt's arrival from [facility] to room 5W-27. Pt was referred to ED at [facility] by [place] for anemia. Pt also c/o flank pain upon arrival to ED. Admitted for Nephrolithiasis, metabolic acidosis, anemia and thrombocytopenia in setting of metastatic disease s/p chemo/radiation. Trasnferred to [facility] for urology evaluation. Pt has been evaluated by Dr Jeffie Pollock w/ urology service. At bedside pt is noted in NAD. VSS. Pt reports feeling better since foley placed. Request medication to help him sleep. RN reports Kayexalate ordered at [facility] but not given for K+ of 5.4. Last B-Met at 0900 yesterday. Will repeat B-Met and give Kayexalate if indicated. Will add Ambien for sleep. Will continue to monitor closely.   Jeryl Columbia, NP-C [facility] Pager [phone]

## 2014-04-29 NOTE — Plan of Care (Signed)
Problem: Phase I Progression Outcomes Goal: Pain controlled with appropriate interventions Outcome: Completed/Met Date Met:  04/29/14

## 2014-04-29 NOTE — Plan of Care (Signed)
Problem: Phase I Progression Outcomes Goal: Voiding-avoid urinary catheter unless indicated Outcome: Not Applicable Date Met:  03/19/10

## 2014-04-29 NOTE — Progress Notes (Addendum)
CRITICAL VALUE ALERT  Critical value received:  H&H  Date of notification:  12/1  Time of notification:  0712  Critical value read back:Yes.    Nurse who received alert:  Deatra Ina RN   MD notified (1st page):  [phone]  Time of first page:    MD notified (2nd page):  Time of second page:  Responding MD:  Dr. Eliseo Squires  Time MD responded:  [phone]

## 2014-04-29 NOTE — Progress Notes (Signed)
Utilization review completed.  

## 2014-04-30 LAB — PATHOLOGIST SMEAR REVIEW

## 2014-04-30 LAB — COMPREHENSIVE METABOLIC PANEL
ALT: 34 U/L (ref 0–53)
AST: 36 U/L (ref 0–37)
Albumin: 1.7 g/dL — ABNORMAL LOW (ref 3.5–5.2)
Alkaline Phosphatase: 59 U/L (ref 39–117)
Anion gap: 18 — ABNORMAL HIGH (ref 5–15)
BUN: 80 mg/dL — ABNORMAL HIGH (ref 6–23)
CO2: 10 mEq/L — CL (ref 19–32)
Calcium: 7.1 mg/dL — ABNORMAL LOW (ref 8.4–10.5)
Chloride: 113 mEq/L — ABNORMAL HIGH (ref 96–112)
Creatinine, Ser: 9.93 mg/dL — ABNORMAL HIGH (ref 0.50–1.35)
GFR calc Af Amer: 6 mL/min — ABNORMAL LOW (ref >90)
GFR calc non Af Amer: 5 mL/min — ABNORMAL LOW (ref >90)
Glucose, Bld: 103 mg/dL — ABNORMAL HIGH (ref 70–99)
Potassium: 4.1 mEq/L (ref 3.7–5.3)
Sodium: 141 mEq/L (ref 137–147)
Total Bilirubin: 2.2 mg/dL — ABNORMAL HIGH (ref 0.3–1.2)
Total Protein: 4.7 g/dL — ABNORMAL LOW (ref 6.0–8.3)

## 2014-04-30 LAB — CBC
HEMATOCRIT: 22.6 % — AB (ref 39.0–52.0)
Hemoglobin: 7.8 g/dL — ABNORMAL LOW (ref 13.0–17.0)
MCH: 33.1 pg (ref 26.0–34.0)
MCHC: 34.5 g/dL (ref 30.0–36.0)
MCV: 95.8 fL (ref 78.0–100.0)
Platelets: 42 10*3/uL — ABNORMAL LOW (ref 150–400)
RBC: 2.36 MIL/uL — ABNORMAL LOW (ref 4.22–5.81)
RDW: 19.2 % — AB (ref 11.5–15.5)
WBC: 7.8 10*3/uL (ref 4.0–10.5)

## 2014-04-30 LAB — HEMOGLOBIN AND HEMATOCRIT, BLOOD
HCT: 22.6 % — ABNORMAL LOW (ref 39.0–52.0)
Hemoglobin: 7.7 g/dL — ABNORMAL LOW (ref 13.0–17.0)

## 2014-04-30 MED ORDER — SODIUM CHLORIDE 0.9 % IV SOLN: Freq: Once | INTRAVENOUS | Status: DC

## 2014-04-30 MED ORDER — SODIUM BICARBONATE 8.4 % IV SOLN
INTRAVENOUS | Status: DC
  Administered 2014-04-30 (×3): via INTRAVENOUS
  Filled 2014-04-30 (×6): qty 1000

## 2014-04-30 MED ORDER — SODIUM CHLORIDE 0.9 % IJ SOLN
10.0000 mL | INTRAMUSCULAR | Status: DC | PRN
  Administered 2014-04-30 – 2014-05-04 (×10): 10 mL
  Administered 2014-05-07 – 2014-05-08 (×2): 20 mL
  Filled 2014-04-30 (×11): qty 40

## 2014-04-30 NOTE — Progress Notes (Signed)
Having to Irrigate foley Q 2-3 hr due to the large amounts of blood clots stopping urine flow

## 2014-04-30 NOTE — Progress Notes (Signed)
[name] is an 63 y.o. male referred by Dr Clementeen Graham   Chief Complaint: Acute on CKD 3,  Bil ureteral obstruction HPI: 63yo WM with hx renal stones SP left ureteral stent placement last April and thinks he passed the stent 2 weeks ago.  Has developed gross hematuria now found to have bil ureteral obstruction.  n Baseline Scr appears to be in the upper 1's.  It increased to 7 on 04/14/14 and 12.4 on 04/21/14.  It eventually rose to 18.4 on 04/28/14 before trending down to 9.9 today.   He has noticed decreased UO over the last 2 weeks but when foley was placed and bladder clots removed urine output improved.  Hx met colon ca . Past Medical History  Diagnosis Date  . Pancytopenia, acquired 12/04/2010  . B12 deficiency 12/07/2010  . Kidney stones   . CAD (coronary artery disease)   . Anemia   . History of radiation therapy 06/19/07 -07/27/07    whole pelvis  . Hx of radiation therapy 08/06/12,08/09/12,& 08/13/12    rul 54GY/45fx  . Cancer     Colon ca dx 2008 surg/rad/chemo  . Lung cancer   . FH: chemotherapy   . Myocardial infarction   . Rectal bleeding 09/20/2013    From recurrent rectal mass/recurrent rectal adenocarcinoma  . Acute blood loss anemia 09/27/2013  . 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  2.7 cm primary, moderately differentiated with 3/8 positive lymph nodes without LVI.  Surgery was on 04/22/2007 and was treated postoperatively with radiation and concomitant capecitabine and then 4 cycles of a planned 6 with oxaliplatin and capecitabine.  His counts would not allow treatment of  . Hydronephrosis of left kidney 09/24/2013    Kidney stones; s/p cystoscopy and stent  . Gross hematuria 09/25/2103  . Lung metastases 07/09/2013  . Secondary malignancy of right lung 07/09/2013    Past Surgical History  Procedure Laterality Date  . Ileostomy  12/07/2010    Procedure: ILEOSTOMY;  Surgeon:  Donato Heinz;  Location: [facility] ORS;  Service: General;  Laterality: N/A;  Diverting Ileostomy Lysis of adhesions Exploratory Laparotomy  . Lung biopsy  10/27/10    rt lobe- adenocarcinoma  . Cholecystectomy    . Cataract extraction w/ intraocular lens implant  2007    bil  . Lithotripsy  2002  . Coronary angioplasty with stent placement  2003    stenting x 2  . Colon resection  2008    x2  . Esophagogastroduodenoscopy N/A 05/02/2013    Dr. Gala Romney- polypoid antral fold, bx= reactive gastropathy  . Givens capsule study N/A 05/02/2013    Procedure: GIVENS CAPSULE STUDY;  Surgeon: Daneil Dolin, MD;  Location: [facility] ENDO SUITE;  Service: Endoscopy;  Laterality: N/A;  . Colonoscopy N/A 09/20/2013    SLF:A near circumferential fungating mass with friable surfaces was found in the rectum.  ACTIVELY OOZING.  CLOTS SEEN IN LUMEN AND ASPIRATED.  Multiple biopsies were performed using cold forceps.  Thermal therapy(BICAP 7 Fr 25W) was used to ATTEMPT TO control bleeding.    HEMOSTASIS NOT ACHEIVED.  Marland Kitchen Cystoscopy w/ ureteral stent placement Left 09/24/2013    Procedure: CYSTOSCOPY WITH LEFT RETROGRADE PYELOGRAM; LEFT URETERAL STENT PLACEMENT; REMOVAL OF SMALL BLADDER CALCULI;  Surgeon: Marissa Nestle, MD;  Location: [facility] ORS;  Service: Urology;  Laterality: Left;  . Flexible sigmoidoscopy N/A 09/25/2013  BWI:OMBTDHRCB exophytic rectal mass most likely representing recurrent adenocarcinoma status post biopsy    Family History  Problem Relation Age of Onset  . Cervical cancer Mother   . Rectal cancer Father 60  . Uterine cancer Sister    Social History:  reports that he has quit smoking. His smoking use included Cigarettes and Cigars. He has a 60 pack-year smoking history. He has quit using smokeless tobacco. His smokeless tobacco use included Chew. He reports that he does not drink alcohol or use illicit drugs.  Allergies:  Allergies  Allergen Reactions  . Daypro [Oxaprozin] Nausea And Vomiting  .  Xeloda [Capecitabine] Other (See Comments)    Homocidal and suicidal ideations    Medications Prior to Admission  Medication Sig Dispense Refill  . amoxicillin-clavulanate (AUGMENTIN) 875-125 MG per tablet Take 1 tablet by mouth every 12 (twelve) hours.    . clindamycin (CLEOCIN-T) 1 % lotion Apply topically 2 (two) times daily. 60 mL 1  . cyanocobalamin (,VITAMIN B-12,) 1000 MCG/ML injection Inject 1,000 mcg into the muscle every 30 (thirty) days.     . folic acid (FOLVITE) 1 MG tablet Take 1 tablet (1 mg total) by mouth daily. 30 tablet 5  . HYDROcodone-acetaminophen (NORCO/VICODIN) 5-325 MG per tablet Take 1-2 tablets by mouth every 6 (six) hours as needed. 75 tablet 0  . hydrocortisone cream 1 % Apply 1 application topically 2 (two) times daily. Mix with urea cream and apply to affected areas. 30 g 5  . hydrocortisone-urea (CARMOL-HC) 1-10 % cream Apply topically 3 (three) times daily. 85 g 6  . lidocaine-prilocaine (EMLA) cream Apply a quarter size amount to port site 1 hour prior to chemo. Do not rub in. Cover with plastic wrap. 30 g 3  . omeprazole (PRILOSEC) 20 MG capsule Take 1 capsule (20 mg total) by mouth daily. 30 capsule 2  . ondansetron (ZOFRAN) 8 MG tablet Take 1 tablet (8 mg total) by mouth 2 (two) times daily as needed for nausea or vomiting. 30 tablet 1  . urea (CARMOL) 10 % cream Apply topically as needed. Mix with hydrocortisone cream and apply to affected areas. 71 g 5     Lab Results: UA: TNTC RBC's > 300 prot   Recent Labs  04/28/14 2034 04/29/14 1700 04/30/14 0553 04/30/14 0625  WBC 9.8 7.1 7.8  --   HGB 6.4* 6.1* 7.8* 7.7*  HCT 19.3* 18.7* 22.6* 22.6*  PLT 36* 31* 42*  --    BMET  Recent Labs  04/28/14 0851 04/29/14 0103 04/30/14 0553  NA 134* 133* 141  K 5.4* 5.9* 4.1  CL 101 103 113*  CO2 9* 8* 10*  GLUCOSE 140* 101* 103*  BUN 113* 106* 80*  CREATININE 18.42* 16.63* 9.93*  CALCIUM 7.9* 7.6* 7.1*   LFT  Recent Labs  04/30/14 0553  PROT  4.7*  ALBUMIN 1.7*  AST 36  ALT 34  ALKPHOS 59  BILITOT 2.2*   Ct Renal Stone Study  04/28/2014   CLINICAL DATA:  63 year old male with left-sided abdominal pelvic pain and hematuria. Renal stent came out 2 weeks ago. History of metastatic colon cancer.  EXAM: CT ABDOMEN AND PELVIS WITHOUT CONTRAST  TECHNIQUE: Multidetector CT imaging of the abdomen and pelvis was performed following the standard protocol without IV contrast.  COMPARISON:  04/21/2014 renal ultrasound.  02/24/2014 and prior CTs.  FINDINGS: Increasing moderate to severe right hydroureteronephrosis and severe left hydroureteronephrosis identified extending into the pelvis were there is unchanged soft tissue and  a retractile process along the rectal suture line likely representing neoplasm causing urinary obstruction. Perirectal and precoccygeal/presacral stranding is unchanged.  There is high-density material within the bladder or representing blood or debris.  Multiple urinary calculi are identified including the following:  An 8 mm right lower pole calculus.  Several punctate right renal calculi.  A 10 x 15 mm calculus within the left renal pelvis.  A 1.2 x 2 cm calculus within the mid-lower left kidney.  A 0.9 x 1.2 cm calculus at the left UPJ which has migrated from the left renal pelvis since the prior CT. This calculus may be causing some degree of obstruction but the left ureter appears dilated distal to this calculus.  The blood pool is very low density compatible with anemia.  The liver is unchanged.  Mild splenomegaly again noted.  The pancreas and adrenal glands are unremarkable.  There is no evidence of free fluid, enlarged lymph nodes, Please note that parenchymal abnormalities may be missed without intravenous contrast.  The patient is status post cholecystectomy.  A moderate wide mouthed anterior right parastomal hernia containing small bowel loops are noted.  There is no evidence of bowel obstruction, pneumoperitoneum or  definite abscess.  No acute or suspicious bony abnormality is are noted.  IMPRESSION: Increasing moderate to severe right hydroureteronephrosis and severe left hydroureteronephrosis extending to the pelvis in the region of the patient's rectal suture lines/anastomosis.  0.9 x 1.2 cm calculus at the left UPJ which is migrated from the left renal pelvis since the CT dated 02/24/2014. This calculus may be causing some degree of obstruction, but the left ureter is dilated distal to this calculus.  Multiple bilateral renal calculi are otherwise unchanged.  High-density material within the bladder -question blood versus debris.  Unchanged soft tissue and stranding region of the rectal anastomosis.  Unchanged mild splenomegaly and right pelvic peristomal hernia containing small bowel. No evidence of bowel obstruction.   Electronically Signed   By: Hassan Rowan M.D.   On: 04/28/2014 18:16    ROS: No change in vision No SOB No CP No abd pain Mild Lt CVA pain No new arthritic CO No new neuropathic CO Rash on hands and feet  PHYSICAL EXAM: Blood pressure 116/70, pulse 78, temperature 97.7 F (36.5 C), temperature source Oral, resp. rate 18, height _0  (1.778 m), weight 99.6 kg (219 lb 9.3 oz), SpO2 99 %. HEENT: PERRLA EOMI NECK:No JVD Chest Lt porta cath LUNGS:clear CARDIAC:RRR ABD:+ BS NTND  Back: Mild CVA tenderness EXT:2+ edema Skin: Petichial rash, nonblanching over both lower legs and hands NEURO:CNI Ox3 no asterixis  Assessment: 1. Acute renal failure sec to obstruction.  Renal fx has improved suggesting partial obstruction.  ? How much related to BOO vs ureteral obstruction 2. Nephrolithiasis 3. Met colon Ca 4. Met acidosis 5. Anemia and thrombocytopenia PLAN: 1. Cont IV bicarb 2. He needs relief of his obstruction which has partly occurred by placing foley and irrigating bladder though will need at least obstruction of lt ureter corrected 3. Daily labs 4. Renal diet for now 5.  This  is primarily a urologic issue   Rilynn Habel T 04/30/2014, 11:14 AM

## 2014-04-30 NOTE — Progress Notes (Signed)
IV team consult order placed as follows. Pt. Needs platelets and has a port a cath. Spoke with Lattie Haw, RN and told her that she can hook up platelets to clear cap on port a cath and IVT is not needed for this.

## 2014-04-30 NOTE — Progress Notes (Signed)
[facility] PROGRESS NOTE  [name] OFB:[id number] DOB: 1951-05-09 DOA: 04/28/2014 PCP: [name], MD  Brief narrative 63 year old male with history of metastatic colon cancer receiving chemotherapy in [place], anemia, coronary artery disease, history of hydronephrosis status post stenting in April 2015, nephrolithiasis, chronic kidney disease stage III who was sent to the ED from his hematology office. Patient reports intermittent hematuria for of about 2-3 weeks duration. Few days back he thought he passed his ureteral stent while using the bathroom. When he presented to his oncologist for chemotherapy he was found to have progressively worsened renal function. His last normal creatinine was 1.67 about one month back which worsened to 7.1 on 11/16. He was followed up again on 11/23 by his oncologist  and his creatinine had worsened to 12.47. A renal ultrasound was done which showed moderate bilateral hydronephrosis with nephrocalcinosis. On 11/30 he was found to have bilateral lower extremity purpura and increased leg swellings. Blood work done showed platelets of 52,000 and creatinine of 18.4. He was also found to be in metabolic acidosis with CO2 9 with high anion gap.  A CT scan of the abdomen done in 11/30 showed increasing moderate to severe right hydronephrosis and severe left hydronephrosis. Also showed a 0.9x0.2 cm calculus at the left UPJ and unchanged multiple bilateral renal calculi  Urology was consulted and recommended admission to [facility] initially but [facility].    Assessment/Plan: Acute on chronic kidney disease stage III secondary to obstructive uropathy with severe metabolic acidosis  Seen by Dr Jeffie Pollock and requested PCN by IR, however given low hemoglobin and platelets IR recommends urology intervention rather than PCN. Discussed with Dr. Jeffie Pollock who will see the patient again today. -She still having gross hematuria.  However his urine output is quite good after Foley placed on 12/1 and creatinine starting to improve. -Metabolic acidosis persists although improving. Started on D5 with 3 amps of bicarbonate.  -Appreciate renal consult. Definite treatment would be relief of obstruction with either cystoscopy and stent placement of PCN.   Anemia and thrombocytopenia Secondary to chemotherapy and metastatic colon cancer   H&H stable at 7.7 today after receiving 3 u PRBC since admission . Has received 6 units PRBC during 2 weeks prior to admission.   Monitor daily given active hematuria Low platelets of 42. Ordered 2 units of platelets.    hyperkalemia Received Kayexalate on 12/1. Showed improved with bicarbonate drip. 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 May transfer back to [facility] to be seen by his oncologist  Palpable purpura Noted HSP/ plantar erythrodysthesia Routine 2 up the cream  DVT prophylaxis: SCD  -Code Status: full code family Communication: none at [place] Disposition Plan: currently impatient   Consultants:  Dr Jeffie Pollock   renal  Procedures:  none  Antibiotics:  none  HPI/Subjective: patient seen and examined. Having gross hematuria overnight. Reports fatigue. Denies chest pain or shortness of breath  objective  Filed Vitals:   04/30/14 1350  BP: 127/54  Pulse: 86  Temp: 98.1 F (36.7 C)  Resp: 18    Intake/Output Summary (Last 24 hours) at 04/30/14 1532 Last data filed at 04/30/14 1350  Gross per 24 hour  Intake 4272.84 ml  Output   2505 ml  Net 1767.84 ml   Filed Weights   04/28/14 1354 04/28/14 2258 04/29/14 0603  Weight: 98.884 kg (218  lb) 99.5 kg (219 lb 5.7 oz) 99.6 kg (219 lb 9.3 oz)    Exam:   General:  middle aged male lying in bed appears fatigued  HEENT: Pallor present, no icterus, moist mucosa  Chest:  Diminished bibasilar breath sounds  CVS: Normal S1 and S2, no murmurs    abdomen:soft, NT, ND, ileostomy status, Foley in place with bloody urine  Extremities: Palpable purpura over the dorsum of bilateral feet, 1+ pitting edema bilaterally  CNS: Awake and alert, mild uremic symptoms Data Reviewed: Basic Metabolic Panel:  Recent Labs Lab 04/28/14 0851 04/29/14 0103 04/30/14 0553  NA 134* 133* 141  K 5.4* 5.9* 4.1  CL 101 103 113*  CO2 9* 8* 10*  GLUCOSE 140* 101* 103*  BUN 113* 106* 80*  CREATININE 18.42* 16.63* 9.93*  CALCIUM 7.9* 7.6* 7.1*  MG 2.5  --   --    Liver Function Tests:  Recent Labs Lab 04/28/14 0851 04/30/14 0553  AST 39* 36  ALT 34 34  ALKPHOS 76 59  BILITOT 0.6 2.2*  PROT 6.0 4.7*  ALBUMIN 2.0* 1.7*   No results for input(s): LIPASE, AMYLASE in the last 168 hours. No results for input(s): AMMONIA in the last 168 hours. CBC:  Recent Labs Lab 04/28/14 0851 04/28/14 2034 04/29/14 1700 04/30/14 0553 04/30/14 0625  WBC 10.5 9.8 7.1 7.8  --   NEUTROABS 9.5*  --  6.6  --   --   HGB 4.6* 6.4* 6.1* 7.8* 7.7*  HCT 15.0* 19.3* 18.7* 22.6* 22.6*  MCV 106.4* 97.5 95.9 95.8  --   PLT 52* 36* 31* 42*  --    Cardiac Enzymes: No results for input(s): CKTOTAL, CKMB, CKMBINDEX, TROPONINI in the last 168 hours. BNP (last 3 results) No results for input(s): PROBNP in the last 8760 hours. CBG: No results for input(s): GLUCAP in the last 168 hours.  No results found for this or any previous visit (from the past 240 hour(s)).   Studies: Ct Renal Stone Study  04/28/2014   CLINICAL DATA:  63 year old male with left-sided abdominal pelvic pain and hematuria. Renal stent came out 2 weeks ago. History of metastatic colon cancer.  EXAM: CT ABDOMEN AND PELVIS WITHOUT CONTRAST  TECHNIQUE: Multidetector CT imaging of the abdomen and pelvis was performed following the standard protocol without IV contrast.  COMPARISON:  04/21/2014 renal ultrasound.  02/24/2014 and  prior CTs.  FINDINGS: Increasing moderate to severe right hydroureteronephrosis and severe left hydroureteronephrosis identified extending into the pelvis were there is unchanged soft tissue and a retractile process along the rectal suture line likely representing neoplasm causing urinary obstruction. Perirectal and precoccygeal/presacral stranding is unchanged.  There is high-density material within the bladder or representing blood or debris.  Multiple urinary calculi are identified including the following:  An 8 mm right lower pole calculus.  Several punctate right renal calculi.  A 10 x 15 mm calculus within the left renal pelvis.  A 1.2 x 2 cm calculus within the mid-lower left kidney.  A 0.9 x 1.2 cm calculus at the left UPJ which has migrated from the left renal pelvis since the prior CT. This calculus may be causing some degree of obstruction but the left ureter appears dilated distal to this calculus.  The blood pool is very low density compatible with anemia.  The liver is unchanged.  Mild splenomegaly again noted.  The pancreas and adrenal glands are unremarkable.  There is no evidence of free fluid, enlarged lymph nodes, Please note  that parenchymal abnormalities may be missed without intravenous contrast.  The patient is status post cholecystectomy.  A moderate wide mouthed anterior right parastomal hernia containing small bowel loops are noted.  There is no evidence of bowel obstruction, pneumoperitoneum or definite abscess.  No acute or suspicious bony abnormality is are noted.  IMPRESSION: Increasing moderate to severe right hydroureteronephrosis and severe left hydroureteronephrosis extending to the pelvis in the region of the patient's rectal suture lines/anastomosis.  0.9 x 1.2 cm calculus at the left UPJ which is migrated from the left renal pelvis since the CT dated 02/24/2014. This calculus may be causing some degree of obstruction, but the left ureter is dilated distal to this calculus.   Multiple bilateral renal calculi are otherwise unchanged.  High-density material within the bladder -question blood versus debris.  Unchanged soft tissue and stranding region of the rectal anastomosis.  Unchanged mild splenomegaly and right pelvic peristomal hernia containing small bowel. No evidence of bowel obstruction.   Electronically Signed   By: Hassan Rowan M.D.   On: 04/28/2014 18:16    Scheduled Meds: . sodium chloride  10 mL/hr Intravenous Once  . sodium chloride   Intravenous Once  . sodium chloride   Intravenous Once  . cyanocobalamin  1,000 mcg Intramuscular Q30 days  . folic acid  1 mg Oral Daily  . hydrocortisone cream  1 application Topical BID  . pantoprazole  40 mg Oral Daily  . sodium chloride  3 mL Intravenous Q12H   Continuous Infusions: . dextrose 5 % 1,000 mL with sodium bicarbonate 150 mEq infusion 100 mL/hr at 04/30/14 0909      Total time spent: 35 minutes    Lanessa Shill, [facility] Pager [phone] If 7PM-7AM, please contact night-coverage at [email or web address], password [facility] 04/30/2014, 3:32 PM  LOS: 2 days

## 2014-04-30 NOTE — Progress Notes (Signed)
Patient ID: [name], male   DOB: March 09, 1951, 63 y.o.   MRN: [id number]    Subjective: [name] has had some issues with clot obstruction today that responds to irrigation.  The urine in the bag is red but not thick and it is clearing in the tube.   His Cr has declined to 9.93 and his Hgb is up to 7.7 post transfusion.  ROS:  Review of Systems  Constitutional: Negative for fever.  Gastrointestinal: Positive for nausea (with dinner) and abdominal pain.  Genitourinary: Positive for flank pain (the pain occurred when he was obstructed. ).    Anti-infectives: Anti-infectives    None      Current Facility-Administered Medications  Medication Dose Route Frequency Provider Last Rate Last Dose  . 0.9 %  sodium chloride infusion  10 mL/hr Intravenous Once Carmin Muskrat, MD      . 0.9 %  sodium chloride infusion   Intravenous Once Jessica U Vann, DO      . 0.9 %  sodium chloride infusion   Intravenous Once Nishant Dhungel, MD      . cyanocobalamin ((VITAMIN B-12)) injection 1,000 mcg  1,000 mcg Intramuscular Q30 days Shanda Howells, MD   1,000 mcg at 04/29/14 0947  . dextrose 5 % 1,000 mL with sodium bicarbonate 150 mEq infusion   Intravenous Continuous Nishant Dhungel, MD 100 mL/hr at 04/30/14 0909    . folic acid (FOLVITE) tablet 1 mg  1 mg Oral Daily Shanda Howells, MD   1 mg at 04/30/14 1015  . HYDROcodone-acetaminophen (NORCO/VICODIN) 5-325 MG per tablet 1-2 tablet  1-2 tablet Oral Q6H PRN Shanda Howells, MD   2 tablet at 04/29/14 2121  . hydrocortisone cream 1 % 1 application  1 application Topical BID Shanda Howells, MD   1 application at 17/51/02 1016  . hydrocortisone cream 1 %   Topical TID PRN Carmin Muskrat, MD      . HYDROmorphone (DILAUDID) injection 0.5 mg  0.5 mg Intravenous Q4H PRN Geradine Girt, DO   0.5 mg at 04/30/14 0724  . ondansetron (ZOFRAN) tablet 8 mg  8 mg Oral BID PRN Shanda Howells, MD      . pantoprazole (PROTONIX) EC tablet 40 mg  40 mg Oral Daily Shanda Howells,  MD   40 mg at 04/30/14 1015  . sodium chloride 0.9 % injection 10-40 mL  10-40 mL Intracatheter PRN Nishant Dhungel, MD   10 mL at 04/30/14 0627  . sodium chloride 0.9 % injection 3 mL  3 mL Intravenous Q12H Shanda Howells, MD   3 mL at 04/29/14 0042   Facility-Administered Medications Ordered in Other Encounters  Medication Dose Route Frequency Provider Last Rate Last Dose  . 0.9 %  sodium chloride infusion   Intravenous Continuous Pieter Partridge, MD 20 mL/hr at 07/17/12 1113    . heparin lock flush 100 unit/mL  500 Units Intravenous Once Pieter Partridge, MD      . sodium chloride 0.9 % injection 10 mL  10 mL Intravenous PRN Pieter Partridge, MD   10 mL at 07/17/12 1113     Objective: Vital signs in last 24 hours: Temp:  [97.5 F (36.4 C)-98.1 F (36.7 C)] 98 F (36.7 C) (12/02 1623) Pulse Rate:  [77-98] 91 (12/02 1623) Resp:  [16-22] 16 (12/02 1623) BP: (106-134)/(52-70) 124/64 mmHg (12/02 1623) SpO2:  [99 %-100 %] 100 % (12/02 1623)  Intake/Output from previous day: 12/01 0701 - 12/02 0700 In: 4206.2 [P.O.:800; I.V.:2100;  Blood:706.2] Out: 2905 [Urine:2455; Stool:450] Intake/Output this shift: Total I/O In: 2041.7 [P.O.:400; I.V.:935; Blood:606.7; Other:100] Out: 1100 [Urine:1100]   Physical Exam  Constitutional: He is well-developed, well-nourished, and in no distress.  His color has improved  Genitourinary:  The foley tubing has pink to red urine.     Lab Results:   Recent Labs  04/29/14 1700 04/30/14 0553 04/30/14 0625  WBC 7.1 7.8  --   HGB 6.1* 7.8* 7.7*  HCT 18.7* 22.6* 22.6*  PLT 31* 42*  --    BMET  Recent Labs  04/29/14 0103 04/30/14 0553  NA 133* 141  K 5.9* 4.1  CL 103 113*  CO2 8* 10*  GLUCOSE 101* 103*  BUN 106* 80*  CREATININE 16.63* 9.93*  CALCIUM 7.6* 7.1*   PT/INR No results for input(s): LABPROT, INR in the last 72 hours. ABG No results for input(s): PHART, HCO3 in the last 72 hours.  Invalid input(s): PCO2,  PO2  Studies/Results: Ct Renal Stone Study  04/28/2014   CLINICAL DATA:  63 year old male with left-sided abdominal pelvic pain and hematuria. Renal stent came out 2 weeks ago. History of metastatic colon cancer.  EXAM: CT ABDOMEN AND PELVIS WITHOUT CONTRAST  TECHNIQUE: Multidetector CT imaging of the abdomen and pelvis was performed following the standard protocol without IV contrast.  COMPARISON:  04/21/2014 renal ultrasound.  02/24/2014 and prior CTs.  FINDINGS: Increasing moderate to severe right hydroureteronephrosis and severe left hydroureteronephrosis identified extending into the pelvis were there is unchanged soft tissue and a retractile process along the rectal suture line likely representing neoplasm causing urinary obstruction. Perirectal and precoccygeal/presacral stranding is unchanged.  There is high-density material within the bladder or representing blood or debris.  Multiple urinary calculi are identified including the following:  An 8 mm right lower pole calculus.  Several punctate right renal calculi.  A 10 x 15 mm calculus within the left renal pelvis.  A 1.2 x 2 cm calculus within the mid-lower left kidney.  A 0.9 x 1.2 cm calculus at the left UPJ which has migrated from the left renal pelvis since the prior CT. This calculus may be causing some degree of obstruction but the left ureter appears dilated distal to this calculus.  The blood pool is very low density compatible with anemia.  The liver is unchanged.  Mild splenomegaly again noted.  The pancreas and adrenal glands are unremarkable.  There is no evidence of free fluid, enlarged lymph nodes, Please note that parenchymal abnormalities may be missed without intravenous contrast.  The patient is status post cholecystectomy.  A moderate wide mouthed anterior right parastomal hernia containing small bowel loops are noted.  There is no evidence of bowel obstruction, pneumoperitoneum or definite abscess.  No acute or suspicious bony  abnormality is are noted.  IMPRESSION: Increasing moderate to severe right hydroureteronephrosis and severe left hydroureteronephrosis extending to the pelvis in the region of the patient's rectal suture lines/anastomosis.  0.9 x 1.2 cm calculus at the left UPJ which is migrated from the left renal pelvis since the CT dated 02/24/2014. This calculus may be causing some degree of obstruction, but the left ureter is dilated distal to this calculus.  Multiple bilateral renal calculi are otherwise unchanged.  High-density material within the bladder -question blood versus debris.  Unchanged soft tissue and stranding region of the rectal anastomosis.  Unchanged mild splenomegaly and right pelvic peristomal hernia containing small bowel. No evidence of bowel obstruction.   Electronically Signed   By: Merry Proud  Hu M.D.   On: 04/28/2014 18:16   I have reviewed his recent labs and discussed his case with Dr. Markus Daft regarding the timing of a left perc and also with Dr. Clementeen Graham regarding the persistent hematuria.   Assessment: Clot retention with ARI improving with foley drainage. The urine remains bloody, but it appears most consistent with old blood then significant active bleeding.  We elected to hold off of the perc for now because of the improving ARI and his thrombocytopenia and anemia.    Plan: He is getting platelets now to see if that will help the hematuria. Continue prn irrigation. I will reassess in the am to determine if he can have bedside cystoscopy or if he will need to go to the OR for further evaluation of his bladder.      LOS: 2 days    Malka So 04/30/2014

## 2014-04-30 NOTE — Progress Notes (Addendum)
CRITICAL VALUE ALERT  Critical value received: C02 10  Date of notification:  12/2  Time of notification:  0755  Critical value read back:Yes.    Nurse who received alert:  Deatra Ina rn   MD notified (1st page):  Dr. Clementeen Graham   Time of first page:  [phone]  MD notified (2nd page):  Time of second page:  Responding MD:  Dr. Clementeen Graham   Time MD responded:  0800

## 2014-05-01 DIAGNOSIS — D62 Acute posthemorrhagic anemia: Secondary | ICD-10-CM

## 2014-05-01 LAB — CBC
HCT: 24.5 % — ABNORMAL LOW (ref 39.0–52.0)
HEMATOCRIT: 18.6 % — AB (ref 39.0–52.0)
HEMATOCRIT: 20.4 % — AB (ref 39.0–52.0)
HEMOGLOBIN: 6.3 g/dL — AB (ref 13.0–17.0)
HEMOGLOBIN: 8.2 g/dL — AB (ref 13.0–17.0)
Hemoglobin: 6.7 g/dL — CL (ref 13.0–17.0)
MCH: 32.5 pg (ref 26.0–34.0)
MCH: 30.6 pg (ref 26.0–34.0)
MCH: 30.6 pg (ref 26.0–34.0)
MCHC: 33.9 g/dL (ref 30.0–36.0)
MCHC: 32.8 g/dL (ref 30.0–36.0)
MCHC: 33.5 g/dL (ref 30.0–36.0)
MCV: 95.9 fL (ref 78.0–100.0)
MCV: 93.2 fL (ref 78.0–100.0)
MCV: 91.4 fL (ref 78.0–100.0)
Platelets: 36 10*3/uL — ABNORMAL LOW (ref 150–400)
Platelets: 37 10*3/uL — ABNORMAL LOW (ref 150–400)
Platelets: 38 10*3/uL — ABNORMAL LOW (ref 150–400)
RBC: 1.94 MIL/uL — ABNORMAL LOW (ref 4.22–5.81)
RBC: 2.19 MIL/uL — ABNORMAL LOW (ref 4.22–5.81)
RBC: 2.68 MIL/uL — AB (ref 4.22–5.81)
RDW: 20.7 % — ABNORMAL HIGH (ref 11.5–15.5)
RDW: 20 % — AB (ref 11.5–15.5)
RDW: 19.3 % — ABNORMAL HIGH (ref 11.5–15.5)
WBC: 5.3 10*3/uL (ref 4.0–10.5)
WBC: 6.2 10*3/uL (ref 4.0–10.5)
WBC: 7.5 10*3/uL (ref 4.0–10.5)

## 2014-05-01 LAB — RENAL FUNCTION PANEL
Albumin: 1.8 g/dL — ABNORMAL LOW (ref 3.5–5.2)
Anion gap: 13 (ref 5–15)
BUN: 57 mg/dL — AB (ref 6–23)
CALCIUM: 6.8 mg/dL — AB (ref 8.4–10.5)
CO2: 19 mEq/L (ref 19–32)
Chloride: 111 mEq/L (ref 96–112)
Creatinine, Ser: 5.97 mg/dL — ABNORMAL HIGH (ref 0.50–1.35)
GFR calc Af Amer: 11 mL/min — ABNORMAL LOW (ref >90)
GFR calc non Af Amer: 9 mL/min — ABNORMAL LOW (ref >90)
Glucose, Bld: 113 mg/dL — ABNORMAL HIGH (ref 70–99)
PHOSPHORUS: 6.7 mg/dL — AB (ref 2.3–4.6)
Potassium: 3.2 mEq/L — ABNORMAL LOW (ref 3.7–5.3)
Sodium: 143 mEq/L (ref 137–147)

## 2014-05-01 LAB — PREPARE PLATELET PHERESIS
UNIT DIVISION: 0
Unit division: 0

## 2014-05-01 LAB — PREPARE RBC (CROSSMATCH)

## 2014-05-01 MED ORDER — POTASSIUM CHLORIDE CRYS ER 20 MEQ PO TBCR
40.0000 meq | EXTENDED_RELEASE_TABLET | Freq: Once | ORAL | Status: AC
  Administered 2014-05-01: 40 meq via ORAL
  Filled 2014-05-01: qty 2

## 2014-05-01 MED ORDER — GLUCAGON HCL RDNA (DIAGNOSTIC) 1 MG IJ SOLR: INTRAMUSCULAR | Status: AC | PRN

## 2014-05-01 MED ORDER — SODIUM CHLORIDE 0.9 % IV SOLN: Freq: Once | INTRAVENOUS | Status: DC

## 2014-05-01 MED ORDER — SODIUM CHLORIDE 0.9 % IV SOLN
Freq: Once | INTRAVENOUS | Status: AC
  Administered 2014-05-01: 11:00:00 via INTRAVENOUS

## 2014-05-01 MED ORDER — MIDAZOLAM HCL 2 MG/2ML IJ SOLN: INTRAMUSCULAR | Status: AC | PRN

## 2014-05-01 MED ORDER — FENTANYL CITRATE 0.05 MG/ML IJ SOLN: INTRAMUSCULAR | Status: AC | PRN

## 2014-05-01 NOTE — Progress Notes (Signed)
Patient ID: [name], male   DOB: December 23, 1950, 63 y.o.   MRN: [id number]   Cystoscopy with clot evacuation done this evening.   See dictation.    He was found to have a large amount of old and fresh clot that appears to have come from the left collecting system.  There was a clot protruding from the ureteral orifice on the left and I didn't dislodge it following evacuation of the bladder clots with a 37fr [place] foley.  His prostate was short and minimally obstructing and the bladder wall had no evidence of cancer or stones.    I elected to leave the foley out but if it has to be replaced, a 32fr foley would be most appropriate.    I see that his platelet count remains low and the most likely source of bleeding are the left ureteral and renal stones.     I still think he is going to need a left percutaneous nephrolithotomy if his platelet count can be sufficiently elevated for placement of a perc tube.  The Cr continues to decline.

## 2014-05-01 NOTE — Progress Notes (Signed)
[facility] PROGRESS NOTE  [name] PNT:[id number] DOB: 1951/05/30 DOA: 04/28/2014 PCP: [name], MD  Brief narrative 63 year old male with history of metastatic colon cancer receiving chemotherapy in [place], anemia, coronary artery disease, history of hydronephrosis status post stenting in April 2015, nephrolithiasis, chronic kidney disease stage III who was sent to the ED from his hematology office. Patient reports intermittent hematuria for of about 2-3 weeks duration. Few days back he thought he passed his ureteral stent while using the bathroom. When he presented to his oncologist for chemotherapy he was found to have progressively worsened renal function. His last normal creatinine was 1.67 about one month back which worsened to 7.1 on 11/16. He was followed up again on 11/23 by his oncologist  and his creatinine had worsened to 12.47. A renal ultrasound was done which showed moderate bilateral hydronephrosis with nephrocalcinosis. On 11/30 he was found to have bilateral lower extremity purpura and increased leg swellings. Blood work done showed platelets of 52,000 and creatinine of 18.4. He was also found to be in metabolic acidosis with CO2 9 with high anion gap.  A CT scan of the abdomen done in 11/30 showed increasing moderate to severe right hydronephrosis and severe left hydronephrosis. Also showed a 0.9x0.2 cm calculus at the left UPJ and unchanged multiple bilateral renal calculi  Urology was consulted and recommended admission to [facility] initially but patient was transferred to [facility] for further evaluation.    Assessment/Plan: Acute on chronic kidney disease stage III secondary to obstructive uropathy with severe metabolic acidosis  Seen by Dr Jeffie Pollock and requested PCN by IR, however given low hemoglobin and platelets IR recommends urology intervention rather than PCN. -She still having gross hematuria. Has good urine outpt on foley. Renal function improving.  creatinie 5.9 today. No uremic signs.  -AG Metabolic acidosis resolved with improvement in renal fn and bicarb drip started on 12/2. D/c drip today. -Appreciate renal consult. Definite treatment would be relief of obstruction with either cystoscopy and stent placement or PCN. Dr Jeffie Pollock plans on bedside cystoscopy today.    Anemia and thrombocytopenia Secondary to chemotherapy and metastatic colon cancer   Hb dropped to 6.3 this am .  received  3 u PRBC since admission . Ordered 1 u today. Received 2 units plts on 12/2. No improvement  Has received 6 units PRBC during 2 weeks prior to admission.    Monitor h&h daily .    hyperkalemia Received Kayexalate on 12/1. Resolved. Hypokalemic today.    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 May transfer back to [facility] to be seen by his oncologist  Palpable purpura Noted HSP/ plantar erythrodysthesia. Reports to be due to chemotherapy Apply topical cream  Protein calorie malnutrition, severe Nutrition consult   DVT prophylaxis: SCD  -Code Status: full code family Communication: none at bedside. Will update wife Disposition Plan: currently impatient   Consultants:  Dr Jeffie Pollock   renal  Procedures:  none  Antibiotics:  none  HPI/Subjective: patient seen and examined.still having gross hematuria overnight. Reports feeling tired. No SOB or chest discomfort.   objective  Filed Vitals:   05/01/14 0530  BP: 109/53  Pulse: 83  Temp: 97.8 F (36.6 C)  Resp: 18    Intake/Output Summary (Last 24 hours) at 05/01/14 0836 Last data filed at 05/01/14 0700  Gross per 24 hour  Intake 3843.67 ml  Output   2470 ml  Net  1373.67 ml   Filed Weights   04/28/14 2258 04/29/14 0603 05/01/14 0530  Weight: 99.5 kg (219 lb 5.7 oz) 99.6 kg (219 lb 9.3 oz) 100 kg (220 lb 7.4 oz)    Exam:   General:  middle aged male lying in bed  appears fatigued  HEENT: Pallor present,  moist mucosa  Chest: Diminished bibasilar breath sounds  CVS: Normal S1 and S2, no murmurs    abdomen:soft, NT, ND, ileostomy status, Foley in place with bloody urine  Extremities: Palpable purpura over the dorsum of bilateral feet, 1+ pitting edema bilaterally  CNS: Awake and alert, no uremic signs   Data Reviewed: Basic Metabolic Panel:  Recent Labs Lab 04/28/14 0851 04/29/14 0103 04/30/14 0553 05/01/14 0551  NA 134* 133* 141 143  K 5.4* 5.9* 4.1 3.2*  CL 101 103 113* 111  CO2 9* 8* 10* 19  GLUCOSE 140* 101* 103* 113*  BUN 113* 106* 80* 57*  CREATININE 18.42* 16.63* 9.93* 5.97*  CALCIUM 7.9* 7.6* 7.1* 6.8*  MG 2.5  --   --   --   PHOS  --   --   --  6.7*   Liver Function Tests:  Recent Labs Lab 04/28/14 0851 04/30/14 0553 05/01/14 0551  AST 39* 36  --   ALT 34 34  --   ALKPHOS 76 59  --   BILITOT 0.6 2.2*  --   PROT 6.0 4.7*  --   ALBUMIN 2.0* 1.7* 1.8*   No results for input(s): LIPASE, AMYLASE in the last 168 hours. No results for input(s): AMMONIA in the last 168 hours. CBC:  Recent Labs Lab 04/28/14 0851 04/28/14 2034 04/29/14 1700 04/30/14 0553 04/30/14 0625 05/01/14 0551  WBC 10.5 9.8 7.1 7.8  --  5.3  NEUTROABS 9.5*  --  6.6  --   --   --   HGB 4.6* 6.4* 6.1* 7.8* 7.7* 6.3*  HCT 15.0* 19.3* 18.7* 22.6* 22.6* 18.6*  MCV 106.4* 97.5 95.9 95.8  --  95.9  PLT 52* 36* 31* 42*  --  36*   Cardiac Enzymes: No results for input(s): CKTOTAL, CKMB, CKMBINDEX, TROPONINI in the last 168 hours. BNP (last 3 results) No results for input(s): PROBNP in the last 8760 hours. CBG: No results for input(s): GLUCAP in the last 168 hours.  No results found for this or any previous visit (from the past 240 hour(s)).   Studies: No results found.  Scheduled Meds: . sodium chloride  10 mL/hr Intravenous Once  . sodium chloride   Intravenous Once  . sodium chloride   Intravenous Once  . sodium chloride    Intravenous Once  . cyanocobalamin  1,000 mcg Intramuscular Q30 days  . folic acid  1 mg Oral Daily  . hydrocortisone cream  1 application Topical BID  . pantoprazole  40 mg Oral Daily  . sodium chloride  3 mL Intravenous Q12H   Continuous Infusions:      Total time spent: 35 minutes    [name]  [facility] Pager [phone] If 7PM-7AM, please contact night-coverage at [email or web address], password [facility] 05/01/2014, 8:36 AM  LOS: 3 days

## 2014-05-01 NOTE — Progress Notes (Signed)
S: No new CO.  Eating breakfast O:BP 109/53 mmHg  Pulse 83  Temp(Src) 97.8 F (36.6 C) (Oral)  Resp 18  Ht _0  (1.778 m)  Wt 100 kg (220 lb 7.4 oz)  BMI 31.63 kg/m2  SpO2 100%  Intake/Output Summary (Last 24 hours) at 05/01/14 0951 Last data filed at 05/01/14 0839  Gross per 24 hour  Intake 3853.67 ml  Output   2470 ml  Net 1383.67 ml   Weight change:  EBR:AXENM and alert CVS:RRR Resp: clear Abd:+BS NTND Ext: 1-2+ edema NEURO:CNI OX3 no asterixis   . sodium chloride  10 mL/hr Intravenous Once  . sodium chloride   Intravenous Once  . sodium chloride   Intravenous Once  . sodium chloride   Intravenous Once  . cyanocobalamin  1,000 mcg Intramuscular Q30 days  . folic acid  1 mg Oral Daily  . hydrocortisone cream  1 application Topical BID  . pantoprazole  40 mg Oral Daily  . potassium chloride  40 mEq Oral Once  . sodium chloride  3 mL Intravenous Q12H   No results found. BMET    Component Value Date/Time   NA 143 05/01/2014 0551   K 3.2* 05/01/2014 0551   CL 111 05/01/2014 0551   CO2 19 05/01/2014 0551   GLUCOSE 113* 05/01/2014 0551   BUN 57* 05/01/2014 0551   CREATININE 5.97* 05/01/2014 0551   CALCIUM 6.8* 05/01/2014 0551   GFRNONAA 9* 05/01/2014 0551   GFRAA 11* 05/01/2014 0551   CBC    Component Value Date/Time   WBC 5.3 05/01/2014 0551   RBC 1.94* 05/01/2014 0551   HGB 6.3* 05/01/2014 0551   HCT 18.6* 05/01/2014 0551   PLT 36* 05/01/2014 0551   MCV 95.9 05/01/2014 0551   MCH 32.5 05/01/2014 0551   MCHC 33.9 05/01/2014 0551   RDW 20.7* 05/01/2014 0551   LYMPHSABS 0.3* 04/29/2014 1700   MONOABS 0.2 04/29/2014 1700   EOSABS 0.1 04/29/2014 1700   BASOSABS 0.0 04/29/2014 1700     Assessment:  1. Acute renal failure sec to obstruction.  Scr trending down 2. Nephrolithiasis 3. Met colon Ca 4. Met acidosis, improved 5. Anemia and thrombocytopenia Plan: 1. Note plans for bedside cystoscopy 2. Note plans for transfusion 3. Daily  labs   [name] T

## 2014-05-01 NOTE — Sedation Documentation (Addendum)
Patient denies pain and is resting comfortably.  

## 2014-05-01 NOTE — Plan of Care (Signed)
Problem: Phase I Progression Outcomes Goal: Other Phase I Outcomes/Goals Outcome: Not Applicable Date Met:  58/44/17  Problem: Phase II Progression Outcomes Goal: Other Phase II Outcomes/Goals Outcome: Not Applicable Date Met:  12/78/71  Problem: Phase III Progression Outcomes Goal: Other Phase III Outcomes/Goals Outcome: Not Applicable Date Met:  83/67/25  Problem: Discharge Progression Outcomes Goal: Pain controlled with appropriate interventions Outcome: Completed/Met Date Met:  05/01/14 Goal: Other Discharge Outcomes/Goals Outcome: Not Applicable Date Met:  50/01/64

## 2014-05-01 NOTE — Progress Notes (Signed)
Patient ID: [name], male   DOB: [birth year], 63 y.o.   MRN: [id number]  The urine in the foley is light pink this morning without clots.   I will come back later today to do bedside cystoscopy.  I reviewed the procedure with the patient.

## 2014-05-01 NOTE — Progress Notes (Signed)
CRITICAL VALUE ALERT  Critical value received: hemoglobin 6.3  Date of notification:  05/01/2014   Time of notification:  0655  Critical value read back:Yes.    Nurse who received alert:  Coy Saunas   MD notified (1st page):  K.Schorr  Time of first page:  [phone]  MD notified (2nd page):  Time of second page:  Responding MD:    Time MD responded:

## 2014-05-02 ENCOUNTER — Encounter (HOSPITAL_COMMUNITY): Payer: Self-pay | Admitting: Radiology

## 2014-05-02 DIAGNOSIS — D500 Iron deficiency anemia secondary to blood loss (chronic): Secondary | ICD-10-CM

## 2014-05-02 LAB — CBC
HEMATOCRIT: 22.8 % — AB (ref 39.0–52.0)
Hemoglobin: 7.5 g/dL — ABNORMAL LOW (ref 13.0–17.0)
MCH: 30.6 pg (ref 26.0–34.0)
MCHC: 32.9 g/dL (ref 30.0–36.0)
MCV: 93.1 fL (ref 78.0–100.0)
PLATELETS: 35 10*3/uL — AB (ref 150–400)
RBC: 2.45 MIL/uL — ABNORMAL LOW (ref 4.22–5.81)
RDW: 19.9 % — AB (ref 11.5–15.5)
WBC: 5.9 10*3/uL (ref 4.0–10.5)

## 2014-05-02 LAB — BASIC METABOLIC PANEL
ANION GAP: 11 (ref 5–15)
BUN: 49 mg/dL — ABNORMAL HIGH (ref 6–23)
CHLORIDE: 111 meq/L (ref 96–112)
CO2: 21 mEq/L (ref 19–32)
Calcium: 7.3 mg/dL — ABNORMAL LOW (ref 8.4–10.5)
Creatinine, Ser: 4.82 mg/dL — ABNORMAL HIGH (ref 0.50–1.35)
GFR calc non Af Amer: 12 mL/min — ABNORMAL LOW (ref >90)
GFR, EST AFRICAN AMERICAN: 14 mL/min — AB (ref >90)
Glucose, Bld: 84 mg/dL (ref 70–99)
Potassium: 3.7 mEq/L (ref 3.7–5.3)
Sodium: 143 mEq/L (ref 137–147)

## 2014-05-02 LAB — TYPE AND SCREEN
ABO/RH(D): A POS
ANTIBODY SCREEN: NEGATIVE
UNIT DIVISION: 0
UNIT DIVISION: 0
Unit division: 0
Unit division: 0

## 2014-05-02 MED ORDER — SODIUM CHLORIDE 0.9 % IV SOLN
Freq: Once | INTRAVENOUS | Status: AC
  Administered 2014-05-02: 17:00:00 via INTRAVENOUS

## 2014-05-02 MED ORDER — CEFAZOLIN SODIUM-DEXTROSE 2-3 GM-% IV SOLR
2.0000 g | INTRAVENOUS | Status: AC
  Administered 2014-05-03: 2 g via INTRAVENOUS
  Filled 2014-05-02: qty 50

## 2014-05-02 MED ORDER — NYSTATIN 100000 UNIT/GM EX POWD
Freq: Two times a day (BID) | CUTANEOUS | Status: DC | PRN
  Filled 2014-05-02: qty 15

## 2014-05-02 NOTE — Progress Notes (Signed)
spoke with IR Plan on left percutaneous nephrostomy tube placement at 8 am tomorrow. Will arrange 2 U platelets for transfusion in am. ( 1 U just prior to procedure and another during the procedure.). NPO after midnight.  instructions given to the nurse.

## 2014-05-02 NOTE — Progress Notes (Signed)
Asked by radiology to reassess pt for Perc tube  Serum Cr is improving  Previous clot retention Recent cysto by Dr Jeffie Pollock Dr Jeffie Pollock has recommended perc as opposed to stent on 3 occasions Paged the primary care team

## 2014-05-02 NOTE — H&P (Signed)
Reason for Consult: Bilateral hydronephrosis Chief Complaint: Chief Complaint  Patient presents with  . Anemia    Referring Physician(s): Urology/[facility]  History of Present Illness: [name] is a 63 y.o. male presented with hematuria and worsening renal function. He was found to have bilateral hydronephrosis and left nephrolithiasis with acute kidney injury. He has previously had a left ureteral stent placed 08/2013 in which the patient believes he passed this. IR received request for consult. The patient also has a history of metastatic colon cancer s/p resection and chemotherapy. He states he is voiding better today. He denies any chest pain, shortness of breath or palpitations. He denies any recent fever or chills. The patient denies any history of sleep apnea or chronic oxygen use. He has previously tolerated sedation without complications.   Past Medical History  Diagnosis Date  . Pancytopenia, acquired 12/04/2010  . B12 deficiency 12/07/2010  . Kidney stones   . CAD (coronary artery disease)   . Anemia   . History of radiation therapy 06/19/07 -07/27/07    whole pelvis  . Hx of radiation therapy 08/06/12,08/09/12,& 08/13/12    rul [id number]  . Cancer     Colon ca dx 2008 surg/rad/chemo  . Lung cancer   . FH: chemotherapy   . Myocardial infarction   . Rectal bleeding 09/20/2013    From recurrent rectal mass/recurrent rectal adenocarcinoma  . Acute blood loss anemia 09/27/2013  . 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  2.7 cm primary, moderately differentiated with 3/8 positive lymph nodes without LVI.  Surgery was on 04/22/2007 and was treated postoperatively with radiation and concomitant capecitabine and then 4 cycles of a planned 6 with oxaliplatin and capecitabine.  His counts would not allow treatment of  . Hydronephrosis of left kidney 09/24/2013    Kidney stones; s/p  cystoscopy and stent  . Gross hematuria 09/25/2103  . Lung metastases 07/09/2013  . Secondary malignancy of right lung 07/09/2013    Past Surgical History  Procedure Laterality Date  . Ileostomy  12/07/2010    Procedure: ILEOSTOMY;  Surgeon: Donato Heinz;  Location: [facility];  Service: General;  Laterality: N/A;  Diverting Ileostomy Lysis of adhesions Exploratory Laparotomy  . Lung biopsy  10/27/10    rt lobe- adenocarcinoma  . Cholecystectomy    . Cataract extraction w/ intraocular lens implant  2007    bil  . Lithotripsy  2002  . Coronary angioplasty with stent placement  2003    stenting x 2  . Colon resection  2008    x2  . Esophagogastroduodenoscopy N/A 05/02/2013    Dr. Gala Romney- polypoid antral fold, bx= reactive gastropathy  . Givens capsule study N/A 05/02/2013    Procedure: GIVENS CAPSULE STUDY;  Surgeon: Daneil Dolin, MD;  Location: [facility];  Service: Endoscopy;  Laterality: N/A;  . Colonoscopy N/A 09/20/2013    SLF:A near circumferential fungating mass with friable surfaces was found in the rectum.  ACTIVELY OOZING.  CLOTS SEEN IN LUMEN AND ASPIRATED.  Multiple biopsies were performed using cold forceps.  Thermal therapy(BICAP 7 Fr 25W) was used to ATTEMPT TO control bleeding.    HEMOSTASIS NOT ACHEIVED.  Marland Kitchen Cystoscopy w/ ureteral stent placement Left 09/24/2013    Procedure: CYSTOSCOPY WITH LEFT RETROGRADE PYELOGRAM; LEFT URETERAL STENT PLACEMENT; REMOVAL OF SMALL BLADDER CALCULI;  Surgeon: Marissa Nestle, MD;  Location: [facility];  Service: Urology;  Laterality: Left;  . Flexible sigmoidoscopy N/A 09/25/2013    KGM:[id number] exophytic rectal mass most likely representing recurrent adenocarcinoma status post biopsy    Allergies: Daypro and Xeloda  Medications: Prior to Admission medications   Medication Sig Start Date End Date Taking? Authorizing Provider  amoxicillin-clavulanate (AUGMENTIN) 875-125 MG per tablet Take 1 tablet by mouth every 12 (twelve) hours. 04/18/14   Yes Historical Provider, MD  clindamycin (CLEOCIN-T) 1 % lotion Apply topically 2 (two) times daily. 04/07/14  Yes Manon Hilding Kefalas, PA-C  cyanocobalamin (,VITAMIN B-12,) 1000 MCG/ML injection Inject 1,000 mcg into the muscle every 30 (thirty) days.    Yes Historical Provider, MD  folic acid (FOLVITE) 1 MG tablet Take 1 tablet (1 mg total) by mouth daily. 04/28/14  Yes Baird Cancer, PA-C  HYDROcodone-acetaminophen (NORCO/VICODIN) 5-325 MG per tablet Take 1-2 tablets by mouth every 6 (six) hours as needed. 04/14/14  Yes Manon Hilding Kefalas, PA-C  hydrocortisone cream 1 % Apply 1 application topically 2 (two) times daily. Mix with urea cream and apply to affected areas. 04/23/14  Yes Manon Hilding Kefalas, PA-C  hydrocortisone-urea (CARMOL-HC) 1-10 % cream Apply topically 3 (three) times daily. 04/22/14  Yes Farrel Gobble, MD  lidocaine-prilocaine (EMLA) cream Apply a quarter size amount to port site 1 hour prior to chemo. Do not rub in. Cover with plastic wrap. 03/13/14  Yes Baird Cancer, PA-C  omeprazole (PRILOSEC) 20 MG capsule Take 1 capsule (20 mg total) by mouth daily. 09/28/13  Yes Rexene Alberts, MD  ondansetron (ZOFRAN) 8 MG tablet Take 1 tablet (8 mg total) by mouth 2 (two) times daily as needed for nausea or vomiting. 03/13/14  Yes Manon Hilding Kefalas, PA-C  urea (CARMOL) 10 % cream Apply topically as needed. Mix with hydrocortisone cream and apply to affected areas. 04/23/14  Yes Baird Cancer, PA-C    Family History  Problem Relation Age of Onset  . Cervical cancer Mother   . Rectal cancer Father 62  . Uterine cancer Sister     History   Social History  . Marital Status: Married    Spouse Name: N/A    Number of Children: 4  . Years of Education: N/A   Social History Main Topics  . Smoking status: Former Smoker -- 2.00 packs/day for 30 years    Types: Cigarettes, Cigars  . Smokeless tobacco: Former Systems developer    Types: Chew  . Alcohol Use: No     Comment: last drink 08/2013  .  Drug Use: No  . Sexual Activity: Not Currently   Other Topics Concern  . None   Social History Narrative    Review of Systems: A 12 point ROS discussed and pertinent positives are indicated in the HPI above.  All other systems are negative.  Review of Systems  Vital Signs: BP 123/62 mmHg  Pulse 77  Temp(Src) 98.2 F (36.8 C) (Oral)  Resp 18  Ht 5\' 10"  (1.778 m)  Wt 220 lb 7.4 oz (100 kg)  BMI 31.63 kg/m2  SpO2 100%  Physical Exam  Constitutional: He is oriented to person, place, and time. No distress.  HENT:  Head: Normocephalic and atraumatic.  Neck: No tracheal deviation present.  Cardiovascular: Normal rate and regular rhythm.   Murmur heard. Pulmonary/Chest: Effort normal and breath sounds normal. No respiratory distress. He has no wheezes. He has no rales.  Abdominal: Soft. Bowel sounds are normal. He exhibits no distension. There is no tenderness.  ileostomy intact  with brown stool contents   Neurological: He is alert and oriented to person, place, and time.  Skin: He is not diaphoretic.  B/L LE purpura  Psychiatric: He has a normal mood and affect. His behavior is normal.    Imaging: US Renal  04/21/2014   CLINICAL DATA:  Elevated serum creatinine.  EXAM: RENAL/URINARY TRACT ULTRASOUND COMPLETE  COMPARISON:  CT scan of February 24, 2014.  FINDINGS: Right Kidney:  Length: 13 cm. Moderate hydronephrosis is noted. 1.5 mm calculus is noted in lower pole collecting system 1.4 cm cyst is noted in midpole.  Left Kidney:  Length: 14.3 cm. Moderate left hydronephrosis is noted. Multiple stones are noted. 3.3 cm cyst is seen in lower pole.  Bladder:  Echogenic material is noted posteriorly within lumen of urinary bladder which may represent blood clot, but mass cannot be excluded. Ureteral jets are not visualized.  IMPRESSION: Moderate bilateral hydronephrosis and nephrocalcinosis is noted. Bilateral renal cysts are noted. Echogenic material is noted posteriorly within lumen  of urinary bladder which may represent blood clot, but neoplasm cannot be excluded and further evaluation with cystoscopy is recommended.   Electronically Signed   By: Sabino Dick M.D.   On: 04/21/2014 12:57   US Venous Img Lower Unilateral Left  04/21/2014   CLINICAL DATA:  Left lower extremity pain and edema.  EXAM: Left LOWER EXTREMITY VENOUS DOPPLER ULTRASOUND  TECHNIQUE: Gray-scale sonography with graded compression, as well as color Doppler and duplex ultrasound were performed to evaluate the lower extremity deep venous systems from the level of the common femoral vein and including the common femoral, femoral, profunda femoral, popliteal and calf veins including the posterior tibial, peroneal and gastrocnemius veins when visible. The superficial great saphenous vein was also interrogated. Spectral Doppler was utilized to evaluate flow at rest and with distal augmentation maneuvers in the common femoral, femoral and popliteal veins.  COMPARISON:  None.  FINDINGS: Contralateral Common Femoral Vein: Respiratory phasicity is normal and symmetric with the symptomatic side. No evidence of thrombus. Normal compressibility.  Common Femoral Vein: No evidence of thrombus. Normal compressibility, respiratory phasicity and response to augmentation.  Saphenofemoral Junction: No evidence of thrombus. Normal compressibility and flow on color Doppler imaging.  Profunda Femoral Vein: No evidence of thrombus. Normal compressibility and flow on color Doppler imaging.  Femoral Vein: No evidence of thrombus. Normal compressibility, respiratory phasicity and response to augmentation.  Popliteal Vein: No evidence of thrombus. Normal compressibility, respiratory phasicity and response to augmentation.  Calf Veins: No evidence of thrombus. Normal compressibility and flow on color Doppler imaging.  Superficial Great Saphenous Vein: No evidence of thrombus. Normal compressibility and flow on color Doppler imaging.  Venous Reflux:   None.  Other Findings:  None.  IMPRESSION: No evidence of deep venous thrombosis seen in left lower extremity.   Electronically Signed   By: Sabino Dick M.D.   On: 04/21/2014 12:51   Ct Renal Stone Study  04/28/2014   CLINICAL DATA:  63 year old male with left-sided abdominal pelvic pain and hematuria. Renal stent came out 2 weeks ago. History of metastatic colon cancer.  EXAM: CT ABDOMEN AND PELVIS WITHOUT CONTRAST  TECHNIQUE: Multidetector CT imaging of the abdomen and pelvis was performed following the standard protocol without IV contrast.  COMPARISON:  04/21/2014 renal ultrasound.  02/24/2014 and prior CTs.  FINDINGS: Increasing moderate to severe right hydroureteronephrosis and severe left hydroureteronephrosis identified extending into the pelvis were there is unchanged soft tissue and a retractile process along the  rectal suture line likely representing neoplasm causing urinary obstruction. Perirectal and precoccygeal/presacral stranding is unchanged.  There is high-density material within the bladder or representing blood or debris.  Multiple urinary calculi are identified including the following:  An 8 mm right lower pole calculus.  Several punctate right renal calculi.  A 10 x 15 mm calculus within the left renal pelvis.  A 1.2 x 2 cm calculus within the mid-lower left kidney.  A 0.9 x 1.2 cm calculus at the left UPJ which has migrated from the left renal pelvis since the prior CT. This calculus may be causing some degree of obstruction but the left ureter appears dilated distal to this calculus.  The blood pool is very low density compatible with anemia.  The liver is unchanged.  Mild splenomegaly again noted.  The pancreas and adrenal glands are unremarkable.  There is no evidence of free fluid, enlarged lymph nodes, Please note that parenchymal abnormalities may be missed without intravenous contrast.  The patient is status post cholecystectomy.  A moderate wide mouthed anterior right parastomal  hernia containing small bowel loops are noted.  There is no evidence of bowel obstruction, pneumoperitoneum or definite abscess.  No acute or suspicious bony abnormality is are noted.  IMPRESSION: Increasing moderate to severe right hydroureteronephrosis and severe left hydroureteronephrosis extending to the pelvis in the region of the patient's rectal suture lines/anastomosis.  0.9 x 1.2 cm calculus at the left UPJ which is migrated from the left renal pelvis since the CT dated 02/24/2014. This calculus may be causing some degree of obstruction, but the left ureter is dilated distal to this calculus.  Multiple bilateral renal calculi are otherwise unchanged.  High-density material within the bladder -question blood versus debris.  Unchanged soft tissue and stranding region of the rectal anastomosis.  Unchanged mild splenomegaly and right pelvic peristomal hernia containing small bowel. No evidence of bowel obstruction.   Electronically Signed   By: Hassan Rowan M.D.   On: 04/28/2014 18:16    Labs:  CBC:  Recent Labs  05/01/14 0551 05/01/14 1310 05/01/14 1820 05/02/14 0545  WBC 5.3 6.2 7.5 5.9  HGB 6.3* 6.7* 8.2* 7.5*  HCT 18.6* 20.4* 24.5* 22.8*  PLT 36* 37* 38* 35*    COAGS:  Recent Labs  09/10/13 1250 09/20/13 1305  INR 1.16 1.15  APTT 38*  --     BMP:  Recent Labs  04/29/14 0103 04/30/14 0553 05/01/14 0551 05/02/14 0545  NA 133* 141 143 143  K 5.9* 4.1 3.2* 3.7  CL 103 113* 111 111  CO2 8* 10* 19 21  GLUCOSE 101* 103* 113* 84  BUN 106* 80* 57* 49*  CALCIUM 7.6* 7.1* 6.8* 7.3*  CREATININE 16.63* 9.93* 5.97* 4.82*  GFRNONAA 3* 5* 9* 12*  GFRAA 3* 6* 11* 14*    LIVER FUNCTION TESTS:  Recent Labs  04/14/14 0900 04/21/14 0842 04/28/14 0851 04/30/14 0553 05/01/14 0551  BILITOT 1.0 0.7 0.6 2.2*  --   AST 33 27 39* 36  --   ALT 24 24 34 34  --   ALKPHOS 115 91 76 59  --   PROT 6.3 6.1 6.0 4.7*  --   ALBUMIN 2.8* 2.2* 2.0* 1.7* 1.8*    TUMOR MARKERS:  Recent  Labs  02/13/14 1032 03/17/14 0832 04/07/14 0927 04/14/14 0900  CEA 6.6* 8.9* 6.6* 4.6    Assessment and Plan: Hematuria s/p cystoscopy  Acute on chronic kidney disease secondary to obstruction/nephrolithiasis, Cr slowly improving-urology following  Bilateral hydronephrosis left >right Left nephrolithiasis  History of left ureteral stent placement 08/2013-stent passed per patient Request for management Scheduled for image guided left PCN with moderate sedation on 12/5 in am, npo after midnight, labs reviewed-await INR. History of metastatic colon cancer s/p resection and chemotherapy -now with purpura LE B/L Thrombocytopenia/anemia secondary to chemotherapy will receive 1 unit of plt prior to procedure tomorrow am and 1 unit of plt during procedure. Risks and Benefits discussed with the patient. All of the patient's questions were answered, patient is agreeable to proceed. Consent signed and in chart.   Thank you for this interesting consult.  I greatly enjoyed meeting [name] and look forward to participating in their care.   I spent a total of 40 minutes face to face in clinical consultation, greater than 50% of which was counseling/coordinating care  Signed: Hedy Jacob 05/02/2014, 4:14 PM

## 2014-05-02 NOTE — Progress Notes (Signed)
Dr. Hilbert Bible notified that pt had not voided since cystoscopy at shift change. Pt does not feel urge to void and bladder scan 107, then >200. Dr. Hilbert Bible advised to continue and monitor as long as pt is not experiencing any discomfort.

## 2014-05-02 NOTE — Progress Notes (Signed)
[facility] PROGRESS NOTE  [name] YTK:[id number] DOB: 1951/05/03 DOA: 04/28/2014 PCP: [name], MD   Brief narrative 63 year old male with history of metastatic colon cancer receiving chemotherapy in [place], anemia, coronary artery disease, history of hydronephrosis status post stenting in April 2015, nephrolithiasis, chronic kidney disease stage III who was sent to the ED from his hematology office. Patient reports intermittent hematuria for of about 2-3 weeks duration. Few days back he thought he passed his ureteral stent while using the bathroom. When he presented to his oncologist for chemotherapy he was found to have progressively worsened renal function. His last normal creatinine was 1.67 about one month back which worsened to 7.1 on 11/16. He was followed up again on 11/23 by his oncologist and his creatinine had worsened to 12.47. A renal ultrasound was done which showed moderate bilateral hydronephrosis with nephrocalcinosis. On 11/30 he was found to have bilateral lower extremity purpura and increased leg swellings. Blood work done showed platelets of 52,000 and creatinine of 18.4. He was also found to be in metabolic acidosis with CO2 9 with high anion gap.  A CT scan of the abdomen done in 11/30 showed increasing moderate to severe right hydronephrosis and severe left hydronephrosis. Also showed a 0.9x0.2 cm calculus at the left UPJ and unchanged multiple bilateral renal calculi  Urology was consulted and recommended admission to [facility] initially but patient was transferred [facility] for [facility] evaluation.   Assessment/Plan: Acute on chronic kidney disease stage III secondary to obstructive uropathy with severe metabolic acidosis  Seen by Dr Jeffie Pollock and requested PCN by IR, however given low hemoglobin and platelets IR recommended urology intervention rather than PCN. -AG Metabolic acidosis resolved with improvement in renal fn and bicarb drip started on 12/2.   Renal fn improving with floey placed on 11/30 -Appreciate renal consult.  -bedside cystoscopy performed by Dr Jeffie Pollock on 12/3 showing " a large amount of old and fresh clot that appears to have come from the left collecting system. There was a clot protruding from the ureteral orifice on the left which was left as is. S/p  evacuation of the bladder clots with a 19fr [place] foley. His prostate was short and minimally obstructing and the bladder wall had no evidence of cancer or stones. " Foley was removed. He reports small amount of hematuria this am but has not had UOP further. Bladder scan done earlier showed 450 cc urine. Will order repeat scan in 2 hrs and  If still has retention will order 22 Fr foley to be placed.  He recommends that most likely source of bleeding are the left ureteral and renal stones and would need a percutaneous  nephrolithotomy . plts still low. Will ask IR to evaluate again.  -   Anemia and thrombocytopenia Secondary to chemotherapy and metastatic colon cancer  received 5 u PRBC since admission . Hb of 7.5 today.Marland Kitchen  Has received 6 units PRBC during 2 weeks prior to admission.  Monitor h&h daily . Received 2 u plts on 12/2 without much improvement.    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 May transfer back to [facility] to be seen by his oncologist  Palpable purpura Noted HSP/ plantar erythrodysthesia. Reports to be due to chemotherapy Apply topical cream  Protein calorie malnutrition, severe Nutrition consult  B/l leg edema Will order IV lasix once able to urinate.   DVT prophylaxis:  SCD  -Code Status: full code family Communication:  Wife and son at bedside Disposition Plan: currently impatient   Consultants:  Dr Jeffie Pollock  Renal ( signed  off)  Procedures:  none  Antibiotics:  none  HPI/Subjective: patient seen and examined. underwent cystoscopy last evening. Feels lot better. Has not had much urine outpt since overnight   Objective: Filed Vitals:   05/02/14 0405  BP: 118/52  Pulse: 74  Temp: 98.5 F (36.9 C)  Resp: 18    Intake/Output Summary (Last 24 hours) at 05/02/14 1042 Last data filed at 05/02/14 1006  Gross per 24 hour  Intake    700 ml  Output   1550 ml  Net   -850 ml   Filed Weights   04/28/14 2258 04/29/14 0603 05/01/14 0530  Weight: 99.5 kg (219 lb 5.7 oz) 99.6 kg (219 lb 9.3 oz) 100 kg (220 lb 7.4 oz)    Exam:  General: middle aged male lying in bed appears fatigued  HEENT:  moist mucosa  Chest: Diminished bibasilar breath sounds  CVS: Normal S1 and S2, no murmurs   abdomen:soft, NT, ND, ileostomy status,   Extremities: Palpable purpura over the dorsum of bilateral feet, 1+ pitting edema bilaterally  CNS: Awake and alert, no uremic signs    Data Reviewed: Basic Metabolic Panel:  Recent Labs Lab 04/28/14 0851 04/29/14 0103 04/30/14 0553 05/01/14 0551 05/02/14 0545  NA 134* 133* 141 143 143  K 5.4* 5.9* 4.1 3.2* 3.7  CL 101 103 113* 111 111  CO2 9* 8* 10* 19 21  GLUCOSE 140* 101* 103* 113* 84  BUN 113* 106* 80* 57* 49*  CREATININE 18.42* 16.63* 9.93* 5.97* 4.82*  CALCIUM 7.9* 7.6* 7.1* 6.8* 7.3*  MG 2.5  --   --   --   --   PHOS  --   --   --  6.7*  --    Liver Function Tests:  Recent Labs Lab 04/28/14 0851 04/30/14 0553 05/01/14 0551  AST 39* 36  --   ALT 34 34  --   ALKPHOS 76 59  --   BILITOT 0.6 2.2*  --   PROT 6.0 4.7*  --   ALBUMIN 2.0* 1.7* 1.8*   No results for input(s): LIPASE, AMYLASE in the last 168 hours. No results for input(s): AMMONIA in the last 168 hours. CBC:  Recent Labs Lab 04/28/14 0851  04/29/14 1700 04/30/14 0553 04/30/14 0625 05/01/14 0551 05/01/14 1310 05/01/14 1820 05/02/14 0545  WBC 10.5  < > 7.1 7.8  --  5.3  6.2 7.5 5.9  NEUTROABS 9.5*  --  6.6  --   --   --   --   --   --   HGB 4.6*  < > 6.1* 7.8* 7.7* 6.3* 6.7* 8.2* 7.5*  HCT 15.0*  < > 18.7* 22.6* 22.6* 18.6* 20.4* 24.5* 22.8*  MCV 106.4*  < > 95.9 95.8  --  95.9 93.2 91.4 93.1  PLT 52*  < > 31* 42*  --  36* 37* 38* 35*  < > = values in this interval not displayed. Cardiac Enzymes: No results for input(s): CKTOTAL, CKMB, CKMBINDEX, TROPONINI in the last 168 hours. BNP (last 3 results) No results for input(s): PROBNP in the last 8760 hours. CBG: No results for input(s): GLUCAP in the last 168 hours.  No results found for this or any previous visit (from the past 240 hour(s)).   Studies: No results found.  Scheduled Meds: . sodium  chloride  10 mL/hr Intravenous Once  . sodium chloride   Intravenous Once  . sodium chloride   Intravenous Once  . sodium chloride   Intravenous Once  . cyanocobalamin  1,000 mcg Intramuscular Q30 days  . folic acid  1 mg Oral Daily  . hydrocortisone cream  1 application Topical BID  . pantoprazole  40 mg Oral Daily  . sodium chloride  3 mL Intravenous Q12H   Continuous Infusions:     Time spent: 25 minutes    Rosamary Boudreau, [place]  [facility] Pager [phone]. If 7PM-7AM, please contact night-coverage at [email or web address], password [facility] 05/02/2014, 10:42 AM  LOS: 4 days

## 2014-05-02 NOTE — Progress Notes (Signed)
S: Foley taken out yest and still has not urinated O:BP 118/52 mmHg  Pulse 74  Temp(Src) 98.5 F (36.9 C) (Oral)  Resp 18  Ht 5' 10"  (1.778 m)  Wt 100 kg (220 lb 7.4 oz)  BMI 31.63 kg/m2  SpO2 100%  Intake/Output Summary (Last 24 hours) at 05/02/14 1053 Last data filed at 05/02/14 1006  Gross per 24 hour  Intake    700 ml  Output   1550 ml  Net   -850 ml   Weight change:  OEU:MPNTI and alert CVS:RRR Resp: clear Abd:+BS NT Some suprapubic firmness Ext: 1+ edema NEURO:CNI OX3 no asterixis   . sodium chloride  10 mL/hr Intravenous Once  . sodium chloride   Intravenous Once  . sodium chloride   Intravenous Once  . sodium chloride   Intravenous Once  . cyanocobalamin  1,000 mcg Intramuscular Q30 days  . folic acid  1 mg Oral Daily  . hydrocortisone cream  1 application Topical BID  . pantoprazole  40 mg Oral Daily  . sodium chloride  3 mL Intravenous Q12H   No results found. BMET    Component Value Date/Time   NA 143 05/02/2014 0545   K 3.7 05/02/2014 0545   CL 111 05/02/2014 0545   CO2 21 05/02/2014 0545   GLUCOSE 84 05/02/2014 0545   BUN 49* 05/02/2014 0545   CREATININE 4.82* 05/02/2014 0545   CALCIUM 7.3* 05/02/2014 0545   GFRNONAA 12* 05/02/2014 0545   GFRAA 14* 05/02/2014 0545   CBC    Component Value Date/Time   WBC 5.9 05/02/2014 0545   RBC 2.45* 05/02/2014 0545   HGB 7.5* 05/02/2014 0545   HCT 22.8* 05/02/2014 0545   PLT 35* 05/02/2014 0545   MCV 93.1 05/02/2014 0545   MCH 30.6 05/02/2014 0545   MCHC 32.9 05/02/2014 0545   RDW 19.9* 05/02/2014 0545   LYMPHSABS 0.3* 04/29/2014 1700   MONOABS 0.2 04/29/2014 1700   EOSABS 0.1 04/29/2014 1700   BASOSABS 0.0 04/29/2014 1700     Assessment:  1. Acute renal failure sec to obstruction.  Scr cont to trend trend down 2. Nephrolithiasis 3. Met colon Ca 4. Met acidosis, improved 5. Anemia and thrombocytopenia Plan: 1. He will need foley replaced if he cannot urinate on his own 2. Note plans of  urology for Lt perc nephrolithotomy 3. I am not adding much at this time.  His renal fx should return to baseline with removal of obstruction.  Please call back if I can be of further assistance   [name] T

## 2014-05-02 NOTE — Op Note (Signed)
[name], [name]               ACCOUNT NO.:  [id number]  MEDICAL RECORD NO.:  [id number]  LOCATION:  [id number]                        FACILITY:  [facility]  PHYSICIAN:  Marshall Cork. Jeffie Pollock, M.D.    DATE OF BIRTH:  Feb 08, 1951  DATE OF PROCEDURE:  05/01/2014 DATE OF DISCHARGE:                              OPERATIVE REPORT   PROCEDURE:  Cystoscopy with evacuation of clots.  PREOPERATIVE DIAGNOSIS:  Gross hematuria with clot retention.  POSTOPERATIVE DIAGNOSIS:  Gross hematuria with clot retention.  Bleeding from the left ureter.  SURGEON:  Marshall Cork. Jeffie Pollock, M.D.  ANESTHESIA:  Local.  DRAINS:  None.  BLOOD LOSS:  50 mL.  COMPLICATIONS:  None.  INDICATIONS:  [name] is a 63 year old, white male, who was admitted earlier this week and transferred from [facility] for gross hematuria with clot retention and acute renal insufficiency with a creatinine of 18.83.  He has a left ureteral and renal stones with bilateral obstruction from the clot retention and had a Foley catheter placed for irrigation of clots.  The urine is cleared today, and he is to undergo cystoscopy for further evaluation of hematuria.  FINDINGS OF PROCEDURE:  He was given informed consent.  His genitalia was prepped with Betadine solution and he was draped with sterile towels.  His urethra was then instilled with 10 mL of 2% lidocaine jelly and a 16-French flexible scope was passed.  Inspection revealed a normal urethra.  The external sphincter was intact.  The prostatic urethra was short with bilobar hyperplasia.  Inspection of bladder was not satisfactory due to large amount of blood within the bladder.  At this point, I removed the cystoscope and inserted a 24-French Ainsworth catheter.  This was then irrigated with saline and a piston syringe until the return is clear and no further clots were retrieved.  At this point, the cystoscope was reinserted, and the bladder was inspected.  The bladder wall had no evidence  of tumors or stones.  There was some erythema from the catheter irritation.  The left ureteral orifice was somewhat laterally displaced and widely dilated with large fresh clot protruding from the orifice.  I elected not to disrupt clot for fear of provoking further bleeding.  At this point, the bladder was left partially full and inspection demonstrated no significant prostatic obstruction, so it was felt that Foley was no longer required for management critically since he had a decline in his creatinine from __________ drainage.  At this point, the procedure was completed.  There were no complications.     Marshall Cork. Jeffie Pollock, M.D.     JJW/MEDQ  D:  05/01/2014  T:  05/02/2014  Job:  [id number]

## 2014-05-03 ENCOUNTER — Inpatient Hospital Stay (HOSPITAL_COMMUNITY): Payer: Managed Care, Other (non HMO)

## 2014-05-03 LAB — BASIC METABOLIC PANEL
Anion gap: 12 (ref 5–15)
BUN: 40 mg/dL — AB (ref 6–23)
CHLORIDE: 106 meq/L (ref 96–112)
CO2: 20 mEq/L (ref 19–32)
CREATININE: 3.88 mg/dL — AB (ref 0.50–1.35)
Calcium: 7.7 mg/dL — ABNORMAL LOW (ref 8.4–10.5)
GFR calc Af Amer: 18 mL/min — ABNORMAL LOW (ref >90)
GFR calc non Af Amer: 15 mL/min — ABNORMAL LOW (ref >90)
Glucose, Bld: 80 mg/dL (ref 70–99)
Potassium: 3.7 mEq/L (ref 3.7–5.3)
Sodium: 138 mEq/L (ref 137–147)

## 2014-05-03 LAB — CBC
HEMATOCRIT: 20.9 % — AB (ref 39.0–52.0)
Hemoglobin: 7 g/dL — ABNORMAL LOW (ref 13.0–17.0)
MCH: 32 pg (ref 26.0–34.0)
MCHC: 33.5 g/dL (ref 30.0–36.0)
MCV: 95.4 fL (ref 78.0–100.0)
Platelets: 30 10*3/uL — ABNORMAL LOW (ref 150–400)
RBC: 2.19 MIL/uL — AB (ref 4.22–5.81)
RDW: 19.8 % — ABNORMAL HIGH (ref 11.5–15.5)
WBC: 5 10*3/uL (ref 4.0–10.5)

## 2014-05-03 LAB — URINALYSIS W MICROSCOPIC (NOT AT ARMC)
Bilirubin Urine: NEGATIVE
Glucose, UA: NEGATIVE mg/dL
Ketones, ur: 15 mg/dL — AB
NITRITE: NEGATIVE
PROTEIN: 100 mg/dL — AB
Specific Gravity, Urine: 1.006 (ref 1.005–1.030)
Urobilinogen, UA: 0.2 mg/dL (ref 0.0–1.0)
pH: 6 (ref 5.0–8.0)

## 2014-05-03 LAB — PROTIME-INR
INR: 1.33 (ref 0.00–1.49)
Prothrombin Time: 16.6 seconds — ABNORMAL HIGH (ref 11.6–15.2)

## 2014-05-03 LAB — PREPARE RBC (CROSSMATCH)

## 2014-05-03 MED ORDER — FENTANYL CITRATE 0.05 MG/ML IJ SOLN
INTRAMUSCULAR | Status: AC
  Filled 2014-05-03: qty 4

## 2014-05-03 MED ORDER — FUROSEMIDE 10 MG/ML IJ SOLN
40.0000 mg | Freq: Once | INTRAMUSCULAR | Status: AC
  Administered 2014-05-03: 40 mg via INTRAVENOUS
  Filled 2014-05-03: qty 4

## 2014-05-03 MED ORDER — VANCOMYCIN HCL IN DEXTROSE 1-5 GM/200ML-% IV SOLN
1000.0000 mg | INTRAVENOUS | Status: DC
  Administered 2014-05-04 (×2): 1000 mg via INTRAVENOUS
  Filled 2014-05-03 (×5): qty 200

## 2014-05-03 MED ORDER — MIDAZOLAM HCL 2 MG/2ML IJ SOLN
INTRAMUSCULAR | Status: AC | PRN
  Administered 2014-05-03: 1 mg via INTRAVENOUS
  Administered 2014-05-03: 0.5 mg via INTRAVENOUS

## 2014-05-03 MED ORDER — ACETAMINOPHEN 325 MG PO TABS
650.0000 mg | ORAL_TABLET | Freq: Four times a day (QID) | ORAL | Status: DC | PRN
  Administered 2014-05-03: 650 mg via ORAL
  Filled 2014-05-03: qty 2

## 2014-05-03 MED ORDER — MIDAZOLAM HCL 2 MG/2ML IJ SOLN
INTRAMUSCULAR | Status: AC
  Filled 2014-05-03: qty 4

## 2014-05-03 MED ORDER — SODIUM CHLORIDE 0.9 % IV SOLN
Freq: Once | INTRAVENOUS | Status: AC
  Administered 2014-05-03: 17:00:00 via INTRAVENOUS

## 2014-05-03 MED ORDER — HYDROMORPHONE HCL 2 MG/ML IJ SOLN
2.0000 mg | INTRAMUSCULAR | Status: DC | PRN
  Administered 2014-05-03: 2 mg via INTRAVENOUS
  Filled 2014-05-03: qty 1

## 2014-05-03 MED ORDER — LIDOCAINE HCL 1 % IJ SOLN
INTRAMUSCULAR | Status: AC
  Filled 2014-05-03: qty 20

## 2014-05-03 MED ORDER — SODIUM CHLORIDE 0.9 % IV SOLN
INTRAVENOUS | Status: AC | PRN
  Administered 2014-05-03: 30 mL/h via INTRAVENOUS

## 2014-05-03 MED ORDER — IOHEXOL 300 MG/ML  SOLN
50.0000 mL | Freq: Once | INTRAMUSCULAR | Status: AC | PRN
  Administered 2014-05-03: 20 mL

## 2014-05-03 MED ORDER — FENTANYL CITRATE 0.05 MG/ML IJ SOLN
INTRAMUSCULAR | Status: AC | PRN
  Administered 2014-05-03: 50 ug via INTRAVENOUS
  Administered 2014-05-03: 25 ug via INTRAVENOUS

## 2014-05-03 MED ORDER — CEFAZOLIN SODIUM-DEXTROSE 2-3 GM-% IV SOLR
INTRAVENOUS | Status: AC
  Filled 2014-05-03: qty 50

## 2014-05-03 NOTE — Progress Notes (Signed)
CRITICAL VALUE ALERT  Critical value received:  Positive gram stain of kidney with abundant SBCs with predominant PMNs, also a moderate amount of gram +cocci in pairs and chains  Date of notification:  05/03/14  Time of notification:  2123  Critical value read back: yes  Nurse who received alert:  Parthenia Ames  MD notified (1st page):  Rogue Bussing NP  Time of first page:  2139  MD notified (2nd page):  Time of second page:  Responding MD:  Rogue Bussing, NP  Time MD responded:  [phone]

## 2014-05-03 NOTE — Progress Notes (Addendum)
Patient complaining of urge to void and no urine output flowing into catheter bag. Pt bladder scanned and resulted 357ml in bladder. Irrigated 500cc flushed and removed 500cc, multiple large clots removed. Pt states he feels relieved and urine return flowing. Increased continuous irrigation rate into bladder. Dr. Clementeen Graham made aware. Urine lighter red, more free of clots.

## 2014-05-03 NOTE — Progress Notes (Addendum)
36 Dr. Clementeen Graham paged about patient's temperature increase after the 15 minute check during blood transfusion. Temp of 102.45F oral. Patient has no complaints of shortness of breath or distress. No back pain. Blood transfusion paused. Charge Architect notified. Blood transfusion paused.   5:50 PM MD paged again. Orders received to call blood bank to clarify symptoms of reaction. Santiago Glad, Blood bank clarified that "45F degree increase is a symptom and it is MD responsibility to call blood transfusion reaction". Pt's temperature increasing prior to transfusion. Order received to give patient tylenol and monitor patient closely for additional symptoms of reaction. Patient still remains without other signs/symptoms other than temperature increase. Blood bank notified Denise, Rapid Response notifed and told to contact MD for clarification to place order okay to transfuse with temperature 102.2. Blood transfusion remains paused. Rn at bedside.   6:21 PM Temp 99.5F oral. RN continues to be at bedside.   6:26 PM Temp 99.5F Oral. Dr. Clementeen Graham telephone order to place "care order instruction for okay to continue transfuse packet red blood cells with temperature of 102.45F and monitor closely". Rapid response RN at bedside and patient placed on their monitor list. RN continues to stay at bedside.    1845: temp of 101.7. Transfusion stop. Blood bank notified. IV team at bedside to stop transfusion from port-a-cath. Dr. Clementeen Graham notified. Patient denies any pain or other associated signs or symptoms. Rapid Response RN, Langley Gauss updated.   7:07 PM Urine sample obtained for blood transfusion reaction protocol.   Transfusion Reaction Report completed.

## 2014-05-03 NOTE — Progress Notes (Signed)
ANTIBIOTIC CONSULT NOTE - INITIAL  Pharmacy Consult for Vancomycin Indication: GPC in kidney fluid culture  Allergies  Allergen Reactions  . Daypro [Oxaprozin] Nausea And Vomiting  . Xeloda [Capecitabine] Other (See Comments)    Homocidal and suicidal ideations    Patient Measurements: Height: 5\' 10"  (177.8 cm) Weight: 219 lb 12.8 oz (99.7 kg) IBW/kg (Calculated) : 73   Vital Signs: Temp: 100.4 F (38 C) (12/05 2003) Temp Source: Oral (12/05 2003) BP: 100/50 mmHg (12/05 1911) Pulse Rate: 177 (12/05 1911) Intake/Output from previous day: 12/04 0701 - 12/05 0700 In: 4190.4 [I.V.:157.3; Blood:33] Out: 25956 [phone]; RJJOA:4166] Intake/Output from this shift: Total I/O In: 6000 [Other:6000] Out: 1770 [Urine:1670; Stool:100]  Labs:  Recent Labs  05/01/14 0551  05/01/14 1820 05/02/14 0545 05/03/14 0500  WBC 5.3  < > 7.5 5.9 5.0  HGB 6.3*  < > 8.2* 7.5* 7.0*  PLT 36*  < > 38* 35* 30*  CREATININE 5.97*  --   --  4.82* 3.88*  < > = values in this interval not displayed. Estimated Creatinine Clearance: 23.7 mL/min (by C-G formula based on Cr of 3.88). No results for input(s): VANCOTROUGH, VANCOPEAK, VANCORANDOM, GENTTROUGH, GENTPEAK, GENTRANDOM, TOBRATROUGH, TOBRAPEAK, TOBRARND, AMIKACINPEAK, AMIKACINTROU, AMIKACIN in the last 72 hours.   Microbiology: Recent Results (from the past 720 hour(s))  Body fluid culture     Status: None (Preliminary result)   Collection Time: 05/03/14 10:03 AM  Result Value Ref Range Status   Specimen Description FLUID LEFT KIDNEY  Final   Special Requests NONE  Final   Gram Stain   Final    ABUNDANT WBC PRESENT, PREDOMINANTLY PMN MODERATE GRAM POSITIVE COCCI IN PAIRS AND CHAINS Gram Stain Report Called to,Read Back By and Verified With: Gram Stain Report Called to,Read Back By and Verified With: Stefani Dama RN on 05/03/14 at 21:20 by Rise Mu Performed at [facility]    Culture PENDING  Incomplete   Report Status  PENDING  Incomplete    Medical History: Past Medical History  Diagnosis Date  . Pancytopenia, acquired 12/04/2010  . B12 deficiency 12/07/2010  . Kidney stones   . CAD (coronary artery disease)   . Anemia   . History of radiation therapy 06/19/07 -07/27/07    whole pelvis  . Hx of radiation therapy 08/06/12,08/09/12,& 08/13/12    rul 54GY/66fx  . Cancer     Colon ca dx 2008 surg/rad/chemo  . Lung cancer   . FH: chemotherapy   . Myocardial infarction   . Rectal bleeding 09/20/2013    From recurrent rectal mass/recurrent rectal adenocarcinoma  . Acute blood loss anemia 09/27/2013  . 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  2.7 cm primary, moderately differentiated with 3/8 positive lymph nodes without LVI.  Surgery was on 04/22/2007 and was treated postoperatively with radiation and concomitant capecitabine and then 4 cycles of a planned 6 with oxaliplatin and capecitabine.  His counts would not allow treatment of  . Hydronephrosis of left kidney 09/24/2013    Kidney stones; s/p cystoscopy and stent  . Gross hematuria 09/25/2103  . Lung metastases 07/09/2013  . Secondary malignancy of right lung 07/09/2013    Medications:  Prescriptions prior to admission  Medication Sig Dispense Refill Last Dose  . amoxicillin-clavulanate (AUGMENTIN) 875-125 MG per tablet Take 1 tablet by mouth every 12 (twelve) hours.   Past Week at Unknown time  .  clindamycin (CLEOCIN-T) 1 % lotion Apply topically 2 (two) times daily. 60 mL 1 04/27/2014 at Unknown time  . cyanocobalamin (,VITAMIN B-12,) 1000 MCG/ML injection Inject 1,000 mcg into the muscle every 30 (thirty) days.    Past Month at Unknown time  . folic acid (FOLVITE) 1 MG tablet Take 1 tablet (1 mg total) by mouth daily. 30 tablet 5 Past Week at Unknown time  . HYDROcodone-acetaminophen (NORCO/VICODIN) 5-325 MG per tablet Take 1-2 tablets by mouth every 6 (six)  hours as needed. 75 tablet 0 04/27/2014 at Unknown time  . hydrocortisone cream 1 % Apply 1 application topically 2 (two) times daily. Mix with urea cream and apply to affected areas. 30 g 5 Past Week at Unknown time  . hydrocortisone-urea (CARMOL-HC) 1-10 % cream Apply topically 3 (three) times daily. 85 g 6 Past Week at Unknown time  . lidocaine-prilocaine (EMLA) cream Apply a quarter size amount to port site 1 hour prior to chemo. Do not rub in. Cover with plastic wrap. 30 g 3 Past Week at Unknown time  . omeprazole (PRILOSEC) 20 MG capsule Take 1 capsule (20 mg total) by mouth daily. 30 capsule 2 04/27/2014 at Unknown time  . ondansetron (ZOFRAN) 8 MG tablet Take 1 tablet (8 mg total) by mouth 2 (two) times daily as needed for nausea or vomiting. 30 tablet 1 04/27/2014 at Unknown time  . urea (CARMOL) 10 % cream Apply topically as needed. Mix with hydrocortisone cream and apply to affected areas. 71 g 5 Past Week at Unknown time   Assessment: 63 year old man admitted for hematuria and worsening renal function.  PCN placed today.  Culture from fluid is showing GPC in chains.  Goal of Therapy:  Vancomycin trough level 15-20 mcg/ml  Plan:  Measure antibiotic drug levels at steady state Follow up culture results Vancomycin 1g IV q24  Monitor renal function  Candie Mile 05/03/2014,9:51 PM

## 2014-05-03 NOTE — Procedures (Signed)
R PCN Hematuria  No comp

## 2014-05-03 NOTE — Progress Notes (Signed)
[facility] PROGRESS NOTE  RANDEL HARGENS ZOX:[id number] DOB: 25-Aug-1950 DOA: 04/28/2014 PCP: [name], MD   Brief narrative 63 year old male with history of metastatic colon cancer receiving chemotherapy in [place], anemia, coronary artery disease, history of hydronephrosis status post stenting in April 2015, nephrolithiasis, chronic kidney disease stage III who was sent to the ED from his hematology office. Patient reports intermittent hematuria for of about 2-3 weeks duration. Few days back he thought he passed his ureteral stent while using the bathroom. When he presented to his oncologist for chemotherapy he was found to have progressively worsened renal function. His last normal creatinine was 1.67 about one month back which worsened to 7.1 on 11/16. He was followed up again on 11/23 by his oncologist and his creatinine had worsened to 12.47. A renal ultrasound was done which showed moderate bilateral hydronephrosis with nephrocalcinosis. On 11/30 he was found to have bilateral lower extremity purpura and increased leg swellings. Blood work done showed platelets of 52,000 and creatinine of 18.4. He was also found to be in metabolic acidosis with CO2 9 with high anion gap.  A CT scan of the abdomen done in 11/30 showed increasing moderate to severe right hydronephrosis and severe left hydronephrosis. Also showed a 0.9x0.2 cm calculus at the left UPJ and unchanged multiple bilateral renal calculi  Urology was consulted and recommended admission to [facility] initially but [facility] was transferred [facility] for [facility].   Assessment/Plan: Acute on chronic kidney disease stage III secondary to obstructive uropathy with severe metabolic acidosis  -AG Metabolic acidosis resolved with improvement in renal fn and bicarb drip .  Renal fn improving with floey placed on 11/30 -bedside cystoscopy performed by Dr Jeffie Pollock on 12/3 showing  a large amount of old and fresh clot that appears  to have come from the left collecting system. There was a clot protruding from the ureteral orifice on the left which was left as is. S/p  evacuation of the bladder clots with a 75fr Ainsworth foley. His prostate was short and minimally obstructing and the bladder wall had no evidence of cancer or stones. Foley was removed but had to be placed again on 12/4 due to urinary retention. -most likely source of bleeding are the left ureteral and renal stones,. evaluated by IR and placed rt sided PCN today.  Monitor output -   Anemia and thrombocytopenia Secondary to active hematuria and blood clots. Also contributed chemotherapy and metastatic colon cancer  received 5 u PRBC since admission . Hb of 7 today. Passed large blood clot on foley this am. Ordered another unit.Marland Kitchen   received 6 units PRBC during 2 weeks prior to admission.  Monitor h&h daily . Received 2 u plts on 12/2 and 2 u today.   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 May transfer back to [facility] to be seen by his oncologist  Palpable purpura Noted HSP/ plantar erythrodysthesia. Reports to be due to chemotherapy Apply topical cream  Protein calorie malnutrition, severe Nutrition consult  B/l leg edema Ordered IV lasix post transfusion   DVT prophylaxis: SCD  -Code Status: full code family Communication:  Wife at bedside Disposition Plan: currently impatient   Consultants:  Dr Jeffie Pollock  Renal ( signed off)  IR  Procedures:  Cystoscopy on 12/3   left percutaneous nephrostomy on 12/5  Antibiotics:  none  HPI/Subjective: patient seen  After returning from IR.  C/o left flank pain. had difficulty voiding this am. Bladder irrigated with NS and multiple large clots removed.  Objective: Filed Vitals:   05/03/14 1044  BP: 115/54  Pulse: 80  Temp: 98 F (36.7 C)  Resp:  12    Intake/Output Summary (Last 24 hours) at 05/03/14 1325 Last data filed at 05/03/14 1310  Gross per 24 hour  Intake 11620.19 ml  Output  18967 ml  Net -7346.81 ml   Filed Weights   04/29/14 0603 05/01/14 0530 05/03/14 0619  Weight: 99.6 kg (219 lb 9.3 oz) 100 kg (220 lb 7.4 oz) 99.7 kg (219 lb 12.8 oz)    Exam:  General: middle aged male lying in bed appears fatigued  HEENT:  moist mucosa  Chest: Diminished bibasilar breath sounds  CVS: Normal S1 and S2, no murmurs   abdomen:soft, NT, ND, ileostomy status, lt PCN and  with bloody urine  Extremities: Palpable purpura over the dorsum of bilateral feet, 1+ pitting edema bilaterally  CNS: Awake and alert,    Data Reviewed: Basic Metabolic Panel:  Recent Labs Lab 04/28/14 0851 04/29/14 0103 04/30/14 0553 05/01/14 0551 05/02/14 0545 05/03/14 0500  NA 134* 133* 141 143 143 138  K 5.4* 5.9* 4.1 3.2* 3.7 3.7  CL 101 103 113* 111 111 106  CO2 9* 8* 10* 19 21 20   GLUCOSE 140* 101* 103* 113* 84 80  BUN 113* 106* 80* 57* 49* 40*  CREATININE 18.42* 16.63* 9.93* 5.97* 4.82* 3.88*  CALCIUM 7.9* 7.6* 7.1* 6.8* 7.3* 7.7*  MG 2.5  --   --   --   --   --   PHOS  --   --   --  6.7*  --   --    Liver Function Tests:  Recent Labs Lab 04/28/14 0851 04/30/14 0553 05/01/14 0551  AST 39* 36  --   ALT 34 34  --   ALKPHOS 76 59  --   BILITOT 0.6 2.2*  --   PROT 6.0 4.7*  --   ALBUMIN 2.0* 1.7* 1.8*   No results for input(s): LIPASE, AMYLASE in the last 168 hours. No results for input(s): AMMONIA in the last 168 hours. CBC:  Recent Labs Lab 04/28/14 0851  04/29/14 1700  05/01/14 0551 05/01/14 1310 05/01/14 1820 05/02/14 0545 05/03/14 0500  WBC 10.5  < > 7.1  < > 5.3 6.2 7.5 5.9 5.0  NEUTROABS 9.5*  --  6.6  --   --   --   --   --   --   HGB 4.6*  < > 6.1*  < > 6.3* 6.7* 8.2* 7.5* 7.0*  HCT 15.0*  < > 18.7*  < > 18.6* 20.4* 24.5* 22.8* 20.9*  MCV 106.4*  < > 95.9  < > 95.9 93.2 91.4 93.1 95.4  PLT 52*  <  > 31*  < > 36* 37* 38* 35* 30*  < > = values in this interval not displayed. Cardiac Enzymes: No results for input(s): CKTOTAL, CKMB, CKMBINDEX, TROPONINI in the last 168 hours. BNP (last 3 results) No results for input(s): PROBNP in the last 8760 hours. CBG: No results for input(s): GLUCAP in the last 168 hours.  No results found for this or any previous visit (from the past 240 hour(s)).   Studies: Ir Perc Nephrostomy Left  05/03/2014   CLINICAL DATA:  Left hydronephrosis and ureteral obstruction  EXAM: PERC NEPHROSTOMY*L*; IR ULTRASOUND GUIDANCE  FLUOROSCOPY TIME:  2 min and 42 seconds.  MEDICATIONS AND MEDICAL HISTORY: Versed 1.5 mg, Fentanyl 75 mcg.  As antibiotic prophylaxis, Ancef was ordered pre-procedure and administered intravenously within one hour of incision.  ANESTHESIA/SEDATION: Moderate sedation time: 15 minutes  CONTRAST:  10 cc Omnipaque 300  PROCEDURE: The procedure, risks, benefits, and alternatives were explained to the patient. Questions regarding the procedure were encouraged and answered. The patient understands and consents to the procedure.  The back was prepped with Betadine in a sterile fashion, and a sterile drape was applied covering the operative field. A sterile gown and sterile gloves were used for the procedure.  Under sonographic guidance, a 22 gauge Chiba needle was inserted into a posterior lower pole calyx. Contrast was injected. It was removed over a 018 wire which was up sized to a 3 J. A 10 French dilator followed by a 14 French nephrostomy was advanced over the wire and coiled in the renal pelvis. Contrast was injected. It was sewn to the skin.  FINDINGS: Images document access into the left kidney collecting system via posterior lower pole calices. The final image demonstrates a 10 French nephrostomy coiled in the left renal pelvis.  COMPLICATIONS: None  IMPRESSION: Successful left nephrostomy.   Electronically Signed   By: Maryclare Bean M.D.   On: 05/03/2014 12:19    Ir US Guide Bx Asp/drain  05/03/2014   CLINICAL DATA:  Left hydronephrosis and ureteral obstruction  EXAM: PERC NEPHROSTOMY*L*; IR ULTRASOUND GUIDANCE  FLUOROSCOPY TIME:  2 min and 42 seconds.  MEDICATIONS AND MEDICAL HISTORY: Versed 1.5 mg, Fentanyl 75 mcg.  As antibiotic prophylaxis, Ancef was ordered pre-procedure and administered intravenously within one hour of incision.  ANESTHESIA/SEDATION: Moderate sedation time: 15 minutes  CONTRAST:  10 cc Omnipaque 300  PROCEDURE: The procedure, risks, benefits, and alternatives were explained to the patient. Questions regarding the procedure were encouraged and answered. The patient understands and consents to the procedure.  The back was prepped with Betadine in a sterile fashion, and a sterile drape was applied covering the operative field. A sterile gown and sterile gloves were used for the procedure.  Under sonographic guidance, a 22 gauge Chiba needle was inserted into a posterior lower pole calyx. Contrast was injected. It was removed over a 018 wire which was up sized to a 3 J. A 10 French dilator followed by a 84 French nephrostomy was advanced over the wire and coiled in the renal pelvis. Contrast was injected. It was sewn to the skin.  FINDINGS: Images document access into the left kidney collecting system via posterior lower pole calices. The final image demonstrates a 10 French nephrostomy coiled in the left renal pelvis.  COMPLICATIONS: None  IMPRESSION: Successful left nephrostomy.   Electronically Signed   By: Maryclare Bean M.D.   On: 05/03/2014 12:19    Scheduled Meds: . sodium chloride  10 mL/hr Intravenous Once  . sodium chloride   Intravenous Once  . sodium chloride   Intravenous Once  . sodium chloride   Intravenous Once  . sodium chloride   Intravenous Once  . ceFAZolin      . cyanocobalamin  1,000 mcg Intramuscular Q30 days  . fentaNYL      . folic acid  1 mg Oral Daily  . furosemide  40 mg Intravenous Once  . hydrocortisone cream  1  application Topical BID  . lidocaine      . midazolam      . pantoprazole  40 mg Oral Daily  . sodium chloride  3  mL Intravenous Q12H   Continuous Infusions:     Time spent: 25 minutes    Sruti Ayllon, [place]  [facility] Pager [phone]. If 7PM-7AM, please contact night-coverage at [email or web address], password [facility] 05/03/2014, 1:25 PM  LOS: 5 days

## 2014-05-03 NOTE — Plan of Care (Signed)
Problem: Phase I Progression Outcomes Goal: Voiding-avoid urinary catheter unless indicated Outcome: Progressing Goal: Hemodynamically stable Outcome: Progressing

## 2014-05-03 NOTE — Progress Notes (Signed)
Dr. Clementeen Graham made aware of patient's vital signs, temp 99.18F oral, BP 90/55, HR 125 prior to blood administration. No new orders received.

## 2014-05-03 NOTE — Progress Notes (Signed)
Blood Transfusion full set given to blood bank. Urine sample given to blood bank. Urine sample red, monitoring patient's urine continuously all day and this was the lightest red it has been all day. Copy of blood slip provided to blood bank.

## 2014-05-04 DIAGNOSIS — R310 Gross hematuria: Secondary | ICD-10-CM

## 2014-05-04 DIAGNOSIS — N189 Chronic kidney disease, unspecified: Secondary | ICD-10-CM

## 2014-05-04 DIAGNOSIS — C7801 Secondary malignant neoplasm of right lung: Secondary | ICD-10-CM

## 2014-05-04 DIAGNOSIS — A419 Sepsis, unspecified organism: Secondary | ICD-10-CM

## 2014-05-04 DIAGNOSIS — K7460 Unspecified cirrhosis of liver: Secondary | ICD-10-CM

## 2014-05-04 LAB — PREPARE PLATELET PHERESIS
Unit division: 0
Unit division: 0

## 2014-05-04 LAB — BASIC METABOLIC PANEL
Anion gap: 11 (ref 5–15)
BUN: 36 mg/dL — AB (ref 6–23)
CHLORIDE: 106 meq/L (ref 96–112)
CO2: 20 meq/L (ref 19–32)
CREATININE: 3.22 mg/dL — AB (ref 0.50–1.35)
Calcium: 7.5 mg/dL — ABNORMAL LOW (ref 8.4–10.5)
GFR calc non Af Amer: 19 mL/min — ABNORMAL LOW (ref >90)
GFR, EST AFRICAN AMERICAN: 22 mL/min — AB (ref >90)
Glucose, Bld: 153 mg/dL — ABNORMAL HIGH (ref 70–99)
POTASSIUM: 3.5 meq/L — AB (ref 3.7–5.3)
Sodium: 137 mEq/L (ref 137–147)

## 2014-05-04 LAB — CBC
HCT: 16.9 % — ABNORMAL LOW (ref 39.0–52.0)
Hemoglobin: 5.5 g/dL — CL (ref 13.0–17.0)
MCH: 31.4 pg (ref 26.0–34.0)
MCHC: 32.5 g/dL (ref 30.0–36.0)
MCV: 96.6 fL (ref 78.0–100.0)
Platelets: 31 10*3/uL — ABNORMAL LOW (ref 150–400)
RBC: 1.75 MIL/uL — AB (ref 4.22–5.81)
RDW: 19.7 % — AB (ref 11.5–15.5)
WBC: 6.4 10*3/uL (ref 4.0–10.5)

## 2014-05-04 LAB — PREPARE RBC (CROSSMATCH)

## 2014-05-04 LAB — GLUCOSE, CAPILLARY
GLUCOSE-CAPILLARY: 118 mg/dL — AB (ref 70–99)
Glucose-Capillary: 138 mg/dL — ABNORMAL HIGH (ref 70–99)

## 2014-05-04 LAB — MRSA PCR SCREENING: MRSA BY PCR: NEGATIVE

## 2014-05-04 MED ORDER — METHYLPREDNISOLONE SODIUM SUCC 125 MG IJ SOLR
80.0000 mg | Freq: Once | INTRAMUSCULAR | Status: AC
  Administered 2014-05-04: 80 mg via INTRAVENOUS
  Filled 2014-05-04: qty 1.28

## 2014-05-04 MED ORDER — SODIUM CHLORIDE 0.9 % IV SOLN
Freq: Once | INTRAVENOUS | Status: AC
  Administered 2014-05-04: 19:00:00 via INTRAVENOUS

## 2014-05-04 MED ORDER — SODIUM CHLORIDE 0.9 % IV BOLUS (SEPSIS)
500.0000 mL | Freq: Once | INTRAVENOUS | Status: AC
  Administered 2014-05-04: 500 mL via INTRAVENOUS

## 2014-05-04 MED ORDER — SODIUM CHLORIDE 0.9 % IV SOLN
Freq: Once | INTRAVENOUS | Status: AC
  Administered 2014-05-04: 11:00:00 via INTRAVENOUS

## 2014-05-04 MED ORDER — ACETAMINOPHEN 325 MG PO TABS
650.0000 mg | ORAL_TABLET | Freq: Once | ORAL | Status: AC
  Administered 2014-05-04: 650 mg via ORAL
  Filled 2014-05-04: qty 2

## 2014-05-04 MED ORDER — HYDROMORPHONE HCL 1 MG/ML IJ SOLN
2.0000 mg | INTRAMUSCULAR | Status: DC | PRN
  Administered 2014-05-04 – 2014-05-18 (×13): 2 mg via INTRAVENOUS
  Filled 2014-05-04 (×15): qty 2

## 2014-05-04 MED ORDER — DIPHENHYDRAMINE HCL 50 MG/ML IJ SOLN
25.0000 mg | Freq: Once | INTRAMUSCULAR | Status: AC
  Administered 2014-05-04: 25 mg via INTRAVENOUS
  Filled 2014-05-04: qty 1

## 2014-05-04 NOTE — Consult Note (Signed)
Referral MD  Reason for Referral: + Metastatic colon cancer with gross hematuria and thrombocytopenia  Chief Complaint  Patient presents with  . Anemia  : Transfer from [facility]  HPI: [name] is a [name]. He has metastatic adenocarcinoma of the rectum. He's been treated up at [facility]. He recently has been on Erbitux.  His metastatic disease area and he's had radiation therapy.  He has cirrhosis. This likely is from past chemotherapy. He's had marked thrombocytopenia.  He recently was seen at [facility]. His posterior get chemotherapy. However, he was not feeling well. He's having a lot of abdominal pain. He says that a stent that was put in him was coming out.  He was admitted area and he is having gross hematuria. He'll be getting transfused quite often.  He was then transferred down to come Hospital. He is on the hospitalist service. He's been getting transfused. He had a hemoglobin of 5.5. He has chronic renal insufficiency. Creatinine was 3.22. His platelet count was 31,000. Again, he's been transfused quite often.  his pro time and PTT have been okay.  Urology is seeing him. Radiology placed a nephrostomy tube on the left side on Saturday. He is having issues bladder irrigation.  He is not felt to be a candidate for actual chemotherapy because of the thrombocytopenia.      Past Medical History  Diagnosis Date  . Pancytopenia, acquired 12/04/2010  . B12 deficiency 12/07/2010  . Kidney stones   . CAD (coronary artery disease)   . Anemia   . History of radiation therapy 06/19/07 -07/27/07    whole pelvis  . Hx of radiation therapy 08/06/12,08/09/12,& 08/13/12    rul [id number]  . Cancer     Colon ca dx 2008 surg/rad/chemo  . Lung cancer   . FH: chemotherapy   . Myocardial infarction   . Rectal bleeding 09/20/2013    From recurrent rectal mass/recurrent rectal adenocarcinoma  . Acute blood loss anemia 09/27/2013  . Adenocarcinoma of  rectum 07/07/2010    Qualifier: History of  By: Ronnald Ramp FNP-BC, Kandice L  Initially presented with Stage III adeno of colon.  2.7 cm primary, moderately differentiated with 3/8 positive lymph nodes without LVI.  Surgery was on 04/22/2007 and was treated postoperatively with radiation and concomitant capecitabine and then 4 cycles of a planned 6 with oxaliplatin and capecitabine.  His counts would not allow treatment of  . Hydronephrosis of left kidney 09/24/2013    Kidney stones; s/p cystoscopy and stent  . Gross hematuria 09/25/2103  . Lung metastases 07/09/2013  . Secondary malignancy of right lung 07/09/2013  :  Past Surgical History  Procedure Laterality Date  . Ileostomy  12/07/2010    Procedure: ILEOSTOMY;  Surgeon: Donato Heinz;  Location: [facility] ORS;  Service: General;  Laterality: N/A;  Diverting Ileostomy Lysis of adhesions Exploratory Laparotomy  . Lung biopsy  10/27/10    rt lobe- adenocarcinoma  . Cholecystectomy    . Cataract extraction w/ intraocular lens implant  2007    bil  . Lithotripsy  2002  . Coronary angioplasty with stent placement  2003    stenting x 2  . Colon resection  2008    x2  . Esophagogastroduodenoscopy N/A 05/02/2013    Dr. Gala Romney- polypoid antral fold, bx= reactive gastropathy  . Givens capsule study N/A 05/02/2013    Procedure: GIVENS CAPSULE STUDY;  Surgeon: Daneil Dolin, MD;  Location: [facility] ENDO SUITE;  Service:  Endoscopy;  Laterality: N/A;  . Colonoscopy N/A 09/20/2013    SLF:A near circumferential fungating mass with friable surfaces was found in the rectum.  ACTIVELY OOZING.  CLOTS SEEN IN LUMEN AND ASPIRATED.  Multiple biopsies were performed using cold forceps.  Thermal therapy(BICAP 7 Fr 25W) was used to ATTEMPT TO control bleeding.    HEMOSTASIS NOT ACHEIVED.  Marland Kitchen Cystoscopy w/ ureteral stent placement Left 09/24/2013    Procedure: CYSTOSCOPY WITH LEFT RETROGRADE PYELOGRAM; LEFT URETERAL STENT PLACEMENT; REMOVAL OF SMALL BLADDER CALCULI;  Surgeon: Marissa Nestle, MD;  Location: [facility] ORS;  Service: Urology;  Laterality: Left;  . Flexible sigmoidoscopy N/A 09/25/2013    WUJ:[id number] exophytic rectal mass most likely representing recurrent adenocarcinoma status post biopsy  :  Current facility-administered medications: 0.9 %  sodium chloride infusion, 10 mL/hr, Intravenous, Once, Carmin Muskrat, MD;  0.9 %  sodium chloride infusion, , Intravenous, Once, Jessica U Vann, DO;  0.9 %  sodium chloride infusion, , Intravenous, Once, Nishant Dhungel, MD;  0.9 %  sodium chloride infusion, , Intravenous, Once, Nishant Dhungel, MD, Last Rate: 10 mL/hr at 05/02/14 2300 0.9 %  sodium chloride infusion, , Intravenous, Once, Volanda Napoleon, MD;  acetaminophen (TYLENOL) tablet 650 mg, 650 mg, Oral, Q6H PRN, Louellen Molder, MD, 650 mg at 05/03/14 1810;  acetaminophen (TYLENOL) tablet 650 mg, 650 mg, Oral, Once, Volanda Napoleon, MD;  cyanocobalamin ((VITAMIN B-12)) injection 1,000 mcg, 1,000 mcg, Intramuscular, Q30 days, Shanda Howells, MD, 1,000 mcg at 04/29/14 9562 diphenhydrAMINE (BENADRYL) injection 25 mg, 25 mg, Intravenous, Once, Volanda Napoleon, MD;  folic acid (FOLVITE) tablet 1 mg, 1 mg, Oral, Daily, Shanda Howells, MD, 1 mg at 05/04/14 0932;  HYDROcodone-acetaminophen (NORCO/VICODIN) 5-325 MG per tablet 1-2 tablet, 1-2 tablet, Oral, Q6H PRN, Shanda Howells, MD, 2 tablet at 05/04/14 0441 hydrocortisone cream 1 % 1 application, 1 application, Topical, BID, Shanda Howells, MD, 1 application at 13/08/65 0932;  hydrocortisone cream 1 %, , Topical, TID PRN, Carmin Muskrat, MD;  HYDROmorphone (DILAUDID) injection 2 mg, 2 mg, Intravenous, Q4H PRN, Nishant Dhungel, MD;  methylPREDNISolone sodium succinate (SOLU-MEDROL) 125 mg/2 mL injection 80 mg, 80 mg, Intravenous, Once, Volanda Napoleon, MD nystatin (MYCOSTATIN/NYSTOP) topical powder, , Topical, BID PRN, Nishant Dhungel, MD;  ondansetron (ZOFRAN) tablet 8 mg, 8 mg, Oral, BID PRN, Shanda Howells, MD;  pantoprazole (PROTONIX) EC  tablet 40 mg, 40 mg, Oral, Daily, Shanda Howells, MD, 40 mg at 05/04/14 0932;  sodium chloride 0.9 % injection 10-40 mL, 10-40 mL, Intracatheter, PRN, Nishant Dhungel, MD, 10 mL at 05/03/14 0450 sodium chloride 0.9 % injection 3 mL, 3 mL, Intravenous, Q12H, Shanda Howells, MD, 3 mL at 05/04/14 0932;  vancomycin (VANCOCIN) IVPB 1000 mg/200 mL premix, 1,000 mg, Intravenous, Q24H, Nishant Dhungel, MD, 1,000 mg at 05/04/14 0018 Facility-Administered Medications Ordered in Other Encounters: 0.9 %  sodium chloride infusion, , Intravenous, Continuous, Pieter Partridge, MD, Last Rate: 20 mL/hr at 07/17/12 1113;  heparin lock flush 100 unit/mL, 500 Units, Intravenous, Once, Pieter Partridge, MD;  sodium chloride 0.9 % injection 10 mL, 10 mL, Intravenous, PRN, Pieter Partridge, MD, 10 mL at 07/17/12 1113:  . sodium chloride  10 mL/hr Intravenous Once  . sodium chloride   Intravenous Once  . sodium chloride   Intravenous Once  . sodium chloride   Intravenous Once  . sodium chloride   Intravenous Once  . acetaminophen  650 mg Oral Once  . cyanocobalamin  1,000 mcg Intramuscular Q30 days  .  diphenhydrAMINE  25 mg Intravenous Once  . folic acid  1 mg Oral Daily  . hydrocortisone cream  1 application Topical BID  . methylPREDNISolone (SOLU-MEDROL) injection  80 mg Intravenous Once  . pantoprazole  40 mg Oral Daily  . sodium chloride  3 mL Intravenous Q12H  . vancomycin  1,000 mg Intravenous Q24H  :  Allergies  Allergen Reactions  . Daypro [Oxaprozin] Nausea And Vomiting  . Xeloda [Capecitabine] Other (See Comments)    Homocidal and suicidal ideations  :  Family History  Problem Relation Age of Onset  . Cervical cancer Mother   . Rectal cancer Father 4  . Uterine cancer Sister   :  History   Social History  . Marital Status: Married    Spouse Name: N/A    Number of Children: 4  . Years of Education: N/A   Occupational History  . Not on file.   Social History Main Topics  . Smoking  status: Former Smoker -- 2.00 packs/day for 30 years    Types: Cigarettes, Cigars  . Smokeless tobacco: Former Systems developer    Types: Chew  . Alcohol Use: No     Comment: last drink 08/2013  . Drug Use: No  . Sexual Activity: Not Currently   Other Topics Concern  . Not on file   Social History Narrative  :  Pertinent items are noted in HPI.  Exam: Patient Vitals for the past 24 hrs:  BP Temp Temp src Pulse Resp SpO2 Height Weight  05/04/14 1430 (!) 118/56 mmHg 98.9 F (37.2 C) Oral 75 19 100 % - -  05/04/14 1400 (!) 107/46 mmHg 98.9 F (37.2 C) Oral 68 12 100 % - -  05/04/14 1330 (!) 107/51 mmHg - - 69 16 100 % - -  05/04/14 1315 (!) 106/54 mmHg - - 66 12 100 % - -  05/04/14 1300 (!) 109/50 mmHg 98.4 F (36.9 C) Oral 73 10 100 % - -  05/04/14 1245 (!) 101/49 mmHg 99 F (37.2 C) Oral 70 11 100 % - -  05/04/14 1230 (!) 112/50 mmHg - - 67 13 100 % - -  05/04/14 1215 (!) 106/58 mmHg 97.9 F (36.6 C) Oral 71 13 100 % - -  05/04/14 1200 (!) 108/57 mmHg - - 74 16 100 % - -  05/04/14 1145 (!) 105/48 mmHg - - 75 14 100 % - -  05/04/14 1130 (!) 107/49 mmHg - - 74 11 100 % - -  05/04/14 1115 (!) 108/46 mmHg 98.6 F (37 C) Oral 77 14 100 % - -  05/04/14 1100 (!) 105/44 mmHg 98.5 F (36.9 C) Oral 77 12 100 % - -  05/04/14 1045 (!) 103/46 mmHg - - 81 13 100 % - -  05/04/14 1030 - 98.9 F (37.2 C) Oral - 12 - 5\' 10"  (1.778 m) 215 lb 13.3 oz (97.9 kg)  05/04/14 0513 - 98.7 F (37.1 C) Oral 80 - 99 % - 218 lb 11.2 oz (99.202 kg)  05/04/14 0441 (!) 100/50 mmHg - - - - - - -  05/04/14 0314 (!) 88/45 mmHg - - - - - - -  05/04/14 0157 (!) 92/47 mmHg 98.7 F (37.1 C) Oral 85 13 99 % - -  05/03/14 2226 (!) 90/40 mmHg 98.8 F (37.1 C) Oral 95 12 100 % - -  05/03/14 2003 - (!) 100.4 F (38 C) Oral - - - - -  05/03/14 1911 (!) 100/50 mmHg Marland Kitchen)  102 F (38.9 C) Oral (!) 177 - - - -  05/03/14 1901 - (!) 100.5 F (38.1 C) - - - - - -  05/03/14 1900 (!) 96/50 mmHg - - (!) 119 16 99 % - -  05/03/14  1845 (!) 105/50 mmHg (!) 101.7 F (38.7 C) Oral (!) 119 16 99 % - -  05/03/14 1830 - 99.3 F (37.4 C) Oral - - - - -  05/03/14 1823 (!) 110/50 mmHg - - - - - - -  05/03/14 1821 - 99.3 F (37.4 C) Oral - - - - -  05/03/14 1813 (!) 101/49 mmHg - - (!) 117 - - - -  05/03/14 1751 (!) 105/49 mmHg - - (!) 120 - 98 % - -  05/03/14 1742 (!) 115/50 mmHg (!) 102.2 F (39 C) Oral (!) 119 16 100 % - -  05/03/14 1708 (!) 90/55 mmHg 99.7 F (37.6 C) Oral (!) 125 14 99 % - -   well-developed and well-nourished white [place]. Vital signs show a temperature of 98.9. Pulse 75. Blood pressure is 118/56. Weight is 215 pounds. Head and neck exam shows no ocular or oral lesions. He has no palpable cervical or supraclavicular lymph nodes. Lungs are clear. Cardiac exam regular in rhythm. Abdomen is soft. He is somewhat obese. He has no fluid wave. There is some tenderness over the left side. He has no obvious abdominal mass. Extremities shows some chronic 1+ edema in his legs.    Recent Labs  05/03/14 0500 05/04/14 0608  WBC 5.0 6.4  HGB 7.0* 5.5*  HCT 20.9* 16.9*  PLT 30* 31*    Recent Labs  05/03/14 0500 05/04/14 0608  NA 138 137  K 3.7 3.5*  CL 106 106  CO2 20 20  GLUCOSE 80 153*  BUN 40* 36*  CREATININE 3.88* 3.22*  CALCIUM 7.7* 7.5*    Blood smear reviNone   Pathology:None     Assessment and Plan: [name] is a 63 year old gentleman. He has metastatic rectal cancer. He is on Erbitux. He has hematuria. He has thrombocytopenia. He has renal insufficiency.  I think he definitely needs a platelet transfusion. I think that his platelets are just not working well. He has renal insufficiency which causes some degree of platelet dysfunction. As such, 2 units of platelets would be appropriate.  I suspect he may need to have some IV iron. It is bleeding this much, I'm sure that he will lose blood.  He is quite happy with the care that he is getting at [facility]. We will let them  deal with his metastatic disease once he leaves the hospital.  Again, the thrombocytopenia will not be correctable on its own. Again, from what the notes [facility] for [facility], he has cirrhosis. He may have developed cirrhosis from all the chemotherapy that he has had in the past.  We will be more than happy to follow along. I think that there is not much that we really can do. I think it is a matter of transfusion support.  I spent about 1 hour with him.

## 2014-05-04 NOTE — Progress Notes (Signed)
CRITICAL VALUE ALERT  Critical value received:  Hgb 5.5  Date of notification:  05/04/14  Time of notification:  0706  Critical value read back: yes Nurse who received alert:  Nakkia Mackiewicz, Josephine Cables  MD notified (1st page):  Dhungel  Time of first page:  0707  MD notified (2nd page):  Time of second page:  Responding MD:  Dhungel  Time MD responded:  [phone]

## 2014-05-04 NOTE — Progress Notes (Signed)
MD notified that patient has a BP of 88/45 taken manually.  Patient complaining of pain at surgical site.  MD ordered bolus; then if bp comes up to patient baseline pain medicine may be given. BP was 100/50 after bolus and pain meds given.  Will continue to monitor patient.

## 2014-05-04 NOTE — Progress Notes (Signed)
Subjective: Pt feeling ok. Being moved to ICU for closer monitoring and continuous bladder irrigation. S/p (L)PCN yesterday. Reports some soreness at drain site  Objective: Physical Exam: BP 103/46 mmHg  Pulse 81  Temp(Src) 98.9 F (37.2 C) (Oral)  Resp 13  Ht 5\' 10"  (1.778 m)  Wt 215 lb 13.3 oz (97.9 kg)  BMI 30.97 kg/m2  SpO2 100% A&O x 3 (L)PCN intact, site clean, mildly tender 776mL output recorded, about 12mL in bag this am. Thin bloody urine, few clots.   Labs: CBC  Recent Labs  05/03/14 0500 05/04/14 0608  WBC 5.0 6.4  HGB 7.0* 5.5*  HCT 20.9* 16.9*  PLT 30* 31*   BMET  Recent Labs  05/03/14 0500 05/04/14 0608  NA 138 137  K 3.7 3.5*  CL 106 106  CO2 20 20  GLUCOSE 80 153*  BUN 40* 36*  CREATININE 3.88* 3.22*  CALCIUM 7.7* 7.5*   LFT No results for input(s): PROT, ALBUMIN, AST, ALT, ALKPHOS, BILITOT, BILIDIR, IBILI, LIPASE in the last 72 hours. PT/INR  Recent Labs  05/03/14 0500  LABPROT 16.6*  INR 1.33     Studies/Results: Ir Perc Nephrostomy Left  05/03/2014   CLINICAL DATA:  Left hydronephrosis and ureteral obstruction  EXAM: PERC NEPHROSTOMY*L*; IR ULTRASOUND GUIDANCE  FLUOROSCOPY TIME:  2 min and 42 seconds.  MEDICATIONS AND MEDICAL HISTORY: Versed 1.5 mg, Fentanyl 75 mcg.  As antibiotic prophylaxis, Ancef was ordered pre-procedure and administered intravenously within one hour of incision.  ANESTHESIA/SEDATION: Moderate sedation time: 15 minutes  CONTRAST:  10 cc Omnipaque 300  PROCEDURE: The procedure, risks, benefits, and alternatives were explained to the patient. Questions regarding the procedure were encouraged and answered. The patient understands and consents to the procedure.  The back was prepped with Betadine in a sterile fashion, and a sterile drape was applied covering the operative field. A sterile gown and sterile gloves were used for the procedure.  Under sonographic guidance, a 22 gauge Chiba needle was inserted into a  posterior lower pole calyx. Contrast was injected. It was removed over a 018 wire which was up sized to a 3 J. A 10 French dilator followed by a 65 French nephrostomy was advanced over the wire and coiled in the renal pelvis. Contrast was injected. It was sewn to the skin.  FINDINGS: Images document access into the left kidney collecting system via posterior lower pole calices. The final image demonstrates a 10 French nephrostomy coiled in the left renal pelvis.  COMPLICATIONS: None  IMPRESSION: Successful left nephrostomy.   Electronically Signed   By: Maryclare Bean M.D.   On: 05/03/2014 12:19   Ir US Guide Bx Asp/drain  05/03/2014   CLINICAL DATA:  Left hydronephrosis and ureteral obstruction  EXAM: PERC NEPHROSTOMY*L*; IR ULTRASOUND GUIDANCE  FLUOROSCOPY TIME:  2 min and 42 seconds.  MEDICATIONS AND MEDICAL HISTORY: Versed 1.5 mg, Fentanyl 75 mcg.  As antibiotic prophylaxis, Ancef was ordered pre-procedure and administered intravenously within one hour of incision.  ANESTHESIA/SEDATION: Moderate sedation time: 15 minutes  CONTRAST:  10 cc Omnipaque 300  PROCEDURE: The procedure, risks, benefits, and alternatives were explained to the patient. Questions regarding the procedure were encouraged and answered. The patient understands and consents to the procedure.  The back was prepped with Betadine in a sterile fashion, and a sterile drape was applied covering the operative field. A sterile gown and sterile gloves were used for the procedure.  Under sonographic guidance, a 22 gauge Chiba needle was inserted  into a posterior lower pole calyx. Contrast was injected. It was removed over a 018 wire which was up sized to a 3 J. A 10 French dilator followed by a 92 French nephrostomy was advanced over the wire and coiled in the renal pelvis. Contrast was injected. It was sewn to the skin.  FINDINGS: Images document access into the left kidney collecting system via posterior lower pole calices. The final image demonstrates a  10 French nephrostomy coiled in the left renal pelvis.  COMPLICATIONS: None  IMPRESSION: Successful left nephrostomy.   Electronically Signed   By: Maryclare Bean M.D.   On: 05/03/2014 12:19    Assessment/Plan: (L)hydronephrosis from obstructing stone. S/p (L)PCN with bloody but good output Cr down to 3.2 IR following along with Uro    LOS: 6 days    Ascencion Dike PA-C 05/04/2014 10:53 AM

## 2014-05-04 NOTE — Progress Notes (Signed)
Subjective:  1 - Gross Hematuria with Anemia, Thrombocytopenia - Pt with new gross hematruia with clots prompting cysto, clot-eval by Jeffie Pollock 12/4 at which point felt likely blood from left ureteral stone in setting of thrombocytopenia as formed clot at left UO, 39F Ainesworth foley placed and then on NS CBI.    2 - Left Ureteral Stone - Left ureteral stone with L>Rt Hydro on admission. Now s/p left neph tube for drainage 12/5. Long h/o nephrolithiasis  3 - Acute Renal Failure - Cr 18 on admission, now <4 with hydration and left neph tube.   4 - Bacteruria - GPC noted form left nephrostomy fluid 12/5, CX pending, placed on empiric Vanc per primary team.   Today Saifullah is seen in f/u above and re-evaluate bladder irrigation. Hgb continues to fall some, plts remain quite low. Gram stain of left kidney fluid with GPC's.   Objective: Vital signs in last 24 hours: Temp:  [97.8 F (36.6 C)-102.2 F (39 C)] 98.7 F (37.1 C) (12/06 0513) Pulse Rate:  [80-177] 80 (12/06 0513) Resp:  [12-18] 13 (12/06 0157) BP: (88-148)/(40-71) 100/50 mmHg (12/06 0441) SpO2:  [98 %-100 %] 99 % (12/06 0513) Weight:  [99.202 kg (218 lb 11.2 oz)] 99.202 kg (218 lb 11.2 oz) (12/06 0513) Last BM Date: 04/30/14  Intake/Output from previous day: 12/05 0701 - 12/06 0700 In: 37807.8 [P.O.:318; I.V.:345.8; Blood:644] Out: 74259 [Urine:47800; Stool:650] Intake/Output this shift: Total I/O In: -  Out: 2000 [Urine:2000]  General appearance: alert, cooperative, appears older than stated age and very pleasant Head: Normocephalic, without obvious abnormality, atraumatic Nose: Nares normal. Septum midline. Mucosa normal. No drainage or sinus tenderness. Throat: lips, mucosa, and tongue normal; teeth and gums normal Neck: supple, symmetrical, trachea midline Back: symmetric, no curvature. ROM normal. No CVA tenderness. Resp: non-labored Cardio: tachycardia GI: soft, non-tender; bowel sounds normal; no masses,  no  organomegaly Male genitalia: normal, 3 way foley in place wtih dark pink urine in foley bag on NS gtt some few clots in tubing, but flowing well.  Extremities: Bilat edema to mid thigh Pulses: 2+ and symmetric Skin: Skin color, texture, turgor normal. No rashes or lesions Lymph nodes: Cervical, supraclavicular, and axillary nodes normal. Neurologic: Grossly normal  Lab Results:   Recent Labs  05/03/14 0500 05/04/14 0608  WBC 5.0 6.4  HGB 7.0* 5.5*  HCT 20.9* 16.9*  PLT 30* 31*   BMET  Recent Labs  05/03/14 0500 05/04/14 0608  NA 138 137  K 3.7 3.5*  CL 106 106  CO2 20 20  GLUCOSE 80 153*  BUN 40* 36*  CREATININE 3.88* 3.22*  CALCIUM 7.7* 7.5*   PT/INR  Recent Labs  05/03/14 0500  LABPROT 16.6*  INR 1.33   ABG No results for input(s): PHART, HCO3 in the last 72 hours.  Invalid input(s): PCO2, PO2  Studies/Results: Ir Perc Nephrostomy Left  05/03/2014   CLINICAL DATA:  Left hydronephrosis and ureteral obstruction  EXAM: PERC NEPHROSTOMY*L*; IR ULTRASOUND GUIDANCE  FLUOROSCOPY TIME:  2 min and 42 seconds.  MEDICATIONS AND MEDICAL HISTORY: Versed 1.5 mg, Fentanyl 75 mcg.  As antibiotic prophylaxis, Ancef was ordered pre-procedure and administered intravenously within one hour of incision.  ANESTHESIA/SEDATION: Moderate sedation time: 15 minutes  CONTRAST:  10 cc Omnipaque 300  PROCEDURE: The procedure, risks, benefits, and alternatives were explained to the patient. Questions regarding the procedure were encouraged and answered. The patient understands and consents to the procedure.  The back was prepped with Betadine in a  sterile fashion, and a sterile drape was applied covering the operative field. A sterile gown and sterile gloves were used for the procedure.  Under sonographic guidance, a 22 gauge Chiba needle was inserted into a posterior lower pole calyx. Contrast was injected. It was removed over a 018 wire which was up sized to a 3 J. A 10 French dilator followed  by a 26 French nephrostomy was advanced over the wire and coiled in the renal pelvis. Contrast was injected. It was sewn to the skin.  FINDINGS: Images document access into the left kidney collecting system via posterior lower pole calices. The final image demonstrates a 10 French nephrostomy coiled in the left renal pelvis.  COMPLICATIONS: None  IMPRESSION: Successful left nephrostomy.   Electronically Signed   By: Maryclare Bean M.D.   On: 05/03/2014 12:19   Ir US Guide Bx Asp/drain  05/03/2014   CLINICAL DATA:  Left hydronephrosis and ureteral obstruction  EXAM: PERC NEPHROSTOMY*L*; IR ULTRASOUND GUIDANCE  FLUOROSCOPY TIME:  2 min and 42 seconds.  MEDICATIONS AND MEDICAL HISTORY: Versed 1.5 mg, Fentanyl 75 mcg.  As antibiotic prophylaxis, Ancef was ordered pre-procedure and administered intravenously within one hour of incision.  ANESTHESIA/SEDATION: Moderate sedation time: 15 minutes  CONTRAST:  10 cc Omnipaque 300  PROCEDURE: The procedure, risks, benefits, and alternatives were explained to the patient. Questions regarding the procedure were encouraged and answered. The patient understands and consents to the procedure.  The back was prepped with Betadine in a sterile fashion, and a sterile drape was applied covering the operative field. A sterile gown and sterile gloves were used for the procedure.  Under sonographic guidance, a 22 gauge Chiba needle was inserted into a posterior lower pole calyx. Contrast was injected. It was removed over a 018 wire which was up sized to a 3 J. A 10 French dilator followed by a 5 French nephrostomy was advanced over the wire and coiled in the renal pelvis. Contrast was injected. It was sewn to the skin.  FINDINGS: Images document access into the left kidney collecting system via posterior lower pole calices. The final image demonstrates a 10 French nephrostomy coiled in the left renal pelvis.  COMPLICATIONS: None  IMPRESSION: Successful left nephrostomy.   Electronically  Signed   By: Maryclare Bean M.D.   On: 05/03/2014 12:19    Anti-infectives: Anti-infectives    Start     Dose/Rate Route Frequency Ordered Stop   05/03/14 2200  vancomycin (VANCOCIN) IVPB 1000 mg/200 mL premix     1,000 mg200 mL/hr over 60 Minutes Intravenous Every 24 hours 05/03/14 2151     05/03/14 1200  ceFAZolin (ANCEF) IVPB 2 g/50 mL premix     2 g100 mL/hr over 30 Minutes Intravenous On call 05/02/14 1254 05/03/14 1011   05/03/14 0914  ceFAZolin (ANCEF) 2-3 GM-% IVPB SOLR    Comments:  Shaaron Adler   : cabinet override      05/03/14 0914 05/03/14 2114      Assessment/Plan:  1 - Gross Hematuria with Anemia, Thrombocytopenia - Difficult situation as likley multifactorial etiology with low platelets (chemo and infection effect) and ureteral stone. Not presently operative candidate for ureteral stone as Hgb quite low and infectious parameters present.   I feel best current plan is continue bladder irrigation to avoid acute clotting, consider plt and RBC transfusion, and treat likely infection. Ideally treat stone in more elective setting.   2 - Left Ureteral Stone - now s/p left neph tube for  drainage, continue. Will need more definitive management with likely ureterocopy when hemodynamic and infetios parameters improved.   3 - Acute Renal Failure - Improving with hydration and maximal renal drainage.   4 - Bacteruria - Agree with current ABX pending final CX's. Hopefully treating this will also help his platelets come up some as well.   Will follow, pleas call with questions anytime.   [facility], Jahleel Stroschein 05/04/2014

## 2014-05-04 NOTE — Plan of Care (Signed)
Problem: Phase II Progression Outcomes Goal: Vital signs remain stable Outcome: Completed/Met Date Met:  05/04/14 Goal: IV changed to normal saline lock Outcome: Completed/Met Date Met:  05/04/14

## 2014-05-04 NOTE — Progress Notes (Signed)
[facility] PROGRESS NOTE  ANTIONE OBAR HYW:[id number] DOB: [birth year] DOA: 04/28/2014 PCP: [name], MD   Brief narrative 63 year old male with history of metastatic colon cancer receiving chemotherapy in [place], anemia, coronary artery disease, history of hydronephrosis status post stenting in April 2015, nephrolithiasis, chronic kidney disease stage III who was sent to the ED from his hematology office. Patient reports intermittent hematuria for of about 2-3 weeks duration. Few days back he thought he passed his ureteral stent while using the bathroom. When he presented to his oncologist for chemotherapy he was found to have progressively worsened renal function. His last normal creatinine was 1.67 about one month back which worsened to 7.1 on 11/16. He was followed up again on 11/23 by his oncologist and his creatinine had worsened to 12.47. A renal ultrasound was done which showed moderate bilateral hydronephrosis with nephrocalcinosis. On 11/30 he was found to have bilateral lower extremity purpura and increased leg swellings. Blood work done showed platelets of 52,000 and creatinine of 18.4. He was also found to be in metabolic acidosis with CO2 9 with high anion gap.  A CT scan of the abdomen done in 11/30 showed increasing moderate to severe right hydronephrosis and severe left hydronephrosis. Also showed a 0.9x0.2 cm calculus at the left UPJ and unchanged multiple bilateral renal calculi  Urology was consulted and recommended admission to [facility] initially but [facility] was [facility] for [facility] evaluation.   Assessment/Plan: Acute on chronic kidney disease stage III secondary to obstructive uropathy with severe metabolic acidosis  -AG Metabolic acidosis resolved with improvement in renal fn and bicarb drip . Renal fn improving after floey placed on 11/30 -bedside cystoscopy performed by Dr Jeffie Pollock on 12/3 showing a large amount of old and fresh clot that  appears to have come from the left collecting system. There was a clot protruding from the ureteral orifice on the left which was left as is. S/p evacuation of the bladder clots with a 91fr Ainsworth foley. His prostate was short and minimally obstructing and the bladder wall had no evidence of cancer or stones. Foley was removed but had to be placed again on 12/4 due to urinary retention. -creatinine improving. Renal signed off -most likely source of bleeding are the left ureteral and renal stones,. evaluated by IR and placed rt sided PCN. Still having hematuria. reportedly has blood clots with difficulty flushing out. Dr Tresa Moore will see t today. Have made NPO in case intervention planned.  -continuous  Irrigation. -   Severe Anemia and thrombocytopenia Secondary to active hematuria and blood clots. Also contributed chemotherapy and metastatic colon cancer  received 5 u PRBC since admission .  received 6 units PRBC during 2 weeks prior to admission.  could not complete transfusion yesterday due to fever. Hb dropped to 5.5 this am. Ordered another 2 U prbc Thrombocytopenia due to cirrhosis of liver and chemotherapy per oncology. Received 4 u platelets since admission without any improvement.  Oncology consulted. Dr Marin Olp will see pt.    Sepsis on 12/5  Patient febrile and hypotensive with left kidney fluid sent after PCN growing moderate GPC in pairs and chains . Placed on empiric vancomycin. Monitor sensitivity. Follow blood cx.   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 Will follow recs  Palpable purpura Noted HSP/ plantar erythrodysthesia. Reports to be due to chemotherapy Apply topical cream  Protein calorie malnutrition, severe Nutrition consult  B/l  leg edema Prn lasix   DVT prophylaxis: SCD  -Code Status: full code family Communication: none at  bedside Disposition Plan: transfer to stepdown due to medical complexity   Consultants:  Dr Jeffie Pollock  Renal ( signed off)  IR  hematology  Procedures:  Cystoscopy on 12/3  left percutaneous nephrostomy on 12/5  Antibiotics:  IV vanco 12/5--  HPI/Subjective:  Lt flank pain better this am. Pt febrile to 102F shortly after transfusion initiated last evening and was held. Urine cx from PCN sent growing GPC. Started on empiric vanco. Blood cx sent.   Objective: Filed Vitals:   05/04/14 0513  BP:   Pulse: 80  Temp: 98.7 F (37.1 C)  Resp:     Intake/Output Summary (Last 24 hours) at 05/04/14 0842 Last data filed at 05/04/14 0742  Gross per 24 hour  Intake 37267.83 ml  Output  49850 ml  Net -12582.17 ml   Filed Weights   05/01/14 0530 05/03/14 0619 05/04/14 0513  Weight: 100 kg (220 lb 7.4 oz) 99.7 kg (219 lb 12.8 oz) 99.202 kg (218 lb 11.2 oz)    Exam:   General: middle aged male, appears fatigued  HEENT:pallo+  moist mucosa  Chest: Diminished bibasilar breath sounds  CVS: Normal S1 and S2, no murmurs   abdomen:soft, NT, ND, ileostomy status, lt PCN. Foley with blood mixed urine  Extremities: Palpable purpura over the dorsum of bilateral feet, 1+ pitting edema bilaterally  CNS: Awake and alert,  Data Reviewed: Basic Metabolic Panel:  Recent Labs Lab 04/28/14 0851  04/30/14 0553 05/01/14 0551 05/02/14 0545 05/03/14 0500 05/04/14 0608  NA 134*  < > 141 143 143 138 137  K 5.4*  < > 4.1 3.2* 3.7 3.7 3.5*  CL 101  < > 113* 111 111 106 106  CO2 9*  < > 10* 19 21 20 20   GLUCOSE 140*  < > 103* 113* 84 80 153*  BUN 113*  < > 80* 57* 49* 40* 36*  CREATININE 18.42*  < > 9.93* 5.97* 4.82* 3.88* 3.22*  CALCIUM 7.9*  < > 7.1* 6.8* 7.3* 7.7* 7.5*  MG 2.5  --   --   --   --   --   --   PHOS  --   --   --  6.7*  --   --   --   < > = values in this interval not displayed. Liver Function Tests:  Recent Labs Lab 04/28/14 0851 04/30/14 0553  05/01/14 0551  AST 39* 36  --   ALT 34 34  --   ALKPHOS 76 59  --   BILITOT 0.6 2.2*  --   PROT 6.0 4.7*  --   ALBUMIN 2.0* 1.7* 1.8*   No results for input(s): LIPASE, AMYLASE in the last 168 hours. No results for input(s): AMMONIA in the last 168 hours. CBC:  Recent Labs Lab 04/28/14 0851  04/29/14 1700  05/01/14 1310 05/01/14 1820 05/02/14 0545 05/03/14 0500 05/04/14 0608  WBC 10.5  < > 7.1  < > 6.2 7.5 5.9 5.0 6.4  NEUTROABS 9.5*  --  6.6  --   --   --   --   --   --   HGB 4.6*  < > 6.1*  < > 6.7* 8.2* 7.5* 7.0* 5.5*  HCT 15.0*  < > 18.7*  < > 20.4* 24.5* 22.8* 20.9* 16.9*  MCV 106.4*  < > 95.9  < > 93.2 91.4 93.1 95.4 96.6  PLT 52*  < > 31*  < > 37* 38* 35* 30* 31*  < > = values in this interval not displayed. Cardiac Enzymes: No results for input(s): CKTOTAL, CKMB, CKMBINDEX, TROPONINI in the last 168 hours. BNP (last 3 results) No results for input(s): PROBNP in the last 8760 hours. CBG:  Recent Labs Lab 05/04/14 0754  GLUCAP 118*    Recent Results (from the past 240 hour(s))  Body fluid culture     Status: None (Preliminary result)   Collection Time: 05/03/14 10:03 AM  Result Value Ref Range Status   Specimen Description FLUID LEFT KIDNEY  Final   Special Requests NONE  Final   Gram Stain   Final    ABUNDANT WBC PRESENT, PREDOMINANTLY PMN MODERATE GRAM POSITIVE COCCI IN PAIRS AND CHAINS Gram Stain Report Called to,Read Back By and Verified With: Gram Stain Report Called to,Read Back By and Verified With: Stefani Dama RN on 05/03/14 at 21:20 by Rise Mu Performed at [facility]    Culture PENDING  Incomplete   Report Status PENDING  Incomplete     Studies: Ir Perc Nephrostomy Left  05/03/2014   CLINICAL DATA:  Left hydronephrosis and ureteral obstruction  EXAM: PERC NEPHROSTOMY*L*; IR ULTRASOUND GUIDANCE  FLUOROSCOPY TIME:  2 min and 42 seconds.  MEDICATIONS AND MEDICAL HISTORY: Versed 1.5 mg, Fentanyl 75 mcg.  As antibiotic prophylaxis,  Ancef was ordered pre-procedure and administered intravenously within one hour of incision.  ANESTHESIA/SEDATION: Moderate sedation time: 15 minutes  CONTRAST:  10 cc Omnipaque 300  PROCEDURE: The procedure, risks, benefits, and alternatives were explained to the patient. Questions regarding the procedure were encouraged and answered. The patient understands and consents to the procedure.  The back was prepped with Betadine in a sterile fashion, and a sterile drape was applied covering the operative field. A sterile gown and sterile gloves were used for the procedure.  Under sonographic guidance, a 22 gauge Chiba needle was inserted into a posterior lower pole calyx. Contrast was injected. It was removed over a 018 wire which was up sized to a 3 J. A 10 French dilator followed by a 38 French nephrostomy was advanced over the wire and coiled in the renal pelvis. Contrast was injected. It was sewn to the skin.  FINDINGS: Images document access into the left kidney collecting system via posterior lower pole calices. The final image demonstrates a 10 French nephrostomy coiled in the left renal pelvis.  COMPLICATIONS: None  IMPRESSION: Successful left nephrostomy.   Electronically Signed   By: Maryclare Bean M.D.   On: 05/03/2014 12:19   Ir US Guide Bx Asp/drain  05/03/2014   CLINICAL DATA:  Left hydronephrosis and ureteral obstruction  EXAM: PERC NEPHROSTOMY*L*; IR ULTRASOUND GUIDANCE  FLUOROSCOPY TIME:  2 min and 42 seconds.  MEDICATIONS AND MEDICAL HISTORY: Versed 1.5 mg, Fentanyl 75 mcg.  As antibiotic prophylaxis, Ancef was ordered pre-procedure and administered intravenously within one hour of incision.  ANESTHESIA/SEDATION: Moderate sedation time: 15 minutes  CONTRAST:  10 cc Omnipaque 300  PROCEDURE: The procedure, risks, benefits, and alternatives were explained to the patient. Questions regarding the procedure were encouraged and answered. The patient understands and consents to the procedure.  The back was  prepped with Betadine in a sterile fashion, and a sterile drape was applied covering the operative field. A sterile gown and sterile gloves were used for the procedure.  Under sonographic guidance, a 22 gauge Chiba needle was inserted into a posterior lower pole calyx.  Contrast was injected. It was removed over a 018 wire which was up sized to a 3 J. A 10 French dilator followed by a 45 French nephrostomy was advanced over the wire and coiled in the renal pelvis. Contrast was injected. It was sewn to the skin.  FINDINGS: Images document access into the left kidney collecting system via posterior lower pole calices. The final image demonstrates a 10 French nephrostomy coiled in the left renal pelvis.  COMPLICATIONS: None  IMPRESSION: Successful left nephrostomy.   Electronically Signed   By: Maryclare Bean M.D.   On: 05/03/2014 12:19    Scheduled Meds: . sodium chloride  10 mL/hr Intravenous Once  . sodium chloride   Intravenous Once  . sodium chloride   Intravenous Once  . sodium chloride   Intravenous Once  . sodium chloride   Intravenous Once  . cyanocobalamin  1,000 mcg Intramuscular Q30 days  . folic acid  1 mg Oral Daily  . hydrocortisone cream  1 application Topical BID  . pantoprazole  40 mg Oral Daily  . sodium chloride  3 mL Intravenous Q12H  . vancomycin  1,000 mg Intravenous Q24H   Continuous Infusions:     Time spent: 35 minutes    Maddelyn Rocca, [place]  [facility] Pager [phone] If 7PM-7AM, please contact night-coverage at [email or web address], password [facility] 05/04/2014, 8:42 AM  LOS: 6 days

## 2014-05-05 ENCOUNTER — Inpatient Hospital Stay (HOSPITAL_COMMUNITY): Payer: Managed Care, Other (non HMO)

## 2014-05-05 DIAGNOSIS — B952 Enterococcus as the cause of diseases classified elsewhere: Secondary | ICD-10-CM

## 2014-05-05 DIAGNOSIS — N390 Urinary tract infection, site not specified: Secondary | ICD-10-CM

## 2014-05-05 LAB — BASIC METABOLIC PANEL
Anion gap: 11 (ref 5–15)
BUN: 34 mg/dL — ABNORMAL HIGH (ref 6–23)
CHLORIDE: 105 meq/L (ref 96–112)
CO2: 21 meq/L (ref 19–32)
Calcium: 8 mg/dL — ABNORMAL LOW (ref 8.4–10.5)
Creatinine, Ser: 2.5 mg/dL — ABNORMAL HIGH (ref 0.50–1.35)
GFR calc Af Amer: 30 mL/min — ABNORMAL LOW (ref >90)
GFR, EST NON AFRICAN AMERICAN: 26 mL/min — AB (ref >90)
GLUCOSE: 131 mg/dL — AB (ref 70–99)
POTASSIUM: 4.2 meq/L (ref 3.7–5.3)
SODIUM: 137 meq/L (ref 137–147)

## 2014-05-05 LAB — BODY FLUID CULTURE

## 2014-05-05 LAB — URINE CULTURE
Colony Count: NO GROWTH
Culture: NO GROWTH

## 2014-05-05 LAB — TRANSFUSION REACTION
DAT C3: NEGATIVE
Post RXN DAT IgG: NEGATIVE

## 2014-05-05 LAB — CBC
HEMATOCRIT: 20.3 % — AB (ref 39.0–52.0)
HEMOGLOBIN: 6.8 g/dL — AB (ref 13.0–17.0)
MCH: 30.5 pg (ref 26.0–34.0)
MCHC: 33.5 g/dL (ref 30.0–36.0)
MCV: 91 fL (ref 78.0–100.0)
Platelets: 44 10*3/uL — ABNORMAL LOW (ref 150–400)
RBC: 2.23 MIL/uL — AB (ref 4.22–5.81)
RDW: 19.4 % — ABNORMAL HIGH (ref 11.5–15.5)
WBC: 4.5 10*3/uL (ref 4.0–10.5)

## 2014-05-05 LAB — PREPARE RBC (CROSSMATCH)

## 2014-05-05 MED ORDER — AMOXICILLIN 250 MG PO CHEW
250.0000 mg | CHEWABLE_TABLET | Freq: Two times a day (BID) | ORAL | Status: DC
Start: 2014-05-05 — End: 2014-05-05

## 2014-05-05 MED ORDER — AMOXICILLIN 250 MG PO CAPS
250.0000 mg | ORAL_CAPSULE | Freq: Two times a day (BID) | ORAL | Status: DC
  Administered 2014-05-05 – 2014-05-18 (×25): 250 mg via ORAL
  Filled 2014-05-05 (×30): qty 1

## 2014-05-05 MED ORDER — DIPHENHYDRAMINE HCL 50 MG/ML IJ SOLN
25.0000 mg | Freq: Once | INTRAMUSCULAR | Status: AC
  Administered 2014-05-05: 25 mg via INTRAVENOUS
  Filled 2014-05-05: qty 1

## 2014-05-05 MED ORDER — SODIUM CHLORIDE 0.9 % IV SOLN
Freq: Once | INTRAVENOUS | Status: AC
  Administered 2014-05-05: 15:00:00 via INTRAVENOUS

## 2014-05-05 MED ORDER — SODIUM CHLORIDE 0.9 % IV SOLN
25.0000 mg | Freq: Once | INTRAVENOUS | Status: AC
  Administered 2014-05-05: 25 mg via INTRAVENOUS
  Filled 2014-05-05: qty 0.5

## 2014-05-05 MED ORDER — SODIUM CHLORIDE 0.9 % IV SOLN
1500.0000 mg | Freq: Once | INTRAVENOUS | Status: AC
  Administered 2014-05-05: 1500 mg via INTRAVENOUS
  Filled 2014-05-05 (×2): qty 30

## 2014-05-05 MED ORDER — FUROSEMIDE 10 MG/ML IJ SOLN
40.0000 mg | Freq: Once | INTRAMUSCULAR | Status: AC
  Administered 2014-05-05: 40 mg via INTRAVENOUS
  Filled 2014-05-05: qty 4

## 2014-05-05 MED ORDER — ACETAMINOPHEN 325 MG PO TABS
650.0000 mg | ORAL_TABLET | Freq: Once | ORAL | Status: AC
  Administered 2014-05-05: 650 mg via ORAL
  Filled 2014-05-05: qty 2

## 2014-05-05 NOTE — Progress Notes (Signed)
Referring Physician(s): Jeffie Pollock  Subjective:  Left renal calculi/ hydronephrosis L PCN placed 12/5 Renal fxn better daily Output still bloody- lighter tho thrombocytopenia   Allergies: Daypro and Xeloda  Medications: Prior to Admission medications   Medication Sig Start Date End Date Taking? Authorizing Provider  amoxicillin-clavulanate (AUGMENTIN) 875-125 MG per tablet Take 1 tablet by mouth every 12 (twelve) hours. 04/18/14  Yes Historical Provider, MD  clindamycin (CLEOCIN-T) 1 % lotion Apply topically 2 (two) times daily. 04/07/14  Yes Manon Hilding Kefalas, PA-C  cyanocobalamin (,VITAMIN B-12,) 1000 MCG/ML injection Inject 1,000 mcg into the muscle every 30 (thirty) days.    Yes Historical Provider, MD  folic acid (FOLVITE) 1 MG tablet Take 1 tablet (1 mg total) by mouth daily. 04/28/14  Yes Baird Cancer, PA-C  HYDROcodone-acetaminophen (NORCO/VICODIN) 5-325 MG per tablet Take 1-2 tablets by mouth every 6 (six) hours as needed. 04/14/14  Yes Manon Hilding Kefalas, PA-C  hydrocortisone cream 1 % Apply 1 application topically 2 (two) times daily. Mix with urea cream and apply to affected areas. 04/23/14  Yes Manon Hilding Kefalas, PA-C  hydrocortisone-urea (CARMOL-HC) 1-10 % cream Apply topically 3 (three) times daily. 04/22/14  Yes Farrel Gobble, MD  lidocaine-prilocaine (EMLA) cream Apply a quarter size amount to port site 1 hour prior to chemo. Do not rub in. Cover with plastic wrap. 03/13/14  Yes Baird Cancer, PA-C  omeprazole (PRILOSEC) 20 MG capsule Take 1 capsule (20 mg total) by mouth daily. 09/28/13  Yes Rexene Alberts, MD  ondansetron (ZOFRAN) 8 MG tablet Take 1 tablet (8 mg total) by mouth 2 (two) times daily as needed for nausea or vomiting. 03/13/14  Yes Manon Hilding Kefalas, PA-C  urea (CARMOL) 10 % cream Apply topically as needed. Mix with hydrocortisone cream and apply to affected areas. 04/23/14  Yes Baird Cancer, PA-C    Review of Systems  Vital Signs: BP 108/53 mmHg   Pulse 70  Temp(Src) 98.3 F (36.8 C) (Oral)  Resp 16  Ht 5\' 10"  (1.778 m)  Wt 103.4 kg (227 lb 15.3 oz)  BMI 32.71 kg/m2  SpO2 100%  Physical Exam  Abdominal:  Left flank site NT No bleeding Output 525 cc yesterday Still bloody output in bag; 100 cc in bag Bun/Cr decreasing Wbc wnl    Imaging: Ir Perc Nephrostomy Left  05/03/2014   CLINICAL DATA:  Left hydronephrosis and ureteral obstruction  EXAM: PERC NEPHROSTOMY*L*; IR ULTRASOUND GUIDANCE  FLUOROSCOPY TIME:  2 min and 42 seconds.  MEDICATIONS AND MEDICAL HISTORY: Versed 1.5 mg, Fentanyl 75 mcg.  As antibiotic prophylaxis, Ancef was ordered pre-procedure and administered intravenously within one hour of incision.  ANESTHESIA/SEDATION: Moderate sedation time: 15 minutes  CONTRAST:  10 cc Omnipaque 300  PROCEDURE: The procedure, risks, benefits, and alternatives were explained to the patient. Questions regarding the procedure were encouraged and answered. The patient understands and consents to the procedure.  The back was prepped with Betadine in a sterile fashion, and a sterile drape was applied covering the operative field. A sterile gown and sterile gloves were used for the procedure.  Under sonographic guidance, a 22 gauge Chiba needle was inserted into a posterior lower pole calyx. Contrast was injected. It was removed over a 018 wire which was up sized to a 3 J. A 10 French dilator followed by a 18 French nephrostomy was advanced over the wire and coiled in the renal pelvis. Contrast was injected. It was sewn to the skin.  FINDINGS: Images  document access into the left kidney collecting system via posterior lower pole calices. The final image demonstrates a 10 French nephrostomy coiled in the left renal pelvis.  COMPLICATIONS: None  IMPRESSION: Successful left nephrostomy.   Electronically Signed   By: Maryclare Bean M.D.   On: 05/03/2014 12:19   Ir US Guide Bx Asp/drain  05/03/2014   CLINICAL DATA:  Left hydronephrosis and ureteral  obstruction  EXAM: PERC NEPHROSTOMY*L*; IR ULTRASOUND GUIDANCE  FLUOROSCOPY TIME:  2 min and 42 seconds.  MEDICATIONS AND MEDICAL HISTORY: Versed 1.5 mg, Fentanyl 75 mcg.  As antibiotic prophylaxis, Ancef was ordered pre-procedure and administered intravenously within one hour of incision.  ANESTHESIA/SEDATION: Moderate sedation time: 15 minutes  CONTRAST:  10 cc Omnipaque 300  PROCEDURE: The procedure, risks, benefits, and alternatives were explained to the patient. Questions regarding the procedure were encouraged and answered. The patient understands and consents to the procedure.  The back was prepped with Betadine in a sterile fashion, and a sterile drape was applied covering the operative field. A sterile gown and sterile gloves were used for the procedure.  Under sonographic guidance, a 22 gauge Chiba needle was inserted into a posterior lower pole calyx. Contrast was injected. It was removed over a 018 wire which was up sized to a 3 J. A 10 French dilator followed by a 82 French nephrostomy was advanced over the wire and coiled in the renal pelvis. Contrast was injected. It was sewn to the skin.  FINDINGS: Images document access into the left kidney collecting system via posterior lower pole calices. The final image demonstrates a 10 French nephrostomy coiled in the left renal pelvis.  COMPLICATIONS: None  IMPRESSION: Successful left nephrostomy.   Electronically Signed   By: Maryclare Bean M.D.   On: 05/03/2014 12:19    Labs:  CBC:  Recent Labs  05/02/14 0545 05/03/14 0500 05/04/14 0608 05/05/14 0525  WBC 5.9 5.0 6.4 4.5  HGB 7.5* 7.0* 5.5* 6.8*  HCT 22.8* 20.9* 16.9* 20.3*  PLT 35* 30* 31* 44*    COAGS:  Recent Labs  09/10/13 1250 09/20/13 1305 05/03/14 0500  INR 1.16 1.15 1.33  APTT 38*  --   --     BMP:  Recent Labs  05/02/14 0545 05/03/14 0500 05/04/14 0608 05/05/14 0525  NA 143 138 137 137  K 3.7 3.7 3.5* 4.2  CL 111 106 106 105  CO2 21 20 20 21   GLUCOSE 84 80 153*  131*  BUN 49* 40* 36* 34*  CALCIUM 7.3* 7.7* 7.5* 8.0*  CREATININE 4.82* 3.88* 3.22* 2.50*  GFRNONAA 12* 15* 19* 26*  GFRAA 14* 18* 22* 30*    LIVER FUNCTION TESTS:  Recent Labs  04/14/14 0900 04/21/14 0842 04/28/14 0851 04/30/14 0553 05/01/14 0551  BILITOT 1.0 0.7 0.6 2.2*  --   AST 33 27 39* 36  --   ALT 24 24 34 34  --   ALKPHOS 115 91 76 59  --   PROT 6.3 6.1 6.0 4.7*  --   ALBUMIN 2.8* 2.2* 2.0* 1.7* 1.8*    Assessment and Plan:  L hydro secondary stone L PCN intact Bloody urine output Will follow    I spent a total of 15 minutes face to face in clinical consultation/evaluation, greater than 50% of which was counseling/coordinating care for L PCN  Signed: Avram Danielson A 05/05/2014, 2:35 PM

## 2014-05-05 NOTE — Progress Notes (Signed)
Patient ID: [name], male   DOB: 04-Jul-1950, 62 y.o.   MRN: [id number]    Subjective: [name] reports reduced pain since placement of the left NT.  He has required some irrigation of the NT but it has been otherwise draining well.  His foley is on CBI and the urine remains bloody, but it irrigates freely with clearing with increase flow.  His platelet count remains low at 44.  His Hgb is 6.8 post transfusion.  The Cr has continued to fall and is down to 2.5 since the NT was placed.   He had GPC on Gstain and is on vanc but the urine culture from the NT is pending.  ROS:  Review of Systems  Constitutional: Positive for fever (he had fever to 102 overnight and it has been up and down since the tube was placed. ).  Gastrointestinal: Negative for nausea.  Genitourinary: Negative for flank pain.    Anti-infectives: Anti-infectives    Start     Dose/Rate Route Frequency Ordered Stop   05/03/14 2200  vancomycin (VANCOCIN) IVPB 1000 mg/200 mL premix     1,000 mg200 mL/hr over 60 Minutes Intravenous Every 24 hours 05/03/14 2151     05/03/14 1200  ceFAZolin (ANCEF) IVPB 2 g/50 mL premix     2 g100 mL/hr over 30 Minutes Intravenous On call 05/02/14 1254 05/03/14 1011   05/03/14 0914  ceFAZolin (ANCEF) 2-3 GM-% IVPB SOLR    Comments:  [name]   : cabinet override      05/03/14 0914 05/03/14 2114      Current Facility-Administered Medications  Medication Dose Route Frequency Provider Last Rate Last Dose  . acetaminophen (TYLENOL) tablet 650 mg  650 mg Oral Q6H PRN Nishant Dhungel, MD   650 mg at 05/03/14 1810  . cyanocobalamin ((VITAMIN B-12)) injection 1,000 mcg  1,000 mcg Intramuscular Q30 days Shanda Howells, MD   1,000 mcg at 04/29/14 0947  . folic acid (FOLVITE) tablet 1 mg  1 mg Oral Daily Shanda Howells, MD   1 mg at 05/04/14 0932  . HYDROcodone-acetaminophen (NORCO/VICODIN) 5-325 MG per tablet 1-2 tablet  1-2 tablet Oral Q6H PRN Shanda Howells, MD   2 tablet at 05/05/14 0256  .  hydrocortisone cream 1 % 1 application  1 application Topical BID Shanda Howells, MD   1 application at 61/60/73 2230  . hydrocortisone cream 1 %   Topical TID PRN Carmin Muskrat, MD      . HYDROmorphone (DILAUDID) injection 2 mg  2 mg Intravenous Q4H PRN Nishant Dhungel, MD   2 mg at 05/04/14 2149  . nystatin (MYCOSTATIN/NYSTOP) topical powder   Topical BID PRN Nishant Dhungel, MD      . ondansetron (ZOFRAN) tablet 8 mg  8 mg Oral BID PRN Shanda Howells, MD      . pantoprazole (PROTONIX) EC tablet 40 mg  40 mg Oral Daily Shanda Howells, MD   40 mg at 05/04/14 0932  . sodium chloride 0.9 % injection 10-40 mL  10-40 mL Intracatheter PRN Nishant Dhungel, MD   10 mL at 05/04/14 2200  . sodium chloride 0.9 % injection 3 mL  3 mL Intravenous Q12H Shanda Howells, MD   3 mL at 05/04/14 0932  . vancomycin (VANCOCIN) IVPB 1000 mg/200 mL premix  1,000 mg Intravenous Q24H Nishant Dhungel, MD   1,000 mg at 05/04/14 2203   Facility-Administered Medications Ordered in Other Encounters  Medication Dose Route Frequency Provider Last Rate Last Dose  .  0.9 %  sodium chloride infusion   Intravenous Continuous Pieter Partridge, MD 20 mL/hr at 07/17/12 1113    . heparin lock flush 100 unit/mL  500 Units Intravenous Once Pieter Partridge, MD      . sodium chloride 0.9 % injection 10 mL  10 mL Intravenous PRN Pieter Partridge, MD   10 mL at 07/17/12 1113     Objective: Vital signs in last 24 hours: Temp:  [97.4 F (36.3 C)-99 F (37.2 C)] 97.4 F (36.3 C) (12/07 0340) Pulse Rate:  [32-81] 59 (12/07 0600) Resp:  [9-22] 9 (12/07 0600) BP: (101-140)/(44-87) 114/59 mmHg (12/07 0600) SpO2:  [96 %-100 %] 99 % (12/07 0600) Weight:  [97.9 kg (215 lb 13.3 oz)-103.4 kg (227 lb 15.3 oz)] 103.4 kg (227 lb 15.3 oz) (12/07 0340)  Intake/Output from previous day: 12/06 0701 - 12/07 0700 In: 15661.3 [I.V.:13.3; Blood:1238] Out: 56433 [IRJJO:84166; AYTKZ:6010] Intake/Output this shift:     Physical Exam  Constitutional:  He is well-developed, well-nourished, and in no distress.  Abdominal:  Left NT is draining bloody urine.   Genitourinary:  Foley is draining bloody urine but irrigates easily with CBI.     Lab Results:   Recent Labs  05/04/14 0608 05/05/14 0525  WBC 6.4 4.5  HGB 5.5* 6.8*  HCT 16.9* 20.3*  PLT 31* 44*   BMET  Recent Labs  05/04/14 0608 05/05/14 0525  NA 137 137  K 3.5* 4.2  CL 106 105  CO2 20 21  GLUCOSE 153* 131*  BUN 36* 34*  CREATININE 3.22* 2.50*  CALCIUM 7.5* 8.0*   PT/INR  Recent Labs  05/03/14 0500  LABPROT 16.6*  INR 1.33   ABG No results for input(s): PHART, HCO3 in the last 72 hours.  Invalid input(s): PCO2, PO2  Studies/Results: Ir Perc Nephrostomy Left  05/03/2014   CLINICAL DATA:  Left hydronephrosis and ureteral obstruction  EXAM: PERC NEPHROSTOMY*L*; IR ULTRASOUND GUIDANCE  FLUOROSCOPY TIME:  2 min and 42 seconds.  MEDICATIONS AND MEDICAL HISTORY: Versed 1.5 mg, Fentanyl 75 mcg.  As antibiotic prophylaxis, Ancef was ordered pre-procedure and administered intravenously within one hour of incision.  ANESTHESIA/SEDATION: Moderate sedation time: 15 minutes  CONTRAST:  10 cc Omnipaque 300  PROCEDURE: The procedure, risks, benefits, and alternatives were explained to the patient. Questions regarding the procedure were encouraged and answered. The patient understands and consents to the procedure.  The back was prepped with Betadine in a sterile fashion, and a sterile drape was applied covering the operative field. A sterile gown and sterile gloves were used for the procedure.  Under sonographic guidance, a 22 gauge Chiba needle was inserted into a posterior lower pole calyx. Contrast was injected. It was removed over a 018 wire which was up sized to a 3 J. A 10 French dilator followed by a 64 French nephrostomy was advanced over the wire and coiled in the renal pelvis. Contrast was injected. It was sewn to the skin.  FINDINGS: Images document access into the  left kidney collecting system via posterior lower pole calices. The final image demonstrates a 10 French nephrostomy coiled in the left renal pelvis.  COMPLICATIONS: None  IMPRESSION: Successful left nephrostomy.   Electronically Signed   By: Maryclare Bean M.D.   On: 05/03/2014 12:19   Ir US Guide Bx Asp/drain  05/03/2014   CLINICAL DATA:  Left hydronephrosis and ureteral obstruction  EXAM: PERC NEPHROSTOMY*L*; IR ULTRASOUND GUIDANCE  FLUOROSCOPY TIME:  2 min and 42  seconds.  MEDICATIONS AND MEDICAL HISTORY: Versed 1.5 mg, Fentanyl 75 mcg.  As antibiotic prophylaxis, Ancef was ordered pre-procedure and administered intravenously within one hour of incision.  ANESTHESIA/SEDATION: Moderate sedation time: 15 minutes  CONTRAST:  10 cc Omnipaque 300  PROCEDURE: The procedure, risks, benefits, and alternatives were explained to the patient. Questions regarding the procedure were encouraged and answered. The patient understands and consents to the procedure.  The back was prepped with Betadine in a sterile fashion, and a sterile drape was applied covering the operative field. A sterile gown and sterile gloves were used for the procedure.  Under sonographic guidance, a 22 gauge Chiba needle was inserted into a posterior lower pole calyx. Contrast was injected. It was removed over a 018 wire which was up sized to a 3 J. A 10 French dilator followed by a 57 French nephrostomy was advanced over the wire and coiled in the renal pelvis. Contrast was injected. It was sewn to the skin.  FINDINGS: Images document access into the left kidney collecting system via posterior lower pole calices. The final image demonstrates a 10 French nephrostomy coiled in the left renal pelvis.  COMPLICATIONS: None  IMPRESSION: Successful left nephrostomy.   Electronically Signed   By: Maryclare Bean M.D.   On: 05/03/2014 12:19   I have reviewed his recent notes, labs and nephrostomy films.   Assessment:  #1 Gross hematuria secondary to left renal  and ureteral stones with thrombocytopenia.   #2 ARI has improved further with the NT placement.  #3 GPC from left kidney now on vanc and culture is pending.   Plan: Continue irrigation prn for the foley and NT.  He needs to have a left percutaneous nephrolithotomy but it is not safe to do that in the face of the Thrombocytopenia.   I would like to arrange this to be done on a more elective basis at a later date.  He needs maximal therapy for the platelet dysfunction to see if the bleeding will stop.  He could potentially be discharged with the NT and foley once he is no longer bleeding.  If the bleeding is only temporarily controlled with platelet transfusion, we may need to bite the bullet and proceed with stone removal immediately post transfusion.   I am going to work of finding a time to schedule the procedure but it is unlikely to be this week.   Continue foley drainage for now because of the ongoing bleeding.        LOS: 7 days    Daleysa Kristiansen J 05/05/2014

## 2014-05-05 NOTE — Progress Notes (Signed)
Pharmacy Consult  - IV iron  HgB down to 6.8 Target HgB at least 12  Plan: Iron test dose of 25 mg then 1500 mg iv  X 1 Follow up AM  Thank you. Anette Guarneri, PharmD [phone]

## 2014-05-05 NOTE — Progress Notes (Signed)
INITIAL NUTRITION ASSESSMENT  DOCUMENTATION CODES Per approved criteria  -Obesity Unspecified   INTERVENTION: No nutrition intervention at this time --- patient declined RD to follow for nutrition care plan  NUTRITION DIAGNOSIS: Increased nutrient needs related to catabolic illness as evidenced by estimated nutrition needs  Goal: Pt to meet >/= 90% of their estimated nutrition needs   Monitor:  PO intake, weight, labs, I/O's  Reason for Assessment: Consult  63 y.o. male  Admitting Dx: CKD, severe anemia   ASSESSMENT: 63 y.o. Male with PMH of metastatic colon cancer (receiving chemotherapy in [place]), anemia, CAD, nephrolithiasis, CKD stage III presented with hematuria and worsening renal function.  RD consulted for assessment of nutrition requirements/status.  Patient reports a good appetite.  No recent weight loss.  Declined addition of oral nutrition supplements.  No muscle or subcutaneous fat depletion noticed.  Height: Ht Readings from Last 1 Encounters:  05/05/14 5\' 10"  (1.778 m)    Weight: Wt Readings from Last 1 Encounters:  05/05/14 227 lb 15.3 oz (103.4 kg)    Ideal Body Weight: 166 lb  % Ideal Body Weight: 137%  Wt Readings from Last 10 Encounters:  05/05/14 227 lb 15.3 oz (103.4 kg)  04/28/14 220 lb (99.791 kg)  04/21/14 215 lb 9.6 oz (97.796 kg)  04/14/14 214 lb 9.6 oz (97.342 kg)  04/07/14 216 lb 6.4 oz (98.158 kg)  03/31/14 214 lb 9.6 oz (97.342 kg)  03/27/14 211 lb 14.4 oz (96.117 kg)  03/24/14 209 lb 4.8 oz (94.938 kg)  03/17/14 206 lb 8 oz (93.668 kg)  03/13/14 210 lb 3.2 oz (95.346 kg)    Usual Body Weight: 220 lb  % Usual Body Weight: 103%  BMI:  Body mass index is 32.71 kg/(m^2).  Estimated Nutritional Needs: Kcal: 2250-2450 Protein: 115-125 gm Fluid: 1200 ml  Skin: Intact  Diet Order: Diet renal W/1241mL fluid restriction  EDUCATION NEEDS: -No education needs identified at this time   Intake/Output Summary (Last  24 hours) at 05/05/14 1520 Last data filed at 05/05/14 1145  Gross per 24 hour  Intake 18001.33 ml  Output  16190 ml  Net 1811.33 ml    Labs:   Recent Labs Lab 05/01/14 0551  05/03/14 0500 05/04/14 0608 05/05/14 0525  NA 143  < > 138 137 137  K 3.2*  < > 3.7 3.5* 4.2  CL 111  < > 106 106 105  CO2 19  < > 20 20 21   BUN 57*  < > 40* 36* 34*  CREATININE 5.97*  < > 3.88* 3.22* 2.50*  CALCIUM 6.8*  < > 7.7* 7.5* 8.0*  PHOS 6.7*  --   --   --   --   GLUCOSE 113*  < > 80 153* 131*  < > = values in this interval not displayed.  CBG (last 3)   Recent Labs  05/04/14 0754 05/04/14 1027  GLUCAP 118* 138*    Scheduled Meds: . sodium chloride   Intravenous Once  . amoxicillin  250 mg Oral Q12H  . cyanocobalamin  1,000 mcg Intramuscular Q30 days  . folic acid  1 mg Oral Daily  . furosemide  40 mg Intravenous Once  . hydrocortisone cream  1 application Topical BID  . iron dextran (INFED/DEXFERRUM) infusion  1,500 mg Intravenous Once  . pantoprazole  40 mg Oral Daily  . sodium chloride  3 mL Intravenous Q12H    Continuous Infusions:   Past Medical History  Diagnosis Date  . Pancytopenia, acquired  12/04/2010  . B12 deficiency 12/07/2010  . Kidney stones   . CAD (coronary artery disease)   . Anemia   . History of radiation therapy 06/19/07 -07/27/07    whole pelvis  . Hx of radiation therapy 08/06/12,08/09/12,& 08/13/12    rul [id number]/59fx  . Cancer     Colon ca dx 2008 surg/rad/chemo  . Lung cancer   . FH: chemotherapy   . Myocardial infarction   . Rectal bleeding 09/20/2013    From recurrent rectal mass/recurrent rectal adenocarcinoma  . Acute blood loss anemia 09/27/2013  . 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  2.7 cm primary, moderately differentiated with 3/8 positive lymph nodes without LVI.  Surgery was on 04/22/2007 and was treated postoperatively with radiation and  concomitant capecitabine and then 4 cycles of a planned 6 with oxaliplatin and capecitabine.  His counts would not allow treatment of  . Hydronephrosis of left kidney 09/24/2013    Kidney stones; s/p cystoscopy and stent  . Gross hematuria 09/25/2103  . Lung metastases 07/09/2013  . Secondary malignancy of right lung 07/09/2013    Past Surgical History  Procedure Laterality Date  . Ileostomy  12/07/2010    Procedure: ILEOSTOMY;  Surgeon: Donato Heinz;  Location: [facility];  Service: General;  Laterality: N/A;  Diverting Ileostomy Lysis of adhesions Exploratory Laparotomy  . Lung biopsy  10/27/10    rt lobe- adenocarcinoma  . Cholecystectomy    . Cataract extraction w/ intraocular lens implant  2007    bil  . Lithotripsy  2002  . Coronary angioplasty with stent placement  2003    stenting x 2  . Colon resection  2008    x2  . Esophagogastroduodenoscopy N/A 05/02/2013    Dr. Gala Romney- polypoid antral fold, bx= reactive gastropathy  . Givens capsule study N/A 05/02/2013    Procedure: GIVENS CAPSULE STUDY;  Surgeon: Daneil Dolin, MD;  Location: [facility];  Service: Endoscopy;  Laterality: N/A;  . Colonoscopy N/A 09/20/2013    SLF:A near circumferential fungating mass with friable surfaces was found in the rectum.  ACTIVELY OOZING.  CLOTS SEEN IN LUMEN AND ASPIRATED.  Multiple biopsies were performed using cold forceps.  Thermal therapy(BICAP 7 Fr 25W) was used to ATTEMPT TO control bleeding.    HEMOSTASIS NOT ACHEIVED.  Marland Kitchen Cystoscopy w/ ureteral stent placement Left 09/24/2013    Procedure: CYSTOSCOPY WITH LEFT RETROGRADE PYELOGRAM; LEFT URETERAL STENT PLACEMENT; REMOVAL OF SMALL BLADDER CALCULI;  Surgeon: Marissa Nestle, MD;  Location: [facility];  Service: Urology;  Laterality: Left;  . Flexible sigmoidoscopy N/A 09/25/2013    BOF:BPZWCHENI exophytic rectal mass most likely representing recurrent adenocarcinoma status post biopsy    Arthur Holms, RD, LDN Pager #: [phone] After-Hours Pager  #: [phone]

## 2014-05-05 NOTE — Progress Notes (Signed)
[facility] PROGRESS NOTE  [name] KNL:[id number] DOB: 1951-05-12 DOA: 04/28/2014 PCP: [name], MD  Brief narrative 63 year old male with history of metastatic colon cancer receiving chemotherapy in [place], anemia, coronary artery disease, history of hydronephrosis status post stenting in April 2015, nephrolithiasis, chronic kidney disease stage III who was sent to the ED from his hematology office. Patient reports intermittent hematuria for of about 2-3 weeks duration. Few days back he thought he passed his ureteral stent while using the bathroom. When he presented to his oncologist for chemotherapy he was found to have progressively worsened renal function. His last normal creatinine was 1.67 about one month back which worsened to 7.1 on 11/16. He was followed up again on 11/23 by his oncologist and his creatinine had worsened to 12.47. A renal ultrasound was done which showed moderate bilateral hydronephrosis with nephrocalcinosis. On 11/30 he was found to have bilateral lower extremity purpura and increased leg swellings. Blood work done showed platelets of 52,000 and creatinine of 18.4. He was also found to be in metabolic acidosis with CO2 9 with high anion gap.  A CT scan of the abdomen done in 11/30 showed increasing moderate to severe right hydronephrosis and severe left hydronephrosis. Also showed a 0.9x0.2 cm calculus at the left UPJ and unchanged multiple bilateral renal calculi  Urology was consulted and recommended admission to [facility] initially but [facility].   Assessment/Plan: Acute on chronic kidney disease stage III secondary to obstructive uropathy with severe metabolic acidosis  -AG Metabolic acidosis resolved with improvement in renal fn and bicarb drip . Renal fn improving after floey placed on 11/30. -bedside cystoscopy performed by Dr Jeffie Pollock on 12/3 showing a large amount of old and fresh clot that  appears to have come from the left collecting system. There was a clot protruding from the ureteral orifice on the left which was left as is. S/p evacuation of the bladder clots with a 81fr [place] foley. His prostate was short and minimally obstructing and the bladder wall had no evidence of cancer or stones. Foley was removed but had to be placed again on 12/4 due to urinary retention. -creatinine improving. Renal signed off. -most likely source of bleeding are the left ureteral and renal stones,. evaluated by IR and placed rt sided PCN. Still having hematuria. reportedly has blood clots with difficulty flushing out. Urology recommends percutaneous nephrolithotomy possibly done electively once he is bleeding is more stable and platelets improve. Patient has been receiving both PRBC and platelets. He has ongoing thrombocytopenia possibly related to chemotherapy and liver cirrhosis and may not improve significantly even with transfusion. Urology will schedule a time for the procedure which will most likely be next week. -Continue bladder irrigation for now.   Severe Anemia and thrombocytopenia Secondary to active hematuria and blood clots. Also contributed by chemotherapy and metastatic colon cancer.  Patient has received 7 u PRBC since admission . received 6 units PRBC during 2 weeks prior to admission.  Hemoglobin of 6.8 after 2 units PRBC yesterday. ordered for IV dextran infusion. 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 Received 6 u platelets since admission without much improvement.  Appreciate hematology evaluation on 12/6  -I will order 2 more units of platelets today and one unit of PRBC and see for improvement.     Sepsis with enterococcus bacteriuria Patient febrile and hypotensive on the evening percutaneous nephrostomy was done. Urine culture from the percutaneous site growing  enterococcus. Urine culture from the foley sent.  Blood culture  with no growth to date. Patient now afebrile and no leukocytosis. Spoke with ID consult Dr. Bridget Hartshorn and recommend switching to oral amoxicillin and to continue antibiotic until he has his for lithotripsy done to avoid further sepsis.  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 Appreciate recommendations.  Palpable purpura Noted HSP/ plantar erythrodysthesia. Reports to be due to chemotherapy Apply topical cream  Protein calorie malnutrition, severe Nutrition consult  B/l leg edema Prn lasix   DVT prophylaxis: SCD  -Code Status: full code family Communication: none at bedside Disposition Plan: Continue step down monitoring for today   Consultants:  Dr Jeffie Pollock  Renal ( signed off)  IR  hematology  Procedures:  Cystoscopy on 12/3  left percutaneous nephrostomy on 12/5  Antibiotics:  IV vanco 12/5--12/7  Amoxicillin 12/7--  Subjective Patient seen and examined. Reports minimal pain over left flank. Reports feeling better  Objective: Filed Vitals:   05/05/14 0757  BP: 117/64  Pulse: 56  Temp: 97.5 F (36.4 C)  Resp: 20    Intake/Output Summary (Last 24 hours) at 05/05/14 1020 Last data filed at 05/05/14 0600  Gross per 24 hour  Intake 15661.33 ml  Output  15490 ml  Net 171.33 ml   Filed Weights   05/04/14 0513 05/04/14 1030 05/05/14 0340  Weight: 99.202 kg (218 lb 11.2 oz) 97.9 kg (215 lb 13.3 oz) 103.4 kg (227 lb 15.3 oz)    Exam:   General:  Lesion male in no acute distress  HEENT: Pallor present, moist mucosa  Chest: Clear breath sounds bilaterally, left Port-A-Cath  CVS: Normal S1-S2, no murmurs  Abdomen: Soft, nondistended, nontender, ileostomy status, left percutaneous nephrostomy draining bloody urine, Foley with blood mixed urine  Extremities: Palpable purpura over the dorsum of bilateral feet, trace edema  bilaterally  CNS: Alert and oriented    Data Reviewed: Basic Metabolic Panel:  Recent Labs Lab 05/01/14 0551 05/02/14 0545 05/03/14 0500 05/04/14 0608 05/05/14 0525  NA 143 143 138 137 137  K 3.2* 3.7 3.7 3.5* 4.2  CL 111 111 106 106 105  CO2 19 21 20 20 21   GLUCOSE 113* 84 80 153* 131*  BUN 57* 49* 40* 36* 34*  CREATININE 5.97* 4.82* 3.88* 3.22* 2.50*  CALCIUM 6.8* 7.3* 7.7* 7.5* 8.0*  PHOS 6.7*  --   --   --   --    Liver Function Tests:  Recent Labs Lab 04/30/14 0553 05/01/14 0551  AST 36  --   ALT 34  --   ALKPHOS 59  --   BILITOT 2.2*  --   PROT 4.7*  --   ALBUMIN 1.7* 1.8*   No results for input(s): LIPASE, AMYLASE in the last 168 hours. No results for input(s): AMMONIA in the last 168 hours. CBC:  Recent Labs Lab 04/29/14 1700  05/01/14 1820 05/02/14 0545 05/03/14 0500 05/04/14 0608 05/05/14 0525  WBC 7.1  < > 7.5 5.9 5.0 6.4 4.5  NEUTROABS 6.6  --   --   --   --   --   --   HGB 6.1*  < > 8.2* 7.5* 7.0* 5.5* 6.8*  HCT 18.7*  < > 24.5* 22.8* 20.9* 16.9* 20.3*  MCV 95.9  < > 91.4 93.1 95.4 96.6 91.0  PLT 31*  < > 38* 35* 30* 31* 44*  < > = values in  this interval not displayed. Cardiac Enzymes: No results for input(s): CKTOTAL, CKMB, CKMBINDEX, TROPONINI in the last 168 hours. BNP (last 3 results) No results for input(s): PROBNP in the last 8760 hours. CBG:  Recent Labs Lab 05/04/14 0754 05/04/14 1027  GLUCAP 118* 138*    Recent Results (from the past 240 hour(s))  Body fluid culture     Status: None   Collection Time: 05/03/14 10:03 AM  Result Value Ref Range Status   Specimen Description FLUID LEFT KIDNEY  Final   Special Requests NONE  Final   Gram Stain   Final    ABUNDANT WBC PRESENT, PREDOMINANTLY PMN MODERATE GRAM POSITIVE COCCI IN PAIRS AND CHAINS Gram Stain Report Called to,Read Back By and Verified With: Gram Stain Report Called to,Read Back By and Verified With: Stefani Dama RN on 05/03/14 at 21:20 by Rise Mu Performed at [facility]    Culture   Final    ABUNDANT ENTEROCOCCUS SPECIES Performed at [facility]    Report Status 05/05/2014 FINAL  Final   Organism ID, Bacteria ENTEROCOCCUS SPECIES  Final      Susceptibility   Enterococcus species - MIC*    VANCOMYCIN 2 SENSITIVE Sensitive     AMPICILLIN <=2 SENSITIVE Sensitive     * ABUNDANT ENTEROCOCCUS SPECIES  Culture, blood (routine x 2)     Status: None (Preliminary result)   Collection Time: 05/03/14  7:26 PM  Result Value Ref Range Status   Specimen Description BLOOD LEFT ARM  Final   Special Requests BOTTLES DRAWN AEROBIC AND ANAEROBIC 5CC EACH  Final   Culture  Setup Time   Final    05/04/2014 03:55 Performed at [facility]    Culture   Final           BLOOD CULTURE RECEIVED NO GROWTH TO DATE CULTURE WILL BE HELD FOR 5 DAYS BEFORE ISSUING A FINAL NEGATIVE REPORT Performed at [facility]    Report Status PENDING  Incomplete  Culture, blood (routine x 2)     Status: None (Preliminary result)   Collection Time: 05/03/14  7:50 PM  Result Value Ref Range Status   Specimen Description BLOOD RIGHT ANTECUBITAL  Final   Special Requests BOTTLES DRAWN AEROBIC AND ANAEROBIC 10CC EACH  Final   Culture  Setup Time   Final    05/04/2014 03:55 Performed at [facility]    Culture   Final           BLOOD CULTURE RECEIVED NO GROWTH TO DATE CULTURE WILL BE HELD FOR 5 DAYS BEFORE ISSUING A FINAL NEGATIVE REPORT Performed at [facility]    Report Status PENDING  Incomplete  MRSA PCR Screening     Status: None   Collection Time: 05/04/14 10:39 AM  Result Value Ref Range Status   MRSA by PCR NEGATIVE NEGATIVE Final    Comment:        The GeneXpert MRSA Assay (FDA approved for NASAL specimens only), is one component of a comprehensive MRSA colonization surveillance program. It is not intended to diagnose MRSA infection nor to guide or monitor treatment for MRSA infections.       Studies: No results found.  Scheduled Meds: . cyanocobalamin  1,000 mcg Intramuscular Q30 days  . folic acid  1 mg Oral Daily  . hydrocortisone cream  1 application Topical BID  . iron dextran (INFED/DEXFERRUM) infusion  1,500 mg Intravenous Once  . pantoprazole  40 mg Oral Daily  .  sodium chloride  3 mL Intravenous Q12H  . vancomycin  1,000 mg Intravenous Q24H   Continuous Infusions:     Time spent: 25 minutes    Iantha Titsworth, [place]  [facility] Pager [phone] If 7PM-7AM, please contact night-coverage at [email or web address], password [facility] 05/05/2014, 10:20 AM  LOS: 7 days

## 2014-05-06 LAB — BASIC METABOLIC PANEL
Anion gap: 12 (ref 5–15)
BUN: 33 mg/dL — ABNORMAL HIGH (ref 6–23)
CO2: 21 meq/L (ref 19–32)
Calcium: 7.9 mg/dL — ABNORMAL LOW (ref 8.4–10.5)
Chloride: 109 mEq/L (ref 96–112)
Creatinine, Ser: 2.41 mg/dL — ABNORMAL HIGH (ref 0.50–1.35)
GFR calc Af Amer: 32 mL/min — ABNORMAL LOW (ref >90)
GFR, EST NON AFRICAN AMERICAN: 27 mL/min — AB (ref >90)
Glucose, Bld: 100 mg/dL — ABNORMAL HIGH (ref 70–99)
POTASSIUM: 3.4 meq/L — AB (ref 3.7–5.3)
SODIUM: 142 meq/L (ref 137–147)

## 2014-05-06 LAB — TYPE AND SCREEN
ABO/RH(D): A POS
Antibody Screen: NEGATIVE
UNIT DIVISION: 0
UNIT DIVISION: 0
UNIT DIVISION: 0
Unit division: 0

## 2014-05-06 LAB — PREPARE PLATELET PHERESIS
UNIT DIVISION: 0
Unit division: 0
Unit division: 0
Unit division: 0

## 2014-05-06 LAB — CBC
HCT: 22.3 % — ABNORMAL LOW (ref 39.0–52.0)
HEMOGLOBIN: 7.4 g/dL — AB (ref 13.0–17.0)
MCH: 29.6 pg (ref 26.0–34.0)
MCHC: 33.2 g/dL (ref 30.0–36.0)
MCV: 89.2 fL (ref 78.0–100.0)
Platelets: 64 10*3/uL — ABNORMAL LOW (ref 150–400)
RBC: 2.5 MIL/uL — AB (ref 4.22–5.81)
RDW: 19 % — ABNORMAL HIGH (ref 11.5–15.5)
WBC: 5.7 10*3/uL (ref 4.0–10.5)

## 2014-05-06 MED ORDER — POTASSIUM CHLORIDE CRYS ER 20 MEQ PO TBCR
40.0000 meq | EXTENDED_RELEASE_TABLET | Freq: Once | ORAL | Status: AC
  Administered 2014-05-06: 40 meq via ORAL
  Filled 2014-05-06: qty 2

## 2014-05-06 NOTE — Progress Notes (Signed)
Patient ID: EFREM PITSTICK, male   DOB: 11-17-1950, 63 y.o.   MRN: [id number]    Subjective: Mr. Wingard is without major complaints today.  His nephrostomy continues to drain but remains somewhat blood and has required occasional irrigation.  His foley drainage is pink on moderate CBI.  His platelet count is up to 64 with transfusion and his Hgb is 7.4.  The Cr is down further to 2.41. ROS:  Review of Systems  Constitutional: Negative for fever.  Gastrointestinal: Negative for nausea.  Genitourinary: Negative for flank pain.    Anti-infectives: Anti-infectives    Start     Dose/Rate Route Frequency Ordered Stop   05/05/14 1300  amoxicillin (AMOXIL) capsule 250 mg     250 mg Oral Every 12 hours 05/05/14 1227     05/05/14 1215  amoxicillin (AMOXIL) chewable tablet 250 mg  Status:  Discontinued     250 mg Oral Every 12 hours 05/05/14 1214 05/05/14 1227   05/03/14 2200  vancomycin (VANCOCIN) IVPB 1000 mg/200 mL premix  Status:  Discontinued     1,000 mg200 mL/hr over 60 Minutes Intravenous Every 24 hours 05/03/14 2151 05/05/14 1214   05/03/14 1200  ceFAZolin (ANCEF) IVPB 2 g/50 mL premix     2 g100 mL/hr over 30 Minutes Intravenous On call 05/02/14 1254 05/03/14 1011   05/03/14 0914  ceFAZolin (ANCEF) 2-3 GM-% IVPB SOLR    Comments:  Shaaron Adler   : cabinet override      05/03/14 0914 05/03/14 2114      Current Facility-Administered Medications  Medication Dose Route Frequency Provider Last Rate Last Dose  . acetaminophen (TYLENOL) tablet 650 mg  650 mg Oral Q6H PRN Nishant Dhungel, MD   650 mg at 05/03/14 1810  . amoxicillin (AMOXIL) capsule 250 mg  250 mg Oral Q12H Nishant Dhungel, MD   250 mg at 05/05/14 2137  . cyanocobalamin ((VITAMIN B-12)) injection 1,000 mcg  1,000 mcg Intramuscular Q30 days Shanda Howells, MD   1,000 mcg at 04/29/14 0947  . folic acid (FOLVITE) tablet 1 mg  1 mg Oral Daily Shanda Howells, MD   1 mg at 05/05/14 1005  . HYDROcodone-acetaminophen (NORCO/VICODIN)  5-325 MG per tablet 1-2 tablet  1-2 tablet Oral Q6H PRN Shanda Howells, MD   2 tablet at 05/05/14 2339  . hydrocortisone cream 1 % 1 application  1 application Topical BID Shanda Howells, MD   1 application at 40/98/11 2139  . hydrocortisone cream 1 %   Topical TID PRN Carmin Muskrat, MD      . HYDROmorphone (DILAUDID) injection 2 mg  2 mg Intravenous Q4H PRN Louellen Molder, MD   2 mg at 05/04/14 2149  . nystatin (MYCOSTATIN/NYSTOP) topical powder   Topical BID PRN Nishant Dhungel, MD      . ondansetron (ZOFRAN) tablet 8 mg  8 mg Oral BID PRN Shanda Howells, MD      . pantoprazole (PROTONIX) EC tablet 40 mg  40 mg Oral Daily Shanda Howells, MD   40 mg at 05/05/14 1005  . sodium chloride 0.9 % injection 10-40 mL  10-40 mL Intracatheter PRN Nishant Dhungel, MD   10 mL at 05/04/14 2200  . sodium chloride 0.9 % injection 3 mL  3 mL Intravenous Q12H Shanda Howells, MD   3 mL at 05/05/14 2138   Facility-Administered Medications Ordered in Other Encounters  Medication Dose Route Frequency Provider Last Rate Last Dose  . 0.9 %  sodium chloride infusion   Intravenous  Continuous Pieter Partridge, MD 20 mL/hr at 07/17/12 1113    . heparin lock flush 100 unit/mL  500 Units Intravenous Once Pieter Partridge, MD      . sodium chloride 0.9 % injection 10 mL  10 mL Intravenous PRN Pieter Partridge, MD   10 mL at 07/17/12 1113     Objective: Vital signs in last 24 hours: Temp:  [97.5 F (36.4 C)-98.6 F (37 C)] 97.9 F (36.6 C) (12/08 0358) Pulse Rate:  [56-70] 58 (12/08 0358) Resp:  [11-23] 13 (12/08 0358) BP: (105-129)/(40-88) 105/65 mmHg (12/08 0358) SpO2:  [99 %-100 %] 100 % (12/08 0358) Weight:  [104.327 kg (230 lb)] 104.327 kg (230 lb) (12/08 0500)  Intake/Output from previous day: 12/07 0701 - 12/08 0700 In: 10030 [Blood:1030] Out: 14200 [Urine:13700; Stool:500] Intake/Output this shift:     Physical Exam  Constitutional: He is oriented to person, place, and time and well-developed,  well-nourished, and in no distress.  Neurological: He is alert and oriented to person, place, and time.    Lab Results:   Recent Labs  05/05/14 0525 05/06/14 0400  WBC 4.5 5.7  HGB 6.8* 7.4*  HCT 20.3* 22.3*  PLT 44* 64*   BMET  Recent Labs  05/05/14 0525 05/06/14 0400  NA 137 142  K 4.2 3.4*  CL 105 109  CO2 21 21  GLUCOSE 131* 100*  BUN 34* 33*  CREATININE 2.50* 2.41*  CALCIUM 8.0* 7.9*   PT/INR No results for input(s): LABPROT, INR in the last 72 hours. ABG No results for input(s): PHART, HCO3 in the last 72 hours.  Invalid input(s): PCO2, PO2  Studies/Results: No results found.  Recent lab work reviewed.   Assessment: Scheduled for LEFT NEPHROLITHOTOMY PERCUTANEOUS FIRST STAGE HOLMIUM LASER APPLICATION On 28/31 at [facility] for removal of his stones.   He appears to have decreased bleeding with the platelet transfusions.   Renal function continues to improve.  Plan: I have him scheduled for next week for the Left PCNL and he will need to probably have a platelet transfusion the morning of the procedure.  I reviewed the risks of bleeding, infection, injury to the kidney or adjacent organs, need for secondary procedures, thrombotic events and anesthetic complications.    He the bleeding resolves, he could be discharged home and return for the procedure, but it might be best for him to be admitted the night before to medicine so his medical condition can be optomized.      LOS: 8 days    Amando Ishikawa J 05/06/2014

## 2014-05-06 NOTE — Progress Notes (Signed)
Subjective: Patient reports that he is having no bladder pain, but he has not voided since his catheter was removed quite a few hours ago.  Objective: Vital signs in last 24 hours: Temp:  [97.2 F (36.2 C)-98.6 F (37 C)] 97.9 F (36.6 C) (12/08 1733) Pulse Rate:  [58-70] 60 (12/08 1244) Resp:  [10-23] 15 (12/08 1244) BP: (102-134)/(40-93) 102/51 mmHg (12/08 1244) SpO2:  [99 %-100 %] 100 % (12/08 1244) Weight:  [104.327 kg (230 lb)] 104.327 kg (230 lb) (12/08 0500)  Intake/Output from previous day: 12/07 0701 - 12/08 0700 In: 13130 [Blood:1030] Out: 14300 [VQQVZ:56387; Stool:600] Intake/Output this shift: Total I/O In: 3865 [P.O.:760; Other:3105] Out: 5643 [PIRJJ:8841; Stool:250]  Physical Exam:  Constitutional: Vital signs reviewed. WD WN in NAD   Eyes: PERRL, No scleral icterus.   Pulmonary/Chest: Normal effort Abdominal: Soft. Non-tender, non-distended, bowel sounds are normal, no masses, organomegaly, or guarding present.    Lab Results:  Recent Labs  05/04/14 0608 05/05/14 0525 05/06/14 0400  HGB 5.5* 6.8* 7.4*  HCT 16.9* 20.3* 22.3*   BMET  Recent Labs  05/05/14 0525 05/06/14 0400  NA 137 142  K 4.2 3.4*  CL 105 109  CO2 21 21  GLUCOSE 131* 100*  BUN 34* 33*  CREATININE 2.50* 2.41*  CALCIUM 8.0* 7.9*   No results for input(s): LABPT, INR in the last 72 hours. No results for input(s): LABURIN in the last 72 hours. Results for orders placed or performed during the hospital encounter of 04/28/14  Body fluid culture     Status: None   Collection Time: 05/03/14 10:03 AM  Result Value Ref Range Status   Specimen Description FLUID LEFT KIDNEY  Final   Special Requests NONE  Final   Gram Stain   Final    ABUNDANT WBC PRESENT, PREDOMINANTLY PMN MODERATE GRAM POSITIVE COCCI IN PAIRS AND CHAINS Gram Stain Report Called to,Read Back By and Verified With: Gram Stain Report Called to,Read Back By and Verified With: Stefani Dama RN on 05/03/14 at 21:20 by  Rise Mu Performed at [facility]    Culture   Final    ABUNDANT ENTEROCOCCUS SPECIES Performed at [facility]    Report Status 05/05/2014 FINAL  Final   Organism ID, Bacteria ENTEROCOCCUS SPECIES  Final      Susceptibility   Enterococcus species - MIC*    VANCOMYCIN 2 SENSITIVE Sensitive     AMPICILLIN <=2 SENSITIVE Sensitive     * ABUNDANT ENTEROCOCCUS SPECIES  Culture, blood (routine x 2)     Status: None (Preliminary result)   Collection Time: 05/03/14  7:26 PM  Result Value Ref Range Status   Specimen Description BLOOD LEFT ARM  Final   Special Requests BOTTLES DRAWN AEROBIC AND ANAEROBIC 5CC EACH  Final   Culture  Setup Time   Final    05/04/2014 03:55 Performed at [facility]    Culture   Final           BLOOD CULTURE RECEIVED NO GROWTH TO DATE CULTURE WILL BE HELD FOR 5 DAYS BEFORE ISSUING A FINAL NEGATIVE REPORT Performed at [facility]    Report Status PENDING  Incomplete  Culture, blood (routine x 2)     Status: None (Preliminary result)   Collection Time: 05/03/14  7:50 PM  Result Value Ref Range Status   Specimen Description BLOOD RIGHT ANTECUBITAL  Final   Special Requests BOTTLES DRAWN AEROBIC AND ANAEROBIC 10CC EACH  Final   Culture  Setup Time   Final    05/04/2014 03:55 Performed at [facility]    Culture   Final           BLOOD CULTURE RECEIVED NO GROWTH TO DATE CULTURE WILL BE HELD FOR 5 DAYS BEFORE ISSUING A FINAL NEGATIVE REPORT Performed at [facility]    Report Status PENDING  Incomplete  Urine culture     Status: None   Collection Time: 05/03/14  8:29 PM  Result Value Ref Range Status   Specimen Description URINE, RANDOM  Final   Special Requests ADDED 1940 05/04/14  Final   Culture  Setup Time   Final    05/04/2014 20:32 Performed at [place] Performed at [facility]   Final   Culture NO GROWTH Performed at [facility]   Final    Report Status 05/05/2014 FINAL  Final  MRSA PCR Screening     Status: None   Collection Time: 05/04/14 10:39 AM  Result Value Ref Range Status   MRSA by PCR NEGATIVE NEGATIVE Final    Comment:        The GeneXpert MRSA Assay (FDA approved for NASAL specimens only), is one component of a comprehensive MRSA colonization surveillance program. It is not intended to diagnose MRSA infection nor to guide or monitor treatment for MRSA infections.     Studies/Results: No results found.  Assessment/Plan:   Gross hematuria, currently with clot retention. His renal failure is improving somewhat    I spoke with the patient and his wife. I think it worthwhile to replace a Foley catheter, and hooked this up to CBI. That will be done later today.   LOS: 8 days   Franchot Gallo M 05/06/2014, 6:22 PM

## 2014-05-06 NOTE — Evaluation (Signed)
Physical Therapy Evaluation Patient Details Name: [name] MRN: [id number] DOB: [birth year] Today's Date: 05/06/2014   History of Present Illness  63 year-old male with history of metastatic colon cancer, anemia, coronary artery disease, history of hydronephrosis status post stenting in April 2015, nephrolithiasis, chronic kidney disease stage III who was sent to the ED from his hematology office with Gross Hematuria with Anemia, Thrombocytopenia; Patient now s/p left neph tube for drainage 12/5. Long h/o nephrolithiasis.  Clinical Impression  Patient demonstrates deficits in functional mobility as indicated below. Will need continued skilled PT to address deficits and maximize function. Will see as indicated and progress as tolerated. Anticipate patient will progress well with mobility.    Follow Up Recommendations No PT follow up;Supervision/Assistance - 24 hour    Equipment Recommendations  None recommended by PT    Recommendations for Other Services       Precautions / Restrictions Precautions Precautions: Fall Restrictions Weight Bearing Restrictions: No      Mobility  Bed Mobility Overal bed mobility: Modified Independent             General bed mobility comments: increased time to perform  Transfers Overall transfer level: Needs assistance Equipment used: 2 person hand held assist Transfers: Sit to/from [facility] Sit to Stand: Min assist Stand pivot transfers: Min assist       General transfer comment: min assist for stability during transfer, assist for line management.   Ambulation/Gait                Stairs            Wheelchair Mobility    Modified Rankin (Stroke Patients Only)       Balance                                             Pertinent Vitals/Pain Pain Assessment: No/denies pain    Home Living Family/patient expects to be discharged to:: Private residence Living Arrangements:  Spouse/significant other Available Help at Discharge: Family Type of Home: Mobile home Home Access: Stairs to enter Entrance Stairs-Rails: Can reach both Entrance Stairs-Number of Steps: 4 Home Layout: One level Home Equipment: [place] - 2 wheels;Cane - single point      Prior Function Level of Independence: Independent               Hand Dominance   Dominant Hand: Right    Extremity/Trunk Assessment               Lower Extremity Assessment: Overall WFL for tasks assessed (history of peripheral neuropathy)         Communication   Communication: No difficulties  Cognition Arousal/Alertness: Awake/alert Behavior During Therapy: WFL for tasks assessed/performed Overall Cognitive Status: Within Functional Limits for tasks assessed                      General Comments      Exercises        Assessment/Plan    PT Assessment Patient needs continued PT services  PT Diagnosis Difficulty walking   PT Problem List Decreased activity tolerance;Decreased mobility  PT Treatment Interventions DME instruction;Gait training;Stair training;Functional mobility training;Therapeutic activities;Therapeutic exercise;Balance training;Patient/family education   PT Goals (Current goals can be found in the Care Plan section) Acute Rehab PT Goals Patient Stated Goal: to go home PT Goal Formulation:  With patient/family Time For Goal Achievement: 05/13/14 Potential to Achieve Goals: Good    Frequency Min 3X/week   Barriers to discharge        Co-evaluation               End of Session   Activity Tolerance: Patient tolerated treatment well Patient left: in chair;with call bell/phone within reach;with family/visitor present Nurse Communication: Mobility status         Time: 1010-1031 PT Time Calculation (min) (ACUTE ONLY): 21 min   Charges:   PT Evaluation $Initial PT Evaluation Tier I: 1 Procedure PT Treatments $Therapeutic Activity: 8-22 mins    PT G CodesDuncan Dull 05/06/2014, 1:21 PM Alben Deeds, [place] DPT  [phone]

## 2014-05-06 NOTE — Progress Notes (Signed)
[facility] PROGRESS NOTE  [name] TKW:[id number] DOB: 12-15-1950 DOA: 04/28/2014 PCP: [name], MD  Brief narrative 63 year old male with history of metastatic colon cancer receiving chemotherapy in [place], anemia, coronary artery disease, history of hydronephrosis status post stenting in April 2015, nephrolithiasis, chronic kidney disease stage III who was sent to the ED from his hematology office. Patient reports intermittent hematuria for of about 2-3 weeks duration. Few days back he thought he passed his ureteral stent while using the bathroom. When he presented to his oncologist for chemotherapy he was found to have progressively worsened renal function. His last normal creatinine was 1.67 about one month back which worsened to 7.1 on 11/16. He was followed up again on 11/23 by his oncologist and his creatinine had worsened to 12.47. A renal ultrasound was done which showed moderate bilateral hydronephrosis with nephrocalcinosis. On 11/30 he was found to have bilateral lower extremity purpura and increased leg swellings. Blood work done showed platelets of 52,000 and creatinine of 18.4. He was also found to be in metabolic acidosis with CO2 9 with high anion gap.  A CT scan of the abdomen done in 11/30 showed increasing moderate to severe right hydronephrosis and severe left hydronephrosis. Also showed a 0.9x0.2 cm calculus at the left UPJ and unchanged multiple bilateral renal calculi  Urology was consulted and recommended [facility] but [facility].   Assessment/Plan: Acute on chronic kidney disease stage III secondary to obstructive uropathy with severe metabolic acidosis  -AG Metabolic acidosis resolved with improvement in renal fn and bicarb drip . Renal fn improving after floey placed on 11/30. -bedside cystoscopy performed by Dr Jeffie Pollock on 12/3 showing a large amount of old and fresh clot that  appears to have come from the left collecting system. There was a clot protruding from the ureteral orifice on the left which was left as is. S/p evacuation of the bladder clots with a 15fr Ainsworth foley. His prostate was short and minimally obstructing and the bladder wall had no evidence of cancer or stones. Foley was removed but had to be placed again on 12/4 due to urinary retention. -. Renal signed off. -most likely source of bleeding are the left ureteral and renal stones,. IR placed rt sided PCN. Still having hematuria. reportedly has blood clots with difficulty flushing out. Continue bladder irrigation.  -Urology scheduled for inpatient versus outpatient percutaneous nephrolithotomy on 12/15. Patient discharged prior to that will be admitted the previous night to hospitalist service and should received 2 units of platelets early morning to procedure..      Severe Anemia and thrombocytopenia Secondary to active hematuria and blood clots. Also contributed by chemotherapy and metastatic colon cancer. Patient has been receiving both PRBC and platelets. He has ongoing thrombocytopenia possibly related to chemotherapy and liver cirrhosis and improving minimally with transfusion..  Patient has received 9 u PRBC since admission . received 6 units PRBC during 2 weeks prior to admission. ( 15 units total in past 15 days) -Received IV dextran infusion on 12/7. Hemoglobin of 7.4 today   Received 8 u platelets since admission with some improvement today. Appreciate hematology evaluation on 12/6      Sepsis with enterococcus bacteriuria Patient febrile and hypotensive on the evening percutaneous nephrostomy was done (12/5). Urine culture from the percutaneous site growing enterococcus. Urine culture from the foley sent.  Blood culture with no growth to date. Spoke with ID consult Dr. Bridget Hartshorn and recommend  switching to oral amoxicillin and to continue antibiotic until he has his for  lithotripsy done to avoid further sepsis. Since lithotripsy is planned for next week we will treat him for a 2 weeks course. 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 Appreciate recommendations. Follows with oncology in [place].  Palpable purpura Noted HSP/ plantar erythrodysthesia. Reports to be due to chemotherapy Apply topical cream  Protein calorie malnutrition, severe Nutrition consult  B/l leg edema Prn lasix   DVT prophylaxis: SCD  -Code Status: full code family Communication: Wife at bedside Disposition Plan: Transfer to medical floor if stable overnight. If bleeding resolves and his H&H and platelets remain stable, he can be discharged home in the next 48 hours and be [facility] on 12/14 for percutaneous nephrolithotomy. It is possible patient may remain inpatient until then due to ongoing hematuria with anemia and thrombocytopenia.   Consultants:  Dr Jeffie Pollock  Renal ( signed off)  IR  hematology  Procedures:  Cystoscopy on 12/3  left percutaneous nephrostomy on 12/5  Antibiotics:  IV vanco 12/5--12/7  Oral  Amoxicillin 12/7-- until 12/21  Subjective Patient seen and examined. Feels much better today. Was able to participate in physical therapy. Reports some pain in his left flank.  Objective: Filed Vitals:   05/06/14 1244  BP: 102/51  Pulse: 60  Temp: 98 F (36.7 C)  Resp: 15    Intake/Output Summary (Last 24 hours) at 05/06/14 1331 Last data filed at 05/06/14 1325  Gross per 24 hour  Intake  13725 ml  Output  16625 ml  Net  -2900 ml   Filed Weights   05/04/14 1030 05/05/14 0340 05/06/14 0500  Weight: 97.9 kg (215 lb 13.3 oz) 103.4 kg (227 lb 15.3 oz) 104.327 kg (230 lb)    Exam:   General: Lesion male in no acute distress  HEENT: Pallor present, moist mucosa  Chest:  Clear breath sounds bilaterally, left Port-A-Cath  CVS: Normal S1-S2, no murmurs  Abdomen: Soft, nondistended, nontender, ileostomy status, left percutaneous nephrostomy draining bloody urine, Foley with blood mixed urine  Extremities: Palpable purpura over the dorsum of bilateral feet, trace edema bilaterally  CNS: Alert and oriented  Data Reviewed: Basic Metabolic Panel:  Recent Labs Lab 05/01/14 0551 05/02/14 0545 05/03/14 0500 05/04/14 0608 05/05/14 0525 05/06/14 0400  NA 143 143 138 137 137 142  K 3.2* 3.7 3.7 3.5* 4.2 3.4*  CL 111 111 106 106 105 109  CO2 19 21 20 20 21 21   GLUCOSE 113* 84 80 153* 131* 100*  BUN 57* 49* 40* 36* 34* 33*  CREATININE 5.97* 4.82* 3.88* 3.22* 2.50* 2.41*  CALCIUM 6.8* 7.3* 7.7* 7.5* 8.0* 7.9*  PHOS 6.7*  --   --   --   --   --    Liver Function Tests:  Recent Labs Lab 04/30/14 0553 05/01/14 0551  AST 36  --   ALT 34  --   ALKPHOS 59  --   BILITOT 2.2*  --   PROT 4.7*  --   ALBUMIN 1.7* 1.8*   No results for input(s): LIPASE, AMYLASE in the last 168 hours. No results for input(s): AMMONIA in the last 168 hours. CBC:  Recent Labs Lab 04/29/14 1700  05/02/14 0545 05/03/14 0500 05/04/14 0608 05/05/14 0525 05/06/14 0400  WBC 7.1  < > 5.9 5.0 6.4 4.5 5.7  NEUTROABS 6.6  --   --   --   --   --   --  HGB 6.1*  < > 7.5* 7.0* 5.5* 6.8* 7.4*  HCT 18.7*  < > 22.8* 20.9* 16.9* 20.3* 22.3*  MCV 95.9  < > 93.1 95.4 96.6 91.0 89.2  PLT 31*  < > 35* 30* 31* 44* 64*  < > = values in this interval not displayed. Cardiac Enzymes: No results for input(s): CKTOTAL, CKMB, CKMBINDEX, TROPONINI in the last 168 hours. BNP (last 3 results) No results for input(s): PROBNP in the last 8760 hours. CBG:  Recent Labs Lab 05/04/14 0754 05/04/14 1027  GLUCAP 118* 138*    Recent Results (from the past 240 hour(s))  Body fluid culture     Status: None   Collection Time: 05/03/14 10:03 AM  Result Value Ref Range Status   Specimen  Description FLUID LEFT KIDNEY  Final   Special Requests NONE  Final   Gram Stain   Final    ABUNDANT WBC PRESENT, PREDOMINANTLY PMN MODERATE GRAM POSITIVE COCCI IN PAIRS AND CHAINS Gram Stain Report Called to,Read Back By and Verified With: Gram Stain Report Called to,Read Back By and Verified With: Stefani Dama RN on 05/03/14 at 21:20 by Rise Mu Performed at [facility]   Final    ABUNDANT ENTEROCOCCUS SPECIES Performed at [facility]    Report Status 05/05/2014 FINAL  Final   Organism ID, Bacteria ENTEROCOCCUS SPECIES  Final      Susceptibility   Enterococcus species - MIC*    VANCOMYCIN 2 SENSITIVE Sensitive     AMPICILLIN <=2 SENSITIVE Sensitive     * ABUNDANT ENTEROCOCCUS SPECIES  Culture, blood (routine x 2)     Status: None (Preliminary result)   Collection Time: 05/03/14  7:26 PM  Result Value Ref Range Status   Specimen Description BLOOD LEFT ARM  Final   Special Requests BOTTLES DRAWN AEROBIC AND ANAEROBIC 5CC EACH  Final   Culture  Setup Time   Final    05/04/2014 03:55 Performed at [facility]    Culture   Final           BLOOD CULTURE RECEIVED NO GROWTH TO DATE CULTURE WILL BE HELD FOR 5 DAYS BEFORE ISSUING A FINAL NEGATIVE REPORT Performed at [facility]    Report Status PENDING  Incomplete  Culture, blood (routine x 2)     Status: None (Preliminary result)   Collection Time: 05/03/14  7:50 PM  Result Value Ref Range Status   Specimen Description BLOOD RIGHT ANTECUBITAL  Final   Special Requests BOTTLES DRAWN AEROBIC AND ANAEROBIC 10CC EACH  Final   Culture  Setup Time   Final    05/04/2014 03:55 Performed at [facility]    Culture   Final           BLOOD CULTURE RECEIVED NO GROWTH TO DATE CULTURE WILL BE HELD FOR 5 DAYS BEFORE ISSUING A FINAL NEGATIVE REPORT Performed at [facility]    Report Status PENDING  Incomplete  Urine culture     Status: None   Collection Time: 05/03/14  8:29 PM   Result Value Ref Range Status   Specimen Description URINE, RANDOM  Final   Special Requests ADDED 1940 05/04/14  Final   Culture  Setup Time   Final    05/04/2014 20:32 Performed at [place] Performed at [facility]   Final   Culture NO GROWTH Performed at [facility]   Final  Report Status 05/05/2014 FINAL  Final  MRSA PCR Screening     Status: None   Collection Time: 05/04/14 10:39 AM  Result Value Ref Range Status   MRSA by PCR NEGATIVE NEGATIVE Final    Comment:        The GeneXpert MRSA Assay (FDA approved for NASAL specimens only), is one component of a comprehensive MRSA colonization surveillance program. It is not intended to diagnose MRSA infection nor to guide or monitor treatment for MRSA infections.      Studies: No results found.  Scheduled Meds: . amoxicillin  250 mg Oral Q12H  . cyanocobalamin  1,000 mcg Intramuscular Q30 days  . folic acid  1 mg Oral Daily  . hydrocortisone cream  1 application Topical BID  . pantoprazole  40 mg Oral Daily  . sodium chloride  3 mL Intravenous Q12H   Continuous Infusions:      Time spent: 25 minutes    Millie Shorb, [place]  [facility] Pager [phone]. If 7PM-7AM, please contact night-coverage at [email or web address], password [facility] 05/06/2014, 1:31 PM  LOS: 8 days

## 2014-05-07 LAB — CBC
HCT: 20.2 % — ABNORMAL LOW (ref 39.0–52.0)
Hemoglobin: 6.7 g/dL — CL (ref 13.0–17.0)
MCH: 30.9 pg (ref 26.0–34.0)
MCHC: 33.2 g/dL (ref 30.0–36.0)
MCV: 93.1 fL (ref 78.0–100.0)
PLATELETS: 56 10*3/uL — AB (ref 150–400)
RBC: 2.17 MIL/uL — ABNORMAL LOW (ref 4.22–5.81)
RDW: 19.1 % — ABNORMAL HIGH (ref 11.5–15.5)
WBC: 3.4 10*3/uL — AB (ref 4.0–10.5)

## 2014-05-07 LAB — BASIC METABOLIC PANEL
ANION GAP: 10 (ref 5–15)
BUN: 27 mg/dL — ABNORMAL HIGH (ref 6–23)
CO2: 20 mEq/L (ref 19–32)
Calcium: 7.8 mg/dL — ABNORMAL LOW (ref 8.4–10.5)
Chloride: 107 mEq/L (ref 96–112)
Creatinine, Ser: 2.05 mg/dL — ABNORMAL HIGH (ref 0.50–1.35)
GFR calc Af Amer: 39 mL/min — ABNORMAL LOW (ref >90)
GFR, EST NON AFRICAN AMERICAN: 33 mL/min — AB (ref >90)
Glucose, Bld: 135 mg/dL — ABNORMAL HIGH (ref 70–99)
Potassium: 3.4 mEq/L — ABNORMAL LOW (ref 3.7–5.3)
SODIUM: 137 meq/L (ref 137–147)

## 2014-05-07 LAB — PREPARE RBC (CROSSMATCH)

## 2014-05-07 MED ORDER — SODIUM CHLORIDE 0.9 % IV SOLN
Freq: Once | INTRAVENOUS | Status: AC
  Administered 2014-05-07: via INTRAVENOUS
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
## 2014-05-07 NOTE — Progress Notes (Signed)
CRITICAL VALUE ALERT  Critical value received: Hgb 6.7  Date of notification:  05/07/2014  Time of notification: 1615  Critical value read back: yes  Nurse who received alert: Cristobal Goldmann  MD notified (1st page):  Dr Karleen Hampshire  Time of first page:  1643  MD notified (2nd page):  Time of second page:  Responding MD:  Dr Karleen Hampshire  Time MD responded:  [phone]

## 2014-05-07 NOTE — Progress Notes (Signed)
[facility] PROGRESS NOTE  MATE ALEGRIA FKC:[id number] DOB: 04/28/1951 DOA: 04/28/2014 PCP: [name], MD  Brief narrative 63 year old male with history of metastatic colon cancer receiving chemotherapy in [place], anemia, coronary artery disease, history of hydronephrosis status post stenting in April 2015, nephrolithiasis, chronic kidney disease stage III who was sent to the ED from his hematology office. Patient reports intermittent hematuria for of about 2-3 weeks duration. Few days back he thought he passed his ureteral stent while using the bathroom. When he presented to his oncologist for chemotherapy he was found to have progressively worsened renal function. His last normal creatinine was 1.67 about one month back which worsened to 7.1 on 11/16. He was followed up again on 11/23 by his oncologist and his creatinine had worsened to 12.47. A renal ultrasound was done which showed moderate bilateral hydronephrosis with nephrocalcinosis. On 11/30 he was found to have bilateral lower extremity purpura and increased leg swellings. Blood work done showed platelets of 52,000 and creatinine of 18.4. He was also found to be in metabolic acidosis with CO2 9 with high anion gap.  A CT scan of the abdomen done in 11/30 showed increasing moderate to severe right hydronephrosis and severe left hydronephrosis. Also showed a 0.9x0.2 cm calculus at the left UPJ and unchanged multiple bilateral renal calculi  Urology was consulted and recommended admission [facility] but [facility].   Assessment/Plan: Acute on chronic kidney disease stage III secondary to obstructive uropathy with severe metabolic acidosis  -AG Metabolic acidosis resolved with improvement in renal fn and bicarb drip . Renal fn improving after floey placed on 11/30. -bedside cystoscopy performed by Dr Jeffie Pollock on 12/3 showing a large amount of old and fresh clot that  appears to have come from the left collecting system. There was a clot protruding from the ureteral orifice on the left which was left as is. S/p evacuation of the bladder clots with a 65fr Ainsworth foley. His prostate was short and minimally obstructing and the bladder wall had no evidence of cancer or stones. Foley was removed but had to be placed again on 12/4 due to urinary retention. -. Renal signed off. -most likely source of bleeding are the left ureteral and renal stones,. IR placed rt sided PCN. Still having hematuria. reportedly has blood clots with difficulty flushing out. Continue bladder irrigation.  -Urology scheduled for inpatient versus outpatient percutaneous nephrolithotomy on 12/15. Patient discharged prior to that will be admitted the previous night to hospitalist service and should received 2 units of platelets early morning to procedure..      Severe Anemia and thrombocytopenia Secondary to active hematuria and blood clots. Also contributed by chemotherapy and metastatic colon cancer. Patient has been receiving both PRBC and platelets. He has ongoing thrombocytopenia possibly related to chemotherapy and liver cirrhosis and improving minimally with transfusion..  Patient has received 9 u PRBC since admission . received 6 units PRBC during 2 weeks prior to admission. ( 15 units total in past 15 days) -Received IV dextran infusion on 12/7. Hemoglobin of 7.4 on 12/8  Received 8 u platelets since admission with some improvement today. Appreciate hematology evaluation on 12/6 .       Sepsis with enterococcus bacteriuria Patient febrile and hypotensive on the evening percutaneous nephrostomy was done (12/5). Urine culture from the percutaneous site growing enterococcus. Urine culture from the foley sent.  Blood culture with no growth to date. Spoke with ID consult Dr. Bridget Hartshorn  and recommend switching to oral amoxicillin and to continue antibiotic until he has his for  lithotripsy done to avoid further sepsis. Since lithotripsy is planned for next week we will treat him for a 2 weeks course. 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 Appreciate recommendations. Follows with oncology in [place].  Palpable purpura Noted HSP/ plantar erythrodysthesia. Reports to be due to chemotherapy Apply topical cream  Protein calorie malnutrition, severe Nutrition consult  B/l leg edema Prn lasix  Mild hypokalemia  Will be repleted as needed. Repeat bmp ordered and pending.    DVT prophylaxis: SCD  -Code Status: full code family Communication: none at bedside Disposition Plan:transfer to telemetry.   If bleeding resolves and his H&H and platelets remain stable, he can be discharged home in the next 48 hours and be readmitted to [facility] on 12/14 for percutaneous nephrolithotomy. It is possible patient may remain inpatient until then due to ongoing hematuria with anemia and thrombocytopenia.   Consultants:  Dr Jeffie Pollock  Renal ( signed off)  IR  hematology  Procedures:  Cystoscopy on 12/3  left percutaneous nephrostomy on 12/5  Antibiotics:  IV vanco 12/5--12/7  Oral  Amoxicillin 12/7-- until 12/21  Subjective Patient seen and examined. Feels much better today. Reported that he walked in the hallway and feels much better.   Objective: Filed Vitals:   05/07/14 0802  BP:   Pulse:   Temp: 98.2 F (36.8 C)  Resp:     Intake/Output Summary (Last 24 hours) at 05/07/14 0926 Last data filed at 05/07/14 0804  Gross per 24 hour  Intake  29210 ml  Output  36270 ml  Net  -7060 ml   Filed Weights   05/05/14 0340 05/06/14 0500 05/07/14 0019  Weight: 103.4 kg (227 lb 15.3 oz) 104.327 kg (230 lb) 101.6 kg (223 lb 15.8 oz)    Exam:   General: Lesion male in no acute distress  HEENT: Pallor  present, moist mucosa  Chest: Clear breath sounds bilaterally, left Port-A-Cath  CVS: Normal S1-S2, no murmurs  Abdomen: Soft, nondistended, nontender, ileostomy status, left percutaneous nephrostomy draining bloody urine, Foley with blood mixed urine  Extremities: Palpable purpura over the dorsum of bilateral feet, trace edema bilaterally  CNS: Alert and oriented  Data Reviewed: Basic Metabolic Panel:  Recent Labs Lab 05/01/14 0551 05/02/14 0545 05/03/14 0500 05/04/14 0608 05/05/14 0525 05/06/14 0400  NA 143 143 138 137 137 142  K 3.2* 3.7 3.7 3.5* 4.2 3.4*  CL 111 111 106 106 105 109  CO2 19 21 20 20 21 21   GLUCOSE 113* 84 80 153* 131* 100*  BUN 57* 49* 40* 36* 34* 33*  CREATININE 5.97* 4.82* 3.88* 3.22* 2.50* 2.41*  CALCIUM 6.8* 7.3* 7.7* 7.5* 8.0* 7.9*  PHOS 6.7*  --   --   --   --   --    Liver Function Tests:  Recent Labs Lab 05/01/14 0551  ALBUMIN 1.8*   No results for input(s): LIPASE, AMYLASE in the last 168 hours. No results for input(s): AMMONIA in the last 168 hours. CBC:  Recent Labs Lab 05/02/14 0545 05/03/14 0500 05/04/14 0608 05/05/14 0525 05/06/14 0400  WBC 5.9 5.0 6.4 4.5 5.7  HGB 7.5* 7.0* 5.5* 6.8* 7.4*  HCT 22.8* 20.9* 16.9* 20.3* 22.3*  MCV 93.1 95.4 96.6 91.0 89.2  PLT 35* 30* 31* 44* 64*   Cardiac  Enzymes: No results for input(s): CKTOTAL, CKMB, CKMBINDEX, TROPONINI in the last 168 hours. BNP (last 3 results) No results for input(s): PROBNP in the last 8760 hours. CBG:  Recent Labs Lab 05/04/14 0754 05/04/14 1027  GLUCAP 118* 138*    Recent Results (from the past 240 hour(s))  Body fluid culture     Status: None   Collection Time: 05/03/14 10:03 AM  Result Value Ref Range Status   Specimen Description FLUID LEFT KIDNEY  Final   Special Requests NONE  Final   Gram Stain   Final    ABUNDANT WBC PRESENT, PREDOMINANTLY PMN MODERATE GRAM POSITIVE COCCI IN PAIRS AND CHAINS Gram Stain Report Called to,Read Back By and  Verified With: Gram Stain Report Called to,Read Back By and Verified With: Stefani Dama RN on 05/03/14 at 21:20 by Rise Mu Performed at [facility]   Final    ABUNDANT ENTEROCOCCUS SPECIES Performed at [facility]    Report Status 05/05/2014 FINAL  Final   Organism ID, Bacteria ENTEROCOCCUS SPECIES  Final      Susceptibility   Enterococcus species - MIC*    VANCOMYCIN 2 SENSITIVE Sensitive     AMPICILLIN <=2 SENSITIVE Sensitive     * ABUNDANT ENTEROCOCCUS SPECIES  Culture, blood (routine x 2)     Status: None (Preliminary result)   Collection Time: 05/03/14  7:26 PM  Result Value Ref Range Status   Specimen Description BLOOD LEFT ARM  Final   Special Requests BOTTLES DRAWN AEROBIC AND ANAEROBIC 5CC EACH  Final   Culture  Setup Time   Final    05/04/2014 03:55 Performed at [facility]    Culture   Final           BLOOD CULTURE RECEIVED NO GROWTH TO DATE CULTURE WILL BE HELD FOR 5 DAYS BEFORE ISSUING A FINAL NEGATIVE REPORT Performed at [facility]    Report Status PENDING  Incomplete  Culture, blood (routine x 2)     Status: None (Preliminary result)   Collection Time: 05/03/14  7:50 PM  Result Value Ref Range Status   Specimen Description BLOOD RIGHT ANTECUBITAL  Final   Special Requests BOTTLES DRAWN AEROBIC AND ANAEROBIC 10CC EACH  Final   Culture  Setup Time   Final    05/04/2014 03:55 Performed at [facility]    Culture   Final           BLOOD CULTURE RECEIVED NO GROWTH TO DATE CULTURE WILL BE HELD FOR 5 DAYS BEFORE ISSUING A FINAL NEGATIVE REPORT Performed at [facility]    Report Status PENDING  Incomplete  Urine culture     Status: None   Collection Time: 05/03/14  8:29 PM  Result Value Ref Range Status   Specimen Description URINE, RANDOM  Final   Special Requests ADDED 1940 05/04/14  Final   Culture  Setup Time   Final    05/04/2014 20:32 Performed at [place] Performed at [facility]   Final   Culture NO GROWTH Performed at [facility]   Final   Report Status 05/05/2014 FINAL  Final  MRSA PCR Screening     Status: None   Collection Time: 05/04/14 10:39 AM  Result Value Ref Range Status   MRSA by PCR NEGATIVE NEGATIVE Final    Comment:        The GeneXpert MRSA Assay (  FDA approved for NASAL specimens only), is one component of a comprehensive MRSA colonization surveillance program. It is not intended to diagnose MRSA infection nor to guide or monitor treatment for MRSA infections.      Studies: No results found.  Scheduled Meds: . amoxicillin  250 mg Oral Q12H  . cyanocobalamin  1,000 mcg Intramuscular Q30 days  . folic acid  1 mg Oral Daily  . hydrocortisone cream  1 application Topical BID  . pantoprazole  40 mg Oral Daily  . sodium chloride  3 mL Intravenous Q12H   Continuous Infusions:      Time spent: 25 minutes    [facility] Pager [phone]. If 7PM-7AM, please contact night-coverage at [email or web address], password [email or web address] 05/07/2014, 9:26 AM  LOS: 9 days

## 2014-05-07 NOTE — Progress Notes (Signed)
Physical Therapy Treatment Patient Details Name: [name] MRN: [id number] DOB: June 07, 1950 Today's Date: 05/07/2014    History of Present Illness 63 year-old male with history of metastatic colon cancer, anemia, coronary artery disease, history of hydronephrosis status post stenting in April 2015, nephrolithiasis, chronic kidney disease stage III who was sent to the ED from his hematology office with Gross Hematuria with Anemia, Thrombocytopenia; Patient now s/p left neph tube for drainage 12/5. Long h/o nephrolithiasis.    PT Comments    Patient able to tolerated ambulation today, some modest instability noted but overall mobilizing well. Will continue to progress as tolerated.  Follow Up Recommendations  No PT follow up;Supervision/Assistance - 24 hour     Equipment Recommendations  None recommended by PT    Recommendations for Other Services       Precautions / Restrictions Precautions Precautions: Fall Restrictions Weight Bearing Restrictions: No    Mobility  Bed Mobility Overal bed mobility: Modified Independent             General bed mobility comments: increased time to perform  Transfers Overall transfer level: Needs assistance Equipment used:  (pushing IV pole) Transfers: Sit to/from Stand Sit to Stand: Min assist         General transfer comment: Min assist for initial stability, then min guard  Ambulation/Gait Ambulation/Gait assistance: Min guard Ambulation Distance (Feet): 240 Feet Assistive device:  (pushing IV pole) Gait Pattern/deviations: Decreased stride length;Narrow base of support Gait velocity: decreased Gait velocity interpretation: Below normal speed for age/gender General Gait Details: VCs for upright posture and relaxation of shoulder during ambulation, some modest instability and decreased cadence noted.   Stairs            Wheelchair Mobility    Modified Rankin (Stroke Patients Only)       Balance                                     Cognition Arousal/Alertness: Awake/alert Behavior During Therapy: WFL for tasks assessed/performed Overall Cognitive Status: Within Functional Limits for tasks assessed                      Exercises      General Comments        Pertinent Vitals/Pain Pain Assessment: No/denies pain    Home Living                      Prior Function            PT Goals (current goals can now be found in the care plan section) Acute Rehab PT Goals Patient Stated Goal: to go home PT Goal Formulation: With patient/family Time For Goal Achievement: 05/13/14 Potential to Achieve Goals: Good Progress towards PT goals: Progressing toward goals    Frequency  Min 3X/week    PT Plan Current plan remains appropriate    Co-evaluation             End of Session Equipment Utilized During Treatment: Gait belt Activity Tolerance: Patient tolerated treatment well Patient left: in chair;with call bell/phone within reach;with family/visitor present     Time: 7494-4967 PT Time Calculation (min) (ACUTE ONLY): 22 min  Charges:  $Gait Training: 8-22 mins                    G Codes:      Dennies Coate,  Havard Radigan J 05/07/2014, 3:31 PM Alben Deeds, [place] DPT  [phone]

## 2014-05-07 NOTE — Progress Notes (Signed)
Patient ID: [name], male   DOB: 06/21/1950, 63 y.o.   MRN: [id number]    Subjective: [name] has no major complaints now but had some bladder pain with clot obstruction last night that required irrigation.   The nephrostomy drainage is clearing but he still has bloody drainage from the foley.  ROS:  Review of Systems  Constitutional: Negative for fever.  Gastrointestinal: Negative for nausea.    Anti-infectives: Anti-infectives    Start     Dose/Rate Route Frequency Ordered Stop   05/05/14 1300  amoxicillin (AMOXIL) capsule 250 mg     250 mg Oral Every 12 hours 05/05/14 1227     05/05/14 1215  amoxicillin (AMOXIL) chewable tablet 250 mg  Status:  Discontinued     250 mg Oral Every 12 hours 05/05/14 1214 05/05/14 1227   05/03/14 2200  vancomycin (VANCOCIN) IVPB 1000 mg/200 mL premix  Status:  Discontinued     1,000 mg200 mL/hr over 60 Minutes Intravenous Every 24 hours 05/03/14 2151 05/05/14 1214   05/03/14 1200  ceFAZolin (ANCEF) IVPB 2 g/50 mL premix     2 g100 mL/hr over 30 Minutes Intravenous On call 05/02/14 1254 05/03/14 1011   05/03/14 0914  ceFAZolin (ANCEF) 2-3 GM-% IVPB SOLR    Comments:  Shaaron Adler   : cabinet override      05/03/14 0914 05/03/14 2114      Current Facility-Administered Medications  Medication Dose Route Frequency Provider Last Rate Last Dose  . acetaminophen (TYLENOL) tablet 650 mg  650 mg Oral Q6H PRN Nishant Dhungel, MD   650 mg at 05/03/14 1810  . amoxicillin (AMOXIL) capsule 250 mg  250 mg Oral Q12H Nishant Dhungel, MD   250 mg at 05/06/14 2207  . cyanocobalamin ((VITAMIN B-12)) injection 1,000 mcg  1,000 mcg Intramuscular Q30 days Shanda Howells, MD   1,000 mcg at 04/29/14 0947  . folic acid (FOLVITE) tablet 1 mg  1 mg Oral Daily Shanda Howells, MD   1 mg at 05/06/14 0912  . HYDROcodone-acetaminophen (NORCO/VICODIN) 5-325 MG per tablet 1-2 tablet  1-2 tablet Oral Q6H PRN Shanda Howells, MD   2 tablet at 05/06/14 1500  . hydrocortisone cream 1  % 1 application  1 application Topical BID Shanda Howells, MD   1 application at 10/93/23 2208  . hydrocortisone cream 1 %   Topical TID PRN Carmin Muskrat, MD      . HYDROmorphone (DILAUDID) injection 2 mg  2 mg Intravenous Q4H PRN Nishant Dhungel, MD   2 mg at 05/06/14 2309  . nystatin (MYCOSTATIN/NYSTOP) topical powder   Topical BID PRN Nishant Dhungel, MD      . ondansetron (ZOFRAN) tablet 8 mg  8 mg Oral BID PRN Shanda Howells, MD      . pantoprazole (PROTONIX) EC tablet 40 mg  40 mg Oral Daily Shanda Howells, MD   40 mg at 05/06/14 0912  . sodium chloride 0.9 % injection 10-40 mL  10-40 mL Intracatheter PRN Nishant Dhungel, MD   10 mL at 05/04/14 2200  . sodium chloride 0.9 % injection 3 mL  3 mL Intravenous Q12H Shanda Howells, MD   3 mL at 05/06/14 2207   Facility-Administered Medications Ordered in Other Encounters  Medication Dose Route Frequency Provider Last Rate Last Dose  . 0.9 %  sodium chloride infusion   Intravenous Continuous Pieter Partridge, MD 20 mL/hr at 07/17/12 1113    . heparin lock flush 100 unit/mL  500 Units Intravenous Once  Pieter Partridge, MD      . sodium chloride 0.9 % injection 10 mL  10 mL Intravenous PRN Pieter Partridge, MD   10 mL at 07/17/12 1113     Objective: Vital signs in last 24 hours: Temp:  [97.2 F (36.2 C)-98 F (36.7 C)] 97.7 F (36.5 C) (12/09 0406) Pulse Rate:  [60-76] 66 (12/09 0406) Resp:  [10-16] 10 (12/09 0406) BP: (102-119)/(47-58) 113/49 mmHg (12/09 0406) SpO2:  [100 %] 100 % (12/09 0406) Weight:  [101.6 kg (223 lb 15.8 oz)] 101.6 kg (223 lb 15.8 oz) (12/09 0019)  Intake/Output from previous day: 12/08 0701 - 12/09 0700 In: 35329 [P.O.:760] Out: 92426 [Urine:22025; Stool:520] Intake/Output this shift:     Physical Exam  Constitutional: He is well-developed, well-nourished, and in no distress.  Abdominal: Soft. There is no tenderness.    Lab Results:   Recent Labs  05/05/14 0525 05/06/14 0400  WBC 4.5 5.7  HGB 6.8*  7.4*  HCT 20.3* 22.3*  PLT 44* 64*   BMET  Recent Labs  05/05/14 0525 05/06/14 0400  NA 137 142  K 4.2 3.4*  CL 105 109  CO2 21 21  GLUCOSE 131* 100*  BUN 34* 33*  CREATININE 2.50* 2.41*  CALCIUM 8.0* 7.9*   PT/INR No results for input(s): LABPROT, INR in the last 72 hours. ABG No results for input(s): PHART, HCO3 in the last 72 hours.  Invalid input(s): PCO2, PO2  Studies/Results: No results found.   Assessment: The nephrostomy drainage appears to be clearing but he still has some bloody urine per foley.   Plan: Continue current care.     LOS: 9 days    Josepha Barbier J 05/07/2014

## 2014-05-07 NOTE — Progress Notes (Signed)
Referring Physician(s): Wrenn  Subjective:  L PCN placed 12/5 Hydro- renal calculi Output great; clearing Renal fxn improving  Allergies: Daypro and Xeloda  Medications: Prior to Admission medications   Medication Sig Start Date End Date Taking? Authorizing Provider  amoxicillin-clavulanate (AUGMENTIN) 875-125 MG per tablet Take 1 tablet by mouth every 12 (twelve) hours. 04/18/14  Yes Historical Provider, MD  clindamycin (CLEOCIN-T) 1 % lotion Apply topically 2 (two) times daily. 04/07/14  Yes Manon Hilding Kefalas, PA-C  cyanocobalamin (,VITAMIN B-12,) 1000 MCG/ML injection Inject 1,000 mcg into the muscle every 30 (thirty) days.    Yes Historical Provider, MD  folic acid (FOLVITE) 1 MG tablet Take 1 tablet (1 mg total) by mouth daily. 04/28/14  Yes Baird Cancer, PA-C  HYDROcodone-acetaminophen (NORCO/VICODIN) 5-325 MG per tablet Take 1-2 tablets by mouth every 6 (six) hours as needed. 04/14/14  Yes Manon Hilding Kefalas, PA-C  hydrocortisone cream 1 % Apply 1 application topically 2 (two) times daily. Mix with urea cream and apply to affected areas. 04/23/14  Yes Manon Hilding Kefalas, PA-C  hydrocortisone-urea (CARMOL-HC) 1-10 % cream Apply topically 3 (three) times daily. 04/22/14  Yes Farrel Gobble, MD  lidocaine-prilocaine (EMLA) cream Apply a quarter size amount to port site 1 hour prior to chemo. Do not rub in. Cover with plastic wrap. 03/13/14  Yes Baird Cancer, PA-C  omeprazole (PRILOSEC) 20 MG capsule Take 1 capsule (20 mg total) by mouth daily. 09/28/13  Yes Rexene Alberts, MD  ondansetron (ZOFRAN) 8 MG tablet Take 1 tablet (8 mg total) by mouth 2 (two) times daily as needed for nausea or vomiting. 03/13/14  Yes Manon Hilding Kefalas, PA-C  urea (CARMOL) 10 % cream Apply topically as needed. Mix with hydrocortisone cream and apply to affected areas. 04/23/14  Yes Baird Cancer, PA-C    Review of Systems  Vital Signs: BP 111/50 mmHg  Pulse 66  Temp(Src) 98.2 F (36.8 C) (Oral)   Resp 10  Ht 5\' 10"  (1.778 m)  Wt 101.6 kg (223 lb 15.8 oz)  BMI 32.14 kg/m2  SpO2 100%  Physical Exam  Abdominal:  L PCN site clean and dry NT Output only blood tinged---clearing 425 cc yesterday 100 cc in bag Bun/Cr better daily afeb    Imaging: No results found.  Labs:  CBC:  Recent Labs  05/03/14 0500 05/04/14 0608 05/05/14 0525 05/06/14 0400  WBC 5.0 6.4 4.5 5.7  HGB 7.0* 5.5* 6.8* 7.4*  HCT 20.9* 16.9* 20.3* 22.3*  PLT 30* 31* 44* 64*    COAGS:  Recent Labs  09/10/13 1250 09/20/13 1305 05/03/14 0500  INR 1.16 1.15 1.33  APTT 38*  --   --     BMP:  Recent Labs  05/03/14 0500 05/04/14 0608 05/05/14 0525 05/06/14 0400  NA 138 137 137 142  K 3.7 3.5* 4.2 3.4*  CL 106 106 105 109  CO2 20 20 21 21   GLUCOSE 80 153* 131* 100*  BUN 40* 36* 34* 33*  CALCIUM 7.7* 7.5* 8.0* 7.9*  CREATININE 3.88* 3.22* 2.50* 2.41*  GFRNONAA 15* 19* 26* 27*  GFRAA 18* 22* 30* 32*    LIVER FUNCTION TESTS:  Recent Labs  04/14/14 0900 04/21/14 0842 04/28/14 0851 04/30/14 0553 05/01/14 0551  BILITOT 1.0 0.7 0.6 2.2*  --   AST 33 27 39* 36  --   ALT 24 24 34 34  --   ALKPHOS 115 91 76 59  --   PROT 6.3 6.1 6.0 4.7*  --  ALBUMIN 2.8* 2.2* 2.0* 1.7* 1.8*    Assessment and Plan:  L PCN intact Output from PCN less bloody Doing well Bun/Cr improving Will follow   I spent a total of 15 minutes face to face in clinical consultation/evaluation, greater than 50% of which was counseling/coordinating care for L PCN  Signed: Leighanna Kirn A 05/07/2014, 12:10 PM

## 2014-05-08 ENCOUNTER — Inpatient Hospital Stay (HOSPITAL_COMMUNITY): Payer: Managed Care, Other (non HMO) | Admitting: Anesthesiology

## 2014-05-08 ENCOUNTER — Other Ambulatory Visit: Payer: Self-pay | Admitting: Urology

## 2014-05-08 ENCOUNTER — Encounter (HOSPITAL_COMMUNITY)
Admission: EM | Disposition: A | Discharge status: Home / Self Care | Payer: Self-pay | Source: Home / Self Care | Attending: Internal Medicine

## 2014-05-08 ENCOUNTER — Inpatient Hospital Stay (HOSPITAL_COMMUNITY): Payer: Managed Care, Other (non HMO)

## 2014-05-08 DIAGNOSIS — N200 Calculus of kidney: Secondary | ICD-10-CM

## 2014-05-08 HISTORY — PX: CYSTOSCOPY WITH RETROGRADE PYELOGRAM, URETEROSCOPY AND STENT PLACEMENT: SHX5789

## 2014-05-08 LAB — CBC
HCT: 22.3 % — ABNORMAL LOW (ref 39.0–52.0)
HEMATOCRIT: 25.9 % — AB (ref 39.0–52.0)
HEMOGLOBIN: 8.6 g/dL — AB (ref 13.0–17.0)
Hemoglobin: 7.4 g/dL — ABNORMAL LOW (ref 13.0–17.0)
MCH: 30.7 pg (ref 26.0–34.0)
MCH: 31 pg (ref 26.0–34.0)
MCHC: 33.2 g/dL (ref 30.0–36.0)
MCHC: 33.2 g/dL (ref 30.0–36.0)
MCV: 92.5 fL (ref 78.0–100.0)
MCV: 93.3 fL (ref 78.0–100.0)
Platelets: 73 10*3/uL — ABNORMAL LOW (ref 150–400)
Platelets: 53 10*3/uL — ABNORMAL LOW (ref 150–400)
RBC: 2.8 MIL/uL — AB (ref 4.22–5.81)
RBC: 2.39 MIL/uL — AB (ref 4.22–5.81)
RDW: 18.4 % — ABNORMAL HIGH (ref 11.5–15.5)
RDW: 18.7 % — ABNORMAL HIGH (ref 11.5–15.5)
WBC: 6 10*3/uL (ref 4.0–10.5)
WBC: 4.4 10*3/uL (ref 4.0–10.5)

## 2014-05-08 LAB — BASIC METABOLIC PANEL
Anion gap: 7 (ref 5–15)
Anion gap: 9 (ref 5–15)
BUN: 25 mg/dL — AB (ref 6–23)
BUN: 23 mg/dL (ref 6–23)
CALCIUM: 8.1 mg/dL — AB (ref 8.4–10.5)
CHLORIDE: 109 meq/L (ref 96–112)
CO2: 22 meq/L (ref 19–32)
CO2: 21 meq/L (ref 19–32)
Calcium: 8.2 mg/dL — ABNORMAL LOW (ref 8.4–10.5)
Chloride: 109 mEq/L (ref 96–112)
Creatinine, Ser: 1.95 mg/dL — ABNORMAL HIGH (ref 0.50–1.35)
Creatinine, Ser: 1.87 mg/dL — ABNORMAL HIGH (ref 0.50–1.35)
GFR calc Af Amer: 41 mL/min — ABNORMAL LOW (ref >90)
GFR calc Af Amer: 43 mL/min — ABNORMAL LOW (ref >90)
GFR calc non Af Amer: 35 mL/min — ABNORMAL LOW (ref >90)
GFR calc non Af Amer: 37 mL/min — ABNORMAL LOW (ref >90)
GLUCOSE: 111 mg/dL — AB (ref 70–99)
Glucose, Bld: 77 mg/dL (ref 70–99)
POTASSIUM: 3.9 meq/L (ref 3.7–5.3)
Potassium: 4 mEq/L (ref 3.7–5.3)
SODIUM: 138 meq/L (ref 137–147)
SODIUM: 139 meq/L (ref 137–147)

## 2014-05-08 LAB — TYPE AND SCREEN
ABO/RH(D): A POS
Antibody Screen: NEGATIVE
Unit division: 0

## 2014-05-08 SURGERY — CYSTOURETEROSCOPY, WITH RETROGRADE PYELOGRAM AND STENT INSERTION
Anesthesia: General | Site: Ureter | Laterality: Left

## 2014-05-08 MED ORDER — ZOLPIDEM TARTRATE 5 MG PO TABS
5.0000 mg | ORAL_TABLET | Freq: Once | ORAL | Status: AC
  Administered 2014-05-08: 5 mg via ORAL
  Filled 2014-05-08: qty 1

## 2014-05-08 MED ORDER — OXYCODONE HCL 5 MG/5ML PO SOLN: 5.0000 mg | Freq: Once | ORAL | Status: AC | PRN

## 2014-05-08 MED ORDER — LACTATED RINGERS IV SOLN
INTRAVENOUS | Status: DC
  Administered 2014-05-08 – 2014-05-09 (×2): via INTRAVENOUS

## 2014-05-08 MED ORDER — FENTANYL CITRATE 0.05 MG/ML IJ SOLN
INTRAMUSCULAR | Status: DC | PRN
  Administered 2014-05-08 (×6): 25 ug via INTRAVENOUS
  Administered 2014-05-08: 50 ug via INTRAVENOUS
  Administered 2014-05-08 (×2): 25 ug via INTRAVENOUS

## 2014-05-08 MED ORDER — ONDANSETRON HCL 4 MG/2ML IJ SOLN
INTRAMUSCULAR | Status: DC | PRN
  Administered 2014-05-08: 4 mg via INTRAVENOUS

## 2014-05-08 MED ORDER — FENTANYL CITRATE 0.05 MG/ML IJ SOLN
INTRAMUSCULAR | Status: AC
  Filled 2014-05-08: qty 5

## 2014-05-08 MED ORDER — SODIUM CHLORIDE 0.9 % IR SOLN
Status: DC | PRN
  Administered 2014-05-08: 1000 mL

## 2014-05-08 MED ORDER — DEXAMETHASONE SODIUM PHOSPHATE 10 MG/ML IJ SOLN
INTRAMUSCULAR | Status: DC | PRN
  Administered 2014-05-08: 10 mg via INTRAVENOUS

## 2014-05-08 MED ORDER — LACTATED RINGERS IV SOLN
INTRAVENOUS | Status: DC | PRN
  Administered 2014-05-08: 15:00:00 via INTRAVENOUS

## 2014-05-08 MED ORDER — PROMETHAZINE HCL 25 MG/ML IJ SOLN: 6.2500 mg | INTRAMUSCULAR | Status: DC | PRN

## 2014-05-08 MED ORDER — HYDROMORPHONE HCL 1 MG/ML IJ SOLN: 0.2500 mg | INTRAMUSCULAR | Status: DC | PRN

## 2014-05-08 MED ORDER — ONDANSETRON HCL 4 MG/2ML IJ SOLN
INTRAMUSCULAR | Status: AC
  Filled 2014-05-08: qty 2

## 2014-05-08 MED ORDER — PROPOFOL 10 MG/ML IV BOLUS
INTRAVENOUS | Status: AC
  Filled 2014-05-08: qty 20

## 2014-05-08 MED ORDER — PROPOFOL 10 MG/ML IV BOLUS
INTRAVENOUS | Status: DC | PRN
  Administered 2014-05-08: 180 mg via INTRAVENOUS

## 2014-05-08 MED ORDER — MEPERIDINE HCL 25 MG/ML IJ SOLN: 6.2500 mg | INTRAMUSCULAR | Status: DC | PRN

## 2014-05-08 MED ORDER — OXYCODONE HCL 5 MG PO TABS: 5.0000 mg | ORAL_TABLET | Freq: Once | ORAL | Status: AC | PRN

## 2014-05-08 MED ORDER — DEXAMETHASONE SODIUM PHOSPHATE 10 MG/ML IJ SOLN
INTRAMUSCULAR | Status: AC
  Filled 2014-05-08: qty 1

## 2014-05-08 MED ORDER — LIDOCAINE HCL 2 % EX GEL
CUTANEOUS | Status: AC
  Filled 2014-05-08: qty 10

## 2014-05-08 MED ORDER — 0.9 % SODIUM CHLORIDE (POUR BTL) OPTIME
TOPICAL | Status: DC | PRN
  Administered 2014-05-08: 1000 mL

## 2014-05-08 MED ORDER — SODIUM CHLORIDE 0.9 % IR SOLN
Status: DC | PRN
  Administered 2014-05-08: 3000 mL

## 2014-05-08 MED ORDER — SODIUM CHLORIDE 0.9 % IJ SOLN
10.0000 mL | INTRAMUSCULAR | Status: DC | PRN
  Administered 2014-05-09 – 2014-05-10 (×4): 10 mL
  Administered 2014-05-11 (×2): 20 mL
  Administered 2014-05-12 – 2014-05-18 (×6): 10 mL
  Filled 2014-05-08 (×12): qty 40

## 2014-05-08 MED ORDER — IOHEXOL 300 MG/ML  SOLN
INTRAMUSCULAR | Status: DC | PRN
  Administered 2014-05-08: 10 mL

## 2014-05-08 MED ORDER — SODIUM CHLORIDE 0.9 % IV SOLN
3.0000 g | INTRAVENOUS | Status: AC
  Administered 2014-05-08: 3 g via INTRAVENOUS
  Filled 2014-05-08: qty 3

## 2014-05-08 SURGICAL SUPPLY — 20 items
BAG URO CATCHER STRL LF (DRAPE) ×3 IMPLANT
CATH FOLEY 3WAY 30CC 24FR (CATHETERS) ×2
CATH URET 5FR 28IN OPEN ENDED (CATHETERS) ×3 IMPLANT
CATH URO 16X24FR 3W FL PS (CATHETERS) ×1 IMPLANT
CLOTH BEACON ORANGE TIMEOUT ST (SAFETY) ×3 IMPLANT
DRAPE CAMERA CLOSED 9X96 (DRAPES) ×3 IMPLANT
GLOVE SURG SS PI 8.0 STRL IVOR (GLOVE) ×3 IMPLANT
GOWN STRL REUS W/TWL XL LVL3 (GOWN DISPOSABLE) ×3 IMPLANT
GUIDEWIRE STR DUAL SENSOR (WIRE) ×6 IMPLANT
IV NS 1000ML (IV SOLUTION) ×2
IV NS 1000ML BAXH (IV SOLUTION) ×1 IMPLANT
IV NS IRRIG 3000ML ARTHROMATIC (IV SOLUTION) ×6 IMPLANT
MANIFOLD NEPTUNE II (INSTRUMENTS) ×3 IMPLANT
NS IRRIG 1000ML POUR BTL (IV SOLUTION) ×3 IMPLANT
PACK CYSTO (CUSTOM PROCEDURE TRAY) ×3 IMPLANT
SHEATH ACCESS URETERAL 38CM (SHEATH) ×3 IMPLANT
SYR 30ML LL (SYRINGE) ×3 IMPLANT
SYRINGE IRR TOOMEY STRL 70CC (SYRINGE) ×3 IMPLANT
TUBING CONNECTING 10 (TUBING) ×2 IMPLANT
TUBING CONNECTING 10' (TUBING) ×1

## 2014-05-08 NOTE — Anesthesia Preprocedure Evaluation (Addendum)
Anesthesia Evaluation  Patient identified by MRN, date of birth, ID band Patient awake    Reviewed: Allergy & Precautions, H&P , NPO status , Patient's Chart, lab work & pertinent test results, reviewed documented beta blocker date and time   Airway Mallampati: II  TM Distance: >3 FB     Dental  (+) Poor Dentition, Missing, Loose   Pulmonary shortness of breath (mets to lung), sleep apnea , former smoker,  breath sounds clear to auscultation        Cardiovascular hypertension, Pt. on medications and Pt. on home beta blockers + CAD, + Past MI and + Cardiac Stents Rhythm:Regular Rate:Normal     Neuro/Psych negative neurological ROS  negative psych ROS   GI/Hepatic negative GI ROS, Neg liver ROS,   Endo/Other  diabetes, Type 2  Renal/GU ARFRenal disease     Musculoskeletal negative musculoskeletal ROS (+)   Abdominal (+) + obese,   Peds  Hematology negative hematology ROS (+) anemia ,   Anesthesia Other Findings   Reproductive/Obstetrics                            Anesthesia Physical  Anesthesia Plan  ASA: IV  Anesthesia Plan: General   Post-op Pain Management:    Induction: Intravenous  Airway Management Planned: LMA  Additional Equipment:   Intra-op Plan:   Post-operative Plan: Extubation in OR  Informed Consent: I have reviewed the patients History and Physical, chart, labs and discussed the procedure including the risks, benefits and alternatives for the proposed anesthesia with the patient or authorized representative who has indicated his/her understanding and acceptance.   Dental advisory given  Plan Discussed with:   Anesthesia Plan Comments: (1/2 amp D50 for CBG=75 plts infusing now)        Anesthesia Quick Evaluation

## 2014-05-08 NOTE — Progress Notes (Signed)
Patient ID: [name], male   DOB: June 09, 1950, 63 y.o.   MRN: [id number]    Subjective: I was called by nursing this am about [name].  He had increased hematuria with recurrent clot obstruction and his CBI was having to be run full speed.   They were concerned about running out of bags.  He has some bladder pressure but no other complaints.   His NT drainage is clear.  His Cr is down to 1.95 and his Plt count is up to 73.  I have posted him to go to the OR this evening for ureteroscopy to look for the source of bleeding.  ROS:  Review of Systems  Constitutional: Negative for fever.  Gastrointestinal: Negative for nausea.    Anti-infectives: Anti-infectives    Start     Dose/Rate Route Frequency Ordered Stop   05/05/14 1300  amoxicillin (AMOXIL) capsule 250 mg     250 mg Oral Every 12 hours 05/05/14 1227     05/05/14 1215  amoxicillin (AMOXIL) chewable tablet 250 mg  Status:  Discontinued     250 mg Oral Every 12 hours 05/05/14 1214 05/05/14 1227   05/03/14 2200  vancomycin (VANCOCIN) IVPB 1000 mg/200 mL premix  Status:  Discontinued     1,000 mg200 mL/hr over 60 Minutes Intravenous Every 24 hours 05/03/14 2151 05/05/14 1214   05/03/14 1200  ceFAZolin (ANCEF) IVPB 2 g/50 mL premix     2 g100 mL/hr over 30 Minutes Intravenous On call 05/02/14 1254 05/03/14 1011   05/03/14 0914  ceFAZolin (ANCEF) 2-3 GM-% IVPB SOLR    Comments:  Shaaron Adler   : cabinet override      05/03/14 0914 05/03/14 2114      Current Facility-Administered Medications  Medication Dose Route Frequency Provider Last Rate Last Dose  . acetaminophen (TYLENOL) tablet 650 mg  650 mg Oral Q6H PRN Nishant Dhungel, MD   650 mg at 05/03/14 1810  . amoxicillin (AMOXIL) capsule 250 mg  250 mg Oral Q12H Nishant Dhungel, MD   250 mg at 05/08/14 0006  . cyanocobalamin ((VITAMIN B-12)) injection 1,000 mcg  1,000 mcg Intramuscular Q30 days Shanda Howells, MD   1,000 mcg at 04/29/14 0947  . folic acid (FOLVITE) tablet 1 mg   1 mg Oral Daily Shanda Howells, MD   1 mg at 05/07/14 0955  . HYDROcodone-acetaminophen (NORCO/VICODIN) 5-325 MG per tablet 1-2 tablet  1-2 tablet Oral Q6H PRN Shanda Howells, MD   2 tablet at 05/06/14 1500  . hydrocortisone cream 1 % 1 application  1 application Topical BID Shanda Howells, MD   1 application at 35/36/14 0008  . hydrocortisone cream 1 %   Topical TID PRN Carmin Muskrat, MD      . HYDROmorphone (DILAUDID) injection 2 mg  2 mg Intravenous Q4H PRN Nishant Dhungel, MD   2 mg at 05/08/14 0621  . nystatin (MYCOSTATIN/NYSTOP) topical powder   Topical BID PRN Nishant Dhungel, MD      . ondansetron (ZOFRAN) tablet 8 mg  8 mg Oral BID PRN Shanda Howells, MD      . pantoprazole (PROTONIX) EC tablet 40 mg  40 mg Oral Daily Shanda Howells, MD   40 mg at 05/07/14 0954  . sodium chloride 0.9 % injection 10-40 mL  10-40 mL Intracatheter PRN Nishant Dhungel, MD   20 mL at 05/08/14 0550  . sodium chloride 0.9 % injection 3 mL  3 mL Intravenous Q12H Shanda Howells, MD   3  mL at 05/08/14 0007   Facility-Administered Medications Ordered in Other Encounters  Medication Dose Route Frequency Provider Last Rate Last Dose  . 0.9 %  sodium chloride infusion   Intravenous Continuous Pieter Partridge, MD 20 mL/hr at 07/17/12 1113    . heparin lock flush 100 unit/mL  500 Units Intravenous Once Pieter Partridge, MD      . sodium chloride 0.9 % injection 10 mL  10 mL Intravenous PRN Pieter Partridge, MD   10 mL at 07/17/12 1113     Objective: Vital signs in last 24 hours: Temp:  [97.9 F (36.6 C)-99 F (37.2 C)] 98.7 F (37.1 C) (12/10 1000) Pulse Rate:  [71-80] 76 (12/10 1000) Resp:  [14-19] 16 (12/10 1000) BP: (106-144)/(49-71) 144/49 mmHg (12/10 1000) SpO2:  [99 %-100 %] 99 % (12/10 1000) Weight:  [101.5 kg (223 lb 12.3 oz)] 101.5 kg (223 lb 12.3 oz) (12/09 1752)  Intake/Output from previous day: 12/09 0701 - 12/10 0700 In: 37106 [P.O.:1400; I.V.:40; Blood:255] Out: 26948 [Urine:24950;  Stool:720] Intake/Output this shift: Total I/O In: 9000 [Other:9000] Out: 6100 [Urine:6100]   Physical Exam  Constitutional: He is well-developed, well-nourished, and in no distress.  Genitourinary:  The foley is draining pink urine on max CBI.     Lab Results:   Recent Labs  05/07/14 1530 05/08/14 0550  WBC 3.4* 6.0  HGB 6.7* 8.6*  HCT 20.2* 25.9*  PLT 56* 73*   BMET  Recent Labs  05/07/14 1530 05/08/14 0550  NA 137 138  K 3.4* 3.9  CL 107 109  CO2 20 22  GLUCOSE 135* 77  BUN 27* 25*  CREATININE 2.05* 1.95*  CALCIUM 7.8* 8.2*   PT/INR No results for input(s): LABPROT, INR in the last 72 hours. ABG No results for input(s): PHART, HCO3 in the last 72 hours.  Invalid input(s): PCO2, PO2  Studies/Results: No results found.  I have reviewed his recent labs.   Procedure:  I hand irrigated his foley with the removal of a large amount of clots.  I lowered the foley balloon during the irrigation and was able to get him clot free, but the urine never fully cleared suggesting continued active bleeding.   He was placed back on CBI.  Assessment: He had recurrent clot retention and appears to have persistent active bleeding that is felt to originate from the ureter, probably below his proximal stone since the nephrostomy drainage is clear.   Plan: I will have him transferred to [facility] to take to the OR this evening for cystoscopy with ureteroscopy to better assess the source of bleeding.  I will probably try to remove the ureteral stone and fulgurate the bleeding if need be.  The left renal stone is larger than I would generally try to deal with with a retrograde approach, but I will take a look at it if possible.  His platelet count is acceptable and his Hgb is above 8.   I reviewed the risks of bleeding, infection, ureteral injury, need for a stent or secondary procedures, thrombotic events and anesthetic complications.      LOS: 10 days    [place] Pandey  J 05/08/2014

## 2014-05-08 NOTE — Progress Notes (Signed)
Called and gave report to [facility], Therapist, sports at [facility]. Pt scheduled to leave unit via [facility] for transport to [facility]. Per Dr. Jeffie Pollock CBI will continue to run but at a slower place for transport purposes.

## 2014-05-08 NOTE — Progress Notes (Signed)
Patient was to go to IR from the PACU because of the persistent bleeding in the OR, but in the PACU the bleeding appears to have stopped and he has water clear urine with minimal irrigation.  I have cancelled IR, but will keep him NPO in case he starts bleeding again.   CBC and BMP ordered.   He has a current T&S.

## 2014-05-08 NOTE — Transfer of Care (Signed)
Immediate Anesthesia Transfer of Care Note  Patient: [name]  Procedure(s) Performed: Procedure(s) with comments: CYSTOSCOPY/ EVACUATION OF CLOTS  WITH LEFT RETROGRADE PYELOGRAM,LEFT URETEROSCOPY  (Left) - please add bugbee for bladder fulgeration  Patient Location: PACU  Anesthesia Type:General  Level of Consciousness: awake, alert , oriented and patient cooperative  Airway & Oxygen Therapy: Patient Spontanous Breathing and Patient connected to face mask oxygen  Post-op Assessment: Report given to PACU RN and Post -op Vital signs reviewed and stable  Post vital signs: Reviewed and stable  Complications: No apparent anesthesia complications

## 2014-05-08 NOTE — Brief Op Note (Signed)
04/28/2014 - 05/08/2014  6:02 PM  PATIENT:  [name]  63 y.o. male  PRE-OPERATIVE DIAGNOSIS:  left ureteral stone/hematuria   POST-OPERATIVE DIAGNOSIS:  Left ureteral hemorrhage.  PROCEDURE:  Procedure(s) with comments: CYSTOSCOPY/ EVACUATION OF CLOTS  WITH LEFT RETROGRADE PYELOGRAM,LEFT URETEROSCOPY  (Left) - please add bugbee for bladder fulgeration  SURGEON:  Surgeon(s) and Role:    * Malka So, MD - Primary  PHYSICIAN ASSISTANT:   ASSISTANTS: none   ANESTHESIA:   general  EBL:  Total I/O In: 22979 [I.V.:500; Other:12150] Out: 89211 [HERDE:08144; Stool:150]  BLOOD ADMINISTERED:none  DRAINS: Urinary Catheter (Foley)   LOCAL MEDICATIONS USED:  NONE  SPECIMEN:  No Specimen  DISPOSITION OF SPECIMEN:  N/A  COUNTS:  YES  TOURNIQUET:  * No tourniquets in log *  DICTATION: .Other Dictation: Dictation Number [id number]  PLAN OF CARE: Admit to inpatient   PATIENT DISPOSITION:  PACU - guarded condition.   Delay start of Pharmacological VTE agent (>24hrs) due to surgical blood loss or risk of bleeding: yes

## 2014-05-08 NOTE — Progress Notes (Signed)
Patient transferred from [facility] directly to OR and just transferred to the floor from PACU, patient oriented to room/unit and reviewed plan of care with patient and night shift nurse to follow up with plan of care.

## 2014-05-08 NOTE — Progress Notes (Signed)
Pt complaining of pressure on bladder. Continuous bladder irrigation not draining properly. Irrigated foley catheter and pulled out blood clots. Foley now draining blood and pink tinged urine. Per patient, pressure is relieved. Notified oncoming RN of episode. Will continue to monitor. Velora Mediate

## 2014-05-08 NOTE — Progress Notes (Signed)
[facility] PROGRESS NOTE  [name] GLO:[id number] DOB: [birth year] DOA: 04/28/2014 PCP: [name], MD  Brief narrative 63 year old male with history of metastatic colon cancer receiving chemotherapy in [place], anemia, coronary artery disease, history of hydronephrosis status post stenting in April 2015, nephrolithiasis, chronic kidney disease stage III who was sent to the ED from his hematology office. Patient reports intermittent hematuria for of about 2-3 weeks duration. Few days back he thought he passed his ureteral stent while using the bathroom. When he presented to his oncologist for chemotherapy he was found to have progressively worsened renal function. His last normal creatinine was 1.67 about one month back which worsened to 7.1 on 11/16. He was followed up again on 11/23 by his oncologist and his creatinine had worsened to 12.47. A renal ultrasound was done which showed moderate bilateral hydronephrosis with nephrocalcinosis. On 11/30 he was found to have bilateral lower extremity purpura and increased leg swellings. Blood work done showed platelets of 52,000 and creatinine of 18.4. He was also found to be in metabolic acidosis with CO2 9 with high anion gap.  A CT scan of the abdomen done in 11/30 showed increasing moderate to severe right hydronephrosis and severe left hydronephrosis. Also showed a 0.9x0.2 cm calculus at the left UPJ and unchanged multiple bilateral renal calculi  Urology was consulted and recommended [facility] but [facility].   Patient continues to have hematuria, despite the irrigation. Plan for transferring the patient to [place] for cystoscopy with ureteroscopy for evaluation of the bleeding.   Assessment/Plan: Acute on chronic kidney disease stage III secondary to obstructive uropathy with severe metabolic acidosis  -AG Metabolic acidosis resolved with improvement in renal  fn and bicarb drip . Renal fn improving after foley placed on 11/30. -bedside cystoscopy performed by Dr Jeffie Pollock on 12/3 showing a large amount of old and fresh clot that appears to have come from the left collecting system. There was a clot protruding from the ureteral orifice on the left which was left as is. S/p evacuation of the bladder clots with a 13fr Ainsworth foley. His prostate was short and minimally obstructing and the bladder wall had no evidence of cancer or stones. Foley was removed but had to be placed again on 12/4 due to urinary retention. -most likely source of bleeding are the left ureteral and renal stones,. IR placed rt sided PCN. Still having hematuria. reportedly has blood clots with difficulty flushing out. Continued bladder irrigation. Despite irrigation, he continued to have hematuria and required another unit of prbc transfusion on 12/9. He was transferred to [facility] long for cystoscopy with ureteroscopy to better assess the source of bleeding  -Urology scheduled for inpatient versus outpatient percutaneous nephrolithotomy on 12/15. Patient discharged prior to that will be admitted the previous night to hospitalist service and should received 2 units of platelets early morning to procedure..      Severe Anemia and thrombocytopenia Secondary to active hematuria and blood clots. Also contributed by chemotherapy and metastatic colon cancer. Patient has been receiving both PRBC and platelets. He has ongoing thrombocytopenia possibly related to chemotherapy and liver cirrhosis and improving minimally with transfusion..  Patient has received 9 u PRBC since admission . received 6 units PRBC during 2 weeks prior to admission. ( 15 units total in past 15 days) -Received IV dextran infusion on 12/7. Hemoglobin of 7.4 on 12/8 and repeat hemoglobin on 12/9 is 6.7 and he received a  unit of pbrc and repeat hemoglobin is 8.6.  Received 8 u platelets since admission with some  improvement today. Appreciate hematology evaluation on 12/6 .       Sepsis with enterococcus bacteriuria Patient febrile and hypotensive on the evening percutaneous nephrostomy was done (12/5). Urine culture from the percutaneous site growing enterococcus. Urine culture from the foley sent.  Blood culture with no growth to date. Spoke with ID consult Dr. Bridget Hartshorn and recommend switching to oral amoxicillin and to continue antibiotic until he has his for lithotripsy done to avoid further sepsis. Since lithotripsy is planned for next week we will treat him for a 2 weeks course. 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 Appreciate recommendations. Follows with oncology in [place].  Palpable purpura Noted HSP/ plantar erythrodysthesia. Reports to be due to chemotherapy Apply topical cream  Protein calorie malnutrition, severe Nutrition consult  B/l leg edema Prn lasix  Mild hypokalemia  Will be repleted as needed. Repeat bmp ordered showed resolution of hypokalemia    DVT prophylaxis: SCD  -Code Status: full code family Communication: none at bedside Disposition Plan:transfer to telemetry.   If bleeding resolves and his H&H and platelets remain stable, he can be discharged home in the next 48 hours and be readmitted to [facility] long hospital on 12/14 for percutaneous nephrolithotomy. It is possible patient may remain inpatient until then due to ongoing hematuria with anemia and thrombocytopenia.   Consultants:  Dr Jeffie Pollock  Renal ( signed off)  IR  hematology  Procedures:  Cystoscopy on 12/3  left percutaneous nephrostomy on 12/5  Antibiotics:  IV vanco 12/5--12/7  Oral  Amoxicillin 12/7-- until 12/21  Subjective Patient seen and examined.frustrated after hearing that he is being transferred to [facility]. But he feels better  .   Objective: Filed Vitals:   05/08/14 1000  BP: 144/49  Pulse: 76  Temp: 98.7 F (37.1 C)  Resp: 16    Intake/Output Summary (Last 24 hours) at 05/08/14 1523 Last data filed at 05/08/14 1432  Gross per 24 hour  Intake  15545 ml  Output  28270 ml  Net -12725 ml   Filed Weights   05/06/14 0500 05/07/14 0019 05/07/14 1752  Weight: 104.327 kg (230 lb) 101.6 kg (223 lb 15.8 oz) 101.5 kg (223 lb 12.3 oz)    Exam:   General: Lesion male in no acute distress  HEENT: Pallor present, moist mucosa  Chest: Clear breath sounds bilaterally, left Port-A-Cath  CVS: Normal S1-S2, no murmurs  Abdomen: Soft, nondistended, nontender, ileostomy status, left percutaneous nephrostomy draining bloody urine, Foley with blood mixed urine  Extremities: Palpable purpura over the dorsum of bilateral feet, trace edema bilaterally  CNS: Alert and oriented  Data Reviewed: Basic Metabolic Panel:  Recent Labs Lab 05/04/14 0608 05/05/14 0525 05/06/14 0400 05/07/14 1530 05/08/14 0550  NA 137 137 142 137 138  K 3.5* 4.2 3.4* 3.4* 3.9  CL 106 105 109 107 109  CO2 20 21 21 20 22   GLUCOSE 153* 131* 100* 135* 77  BUN 36* 34* 33* 27* 25*  CREATININE 3.22* 2.50* 2.41* 2.05* 1.95*  CALCIUM 7.5* 8.0* 7.9* 7.8* 8.2*   Liver Function Tests: No results for input(s): AST, ALT, ALKPHOS, BILITOT, PROT, ALBUMIN in the last 168 hours. No results for input(s): LIPASE, AMYLASE in the last 168 hours. No results for input(s): AMMONIA in the last 168 hours. CBC:  Recent Labs Lab 05/04/14 0608 05/05/14 0525 05/06/14 0400 05/07/14 1530 05/08/14 0550  WBC 6.4 4.5 5.7 3.4* 6.0  HGB 5.5* 6.8* 7.4* 6.7* 8.6*  HCT 16.9* 20.3* 22.3* 20.2* 25.9*  MCV 96.6 91.0 89.2 93.1 92.5  PLT 31* 44* 64* 56* 73*   Cardiac Enzymes: No results for input(s): CKTOTAL, CKMB, CKMBINDEX, TROPONINI in the last 168 hours. BNP (last 3 results) No results for input(s): PROBNP in the last 8760 hours. CBG:  Recent  Labs Lab 05/04/14 0754 05/04/14 1027  GLUCAP 118* 138*    Recent Results (from the past 240 hour(s))  Body fluid culture     Status: None   Collection Time: 05/03/14 10:03 AM  Result Value Ref Range Status   Specimen Description FLUID LEFT KIDNEY  Final   Special Requests NONE  Final   Gram Stain   Final    ABUNDANT WBC PRESENT, PREDOMINANTLY PMN MODERATE GRAM POSITIVE COCCI IN PAIRS AND CHAINS Gram Stain Report Called to,Read Back By and Verified With: Gram Stain Report Called to,Read Back By and Verified With: Stefani Dama RN on 05/03/14 at 21:20 by Rise Mu Performed at [facility]    Culture   Final    ABUNDANT ENTEROCOCCUS SPECIES Performed at [facility]    Report Status 05/05/2014 FINAL  Final   Organism ID, Bacteria ENTEROCOCCUS SPECIES  Final      Susceptibility   Enterococcus species - MIC*    VANCOMYCIN 2 SENSITIVE Sensitive     AMPICILLIN <=2 SENSITIVE Sensitive     * ABUNDANT ENTEROCOCCUS SPECIES  Culture, blood (routine x 2)     Status: None (Preliminary result)   Collection Time: 05/03/14  7:26 PM  Result Value Ref Range Status   Specimen Description BLOOD LEFT ARM  Final   Special Requests BOTTLES DRAWN AEROBIC AND ANAEROBIC 5CC EACH  Final   Culture  Setup Time   Final    05/04/2014 03:55 Performed at [facility]    Culture   Final           BLOOD CULTURE RECEIVED NO GROWTH TO DATE CULTURE WILL BE HELD FOR 5 DAYS BEFORE ISSUING A FINAL NEGATIVE REPORT Performed at [facility]    Report Status PENDING  Incomplete  Culture, blood (routine x 2)     Status: None (Preliminary result)   Collection Time: 05/03/14  7:50 PM  Result Value Ref Range Status   Specimen Description BLOOD RIGHT ANTECUBITAL  Final   Special Requests BOTTLES DRAWN AEROBIC AND ANAEROBIC 10CC EACH  Final   Culture  Setup Time   Final    05/04/2014 03:55 Performed at [facility]    Culture   Final           BLOOD CULTURE RECEIVED NO  GROWTH TO DATE CULTURE WILL BE HELD FOR 5 DAYS BEFORE ISSUING A FINAL NEGATIVE REPORT Performed at [facility]    Report Status PENDING  Incomplete  Urine culture     Status: None   Collection Time: 05/03/14  8:29 PM  Result Value Ref Range Status   Specimen Description URINE, RANDOM  Final   Special Requests ADDED 1940 05/04/14  Final   Culture  Setup Time   Final    05/04/2014 20:32 Performed at [place] Performed at [facility]   Final   Culture NO GROWTH Performed at [facility]   Final   Report  Status 05/05/2014 FINAL  Final  MRSA PCR Screening     Status: None   Collection Time: 05/04/14 10:39 AM  Result Value Ref Range Status   MRSA by PCR NEGATIVE NEGATIVE Final    Comment:        The GeneXpert MRSA Assay (FDA approved for NASAL specimens only), is one component of a comprehensive MRSA colonization surveillance program. It is not intended to diagnose MRSA infection nor to guide or monitor treatment for MRSA infections.      Studies: No results found.  Scheduled Meds: . amoxicillin  250 mg Oral Q12H  . cyanocobalamin  1,000 mcg Intramuscular Q30 days  . folic acid  1 mg Oral Daily  . hydrocortisone cream  1 application Topical BID  . pantoprazole  40 mg Oral Daily  . sodium chloride  3 mL Intravenous Q12H   Continuous Infusions:      Time spent: 25 minutes    [facility] Pager [phone]. If 7PM-7AM, please contact night-coverage at [email or web address], password [facility] 05/08/2014, 3:23 PM  LOS: 10 days

## 2014-05-09 ENCOUNTER — Encounter (HOSPITAL_COMMUNITY): Payer: Self-pay | Admitting: Urology

## 2014-05-09 DIAGNOSIS — N183 Chronic kidney disease, stage 3 (moderate): Secondary | ICD-10-CM

## 2014-05-09 DIAGNOSIS — R600 Localized edema: Secondary | ICD-10-CM

## 2014-05-09 LAB — CBC
HCT: 21.7 % — ABNORMAL LOW (ref 39.0–52.0)
HEMATOCRIT: 21.7 % — AB (ref 39.0–52.0)
HEMOGLOBIN: 7.1 g/dL — AB (ref 13.0–17.0)
Hemoglobin: 7.1 g/dL — ABNORMAL LOW (ref 13.0–17.0)
MCH: 30.5 pg (ref 26.0–34.0)
MCH: 30.7 pg (ref 26.0–34.0)
MCHC: 32.7 g/dL (ref 30.0–36.0)
MCHC: 32.7 g/dL (ref 30.0–36.0)
MCV: 93.1 fL (ref 78.0–100.0)
MCV: 93.9 fL (ref 78.0–100.0)
Platelets: 49 10*3/uL — ABNORMAL LOW (ref 150–400)
Platelets: 44 10*3/uL — ABNORMAL LOW (ref 150–400)
RBC: 2.33 MIL/uL — AB (ref 4.22–5.81)
RBC: 2.31 MIL/uL — ABNORMAL LOW (ref 4.22–5.81)
RDW: 18.8 % — AB (ref 11.5–15.5)
RDW: 18.8 % — ABNORMAL HIGH (ref 11.5–15.5)
WBC: 3.6 10*3/uL — AB (ref 4.0–10.5)
WBC: 3.5 10*3/uL — ABNORMAL LOW (ref 4.0–10.5)

## 2014-05-09 LAB — BASIC METABOLIC PANEL
Anion gap: 8 (ref 5–15)
BUN: 24 mg/dL — AB (ref 6–23)
CHLORIDE: 110 meq/L (ref 96–112)
CO2: 21 mEq/L (ref 19–32)
Calcium: 8.1 mg/dL — ABNORMAL LOW (ref 8.4–10.5)
Creatinine, Ser: 1.84 mg/dL — ABNORMAL HIGH (ref 0.50–1.35)
GFR calc non Af Amer: 38 mL/min — ABNORMAL LOW (ref >90)
GFR, EST AFRICAN AMERICAN: 44 mL/min — AB (ref >90)
GLUCOSE: 139 mg/dL — AB (ref 70–99)
POTASSIUM: 4.7 meq/L (ref 3.7–5.3)
Sodium: 139 mEq/L (ref 137–147)

## 2014-05-09 LAB — ABO/RH: ABO/RH(D): A POS

## 2014-05-09 LAB — PREPARE RBC (CROSSMATCH)

## 2014-05-09 MED ORDER — FUROSEMIDE 10 MG/ML IJ SOLN
40.0000 mg | Freq: Once | INTRAMUSCULAR | Status: AC
  Administered 2014-05-09: 40 mg via INTRAVENOUS
  Filled 2014-05-09: qty 4

## 2014-05-09 MED ORDER — SODIUM CHLORIDE 0.9 % IV SOLN
Freq: Once | INTRAVENOUS | Status: AC
  Administered 2014-05-09: 10:00:00 via INTRAVENOUS

## 2014-05-09 MED ORDER — ZOLPIDEM TARTRATE 5 MG PO TABS
5.0000 mg | ORAL_TABLET | Freq: Every evening | ORAL | Status: DC | PRN
  Administered 2014-05-09 – 2014-05-17 (×9): 5 mg via ORAL
  Filled 2014-05-09 (×9): qty 1

## 2014-05-09 NOTE — Progress Notes (Addendum)
[facility] PROGRESS NOTE  [name] ZOX:[id number] DOB: 06/21/1950 DOA: 04/28/2014 PCP: [name], MD  Brief narrative 63 year old male with history of metastatic colon cancer receiving chemotherapy in [place], anemia, coronary artery disease, history of hydronephrosis status post stenting in April 2015, nephrolithiasis, chronic kidney disease stage III who was sent to the ED from his hematology office. Patient reports intermittent hematuria for of about 2-3 weeks duration. Few days back he thought he passed his ureteral stent while using the bathroom. When he presented to his oncologist for chemotherapy he was found to have progressively worsened renal function. His last normal creatinine was 1.67 about one month back which worsened to 7.1 on 11/16. He was followed up again on 11/23 by his oncologist and his creatinine had worsened to 12.47. A renal ultrasound was done which showed moderate bilateral hydronephrosis with nephrocalcinosis. On 11/30 he was found to have bilateral lower extremity purpura and increased leg swellings. Blood work done showed platelets of 52,000 and creatinine of 18.4. He was also found to be in metabolic acidosis with CO2 9 with high anion gap.  A CT scan of the abdomen done in 11/30 showed increasing moderate to severe right hydronephrosis and severe left hydronephrosis. Also showed a 0.9x0.2 cm calculus at the left UPJ and unchanged multiple bilateral renal calculi  Urology was consulted and recommended admission [facility] but [facility].   Patient continues to have hematuria, despite the irrigation. Plan for transferring the patient to [place] for cystoscopy with ureteroscopy for evaluation of the bleeding.     Assessment/Plan: Acute on chronic kidney disease stage III secondary to obstructive uropathy with severe metabolic acidosis  -AG Metabolic acidosis resolved with improvement in  renal fn and bicarb drip . Renal fn improving after foley placed on 11/30. -bedside cystoscopy performed by Dr Jeffie Pollock on 12/3 showing a large amount of old and fresh clot that appears to have come from the left collecting system. There was a clot protruding from the ureteral orifice on the left which was left as is. S/p evacuation of the bladder clots with a 59fr Ainsworth foley. His prostate was short and minimally obstructing and the bladder wall had no evidence of cancer or stones. Foley was removed but had to be placed again on 12/4 due to urinary retention. -most likely source of bleeding are the left ureteral and renal stones,. IR placed rt sided PCN. Still having hematuria. reportedly has blood clots with difficulty flushing out. Continued bladder irrigation. Despite irrigation, he continued to have hematuria and required another unit of prbc transfusion on 12/9. He was transferred to [facility] long for cystoscopy with ureteroscopy to better assess the source of bleeding  -Urology scheduled for inpatient versus outpatient percutaneous nephrolithotomy on 12/15. Patient discharged prior to that will be admitted the previous night to hospitalist service and should received 2 units of platelets early morning to procedure..  Status post cystoscopy with evacuation of clots 12/10, left ureterocopy, currently on CBI anticipated to be discontinued today, restart if  hematuria recurs     Severe Anemia and thrombocytopenia Secondary to active hematuria and blood clots. Also contributed by chemotherapy and metastatic colon cancer. Transfuse 2 units of packed red blood cells again on 12/11 as hemoglobin has drifted down to 7.1 Patient has been receiving both PRBC and platelets. He has ongoing thrombocytopenia possibly related to chemotherapy and liver cirrhosis and improving minimally with transfusion..  Patient has received 11 u PRBC  since admission . received 6 units PRBC during 2 weeks prior to  admission. -Received IV dextran infusion on 12/7.  Repeat CBC in a.m.  Received 8 u platelets since admission with some improvement today.  Seen by Dr. Charlesetta Garibaldi hematology on 12/6. May need to reconsult if bleeding recurs to see platelet transfusion as indicated Will need platelets before procedure on Tuesday      Sepsis with enterococcus bacteriuria Patient febrile and hypotensive on the evening percutaneous nephrostomy was done (12/5). Urine culture from the percutaneous site growing enterococcus. Urine culture from the foley sent.  Blood culture with no growth to date. Spoke with ID consult Dr. Bridget Hartshorn and recommend switching to oral amoxicillin and to continue antibiotic until he has his for lithotripsy done to avoid further sepsis. Since lithotripsy is planned for next week we will treat him for a 2 weeks course. 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 Appreciate recommendations. Follows with oncology in [place].  Palpable purpura Noted HSP/ plantar erythrodysthesia. Reports to be due to chemotherapy Apply topical cream  Protein calorie malnutrition, severe Nutrition consult  B/l leg edema Prn lasix  Mild hypokalemia  Will be repleted as needed. Repeat bmp ordered showed resolution of hypokalemia    DVT prophylaxis: SCD  -Code Status: full code family Communication: none at bedside Disposition Plan: Patient remains inpatient   If bleeding resolves and his H&H and platelets remain stable, he can be discharged home in the next 48 hours and be readmitted to [facility] long hospital on 12/14 for percutaneous nephrolithotomy. It is possible patient may remain inpatient until then due to ongoing hematuria with anemia and thrombocytopenia.   Consultants:  Dr Jeffie Pollock  Renal ( signed off)  IR  hematology  Procedures:  Cystoscopy  on 12/3  left percutaneous nephrostomy on 12/5  Antibiotics:  IV vanco 12/5--12/7  Oral  Amoxicillin 12/7-- until 12/21  Subjective Hematuria has resolved. CBI ongoing, wife by the bedside, no nausea vomiting  Objective: Filed Vitals:   05/09/14 1223  BP: 133/68  Pulse: 75  Temp: 97.6 F (36.4 C)  Resp: 18    Intake/Output Summary (Last 24 hours) at 05/09/14 1241 Last data filed at 05/09/14 0914  Gross per 24 hour  Intake 9098.75 ml  Output   8395 ml  Net 703.75 ml   Filed Weights   05/07/14 1752 05/08/14 1859 05/09/14 0457  Weight: 101.5 kg (223 lb 12.3 oz) 102.195 kg (225 lb 4.8 oz) 99.701 kg (219 lb 12.8 oz)    Exam:   General: Lesion male in no acute distress  HEENT: Pallor present, moist mucosa  Chest: Clear breath sounds bilaterally, left Port-A-Cath  CVS: Normal S1-S2, no murmurs  Abdomen: Soft, nondistended, nontender, ileostomy status, left percutaneous nephrostomy draining bloody urine, Foley with blood mixed urine  Extremities: Palpable purpura over the dorsum of bilateral feet, trace edema bilaterally  CNS: Alert and oriented  Data Reviewed: Basic Metabolic Panel:  Recent Labs Lab 05/06/14 0400 05/07/14 1530 05/08/14 0550 05/08/14 1900 05/09/14 0100  NA 142 137 138 139 139  K 3.4* 3.4* 3.9 4.0 4.7  CL 109 107 109 109 110  CO2 21 20 22 21 21   GLUCOSE 100* 135* 77 111* 139*  BUN 33* 27* 25* 23 24*  CREATININE 2.41* 2.05* 1.95* 1.87* 1.84*  CALCIUM 7.9* 7.8* 8.2* 8.1* 8.1*   Liver Function Tests: No results for input(s):  AST, ALT, ALKPHOS, BILITOT, PROT, ALBUMIN in the last 168 hours. No results for input(s): LIPASE, AMYLASE in the last 168 hours. No results for input(s): AMMONIA in the last 168 hours. CBC:  Recent Labs Lab 05/07/14 1530 05/08/14 0550 05/08/14 1900 05/09/14 0100 05/09/14 0700  WBC 3.4* 6.0 4.4 3.6* 3.5*  HGB 6.7* 8.6* 7.4* 7.1* 7.1*  HCT 20.2* 25.9* 22.3* 21.7* 21.7*  MCV 93.1 92.5 93.3 93.1 93.9  PLT  56* 73* 53* 49* 44*   Cardiac Enzymes: No results for input(s): CKTOTAL, CKMB, CKMBINDEX, TROPONINI in the last 168 hours. BNP (last 3 results) No results for input(s): PROBNP in the last 8760 hours. CBG:  Recent Labs Lab 05/04/14 0754 05/04/14 1027  GLUCAP 118* 138*    Recent Results (from the past 240 hour(s))  Body fluid culture     Status: None   Collection Time: 05/03/14 10:03 AM  Result Value Ref Range Status   Specimen Description FLUID LEFT KIDNEY  Final   Special Requests NONE  Final   Gram Stain   Final    ABUNDANT WBC PRESENT, PREDOMINANTLY PMN MODERATE GRAM POSITIVE COCCI IN PAIRS AND CHAINS Gram Stain Report Called to,Read Back By and Verified With: Gram Stain Report Called to,Read Back By and Verified With: Stefani Dama RN on 05/03/14 at 21:20 by Rise Mu Performed at [facility]    Culture   Final    ABUNDANT ENTEROCOCCUS SPECIES Performed at [facility]    Report Status 05/05/2014 FINAL  Final   Organism ID, Bacteria ENTEROCOCCUS SPECIES  Final      Susceptibility   Enterococcus species - MIC*    VANCOMYCIN 2 SENSITIVE Sensitive     AMPICILLIN <=2 SENSITIVE Sensitive     * ABUNDANT ENTEROCOCCUS SPECIES  Culture, blood (routine x 2)     Status: None (Preliminary result)   Collection Time: 05/03/14  7:26 PM  Result Value Ref Range Status   Specimen Description BLOOD LEFT ARM  Final   Special Requests BOTTLES DRAWN AEROBIC AND ANAEROBIC 5CC EACH  Final   Culture  Setup Time   Final    05/04/2014 03:55 Performed at [facility]    Culture   Final           BLOOD CULTURE RECEIVED NO GROWTH TO DATE CULTURE WILL BE HELD FOR 5 DAYS BEFORE ISSUING A FINAL NEGATIVE REPORT Performed at [facility]    Report Status PENDING  Incomplete  Culture, blood (routine x 2)     Status: None (Preliminary result)   Collection Time: 05/03/14  7:50 PM  Result Value Ref Range Status   Specimen Description BLOOD RIGHT ANTECUBITAL  Final    Special Requests BOTTLES DRAWN AEROBIC AND ANAEROBIC 10CC EACH  Final   Culture  Setup Time   Final    05/04/2014 03:55 Performed at [facility]    Culture   Final           BLOOD CULTURE RECEIVED NO GROWTH TO DATE CULTURE WILL BE HELD FOR 5 DAYS BEFORE ISSUING A FINAL NEGATIVE REPORT Performed at [facility]    Report Status PENDING  Incomplete  Urine culture     Status: None   Collection Time: 05/03/14  8:29 PM  Result Value Ref Range Status   Specimen Description URINE, RANDOM  Final   Special Requests ADDED 1940 05/04/14  Final   Culture  Setup Time   Final    05/04/2014 20:32 Performed at  [facility]    Colony Count NO GROWTH Performed at [facility]   Final   Culture NO GROWTH Performed at [facility]   Final   Report Status 05/05/2014 FINAL  Final  MRSA PCR Screening     Status: None   Collection Time: 05/04/14 10:39 AM  Result Value Ref Range Status   MRSA by PCR NEGATIVE NEGATIVE Final    Comment:        The GeneXpert MRSA Assay (FDA approved for NASAL specimens only), is one component of a comprehensive MRSA colonization surveillance program. It is not intended to diagnose MRSA infection nor to guide or monitor treatment for MRSA infections.      Studies: No results found.  Scheduled Meds: . amoxicillin  250 mg Oral Q12H  . cyanocobalamin  1,000 mcg Intramuscular Q30 days  . folic acid  1 mg Oral Daily  . furosemide  40 mg Intravenous Once  . hydrocortisone cream  1 application Topical BID  . pantoprazole  40 mg Oral Daily  . sodium chloride  3 mL Intravenous Q12H   Continuous Infusions: . lactated ringers 75 mL/hr at 05/08/14 2303       Time spent: 25 minutes    [facility]  [facility] Pager [phone]  If 7PM-7AM, please contact night-coverage at [email or web address], password [facility] 05/09/2014, 12:41 PM  LOS: 11 days

## 2014-05-09 NOTE — Op Note (Signed)
[name], [name]               ACCOUNT NO.:  [id number]  MEDICAL RECORD NO.:  [id number]  LOCATION:  [place]                         FACILITY:  [facility]  PHYSICIAN:  [name]. Jeffie Pollock, M.D.    DATE OF BIRTH:  1950-08-04  DATE OF PROCEDURE:  05/08/2014 DATE OF DISCHARGE:                              OPERATIVE REPORT   PROCEDURE: 1. Cystoscopy with evacuation of clots. 2. Left retrograde pyelogram with interpretation. 3. Left ureteroscopy.  PREOPERATIVE DIAGNOSIS:  Gross hematuria with clot retention and left ureteral bleeding.  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  SURGEON:  [name]. Jeffie Pollock, M.D.  ANESTHESIA:  General.  SPECIMEN:  None.  DRAINS:  A 82-XHBZJI silicone hematuria, 3-way Foley catheter.  BLOOD LOSS:  Approximately 50 mL.  COMPLICATIONS:  None.  INDICATIONS:  [name] is a 63 year old white male, who presented earlier this month with a rising creatinine.  They got up to 18.  He was found to be in clot retention and had a history of stones in the left kidney.  He has actually had a stent for 4 months which he expulsed and voided out prior to admission.  Initial CT scan showed blood in the left kidney as well as in the bladder along with several left renal stones. Subsequently, he has continued to have gross hematuria, but had a nephrostomy tube placed for the obstructing stone and the nephrostomy drainage is cleared while the urethral catheter drainage has remained bloody.  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  FINDINGS AND PROCEDURE:  He was given Unasyn 3 g.  He was taken to the operating room where general anesthetic was induced.  He  was fitted with PAS hose and placed in the lithotomy position.  His perineum and genitalia were prepped with Betadine solution.  He was draped in usual sterile fashion.  Cystoscopy was performed using a 22-French scope and 12-degree lens. Examination revealed a normal urethra.  The external sphincter was intact.  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  Examination of the bladder revealed significant hemorrhagic changes from his prolonged catheterization, multiple irrigations, and episodes of clot retention. There was some oozing from the bladder, but it was not severe.  He continued to have bright red bleeding from the left ureteral orifice. The right ureteral orifice was unremarkable.  A 5-French open-end catheter was passed through the scope up the left ureteral orifice, but there was some J hooking, which made passage proximally difficult.  I did instill contrast and there appeared to be a filling defect or clot right over the area of the iliac artery.  I then passed a Sensor wire through the scope and was able to advance the open- end catheter more proximally.  The wire was removed.  An additional contrast was instilled.  He was noted to have a stone in the proximal ureter with contrast effluxing above that.  The guidewire was then reinserted and passed to the kidney.  At this point, a 6.4-French short ureteroscope was inserted alongside the wire.  However, when I reached the iliac artery, there was considerable fibrosis and medial deviation that precluded passage of the scope.  I attempted to use another guidewire through the scope as a filiform and also attempted to dilate the ureter with a 12-French access sheath inner core, however, I could not get that pass the iliac area.  At this point, I reflected on the options and thought it might be worth considering using Surgiflo or FloSeal in the ureter; however, that  is contraindicated, and we did not proceed with that.  At this point, I elected to remove the wire and placed a hematuria 24- [place] 3-way Foley catheter and contacted Interventional Radiology for angiography with attempted occlusion of the fistula.  This catheter was placed to straight drainage and continuous irrigation.  His anesthetic was reversed, and he was moved to recovery room in stable condition. There were no complications with my portion of the procedure; however, if IR is unsuccessful, we will have to consider an urgent nephrectomy for management of his continual hemorrhage.     [name]. Jeffie Pollock, M.D.     JJW/MEDQ  D:  05/08/2014  T:  05/09/2014  Job:  [id number]

## 2014-05-09 NOTE — Progress Notes (Signed)
Brief progressive note  I was called by Urologist, Dr. Jeffie Pollock and was told that patient had a procedure (CYSTOSCOPY/ EVACUATION OF CLOTS WITH LEFT RETROGRADE PYELOGRAM,LEFT URETEROSCOPY). Per Dr. Jeffie Pollock, patient had large amount of bleeding during the procedure.  I was asked to check on patient and make sure the orders are all right.   I evaluated patient after he was transferred to the floor. He reports that he is doing fine and has no specific complaints. He feels little weak. He does not have fever, chills, nausea, vomiting, abdominal pain. Does not have blood coming out from Foley catheter.  Objective: Vital signs in last 24 hours: Filed Vitals:   05/08/14 1830 05/08/14 1850 05/08/14 1859 05/08/14 2156  BP: 124/58 122/57  127/71  Pulse:  77  79  Temp: 98 F (36.7 C) 98 F (36.7 C)  98.2 F (36.8 C)  TempSrc:    Oral  Resp:  14  12  Height:   5\' 10"  (1.778 m)   Weight:   102.195 kg (225 lb 4.8 oz)   SpO2:  100%  100%   Weight change: 0.695 kg (1 lb 8.5 oz)  Intake/Output Summary (Last 24 hours) at 05/09/14 0938 Last data filed at 05/09/14 0300  Gross per 24 hour  Intake  16590 ml  Output  15845 ml  Net    745 ml     HEENT: Pallor, moist mucosa Chest: Clear breath sounds bilaterally, left Port-A-Cath CVS: Normal S1-S2, no murmurs Abdomen: Soft, nondistended, nontender, ileostomy status, Foley without blood coming out Extremities: Palpable purpura over the dorsum of bilateral feet, trace edema bilaterally CNS: Alert and oriented  Lab Results: @labtest2 @ Micro Results: Recent Results (from the past 240 hour(s))  Body fluid culture     Status: None   Collection Time: 05/03/14 10:03 AM  Result Value Ref Range Status   Specimen Description FLUID LEFT KIDNEY  Final   Special Requests NONE  Final   Gram Stain   Final    ABUNDANT WBC PRESENT, PREDOMINANTLY PMN MODERATE GRAM POSITIVE COCCI IN PAIRS AND CHAINS Gram Stain Report Called to,Read Back By and Verified With: Gram  Stain Report Called to,Read Back By and Verified With: Stefani Dama RN on 05/03/14 at 21:20 by Rise Mu Performed at [place]   Final    ABUNDANT ENTEROCOCCUS SPECIES Performed at [facility]    Report Status 05/05/2014 FINAL  Final   Organism ID, Bacteria ENTEROCOCCUS SPECIES  Final      Susceptibility   Enterococcus species - MIC*    VANCOMYCIN 2 SENSITIVE Sensitive     AMPICILLIN <=2 SENSITIVE Sensitive     * ABUNDANT ENTEROCOCCUS SPECIES  Culture, blood (routine x 2)     Status: None (Preliminary result)   Collection Time: 05/03/14  7:26 PM  Result Value Ref Range Status   Specimen Description BLOOD LEFT ARM  Final   Special Requests BOTTLES DRAWN AEROBIC AND ANAEROBIC 5CC EACH  Final   Culture  Setup Time   Final    05/04/2014 03:55 Performed at [facility]    Culture   Final           BLOOD CULTURE RECEIVED NO GROWTH TO DATE CULTURE WILL BE HELD FOR 5 DAYS BEFORE ISSUING A FINAL NEGATIVE REPORT Performed at [facility]    Report Status PENDING  Incomplete  Culture, blood (routine x 2)     Status: None (Preliminary result)   Collection  Time: 05/03/14  7:50 PM  Result Value Ref Range Status   Specimen Description BLOOD RIGHT ANTECUBITAL  Final   Special Requests BOTTLES DRAWN AEROBIC AND ANAEROBIC 10CC EACH  Final   Culture  Setup Time   Final    05/04/2014 03:55 Performed at [facility]    Culture   Final           BLOOD CULTURE RECEIVED NO GROWTH TO DATE CULTURE WILL BE HELD FOR 5 DAYS BEFORE ISSUING A FINAL NEGATIVE REPORT Performed at [facility]    Report Status PENDING  Incomplete  Urine culture     Status: None   Collection Time: 05/03/14  8:29 PM  Result Value Ref Range Status   Specimen Description URINE, RANDOM  Final   Special Requests ADDED 1940 05/04/14  Final   Culture  Setup Time   Final    05/04/2014 20:32 Performed at [place] Performed  at [facility]   Final   Culture NO GROWTH Performed at [facility]   Final   Report Status 05/05/2014 FINAL  Final  MRSA PCR Screening     Status: None   Collection Time: 05/04/14 10:39 AM  Result Value Ref Range Status   MRSA by PCR NEGATIVE NEGATIVE Final    Comment:        The GeneXpert MRSA Assay (FDA approved for NASAL specimens only), is one component of a comprehensive MRSA colonization surveillance program. It is not intended to diagnose MRSA infection nor to guide or monitor treatment for MRSA infections.    Studies/Results: No results found. Medications:  Scheduled Meds: . amoxicillin  250 mg Oral Q12H  . cyanocobalamin  1,000 mcg Intramuscular Q30 days  . folic acid  1 mg Oral Daily  . hydrocortisone cream  1 application Topical BID  . pantoprazole  40 mg Oral Daily  . sodium chloride  3 mL Intravenous Q12H   Continuous Infusions: . 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 Patient seems to have tolerated the procedure well though having had large amount of bleeding during the procedure per Dr. Jeffie Pollock. Patient does not have specific complaints except for feeling weak. His Foley has no blood coming out. His Hge is decreased from 8.6 to 7.4. Patient does not have dizziness, shortness of breath or chest pain. Currently patient is hemodynamically stable. His creatinine improved to 1.87 from initial 18.  - will check CBC q6h -Transfuse as necessary (goal of Hgb is 7.0) - BMP in AM     LOS: 11 days   Ivor Costa 05/09/2014, 4:52 AM

## 2014-05-09 NOTE — Progress Notes (Signed)
1 Day Post-Op Subjective: Patient reports that he feels well. He has no pain. He has had no problems with hematuria overnight.  Objective: Vital signs in last 24 hours: Temp:  [97.3 F (36.3 C)-98.7 F (37.1 C)] 97.3 F (36.3 C) (12/11 0457) Pulse Rate:  [76-81] 81 (12/11 0457) Resp:  [12-16] 12 (12/11 0457) BP: (122-144)/(49-71) 130/67 mmHg (12/11 0457) SpO2:  [99 %-100 %] 100 % (12/11 0457) Weight:  [99.701 kg (219 lb 12.8 oz)-102.195 kg (225 lb 4.8 oz)] 99.701 kg (219 lb 12.8 oz) (12/11 0457)  Intake/Output from previous day: 12/10 0701 - 12/11 0700 In: 17858.8 [I.V.:1308.8] Out: 14645 [JJOAC:16606; Stool:200] Intake/Output this shift:    Physical Exam:  Constitutional: Vital signs reviewed. WD WN in NAD   Eyes: PERRL, No scleral icterus.   Pulmonary/Chest: Normal effort Extremities: No cyanosis or edema    Urine, both in catheter bag and nephrostomy bag is clear. Lab Results:  Recent Labs  05/08/14 1900 05/09/14 0100 05/09/14 0700  HGB 7.4* 7.1* 7.1*  HCT 22.3* 21.7* 21.7*   BMET  Recent Labs  05/08/14 1900 05/09/14 0100  NA 139 139  K 4.0 4.7  CL 109 110  CO2 21 21  GLUCOSE 111* 139*  BUN 23 24*  CREATININE 1.87* 1.84*  CALCIUM 8.1* 8.1*   No results for input(s): LABPT, INR in the last 72 hours. No results for input(s): LABURIN in the last 72 hours. Results for orders placed or performed during the hospital encounter of 04/28/14  Body fluid culture     Status: None   Collection Time: 05/03/14 10:03 AM  Result Value Ref Range Status   Specimen Description FLUID LEFT KIDNEY  Final   Special Requests NONE  Final   Gram Stain   Final    ABUNDANT WBC PRESENT, PREDOMINANTLY PMN MODERATE GRAM POSITIVE COCCI IN PAIRS AND CHAINS Gram Stain Report Called to,Read Back By and Verified With: Gram Stain Report Called to,Read Back By and Verified With: Stefani Dama RN on 05/03/14 at 21:20 by Rise Mu Performed at [facility]    Culture   Final     ABUNDANT ENTEROCOCCUS SPECIES Performed at [facility]    Report Status 05/05/2014 FINAL  Final   Organism ID, Bacteria ENTEROCOCCUS SPECIES  Final      Susceptibility   Enterococcus species - MIC*    VANCOMYCIN 2 SENSITIVE Sensitive     AMPICILLIN <=2 SENSITIVE Sensitive     * ABUNDANT ENTEROCOCCUS SPECIES  Culture, blood (routine x 2)     Status: None (Preliminary result)   Collection Time: 05/03/14  7:26 PM  Result Value Ref Range Status   Specimen Description BLOOD LEFT ARM  Final   Special Requests BOTTLES DRAWN AEROBIC AND ANAEROBIC 5CC EACH  Final   Culture  Setup Time   Final    05/04/2014 03:55 Performed at [facility]    Culture   Final           BLOOD CULTURE RECEIVED NO GROWTH TO DATE CULTURE WILL BE HELD FOR 5 DAYS BEFORE ISSUING A FINAL NEGATIVE REPORT Performed at [facility]    Report Status PENDING  Incomplete  Culture, blood (routine x 2)     Status: None (Preliminary result)   Collection Time: 05/03/14  7:50 PM  Result Value Ref Range Status   Specimen Description BLOOD RIGHT ANTECUBITAL  Final   Special Requests BOTTLES DRAWN AEROBIC AND ANAEROBIC 10CC EACH  Final   Culture  Setup Time   Final    05/04/2014 03:55 Performed at [facility]    Culture   Final           BLOOD CULTURE RECEIVED NO GROWTH TO DATE CULTURE WILL BE HELD FOR 5 DAYS BEFORE ISSUING A FINAL NEGATIVE REPORT Performed at [facility]    Report Status PENDING  Incomplete  Urine culture     Status: None   Collection Time: 05/03/14  8:29 PM  Result Value Ref Range Status   Specimen Description URINE, RANDOM  Final   Special Requests ADDED 1940 05/04/14  Final   Culture  Setup Time   Final    05/04/2014 20:32 Performed at [place] Performed at [facility]   Final   Culture NO GROWTH Performed at [facility]   Final   Report Status 05/05/2014 FINAL  Final  MRSA PCR Screening      Status: None   Collection Time: 05/04/14 10:39 AM  Result Value Ref Range Status   MRSA by PCR NEGATIVE NEGATIVE Final    Comment:        The GeneXpert MRSA Assay (FDA approved for NASAL specimens only), is one component of a comprehensive MRSA colonization surveillance program. It is not intended to diagnose MRSA infection nor to guide or monitor treatment for MRSA infections.     Studies/Results: No results found.  Assessment/Plan:   Significant hematuria, rule out vascular fistula with ureter. His urine is clear, hemoglobin is stable. His diet will be advanced, CBI will be discontinued. It can be restarted if gross hematuria increases. Her cutaneous procedure scheduled for Tuesday.   LOS: 11 days   Franchot Gallo M 05/09/2014, 7:51 AM

## 2014-05-09 NOTE — Progress Notes (Signed)
PT Cancellation Note  Patient Details Name: [name] MRN: [id number] DOB: 01/16/1951   Cancelled Treatment:     HgB 7.1 scheduled to receive blood.   Rica Koyanagi  PTA [facility]  Acute  Rehab Pager      [phone]

## 2014-05-10 LAB — CULTURE, BLOOD (ROUTINE X 2)
CULTURE: NO GROWTH
Culture: NO GROWTH

## 2014-05-10 LAB — TYPE AND SCREEN
ABO/RH(D): A POS
ANTIBODY SCREEN: NEGATIVE
Unit division: 0
Unit division: 0

## 2014-05-10 LAB — COMPREHENSIVE METABOLIC PANEL
ALBUMIN: 2.2 g/dL — AB (ref 3.5–5.2)
ALT: 46 U/L (ref 0–53)
ANION GAP: 10 (ref 5–15)
AST: 30 U/L (ref 0–37)
Alkaline Phosphatase: 104 U/L (ref 39–117)
BUN: 27 mg/dL — AB (ref 6–23)
CO2: 21 mEq/L (ref 19–32)
Calcium: 8.2 mg/dL — ABNORMAL LOW (ref 8.4–10.5)
Chloride: 110 mEq/L (ref 96–112)
Creatinine, Ser: 2.11 mg/dL — ABNORMAL HIGH (ref 0.50–1.35)
GFR calc Af Amer: 37 mL/min — ABNORMAL LOW (ref >90)
GFR calc non Af Amer: 32 mL/min — ABNORMAL LOW (ref >90)
Glucose, Bld: 91 mg/dL (ref 70–99)
POTASSIUM: 3.7 meq/L (ref 3.7–5.3)
Sodium: 141 mEq/L (ref 137–147)
TOTAL PROTEIN: 5.1 g/dL — AB (ref 6.0–8.3)
Total Bilirubin: 1.2 mg/dL (ref 0.3–1.2)

## 2014-05-10 LAB — CBC
HCT: 27.7 % — ABNORMAL LOW (ref 39.0–52.0)
Hemoglobin: 9 g/dL — ABNORMAL LOW (ref 13.0–17.0)
MCH: 29.4 pg (ref 26.0–34.0)
MCHC: 32.5 g/dL (ref 30.0–36.0)
MCV: 90.5 fL (ref 78.0–100.0)
Platelets: 54 10*3/uL — ABNORMAL LOW (ref 150–400)
RBC: 3.06 MIL/uL — ABNORMAL LOW (ref 4.22–5.81)
RDW: 21.2 % — AB (ref 11.5–15.5)
WBC: 7.3 10*3/uL (ref 4.0–10.5)

## 2014-05-10 MED ORDER — SODIUM CHLORIDE 0.9 % IV SOLN
INTRAVENOUS | Status: DC
  Administered 2014-05-10: 11:00:00 via INTRAVENOUS

## 2014-05-10 NOTE — Progress Notes (Signed)
Patient ID: [name], male   DOB: 11/30/1950, 63 y.o.   MRN: [id number] 2 Days Post-Op   Subjective:  Patient was taken to OR on 12/10 and distal ureter was unable to be traversed to fully evaluate the ureter.  Initial plan was to have him go to VIR for angiography and possible embolization of ureteral fistual to iliac but in PACU hematuria resolved.  Decision was made to manage him conservatively and since that time his urine has remained non-bloody.  He received two units of PRBC yesterday with good response.  This morning is feeling well.  CBI is off and foley is draining yellow urine.  ROS:  Review of Systems  Constitutional: Negative for fever.  Gastrointestinal: Negative for nausea.    Anti-infectives: Anti-infectives    Start     Dose/Rate Route Frequency Ordered Stop   05/08/14 1645  Ampicillin-Sulbactam (UNASYN) 3 g in sodium chloride 0.9 % 100 mL IVPB     3 g100 mL/hr over 60 Minutes Intravenous To Surgery 05/08/14 1631 05/08/14 1656   05/05/14 1300  amoxicillin (AMOXIL) capsule 250 mg     250 mg Oral Every 12 hours 05/05/14 1227     05/05/14 1215  amoxicillin (AMOXIL) chewable tablet 250 mg  Status:  Discontinued     250 mg Oral Every 12 hours 05/05/14 1214 05/05/14 1227   05/03/14 2200  vancomycin (VANCOCIN) IVPB 1000 mg/200 mL premix  Status:  Discontinued     1,000 mg200 mL/hr over 60 Minutes Intravenous Every 24 hours 05/03/14 2151 05/05/14 1214   05/03/14 1200  ceFAZolin (ANCEF) IVPB 2 g/50 mL premix     2 g100 mL/hr over 30 Minutes Intravenous On call 05/02/14 1254 05/03/14 1011   05/03/14 0914  ceFAZolin (ANCEF) 2-3 GM-% IVPB SOLR    Comments:  Shaaron Adler   : cabinet override      05/03/14 0914 05/03/14 2114      Current Facility-Administered Medications  Medication Dose Route Frequency Provider Last Rate Last Dose  . acetaminophen (TYLENOL) tablet 650 mg  650 mg Oral Q6H PRN Nishant Dhungel, MD   650 mg at 05/03/14 1810  . amoxicillin (AMOXIL) capsule  250 mg  250 mg Oral Q12H Nishant Dhungel, MD   250 mg at 05/09/14 2221  . cyanocobalamin ((VITAMIN B-12)) injection 1,000 mcg  1,000 mcg Intramuscular Q30 days Shanda Howells, MD   1,000 mcg at 04/29/14 0947  . folic acid (FOLVITE) tablet 1 mg  1 mg Oral Daily Shanda Howells, MD   1 mg at 05/09/14 1012  . HYDROcodone-acetaminophen (NORCO/VICODIN) 5-325 MG per tablet 1-2 tablet  1-2 tablet Oral Q6H PRN Shanda Howells, MD   2 tablet at 05/06/14 1500  . hydrocortisone cream 1 % 1 application  1 application Topical BID Shanda Howells, MD   1 application at 60/63/01 2221  . hydrocortisone cream 1 %   Topical TID PRN Carmin Muskrat, MD      . HYDROmorphone (DILAUDID) injection 0.25-0.5 mg  0.25-0.5 mg Intravenous Q5 min PRN Nickie Retort, MD      . HYDROmorphone (DILAUDID) injection 2 mg  2 mg Intravenous Q4H PRN Nishant Dhungel, MD   2 mg at 05/08/14 0621  . lactated ringers infusion   Intravenous Continuous Malka So, MD 75 mL/hr at 05/09/14 2237    . meperidine (DEMEROL) injection 6.25-12.5 mg  6.25-12.5 mg Intravenous Q5 min PRN Nickie Retort, MD      . nystatin (MYCOSTATIN/NYSTOP) topical powder  Topical BID PRN Nishant Dhungel, MD      . ondansetron (ZOFRAN) tablet 8 mg  8 mg Oral BID PRN Shanda Howells, MD      . pantoprazole (PROTONIX) EC tablet 40 mg  40 mg Oral Daily Shanda Howells, MD   40 mg at 05/09/14 1012  . promethazine (PHENERGAN) injection 6.25-12.5 mg  6.25-12.5 mg Intravenous Q15 min PRN Nickie Retort, MD      . sodium chloride 0.9 % injection 10-40 mL  10-40 mL Intracatheter PRN Nishant Dhungel, MD   20 mL at 05/08/14 0550  . sodium chloride 0.9 % injection 10-40 mL  10-40 mL Intracatheter PRN Malka So, MD   10 mL at 05/10/14 0505  . sodium chloride 0.9 % injection 3 mL  3 mL Intravenous Q12H Shanda Howells, MD   3 mL at 05/09/14 1016  . zolpidem (AMBIEN) tablet 5 mg  5 mg Oral QHS PRN Eugenie Filler, MD   5 mg at 05/09/14 2331   Facility-Administered Medications  Ordered in Other Encounters  Medication Dose Route Frequency Provider Last Rate Last Dose  . 0.9 %  sodium chloride infusion   Intravenous Continuous Pieter Partridge, MD 20 mL/hr at 07/17/12 1113    . heparin lock flush 100 unit/mL  500 Units Intravenous Once Pieter Partridge, MD      . sodium chloride 0.9 % injection 10 mL  10 mL Intravenous PRN Pieter Partridge, MD   10 mL at 07/17/12 1113     Objective: Vital signs in last 24 hours: Temp:  [97.4 F (36.3 C)-98.7 F (37.1 C)] 98.1 F (36.7 C) (12/12 0617) Pulse Rate:  [62-80] 62 (12/12 0617) Resp:  [18-20] 18 (12/12 0617) BP: (108-133)/(54-68) 130/59 mmHg (12/12 0617) SpO2:  [96 %-100 %] 100 % (12/12 0617)  Intake/Output from previous day: 12/11 0701 - 12/12 0700 In: 5052.5 [P.O.:1020; I.V.:675; Blood:957.5] Out: 6225 [Urine:4250; RSWNI:6270] Intake/Output this shift: Total I/O In: -  Out: 75 [Urine:75]   Physical Exam  Constitutional: He is well-developed, well-nourished, and in no distress.  Genitourinary:  Urine is yellow with CBI off   Lab Results:   Recent Labs  05/09/14 0700 05/10/14 0505  WBC 3.5* 7.3  HGB 7.1* 9.0*  HCT 21.7* 27.7*  PLT 44* 54*   BMET  Recent Labs  05/09/14 0100 05/10/14 0505  NA 139 141  K 4.7 3.7  CL 110 110  CO2 21 21  GLUCOSE 139* 91  BUN 24* 27*  CREATININE 1.84* 2.11*  CALCIUM 8.1* 8.2*   PT/INR No results for input(s): LABPROT, INR in the last 72 hours. ABG No results for input(s): PHART, HCO3 in the last 72 hours.  Invalid input(s): PCO2, PO2  Studies/Results: No results found.  I have reviewed his recent labs.   Assessment: Hematuria has resolved since his procedure and urine from both nephrostomy and bladder is non-bloody.  He required transfusion yesterday but has been hemodynamically stable.  Plan: Continue to monitor in house.   Patient is currently on the schedule for percutaneous stone treatment on Tuesday.  If he remains stable without hematuria  this remains an option Should hematuria recur, would recommend VIR angiography and embolization as was previously disussed    LOS: 12 days    Baltazar Najjar, Will 05/10/2014

## 2014-05-10 NOTE — Progress Notes (Addendum)
[facility] PROGRESS NOTE  [name] HYI:[id number] DOB: 10-Jan-1951 DOA: 04/28/2014 PCP: [name], MD  Brief narrative 63 year old male with history of metastatic colon cancer receiving chemotherapy in [place], anemia, coronary artery disease, history of hydronephrosis status post stenting in April 2015, nephrolithiasis, chronic kidney disease stage III who was sent to the ED from his hematology office. Patient reports intermittent hematuria for of about 2-3 weeks duration. Few days back he thought he passed his ureteral stent while using the bathroom. When he presented to his oncologist for chemotherapy he was found to have progressively worsened renal function. His last normal creatinine was 1.67 about one month back which worsened to 7.1 on 11/16. He was followed up again on 11/23 by his oncologist and his creatinine had worsened to 12.47. A renal ultrasound was done which showed moderate bilateral hydronephrosis with nephrocalcinosis. On 11/30 he was found to have bilateral lower extremity purpura and increased leg swellings. Blood work done showed platelets of 52,000 and creatinine of 18.4. He was also found to be in metabolic acidosis with CO2 9 with high anion gap.  A CT scan of the abdomen done in 11/30 showed increasing moderate to severe right hydronephrosis and severe left hydronephrosis. Also showed a 0.9x0.2 cm calculus at the left UPJ and unchanged multiple bilateral renal calculi  Urology was consulted and recommended admission to [facility] initially but [facility].   Patient continues to have hematuria, despite the irrigation. Plan for transferring the patient to [place] for cystoscopy with ureteroscopy for evaluation of the bleeding.     Assessment/Plan: Acute on chronic kidney disease stage III secondary to obstructive uropathy with severe metabolic acidosis  -AG Metabolic acidosis resolved with improvement in  renal fn and bicarb drip . Renal fn improving after foley placed on 11/30. -bedside cystoscopy performed by Dr Jeffie Pollock on 12/3 showing a large amount of old and fresh clot that appears to have come from the left collecting system. There was a clot protruding from the ureteral orifice on the left which was left as is. S/p evacuation of the bladder clots with a 53fr [place] foley. His prostate was short and minimally obstructing and the bladder wall had no evidence of cancer or stones. Foley was removed but had to be placed again on 12/4 due to urinary retention. -most likely source of bleeding are the left ureteral and renal stones,. IR placed rt sided PCN. Still having hematuria. reportedly has blood clots with difficulty flushing out. Continued bladder irrigation. Despite irrigation, he continued to have hematuria and required another unit of prbc transfusion on 12/9. He was transferred to [facility] for cystoscopy with ureteroscopy to better assess the source of bleeding  -Urology scheduled for inpatient versus outpatient percutaneous nephrolithotomy on 12/15. Patient  should received 2 units of platelets early morning to procedure..  Status post cystoscopy with evacuation of clots 12/10, left ureterocopy,  CBI  discontinued 12/11, restart if  hematuria recurs Per Dr Effie Shy Should hematuria recur, patient needs VIR angiography and embolization Creatinine stable   Severe Anemia and thrombocytopenia Secondary to active hematuria and blood clots. Also contributed by chemotherapy and metastatic colon cancer.  Transfused 2 units of packed red blood cells again on 12/11  Patient has been receiving both PRBC and platelets. He has ongoing thrombocytopenia possibly related to chemotherapy and liver cirrhosis and improving minimally with transfusion.. Patient has received 11 u PRBC since this  admission . received 6 units PRBC during  2 weeks prior to this  admission. -Received IV dextran infusion  on 12/7.  Repeat CBC 12/14 Seen by Dr. Charlesetta Garibaldi hematology on 12/6. May need to reconsult if bleeding recurs to see  If platelet transfusion as indicated Will need platelets before procedure on Tuesday 12/15     Sepsis with enterococcus bacteriuria Patient febrile and hypotensive on the evening percutaneous nephrostomy was done (12/5). Urine culture from the percutaneous site growing enterococcus. Currently on amoxicillin Blood culture with no growth to date. Spoke with ID consult Dr. Bridget Hartshorn and recommend switching to oral amoxicillin and to continue antibiotic until he has his for lithotripsy done to avoid further sepsis. Since lithotripsy is planned for next week we will treat him for a 2 weeks course. 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 Appreciate recommendations. Follows with oncology in [place].  Palpable purpura Noted HSP/ plantar erythrodysthesia. Reports to be due to chemotherapy Apply topical cream  Protein calorie malnutrition, severe Nutrition consult  B/l leg edema Prn lasix  Mild hypokalemia  Will be repleted as needed. Repeat bmp ordered showed resolution of hypokalemia    DVT prophylaxis: SCD  -Code Status: full code family Communication: none at bedside Disposition Plan: Patient remains inpatient   If bleeding resolves and his H&H and platelets remain stable, he can be discharged home in the next 48 hours and be readmitted to [facility] on 12/14 for percutaneous nephrolithotomy. It is possible patient may remain inpatient until then due to ongoing hematuria with anemia and thrombocytopenia.   Consultants:  Dr Jeffie Pollock  Renal ( signed off)  IR  hematology  Procedures:  Cystoscopy on 12/3  left percutaneous nephrostomy on 12/5 12/11:  1. Cystoscopy with evacuation of clots.             2. Left  retrograde pyelogram with interpretation.             3. Left ureteroscopy.  Antibiotics:  IV vanco 12/5--12/7  Oral  Amoxicillin 12/7-- until 12/21  Subjective Patient reports that he feels well. He has no pain. He has had no problems with hematuria overnight.  Objective: Filed Vitals:   05/10/14 0617  BP: 130/59  Pulse: 62  Temp: 98.1 F (36.7 C)  Resp: 18    Intake/Output Summary (Last 24 hours) at 05/10/14 1038 Last data filed at 05/10/14 1021  Gross per 24 hour  Intake 5292.5 ml  Output   6600 ml  Net -1307.5 ml   Filed Weights   05/07/14 1752 05/08/14 1859 05/09/14 0457  Weight: 101.5 kg (223 lb 12.3 oz) 102.195 kg (225 lb 4.8 oz) 99.701 kg (219 lb 12.8 oz)    Exam:   General: Lesion male in no acute distress  HEENT: Pallor present, moist mucosa  Chest: Clear breath sounds bilaterally, left Port-A-Cath  CVS: Normal S1-S2, no murmurs  Abdomen: Soft, nondistended, nontender, ileostomy status, left percutaneous nephrostomy draining bloody urine, Foley with blood mixed urine  Extremities: Palpable purpura over the dorsum of bilateral feet, trace edema bilaterally  CNS: Alert and oriented  Data Reviewed: Basic Metabolic Panel:  Recent Labs Lab 05/07/14 1530 05/08/14 0550 05/08/14 1900 05/09/14 0100 05/10/14 0505  NA 137 138 139 139 141  K 3.4* 3.9 4.0 4.7 3.7  CL 107 109 109 110 110  CO2 20 22 21 21 21   GLUCOSE 135* 77 111* 139* 91  BUN 27* 25*  23 24* 27*  CREATININE 2.05* 1.95* 1.87* 1.84* 2.11*  CALCIUM 7.8* 8.2* 8.1* 8.1* 8.2*   Liver Function Tests:  Recent Labs Lab 05/10/14 0505  AST 30  ALT 46  ALKPHOS 104  BILITOT 1.2  PROT 5.1*  ALBUMIN 2.2*   No results for input(s): LIPASE, AMYLASE in the last 168 hours. No results for input(s): AMMONIA in the last 168 hours. CBC:  Recent Labs Lab 05/08/14 0550 05/08/14 1900 05/09/14 0100 05/09/14 0700 05/10/14 0505  WBC 6.0 4.4 3.6* 3.5* 7.3  HGB 8.6* 7.4* 7.1* 7.1* 9.0*  HCT  25.9* 22.3* 21.7* 21.7* 27.7*  MCV 92.5 93.3 93.1 93.9 90.5  PLT 73* 53* 49* 44* 54*   Cardiac Enzymes: No results for input(s): CKTOTAL, CKMB, CKMBINDEX, TROPONINI in the last 168 hours. BNP (last 3 results) No results for input(s): PROBNP in the last 8760 hours. CBG:  Recent Labs Lab 05/04/14 0754 05/04/14 1027  GLUCAP 118* 138*    Recent Results (from the past 240 hour(s))  Body fluid culture     Status: None   Collection Time: 05/03/14 10:03 AM  Result Value Ref Range Status   Specimen Description FLUID LEFT KIDNEY  Final   Special Requests NONE  Final   Gram Stain   Final    ABUNDANT WBC PRESENT, PREDOMINANTLY PMN MODERATE GRAM POSITIVE COCCI IN PAIRS AND CHAINS Gram Stain Report Called to,Read Back By and Verified With: Gram Stain Report Called to,Read Back By and Verified With: Stefani Dama RN on 05/03/14 at 21:20 by Rise Mu Performed at [facility]    Culture   Final    ABUNDANT ENTEROCOCCUS SPECIES Performed at [facility]    Report Status 05/05/2014 FINAL  Final   Organism ID, Bacteria ENTEROCOCCUS SPECIES  Final      Susceptibility   Enterococcus species - MIC*    VANCOMYCIN 2 SENSITIVE Sensitive     AMPICILLIN <=2 SENSITIVE Sensitive     * ABUNDANT ENTEROCOCCUS SPECIES  Culture, blood (routine x 2)     Status: None   Collection Time: 05/03/14  7:26 PM  Result Value Ref Range Status   Specimen Description BLOOD LEFT ARM  Final   Special Requests BOTTLES DRAWN AEROBIC AND ANAEROBIC 5CC EACH  Final   Culture  Setup Time   Final    05/04/2014 03:55 Performed at [place]   Final    NO GROWTH 5 DAYS Performed at [facility]    Report Status 05/10/2014 FINAL  Final  Culture, blood (routine x 2)     Status: None   Collection Time: 05/03/14  7:50 PM  Result Value Ref Range Status   Specimen Description BLOOD RIGHT ANTECUBITAL  Final   Special Requests BOTTLES DRAWN AEROBIC AND ANAEROBIC 10CC EACH  Final    Culture  Setup Time   Final    05/04/2014 03:55 Performed at [place]   Final    NO GROWTH 5 DAYS Performed at [facility]    Report Status 05/10/2014 FINAL  Final  Urine culture     Status: None   Collection Time: 05/03/14  8:29 PM  Result Value Ref Range Status   Specimen Description URINE, RANDOM  Final   Special Requests ADDED 1940 05/04/14  Final   Culture  Setup Time   Final    05/04/2014 20:32 Performed at [place] Performed  at [facility]   Final   Culture NO GROWTH Performed at [facility]   Final   Report Status 05/05/2014 FINAL  Final  MRSA PCR Screening     Status: None   Collection Time: 05/04/14 10:39 AM  Result Value Ref Range Status   MRSA by PCR NEGATIVE NEGATIVE Final    Comment:        The GeneXpert MRSA Assay (FDA approved for NASAL specimens only), is one component of a comprehensive MRSA colonization surveillance program. It is not intended to diagnose MRSA infection nor to guide or monitor treatment for MRSA infections.      Studies: No results found.  Scheduled Meds: . amoxicillin  250 mg Oral Q12H  . cyanocobalamin  1,000 mcg Intramuscular Q30 days  . folic acid  1 mg Oral Daily  . hydrocortisone cream  1 application Topical BID  . pantoprazole  40 mg Oral Daily  . sodium chloride  3 mL Intravenous Q12H   Continuous Infusions: . sodium chloride        Time spent: 25 minutes    [facility]  [facility] Pager [phone]  If 7PM-7AM, please contact night-coverage at [email or web address], password [facility] 05/10/2014, 10:38 AM  LOS: 12 days

## 2014-05-11 DIAGNOSIS — D631 Anemia in chronic kidney disease: Secondary | ICD-10-CM

## 2014-05-11 LAB — CBC
HCT: 28.1 % — ABNORMAL LOW (ref 39.0–52.0)
HEMOGLOBIN: 9.1 g/dL — AB (ref 13.0–17.0)
MCH: 29.6 pg (ref 26.0–34.0)
MCHC: 32.4 g/dL (ref 30.0–36.0)
MCV: 91.5 fL (ref 78.0–100.0)
Platelets: 41 10*3/uL — ABNORMAL LOW (ref 150–400)
RBC: 3.07 MIL/uL — ABNORMAL LOW (ref 4.22–5.81)
RDW: 21.1 % — ABNORMAL HIGH (ref 11.5–15.5)
WBC: 2.9 10*3/uL — ABNORMAL LOW (ref 4.0–10.5)

## 2014-05-11 LAB — BASIC METABOLIC PANEL
Anion gap: 11 (ref 5–15)
BUN: 19 mg/dL (ref 6–23)
CO2: 18 mEq/L — ABNORMAL LOW (ref 19–32)
Calcium: 8 mg/dL — ABNORMAL LOW (ref 8.4–10.5)
Chloride: 109 mEq/L (ref 96–112)
Creatinine, Ser: 1.83 mg/dL — ABNORMAL HIGH (ref 0.50–1.35)
GFR calc Af Amer: 44 mL/min — ABNORMAL LOW (ref >90)
GFR calc non Af Amer: 38 mL/min — ABNORMAL LOW (ref >90)
Glucose, Bld: 115 mg/dL — ABNORMAL HIGH (ref 70–99)
POTASSIUM: 3.5 meq/L — AB (ref 3.7–5.3)
Sodium: 138 mEq/L (ref 137–147)

## 2014-05-11 MED ORDER — PHYTONADIONE 5 MG PO TABS: 2.5000 mg | ORAL_TABLET | Freq: Every day | ORAL | Status: DC

## 2014-05-11 MED ORDER — VITAMIN K1 10 MG/ML IJ SOLN
10.0000 mg | Freq: Every day | INTRAMUSCULAR | Status: AC
  Administered 2014-05-11 – 2014-05-12 (×2): 10 mg via SUBCUTANEOUS
  Filled 2014-05-11 (×3): qty 1

## 2014-05-11 MED ORDER — SODIUM CHLORIDE 0.9 % IV SOLN
1020.0000 mg | Freq: Once | INTRAVENOUS | Status: AC
  Administered 2014-05-12: 1020 mg via INTRAVENOUS
  Filled 2014-05-11: qty 34

## 2014-05-11 NOTE — Progress Notes (Signed)
Patient ID: [name], male   DOB: 1950-09-10, 63 y.o.   MRN: [id number] 3 Days Post-Op   Subjective:  Patient was taken to OR on 12/10 and distal ureter was unable to be traversed to fully evaluate the ureter.  Initial plan was to have him go to VIR for angiography and possible embolization of ureteral fistual to iliac but in PACU hematuria resolved.  Decision was made to manage him conservatively and since that time his urine has remained non-bloody.  He received two units of PRBC on 12/11 with good response.    He had no issues over the past 24 hours.  Pain has been minimal and he has had no gross hematuria.  Left nephrostomy tube draining well.  He is eating regular diet without issue.  Ambulating well.  ROS:  Review of Systems  Constitutional: Negative for fever.  Gastrointestinal: Negative for nausea.    Anti-infectives: Anti-infectives    Start     Dose/Rate Route Frequency Ordered Stop   05/08/14 1645  Ampicillin-Sulbactam (UNASYN) 3 g in sodium chloride 0.9 % 100 mL IVPB     3 g100 mL/hr over 60 Minutes Intravenous To Surgery 05/08/14 1631 05/08/14 1656   05/05/14 1300  amoxicillin (AMOXIL) capsule 250 mg     250 mg Oral Every 12 hours 05/05/14 1227     05/05/14 1215  amoxicillin (AMOXIL) chewable tablet 250 mg  Status:  Discontinued     250 mg Oral Every 12 hours 05/05/14 1214 05/05/14 1227   05/03/14 2200  vancomycin (VANCOCIN) IVPB 1000 mg/200 mL premix  Status:  Discontinued     1,000 mg200 mL/hr over 60 Minutes Intravenous Every 24 hours 05/03/14 2151 05/05/14 1214   05/03/14 1200  ceFAZolin (ANCEF) IVPB 2 g/50 mL premix     2 g100 mL/hr over 30 Minutes Intravenous On call 05/02/14 1254 05/03/14 1011   05/03/14 0914  ceFAZolin (ANCEF) 2-3 GM-% IVPB SOLR    Comments:  Shaaron Adler   : cabinet override      05/03/14 0914 05/03/14 2114      Current Facility-Administered Medications  Medication Dose Route Frequency Provider Last Rate Last Dose  . acetaminophen  (TYLENOL) tablet 650 mg  650 mg Oral Q6H PRN Nishant Dhungel, MD   650 mg at 05/03/14 1810  . amoxicillin (AMOXIL) capsule 250 mg  250 mg Oral Q12H Nishant Dhungel, MD   250 mg at 05/10/14 2239  . cyanocobalamin ((VITAMIN B-12)) injection 1,000 mcg  1,000 mcg Intramuscular Q30 days Shanda Howells, MD   1,000 mcg at 04/29/14 0947  . [START ON 05/12/2014] ferumoxytol (FERAHEME) 1,020 mg in sodium chloride 0.9 % 100 mL IVPB  1,020 mg Intravenous Once Volanda Napoleon, MD      . folic acid (FOLVITE) tablet 1 mg  1 mg Oral Daily Shanda Howells, MD   1 mg at 05/10/14 1030  . HYDROcodone-acetaminophen (NORCO/VICODIN) 5-325 MG per tablet 1-2 tablet  1-2 tablet Oral Q6H PRN Shanda Howells, MD   2 tablet at 05/10/14 2029  . hydrocortisone cream 1 % 1 application  1 application Topical BID Shanda Howells, MD   1 application at 01/75/10 2243  . hydrocortisone cream 1 %   Topical TID PRN Carmin Muskrat, MD      . HYDROmorphone (DILAUDID) injection 0.25-0.5 mg  0.25-0.5 mg Intravenous Q5 min PRN Nickie Retort, MD      . HYDROmorphone (DILAUDID) injection 2 mg  2 mg Intravenous Q4H PRN Louellen Molder, MD  2 mg at 05/11/14 0308  . meperidine (DEMEROL) injection 6.25-12.5 mg  6.25-12.5 mg Intravenous Q5 min PRN Nickie Retort, MD      . nystatin (MYCOSTATIN/NYSTOP) topical powder   Topical BID PRN Nishant Dhungel, MD      . ondansetron (ZOFRAN) tablet 8 mg  8 mg Oral BID PRN Shanda Howells, MD      . pantoprazole (PROTONIX) EC tablet 40 mg  40 mg Oral Daily Shanda Howells, MD   40 mg at 05/10/14 1030  . phytonadione (VITAMIN K) SQ injection 10 mg  10 mg Subcutaneous Daily Volanda Napoleon, MD      . promethazine (PHENERGAN) injection 6.25-12.5 mg  6.25-12.5 mg Intravenous Q15 min PRN Nickie Retort, MD      . sodium chloride 0.9 % injection 10-40 mL  10-40 mL Intracatheter PRN Nishant Dhungel, MD   20 mL at 05/08/14 0550  . sodium chloride 0.9 % injection 10-40 mL  10-40 mL Intracatheter PRN Malka So, MD   20  mL at 05/11/14 0839  . sodium chloride 0.9 % injection 3 mL  3 mL Intravenous Q12H Shanda Howells, MD   3 mL at 05/09/14 1016  . zolpidem (AMBIEN) tablet 5 mg  5 mg Oral QHS PRN Eugenie Filler, MD   5 mg at 05/10/14 2242   Facility-Administered Medications Ordered in Other Encounters  Medication Dose Route Frequency Provider Last Rate Last Dose  . heparin lock flush 100 unit/mL  500 Units Intravenous Once Pieter Partridge, MD      . sodium chloride 0.9 % injection 10 mL  10 mL Intravenous PRN Pieter Partridge, MD   10 mL at 07/17/12 1113     Objective: Vital signs in last 24 hours: Temp:  [97.9 F (36.6 C)-98.2 F (36.8 C)] 98.2 F (36.8 C) (12/13 0522) Pulse Rate:  [72-78] 77 (12/13 0522) Resp:  [18] 18 (12/13 0522) BP: (124-125)/(57-65) 124/64 mmHg (12/13 0522) SpO2:  [100 %] 100 % (12/13 0522) Weight:  [98.2 kg (216 lb 7.9 oz)] 98.2 kg (216 lb 7.9 oz) (12/13 0522)  Intake/Output from previous day: 12/12 0701 - 12/13 0700 In: 2700 [P.O.:2400; I.V.:300] Out: 3850 [Urine:1800; Stool:2050] Intake/Output this shift:     Physical Exam  Constitutional: He is well-developed, well-nourished, and in no distress.  Genitourinary:  Urine is yellow with CBI off   Lab Results:   Recent Labs  05/09/14 0700 05/10/14 0505  WBC 3.5* 7.3  HGB 7.1* 9.0*  HCT 21.7* 27.7*  PLT 44* 54*   BMET  Recent Labs  05/09/14 0100 05/10/14 0505  NA 139 141  K 4.7 3.7  CL 110 110  CO2 21 21  GLUCOSE 139* 91  BUN 24* 27*  CREATININE 1.84* 2.11*  CALCIUM 8.1* 8.2*   PT/INR No results for input(s): LABPROT, INR in the last 72 hours. ABG No results for input(s): PHART, HCO3 in the last 72 hours.  Invalid input(s): PCO2, PO2  Studies/Results: No results found.  I have reviewed his recent labs.   Assessment: Hematuria has resolved since his procedure and urine from both nephrostomy and bladder is non-bloody.  He required transfusion on 12/11 but has been hemodynamically stable.   Repeat CBC today still pending.  Plan: Continue to monitor in house through scheduled surgery on Tuesday Patient is currently on the schedule for percutaneous stone treatment on Tuesday Should hematuria recur, would recommend VIR angiography and embolization as was previously discussed Will DC foley and  IV fluids for comfort and ease of ambulation Follow up CBC this AM    LOS: 13 days    Baltazar Najjar, Will 05/11/2014

## 2014-05-11 NOTE — Progress Notes (Signed)
[facility] PROGRESS NOTE  [name] MALCOMB GEX:[id number] DOB: December 30, 1950 DOA: 04/28/2014 PCP: [name], MD  Brief narrative 63 year old male with history of metastatic colon cancer receiving chemotherapy in [place], anemia, coronary artery disease, history of hydronephrosis status post stenting in April 2015, nephrolithiasis, chronic kidney disease stage III who was sent to the ED from his hematology office. Patient reports intermittent hematuria for of about 2-3 weeks duration. Few days back he thought he passed his ureteral stent while using the bathroom. When he presented to his oncologist for chemotherapy he was found to have progressively worsened renal function. His last normal creatinine was 1.67 about one month back which worsened to 7.1 on 11/16. He was followed up again on 11/23 by his oncologist and his creatinine had worsened to 12.47. A renal ultrasound was done which showed moderate bilateral hydronephrosis with nephrocalcinosis. On 11/30 he was found to have bilateral lower extremity purpura and increased leg swellings. Blood work done showed platelets of 52,000 and creatinine of 18.4. He was also found to be in metabolic acidosis with CO2 9 with high anion gap.  A CT scan of the abdomen done in 11/30 showed increasing moderate to severe right hydronephrosis and severe left hydronephrosis. Also showed a 0.9x0.2 cm calculus at the left UPJ and unchanged multiple bilateral renal calculi  Urology was consulted and recommended admission to [facility] initially but [facility] was [facility].   Patient continues to have hematuria, despite the irrigation. Plan for transferring the patient to [place] for cystoscopy with ureteroscopy for evaluation of the bleeding.     Assessment/Plan: Acute on chronic kidney disease stage III secondary to obstructive uropathy with severe metabolic acidosis  -AG Metabolic acidosis resolved with improvement in  renal fn and bicarb drip . Renal fn improving after foley placed on 11/30. -bedside cystoscopy performed by Dr Jeffie Pollock on 12/3 showing a large amount of old and fresh clot that appears to have come from the left collecting system. There was a clot protruding from the ureteral orifice on the left which was left as is. S/p evacuation of the bladder clots with a 40fr [place] foley. His prostate was short and minimally obstructing and the bladder wall had no evidence of cancer or stones. Foley was removed but had to be placed again on 12/4 due to urinary retention. -most likely source of bleeding are the left ureteral and renal stones,. IR placed rt sided PCN. Still having hematuria. reportedly has blood clots with difficulty flushing out. Continued bladder irrigation. Despite irrigation, he continued to have hematuria and required another unit of prbc transfusion on 12/9. He was transferred to [facility] long for cystoscopy with ureteroscopy to better assess the source of bleeding  -Urology scheduled for inpatient versus outpatient percutaneous nephrolithotomy on 12/15. Patient  should received 2 units of platelets early morning to procedure..  Status post cystoscopy with evacuation of clots 12/10, left ureterocopy,  CBI  discontinued 12/11, restart if  hematuria recurs Per Dr Effie Shy Should hematuria recur, patient needs VIR angiography and embolization Creatinine stable   Severe Anemia and thrombocytopenia Secondary to active hematuria and blood clots. Also contributed by chemotherapy and metastatic colon cancer.  Transfused 2 units of packed red blood cells again on 12/11  Patient has been receiving both PRBC and platelets. He has ongoing thrombocytopenia possibly related to chemotherapy and liver cirrhosis and improving minimally with transfusion.. Patient has received 11 u PRBC since this  admission .   6 units PRBC  prior to this admission. -Received IV dextran infusion on 12/7.  Repeat CBC  12/14 Seen by Dr. Charlesetta Garibaldi hematology on 12/6.  transfuse platelet prior to procedure  Will need platelets before procedure on Tuesday 12/15 Vit K IM times 3 doses , cont folic acid , repeat INR in am  Really appreciate Dr Loreli Slot help     Sepsis with enterococcus bacteriuria Patient febrile and hypotensive on the evening percutaneous nephrostomy was done (12/5). Urine culture from the percutaneous site growing enterococcus. Currently on amoxicillin Blood culture with no growth to date. Spoke with ID consult Dr. Bridget Hartshorn and recommend switching to oral amoxicillin and to continue antibiotic until he has his for lithotripsy done to avoid further sepsis. Since lithotripsy is planned for next week we will treat him for a 2 weeks course. 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 Appreciate recommendations. Follows with oncology in [place].  Palpable purpura Noted HSP/ plantar erythrodysthesia. Reports to be due to chemotherapy Apply topical cream  Protein calorie malnutrition, severe Nutrition consult  B/l leg edema Prn lasix  Mild hypokalemia  Will be repleted as needed. Repeat bmp ordered showed resolution of hypokalemia    DVT prophylaxis: SCD  -Code Status: full code family Communication: none at bedside Disposition Plan: Patient remains inpatient   If bleeding resolves and his H&H and platelets remain stable, he can be discharged home in the next 48 hours and be readmitted to [facility] hospital on 12/14 for percutaneous nephrolithotomy. It is possible patient may remain inpatient until then due to ongoing hematuria with anemia and thrombocytopenia.   Consultants:  Dr Jeffie Pollock  Renal ( signed off)  IR  hematology  Procedures:  Cystoscopy on 12/3  left percutaneous nephrostomy on 12/5 12/11:  1. Cystoscopy with evacuation  of clots.             2. Left retrograde pyelogram with interpretation.             3. Left ureteroscopy.  Antibiotics:  IV vanco 12/5--12/7  Oral  Amoxicillin 12/7-- until 12/21  Subjective Patient reports that he feels well. He has no pain. He has had no problems with hematuria overnight.  Objective: Filed Vitals:   05/11/14 0522  BP: 124/64  Pulse: 77  Temp: 98.2 F (36.8 C)  Resp: 18    Intake/Output Summary (Last 24 hours) at 05/11/14 1359 Last data filed at 05/11/14 1159  Gross per 24 hour  Intake   1740 ml  Output   4050 ml  Net  -2310 ml   Filed Weights   05/08/14 1859 05/09/14 0457 05/11/14 0522  Weight: 102.195 kg (225 lb 4.8 oz) 99.701 kg (219 lb 12.8 oz) 98.2 kg (216 lb 7.9 oz)    Exam:   General: Lesion male in no acute distress  HEENT: Pallor present, moist mucosa  Chest: Clear breath sounds bilaterally, left Port-A-Cath  CVS: Normal S1-S2, no murmurs  Abdomen: Soft, nondistended, nontender, ileostomy status, left percutaneous nephrostomy draining bloody urine, Foley with blood mixed urine  Extremities: Palpable purpura over the dorsum of bilateral feet, trace edema bilaterally  CNS: Alert and oriented  Data Reviewed: Basic Metabolic Panel:  Recent Labs Lab 05/08/14 0550 05/08/14 1900 05/09/14 0100 05/10/14 0505 05/11/14 0835  NA 138 139 139 141 138  K 3.9 4.0 4.7 3.7 3.5*  CL 109 109 110 110 109  CO2 22 21 21  21 18*  GLUCOSE 77 111* 139* 91 115*  BUN 25* 23 24* 27* 19  CREATININE 1.95* 1.87* 1.84* 2.11* 1.83*  CALCIUM 8.2* 8.1* 8.1* 8.2* 8.0*   Liver Function Tests:  Recent Labs Lab 05/10/14 0505  AST 30  ALT 46  ALKPHOS 104  BILITOT 1.2  PROT 5.1*  ALBUMIN 2.2*   No results for input(s): LIPASE, AMYLASE in the last 168 hours. No results for input(s): AMMONIA in the last 168 hours. CBC:  Recent Labs Lab 05/08/14 1900 05/09/14 0100 05/09/14 0700 05/10/14 0505 05/11/14 0835  WBC 4.4 3.6* 3.5* 7.3 2.9*  HGB  7.4* 7.1* 7.1* 9.0* 9.1*  HCT 22.3* 21.7* 21.7* 27.7* 28.1*  MCV 93.3 93.1 93.9 90.5 91.5  PLT 53* 49* 44* 54* 41*   Cardiac Enzymes: No results for input(s): CKTOTAL, CKMB, CKMBINDEX, TROPONINI in the last 168 hours. BNP (last 3 results) No results for input(s): PROBNP in the last 8760 hours. CBG: No results for input(s): GLUCAP in the last 168 hours.  Recent Results (from the past 240 hour(s))  Body fluid culture     Status: None   Collection Time: 05/03/14 10:03 AM  Result Value Ref Range Status   Specimen Description FLUID LEFT KIDNEY  Final   Special Requests NONE  Final   Gram Stain   Final    ABUNDANT WBC PRESENT, PREDOMINANTLY PMN MODERATE GRAM POSITIVE COCCI IN PAIRS AND CHAINS Gram Stain Report Called to,Read Back By and Verified With: Gram Stain Report Called to,Read Back By and Verified With: Stefani Dama RN on 05/03/14 at 21:20 by Rise Mu Performed at [facility]    Culture   Final    ABUNDANT ENTEROCOCCUS SPECIES Performed at [facility]    Report Status 05/05/2014 FINAL  Final   Organism ID, Bacteria ENTEROCOCCUS SPECIES  Final      Susceptibility   Enterococcus species - MIC*    VANCOMYCIN 2 SENSITIVE Sensitive     AMPICILLIN <=2 SENSITIVE Sensitive     * ABUNDANT ENTEROCOCCUS SPECIES  Culture, blood (routine x 2)     Status: None   Collection Time: 05/03/14  7:26 PM  Result Value Ref Range Status   Specimen Description BLOOD LEFT ARM  Final   Special Requests BOTTLES DRAWN AEROBIC AND ANAEROBIC 5CC EACH  Final   Culture  Setup Time   Final    05/04/2014 03:55 Performed at [facility]   Final    NO GROWTH 5 DAYS Performed at [facility]    Report Status 05/10/2014 FINAL  Final  Culture, blood (routine x 2)     Status: None   Collection Time: 05/03/14  7:50 PM  Result Value Ref Range Status   Specimen Description BLOOD RIGHT ANTECUBITAL  Final   Special Requests BOTTLES DRAWN AEROBIC AND ANAEROBIC  10CC EACH  Final   Culture  Setup Time   Final    05/04/2014 03:55 Performed at [place]   Final    NO GROWTH 5 DAYS Performed at [facility]    Report Status 05/10/2014 FINAL  Final  Urine culture     Status: None   Collection Time: 05/03/14  8:29 PM  Result Value Ref Range Status   Specimen Description URINE, RANDOM  Final   Special Requests ADDED 1940 05/04/14  Final   Culture  Setup Time   Final    05/04/2014 20:32 Performed at [facility]    Colony Count NO GROWTH Performed at [facility]   Final   Culture NO GROWTH Performed at [facility]   Final   Report Status 05/05/2014 FINAL  Final  MRSA PCR Screening     Status: None   Collection Time: 05/04/14 10:39 AM  Result Value Ref Range Status   MRSA by PCR NEGATIVE NEGATIVE Final    Comment:        The GeneXpert MRSA Assay (FDA approved for NASAL specimens only), is one component of a comprehensive MRSA colonization surveillance program. It is not intended to diagnose MRSA infection nor to guide or monitor treatment for MRSA infections.      Studies: No results found.  Scheduled Meds: . amoxicillin  250 mg Oral Q12H  . cyanocobalamin  1,000 mcg Intramuscular Q30 days  . [START ON 05/12/2014] ferumoxytol  1,020 mg Intravenous Once  . folic acid  1 mg Oral Daily  . hydrocortisone cream  1 application Topical BID  . pantoprazole  40 mg Oral Daily  . phytonadione  10 mg Subcutaneous Daily  . sodium chloride  3 mL Intravenous Q12H   Continuous Infusions:      Time spent: 25 minutes    [facility]  [facility] Pager [phone]  If 7PM-7AM, please contact night-coverage at [email or web address], password [facility] 05/11/2014, 1:59 PM  LOS: 13 days

## 2014-05-11 NOTE — Anesthesia Postprocedure Evaluation (Signed)
Anesthesia Post Note  Patient: [name]  Procedure(s) Performed: Procedure(s) (LRB): CYSTOSCOPY/ EVACUATION OF CLOTS  WITH LEFT RETROGRADE PYELOGRAM,LEFT URETEROSCOPY  (Left)  Anesthesia type: General  Patient location: PACU  Post pain: Pain level controlled  Post assessment: Post-op Vital signs reviewed  Last Vitals: BP 116/58 mmHg  Pulse 72  Temp(Src) 36.8 C (Oral)  Resp 15  Ht 5\' 10"  (1.778 m)  Wt 216 lb 7.9 oz (98.2 kg)  BMI 31.06 kg/m2  SpO2 100%  Post vital signs: Reviewed  Level of consciousness: sedated  Complications: No apparent anesthesia complications

## 2014-05-11 NOTE — Progress Notes (Signed)
Pt unable to void, 6 hrs past removing foley, bladder scaned 211 cc in bladder.

## 2014-05-11 NOTE — Progress Notes (Signed)
The patient is now over [facility]. He will be undergoing a surgical procedure on Tuesday.  His bleeding has gotten better. He was transfused with a couple units of blood couple days ago. I think that he is iron deficient. I would go ahead and give him iron.  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 He probably will need ESA administration. However, this can be done as an outpatient.  He is not complaining of any pain.  He still has moderate renal insufficiency. Again, I suspect that he has a low erythropoietin level and ultimately will need ESA to be given.  I think the goal now is to make sure he gets through surgery without much in way bleeding.  His platelet count is 54,000. I think that 1 unit of platelets would be appropriate for him. I think this will minimize his risk of bleeding.  I would have the platelets going on him down in the preop area.  His liver has some cirrhosis. His INR when last checked was a little on the high side. He probably will benefit from some vitamin K to try to help minimize bleeding risk.  We will follow along closely to try to provide help to minimize complications from his surgery.  [name] E.  Joshua [name]

## 2014-05-12 ENCOUNTER — Ambulatory Visit (HOSPITAL_COMMUNITY): Payer: Managed Care, Other (non HMO)

## 2014-05-12 ENCOUNTER — Inpatient Hospital Stay (HOSPITAL_COMMUNITY): Payer: Managed Care, Other (non HMO)

## 2014-05-12 ENCOUNTER — Ambulatory Visit (HOSPITAL_COMMUNITY): Payer: Managed Care, Other (non HMO) | Admitting: Oncology

## 2014-05-12 LAB — CBC
HCT: 26.7 % — ABNORMAL LOW (ref 39.0–52.0)
HEMOGLOBIN: 8.5 g/dL — AB (ref 13.0–17.0)
MCH: 29.2 pg (ref 26.0–34.0)
MCHC: 31.8 g/dL (ref 30.0–36.0)
MCV: 91.8 fL (ref 78.0–100.0)
Platelets: 33 10*3/uL — ABNORMAL LOW (ref 150–400)
RBC: 2.91 MIL/uL — AB (ref 4.22–5.81)
RDW: 20.9 % — ABNORMAL HIGH (ref 11.5–15.5)
WBC: 2.7 10*3/uL — ABNORMAL LOW (ref 4.0–10.5)

## 2014-05-12 LAB — COMPREHENSIVE METABOLIC PANEL
ALK PHOS: 111 U/L (ref 39–117)
ALT: 53 U/L (ref 0–53)
AST: 42 U/L — ABNORMAL HIGH (ref 0–37)
Albumin: 2.1 g/dL — ABNORMAL LOW (ref 3.5–5.2)
Anion gap: 8 (ref 5–15)
BUN: 15 mg/dL (ref 6–23)
CO2: 19 mEq/L (ref 19–32)
Calcium: 8.1 mg/dL — ABNORMAL LOW (ref 8.4–10.5)
Chloride: 110 mEq/L (ref 96–112)
Creatinine, Ser: 1.86 mg/dL — ABNORMAL HIGH (ref 0.50–1.35)
GFR calc non Af Amer: 37 mL/min — ABNORMAL LOW (ref >90)
GFR, EST AFRICAN AMERICAN: 43 mL/min — AB (ref >90)
GLUCOSE: 79 mg/dL (ref 70–99)
POTASSIUM: 3.5 meq/L — AB (ref 3.7–5.3)
Sodium: 137 mEq/L (ref 137–147)
TOTAL PROTEIN: 5 g/dL — AB (ref 6.0–8.3)
Total Bilirubin: 0.8 mg/dL (ref 0.3–1.2)

## 2014-05-12 LAB — PROTIME-INR
INR: 1.1 (ref 0.00–1.49)
Prothrombin Time: 14.3 seconds (ref 11.6–15.2)

## 2014-05-12 LAB — APTT: aPTT: 25 seconds (ref 24–37)

## 2014-05-12 MED ORDER — SODIUM CHLORIDE 0.9 % IV SOLN
Freq: Once | INTRAVENOUS | Status: AC
  Administered 2014-05-13: 05:00:00 via INTRAVENOUS

## 2014-05-12 MED ORDER — SODIUM CHLORIDE 0.9 % IV SOLN: Freq: Once | INTRAVENOUS | Status: DC

## 2014-05-12 MED ORDER — DIPHENHYDRAMINE HCL 25 MG PO CAPS
25.0000 mg | ORAL_CAPSULE | Freq: Once | ORAL | Status: AC
  Administered 2014-05-13: 25 mg via ORAL
  Filled 2014-05-12: qty 1

## 2014-05-12 MED ORDER — ACETAMINOPHEN 325 MG PO TABS
650.0000 mg | ORAL_TABLET | Freq: Once | ORAL | Status: AC
  Administered 2014-05-13: 650 mg via ORAL
  Filled 2014-05-12: qty 2

## 2014-05-12 NOTE — Anesthesia Preprocedure Evaluation (Addendum)
Anesthesia Evaluation  Patient identified by MRN, date of birth, ID band Patient awake    Reviewed: Allergy & Precautions, H&P , NPO status , Patient's Chart, lab work & pertinent test results  History of Anesthesia Complications Negative for: history of anesthetic complications  Airway Mallampati: III  TM Distance: >3 FB Neck ROM: Full    Dental no notable dental hx. (+) Poor Dentition, Dental Advisory Given, Chipped, Missing   Pulmonary sleep apnea , former smoker,  Mets to lung breath sounds clear to auscultation  Pulmonary exam normal       Cardiovascular + CAD, + Past MI and + Cardiac Stents (stent in 2003, patient reports that he has been off anticoagulation for years, currently recieving prbcs and plt transfusions) Rhythm:Regular Rate:Normal     Neuro/Psych negative neurological ROS  negative psych ROS   GI/Hepatic negative GI ROS, (+) Cirrhosis -      , Colon cancer    Endo/Other  diabetes  Renal/GU ARFRenal disease  negative genitourinary   Musculoskeletal negative musculoskeletal ROS (+)   Abdominal   Peds negative pediatric ROS (+)  Hematology  (+) anemia , thrombocytopenia   Anesthesia Other Findings   Reproductive/Obstetrics negative OB ROS                            Anesthesia Physical Anesthesia Plan  ASA: IV  Anesthesia Plan: General   Post-op Pain Management:    Induction: Intravenous  Airway Management Planned: Oral ETT  Additional Equipment:   Intra-op Plan:   Post-operative Plan: Extubation in OR  Informed Consent: I have reviewed the patients History and Physical, chart, labs and discussed the procedure including the risks, benefits and alternatives for the proposed anesthesia with the patient or authorized representative who has indicated his/her understanding and acceptance.   Dental advisory given  Plan Discussed with: CRNA  Anesthesia Plan  Comments:         Anesthesia Quick Evaluation

## 2014-05-12 NOTE — Progress Notes (Signed)
Patient had not voided since foley was removed.  Notified NP on call and order received to I&O cath.  Performed I&O cath at 22:34 and got a result of 275 cc of dark [name] urine.

## 2014-05-12 NOTE — Progress Notes (Signed)
Patient ID: [name], male   DOB: [birth year], 63 y.o.   MRN: [id number] 4 Days Post-Op  Subjective: Mr. Mannor has kept clear urine through the week but he hasn't been able to void and required CIC last night.  His bladder scan this AM showed 314ml but he was only able to void about 32ml.  He is on for the left PCNL tomorrow at 730am.  His platelet count is back down to 33 and he will need a platelet transfusion prior to the procedure.   He is without major complaints and walked a lot yesterday.  He has some leg edema today.  ROS:  Review of Systems  Constitutional: Negative for fever and chills.  Respiratory: Negative for shortness of breath.   Cardiovascular: Positive for leg swelling. Negative for chest pain.    Anti-infectives: Anti-infectives    Start     Dose/Rate Route Frequency Ordered Stop   05/08/14 1645  Ampicillin-Sulbactam (UNASYN) 3 g in sodium chloride 0.9 % 100 mL IVPB     3 g100 mL/hr over 60 Minutes Intravenous To Surgery 05/08/14 1631 05/08/14 1656   05/05/14 1300  amoxicillin (AMOXIL) capsule 250 mg     250 mg Oral Every 12 hours 05/05/14 1227     05/05/14 1215  amoxicillin (AMOXIL) chewable tablet 250 mg  Status:  Discontinued     250 mg Oral Every 12 hours 05/05/14 1214 05/05/14 1227   05/03/14 2200  vancomycin (VANCOCIN) IVPB 1000 mg/200 mL premix  Status:  Discontinued     1,000 mg200 mL/hr over 60 Minutes Intravenous Every 24 hours 05/03/14 2151 05/05/14 1214   05/03/14 1200  ceFAZolin (ANCEF) IVPB 2 g/50 mL premix     2 g100 mL/hr over 30 Minutes Intravenous On call 05/02/14 1254 05/03/14 1011   05/03/14 0914  ceFAZolin (ANCEF) 2-3 GM-% IVPB SOLR    Comments:  Shaaron Adler   : cabinet override      05/03/14 0914 05/03/14 2114      Current Facility-Administered Medications  Medication Dose Route Frequency Provider Last Rate Last Dose  . acetaminophen (TYLENOL) tablet 650 mg  650 mg Oral Q6H PRN Nishant Dhungel, MD   650 mg at 05/03/14 1810  .  amoxicillin (AMOXIL) capsule 250 mg  250 mg Oral Q12H Nishant Dhungel, MD   250 mg at 05/11/14 2112  . cyanocobalamin ((VITAMIN B-12)) injection 1,000 mcg  1,000 mcg Intramuscular Q30 days Shanda Howells, MD   1,000 mcg at 04/29/14 0947  . ferumoxytol (FERAHEME) 1,020 mg in sodium chloride 0.9 % 100 mL IVPB  1,020 mg Intravenous Once Volanda Napoleon, MD      . folic acid (FOLVITE) tablet 1 mg  1 mg Oral Daily Shanda Howells, MD   1 mg at 05/11/14 0906  . HYDROcodone-acetaminophen (NORCO/VICODIN) 5-325 MG per tablet 1-2 tablet  1-2 tablet Oral Q6H PRN Shanda Howells, MD   2 tablet at 05/11/14 2219  . hydrocortisone cream 1 % 1 application  1 application Topical BID Shanda Howells, MD   1 application at 56/38/75 2112  . hydrocortisone cream 1 %   Topical TID PRN Carmin Muskrat, MD      . HYDROmorphone (DILAUDID) injection 0.25-0.5 mg  0.25-0.5 mg Intravenous Q5 min PRN Nickie Retort, MD      . HYDROmorphone (DILAUDID) injection 2 mg  2 mg Intravenous Q4H PRN Nishant Dhungel, MD   2 mg at 05/11/14 0308  . meperidine (DEMEROL) injection 6.25-12.5 mg  6.25-12.5 mg  Intravenous Q5 min PRN Nickie Retort, MD      . nystatin (MYCOSTATIN/NYSTOP) topical powder   Topical BID PRN Nishant Dhungel, MD      . ondansetron (ZOFRAN) tablet 8 mg  8 mg Oral BID PRN Shanda Howells, MD      . pantoprazole (PROTONIX) EC tablet 40 mg  40 mg Oral Daily Shanda Howells, MD   40 mg at 05/11/14 0906  . phytonadione (VITAMIN K) SQ injection 10 mg  10 mg Subcutaneous Daily Volanda Napoleon, MD   10 mg at 05/11/14 0906  . promethazine (PHENERGAN) injection 6.25-12.5 mg  6.25-12.5 mg Intravenous Q15 min PRN Nickie Retort, MD      . sodium chloride 0.9 % injection 10-40 mL  10-40 mL Intracatheter PRN Nishant Dhungel, MD   20 mL at 05/08/14 0550  . sodium chloride 0.9 % injection 10-40 mL  10-40 mL Intracatheter PRN Malka So, MD   20 mL at 05/11/14 1430  . sodium chloride 0.9 % injection 3 mL  3 mL Intravenous Q12H Shanda Howells, MD   3 mL at 05/09/14 1016  . zolpidem (AMBIEN) tablet 5 mg  5 mg Oral QHS PRN Eugenie Filler, MD   5 mg at 05/12/14 0032   Facility-Administered Medications Ordered in Other Encounters  Medication Dose Route Frequency Provider Last Rate Last Dose  . heparin lock flush 100 unit/mL  500 Units Intravenous Once Pieter Partridge, MD      . sodium chloride 0.9 % injection 10 mL  10 mL Intravenous PRN Pieter Partridge, MD   10 mL at 07/17/12 1113     Objective: Vital signs in last 24 hours: Temp:  [97.1 F (36.2 C)-98.2 F (36.8 C)] 97.1 F (36.2 C) (12/14 0508) Pulse Rate:  [71-77] 71 (12/14 0508) Resp:  [15-20] 16 (12/14 0508) BP: (111-116)/(56-59) 111/56 mmHg (12/14 0508) SpO2:  [100 %] 100 % (12/14 0508) Weight:  [102.83 kg (226 lb 11.2 oz)] 102.83 kg (226 lb 11.2 oz) (12/14 0508)  Intake/Output from previous day: 12/13 0701 - 12/14 0700 In: 1080 [P.O.:1080] Out: 3120 [Urine:1245; FTDDU:2025] Intake/Output this shift:     Physical Exam  Constitutional: He is well-developed, well-nourished, and in no distress.  Cardiovascular: Normal rate and regular rhythm.   Pulmonary/Chest: Effort normal and breath sounds normal. No respiratory distress.  Abdominal: Soft. Bowel sounds are normal. He exhibits no distension. There is no tenderness.  Musculoskeletal: Normal range of motion. He exhibits edema (moderate bilateral leg edema).    Lab Results:   Recent Labs  05/11/14 0835 05/12/14 0310  WBC 2.9* 2.7*  HGB 9.1* 8.5*  HCT 28.1* 26.7*  PLT 41* 33*   BMET  Recent Labs  05/11/14 0835 05/12/14 0310  NA 138 137  K 3.5* 3.5*  CL 109 110  CO2 18* 19  GLUCOSE 115* 79  BUN 19 15  CREATININE 1.83* 1.86*  CALCIUM 8.0* 8.1*   PT/INR  Recent Labs  05/12/14 0310  LABPROT 14.3  INR 1.10   ABG No results for input(s): PHART, HCO3 in the last 72 hours.  Invalid input(s): PCO2, PO2  Studies/Results: No results found.   Assessment: Urinary retention  post foley removal.   He had retention prior to admission that was present for several days and his bladder probably has a stretch injury that is preventing voiding.  This can sometimes take 2-3 months to resolve.  He has left renal and proximal ureteral stones.  Johney Maine  hematuria had resolved post cystoscopy but by reports his urine was dark brown on cath last night.  Thrombocytopenia and anemia.   His platelet count has drifted back down to 33 and his Hgb is 8.5.   Acute on Chronic renal insufficiency.  His Cr is down to 1.85 which is near his baseline.    Plan: If he is unable to void this morning, he will need a 110fr foley replaced and left for probably at least a month before he has another voiding trial.   He will need another platelet transfusion prior to his PCNL which is scheduled for 730am tomorrow.  He should have blood on hand as well.    I have reviewed the risks of the PCNL again with the patient.   This will be a first stage procedure as there is a possibility that he will need a second look.       LOS: 14 days    Kavonte Bearse J 05/12/2014

## 2014-05-12 NOTE — Progress Notes (Signed)
[facility] PROGRESS NOTE  [name] IDP:[id number] DOB: 18-Nov-1950 DOA: 04/28/2014 PCP: [name], MD  Brief narrative 63 year old male with history of metastatic colon cancer receiving chemotherapy in [place], anemia, coronary artery disease, history of hydronephrosis status post stenting in April 2015, nephrolithiasis, chronic kidney disease stage III who was sent to the ED from his hematology office. Patient reports intermittent hematuria for of about 2-3 weeks duration. Few days back he thought he passed his ureteral stent while using the bathroom. When he presented to his oncologist for chemotherapy he was found to have progressively worsened renal function. His last normal creatinine was 1.67 about one month back which worsened to 7.1 on 11/16. He was followed up again on 11/23 by his oncologist and his creatinine had worsened to 12.47. A renal ultrasound was done which showed moderate bilateral hydronephrosis with nephrocalcinosis. On 11/30 he was found to have bilateral lower extremity purpura and increased leg swellings. Blood work done showed platelets of 52,000 and creatinine of 18.4. He was also found to be in metabolic acidosis with CO2 9 with high anion gap.  A CT scan of the abdomen done in 11/30 showed increasing moderate to severe right hydronephrosis and severe left hydronephrosis. Also showed a 0.9x0.2 cm calculus at the left UPJ and unchanged multiple bilateral renal calculi  Urology was consulted and recommended admission to [facility] initially but [facility] was [facility] for [facility].   Patient continues to have hematuria, despite the irrigation. Plan for transferring the patient to [place] for cystoscopy with ureteroscopy for evaluation of the bleeding.     Assessment/Plan: Acute on chronic kidney disease stage III secondary to obstructive uropathy with severe metabolic acidosis  -AG Metabolic acidosis resolved with improvement in  renal fn and bicarb drip . Renal fn improving after foley placed on 11/30. -bedside cystoscopy performed by Dr Jeffie Pollock on 12/3 showing a large amount of old and fresh clot that appears to have come from the left collecting system. There was a clot protruding from the ureteral orifice on the left which was left as is. S/p evacuation of the bladder clots with a 6fr Ainsworth foley. His prostate was short and minimally obstructing and the bladder wall had no evidence of cancer or stones. Foley was removed but had to be placed again on 12/4 due to urinary retention. -most likely source of bleeding are the left ureteral and renal stones,. IR placed rt sided PCN. Still having hematuria. reportedly has blood clots with difficulty flushing out. Continued bladder irrigation. Despite irrigation, he continued to have hematuria and required another unit of prbc transfusion on 12/9. He was transferred to [facility] long for cystoscopy with ureteroscopy to better assess the source of bleeding  Status post cystoscopy with evacuation of clots 12/10, left ureterocopy,  CBI  discontinued 12/11, Foley discontinued yesterday but replaced today and voiding trial in a month  Creatinine slowly improving     Severe Anemia and thrombocytopenia Secondary to active hematuria and blood clots. Also contributed by chemotherapy and metastatic colon cancer. Thrombocytopenic worsening today Transfused 2 units of packed red blood cells again on 12/11  Patient has been receiving both PRBC and platelets. He has ongoing thrombocytopenia possibly related to chemotherapy and liver cirrhosis and improving minimally with transfusion.. Patient has received 11 u PRBC since this  admission .   6 units PRBC prior to this admission. -Received IV dextran infusion on 12/7.  Repeat CBC 12/14 Seen by Dr. Charlesetta Garibaldi hematology on 12/6.  transfuse platelet prior to procedure  Will need platelets before procedure on Tuesday 12/15 Vit K IM  times 3 doses , cont folic acid , repeat INR in am  Platelet transfusion ordered for tomorrow Type and screen today     Sepsis with enterococcus bacteriuria Patient febrile and hypotensive on the evening percutaneous nephrostomy was done (12/5). Urine culture from the percutaneous site growing enterococcus. Currently on amoxicillin Blood culture with no growth to date. Spoke with ID consult Dr. Megan Salon and recommend switching to oral amoxicillin and to continue antibiotic until he has his for lithotripsy done to avoid further sepsis. Since lithotripsy is planned for next week we will treat him for a 2 weeks course. 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 Appreciate recommendations. Follows with oncology in [place].  Palpable purpura Noted HSP/ plantar erythrodysthesia. Reports to be due to chemotherapy Apply topical cream  Protein calorie malnutrition, severe Nutrition consult  B/l leg edema Prn lasix  Mild hypokalemia  Will be repleted as needed. Repeat bmp ordered showed resolution of hypokalemia    DVT prophylaxis: SCD  -Code Status: full code family Communication: none at bedside Disposition Plan: Patient remains inpatient   If bleeding resolves and his H&H and platelets remain stable, he can be discharged home in the next 48 hours and be readmitted to [facility] long hospital on 12/14 for percutaneous nephrolithotomy. It is possible patient may remain inpatient until then due to ongoing hematuria with anemia and thrombocytopenia.   Consultants:  Dr Jeffie Pollock  Renal ( signed off)  IR  hematology  Procedures:  Cystoscopy on 12/3  left percutaneous nephrostomy on 12/5 12/11:  1. Cystoscopy with evacuation of clots.             2. Left retrograde pyelogram with interpretation.             3. Left  ureteroscopy.  Antibiotics:  IV vanco 12/5--12/7  Oral  Amoxicillin 12/7-- until 12/21  Subjective Patient reports that he feels well. Had urinary retention last night and Foley was replaced, he has a tinge of pink colored urine today  Objective: Filed Vitals:   05/12/14 1037  BP: 116/57  Pulse: 66  Temp: 98 F (36.7 C)  Resp: 18    Intake/Output Summary (Last 24 hours) at 05/12/14 1344 Last data filed at 05/12/14 1100  Gross per 24 hour  Intake   1080 ml  Output   3105 ml  Net  -2025 ml   Filed Weights   05/09/14 0457 05/11/14 0522 05/12/14 0508  Weight: 99.701 kg (219 lb 12.8 oz) 98.2 kg (216 lb 7.9 oz) 102.83 kg (226 lb 11.2 oz)    Exam:   General: Lesion male in no acute distress  HEENT: Pallor present, moist mucosa  Chest: Clear breath sounds bilaterally, left Port-A-Cath  CVS: Normal S1-S2, no murmurs  Abdomen: Soft, nondistended, nontender, ileostomy status, left percutaneous nephrostomy draining bloody urine, Foley with blood mixed urine  Extremities: Palpable purpura over the dorsum of bilateral feet, trace edema bilaterally  CNS: Alert and oriented  Data Reviewed: Basic Metabolic Panel:  Recent Labs Lab 05/08/14 1900 05/09/14 0100 05/10/14 0505 05/11/14 0835 05/12/14 0310  NA 139 139 141 138 137  K 4.0 4.7 3.7 3.5* 3.5*  CL 109 110 110 109 110  CO2 21 21 21  18* 19  GLUCOSE 111* 139* 91 115* 79  BUN 23 24*  27* 19 15  CREATININE 1.87* 1.84* 2.11* 1.83* 1.86*  CALCIUM 8.1* 8.1* 8.2* 8.0* 8.1*   Liver Function Tests:  Recent Labs Lab 05/10/14 0505 05/12/14 0310  AST 30 42*  ALT 46 53  ALKPHOS 104 111  BILITOT 1.2 0.8  PROT 5.1* 5.0*  ALBUMIN 2.2* 2.1*   No results for input(s): LIPASE, AMYLASE in the last 168 hours. No results for input(s): AMMONIA in the last 168 hours. CBC:  Recent Labs Lab 05/09/14 0100 05/09/14 0700 05/10/14 0505 05/11/14 0835 05/12/14 0310  WBC 3.6* 3.5* 7.3 2.9* 2.7*  HGB 7.1* 7.1* 9.0* 9.1*  8.5*  HCT 21.7* 21.7* 27.7* 28.1* 26.7*  MCV 93.1 93.9 90.5 91.5 91.8  PLT 49* 44* 54* 41* 33*   Cardiac Enzymes: No results for input(s): CKTOTAL, CKMB, CKMBINDEX, TROPONINI in the last 168 hours. BNP (last 3 results) No results for input(s): PROBNP in the last 8760 hours. CBG: No results for input(s): GLUCAP in the last 168 hours.  Recent Results (from the past 240 hour(s))  Body fluid culture     Status: None   Collection Time: 05/03/14 10:03 AM  Result Value Ref Range Status   Specimen Description FLUID LEFT KIDNEY  Final   Special Requests NONE  Final   Gram Stain   Final    ABUNDANT WBC PRESENT, PREDOMINANTLY PMN MODERATE GRAM POSITIVE COCCI IN PAIRS AND CHAINS Gram Stain Report Called to,Read Back By and Verified With: Gram Stain Report Called to,Read Back By and Verified With: Stefani Dama RN on 05/03/14 at 21:20 by Rise Mu Performed at [facility]    Culture   Final    ABUNDANT ENTEROCOCCUS SPECIES Performed at [facility]    Report Status 05/05/2014 FINAL  Final   Organism ID, Bacteria ENTEROCOCCUS SPECIES  Final      Susceptibility   Enterococcus species - MIC*    VANCOMYCIN 2 SENSITIVE Sensitive     AMPICILLIN <=2 SENSITIVE Sensitive     * ABUNDANT ENTEROCOCCUS SPECIES  Culture, blood (routine x 2)     Status: None   Collection Time: 05/03/14  7:26 PM  Result Value Ref Range Status   Specimen Description BLOOD LEFT ARM  Final   Special Requests BOTTLES DRAWN AEROBIC AND ANAEROBIC 5CC EACH  Final   Culture  Setup Time   Final    05/04/2014 03:55 Performed at [place]   Final    NO GROWTH 5 DAYS Performed at [facility]    Report Status 05/10/2014 FINAL  Final  Culture, blood (routine x 2)     Status: None   Collection Time: 05/03/14  7:50 PM  Result Value Ref Range Status   Specimen Description BLOOD RIGHT ANTECUBITAL  Final   Special Requests BOTTLES DRAWN AEROBIC AND ANAEROBIC 10CC EACH  Final    Culture  Setup Time   Final    05/04/2014 03:55 Performed at [place]   Final    NO GROWTH 5 DAYS Performed at [facility]    Report Status 05/10/2014 FINAL  Final  Urine culture     Status: None   Collection Time: 05/03/14  8:29 PM  Result Value Ref Range Status   Specimen Description URINE, RANDOM  Final   Special Requests ADDED 1940 05/04/14  Final   Culture  Setup Time   Final    05/04/2014 20:32 Performed at [facility]  Count NO GROWTH Performed at [facility]   Final   Culture NO GROWTH Performed at [facility]   Final   Report Status 05/05/2014 FINAL  Final  MRSA PCR Screening     Status: None   Collection Time: 05/04/14 10:39 AM  Result Value Ref Range Status   MRSA by PCR NEGATIVE NEGATIVE Final    Comment:        The GeneXpert MRSA Assay (FDA approved for NASAL specimens only), is one component of a comprehensive MRSA colonization surveillance program. It is not intended to diagnose MRSA infection nor to guide or monitor treatment for MRSA infections.      Studies: No results found.  Scheduled Meds: . sodium chloride   Intravenous Once  . amoxicillin  250 mg Oral Q12H  . cyanocobalamin  1,000 mcg Intramuscular Q30 days  . folic acid  1 mg Oral Daily  . hydrocortisone cream  1 application Topical BID  . pantoprazole  40 mg Oral Daily  . phytonadione  10 mg Subcutaneous Daily  . sodium chloride  3 mL Intravenous Q12H   Continuous Infusions:      Time spent: 25 minutes    [facility]  [facility] Pager [phone]  If 7PM-7AM, please contact night-coverage at [email or web address], password [facility] 05/12/2014, 1:44 PM  LOS: 14 days

## 2014-05-13 ENCOUNTER — Encounter (HOSPITAL_COMMUNITY)
Admission: EM | Disposition: A | Discharge status: Home / Self Care | Payer: Self-pay | Source: Home / Self Care | Attending: Internal Medicine

## 2014-05-13 ENCOUNTER — Inpatient Hospital Stay (HOSPITAL_COMMUNITY): Payer: Managed Care, Other (non HMO) | Admitting: Anesthesiology

## 2014-05-13 ENCOUNTER — Inpatient Hospital Stay (HOSPITAL_COMMUNITY): Payer: Managed Care, Other (non HMO)

## 2014-05-13 ENCOUNTER — Encounter (HOSPITAL_COMMUNITY): Payer: Self-pay | Admitting: *Deleted

## 2014-05-13 HISTORY — PX: HOLMIUM LASER APPLICATION: SHX5852

## 2014-05-13 HISTORY — PX: NEPHROLITHOTOMY: SHX5134

## 2014-05-13 LAB — CBC
HCT: 28.2 % — ABNORMAL LOW (ref 39.0–52.0)
HEMATOCRIT: 29.9 % — AB (ref 39.0–52.0)
HEMOGLOBIN: 9.9 g/dL — AB (ref 13.0–17.0)
HEMOGLOBIN: 9 g/dL — AB (ref 13.0–17.0)
MCH: 30.8 pg (ref 26.0–34.0)
MCH: 30 pg (ref 26.0–34.0)
MCHC: 33.1 g/dL (ref 30.0–36.0)
MCHC: 31.9 g/dL (ref 30.0–36.0)
MCV: 93.1 fL (ref 78.0–100.0)
MCV: 94 fL (ref 78.0–100.0)
Platelets: 40 10*3/uL — ABNORMAL LOW (ref 150–400)
Platelets: 39 10*3/uL — ABNORMAL LOW (ref 150–400)
RBC: 3.21 MIL/uL — ABNORMAL LOW (ref 4.22–5.81)
RBC: 3 MIL/uL — AB (ref 4.22–5.81)
RDW: 21.1 % — ABNORMAL HIGH (ref 11.5–15.5)
RDW: 20.9 % — ABNORMAL HIGH (ref 11.5–15.5)
WBC: 3.4 10*3/uL — AB (ref 4.0–10.5)
WBC: 2.8 10*3/uL — AB (ref 4.0–10.5)

## 2014-05-13 LAB — GLUCOSE, CAPILLARY
GLUCOSE-CAPILLARY: 90 mg/dL (ref 70–99)
GLUCOSE-CAPILLARY: 91 mg/dL (ref 70–99)
Glucose-Capillary: 66 mg/dL — ABNORMAL LOW (ref 70–99)

## 2014-05-13 LAB — PROTIME-INR
INR: 1.06 (ref 0.00–1.49)
PROTHROMBIN TIME: 13.9 s (ref 11.6–15.2)

## 2014-05-13 LAB — MRSA PCR SCREENING: MRSA BY PCR: NEGATIVE

## 2014-05-13 SURGERY — NEPHROLITHOTOMY PERCUTANEOUS
Anesthesia: General | Laterality: Left

## 2014-05-13 MED ORDER — METOCLOPRAMIDE HCL 5 MG/ML IJ SOLN
INTRAMUSCULAR | Status: AC
  Filled 2014-05-13: qty 2

## 2014-05-13 MED ORDER — LACTATED RINGERS IV SOLN
INTRAVENOUS | Status: DC | PRN
  Administered 2014-05-13 (×2): via INTRAVENOUS

## 2014-05-13 MED ORDER — LACTATED RINGERS IV SOLN: INTRAVENOUS | Status: DC

## 2014-05-13 MED ORDER — PHENYLEPHRINE HCL 10 MG/ML IJ SOLN
INTRAMUSCULAR | Status: DC | PRN
  Administered 2014-05-13: 80 ug via INTRAVENOUS

## 2014-05-13 MED ORDER — FENTANYL CITRATE 0.05 MG/ML IJ SOLN: 25.0000 ug | INTRAMUSCULAR | Status: DC | PRN

## 2014-05-13 MED ORDER — SODIUM CHLORIDE 0.9 % IR SOLN
Status: DC | PRN
  Administered 2014-05-13: 20000 mL

## 2014-05-13 MED ORDER — ONDANSETRON HCL 4 MG/2ML IJ SOLN
INTRAMUSCULAR | Status: AC
  Filled 2014-05-13: qty 2

## 2014-05-13 MED ORDER — DEXTROSE 50 % IV SOLN
INTRAVENOUS | Status: AC
  Filled 2014-05-13: qty 50

## 2014-05-13 MED ORDER — IOHEXOL 300 MG/ML  SOLN
INTRAMUSCULAR | Status: DC | PRN
  Administered 2014-05-13: 15 mL

## 2014-05-13 MED ORDER — CISATRACURIUM BESYLATE (PF) 10 MG/5ML IV SOLN
INTRAVENOUS | Status: DC | PRN
  Administered 2014-05-13: 6 mg via INTRAVENOUS
  Administered 2014-05-13 (×2): 2 mg via INTRAVENOUS

## 2014-05-13 MED ORDER — PROPOFOL 10 MG/ML IV BOLUS
INTRAVENOUS | Status: DC | PRN
  Administered 2014-05-13: 130 mg via INTRAVENOUS

## 2014-05-13 MED ORDER — NEOSTIGMINE METHYLSULFATE 10 MG/10ML IV SOLN
INTRAVENOUS | Status: AC
  Filled 2014-05-13: qty 1

## 2014-05-13 MED ORDER — DEXAMETHASONE SODIUM PHOSPHATE 10 MG/ML IJ SOLN
INTRAMUSCULAR | Status: AC
  Filled 2014-05-13: qty 1

## 2014-05-13 MED ORDER — METOCLOPRAMIDE HCL 5 MG/ML IJ SOLN
INTRAMUSCULAR | Status: DC | PRN
  Administered 2014-05-13: 10 mg via INTRAVENOUS

## 2014-05-13 MED ORDER — FENTANYL CITRATE 0.05 MG/ML IJ SOLN
INTRAMUSCULAR | Status: DC | PRN
  Administered 2014-05-13 (×2): 50 ug via INTRAVENOUS
  Administered 2014-05-13: 100 ug via INTRAVENOUS
  Administered 2014-05-13: 50 ug via INTRAVENOUS

## 2014-05-13 MED ORDER — PHENYLEPHRINE 40 MCG/ML (10ML) SYRINGE FOR IV PUSH (FOR BLOOD PRESSURE SUPPORT)
PREFILLED_SYRINGE | INTRAVENOUS | Status: AC
  Filled 2014-05-13: qty 10

## 2014-05-13 MED ORDER — 0.9 % SODIUM CHLORIDE (POUR BTL) OPTIME
TOPICAL | Status: DC | PRN
  Administered 2014-05-13: 1000 mL

## 2014-05-13 MED ORDER — NEOSTIGMINE METHYLSULFATE 10 MG/10ML IV SOLN
INTRAVENOUS | Status: DC | PRN
  Administered 2014-05-13: 5 mg via INTRAVENOUS

## 2014-05-13 MED ORDER — GLYCOPYRROLATE 0.2 MG/ML IJ SOLN
INTRAMUSCULAR | Status: AC
  Filled 2014-05-13: qty 3

## 2014-05-13 MED ORDER — LACTATED RINGERS IV SOLN
INTRAVENOUS | Status: DC | PRN
  Administered 2014-05-13: 07:00:00 via INTRAVENOUS

## 2014-05-13 MED ORDER — MIDAZOLAM HCL 5 MG/5ML IJ SOLN
INTRAMUSCULAR | Status: DC | PRN
  Administered 2014-05-13: 1 mg via INTRAVENOUS

## 2014-05-13 MED ORDER — PHENYLEPHRINE HCL 10 MG/ML IJ SOLN
INTRAMUSCULAR | Status: AC
  Filled 2014-05-13: qty 1

## 2014-05-13 MED ORDER — SODIUM CHLORIDE 0.9 % IV SOLN
INTRAVENOUS | Status: DC | PRN
  Administered 2014-05-13: 07:00:00 via INTRAVENOUS

## 2014-05-13 MED ORDER — AMPICILLIN-SULBACTAM SODIUM 3 (2-1) G IJ SOLR
3.0000 g | Freq: Once | INTRAMUSCULAR | Status: AC
  Administered 2014-05-13: 3 g via INTRAVENOUS
  Filled 2014-05-13: qty 3

## 2014-05-13 MED ORDER — GLYCOPYRROLATE 0.2 MG/ML IJ SOLN
INTRAMUSCULAR | Status: AC
  Filled 2014-05-13: qty 1

## 2014-05-13 MED ORDER — ONDANSETRON HCL 4 MG/2ML IJ SOLN: 4.0000 mg | Freq: Once | INTRAMUSCULAR | Status: DC | PRN

## 2014-05-13 MED ORDER — FENTANYL CITRATE 0.05 MG/ML IJ SOLN
INTRAMUSCULAR | Status: AC
  Filled 2014-05-13: qty 5

## 2014-05-13 MED ORDER — DEXTROSE 50 % IV SOLN
INTRAVENOUS | Status: DC | PRN
  Administered 2014-05-13 (×2): 25 mL via INTRAVENOUS

## 2014-05-13 MED ORDER — GLYCOPYRROLATE 0.2 MG/ML IJ SOLN
INTRAMUSCULAR | Status: DC | PRN
  Administered 2014-05-13: 0.6 mg via INTRAVENOUS

## 2014-05-13 MED ORDER — MIDAZOLAM HCL 2 MG/2ML IJ SOLN
INTRAMUSCULAR | Status: AC
  Filled 2014-05-13: qty 2

## 2014-05-13 MED ORDER — SUCCINYLCHOLINE CHLORIDE 20 MG/ML IJ SOLN
INTRAMUSCULAR | Status: DC | PRN
  Administered 2014-05-13: 100 mg via INTRAVENOUS

## 2014-05-13 MED ORDER — DEXAMETHASONE SODIUM PHOSPHATE 4 MG/ML IJ SOLN
INTRAMUSCULAR | Status: DC | PRN
  Administered 2014-05-13: 10 mg via INTRAVENOUS

## 2014-05-13 MED ORDER — CISATRACURIUM BESYLATE 20 MG/10ML IV SOLN
INTRAVENOUS | Status: AC
  Filled 2014-05-13: qty 10

## 2014-05-13 MED ORDER — ONDANSETRON HCL 4 MG/2ML IJ SOLN
INTRAMUSCULAR | Status: DC | PRN
  Administered 2014-05-13: 4 mg via INTRAVENOUS

## 2014-05-13 MED ORDER — PROPOFOL 10 MG/ML IV BOLUS
INTRAVENOUS | Status: AC
  Filled 2014-05-13: qty 20

## 2014-05-13 SURGICAL SUPPLY — 57 items
BAG URINE DRAINAGE (UROLOGICAL SUPPLIES) ×3 IMPLANT
BASKET LASER NITINOL 1.9FR (BASKET) ×3 IMPLANT
BASKET STONE NITINOL 3FRX115MB (UROLOGICAL SUPPLIES) ×3 IMPLANT
BASKET ZERO TIP NITINOL 2.4FR (BASKET) ×6 IMPLANT
BENZOIN TINCTURE PRP APPL 2/3 (GAUZE/BANDAGES/DRESSINGS) ×12 IMPLANT
BLADE SURG 15 STRL LF DISP TIS (BLADE) ×1 IMPLANT
BLADE SURG 15 STRL SS (BLADE) ×2
CARTRIDGE STONEBREAK CO2 KIDNE (ELECTROSURGICAL) IMPLANT
CATH AINSWORTH 30CC 24FR (CATHETERS) ×3 IMPLANT
CATH BEACON 5.038 65CM KMP-01 (CATHETERS) ×6 IMPLANT
CATH KUMPE (CATHETERS) ×2
CATH ROBINSON RED A/P 20FR (CATHETERS) IMPLANT
CATH SLIP 5FR 0.38 X 40 KMP (CATHETERS) ×1 IMPLANT
CATH URET 5FR 28IN OPEN ENDED (CATHETERS) IMPLANT
CATH URET DUAL LUMEN 6-10FR 50 (CATHETERS) ×3 IMPLANT
CATH X-FORCE N30 NEPHROSTOMY (TUBING) ×3 IMPLANT
COVER SURGICAL LIGHT HANDLE (MISCELLANEOUS) ×3 IMPLANT
DRAPE C-ARM 42X120 X-RAY (DRAPES) ×3 IMPLANT
DRAPE CAMERA CLOSED 9X96 (DRAPES) ×3 IMPLANT
DRAPE LINGEMAN PERC (DRAPES) ×3 IMPLANT
DRAPE SURG IRRIG POUCH 19X23 (DRAPES) ×3 IMPLANT
DRSG PAD ABDOMINAL 8X10 ST (GAUZE/BANDAGES/DRESSINGS) ×6 IMPLANT
DRSG TEGADERM 8X12 (GAUZE/BANDAGES/DRESSINGS) ×3 IMPLANT
EXTRACTOR STONE NITINOL NGAGE (UROLOGICAL SUPPLIES) ×3 IMPLANT
FIBER LASER FLEXIVA 1000 (UROLOGICAL SUPPLIES) IMPLANT
FIBER LASER FLEXIVA 200 (UROLOGICAL SUPPLIES) ×3 IMPLANT
FIBER LASER FLEXIVA 365 (UROLOGICAL SUPPLIES) ×3 IMPLANT
FIBER LASER FLEXIVA 550 (UROLOGICAL SUPPLIES) IMPLANT
FIBER LASER TRAC TIP (UROLOGICAL SUPPLIES) ×6 IMPLANT
GAUZE SPONGE 4X4 12PLY STRL (GAUZE/BANDAGES/DRESSINGS) ×3 IMPLANT
GLOVE SURG SS PI 8.0 STRL IVOR (GLOVE) IMPLANT
GOWN STRL REUS W/TWL XL LVL3 (GOWN DISPOSABLE) ×3 IMPLANT
GUIDEWIRE AMPLAZ .035X145 (WIRE) ×6 IMPLANT
GUIDEWIRE STR DUAL SENSOR (WIRE) ×3 IMPLANT
KIT BASIN OR (CUSTOM PROCEDURE TRAY) ×3 IMPLANT
MANIFOLD NEPTUNE II (INSTRUMENTS) ×3 IMPLANT
MASK EYE SHIELD (GAUZE/BANDAGES/DRESSINGS) ×3 IMPLANT
NS IRRIG 1000ML POUR BTL (IV SOLUTION) ×3 IMPLANT
PACK BASIC VI WITH GOWN DISP (CUSTOM PROCEDURE TRAY) ×3 IMPLANT
PAD ABD 8X10 STRL (GAUZE/BANDAGES/DRESSINGS) ×3 IMPLANT
PROBE KIDNEY STONEBRKR 2.0X425 (ELECTROSURGICAL) IMPLANT
PROBE LITHOCLAST ULTRA 3.8X403 (UROLOGICAL SUPPLIES) ×3 IMPLANT
PROBE PNEUMATIC 1.0MMX570MM (UROLOGICAL SUPPLIES) ×3 IMPLANT
SET IRRIG Y TYPE TUR BLADDER L (SET/KITS/TRAYS/PACK) ×3 IMPLANT
SHEATH ACCESS URETERAL 38CM (SHEATH) ×3 IMPLANT
SPONGE LAP 4X18 X RAY DECT (DISPOSABLE) ×3 IMPLANT
STONE CATCHER W/TUBE ADAPTER (UROLOGICAL SUPPLIES) ×3 IMPLANT
SUT SILK 2 0 30  PSL (SUTURE) ×2
SUT SILK 2 0 30 PSL (SUTURE) ×1 IMPLANT
SYR 20CC LL (SYRINGE) ×6 IMPLANT
SYRINGE 10CC LL (SYRINGE) ×3 IMPLANT
SYRINGE IRR TOOMEY STRL 70CC (SYRINGE) ×3 IMPLANT
TOWEL OR 17X26 10 PK STRL BLUE (TOWEL DISPOSABLE) ×3 IMPLANT
TOWEL OR NON WOVEN STRL DISP B (DISPOSABLE) ×3 IMPLANT
TRAY FOLEY CATH 16FRSI W/METER (SET/KITS/TRAYS/PACK) ×3 IMPLANT
TUBING CONNECTING 10 (TUBING) ×6 IMPLANT
TUBING CONNECTING 10' (TUBING) ×3

## 2014-05-13 NOTE — Progress Notes (Signed)
PT Cancellation Note  Patient Details Name: DESHAN HEMMELGARN MRN: [id number] DOB: 09-04-1950   Cancelled Treatment:    Reason Eval/Treat Not Completed: Patient at procedure or test/unavailable   Taya Ashbaugh,KATHrine E 05/13/2014, 8:45 AM Carmelia Bake, PT, DPT 05/13/2014 Pager: [phone]

## 2014-05-13 NOTE — Progress Notes (Signed)
[name] will be going to surgery today. The platelets had not yet gone in. He does have some hematuria. He said this happened when the Foley catheter got placed into him.  His labs show his white cell count to be 3.4. Hemoglobin 9.9. Platelet 40,000.  His pro time and PTT are normal. I do have a little vitamin K in view of his history of "cirrhosis".  I really cannot think that he will have any bleeding problems with the procedure. The platelets that he will get should be very active and functional so he really shouldn't have good hemostasis. His coags are normal with the vitamin K.  The fact that his hemoglobin is 9.9 that should help with hemostasis.  There is no issues with nausea or vomiting. He's not complaining of any pain.  We had an excellent prayer session. I feel very confident that Dr. Jeffie Pollock, as always, we'll do a fantastic job and have the issues with his hematuria resolved secondary to kidney stones.  I will certainly be available if there are any issues with surgery.  The only thing I can possibly think of that might be an issue is that he has had radiation therapy in the past. I did this was about a year and half ago. As such, there may be some "friability" with the tissue of his urinary system.  [name] clearly is ready for surgery. He really wants to go home.  Pete E.  Romans 8:28

## 2014-05-13 NOTE — Brief Op Note (Signed)
04/28/2014 - 05/13/2014  10:32 AM  PATIENT:  [name]  63 y.o. male  PRE-OPERATIVE DIAGNOSIS:  LEFT RENAL STONES >2cm  and LEFT URETERAL stones  POST-OPERATIVE DIAGNOSIS:  LEFT RENAL STONES and LEFT URETERAL stones  PROCEDURE:  Procedure(s): LEFT NEPHROLITHOTOMY PERCUTANEOUS FIRST STAGE (Left)>2cm LEFT ANTEGRADE URETEROSCOPIC STONE EXTRACTION HOLMIUM LASER APPLICATION (Left) LEFT ANTEGRADE NEPHROSTOGRAM with INTERPRETATION  SURGEON:  Surgeon(s) and Role:    * Malka So, MD - Primary  PHYSICIAN ASSISTANT:   ASSISTANTS: none   ANESTHESIA:   general  EBL:  Total I/O In: 2446 [I.V.:1250; Blood:280] Out: 100 [Blood:100]  BLOOD ADMINISTERED:none  DRAINS: Urinary Catheter (Foley) and 24 Ainsworth Nephrostomy, 9fr Kompy catheter   LOCAL MEDICATIONS USED:  NONE  SPECIMEN:  Source of Specimen:  left renal and ureteral stones  DISPOSITION OF SPECIMEN:  some to path and some to family  COUNTS:  YES  TOURNIQUET:  * No tourniquets in log *  DICTATION: .Other Dictation: Dictation Number [id number]  PLAN OF CARE: Admit to inpatient   PATIENT DISPOSITION:  PACU - hemodynamically stable.   Delay start of Pharmacological VTE agent (>24hrs) due to surgical blood loss or risk of bleeding: yes

## 2014-05-13 NOTE — Progress Notes (Signed)
[facility] PROGRESS NOTE  [name] VFI:[id number] DOB: 10/11/1950 DOA: 04/28/2014 PCP: [name], MD    Brief narrative 63 year old male with history of metastatic colon cancer receiving chemotherapy in [place], anemia, coronary artery disease, history of hydronephrosis status post stenting in April 2015, nephrolithiasis, chronic kidney disease stage III who was sent to the ED from his hematology office. Patient reports intermittent hematuria for of about 2-3 weeks duration. Few days back he thought he passed his ureteral stent while using the bathroom. When he presented to his oncologist for chemotherapy he was found to have progressively worsened renal function. His last normal creatinine was 1.67 about one month back which worsened to 7.1 on 11/16. He was followed up again on 11/23 by his oncologist and his creatinine had worsened to 12.47. A renal ultrasound was done which showed moderate bilateral hydronephrosis with nephrocalcinosis. On 11/30 he was found to have bilateral lower extremity purpura and increased leg swellings. Blood work done showed platelets of 52,000 and creatinine of 18.4. He was also found to be in metabolic acidosis with CO2 9 with high anion gap.  A CT scan of the abdomen done in 11/30 showed increasing moderate to severe right hydronephrosis and severe left hydronephrosis. Also showed a 0.9x0.2 cm calculus at the left UPJ and unchanged multiple bilateral renal calculi  Urology was consulted and recommended admission to [facility] initially but [facility] was [facility].   Patient continues to have hematuria, despite the irrigation. Plan for transferring the patient to [place] for cystoscopy with ureteroscopy for evaluation of the bleeding.     Assessment/Plan: Acute on chronic kidney disease stage III secondary to obstructive uropathy with severe metabolic acidosis  -AG Metabolic acidosis resolved with improvement  in renal fn and bicarb drip . Renal fn improving after foley placed on 11/30. -bedside cystoscopy performed by Dr Jeffie Pollock on 12/3 showing a large amount of old and fresh clot that appears to have come from the left collecting system. There was a clot protruding from the ureteral orifice on the left which was left as is. S/p evacuation of the bladder clots with a 29fr [place] foley. His prostate was short and minimally obstructing and the bladder wall had no evidence of cancer or stones. Foley was removed but had to be placed again on 12/4 due to urinary retention. -most likely source of bleeding are the left ureteral and renal stones,. IR placed rt sided PCN. Still having hematuria. reportedly has blood clots with difficulty flushing out. Continued bladder irrigation. Despite irrigation, he continued to have hematuria and required another unit of prbc transfusion on 12/9. He was transferred to [facility] for cystoscopy with ureteroscopy to better assess the source of bleeding  Status post cystoscopy with evacuation of clots 12/10, left ureterocopy,  CBI  discontinued 12/11, Foley discontinued yesterday but replaced today and voiding trial in a month percutaneous stone treatment on 12/15  Creatinine slowly improving     Severe Anemia and thrombocytopenia Secondary to active hematuria and blood clots. Also contributed by chemotherapy and metastatic colon cancer. Thrombocytopenic worsening today Transfused 2 units of packed red blood cells again on 12/11  Patient has been receiving both PRBC and platelets. He has ongoing thrombocytopenia possibly related to chemotherapy and liver cirrhosis and improving minimally with transfusion.. Patient has received 11 u PRBC since this  admission .   6 units PRBC prior to this admission. -Received IV dextran infusion on 12/7.  Repeat CBC 12/14 Seen  by Dr. Charlesetta Garibaldi hematology on 12/6.  transfuse platelet prior to procedure  Will need platelets  before procedure on Tuesday 12/15 Vit K IM times 3 doses , cont folic acid , repeat INR in am  Platelet transfusion given prior to surgery Follow-up CBC, monitor for hematuria     Sepsis with enterococcus bacteriuria Patient febrile and hypotensive on the evening percutaneous nephrostomy was done (12/5). Urine culture from the percutaneous site growing enterococcus. Currently on amoxicillin Blood culture with no growth to date. Spoke with ID consult Dr. Megan Salon and recommend switching to oral amoxicillin and to continue antibiotic until he has his for lithotripsy done to avoid further sepsis. Since lithotripsy is planned for next week we will treat him for a 2 weeks course. 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 Appreciate recommendations. Follows with oncology in [place].  Palpable purpura Noted HSP/ plantar erythrodysthesia. Reports to be due to chemotherapy Apply topical cream  Protein calorie malnutrition, severe Nutrition consult  B/l leg edema Prn lasix  Mild hypokalemia  Will be repleted as needed. Repeat bmp ordered showed resolution of hypokalemia    DVT prophylaxis: SCD  -Code Status: full code family Communication: none at bedside Disposition Plan: Patient remains inpatient until okay to discharge by urology    Consultants:  Dr Jeffie Pollock  Renal ( signed off)  IR  hematology  Procedures:  Cystoscopy on 12/3  left percutaneous nephrostomy on 12/5 12/11:  1. Cystoscopy with evacuation of clots.             2. Left retrograde pyelogram with interpretation.             3. Left ureteroscopy.  Antibiotics:  IV vanco 12/5--12/7  Oral  Amoxicillin 12/7-- until 12/21  Subjective Patient reports that he feels well. Had urinary retention last night and Foley was replaced, he has a tinge of pink colored urine  today  Objective: Filed Vitals:   05/13/14 0504  BP: 123/57  Pulse: 73  Temp: 98.8 F (37.1 C)  Resp:     Intake/Output Summary (Last 24 hours) at 05/13/14 1042 Last data filed at 05/13/14 0939  Gross per 24 hour  Intake   1900 ml  Output   1850 ml  Net     50 ml   Filed Weights   05/09/14 0457 05/11/14 0522 05/12/14 0508  Weight: 99.701 kg (219 lb 12.8 oz) 98.2 kg (216 lb 7.9 oz) 102.83 kg (226 lb 11.2 oz)    Exam:   General: Lesion male in no acute distress  HEENT: Pallor present, moist mucosa  Chest: Clear breath sounds bilaterally, left Port-A-Cath  CVS: Normal S1-S2, no murmurs  Abdomen: Soft, nondistended, nontender, ileostomy status, left percutaneous nephrostomy draining bloody urine, Foley with blood mixed urine  Extremities: Palpable purpura over the dorsum of bilateral feet, trace edema bilaterally  CNS: Alert and oriented  Data Reviewed: Basic Metabolic Panel:  Recent Labs Lab 05/08/14 1900 05/09/14 0100 05/10/14 0505 05/11/14 0835 05/12/14 0310  NA 139 139 141 138 137  K 4.0 4.7 3.7 3.5* 3.5*  CL 109 110 110 109 110  CO2 21 21 21  18* 19  GLUCOSE 111* 139* 91 115* 79  BUN 23 24* 27* 19 15  CREATININE 1.87* 1.84* 2.11* 1.83* 1.86*  CALCIUM 8.1* 8.1* 8.2* 8.0* 8.1*   Liver Function Tests:  Recent Labs Lab 05/10/14 0505 05/12/14 0310  AST 30 42*  ALT 46 53  ALKPHOS 104 111  BILITOT 1.2 0.8  PROT 5.1* 5.0*  ALBUMIN 2.2* 2.1*   No results for input(s): LIPASE, AMYLASE in the last 168 hours. No results for input(s): AMMONIA in the last 168 hours. CBC:  Recent Labs Lab 05/09/14 0700 05/10/14 0505 05/11/14 0835 05/12/14 0310 05/13/14 0515  WBC 3.5* 7.3 2.9* 2.7* 3.4*  HGB 7.1* 9.0* 9.1* 8.5* 9.9*  HCT 21.7* 27.7* 28.1* 26.7* 29.9*  MCV 93.9 90.5 91.5 91.8 93.1  PLT 44* 54* 41* 33* 40*   Cardiac Enzymes: No results for input(s): CKTOTAL, CKMB, CKMBINDEX, TROPONINI in the last 168 hours. BNP (last 3 results) No results  for input(s): PROBNP in the last 8760 hours. CBG:  Recent Labs Lab 05/13/14 0733  GLUCAP 66*    Recent Results (from the past 240 hour(s))  Culture, blood (routine x 2)     Status: None   Collection Time: 05/03/14  7:26 PM  Result Value Ref Range Status   Specimen Description BLOOD LEFT ARM  Final   Special Requests BOTTLES DRAWN AEROBIC AND ANAEROBIC [facility] EACH  Final   Culture  Setup Time   Final    05/04/2014 03:55 Performed at [place]   Final    NO GROWTH 5 DAYS Performed at [facility]    Report Status 05/10/2014 FINAL  Final  Culture, blood (routine x 2)     Status: None   Collection Time: 05/03/14  7:50 PM  Result Value Ref Range Status   Specimen Description BLOOD RIGHT ANTECUBITAL  Final   Special Requests BOTTLES DRAWN AEROBIC AND ANAEROBIC 10CC EACH  Final   Culture  Setup Time   Final    05/04/2014 03:55 Performed at [place]   Final    NO GROWTH 5 DAYS Performed at [facility]    Report Status 05/10/2014 FINAL  Final  Urine culture     Status: None   Collection Time: 05/03/14  8:29 PM  Result Value Ref Range Status   Specimen Description URINE, RANDOM  Final   Special Requests ADDED 1940 05/04/14  Final   Culture  Setup Time   Final    05/04/2014 20:32 Performed at [place] Performed at [facility]   Final   Culture NO GROWTH Performed at [facility]   Final   Report Status 05/05/2014 FINAL  Final  MRSA PCR Screening     Status: None   Collection Time: 05/04/14 10:39 AM  Result Value Ref Range Status   MRSA by PCR NEGATIVE NEGATIVE Final    Comment:        The GeneXpert MRSA Assay (FDA approved for NASAL specimens only), is one component of a comprehensive MRSA colonization surveillance program. It is not intended to diagnose MRSA infection nor to guide or monitor treatment for MRSA infections.   MRSA PCR Screening      Status: None   Collection Time: 05/13/14  5:25 AM  Result Value Ref Range Status   MRSA by PCR NEGATIVE NEGATIVE Final    Comment:        The GeneXpert MRSA Assay (FDA approved for NASAL specimens only), is one component of a comprehensive MRSA colonization surveillance program. It is not intended to diagnose MRSA infection nor to guide or monitor treatment for MRSA infections.      Studies:  No results found.  Scheduled Meds: . [MAR Hold] sodium chloride   Intravenous Once  . [MAR Hold] amoxicillin  250 mg Oral Q12H  . [MAR Hold] cyanocobalamin  1,000 mcg Intramuscular Q30 days  . [MAR Hold] folic acid  1 mg Oral Daily  . [MAR Hold] hydrocortisone cream  1 application Topical BID  . [MAR Hold] pantoprazole  40 mg Oral Daily  . [MAR Hold] phytonadione  10 mg Subcutaneous Daily  . [MAR Hold] sodium chloride  3 mL Intravenous Q12H   Continuous Infusions:      Time spent: 25 minutes    [facility]  [facility] Pager [phone]  If 7PM-7AM, please contact night-coverage at [email or web address], password [facility] 05/13/2014, 10:42 AM  LOS: 15 days

## 2014-05-13 NOTE — Progress Notes (Signed)
Called on call MD to inform that Platelets will not be available to 6:30 am to 7:00 am prior to surgery. Dr. Awanda Mink gave me instructions to continue to prepare pat for surgery as planned and to inform OR. Called Or to inform them of the current status. All acknowledged will monitor and proceed with platelet adminstration when they become available. SRP, RN

## 2014-05-13 NOTE — Transfer of Care (Signed)
Immediate Anesthesia Transfer of Care Note  Patient: [name]  Procedure(s) Performed: Procedure(s): LEFT NEPHROLITHOTOMY PERCUTANEOUS FIRST STAGE (Left) HOLMIUM LASER APPLICATION (Left)  Patient Location: PACU  Anesthesia Type:General  Level of Consciousness: Patient easily awoken, sedated, comfortable, cooperative, following commands, responds to stimulation.   Airway & Oxygen Therapy: Patient spontaneously breathing, ventilating well, oxygen via simple oxygen mask.  Post-op Assessment: Report given to PACU RN, vital signs reviewed and stable, moving all extremities.   Post vital signs: Reviewed and stable.  Complications: No apparent anesthesia complications

## 2014-05-13 NOTE — Anesthesia Postprocedure Evaluation (Signed)
  Anesthesia Post-op Note  Patient: [name]  Procedure(s) Performed: Procedure(s) (LRB): LEFT NEPHROLITHOTOMY PERCUTANEOUS FIRST STAGE (Left) HOLMIUM LASER APPLICATION (Left)  Patient Location: PACU  Anesthesia Type: General  Level of Consciousness: awake and alert   Airway and Oxygen Therapy: Patient Spontanous Breathing  Post-op Pain: mild  Post-op Assessment: Post-op Vital signs reviewed, Patient's Cardiovascular Status Stable, Respiratory Function Stable, Patent Airway and No signs of Nausea or vomiting  Last Vitals:  Filed Vitals:   05/13/14 1148  BP: 153/79  Pulse: 73  Temp:   Resp: 16    Post-op Vital Signs: stable   Complications: No apparent anesthesia complications

## 2014-05-13 NOTE — Progress Notes (Signed)
Patient ID: [name], male   DOB: 1951/03/02, 63 y.o.   MRN: [id number]   [name] is doing well post op.  His NT and foley drainage is bloody but not consistent with active bleeding.    Post Hgb is 9 and platelet count is 39 despite the transfusion.    AM labs already ordered.   CT stone study in am to assess for residual fragments and need for second look.

## 2014-05-13 NOTE — Discharge Instructions (Signed)
Percutaneous Nephrostomy, Care After °Refer to this sheet in the next few weeks. These instructions provide you with information on caring for yourself after your procedure. Your health care provider may also give you more specific instructions. Your treatment has been planned according to current medical practices, but problems sometimes occur. Call your health care provider if you have any problems or questions after your procedure. °WHAT TO EXPECT AFTER THE PROCEDURE °You will need to remain lying down for several hours. °HOME CARE INSTRUCTIONS °· Your nephrostomy tube is connected to a leg bag or bedside drainage bag. Always keep the tubing, the leg bag, or the bedside drainage bags below the level of the kidney so that the urine drains freely. °· During the day, if you are connecting the nephrostomy tube to a leg bag, be sure there are no kinks in the tubing and that the urine is draining freely. °· At night, you may want to connect the nephrostomy tube or the leg bag to a larger bedside drainage bag. °· Change the dressing as often as directed by your health care provider, or if it becomes wet. °¨ Gently remove the tapes and dressing from around the nephrostomy tube. Be careful not to pull on the tube while removing the dressing. °¨ Wash the skin around the tube, rinse well, and dry. °¨ Place two split drain sponges in and around the tube exit site. °¨ Place tape around edge of the dressing. °¨ Secure the nephrostomy tubing. Remember to make certain that the nephrostomy tube does not kink or become pinched closed. It can be useful to wrap any exposed tubing going from the nephrostomy tube to any of the connecting tubes to either the leg bag or drainage bag with an elastic bandage. °· Every three weeks, replace the leg bag, drainage bag, and any extension tubing connected to your nephrostomy tube. Your health care provider will explain how to change the drainage bag and extension tubing. °SEEK MEDICAL CARE  IF: °· You experience any problems with any of the valves or tubing. °· You have persistent pain or soreness in your back. °· You have a fever or chills. °SEEK IMMEDIATE MEDICAL CARE IF: °· You have abdominal pain during the first week. °· You have a new appearance of blood in your urine. °· You have back pain that is not relieved by your medicine. °· You have drainage, redness, swelling, or pain at the tube insertion site. °· You have decreased urine output. °· Your nephrostomy tube comes out. °Document Released: 01/07/2004 Document Revised: 09/30/2013 Document Reviewed: 01/10/2013 °ExitCare® Patient Information ©2015 [employer]. This information is not intended to replace advice given to you by your health care provider. Make sure you discuss any questions you have with your health care provider. ° °

## 2014-05-14 ENCOUNTER — Inpatient Hospital Stay (HOSPITAL_COMMUNITY): Payer: Managed Care, Other (non HMO)

## 2014-05-14 ENCOUNTER — Ambulatory Visit (HOSPITAL_COMMUNITY): Payer: Managed Care, Other (non HMO)

## 2014-05-14 ENCOUNTER — Encounter (HOSPITAL_COMMUNITY): Payer: Self-pay | Admitting: Urology

## 2014-05-14 LAB — PREPARE PLATELET PHERESIS
Unit division: 0
Unit division: 0

## 2014-05-14 LAB — CBC
HEMATOCRIT: 25 % — AB (ref 39.0–52.0)
Hemoglobin: 7.9 g/dL — ABNORMAL LOW (ref 13.0–17.0)
MCH: 29.8 pg (ref 26.0–34.0)
MCHC: 31.6 g/dL (ref 30.0–36.0)
MCV: 94.3 fL (ref 78.0–100.0)
Platelets: 31 10*3/uL — ABNORMAL LOW (ref 150–400)
RBC: 2.65 MIL/uL — ABNORMAL LOW (ref 4.22–5.81)
RDW: 20.6 % — AB (ref 11.5–15.5)
WBC: 6.2 10*3/uL (ref 4.0–10.5)

## 2014-05-14 LAB — COMPREHENSIVE METABOLIC PANEL
ALBUMIN: 2.1 g/dL — AB (ref 3.5–5.2)
ALT: 38 U/L (ref 0–53)
AST: 26 U/L (ref 0–37)
Alkaline Phosphatase: 110 U/L (ref 39–117)
Anion gap: 8 (ref 5–15)
BILIRUBIN TOTAL: 0.7 mg/dL (ref 0.3–1.2)
BUN: 17 mg/dL (ref 6–23)
CALCIUM: 8 mg/dL — AB (ref 8.4–10.5)
CO2: 20 mEq/L (ref 19–32)
Chloride: 110 mEq/L (ref 96–112)
Creatinine, Ser: 1.93 mg/dL — ABNORMAL HIGH (ref 0.50–1.35)
GFR calc Af Amer: 41 mL/min — ABNORMAL LOW (ref >90)
GFR calc non Af Amer: 36 mL/min — ABNORMAL LOW (ref >90)
Glucose, Bld: 105 mg/dL — ABNORMAL HIGH (ref 70–99)
Potassium: 4.5 mEq/L (ref 3.7–5.3)
Sodium: 138 mEq/L (ref 137–147)
TOTAL PROTEIN: 4.9 g/dL — AB (ref 6.0–8.3)

## 2014-05-14 MED ORDER — SODIUM CHLORIDE 0.9 % IV SOLN
INTRAVENOUS | Status: DC
  Administered 2014-05-14 – 2014-05-16 (×4): via INTRAVENOUS

## 2014-05-14 NOTE — Progress Notes (Signed)
NUTRITION FOLLOW UP  Intervention:   -Continue with Regular liberalized diet -Encouraged continued excellent PO intake to meet increased nutrient needs -Offered additional nutrition resources; encouraged to contact RD for nutrition-related assistance as needed -RD to continue to monitor  Nutrition Dx:   Increased nutrient needs related to catabolic illness as evidenced by estimated nutrition needs; ongoing   Goal:   Pt to meet >/= 90% of their estimated nutrition needs, meeting   Monitor:   PO intake, weight, labs, I/O's  Assessment:   12/07: RD consulted for assessment of nutrition requirements/status.  Patient reports a good appetite. No recent weight loss. Declined addition of oral nutrition supplements.  No muscle or subcutaneous fat depletion noticed.  12/16: -Pt s/p left nephrolithotomy. POD 1 -Diet liberalized to regular on 12/10 -Eating 75-100% of meals. Denied any nausea, abd pain or difficulty with meals -Wife in room confirmed appetite. Encouraged continue excellent appetite d/t chronic illness. Wife and pt verbalized understanding. -Crt elevated at 1.9 but improving   Height: Ht Readings from Last 1 Encounters:  05/08/14 5\' 10"  (1.778 m)    Weight Status:   Wt Readings from Last 1 Encounters:  05/14/14 222 lb (100.699 kg)  05/05/14 227 lb  Re-estimated needs:  Kcal: 2250-2450 Protein: 115-125 gm Fluid: 1200 ml   Skin: +3 RLE, +3LLE edema  Diet Order: Diet regular   Intake/Output Summary (Last 24 hours) at 05/14/14 1109 Last data filed at 05/14/14 6256  Gross per 24 hour  Intake    480 ml  Output    665 ml  Net   -185 ml    Last BM: 12/13; colostomy   Labs:   Recent Labs Lab 05/11/14 0835 05/12/14 0310 05/14/14 0629  NA 138 137 138  K 3.5* 3.5* 4.5  CL 109 110 110  CO2 18* 19 20  BUN 19 15 17   CREATININE 1.83* 1.86* 1.93*  CALCIUM 8.0* 8.1* 8.0*  GLUCOSE 115* 79 105*    CBG (last 3)   Recent Labs  05/13/14 0733  05/13/14 0922 05/13/14 1108  GLUCAP 66* 90 91    Scheduled Meds: . sodium chloride   Intravenous Once  . amoxicillin  250 mg Oral Q12H  . cyanocobalamin  1,000 mcg Intramuscular Q30 days  . folic acid  1 mg Oral Daily  . hydrocortisone cream  1 application Topical BID  . pantoprazole  40 mg Oral Daily  . sodium chloride  3 mL Intravenous Q12H    Continuous Infusions: . lactated ringers    . lactated ringers      Atlee Abide MS RD LDN Clinical Dietitian [phone]

## 2014-05-14 NOTE — Care Management Note (Signed)
    Page 1 of 1   05/14/2014     11:41:18 AM CARE MANAGEMENT NOTE 05/14/2014  Patient:  TORRE, SCHAUMBURG   Account Number:  [id number]  Date Initiated:  05/14/2014  Documentation initiated by:  Sunday Spillers  Subjective/Objective Assessment:   63 yo male admitted with hematuria, s/p bilateral nephrostomy tubes. PTA lived at home with spouse. 1     Action/Plan:   Home when stable   Anticipated DC Date:  05/15/2014   Anticipated DC Plan:  [place]  CM consult      [facility] Choice  HOME HEALTH   Choice offered to / List presented to:  C-1 Patient           [place].   Status of service:  In process, will continue to follow Medicare Important Message given?   (If response is "NO", the following Medicare IM given date fields will be blank) Date Medicare IM given:   Medicare IM given by:   Date Additional Medicare IM given:   Additional Medicare IM given by:    Discharge Disposition:  [place]  Per UR Regulation:  Reviewed for med. necessity/level of care/duration of stay  If discussed at [place] of Stay Meetings, dates discussed:    Comments:  05-14-14 Sunday Spillers RN CM 1100 Spoke with patient at bedside. Discussed HH needs for nephrostomy care. Agree with need for [facility] RN. Has used [facility] in the past and would like to use them again. Contacted [facility] to arrange, awaiting final orders.

## 2014-05-14 NOTE — Progress Notes (Signed)
Mr. [name] had a surgery yesterday. He got through it well. There is really no problems with bleeding. He got 2 units of platelets before surgery.  He had a few stones removed from the left kidney.  There is blood in the nephrostomy bag.  His platelet count is 31,000 this morning. This is somewhat surprising given that he had 2 units of blood yesterday. It is possible that that there might be some alloimmunization to platelets. We will have to see what his platelet count is tomorrow.  He feels well. He has no abdominal pain. He is hungry. He's had no nausea or vomiting.  His hemoglobin is 7.9. This is down from 9.0 from yesterday.  He is afebrile. His blood pressure is 115/62. Pulse is 64. His abdomen is soft. Bowel sounds might be slightly decreased. Lungs are clear. Cardiac exam regular rate and rhythm.  We will continue to watch him closely. I will see about his blood counts and check his CBC tomorrow.  Laurey Arrow

## 2014-05-14 NOTE — Progress Notes (Addendum)
[facility] PROGRESS NOTE  [name] JOA:[id number] DOB: 1950-07-09 DOA: 04/28/2014 PCP: [name], MD    Brief narrative 63 year old male with history of metastatic colon cancer receiving chemotherapy in [place], anemia, coronary artery disease, history of hydronephrosis status post stenting in April 2015, nephrolithiasis, chronic kidney disease stage III who was sent to the ED from his hematology office. Patient reports intermittent hematuria for of about 2-3 weeks duration. Few days back he thought he passed his ureteral stent while using the bathroom. When he presented to his oncologist for chemotherapy he was found to have progressively worsened renal function. His last normal creatinine was 1.67 about one month back which worsened to 7.1 on 11/16. He was followed up again on 11/23 by his oncologist and his creatinine had worsened to 12.47. A renal ultrasound was done which showed moderate bilateral hydronephrosis with nephrocalcinosis. On 11/30 he was found to have bilateral lower extremity purpura and increased leg swellings. Blood work done showed platelets of 52,000 and creatinine of 18.4. He was also found to be in metabolic acidosis with CO2 9 with high anion gap.  A CT scan of the abdomen done in 11/30 showed increasing moderate to severe right hydronephrosis and severe left hydronephrosis. Also showed a 0.9x0.2 cm calculus at the left UPJ and unchanged multiple bilateral renal calculi  Urology was consulted and recommended admission to [facility] initially but [facility] was [facility].   Patient continues to have hematuria, despite the irrigation. Plan for transferring the patient to [place] for cystoscopy with ureteroscopy for evaluation of the bleeding.     Assessment/Plan: Acute on chronic kidney disease stage III secondary to obstructive uropathy with severe metabolic acidosis  -AG Metabolic acidosis resolved with improvement  in renal fn and bicarb drip . Renal fn improving after foley placed on 11/30. -bedside cystoscopy performed by Dr Jeffie Pollock on 12/3 showing a large amount of old and fresh clot that appears to have come from the left collecting system. There was a clot protruding from the ureteral orifice on the left which was left as is. S/p evacuation of the bladder clots with a 75fr Ainsworth foley. His prostate was short and minimally obstructing and the bladder wall had no evidence of cancer or stones. Foley was removed but had to be placed again on 12/4 due to urinary retention. -most likely source of bleeding are the left ureteral and renal stones,. IR placed rt sided PCN. Still having hematuria. reportedly has blood clots with difficulty flushing out. Continued bladder irrigation. Despite irrigation, he continued to have hematuria and required another unit of prbc transfusion on 12/9. He was transferred to [facility] long for cystoscopy with ureteroscopy to better assess the source of bleeding  Status post cystoscopy with evacuation of clots 12/10, left ureterocopy,  CBI  discontinued 12/11, Foley discontinued yesterday but replaced  and voiding trial in a month percutaneous stone treatment on 12/15, status post nephrostomy tube placement  Creatinine slowly improving Start the patient on gentle IV hydration    Severe Anemia and thrombocytopenia Secondary to active hematuria and blood clots. Also contributed by chemotherapy and metastatic colon cancer.  Patient has been receiving both PRBC and platelets. He has ongoing thrombocytopenia possibly related to chemotherapy and liver cirrhosis and improving minimally with transfusion.. Patient has received 11 u PRBC since this  admission .   6 units PRBC prior to this admission.Has received 2 units of platelets patient has received 2 units of platelets -Received IV  dextran infusion on 12/7.  Hematology following appreciate their input Transfused 2 U platelet  prior to procedure 12/15 Patient continues to have hematuria, hematology wants to monitor platelet counts tonight Hemoglobin drop from 9-7.9, defer further transfusions to hematology     Sepsis with enterococcus bacteriuria Patient febrile and hypotensive on the evening percutaneous nephrostomy was done (12/5). Urine culture from the percutaneous site growing enterococcus. Currently on amoxicillin Blood culture with no growth to date. Spoke with ID consult Dr. Megan Salon and recommend switching to oral amoxicillin and to continue antibiotic until he has his for lithotripsy done to avoid further sepsis. Since lithotripsy is planned for next week we will treat him for a 2 weeks course. 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 Appreciate recommendations. Follows with oncology in [place].  Palpable purpura Noted HSP/ plantar erythrodysthesia. Reports to be due to chemotherapy Apply topical cream  Protein calorie malnutrition, severe Nutrition consult  B/l leg edema Prn lasix  Mild hypokalemia  Will be repleted as needed. Repeat bmp ordered showed resolution of hypokalemia    DVT prophylaxis: SCD  -Code Status: full code family Communication: none at bedside Disposition Plan: Patient remains inpatient until okay to discharge by urology, hematology    Consultants:  Dr Jeffie Pollock  Renal ( signed off)  IR  hematology  Procedures:  Cystoscopy on 12/3  left percutaneous nephrostomy on 12/5 12/11:  1. Cystoscopy with evacuation of clots.             2. Left retrograde pyelogram with interpretation.             3. Left ureteroscopy.  Antibiotics:  IV vanco 12/5--12/7  Oral  Amoxicillin 12/7-- until 12/21  Subjective Patient has bloody drainage from the nephrostomy tube, hemodynamically stable  Objective: Filed Vitals:   05/14/14  1254  BP: 110/48  Pulse: 69  Temp: 98.1 F (36.7 C)  Resp: 18    Intake/Output Summary (Last 24 hours) at 05/14/14 1406 Last data filed at 05/14/14 1256  Gross per 24 hour  Intake    720 ml  Output    850 ml  Net   -130 ml   Filed Weights   05/12/14 0508 05/14/14 0025 05/14/14 0500  Weight: 102.83 kg (226 lb 11.2 oz) 100.789 kg (222 lb 3.2 oz) 100.699 kg (222 lb)    Exam:   General: Lesion male in no acute distress  HEENT: Pallor present, moist mucosa  Chest: Clear breath sounds bilaterally, left Port-A-Cath  CVS: Normal S1-S2, no murmurs  Abdomen: Soft, nondistended, nontender, ileostomy status, left percutaneous nephrostomy draining bloody urine, Foley with blood mixed urine  Extremities: Palpable purpura over the dorsum of bilateral feet, trace edema bilaterally  CNS: Alert and oriented  Data Reviewed: Basic Metabolic Panel:  Recent Labs Lab 05/09/14 0100 05/10/14 0505 05/11/14 0835 05/12/14 0310 05/14/14 0629  NA 139 141 138 137 138  K 4.7 3.7 3.5* 3.5* 4.5  CL 110 110 109 110 110  CO2 21 21 18* 19 20  GLUCOSE 139* 91 115* 79 105*  BUN 24* 27* 19 15 17   CREATININE 1.84* 2.11* 1.83* 1.86* 1.93*  CALCIUM 8.1* 8.2* 8.0* 8.1* 8.0*   Liver Function Tests:  Recent Labs Lab 05/10/14 0505 05/12/14 0310 05/14/14 0629  AST 30 42* 26  ALT 46 53 38  ALKPHOS 104 111 110  BILITOT 1.2 0.8 0.7  PROT 5.1* 5.0*  4.9*  ALBUMIN 2.2* 2.1* 2.1*   No results for input(s): LIPASE, AMYLASE in the last 168 hours. No results for input(s): AMMONIA in the last 168 hours. CBC:  Recent Labs Lab 05/11/14 0835 05/12/14 0310 05/13/14 0515 05/13/14 1120 05/14/14 0629  WBC 2.9* 2.7* 3.4* 2.8* 6.2  HGB 9.1* 8.5* 9.9* 9.0* 7.9*  HCT 28.1* 26.7* 29.9* 28.2* 25.0*  MCV 91.5 91.8 93.1 94.0 94.3  PLT 41* 33* 40* 39* 31*   Cardiac Enzymes: No results for input(s): CKTOTAL, CKMB, CKMBINDEX, TROPONINI in the last 168 hours. BNP (last 3 results) No results for  input(s): PROBNP in the last 8760 hours. CBG:  Recent Labs Lab 05/13/14 0733 05/13/14 0922 05/13/14 1108  GLUCAP 66* 90 91    Recent Results (from the past 240 hour(s))  MRSA PCR Screening     Status: None   Collection Time: 05/13/14  5:25 AM  Result Value Ref Range Status   MRSA by PCR NEGATIVE NEGATIVE Final    Comment:        The GeneXpert MRSA Assay (FDA approved for NASAL specimens only), is one component of a comprehensive MRSA colonization surveillance program. It is not intended to diagnose MRSA infection nor to guide or monitor treatment for MRSA infections.      Studies: Ct Abdomen Pelvis Wo Contrast  05/14/2014   CLINICAL DATA:  Hematuria. Left nephrolithotomy yesterday. Renal calculus.  EXAM: CT ABDOMEN AND PELVIS WITHOUT CONTRAST  TECHNIQUE: Multidetector CT imaging of the abdomen and pelvis was performed following the standard protocol without IV contrast.  COMPARISON:  Multiple exams, including 04/28/2014  FINDINGS: Lower chest: Small subpleural nodules in the right middle lobe measure up to 4 mm. Trace bilateral pleural effusions. Low-density blood pool suggests anemia. Lymph node with fatty hilum adjacent to the distal esophagus. Small epicardial lymph nodes are present. Coronary atherosclerosis.  Hepatobiliary: Small size the lateral segment left hepatic lobe with irregular high density within an along segment 3 of the liver, significance uncertain, but I favor cirrhosis. Possible recanalized umbilical vein.  Pancreas: Unremarkable  Spleen: Mild splenomegaly, splenic volume 600 cc. Mild prominence of the splenic vein.  Adrenals/Urinary Tract: Bilateral hypodense renal lesions favoring cysts. Large nephrostomy tube noted with high density fluid in the left collecting system and in internal stent or tube tracking through the nephrostomy tube and down the left ureter to the urinary bladder. The prior 1.5 cm left collecting system calculus is no longer present. No left  ureteral calculus observed. There is a questionable 1-2 mm left mid kidney calculus on image 34 of series 2. Stable calculi air observed in the right renal collecting system, largest 0.8 cm in long axis on image 33 of series 2. No right hydronephrosis or hydroureter. Both ureters deviated medially towards the cicatricial reaction and what may be a rectal stump. A Foley catheter is present in the urinary bladder which demonstrates nondistention and mild wall thickening which may primarily be due to the nondistention.  Stomach/Bowel: Complicated appearance in the pelvis. 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 Much of this could be from a prior radiation therapy and appear similar to prior exams ; there are loops of bowel adjacent to the into the rectal stump and can activity is difficult to completely exclude, correlate with operative history. There is some narrowing or cicatricial  reaction near the is suspected and of the rectal stump, drying the ureters medially.  There also some scattered air-fluid levels in nondilated loops of small bowel. Anastomotic staple line noted below the umbilicus along a loop of central abdominal bowel.  Vascular/Lymphatic: Aortoiliac atherosclerotic vascular disease.  Reproductive: Mild enlargement and marginal indistinctness of the prostate gland, although this is chronic and suspected the marginal indistinctness is due to the generalized perirectal stranding.  Other: Scattered mild ascites. Diffuse low-grade mesenteric edema. Considerable perirectal stranding/edema.  Musculoskeletal: Mild anterior wedging of T11 and T12 appears chronic. Degenerative facet arthropathy on the right at L4-5.  IMPRESSION: 1. High density fluid in the left collecting system could represent some residual  contrast although blood in the collecting system is certainly a differential diagnostic consideration. Prior large left collecting system and left proximal ureteral calculi are no longer present; there is left nephrostomy tube and a stent extending down to the urinary bladder. No significant renal capsular hematoma; there is right nonobstructive nephrolithiasis and bilateral renal cysts. 2. Hepatic cirrhosis with splenomegaly. 3. Extensive perirectal stranding, similar to prior, with some cicatricial reaction around what is thought to be the end of a rectal stump causing medial deviation of the ureters. 4. 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 There is scattered air- fluid levels in relatively nondilated loops of small bowel, along with a small amount of ascites and small bilateral pleural effusions. 5. Low-density blood pool compatible with anemia 6. Additional findings include small subpleural nodules in the right middle lobe which seems stable ; Coronary atherosclerosis ; and diffuse low-grade mesenteric edema.   Electronically Signed   By: Sherryl Barters M.D.   On: 05/14/2014 09:40   Dg C-arm Gt 120 Min-no Report  05/13/2014   CLINICAL DATA: kidney stones   C-ARM GT 120 MINUTE  Fluoroscopy was utilized by the requesting physician.  No radiographic  interpretation.     Scheduled Meds: . sodium chloride   Intravenous Once  . amoxicillin  250 mg Oral Q12H  . cyanocobalamin  1,000 mcg Intramuscular Q30 days  . folic acid  1 mg Oral Daily  . hydrocortisone cream  1 application Topical BID  . pantoprazole  40 mg Oral Daily  . sodium chloride  3 mL Intravenous Q12H   Continuous Infusions: . lactated ringers    . lactated ringers        Time spent: 25 minutes    [facility] Pager [phone]  If 7PM-7AM, please contact night-coverage at [email or web address], password [facility] 05/14/2014, 2:06 PM  LOS: 16 days

## 2014-05-14 NOTE — Op Note (Signed)
[name], [name]               ACCOUNT NO.:  [id number]  MEDICAL RECORD NO.:  [id number]  LOCATION:  [place]                         FACILITY:  [facility]  PHYSICIAN:  Marshall Cork. Jeffie Pollock, M.D.    DATE OF BIRTH:  11/06/1950  DATE OF PROCEDURE:  05/13/2014 DATE OF DISCHARGE:                              OPERATIVE REPORT   PROCEDURE: 1. Left firt stage percutaneous nephrolithotomy greater than 2 cm stone burden. 2. Left antegrade ureteroscopic stone extraction with holmium laser     application 3. Left antegrade nephrostogram with interpretation.  PREOPERATIVE DIAGNOSIS:  Multiple left renal stones with greater than 2 cm stone burden and approximately greater than 1 cm left proximal ureteral stone with obstruction.  POSTOPERATIVE DIAGNOSIS:  Multiple left renal stones with greater than 2 cm stone burden and approximately greater than 1 cm left proximal ureteral stone with obstruction.  SURGEON:  Marshall Cork. Jeffie Pollock, M.D.  ANESTHESIA:  General.  SPECIMEN:  Stone fragments.  DRAINS:  A 20-French Foley catheter, a 24-French Ainsworth left nephrostomy tube, and 6-French Kumpe left ureteral safety catheter.  BLOOD LOSS:  Approximately 200 mL.  COMPLICATIONS:  None.  INDICATIONS:  Mr. Fonder is a 63 year old white male with a history of urolithiasis who was admitted a few weeks ago with clot retention which proved to be from left ureteral bleeding.  He has multiple left renal stones with a burden greater than 2 cm.  The largest stone in the lower pole is 2 cm.  Additionally, there was a greater than 1 cm stone in the left proximal ureter with obstruction and was felt to be the possible source of the bleeding.  He has a history of thrombocytopenia with platelet count of 40 this morning and was given preoperative platelet transfusion of 4 units.  He was given Unasyn for antibiotic coverage.  FINDINGS AND PROCEDURE:  He previously had placement of a left nephrostomy tube by IR.  He was taken to  the operating room where the Unasyn 3 g was given and the platelet infusion was completed.  A general anesthetic was induced on the holding room stretcher.  He already had a Foley catheter in place.  He was fitted with PAS hose.  He was turned prone on chest rolls with great care being taken to pad all pressure points.  His left nephrostomy tube site was prepped with Betadine solution and draped in usual sterile fashion.  The left nephrostomy tube was then removed over a wire.  A Kumpe catheter was then used to negotiate the wire into the distal ureter. The skin incision was increased to 2 cm with a knife and a dual-lumen catheter was then placed over the wire into the proximal ureter and a 2nd Bentson Super Stiff wire was passed through the 2nd port, however, would not go beyond the stone in the proximal ureter.  The dual-lumen catheter was then removed and the 2nd wire was then used to pass the NephroMax balloon into the renal pelvis.  The balloon was inflated to 18 atmospheres and the nephrostomy access sheath was passed over the balloon into the renal pelvis.  The balloon was removed, but the wire was left in place.  There was minimal bleeding during this portion of procedure.  A rigid nephroscope was then passed and some clot was removed with a 2- prong grasper and initial stone was identified that was consistent with the mid pole stone seen on preoperative imaging.  This was engaged with the ultrasonic lithotrite and fragmented.  The fragments were then removed with 2 and 3-prong graspers.  Once this stone burden had been removed, a flexible nephroscope was then passed and negotiated into the lower pole calix where the 2 cm stone was present.  I was able to manipulate the stone out of the lower calyx into the renal pelvis, with the flexible scope.  The rigid scope was then replaced and the ultrasonic lithotrite was then used once again to fragment the stone.  The stone  fragments were removed with the 2 and 3-prong graspers.  Once both of the renal stones had been removed, I turned my attention to the proximal ureteral stone.  The flexible nephroscope was then advanced alongside the wire into the proximal ureter, and I was able to visualize the stone.  The 2nd wire was removed because the coil was obscuring vision leaving the safety wire down the ureter.  At this point, I elected to pass a 2 cm Nitinol basket to try to remove the stone intact.  The basket did engage the stone, but it became lodged and I was not able to remove the stone and basket.  The handle was cut off of the basket and the scope was removed and then reinserted alongside the basket.  A 365 micron holmium laser fiber was then passed through the flexible scope and the stone was engaged. Initially, we used 1 watt and 10 hertz and broke the stone into relatively large fragments, but they were felt to be small enough to remove.  Initial attempts to engage these stone fragments with the Escape basket were unsuccessful; and eventually, I converted to an NGage basket and was able to successfully remove several fragments, however, some remained.  During inspection of the area of the stone impaction, there was some bleeding from the ureteral wall which was consistent with the ureteral stone being the source of his previous bleeding.  The stone was then engaged again with the laser and broken into additional fragments.  I was eventually able to remove the initial basket, however, no stones came with the basket and several additional fragments were removed with the Nitinol basket and the Escape basket.  At this point, I could still see some small fragments in the ureter, but I could not reach them with the flexible scope, so fiberoptic flexible ureteroscope was then passed into the proximal ureter.  I was able to get closer to the residual stone fragment and engaged it once again with the  laser fiber at this time using the 200 micron fiber set on 0.5 watts, initially on 10 hertz, but eventually on 50 hertz.  The stone was then fragmented into small fragments.  I chased the stones down the ureter and was able to get the scope into the distal ureter, but not into the bladder.  At this point, I passed a 35 cm 12/14 digital access sheath.  Initially, the inner core was passed followed by the assembled sheath into the distal ureter.  The core and wire were removed and ureteroscope was passed.  I was still not able to negotiate into the bladder.  At this point, a Kumpe catheter was placed over the wire into the  distal ureter through the access sheath.  I was able to negotiate this into the bladder confirming I was in the true lumen of the ureter.  The ureteroscope and access sheath were then removed.  No significant distal ureteral stone fragments were identified.  There was 1 small fragment in proximal ureter that I attempted several times to engage, but was not successfully able to do it.  I am not sure exactly how big it is, but it appeared small.  At this point, the rigid nephroscope was replaced and several clots were removed from the collecting system.  I then placed a 24 Ainsworth into the renal pelvis and backed out the nephrostomy sheath and the balloon, and the catheter was filled with 3 mL of sterile water and the sheath was backed out remaining way.  A long Kumpe catheter was then passed over the safety wire and advanced into the bladder.  The wire was then removed and the Kumpe catheter was capped.  An antegrade nephrostogram was then performed through the Ainsworth catheter, this revealed the tube in good position with no extravasation.  There was a little clot in the collecting system.  There was not good antegrade flow.  The catheter balloon was reduced from 3 mL to 2 mL based on the fluoroscopic view.  The Kumpe and Ainsworth catheters were then  secured to the skin with 2-0 silk suture.  The nephrostomy sheath was then cut away from the catheter.  The Ainsworth catheter was placed to straight drainage.  The drapes were removed.  The wound was cleansed.  A dressing was applied.  The patient was then rolled prone on a recovery room stretcher.  His anesthetic was reversed, and he was removed to the recovery room in stable condition.  There were no complications.     Marshall Cork. Jeffie Pollock, M.D.     JJW/MEDQ  D:  05/13/2014  T:  05/14/2014  Job:  [id number]  cc:   Manon Hilding. Sheldon Silvan III, PA-C Fax: [phone]

## 2014-05-14 NOTE — Progress Notes (Signed)
Patient ID: [name], male   DOB: 1950/06/16, 63 y.o.   MRN: [id number] 1 Day Post-Op  Subjective: [name] is doing well.  He has not had pain since last night.  His NT drainage remains bloody but his foley urine is clear.   CT and am labs pending.  ROS:  Review of Systems  Constitutional: Negative for fever.  Gastrointestinal: Negative for nausea.    Anti-infectives: Anti-infectives    Start     Dose/Rate Route Frequency Ordered Stop   05/13/14 0800  [MAR Hold]  Ampicillin-Sulbactam (UNASYN) 3 g in sodium chloride 0.9 % 100 mL IVPB     (MAR Hold since 05/13/14 0728)   3 g100 mL/hr over 60 Minutes Intravenous  Once 05/13/14 0721 05/13/14 0803   05/08/14 1645  Ampicillin-Sulbactam (UNASYN) 3 g in sodium chloride 0.9 % 100 mL IVPB     3 g100 mL/hr over 60 Minutes Intravenous To Surgery 05/08/14 1631 05/08/14 1656   05/05/14 1300  amoxicillin (AMOXIL) capsule 250 mg     250 mg Oral Every 12 hours 05/05/14 1227     05/05/14 1215  amoxicillin (AMOXIL) chewable tablet 250 mg  Status:  Discontinued     250 mg Oral Every 12 hours 05/05/14 1214 05/05/14 1227   05/03/14 2200  vancomycin (VANCOCIN) IVPB 1000 mg/200 mL premix  Status:  Discontinued     1,000 mg200 mL/hr over 60 Minutes Intravenous Every 24 hours 05/03/14 2151 05/05/14 1214   05/03/14 1200  ceFAZolin (ANCEF) IVPB 2 g/50 mL premix     2 g100 mL/hr over 30 Minutes Intravenous On call 05/02/14 1254 05/03/14 1011   05/03/14 0914  ceFAZolin (ANCEF) 2-3 GM-% IVPB SOLR    Comments:  Shaaron Adler   : cabinet override      05/03/14 0914 05/03/14 2114      Current Facility-Administered Medications  Medication Dose Route Frequency Provider Last Rate Last Dose  . 0.9 %  sodium chloride infusion   Intravenous Once Reyne Dumas, MD      . acetaminophen (TYLENOL) tablet 650 mg  650 mg Oral Q6H PRN Nishant Dhungel, MD   650 mg at 05/03/14 1810  . amoxicillin (AMOXIL) capsule 250 mg  250 mg Oral Q12H Nishant Dhungel, MD   250 mg at  05/13/14 2206  . cyanocobalamin ((VITAMIN B-12)) injection 1,000 mcg  1,000 mcg Intramuscular Q30 days Shanda Howells, MD   1,000 mcg at 04/29/14 0947  . folic acid (FOLVITE) tablet 1 mg  1 mg Oral Daily Shanda Howells, MD   1 mg at 05/12/14 7829  . HYDROcodone-acetaminophen (NORCO/VICODIN) 5-325 MG per tablet 1-2 tablet  1-2 tablet Oral Q6H PRN Shanda Howells, MD   2 tablet at 05/13/14 2206  . hydrocortisone cream 1 % 1 application  1 application Topical BID Shanda Howells, MD   1 application at 56/21/30 2302  . hydrocortisone cream 1 %   Topical TID PRN Carmin Muskrat, MD      . HYDROmorphone (DILAUDID) injection 2 mg  2 mg Intravenous Q4H PRN Nishant Dhungel, MD   2 mg at 05/13/14 1911  . lactated ringers infusion   Intravenous Continuous Reyne Dumas, MD      . lactated ringers infusion   Intravenous Continuous Reyne Dumas, MD      . nystatin (MYCOSTATIN/NYSTOP) topical powder   Topical BID PRN Nishant Dhungel, MD      . ondansetron (ZOFRAN) tablet 8 mg  8 mg Oral BID PRN Shanda Howells, MD      .  pantoprazole (PROTONIX) EC tablet 40 mg  40 mg Oral Daily Shanda Howells, MD   40 mg at 05/12/14 0933  . [MAR Hold] phytonadione (VITAMIN K) SQ injection 10 mg  10 mg Subcutaneous Daily Volanda Napoleon, MD   10 mg at 05/12/14 1057  . sodium chloride 0.9 % injection 10-40 mL  10-40 mL Intracatheter PRN Nishant Dhungel, MD   20 mL at 05/08/14 0550  . sodium chloride 0.9 % injection 10-40 mL  10-40 mL Intracatheter PRN Malka So, MD   10 mL at 05/14/14 2800  . sodium chloride 0.9 % injection 3 mL  3 mL Intravenous Q12H Shanda Howells, MD   3 mL at 05/13/14 2207  . zolpidem (AMBIEN) tablet 5 mg  5 mg Oral QHS PRN Eugenie Filler, MD   5 mg at 05/13/14 2206   Facility-Administered Medications Ordered in Other Encounters  Medication Dose Route Frequency Provider Last Rate Last Dose  . heparin lock flush 100 unit/mL  500 Units Intravenous Once Pieter Partridge, MD      . sodium chloride 0.9 % injection 10  mL  10 mL Intravenous PRN Pieter Partridge, MD   10 mL at 07/17/12 1113     Objective: Vital signs in last 24 hours: Temp:  [97.6 F (36.4 C)-98.2 F (36.8 C)] 97.7 F (36.5 C) (12/16 0521) Pulse Rate:  [64-92] 64 (12/16 0521) Resp:  [16-19] 18 (12/16 0521) BP: (109-153)/(53-79) 115/62 mmHg (12/16 0521) SpO2:  [100 %] 100 % (12/16 0521) Weight:  [100.789 kg (222 lb 3.2 oz)] 100.789 kg (222 lb 3.2 oz) (12/16 0025)  Intake/Output from previous day: 12/15 0701 - 12/16 0700 In: 2610 [P.O.:480; I.V.:1750; Blood:280; IV Piggyback:100] Out: 73 [Urine:515; Stool:100; Blood:200] Intake/Output this shift: Total I/O In: -  Out: 500 [Urine:400; Stool:100]   Physical Exam  Constitutional: He is well-developed, well-nourished, and in no distress.  Genitourinary:  NT and foley drainage checked and noted above.   Vitals reviewed.   Lab Results:   Recent Labs  05/13/14 0515 05/13/14 1120  WBC 3.4* 2.8*  HGB 9.9* 9.0*  HCT 29.9* 28.2*  PLT 40* 39*   BMET  Recent Labs  05/11/14 0835 05/12/14 0310  NA 138 137  K 3.5* 3.5*  CL 109 110  CO2 18* 19  GLUCOSE 115* 79  BUN 19 15  CREATININE 1.83* 1.86*  CALCIUM 8.0* 8.1*   PT/INR  Recent Labs  05/12/14 0310 05/13/14 0515  LABPROT 14.3 13.9  INR 1.10 1.06   ABG No results for input(s): PHART, HCO3 in the last 72 hours.  Invalid input(s): PCO2, PO2  Studies/Results: Dg C-arm Gt 120 Min-no Report  05/13/2014   CLINICAL DATA: kidney stones   C-ARM GT 120 MINUTE  Fluoroscopy was utilized by the requesting physician.  No radiographic  interpretation.      Assessment: s/p Procedure(s): LEFT NEPHROLITHOTOMY PERCUTANEOUS FIRST STAGE HOLMIUM LASER APPLICATION  He is doing well post op with clearing of the foley output.  Dark old blood in NT drainage.  CT pending.  Plan: I will reassess post CT to see if a second look is indicated.  I am concerned about his bladder function and when he does go home, it may need  to be with a foley since he has failed several voiding trials during this hospitalization.      LOS: 16 days    Riona Lahti J 05/14/2014

## 2014-05-14 NOTE — Progress Notes (Signed)
Patient ID: [name], male   DOB: [birth year], 63 y.o.   MRN: [id number]   The CT shows only a single tiny 1-36mm fragment in the left kidney.   He will not need a second look.   There was some blood in the collecting system and the nephrostomy tube continues to drain bloody urine but the foley is draining clear urine.     I will order an antegrade nephrostogram for tomorrow.  If there is good antegrade flow, I will have them place a loop nephrostomy tube just for safety but if there is poor antegrade flow, I will have them place an externalized left nephroureteral catheter for the time being and will restudy before considering internal stenting.    There is still a question of bladder functionality and whether he will be able to void on his own and he will need a voiding trial at some point.

## 2014-05-15 DIAGNOSIS — D6959 Other secondary thrombocytopenia: Secondary | ICD-10-CM

## 2014-05-15 LAB — COMPREHENSIVE METABOLIC PANEL WITH GFR
ALT: 34 U/L (ref 0–53)
AST: 22 U/L (ref 0–37)
Albumin: 2 g/dL — ABNORMAL LOW (ref 3.5–5.2)
Alkaline Phosphatase: 101 U/L (ref 39–117)
Anion gap: 9 (ref 5–15)
BUN: 16 mg/dL (ref 6–23)
CO2: 19 meq/L (ref 19–32)
Calcium: 8 mg/dL — ABNORMAL LOW (ref 8.4–10.5)
Chloride: 108 meq/L (ref 96–112)
Creatinine, Ser: 2 mg/dL — ABNORMAL HIGH (ref 0.50–1.35)
GFR calc Af Amer: 40 mL/min — ABNORMAL LOW
GFR calc non Af Amer: 34 mL/min — ABNORMAL LOW
Glucose, Bld: 77 mg/dL (ref 70–99)
Potassium: 3.9 meq/L (ref 3.7–5.3)
Sodium: 136 meq/L — ABNORMAL LOW (ref 137–147)
Total Bilirubin: 0.7 mg/dL (ref 0.3–1.2)
Total Protein: 4.7 g/dL — ABNORMAL LOW (ref 6.0–8.3)

## 2014-05-15 LAB — CBC
HCT: 23.6 % — ABNORMAL LOW (ref 39.0–52.0)
Hemoglobin: 7.4 g/dL — ABNORMAL LOW (ref 13.0–17.0)
MCH: 30.1 pg (ref 26.0–34.0)
MCHC: 31.4 g/dL (ref 30.0–36.0)
MCV: 95.9 fL (ref 78.0–100.0)
Platelets: 27 K/uL — CL (ref 150–400)
RBC: 2.46 MIL/uL — ABNORMAL LOW (ref 4.22–5.81)
RDW: 21.1 % — ABNORMAL HIGH (ref 11.5–15.5)
WBC: 3.4 K/uL — ABNORMAL LOW (ref 4.0–10.5)

## 2014-05-15 LAB — PREPARE RBC (CROSSMATCH)

## 2014-05-15 MED ORDER — DIPHENHYDRAMINE HCL 25 MG PO CAPS
25.0000 mg | ORAL_CAPSULE | Freq: Once | ORAL | Status: AC
  Administered 2014-05-15: 25 mg via ORAL
  Filled 2014-05-15: qty 1

## 2014-05-15 MED ORDER — SODIUM CHLORIDE 0.9 % IV SOLN
Freq: Once | INTRAVENOUS | Status: AC
  Administered 2014-05-15: 08:00:00 via INTRAVENOUS

## 2014-05-15 MED ORDER — FUROSEMIDE 10 MG/ML IJ SOLN
20.0000 mg | Freq: Once | INTRAMUSCULAR | Status: AC
  Administered 2014-05-15: 20 mg via INTRAVENOUS
  Filled 2014-05-15: qty 2

## 2014-05-15 MED ORDER — ACETAMINOPHEN 325 MG PO TABS
650.0000 mg | ORAL_TABLET | Freq: Once | ORAL | Status: AC
  Administered 2014-05-15: 650 mg via ORAL
  Filled 2014-05-15: qty 2

## 2014-05-15 NOTE — Progress Notes (Signed)
[name] still has significant hematuria in the Foley catheter. He apparently is going to have a procedure done in which I think a nephrostomy tube will be exchanged.  His platelet count is down to 27,000. Hemoglobin is 7.4.  He did get some iron back on December 14. This iron may have led to some decrease in his platelets. 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  I will have to give him some pleasant blood today. I want to make sure that his procedure is safe.  He feels good otherwise. He's not had any problems with nausea or vomiting. His appetite has been okay. There's been no cough. He's had no bleeding otherwise.  He is afebrile. Blood pressure is stable. Heart rate only 79.  Overall, there is no change in his physical exam.  We will continue to support him with transfusions.  I think platelets will be the best way to go.  Given that he has chronic renal insufficiency, I suspect that his erythropoietin level is on the low side. He probably would benefit from Procrit or Aranesp. I will check an erythropoietin level on him.  Lum Keas  Psalm 119:10

## 2014-05-15 NOTE — Progress Notes (Signed)
CRITICAL VALUE ALERT  Critical value received:  Platelets 27  Date of notification:  05/15/14  Time of notification: 1828  Critical value read back:Yes.    Nurse who received alert:  S.Young,RN  MD notified (1st page):  K. Schorr,NP  Time of first page:  0519  MD notified (2nd page):  Time of second page:  Responding MD:    Time MD responded:

## 2014-05-15 NOTE — Progress Notes (Signed)
Patient ID: [name], male   DOB: [birth year], 63 y.o.   MRN: [id number] 2 Days Post-Op  Subjective: [name] is without major complaints.  The foley drainage remains clear and the NT drainage, while bloody, is much clearer today.  His Platelet count has declined further to 27 and his hgb to 7.4.  Dr. Marin Olp was in per the patient and mentioned the need for additional blood products.   He is for a nephrostogram today with possible tube exchange.  ROS:  Review of Systems  Constitutional: Negative for fever.  Gastrointestinal: Negative for nausea and abdominal pain.    Anti-infectives: Anti-infectives    Start     Dose/Rate Route Frequency Ordered Stop   05/13/14 0800  [MAR Hold]  Ampicillin-Sulbactam (UNASYN) 3 g in sodium chloride 0.9 % 100 mL IVPB     (MAR Hold since 05/13/14 0728)   3 g100 mL/hr over 60 Minutes Intravenous  Once 05/13/14 0721 05/13/14 0803   05/08/14 1645  Ampicillin-Sulbactam (UNASYN) 3 g in sodium chloride 0.9 % 100 mL IVPB     3 g100 mL/hr over 60 Minutes Intravenous To Surgery 05/08/14 1631 05/08/14 1656   05/05/14 1300  amoxicillin (AMOXIL) capsule 250 mg     250 mg Oral Every 12 hours 05/05/14 1227     05/05/14 1215  amoxicillin (AMOXIL) chewable tablet 250 mg  Status:  Discontinued     250 mg Oral Every 12 hours 05/05/14 1214 05/05/14 1227   05/03/14 2200  vancomycin (VANCOCIN) IVPB 1000 mg/200 mL premix  Status:  Discontinued     1,000 mg200 mL/hr over 60 Minutes Intravenous Every 24 hours 05/03/14 2151 05/05/14 1214   05/03/14 1200  ceFAZolin (ANCEF) IVPB 2 g/50 mL premix     2 g100 mL/hr over 30 Minutes Intravenous On call 05/02/14 1254 05/03/14 1011   05/03/14 0914  ceFAZolin (ANCEF) 2-3 GM-% IVPB SOLR    Comments:  Shaaron Adler   : cabinet override      05/03/14 0914 05/03/14 2114      Current Facility-Administered Medications  Medication Dose Route Frequency Provider Last Rate Last Dose  . 0.9 %  sodium chloride infusion   Intravenous Once  Reyne Dumas, MD      . 0.9 %  sodium chloride infusion   Intravenous Continuous Reyne Dumas, MD 75 mL/hr at 05/14/14 1859    . 0.9 %  sodium chloride infusion   Intravenous Once Volanda Napoleon, MD      . acetaminophen (TYLENOL) tablet 650 mg  650 mg Oral Q6H PRN Nishant Dhungel, MD   650 mg at 05/03/14 1810  . acetaminophen (TYLENOL) tablet 650 mg  650 mg Oral Once Volanda Napoleon, MD      . amoxicillin (AMOXIL) capsule 250 mg  250 mg Oral Q12H Nishant Dhungel, MD   250 mg at 05/14/14 2221  . cyanocobalamin ((VITAMIN B-12)) injection 1,000 mcg  1,000 mcg Intramuscular Q30 days Shanda Howells, MD   1,000 mcg at 04/29/14 0947  . diphenhydrAMINE (BENADRYL) capsule 25 mg  25 mg Oral Once Volanda Napoleon, MD      . folic acid (FOLVITE) tablet 1 mg  1 mg Oral Daily Shanda Howells, MD   1 mg at 05/14/14 0904  . furosemide (LASIX) injection 20 mg  20 mg Intravenous Once Volanda Napoleon, MD      . HYDROcodone-acetaminophen (NORCO/VICODIN) 5-325 MG per tablet 1-2 tablet  1-2 tablet Oral Q6H PRN Shanda Howells, MD  2 tablet at 05/14/14 2221  . hydrocortisone cream 1 % 1 application  1 application Topical BID Shanda Howells, MD   1 application at 11/91/47 [phone]  . hydrocortisone cream 1 %   Topical TID PRN Carmin Muskrat, MD      . HYDROmorphone (DILAUDID) injection 2 mg  2 mg Intravenous Q4H PRN Nishant Dhungel, MD   2 mg at 05/13/14 1911  . nystatin (MYCOSTATIN/NYSTOP) topical powder   Topical BID PRN Nishant Dhungel, MD      . ondansetron (ZOFRAN) tablet 8 mg  8 mg Oral BID PRN Shanda Howells, MD      . pantoprazole (PROTONIX) EC tablet 40 mg  40 mg Oral Daily Shanda Howells, MD   40 mg at 05/14/14 0904  . sodium chloride 0.9 % injection 10-40 mL  10-40 mL Intracatheter PRN Nishant Dhungel, MD   20 mL at 05/08/14 0550  . sodium chloride 0.9 % injection 10-40 mL  10-40 mL Intracatheter PRN Malka So, MD   10 mL at 05/14/14 6213  . sodium chloride 0.9 % injection 3 mL  3 mL Intravenous Q12H Shanda Howells,  MD   3 mL at 05/14/14 2222  . zolpidem (AMBIEN) tablet 5 mg  5 mg Oral QHS PRN Eugenie Filler, MD   5 mg at 05/14/14 2221   Facility-Administered Medications Ordered in Other Encounters  Medication Dose Route Frequency Provider Last Rate Last Dose  . heparin lock flush 100 unit/mL  500 Units Intravenous Once Pieter Partridge, MD      . sodium chloride 0.9 % injection 10 mL  10 mL Intravenous PRN Pieter Partridge, MD   10 mL at 07/17/12 1113     Objective: Vital signs in last 24 hours: Temp:  [98.1 F (36.7 C)-98.8 F (37.1 C)] 98.1 F (36.7 C) (12/17 0524) Pulse Rate:  [69-79] 79 (12/17 0524) Resp:  [16-18] 16 (12/17 0524) BP: (110-117)/(48-56) 117/56 mmHg (12/17 0524) SpO2:  [96 %-100 %] 100 % (12/17 0524) Weight:  [102.2 kg (225 lb 5 oz)] 102.2 kg (225 lb 5 oz) (12/17 0641)  Intake/Output from previous day: 12/16 0701 - 12/17 0700 In: 1805 [P.O.:700; I.V.:1105] Out: 1400 [Urine:800; Stool:600] Intake/Output this shift:   He has good output from both the foley and NT.     Physical Exam  Constitutional: He is well-developed, well-nourished, and in no distress.  Genitourinary:  Nephrostomy drainage is now cleared a light cherry red from a thick red.   Foley drainage remains clear.     Lab Results:   Recent Labs  05/14/14 0629 05/15/14 0350  WBC 6.2 3.4*  HGB 7.9* 7.4*  HCT 25.0* 23.6*  PLT 31* 27*   BMET  Recent Labs  05/14/14 0629 05/15/14 0350  NA 138 136*  K 4.5 3.9  CL 110 108  CO2 20 19  GLUCOSE 105* 77  BUN 17 16  CREATININE 1.93* 2.00*  CALCIUM 8.0* 8.0*   PT/INR  Recent Labs  05/13/14 0515  LABPROT 13.9  INR 1.06   ABG No results for input(s): PHART, HCO3 in the last 72 hours.  Invalid input(s): PCO2, PO2  Studies/Results: Ct Abdomen Pelvis Wo Contrast  05/14/2014   CLINICAL DATA:  Hematuria. Left nephrolithotomy yesterday. Renal calculus.  EXAM: CT ABDOMEN AND PELVIS WITHOUT CONTRAST  TECHNIQUE: Multidetector CT imaging of the  abdomen and pelvis was performed following the standard protocol without IV contrast.  COMPARISON:  Multiple exams, including 04/28/2014  FINDINGS: Lower chest: Small  subpleural nodules in the right middle lobe measure up to 4 mm. Trace bilateral pleural effusions. Low-density blood pool suggests anemia. Lymph node with fatty hilum adjacent to the distal esophagus. Small epicardial lymph nodes are present. Coronary atherosclerosis.  Hepatobiliary: Small size the lateral segment left hepatic lobe with irregular high density within an along segment 3 of the liver, significance uncertain, but I favor cirrhosis. Possible recanalized umbilical vein.  Pancreas: Unremarkable  Spleen: Mild splenomegaly, splenic volume 600 cc. Mild prominence of the splenic vein.  Adrenals/Urinary Tract: Bilateral hypodense renal lesions favoring cysts. Large nephrostomy tube noted with high density fluid in the left collecting system and in internal stent or tube tracking through the nephrostomy tube and down the left ureter to the urinary bladder. The prior 1.5 cm left collecting system calculus is no longer present. No left ureteral calculus observed. There is a questionable 1-2 mm left mid kidney calculus on image 34 of series 2. Stable calculi air observed in the right renal collecting system, largest 0.8 cm in long axis on image 33 of series 2. No right hydronephrosis or hydroureter. Both ureters deviated medially towards the cicatricial reaction and what may be a rectal stump. A Foley catheter is present in the urinary bladder which demonstrates nondistention and mild wall thickening which may primarily be due to the nondistention.  Stomach/Bowel: Complicated appearance in the pelvis. 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 Much of  this could be from a prior radiation therapy and appear similar to prior exams ; there are loops of bowel adjacent to the into the rectal stump and can activity is difficult to completely exclude, correlate with operative history. There is some narrowing or cicatricial reaction near the is suspected and of the rectal stump, drying the ureters medially.  There also some scattered air-fluid levels in nondilated loops of small bowel. Anastomotic staple line noted below the umbilicus along a loop of central abdominal bowel.  Vascular/Lymphatic: Aortoiliac atherosclerotic vascular disease.  Reproductive: Mild enlargement and marginal indistinctness of the prostate gland, although this is chronic and suspected the marginal indistinctness is due to the generalized perirectal stranding.  Other: Scattered mild ascites. Diffuse low-grade mesenteric edema. Considerable perirectal stranding/edema.  Musculoskeletal: Mild anterior wedging of T11 and T12 appears chronic. Degenerative facet arthropathy on the right at L4-5.  IMPRESSION: 1. High density fluid in the left collecting system could represent some residual contrast although blood in the collecting system is certainly a differential diagnostic consideration. Prior large left collecting system and left proximal ureteral calculi are no longer present; there is left nephrostomy tube and a stent extending down to the urinary bladder. No significant renal capsular hematoma; there is right nonobstructive nephrolithiasis and bilateral renal cysts. 2. Hepatic cirrhosis with splenomegaly. 3. Extensive perirectal stranding, similar to prior, with some cicatricial reaction around what is thought to be the end of a rectal stump causing medial deviation of the ureters. 4. 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 There is scattered air- fluid levels in relatively nondilated loops of small bowel, along with a small amount of ascites and small bilateral pleural  effusions. 5. Low-density blood pool compatible with anemia 6. Additional findings include small subpleural nodules in the right middle lobe which seems stable ; Coronary atherosclerosis ; and diffuse low-grade mesenteric edema.   Electronically Signed  By: Sherryl Barters M.D.   On: 05/14/2014 09:40   Dg C-arm Gt 120 Min-no Report  05/13/2014   CLINICAL DATA: kidney stones   C-ARM GT 120 MINUTE  Fluoroscopy was utilized by the requesting physician.  No radiographic  interpretation.      Assessment: s/p Procedure(s): LEFT NEPHROLITHOTOMY PERCUTANEOUS FIRST STAGE HOLMIUM LASER APPLICATION  He is essentially stone free post PCNL and the bleeding from the perc tube is declining with good urine output.    His Plt count and Hgb have declined again.  Plan: Antegrade nephrostogram today with possible conversion to a nephroureteral catheter vs loop nephrostomy depending on the outcome.   He will probably need additional blood products but I will defer to Dr. Marin Olp.   He will need another voiding trial, possibly tomorrow morning depending on the nephrostogram and tube replacement timing.        LOS: 17 days    Birt Reinoso J 05/15/2014

## 2014-05-15 NOTE — Progress Notes (Addendum)
[facility] PROGRESS NOTE  JAISHON KRISHER ZOX:[id number] DOB: 15-Mar-1951 DOA: 04/28/2014 PCP: [name], MD    Brief narrative 63 year old male with history of metastatic colon cancer receiving chemotherapy in [place], anemia, coronary artery disease, history of hydronephrosis status post stenting in April 2015, nephrolithiasis, chronic kidney disease stage III who was sent to the ED from his hematology office. Patient reports intermittent hematuria for of about 2-3 weeks duration. Few days back he thought he passed his ureteral stent while using the bathroom. When he presented to his oncologist for chemotherapy he was found to have progressively worsened renal function. His last normal creatinine was 1.67 about one month back which worsened to 7.1 on 11/16. He was followed up again on 11/23 by his oncologist and his creatinine had worsened to 12.47. A renal ultrasound was done which showed moderate bilateral hydronephrosis with nephrocalcinosis. On 11/30 he was found to have bilateral lower extremity purpura and increased leg swellings. Blood work done showed platelets of 52,000 and creatinine of 18.4. He was also found to be in metabolic acidosis with CO2 9 with high anion gap.  A CT scan of the abdomen done in 11/30 showed increasing moderate to severe right hydronephrosis and severe left hydronephrosis. Also showed a 0.9x0.2 cm calculus at the left UPJ and unchanged multiple bilateral renal calculi  Urology was consulted and recommended admission to [facility] initially but [facility] was [facility] for [facility].   Patient continues to have hematuria, despite the irrigation. Plan for transferring the patient to [place] for cystoscopy with ureteroscopy for evaluation of the bleeding.     Assessment/Plan: Acute on chronic kidney disease stage III secondary to obstructive uropathy with severe metabolic acidosis  -AG Metabolic acidosis resolved with improvement  in renal fn and bicarb drip . Renal fn improving after foley placed on 11/30. -bedside cystoscopy performed by Dr Jeffie Pollock on 12/3 showing a large amount of old and fresh clot that appears to have come from the left collecting system. There was a clot protruding from the ureteral orifice on the left which was left as is. S/p evacuation of the bladder clots with a 66fr [place] foley. His prostate was short and minimally obstructing and the bladder wall had no evidence of cancer or stones. Foley was removed but had to be placed again on 12/4 due to urinary retention. -most likely source of bleeding are the left ureteral and renal stones,. IR placed rt sided PCN. Still having hematuria. reportedly has blood clots with difficulty flushing out. Continued bladder irrigation. Despite irrigation, he continued to have hematuria and required another unit of prbc transfusion on 12/9. He was [facility] for cystoscopy with ureteroscopy to better assess the source of bleeding  Status post cystoscopy with evacuation of clots 12/10, left ureterocopy,  CBI  discontinued 12/11, Foley discontinued but replaced again and voiding trial in a month percutaneous stone treatment on 12/15, status post nephrostomy tube placement  Creatinine slowly improving but patient continues to have hematuria in the setting of a thrombocytopenia with a platelet count of 27 He is for a nephrostogram today with possible tube exchange.     Severe Anemia and thrombocytopenia Secondary to active hematuria and blood clots. Also contributed by chemotherapy and metastatic colon cancer.  Patient has been receiving both PRBC and platelets. He has ongoing thrombocytopenia possibly related to chemotherapy and liver cirrhosis and improving minimally with transfusion.. Patient has received 11 u PRBC since this  admission .  6 units PRBC prior to this admission.Has received 2 units of platelets patient has received 2 units of  platelets -Received IV dextran infusion on 12/7.  Transfused 2 U platelet prior to procedure 12/15 Patient continues to have hematuria, hematology recommend platelet transfusion and packed red blood cell transfusion today Appreciate hematology input      Sepsis with enterococcus bacteriuria Patient febrile and hypotensive on the evening percutaneous nephrostomy was done (12/5). Urine culture from the percutaneous site growing enterococcus. Currently on amoxicillin Blood culture with no growth to date. Spoke with ID consult Dr. Megan Salon and recommend switching to oral amoxicillin and to continue antibiotic until he has his for lithotripsy done to avoid further sepsis. Since lithotripsy is planned for next week we will treat him for a 2 weeks course. 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 Appreciate recommendations. Follows with oncology in [place].  Palpable purpura Noted HSP/ plantar erythrodysthesia. Reports to be due to chemotherapy Apply topical cream  Protein calorie malnutrition, severe Nutrition consult  B/l leg edema Prn lasix  Mild hypokalemia  Will be repleted as needed. Repeat bmp ordered showed resolution of hypokalemia    DVT prophylaxis: SCD  -Code Status: full code family Communication: none at bedside Disposition Plan: Patient remains inpatient until okay to discharge by urology, hematology    Consultants:  Dr Jeffie Pollock  Renal ( signed off)  IR  hematology  Procedures:  Cystoscopy on 12/3  left percutaneous nephrostomy on 12/5 12/11:  1. Cystoscopy with evacuation of clots.             2. Left retrograde pyelogram with interpretation.             3. Left ureteroscopy.  Antibiotics:  IV vanco 12/5--12/7  Oral  Amoxicillin 12/7-- until 12/21  Subjective Patient has bloody drainage from the nephrostomy  tube, hemodynamically stable  Objective: Filed Vitals:   05/15/14 1205  BP: 133/58  Pulse: 75  Temp: 98.2 F (36.8 C)  Resp: 20    Intake/Output Summary (Last 24 hours) at 05/15/14 1241 Last data filed at 05/15/14 1025  Gross per 24 hour  Intake   1610 ml  Output   1400 ml  Net    210 ml   Filed Weights   05/14/14 0025 05/14/14 0500 05/15/14 0641  Weight: 100.789 kg (222 lb 3.2 oz) 100.699 kg (222 lb) 102.2 kg (225 lb 5 oz)    Exam:   General: Lesion male in no acute distress  HEENT: Pallor present, moist mucosa  Chest: Clear breath sounds bilaterally, left Port-A-Cath  CVS: Normal S1-S2, no murmurs  Abdomen: Soft, nondistended, nontender, ileostomy status, left percutaneous nephrostomy draining bloody urine, Foley with blood mixed urine  Extremities: Palpable purpura over the dorsum of bilateral feet, trace edema bilaterally  CNS: Alert and oriented  Data Reviewed: Basic Metabolic Panel:  Recent Labs Lab 05/10/14 0505 05/11/14 0835 05/12/14 0310 05/14/14 0629 05/15/14 0350  NA 141 138 137 138 136*  K 3.7 3.5* 3.5* 4.5 3.9  CL 110 109 110 110 108  CO2 21 18* 19 20 19   GLUCOSE 91 115* 79 105* 77  BUN 27* 19 15 17 16   CREATININE 2.11* 1.83* 1.86* 1.93* 2.00*  CALCIUM 8.2* 8.0* 8.1* 8.0* 8.0*   Liver Function Tests:  Recent Labs Lab 05/10/14 0505 05/12/14 0310 05/14/14 0629 05/15/14 0350  AST 30 42* 26 22  ALT  46 53 38 34  ALKPHOS 104 111 110 101  BILITOT 1.2 0.8 0.7 0.7  PROT 5.1* 5.0* 4.9* 4.7*  ALBUMIN 2.2* 2.1* 2.1* 2.0*   No results for input(s): LIPASE, AMYLASE in the last 168 hours. No results for input(s): AMMONIA in the last 168 hours. CBC:  Recent Labs Lab 05/12/14 0310 05/13/14 0515 05/13/14 1120 05/14/14 0629 05/15/14 0350  WBC 2.7* 3.4* 2.8* 6.2 3.4*  HGB 8.5* 9.9* 9.0* 7.9* 7.4*  HCT 26.7* 29.9* 28.2* 25.0* 23.6*  MCV 91.8 93.1 94.0 94.3 95.9  PLT 33* 40* 39* 31* 27*   Cardiac Enzymes: No results for input(s):  CKTOTAL, CKMB, CKMBINDEX, TROPONINI in the last 168 hours. BNP (last 3 results) No results for input(s): PROBNP in the last 8760 hours. CBG:  Recent Labs Lab 05/13/14 0733 05/13/14 0922 05/13/14 1108  GLUCAP 66* 90 91    Recent Results (from the past 240 hour(s))  MRSA PCR Screening     Status: None   Collection Time: 05/13/14  5:25 AM  Result Value Ref Range Status   MRSA by PCR NEGATIVE NEGATIVE Final    Comment:        The GeneXpert MRSA Assay (FDA approved for NASAL specimens only), is one component of a comprehensive MRSA colonization surveillance program. It is not intended to diagnose MRSA infection nor to guide or monitor treatment for MRSA infections.      Studies: Ct Abdomen Pelvis Wo Contrast  05/14/2014   CLINICAL DATA:  Hematuria. Left nephrolithotomy yesterday. Renal calculus.  EXAM: CT ABDOMEN AND PELVIS WITHOUT CONTRAST  TECHNIQUE: Multidetector CT imaging of the abdomen and pelvis was performed following the standard protocol without IV contrast.  COMPARISON:  Multiple exams, including 04/28/2014  FINDINGS: Lower chest: Small subpleural nodules in the right middle lobe measure up to 4 mm. Trace bilateral pleural effusions. Low-density blood pool suggests anemia. Lymph node with fatty hilum adjacent to the distal esophagus. Small epicardial lymph nodes are present. Coronary atherosclerosis.  Hepatobiliary: Small size the lateral segment left hepatic lobe with irregular high density within an along segment 3 of the liver, significance uncertain, but I favor cirrhosis. Possible recanalized umbilical vein.  Pancreas: Unremarkable  Spleen: Mild splenomegaly, splenic volume 600 cc. Mild prominence of the splenic vein.  Adrenals/Urinary Tract: Bilateral hypodense renal lesions favoring cysts. Large nephrostomy tube noted with high density fluid in the left collecting system and in internal stent or tube tracking through the nephrostomy tube and down the left ureter to the  urinary bladder. The prior 1.5 cm left collecting system calculus is no longer present. No left ureteral calculus observed. There is a questionable 1-2 mm left mid kidney calculus on image 34 of series 2. Stable calculi air observed in the right renal collecting system, largest 0.8 cm in long axis on image 33 of series 2. No right hydronephrosis or hydroureter. Both ureters deviated medially towards the cicatricial reaction and what may be a rectal stump. A Foley catheter is present in the urinary bladder which demonstrates nondistention and mild wall thickening which may primarily be due to the nondistention.  Stomach/Bowel: Complicated appearance in the pelvis. 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 Much of this could be from a prior radiation therapy and appear similar to prior exams ; there are loops of bowel adjacent to the  into the rectal stump and can activity is difficult to completely exclude, correlate with operative history. There is some narrowing or cicatricial reaction near the is suspected and of the rectal stump, drying the ureters medially.  There also some scattered air-fluid levels in nondilated loops of small bowel. Anastomotic staple line noted below the umbilicus along a loop of central abdominal bowel.  Vascular/Lymphatic: Aortoiliac atherosclerotic vascular disease.  Reproductive: Mild enlargement and marginal indistinctness of the prostate gland, although this is chronic and suspected the marginal indistinctness is due to the generalized perirectal stranding.  Other: Scattered mild ascites. Diffuse low-grade mesenteric edema. Considerable perirectal stranding/edema.  Musculoskeletal: Mild anterior wedging of T11 and T12 appears chronic. Degenerative facet arthropathy on the right at  L4-5.  IMPRESSION: 1. High density fluid in the left collecting system could represent some residual contrast although blood in the collecting system is certainly a differential diagnostic consideration. Prior large left collecting system and left proximal ureteral calculi are no longer present; there is left nephrostomy tube and a stent extending down to the urinary bladder. No significant renal capsular hematoma; there is right nonobstructive nephrolithiasis and bilateral renal cysts. 2. Hepatic cirrhosis with splenomegaly. 3. Extensive perirectal stranding, similar to prior, with some cicatricial reaction around what is thought to be the end of a rectal stump causing medial deviation of the ureters. 4. 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 There is scattered air- fluid levels in relatively nondilated loops of small bowel, along with a small amount of ascites and small bilateral pleural effusions. 5. Low-density blood pool compatible with anemia 6. Additional findings include small subpleural nodules in the right middle lobe which seems stable ; Coronary atherosclerosis ; and diffuse low-grade mesenteric edema.   Electronically Signed   By: Sherryl Barters M.D.   On: 05/14/2014 09:40    Scheduled Meds: . sodium chloride   Intravenous Once  . amoxicillin  250 mg Oral Q12H  . cyanocobalamin  1,000 mcg Intramuscular Q30 days  . folic acid  1 mg Oral Daily  . hydrocortisone cream  1 application Topical BID  . pantoprazole  40 mg Oral Daily  . sodium chloride  3 mL Intravenous Q12H   Continuous Infusions: . sodium chloride 75 mL/hr at 05/14/14 1859     Time spent: 25 minutes    [facility]  [facility] Pager [phone]  If 7PM-7AM, please contact night-coverage at [email or web address], password [facility] 05/15/2014, 12:41 PM  LOS: 17 days

## 2014-05-16 ENCOUNTER — Inpatient Hospital Stay (HOSPITAL_COMMUNITY): Payer: Managed Care, Other (non HMO)

## 2014-05-16 LAB — PREPARE PLATELET PHERESIS
UNIT DIVISION: 0
Unit division: 0

## 2014-05-16 LAB — COMPREHENSIVE METABOLIC PANEL
ALK PHOS: 115 U/L (ref 39–117)
ALT: 31 U/L (ref 0–53)
ANION GAP: 8 (ref 5–15)
AST: 25 U/L (ref 0–37)
Albumin: 2.1 g/dL — ABNORMAL LOW (ref 3.5–5.2)
BILIRUBIN TOTAL: 1.6 mg/dL — AB (ref 0.3–1.2)
BUN: 18 mg/dL (ref 6–23)
CHLORIDE: 113 meq/L — AB (ref 96–112)
CO2: 21 meq/L (ref 19–32)
CREATININE: 1.9 mg/dL — AB (ref 0.50–1.35)
Calcium: 8 mg/dL — ABNORMAL LOW (ref 8.4–10.5)
GFR, EST AFRICAN AMERICAN: 42 mL/min — AB (ref >90)
GFR, EST NON AFRICAN AMERICAN: 36 mL/min — AB (ref >90)
GLUCOSE: 73 mg/dL (ref 70–99)
POTASSIUM: 4.1 meq/L (ref 3.7–5.3)
Sodium: 142 mEq/L (ref 137–147)
Total Protein: 4.9 g/dL — ABNORMAL LOW (ref 6.0–8.3)

## 2014-05-16 LAB — TYPE AND SCREEN
ABO/RH(D): A POS
Antibody Screen: NEGATIVE
UNIT DIVISION: 0
Unit division: 0

## 2014-05-16 LAB — CBC
HEMATOCRIT: 27.5 % — AB (ref 39.0–52.0)
HEMOGLOBIN: 8.9 g/dL — AB (ref 13.0–17.0)
MCH: 30.1 pg (ref 26.0–34.0)
MCHC: 32.4 g/dL (ref 30.0–36.0)
MCV: 92.9 fL (ref 78.0–100.0)
Platelets: 37 10*3/uL — ABNORMAL LOW (ref 150–400)
RBC: 2.96 MIL/uL — AB (ref 4.22–5.81)
RDW: 19.8 % — ABNORMAL HIGH (ref 11.5–15.5)
WBC: 3 10*3/uL — AB (ref 4.0–10.5)

## 2014-05-16 LAB — STONE ANALYSIS: Stone Weight KSTONE: 0.17 g

## 2014-05-16 MED ORDER — MIDAZOLAM HCL 2 MG/2ML IJ SOLN
INTRAMUSCULAR | Status: AC | PRN
  Administered 2014-05-16: 2 mg via INTRAVENOUS

## 2014-05-16 MED ORDER — CIPROFLOXACIN IN D5W 400 MG/200ML IV SOLN
INTRAVENOUS | Status: AC
  Administered 2014-05-16: 400 mg
  Filled 2014-05-16: qty 200

## 2014-05-16 MED ORDER — IOHEXOL 300 MG/ML  SOLN
10.0000 mL | Freq: Once | INTRAMUSCULAR | Status: AC | PRN
  Administered 2014-05-16: 35 mL

## 2014-05-16 MED ORDER — FENTANYL CITRATE 0.05 MG/ML IJ SOLN
INTRAMUSCULAR | Status: AC
  Filled 2014-05-16: qty 6

## 2014-05-16 MED ORDER — MIDAZOLAM HCL 2 MG/2ML IJ SOLN
INTRAMUSCULAR | Status: AC
  Filled 2014-05-16: qty 6

## 2014-05-16 MED ORDER — PERMETHRIN 1 % EX LOTN
TOPICAL_LOTION | Freq: Once | CUTANEOUS | Status: AC
  Administered 2014-05-16: 1 via TOPICAL
  Filled 2014-05-16: qty 59

## 2014-05-16 MED ORDER — SODIUM CHLORIDE 0.9 % IV SOLN
1.0000 g/h | Freq: Once | INTRAVENOUS | Status: AC
  Administered 2014-05-16: 1 g/h via INTRAVENOUS
  Filled 2014-05-16: qty 20

## 2014-05-16 MED ORDER — LIDOCAINE HCL 1 % IJ SOLN
INTRAMUSCULAR | Status: AC
  Filled 2014-05-16: qty 20

## 2014-05-16 MED ORDER — FENTANYL CITRATE 0.05 MG/ML IJ SOLN
INTRAMUSCULAR | Status: AC | PRN
  Administered 2014-05-16 (×2): 50 ug via INTRAVENOUS

## 2014-05-16 NOTE — Progress Notes (Signed)
Per rn in radiology, she suspects pt has lice from her assessment while pt was in IR fro a procedure. MD made aware. [name] will consult with ID for consult. Pt made aware. Pt denies any hx of lice/nix. Denies itching or crawling sensation to scalp. Pt reports severe dandruff. Awaiting ID consult. Pt in compliance with consult. Vwilliams,rn.

## 2014-05-16 NOTE — Procedures (Signed)
Interventional Radiology Procedure Note  Procedure: Antegrade nephrostomy.  Exchange of MPA catheter in bladder for new 26cm PCNU.   Findings: Multiple filling defects in the collecting system = clots. New 26cm PCNU into the bladder. Distal ureteral obstruction. Discussed findings and plan with Dr. Jeffie Pollock intra-procedural.   Complications: No immediate Recommendations:  - Continue the red rubber catheter to drainage bag.  - Cap the PCNU, with foley in place draining the bladder.  - Follow resolution of hematuria.   Signed,  Dulcy Fanny. Earleen Newport, DO

## 2014-05-16 NOTE — Progress Notes (Signed)
PT Cancellation Note  Patient Details Name: [name] MRN: [id number] DOB: 10/01/1950   Cancelled Treatment:    Reason Eval/Treat Not Completed: Patient at procedure or test/unavailable. Pt just arrived back to room after having procedure. Pt requested PT check back tomorrow. Explained to pt that we will try to see IF we are able. Thanks.    Weston Anna, MPT Pager: [phone]

## 2014-05-16 NOTE — Progress Notes (Addendum)
[facility] PROGRESS NOTE  [name]:[id number] DOB: 07-Nov-1950 DOA: 04/28/2014 PCP: [name], MD    Brief narrative 63 year old male with history of metastatic colon cancer receiving chemotherapy in [place], anemia, coronary artery disease, history of hydronephrosis status post stenting in April 2015, nephrolithiasis, chronic kidney disease stage III who was sent to the ED from his hematology office. Patient reports intermittent hematuria for of about 2-3 weeks duration. Few days back he thought he passed his ureteral stent while using the bathroom. When he presented to his oncologist for chemotherapy he was found to have progressively worsened renal function. His last normal creatinine was 1.67 about one month back which worsened to 7.1 on 11/16. He was followed up again on 11/23 by his oncologist and his creatinine had worsened to 12.47. A renal ultrasound was done which showed moderate bilateral hydronephrosis with nephrocalcinosis. On 11/30 he was found to have bilateral lower extremity purpura and increased leg swellings. Blood work done showed platelets of 52,000 and creatinine of 18.4. He was also found to be in metabolic acidosis with CO2 9 with high anion gap.  A CT scan of the abdomen done in 11/30 showed increasing moderate to severe right hydronephrosis and severe left hydronephrosis. Also showed a 0.9x0.2 cm calculus at the left UPJ and unchanged multiple bilateral renal calculi  Urology was consulted and recommended admission to [facility] initially but [facility] was [facility].  Patient has had multiple procedures done  Since his admission. List of procedures that were done all listed as below.   bottom line is that the patientcontinues to have hematuria, despite the irrigation. Continues to receive platelets and blood. Hematology to decide  Further management.      Assessment/Plan: Acute on chronic kidney disease stage III  secondary to obstructive uropathy with severe metabolic acidosis  -AG Metabolic acidosis resolved with improvement in renal fn and bicarb drip . Renal fn improving after foley placed on 11/30. -bedside cystoscopy performed by Dr Jeffie Pollock on 12/3 showing a large amount of old and fresh clot that appears to have come from the left collecting system. There was a clot protruding from the ureteral orifice on the left which was left as is. S/p evacuation of the bladder clots with a 32fr [place] foley. His prostate was short and minimally obstructing and the bladder wall had no evidence of cancer or stones. Foley was removed but had to be placed again on 12/4 due to urinary retention. -most likely source of bleeding are the left ureteral and renal stones,. IR placed rt sided PCN. Still having hematuria. reportedly has blood clots with difficulty flushing out.  Marland Kitchen Despite irrigation, he continued to have hematuria and required  prbc transfusion on 12/9. He was transferred to [facility] long for cystoscopy with ureteroscopy to better assess the source of bleeding  Status post cystoscopy with evacuation of clots 12/10, left ureterocopy,  CBI  discontinued 12/11, Foley discontinued but replaced again and voiding trial in a month percutaneous stone treatment on 12/15, status post nephrostomy tube placement  on 12/16 patient had percutaneous nephrolithotomy greater than 2 cm stone burden. Left antegrade ureteroscopic stone extraction with holmium laser application. Left antegrade nephrostogram with interpretation   on 12/18   Patient is anticipated to have   Nephroureteral catheter placement  Creatinine slowly improving but patient continues to have hematuria in the setting of a thrombocytopenia with a platelet count of 37  Today. He is for a nephrostogram today with possible  tube exchange.     Severe Anemia and thrombocytopenia   patient has had 20 units of packed red blood cells and 11 units of platelets  since  This admission Secondary to active hematuria and blood clots. Also contributed by chemotherapy and metastatic colon cancer.  Received IV dextran infusion on 12/7.  patient continues to have hematuria, hematology  Making recommendations about platelet transfusion and packed red blood cell transfusion Appreciate hematology input  patient started on Amicar infusion 12/18.  He may need PO   Amicar 500 mg by mouth every 8 hours. At the time of discharge     Sepsis with enterococcus bacteriuria Patient febrile and hypotensive on the evening percutaneous nephrostomy was done (12/5). Urine culture from the percutaneous site growing enterococcus. Currently on amoxicillin Blood culture with no growth to date. Spoke with ID consult Dr. Megan Salon and recommend switching to oral amoxicillin and to continue antibiotic until he has his for lithotripsy done to avoid further sepsis. Since lithotripsy is planned for next week we will treat him for a 2 weeks course. 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 Appreciate recommendations. Follows with oncology in [place].  Palpable purpura Noted HSP/ plantar erythrodysthesia. Reports to be due to chemotherapy Apply topical cream  Protein calorie malnutrition, severe Nutrition consult  B/l leg edema Prn lasix  Mild hypokalemia  Will be repleted as needed. Repeat bmp ordered showed resolution of hypokalemia    DVT prophylaxis: SCD  -Code Status: full code family Communication: none at bedside Disposition Plan: Patient remains inpatient until okay to discharge by urology, hematology    Consultants:  Dr Jeffie Pollock  Renal ( signed off)  IR  hematology  Procedures:  Cystoscopy on 12/3  left percutaneous nephrostomy on 12/5 12/11:  1. Cystoscopy with evacuation of clots.             2. Left retrograde  pyelogram with interpretation.             3. Left ureteroscopy.  Antibiotics:  IV vanco 12/5--12/7  Oral  Amoxicillin 12/7-- until 12/21  Subjective Patient has bloody drainage from the nephrostomy tube, hemodynamically stable  Objective: Filed Vitals:   05/16/14 0506  BP: 125/61  Pulse: 74  Temp: 98.3 F (36.8 C)  Resp: 18    Intake/Output Summary (Last 24 hours) at 05/16/14 1257 Last data filed at 05/16/14 1100  Gross per 24 hour  Intake   2735 ml  Output   2825 ml  Net    -90 ml   Filed Weights   05/14/14 0500 05/15/14 0641 05/16/14 0506  Weight: 100.699 kg (222 lb) 102.2 kg (225 lb 5 oz) 102.6 kg (226 lb 3.1 oz)    Exam:   General: Lesion male in no acute distress  HEENT: Pallor present, moist mucosa  Chest: Clear breath sounds bilaterally, left Port-A-Cath  CVS: Normal S1-S2, no murmurs  Abdomen: Soft, nondistended, nontender, ileostomy status, left percutaneous nephrostomy draining bloody urine, Foley with blood mixed urine  Extremities: Palpable purpura over the dorsum of bilateral feet, trace edema bilaterally  CNS: Alert and oriented  Data Reviewed: Basic Metabolic Panel:  Recent Labs Lab 05/11/14 0835 05/12/14 0310 05/14/14 0629 05/15/14 0350 05/16/14 0510  NA 138 137 138 136* 142  K 3.5* 3.5* 4.5 3.9 4.1  CL 109 110 110 108 113*  CO2 18* 19 20 19 21   GLUCOSE 115* 79 105*  77 73  BUN 19 15 17 16 18   CREATININE 1.83* 1.86* 1.93* 2.00* 1.90*  CALCIUM 8.0* 8.1* 8.0* 8.0* 8.0*   Liver Function Tests:  Recent Labs Lab 05/10/14 0505 05/12/14 0310 05/14/14 0629 05/15/14 0350 05/16/14 0510  AST 30 42* 26 22 25   ALT 46 53 38 34 31  ALKPHOS 104 111 110 101 115  BILITOT 1.2 0.8 0.7 0.7 1.6*  PROT 5.1* 5.0* 4.9* 4.7* 4.9*  ALBUMIN 2.2* 2.1* 2.1* 2.0* 2.1*   No results for input(s): LIPASE, AMYLASE in the last 168 hours. No results for input(s): AMMONIA in the last 168 hours. CBC:  Recent Labs Lab 05/13/14 0515 05/13/14 1120  05/14/14 0629 05/15/14 0350 05/16/14 0510  WBC 3.4* 2.8* 6.2 3.4* 3.0*  HGB 9.9* 9.0* 7.9* 7.4* 8.9*  HCT 29.9* 28.2* 25.0* 23.6* 27.5*  MCV 93.1 94.0 94.3 95.9 92.9  PLT 40* 39* 31* 27* 37*   Cardiac Enzymes: No results for input(s): CKTOTAL, CKMB, CKMBINDEX, TROPONINI in the last 168 hours. BNP (last 3 results) No results for input(s): PROBNP in the last 8760 hours. CBG:  Recent Labs Lab 05/13/14 0733 05/13/14 0922 05/13/14 1108  GLUCAP 66* 90 91    Recent Results (from the past 240 hour(s))  MRSA PCR Screening     Status: None   Collection Time: 05/13/14  5:25 AM  Result Value Ref Range Status   MRSA by PCR NEGATIVE NEGATIVE Final    Comment:        The GeneXpert MRSA Assay (FDA approved for NASAL specimens only), is one component of a comprehensive MRSA colonization surveillance program. It is not intended to diagnose MRSA infection nor to guide or monitor treatment for MRSA infections.   Stone analysis     Status: None   Collection Time: 05/13/14 12:30 PM  Result Value Ref Range Status   Nidus Not observed  Final   Component 1 KSTONE See Below  Final    Comment: (NOTE) Uric Acid Dihydrate  80% Calcium Oxalate Monohydrate ([place]) 20%    Stone Weight KSTONE 0.1700 g Final    Comment: Performed at [facility]     Studies: No results found.  Scheduled Meds: . sodium chloride   Intravenous Once  . amoxicillin  250 mg Oral Q12H  . cyanocobalamin  1,000 mcg Intramuscular Q30 days  . folic acid  1 mg Oral Daily  . hydrocortisone cream  1 application Topical BID  . pantoprazole  40 mg Oral Daily  . sodium chloride  3 mL Intravenous Q12H   Continuous Infusions: . sodium chloride 75 mL/hr at 05/16/14 2979     Time spent: 25 minutes    [facility]  [facility] Pager [phone]  If 7PM-7AM, please contact night-coverage at [email or web address], password [facility] 05/16/2014, 12:57 PM  LOS: 18 days

## 2014-05-16 NOTE — Progress Notes (Signed)
3 Days Post-Op Subjective: Patient reports no significant pain. He has had no clots in his urine  Objective: Vital signs in last 24 hours: Temp:  [97.4 F (36.3 C)-98.7 F (37.1 C)] 98.3 F (36.8 C) (12/18 0506) Pulse Rate:  [65-79] 74 (12/18 0506) Resp:  [18-20] 18 (12/18 0506) BP: (121-143)/(48-78) 125/61 mmHg (12/18 0506) SpO2:  [98 %-100 %] 99 % (12/18 0506) Weight:  [102.2 kg (225 lb 5 oz)-102.6 kg (226 lb 3.1 oz)] 102.6 kg (226 lb 3.1 oz) (12/18 0506)  Intake/Output from previous day: 12/17 0701 - 12/18 0700 In: 3298 [P.O.:600; I.V.:1800; Blood:898] Out: 2775 [Urine:2200; Stool:575] Intake/Output this shift: Total I/O In: 1800 [I.V.:1800] Out: 1325 [Urine:900; Stool:425]  Physical Exam:  Constitutional: Vital signs reviewed. WD WN in NAD   Eyes: PERRL, No scleral icterus.     Lab Results:  Recent Labs  05/14/14 0629 05/15/14 0350 05/16/14 0510  HGB 7.9* 7.4* 8.9*  HCT 25.0* 23.6* 27.5*   BMET  Recent Labs  05/14/14 0629 05/15/14 0350  NA 138 136*  K 4.5 3.9  CL 110 108  CO2 20 19  GLUCOSE 105* 77  BUN 17 16  CREATININE 1.93* 2.00*  CALCIUM 8.0* 8.0*   No results for input(s): LABPT, INR in the last 72 hours. No results for input(s): LABURIN in the last 72 hours. Results for orders placed or performed during the hospital encounter of 04/28/14  Body fluid culture     Status: None   Collection Time: 05/03/14 10:03 AM  Result Value Ref Range Status   Specimen Description FLUID LEFT KIDNEY  Final   Special Requests NONE  Final   Gram Stain   Final    ABUNDANT WBC PRESENT, PREDOMINANTLY PMN MODERATE GRAM POSITIVE COCCI IN PAIRS AND CHAINS Gram Stain Report Called to,Read Back By and Verified With: Gram Stain Report Called to,Read Back By and Verified With: Stefani Dama RN on 05/03/14 at 21:20 by Rise Mu Performed at [facility]    Culture   Final    ABUNDANT ENTEROCOCCUS SPECIES Performed at [facility]    Report Status  05/05/2014 FINAL  Final   Organism ID, Bacteria ENTEROCOCCUS SPECIES  Final      Susceptibility   Enterococcus species - MIC*    VANCOMYCIN 2 SENSITIVE Sensitive     AMPICILLIN <=2 SENSITIVE Sensitive     * ABUNDANT ENTEROCOCCUS SPECIES  Culture, blood (routine x 2)     Status: None   Collection Time: 05/03/14  7:26 PM  Result Value Ref Range Status   Specimen Description BLOOD LEFT ARM  Final   Special Requests BOTTLES DRAWN AEROBIC AND ANAEROBIC 5CC EACH  Final   Culture  Setup Time   Final    05/04/2014 03:55 Performed at [place]   Final    NO GROWTH 5 DAYS Performed at [facility]    Report Status 05/10/2014 FINAL  Final  Culture, blood (routine x 2)     Status: None   Collection Time: 05/03/14  7:50 PM  Result Value Ref Range Status   Specimen Description BLOOD RIGHT ANTECUBITAL  Final   Special Requests BOTTLES DRAWN AEROBIC AND ANAEROBIC 10CC EACH  Final   Culture  Setup Time   Final    05/04/2014 03:55 Performed at [place]   Final    NO GROWTH 5 DAYS Performed at [facility]    Report Status 05/10/2014 FINAL  Final  Urine culture     Status: None   Collection Time: 05/03/14  8:29 PM  Result Value Ref Range Status   Specimen Description URINE, RANDOM  Final   Special Requests ADDED 1940 05/04/14  Final   Culture  Setup Time   Final    05/04/2014 20:32 Performed at [place] Performed at [facility]   Final   Culture NO GROWTH Performed at [facility]   Final   Report Status 05/05/2014 FINAL  Final  MRSA PCR Screening     Status: None   Collection Time: 05/04/14 10:39 AM  Result Value Ref Range Status   MRSA by PCR NEGATIVE NEGATIVE Final    Comment:        The GeneXpert MRSA Assay (FDA approved for NASAL specimens only), is one component of a comprehensive MRSA colonization surveillance program. It is not intended to diagnose  MRSA infection nor to guide or monitor treatment for MRSA infections.   MRSA PCR Screening     Status: None   Collection Time: 05/13/14  5:25 AM  Result Value Ref Range Status   MRSA by PCR NEGATIVE NEGATIVE Final    Comment:        The GeneXpert MRSA Assay (FDA approved for NASAL specimens only), is one component of a comprehensive MRSA colonization surveillance program. It is not intended to diagnose MRSA infection nor to guide or monitor treatment for MRSA infections.     Studies/Results: Ct Abdomen Pelvis Wo Contrast  05/14/2014   CLINICAL DATA:  Hematuria. Left nephrolithotomy yesterday. Renal calculus.  EXAM: CT ABDOMEN AND PELVIS WITHOUT CONTRAST  TECHNIQUE: Multidetector CT imaging of the abdomen and pelvis was performed following the standard protocol without IV contrast.  COMPARISON:  Multiple exams, including 04/28/2014  FINDINGS: Lower chest: Small subpleural nodules in the right middle lobe measure up to 4 mm. Trace bilateral pleural effusions. Low-density blood pool suggests anemia. Lymph node with fatty hilum adjacent to the distal esophagus. Small epicardial lymph nodes are present. Coronary atherosclerosis.  Hepatobiliary: Small size the lateral segment left hepatic lobe with irregular high density within an along segment 3 of the liver, significance uncertain, but I favor cirrhosis. Possible recanalized umbilical vein.  Pancreas: Unremarkable  Spleen: Mild splenomegaly, splenic volume 600 cc. Mild prominence of the splenic vein.  Adrenals/Urinary Tract: Bilateral hypodense renal lesions favoring cysts. Large nephrostomy tube noted with high density fluid in the left collecting system and in internal stent or tube tracking through the nephrostomy tube and down the left ureter to the urinary bladder. The prior 1.5 cm left collecting system calculus is no longer present. No left ureteral calculus observed. There is a questionable 1-2 mm left mid kidney calculus on image 34 of  series 2. Stable calculi air observed in the right renal collecting system, largest 0.8 cm in long axis on image 33 of series 2. No right hydronephrosis or hydroureter. Both ureters deviated medially towards the cicatricial reaction and what may be a rectal stump. A Foley catheter is present in the urinary bladder which demonstrates nondistention and mild wall thickening which may primarily be due to the nondistention.  Stomach/Bowel: Complicated appearance in the pelvis. 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 Much of this could be from a prior  radiation therapy and appear similar to prior exams ; there are loops of bowel adjacent to the into the rectal stump and can activity is difficult to completely exclude, correlate with operative history. There is some narrowing or cicatricial reaction near the is suspected and of the rectal stump, drying the ureters medially.  There also some scattered air-fluid levels in nondilated loops of small bowel. Anastomotic staple line noted below the umbilicus along a loop of central abdominal bowel.  Vascular/Lymphatic: Aortoiliac atherosclerotic vascular disease.  Reproductive: Mild enlargement and marginal indistinctness of the prostate gland, although this is chronic and suspected the marginal indistinctness is due to the generalized perirectal stranding.  Other: Scattered mild ascites. Diffuse low-grade mesenteric edema. Considerable perirectal stranding/edema.  Musculoskeletal: Mild anterior wedging of T11 and T12 appears chronic. Degenerative facet arthropathy on the right at L4-5.  IMPRESSION: 1. High density fluid in the left collecting system could represent some residual contrast although blood in the collecting system is certainly a differential diagnostic  consideration. Prior large left collecting system and left proximal ureteral calculi are no longer present; there is left nephrostomy tube and a stent extending down to the urinary bladder. No significant renal capsular hematoma; there is right nonobstructive nephrolithiasis and bilateral renal cysts. 2. Hepatic cirrhosis with splenomegaly. 3. Extensive perirectal stranding, similar to prior, with some cicatricial reaction around what is thought to be the end of a rectal stump causing medial deviation of the ureters. 4. 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 There is scattered air- fluid levels in relatively nondilated loops of small bowel, along with a small amount of ascites and small bilateral pleural effusions. 5. Low-density blood pool compatible with anemia 6. Additional findings include small subpleural nodules in the right middle lobe which seems stable ; Coronary atherosclerosis ; and diffuse low-grade mesenteric edema.   Electronically Signed   By: Sherryl Barters M.D.   On: 05/14/2014 09:40    Assessment/Plan:   Patient is doing well. Nephroureteral catheter will be placed hopefully later today. Because of bloody urine, I will leave this Foley catheter in.   LOS: 18 days   Franchot Gallo M 05/16/2014, 6:23 AM

## 2014-05-16 NOTE — Progress Notes (Signed)
UR COMPLETED  

## 2014-05-16 NOTE — Progress Notes (Signed)
[name] did not have his procedure yesterday. This was delayed because he was getting transfused. I transfused him yesterday so that he could have his procedure yesterday.  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 His platelet count today is only 37,000. His hemoglobin is 8.9.  I will start him on some Amicar. Hopefully this might be able to help his platelets worked better.  I think if he is to get transfused with platelets, he will need HLA matched platelets.  He still has blood in the Foley.  Otherwise he feels okay. He's had no problem with pain. He's had no cough. He's had no shortness of breath.  Hopefully he will have the procedure today.  I will start him on an Amicar infusion. I think once he is able to go home, then we might be going to stop the Amicar or put him on 500 mg by mouth every 8 hours.  Pete  Col 3:17

## 2014-05-17 LAB — COMPREHENSIVE METABOLIC PANEL
ALBUMIN: 2 g/dL — AB (ref 3.5–5.2)
ALK PHOS: 111 U/L (ref 39–117)
ALT: 25 U/L (ref 0–53)
ANION GAP: 8 (ref 5–15)
AST: 19 U/L (ref 0–37)
BUN: 15 mg/dL (ref 6–23)
CHLORIDE: 110 meq/L (ref 96–112)
CO2: 20 mEq/L (ref 19–32)
Calcium: 8 mg/dL — ABNORMAL LOW (ref 8.4–10.5)
Creatinine, Ser: 1.82 mg/dL — ABNORMAL HIGH (ref 0.50–1.35)
GFR calc Af Amer: 45 mL/min — ABNORMAL LOW (ref >90)
GFR calc non Af Amer: 38 mL/min — ABNORMAL LOW (ref >90)
Glucose, Bld: 80 mg/dL (ref 70–99)
POTASSIUM: 3.7 meq/L (ref 3.7–5.3)
Sodium: 138 mEq/L (ref 137–147)
TOTAL PROTEIN: 4.8 g/dL — AB (ref 6.0–8.3)
Total Bilirubin: 0.9 mg/dL (ref 0.3–1.2)

## 2014-05-17 LAB — CBC
HCT: 26.7 % — ABNORMAL LOW (ref 39.0–52.0)
Hemoglobin: 8.6 g/dL — ABNORMAL LOW (ref 13.0–17.0)
MCH: 30.5 pg (ref 26.0–34.0)
MCHC: 32.2 g/dL (ref 30.0–36.0)
MCV: 94.7 fL (ref 78.0–100.0)
PLATELETS: 36 10*3/uL — AB (ref 150–400)
RBC: 2.82 MIL/uL — ABNORMAL LOW (ref 4.22–5.81)
RDW: 19.5 % — AB (ref 11.5–15.5)
WBC: 2.7 10*3/uL — AB (ref 4.0–10.5)

## 2014-05-17 NOTE — Progress Notes (Signed)
Foley discontinued at about 1115. Pt not able to void independently by 5:20. Bladder scan performed. About 255ml noted. Will cont to force more fluid and recheck pt within 2 hours. Vwilliams,rn.

## 2014-05-17 NOTE — Progress Notes (Signed)
[facility] PROGRESS NOTE  [name] HQI:[id number] DOB: [birth year] DOA: 04/28/2014 PCP: [name], MD  Assessment/Plan: Acute on CKD Stage III -2/2 obstructive uropathy. -S/p left PCN. -Foley removed per GU for voiding trail today. -Hopeful for DC home in am.  Severe Anemia/Thrombocytopenia/Hematuria -Has received 20 units of PRBCs and 11 units of platelets since admission! -On Amicar infusion per heme/onc. -Plt remain stable around 36. -?will he go home on Amicar.  Enterococcal Sepsis 2/2 UTI -As per ID continue amoxicillin until lithotripsy performed.  Stage IV Rectal Cancer -F/u with onc as an OP.  ?Lice -Scalp examined. -Has dry scalp with dandruff, but no lice.  Code Status: Full Code Family Communication: Patient only  Disposition Plan: Home in 24-48 hours hopefully   Consultants:  GU   Antibiotics:  Amoxicillin   Subjective: Excited about possibility of going home.  Objective: Filed Vitals:   05/16/14 1441 05/16/14 2123 05/17/14 0526 05/17/14 1521  BP: 109/55 123/65 145/68 136/69  Pulse: 67 65 76 71  Temp: 97.5 F (36.4 C) 98.2 F (36.8 C) 98.1 F (36.7 C) 98 F (36.7 C)  TempSrc: Oral Oral Oral Oral  Resp: 16 18 18 12   Height:      Weight:   102.5 kg (225 lb 15.5 oz)   SpO2: 99% 99% 100% 100%    Intake/Output Summary (Last 24 hours) at 05/17/14 1801 Last data filed at 05/17/14 1742  Gross per 24 hour  Intake 3397.5 ml  Output   3450 ml  Net  -52.5 ml   Filed Weights   05/15/14 0641 05/16/14 0506 05/17/14 0526  Weight: 102.2 kg (225 lb 5 oz) 102.6 kg (226 lb 3.1 oz) 102.5 kg (225 lb 15.5 oz)    Exam:   General:  AA Ox3  Cardiovascular: RRR  Respiratory: CTA B  Abdomen: S/NT/ND/+BS  Extremities: no C/C/E   Neurologic:  Non-focal  Data Reviewed: Basic Metabolic Panel:  Recent Labs Lab 05/12/14 0310 05/14/14 0629 05/15/14 0350 05/16/14 0510 05/17/14 0555  NA 137 138 136* 142 138  K  3.5* 4.5 3.9 4.1 3.7  CL 110 110 108 113* 110  CO2 19 20 19 21 20   GLUCOSE 79 105* 77 73 80  BUN 15 17 16 18 15   CREATININE 1.86* 1.93* 2.00* 1.90* 1.82*  CALCIUM 8.1* 8.0* 8.0* 8.0* 8.0*   Liver Function Tests:  Recent Labs Lab 05/12/14 0310 05/14/14 0629 05/15/14 0350 05/16/14 0510 05/17/14 0555  AST 42* 26 22 25 19   ALT 53 38 34 31 25  ALKPHOS 111 110 101 115 111  BILITOT 0.8 0.7 0.7 1.6* 0.9  PROT 5.0* 4.9* 4.7* 4.9* 4.8*  ALBUMIN 2.1* 2.1* 2.0* 2.1* 2.0*   No results for input(s): LIPASE, AMYLASE in the last 168 hours. No results for input(s): AMMONIA in the last 168 hours. CBC:  Recent Labs Lab 05/13/14 1120 05/14/14 0629 05/15/14 0350 05/16/14 0510 05/17/14 0555  WBC 2.8* 6.2 3.4* 3.0* 2.7*  HGB 9.0* 7.9* 7.4* 8.9* 8.6*  HCT 28.2* 25.0* 23.6* 27.5* 26.7*  MCV 94.0 94.3 95.9 92.9 94.7  PLT 39* 31* 27* 37* 36*   Cardiac Enzymes: No results for input(s): CKTOTAL, CKMB, CKMBINDEX, TROPONINI in the last 168 hours. BNP (last 3 results) No results for input(s): PROBNP in the last 8760 hours. CBG:  Recent Labs Lab 05/13/14 0733 05/13/14 0922 05/13/14 1108  GLUCAP 66* 90 91    Recent Results (from the past 240 hour(s))  MRSA PCR Screening  Status: None   Collection Time: 05/13/14  5:25 AM  Result Value Ref Range Status   MRSA by PCR NEGATIVE NEGATIVE Final    Comment:        The GeneXpert MRSA Assay (FDA approved for NASAL specimens only), is one component of a comprehensive MRSA colonization surveillance program. It is not intended to diagnose MRSA infection nor to guide or monitor treatment for MRSA infections.   Stone analysis     Status: None   Collection Time: 05/13/14 12:30 PM  Result Value Ref Range Status   Nidus Not observed  Final   Component 1 KSTONE See Below  Final    Comment: (NOTE) Uric Acid Dihydrate  80% Calcium Oxalate Monohydrate ([place]) 20%    Stone Weight KSTONE 0.1700 g Final    Comment: Performed at [facility]     Studies: Ir Nephrostogram Left  05/16/2014   CLINICAL DATA:  63 year old male with a history of left-sided nephrolithiasis. Left-sided percutaneous nephrostomy was placed 05/03/2014 for OR nephrolithotomy.  The patient has had hematuria after surgery. He has been referred for antegrade nephrostogram and potential PCNU placement  EXAM: LEFT ANTEGRADE NEPHROSTOGRAM THROUGH INDWELLING TUBE.  LEFT-SIDED EXCHANGE OF NEPHRO URETERAL TUBE FOR A NEW PERCUTANEOUS NEPHRO URETERAL DRAIN  FLUOROSCOPY TIME:  6 min, 30 seconds seconds  TECHNIQUE: The procedure risks, benefits, and alternatives were explained to the patient. Questions regarding the procedure were encouraged and answered. The patient understands and consents to the procedure.  The patient was positioned in prone position on the fluoroscopy table and the left flank and indwelling tubes were prepped and draped in the usual sterile fashion.  Antegrade nephrostogram was performed through the red rubber catheter after scout images were acquired.  A Kumpe the catheter, Bentson wire, standard Glidewire combination was used to navigate through the red rubber catheter, thrombus in the collecting system, and into the ureter. Antegrade nephrostogram images were taken of the length of the ureter into the pelvis. An occlusion was encountered in the distal third of the ureter.  A 0.035 Bentson wire was then navigated through the multipurpose catheter which was in the urinary bladder. This catheter was used to measure the length of the ureter for percutaneous nephro ureteral tube placement. 26 cm was estimated.  The Bentson wire was then readvanced into the urinary bladder and the multipurpose catheter was removed. A new 10 French nephro ureteral tube was advanced to the urinary bladder and the wire removed. The distal loop was formed in the urinary bladder and the proximal loop was formed in the collecting system.  Findings of the case and the plan were  discussed with Dr. Jeffie Pollock of Urology at the time of the procedure. We decided to leave the red rubber catheter in place given the thrombus in the collecting system.  Final images were stored.  No complications were encountered no significant blood loss was encountered.  Patient tolerated the procedure well and remained hemodynamically stable throughout.  COMPLICATIONS: None  FINDINGS: Scout images demonstrate 50 French red rubber catheter terminating in the collecting system of the left kidney. A multipurpose catheter traverses the left flank and terminates in the pelvis overlying the urinary bladder.  Antegrade nephrostogram through the red rubber catheter demonstrates the catheter terminates in the pelvis of the left kidney. Contrast partially fills the lower pole collecting system. Multiple ill-defined filling defects present within the pelvis, without contrast traversing the ureteropelvic junction.  Further contrast injection through a Kumpe the catheter that was  navigated into the left ureter demonstrates obstruction in the distal third of the left ureter. Multiple filling defects are present within the ureter, likely representing blood clots/debris.  Further images demonstrate exchange of the multipurpose catheter from the urinary bladder for a new percutaneous nephro ureteral tube.  Final images demonstrate percutaneous nephro ureteral tube with the proximal loop formed in the region of the renal pelvis and the distal loop formed in the region of the urinary bladder. The red rubber catheter remains in position within the renal pelvis to a drainage bag.  IMPRESSION: Status post antegrade nephrostogram through indwelling red rubber catheter demonstrates multiple filling defects in the pelvis of the left kidney compatible with blood clots/debris. Obstruction of the left ureter at the distal third, which is known.  Status post exchange of multipurpose catheter from the left ureter for a new, 10 French percutaneous  nephro ureteral stent. Distal loop formed within the urinary bladder and proximal loop formed in the region of the collecting system.  PLAN: Findings were discussed with Dr Jeffie Pollock of urology at the time of the procedure. The red rubber catheter was left in position to a drainage bag to help evacuate blood clots that are present.  While the patient's urinary Foley is in position, the nephro ureteral drain/stent can be capped. The tube should be and capped if the urethral Foley is removed.  Signed,  Dulcy Fanny. Earleen Newport, DO  [facility]  [facility]   Electronically Signed   By: Corrie Mckusick D.O.   On: 05/16/2014 17:40   Ir Ext Nephroureteral Cath Exchange  05/16/2014   CLINICAL DATA:  63 year old male with a history of left-sided nephrolithiasis. Left-sided percutaneous nephrostomy was placed 05/03/2014 for OR nephrolithotomy.  The patient has had hematuria after surgery. He has been referred for antegrade nephrostogram and potential PCNU placement  EXAM: LEFT ANTEGRADE NEPHROSTOGRAM THROUGH INDWELLING TUBE.  LEFT-SIDED EXCHANGE OF NEPHRO URETERAL TUBE FOR A NEW PERCUTANEOUS NEPHRO URETERAL DRAIN  FLUOROSCOPY TIME:  6 min, 30 seconds seconds  TECHNIQUE: The procedure risks, benefits, and alternatives were explained to the patient. Questions regarding the procedure were encouraged and answered. The patient understands and consents to the procedure.  The patient was positioned in prone position on the fluoroscopy table and the left flank and indwelling tubes were prepped and draped in the usual sterile fashion.  Antegrade nephrostogram was performed through the red rubber catheter after scout images were acquired.  A Kumpe the catheter, Bentson wire, standard Glidewire combination was used to navigate through the red rubber catheter, thrombus in the collecting system, and into the ureter. Antegrade nephrostogram images were taken of the length of the ureter into the pelvis.  An occlusion was encountered in the distal third of the ureter.  A 0.035 Bentson wire was then navigated through the multipurpose catheter which was in the urinary bladder. This catheter was used to measure the length of the ureter for percutaneous nephro ureteral tube placement. 26 cm was estimated.  The Bentson wire was then readvanced into the urinary bladder and the multipurpose catheter was removed. A new 10 French nephro ureteral tube was advanced to the urinary bladder and the wire removed. The distal loop was formed in the urinary bladder and the proximal loop was formed in the collecting system.  Findings of the case and the plan were discussed with Dr. Jeffie Pollock of Urology at the time of the procedure. We decided to leave the red rubber catheter in place given the thrombus  in the collecting system.  Final images were stored.  No complications were encountered no significant blood loss was encountered.  Patient tolerated the procedure well and remained hemodynamically stable throughout.  COMPLICATIONS: None  FINDINGS: Scout images demonstrate 2 French red rubber catheter terminating in the collecting system of the left kidney. A multipurpose catheter traverses the left flank and terminates in the pelvis overlying the urinary bladder.  Antegrade nephrostogram through the red rubber catheter demonstrates the catheter terminates in the pelvis of the left kidney. Contrast partially fills the lower pole collecting system. Multiple ill-defined filling defects present within the pelvis, without contrast traversing the ureteropelvic junction.  Further contrast injection through a Kumpe the catheter that was navigated into the left ureter demonstrates obstruction in the distal third of the left ureter. Multiple filling defects are present within the ureter, likely representing blood clots/debris.  Further images demonstrate exchange of the multipurpose catheter from the urinary bladder for a new percutaneous nephro  ureteral tube.  Final images demonstrate percutaneous nephro ureteral tube with the proximal loop formed in the region of the renal pelvis and the distal loop formed in the region of the urinary bladder. The red rubber catheter remains in position within the renal pelvis to a drainage bag.  IMPRESSION: Status post antegrade nephrostogram through indwelling red rubber catheter demonstrates multiple filling defects in the pelvis of the left kidney compatible with blood clots/debris. Obstruction of the left ureter at the distal third, which is known.  Status post exchange of multipurpose catheter from the left ureter for a new, 10 French percutaneous nephro ureteral stent. Distal loop formed within the urinary bladder and proximal loop formed in the region of the collecting system.  PLAN: Findings were discussed with Dr Jeffie Pollock of urology at the time of the procedure. The red rubber catheter was left in position to a drainage bag to help evacuate blood clots that are present.  While the patient's urinary Foley is in position, the nephro ureteral drain/stent can be capped. The tube should be and capped if the urethral Foley is removed.  Signed,  Dulcy Fanny. Earleen Newport, DO  [facility]  [facility]   Electronically Signed   By: Corrie Mckusick D.O.   On: 05/16/2014 17:40    Scheduled Meds: . sodium chloride   Intravenous Once  . amoxicillin  250 mg Oral Q12H  . cyanocobalamin  1,000 mcg Intramuscular Q30 days  . folic acid  1 mg Oral Daily  . hydrocortisone cream  1 application Topical BID  . pantoprazole  40 mg Oral Daily  . sodium chloride  3 mL Intravenous Q12H   Continuous Infusions: . sodium chloride 75 mL/hr at 05/16/14 4166    Active Problems:   Hematuria   AKI (acute kidney injury)   Obstructive uropathy   Anemia   Hyperkalemia   Nephrolithiasis   Metabolic acidosis    Time spent: 35 minutes. Greater than 50% of this time was spent in direct contact  with the patient coordinating care.    Lelon Frohlich  [facility] Pager [phone]  If 7PM-7AM, please contact night-coverage at [email or web address], password [facility] 05/17/2014, 6:01 PM  LOS: 19 days

## 2014-05-17 NOTE — Progress Notes (Signed)
Hb stable 8.5 Cr decreased a bit Bladder urine clear Perc tube still moderate bloody Trial of void Home tomorrow with or without perc tube AND WHEN OK WITH MEDICINE TEAM

## 2014-05-18 LAB — COMPREHENSIVE METABOLIC PANEL
ALK PHOS: 117 U/L (ref 39–117)
ALT: 23 U/L (ref 0–53)
AST: 22 U/L (ref 0–37)
Albumin: 2 g/dL — ABNORMAL LOW (ref 3.5–5.2)
Anion gap: 9 (ref 5–15)
BUN: 12 mg/dL (ref 6–23)
CO2: 19 mEq/L (ref 19–32)
CREATININE: 1.77 mg/dL — AB (ref 0.50–1.35)
Calcium: 8 mg/dL — ABNORMAL LOW (ref 8.4–10.5)
Chloride: 114 mEq/L — ABNORMAL HIGH (ref 96–112)
GFR, EST AFRICAN AMERICAN: 46 mL/min — AB (ref >90)
GFR, EST NON AFRICAN AMERICAN: 40 mL/min — AB (ref >90)
GLUCOSE: 71 mg/dL (ref 70–99)
POTASSIUM: 3.8 meq/L (ref 3.7–5.3)
Sodium: 142 mEq/L (ref 137–147)
Total Bilirubin: 1 mg/dL (ref 0.3–1.2)
Total Protein: 4.6 g/dL — ABNORMAL LOW (ref 6.0–8.3)

## 2014-05-18 LAB — CBC
HCT: 25.6 % — ABNORMAL LOW (ref 39.0–52.0)
HEMOGLOBIN: 8.3 g/dL — AB (ref 13.0–17.0)
MCH: 30.5 pg (ref 26.0–34.0)
MCHC: 32.4 g/dL (ref 30.0–36.0)
MCV: 94.1 fL (ref 78.0–100.0)
Platelets: 39 10*3/uL — ABNORMAL LOW (ref 150–400)
RBC: 2.72 MIL/uL — ABNORMAL LOW (ref 4.22–5.81)
RDW: 19.2 % — ABNORMAL HIGH (ref 11.5–15.5)
WBC: 2.4 10*3/uL — AB (ref 4.0–10.5)

## 2014-05-18 MED ORDER — HEPARIN SOD (PORK) LOCK FLUSH 100 UNIT/ML IV SOLN
500.0000 [IU] | INTRAVENOUS | Status: AC | PRN
  Administered 2014-05-18: 500 [IU]

## 2014-05-18 MED ORDER — AMOXICILLIN 250 MG PO CAPS: 250.0000 mg | ORAL_CAPSULE | Freq: Two times a day (BID) | ORAL | Status: DC

## 2014-05-18 NOTE — Progress Notes (Signed)
Failed trial of voiding but low volume No sensation to void Urine from perc tube clear/dark Scrotal edema moderate and penile/leg edema Plan: leave foley in and remove large perc tube

## 2014-05-18 NOTE — Progress Notes (Signed)
05/18/2014 1530 [facility] aware of dc home with HH. Jonnie Finner RN [facility]CM Case Mgmt phone [phone]

## 2014-05-18 NOTE — Discharge Summary (Signed)
Physician Discharge Summary  [name] JGO:[id number] DOB: [birth year] DOA: 04/28/2014  PCP: [name], MD  Admit date: 04/28/2014 Discharge date: 05/18/2014  Time spent: 45 minutes  Recommendations for Outpatient Follow-up:  -Will be discharged home today. -To follow up with Dr. Jeffie Pollock as scheduled by him. -HHRN arranged for new foley care due to acute urinary retention.  Discharge Diagnoses:  Active Problems:   Hematuria   AKI (acute kidney injury)   Obstructive uropathy   Anemia   Hyperkalemia   Nephrolithiasis   Metabolic acidosis   Discharge Condition: Stable and improved  Filed Weights   05/16/14 0506 05/17/14 0526 05/18/14 0505  Weight: 102.6 kg (226 lb 3.1 oz) 102.5 kg (225 lb 15.5 oz) 103.9 kg (229 lb 0.9 oz)    History of present illness:  This is a 63 y.o. year old male with significant past medical history of metastatic colon ca-currently on chemotherapy, anemia, CAD, hx/o hydronephrosis, nephrolithiasis presenting with hematuria, AKI, obstructive uropathy, anemia. Patient self reports a recurrent history of nephrolithiasis and hematuria over the past 2 or 3 years. Most recent admission was April of this year for similar symptoms. States that a ureteral stent was placed for left hydronephrosis. Patient states that he's had about 2-3 weeks of intermittent hematuria. Patient reports an episode where he believes he passed his ureteral stent while using the bathroom. Presented to his hematologist/oncologist today for follow-up for chemotherapy. Reported episodes of hematuria. He is at progressively worsening renal function over the past 2-3 weeks with a creatinine that increased from 1.7-18.4 today. Bicarbonate of 9. Anion gap of 24. Hematology/oncology spoke with urology via Dr. Laural Golden. Initial plan was for follow-up with urology tomorrow. However, patient was directed to the ER for further evaluation given renal insufficiency. On presentation  temperature 97.4, heart rate in the 70s to 100s, respirations in the tens to 20s, blood pressure in the 110s to 140s, satting greater than 99% on room air. White blood cell count 10.5, hemoglobin 4.6, platelets 52, sodium 134, potassium 5.4, creatinine 18.4, bicarbonate 9. Mag 2.5. CT renal study showsIncreasing moderate to severe right hydroureteronephrosis and severe left hydroureteronephrosis extending to the pelvis in the region of the patient's rectal suture lines/anastomosis. 0.9 x 1.2 cm calculus at the left UPJ which is migrated from the left renal pelvis since the CT dated 02/24/2014. Multiple bilateral renal calculi are otherwise unchanged. Dr. Jeffie Pollock made aware of case per EDP. Would like pt to go to [facility] for further evaluation. I have asked EDP ([place]) to also make nephrology aware of case. Foley attempted multiple times in ER unsuccessfully. Pt now refusing foley placement until he is seen by urology.    Hospital Course:   Acute on CKD Stage III -2/2 obstructive uropathy. -S/p left PCN. Removed 12/20 by GU. -Foley replaced as he failed voiding trial. -Follow up with urology as scheduled.  Severe Anemia/Thrombocytopenia/Hematuria -Has received 20 units of PRBCs and 11 units of platelets since admission! -Plt remain stable around 36. -Will follow up with hematology/onc as scheduled as well.  Enterococcal Sepsis 2/2 UTI -As per ID continue amoxicillin until lithotripsy performed.  Stage IV Rectal Cancer -F/u with onc as an OP.  ?Lice -Scalp examined. -Has dry scalp with dandruff, but no lice.  Consultations:  GU  Onc  ID  Discharge Instructions  Discharge Instructions    Increase activity slowly    Complete by:  As directed  Medication List    STOP taking these medications        amoxicillin-clavulanate 875-125 MG per tablet  Commonly known as:  AUGMENTIN      TAKE these medications        amoxicillin 250 MG capsule  Commonly known as:   AMOXIL  Take 1 capsule (250 mg total) by mouth every 12 (twelve) hours.     clindamycin 1 % lotion  Commonly known as:  CLEOCIN-T  Apply topically 2 (two) times daily.     cyanocobalamin 1000 MCG/ML injection  Commonly known as:  (VITAMIN B-12)  Inject 1,000 mcg into the muscle every 30 (thirty) days.     folic acid 1 MG tablet  Commonly known as:  FOLVITE  Take 1 tablet (1 mg total) by mouth daily.     HYDROcodone-acetaminophen 5-325 MG per tablet  Commonly known as:  NORCO/VICODIN  Take 1-2 tablets by mouth every 6 (six) hours as needed.     hydrocortisone cream 1 %  Apply 1 application topically 2 (two) times daily. Mix with urea cream and apply to affected areas.     hydrocortisone-urea 1-10 % cream  Commonly known as:  CARMOL-HC  Apply topically 3 (three) times daily.     lidocaine-prilocaine cream  Commonly known as:  EMLA  Apply a quarter size amount to port site 1 hour prior to chemo. Do not rub in. Cover with plastic wrap.     omeprazole 20 MG capsule  Commonly known as:  PRILOSEC  Take 1 capsule (20 mg total) by mouth daily.     ondansetron 8 MG tablet  Commonly known as:  ZOFRAN  Take 1 tablet (8 mg total) by mouth 2 (two) times daily as needed for nausea or vomiting.     urea 10 % cream  Commonly known as:  CARMOL  Apply topically as needed. Mix with hydrocortisone cream and apply to affected areas.       Allergies  Allergen Reactions  . Daypro [Oxaprozin] Nausea And Vomiting  . Xeloda [Capecitabine] Other (See Comments)    Homocidal and suicidal ideations       Follow-up Information    Follow up with Malka So, MD.   Specialty:  Urology   Why:  as scheduled   Contact information:   [place] [address] [phone]        The results of significant diagnostics from this hospitalization (including imaging, microbiology, ancillary and laboratory) are listed below for reference.    Significant Diagnostic Studies: Ct  Abdomen Pelvis Wo Contrast  05/14/2014   CLINICAL DATA:  Hematuria. Left nephrolithotomy yesterday. Renal calculus.  EXAM: CT ABDOMEN AND PELVIS WITHOUT CONTRAST  TECHNIQUE: Multidetector CT imaging of the abdomen and pelvis was performed following the standard protocol without IV contrast.  COMPARISON:  Multiple exams, including 04/28/2014  FINDINGS: Lower chest: Small subpleural nodules in the right middle lobe measure up to 4 mm. Trace bilateral pleural effusions. Low-density blood pool suggests anemia. Lymph node with fatty hilum adjacent to the distal esophagus. Small epicardial lymph nodes are present. Coronary atherosclerosis.  Hepatobiliary: Small size the lateral segment left hepatic lobe with irregular high density within an along segment 3 of the liver, significance uncertain, but I favor cirrhosis. Possible recanalized umbilical vein.  Pancreas: Unremarkable  Spleen: Mild splenomegaly, splenic volume 600 cc. Mild prominence of the splenic vein.  Adrenals/Urinary Tract: Bilateral hypodense renal lesions favoring cysts. Large nephrostomy tube noted with high density fluid in  the left collecting system and in internal stent or tube tracking through the nephrostomy tube and down the left ureter to the urinary bladder. The prior 1.5 cm left collecting system calculus is no longer present. No left ureteral calculus observed. There is a questionable 1-2 mm left mid kidney calculus on image 34 of series 2. Stable calculi air observed in the right renal collecting system, largest 0.8 cm in long axis on image 33 of series 2. No right hydronephrosis or hydroureter. Both ureters deviated medially towards the cicatricial reaction and what may be a rectal stump. A Foley catheter is present in the urinary bladder which demonstrates nondistention and mild wall thickening which may primarily be due to the nondistention.  Stomach/Bowel: Complicated appearance in the pelvis. There is an ileostomy with a peristomal hernia  containing loops of small bowel, along with what is probably a rectal stump or pouch demonstrating wall thickening, extensive perirectal stranding, presacral edema, and small surrounding lymph nodes. Much of this could be from a prior radiation therapy and appear similar to prior exams ; there are loops of bowel adjacent to the into the rectal stump and can activity is difficult to completely exclude, correlate with operative history. There is some narrowing or cicatricial reaction near the is suspected and of the rectal stump, drying the ureters medially.  There also some scattered air-fluid levels in nondilated loops of small bowel. Anastomotic staple line noted below the umbilicus along a loop of central abdominal bowel.  Vascular/Lymphatic: Aortoiliac atherosclerotic vascular disease.  Reproductive: Mild enlargement and marginal indistinctness of the prostate gland, although this is chronic and suspected the marginal indistinctness is due to the generalized perirectal stranding.  Other: Scattered mild ascites. Diffuse low-grade mesenteric edema. Considerable perirectal stranding/edema.  Musculoskeletal: Mild anterior wedging of T11 and T12 appears chronic. Degenerative facet arthropathy on the right at L4-5.  IMPRESSION: 1. High density fluid in the left collecting system could represent some residual contrast although blood in the collecting system is certainly a differential diagnostic consideration. Prior large left collecting system and left proximal ureteral calculi are no longer present; there is left nephrostomy tube and a stent extending down to the urinary bladder. No significant renal capsular hematoma; there is right nonobstructive nephrolithiasis and bilateral renal cysts. 2. Hepatic cirrhosis with splenomegaly. 3. Extensive perirectal stranding, similar to prior, with some cicatricial reaction around what is thought to be the end of a rectal stump causing medial deviation of the ureters. 4.  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 There is scattered air- fluid levels in relatively nondilated loops of small bowel, along with a small amount of ascites and small bilateral pleural effusions. 5. Low-density blood pool compatible with anemia 6. Additional findings include small subpleural nodules in the right middle lobe which seems stable ; Coronary atherosclerosis ; and diffuse low-grade mesenteric edema.   Electronically Signed   By: Sherryl Barters M.D.   On: 05/14/2014 09:40   US Renal  04/21/2014   CLINICAL DATA:  Elevated serum creatinine.  EXAM: RENAL/URINARY TRACT ULTRASOUND COMPLETE  COMPARISON:  CT scan of February 24, 2014.  FINDINGS: Right Kidney:  Length: 13 cm. Moderate hydronephrosis is noted. 1.5 mm calculus is noted in lower pole collecting system 1.4 cm cyst is noted in midpole.  Left Kidney:  Length: 14.3 cm. Moderate left hydronephrosis is noted. Multiple stones are noted. 3.3 cm cyst is seen in lower pole.  Bladder:  Echogenic material is noted posteriorly within lumen of urinary  bladder which may represent blood clot, but mass cannot be excluded. Ureteral jets are not visualized.  IMPRESSION: Moderate bilateral hydronephrosis and nephrocalcinosis is noted. Bilateral renal cysts are noted. Echogenic material is noted posteriorly within lumen of urinary bladder which may represent blood clot, but neoplasm cannot be excluded and further evaluation with cystoscopy is recommended.   Electronically Signed   By: Sabino Dick M.D.   On: 04/21/2014 12:57   US Venous Img Lower Unilateral Left  04/21/2014   CLINICAL DATA:  Left lower extremity pain and edema.  EXAM: Left LOWER EXTREMITY VENOUS DOPPLER ULTRASOUND  TECHNIQUE: Gray-scale sonography with graded compression, as well as color Doppler and duplex ultrasound were performed to evaluate the lower extremity deep venous systems from the level of the common femoral vein and including the common femoral,  femoral, profunda femoral, popliteal and calf veins including the posterior tibial, peroneal and gastrocnemius veins when visible. The superficial great saphenous vein was also interrogated. Spectral Doppler was utilized to evaluate flow at rest and with distal augmentation maneuvers in the common femoral, femoral and popliteal veins.  COMPARISON:  None.  FINDINGS: Contralateral Common Femoral Vein: Respiratory phasicity is normal and symmetric with the symptomatic side. No evidence of thrombus. Normal compressibility.  Common Femoral Vein: No evidence of thrombus. Normal compressibility, respiratory phasicity and response to augmentation.  Saphenofemoral Junction: No evidence of thrombus. Normal compressibility and flow on color Doppler imaging.  Profunda Femoral Vein: No evidence of thrombus. Normal compressibility and flow on color Doppler imaging.  Femoral Vein: No evidence of thrombus. Normal compressibility, respiratory phasicity and response to augmentation.  Popliteal Vein: No evidence of thrombus. Normal compressibility, respiratory phasicity and response to augmentation.  Calf Veins: No evidence of thrombus. Normal compressibility and flow on color Doppler imaging.  Superficial Great Saphenous Vein: No evidence of thrombus. Normal compressibility and flow on color Doppler imaging.  Venous Reflux:  None.  Other Findings:  None.  IMPRESSION: No evidence of deep venous thrombosis seen in left lower extremity.   Electronically Signed   By: Sabino Dick M.D.   On: 04/21/2014 12:51   Ir Nephrostogram Left  05/16/2014   CLINICAL DATA:  63 year old male with a history of left-sided nephrolithiasis. Left-sided percutaneous nephrostomy was placed 05/03/2014 for OR nephrolithotomy.  The patient has had hematuria after surgery. He has been referred for antegrade nephrostogram and potential PCNU placement  EXAM: LEFT ANTEGRADE NEPHROSTOGRAM THROUGH INDWELLING TUBE.  LEFT-SIDED EXCHANGE OF NEPHRO URETERAL TUBE FOR A  NEW PERCUTANEOUS NEPHRO URETERAL DRAIN  FLUOROSCOPY TIME:  6 min, 30 seconds seconds  TECHNIQUE: The procedure risks, benefits, and alternatives were explained to the patient. Questions regarding the procedure were encouraged and answered. The patient understands and consents to the procedure.  The patient was positioned in prone position on the fluoroscopy table and the left flank and indwelling tubes were prepped and draped in the usual sterile fashion.  Antegrade nephrostogram was performed through the red rubber catheter after scout images were acquired.  A Kumpe the catheter, Bentson wire, standard Glidewire combination was used to navigate through the red rubber catheter, thrombus in the collecting system, and into the ureter. Antegrade nephrostogram images were taken of the length of the ureter into the pelvis. An occlusion was encountered in the distal third of the ureter.  A 0.035 Bentson wire was then navigated through the multipurpose catheter which was in the urinary bladder. This catheter was used to measure the length of the ureter for percutaneous nephro ureteral  tube placement. 26 cm was estimated.  The Bentson wire was then readvanced into the urinary bladder and the multipurpose catheter was removed. A new 10 French nephro ureteral tube was advanced to the urinary bladder and the wire removed. The distal loop was formed in the urinary bladder and the proximal loop was formed in the collecting system.  Findings of the case and the plan were discussed with Dr. Jeffie Pollock of Urology at the time of the procedure. We decided to leave the red rubber catheter in place given the thrombus in the collecting system.  Final images were stored.  No complications were encountered no significant blood loss was encountered.  Patient tolerated the procedure well and remained hemodynamically stable throughout.  COMPLICATIONS: None  FINDINGS: Scout images demonstrate 45 French red rubber catheter terminating in the  collecting system of the left kidney. A multipurpose catheter traverses the left flank and terminates in the pelvis overlying the urinary bladder.  Antegrade nephrostogram through the red rubber catheter demonstrates the catheter terminates in the pelvis of the left kidney. Contrast partially fills the lower pole collecting system. Multiple ill-defined filling defects present within the pelvis, without contrast traversing the ureteropelvic junction.  Further contrast injection through a Kumpe the catheter that was navigated into the left ureter demonstrates obstruction in the distal third of the left ureter. Multiple filling defects are present within the ureter, likely representing blood clots/debris.  Further images demonstrate exchange of the multipurpose catheter from the urinary bladder for a new percutaneous nephro ureteral tube.  Final images demonstrate percutaneous nephro ureteral tube with the proximal loop formed in the region of the renal pelvis and the distal loop formed in the region of the urinary bladder. The red rubber catheter remains in position within the renal pelvis to a drainage bag.  IMPRESSION: Status post antegrade nephrostogram through indwelling red rubber catheter demonstrates multiple filling defects in the pelvis of the left kidney compatible with blood clots/debris. Obstruction of the left ureter at the distal third, which is known.  Status post exchange of multipurpose catheter from the left ureter for a new, 10 French percutaneous nephro ureteral stent. Distal loop formed within the urinary bladder and proximal loop formed in the region of the collecting system.  PLAN: Findings were discussed with Dr Jeffie Pollock of urology at the time of the procedure. The red rubber catheter was left in position to a drainage bag to help evacuate blood clots that are present.  While the patient's urinary Foley is in position, the nephro ureteral drain/stent can be capped. The tube should be and capped if  the urethral Foley is removed.  Signed,  Dulcy Fanny. Earleen Newport, DO  [facility]  [facility]   Electronically Signed   By: Corrie Mckusick D.O.   On: 05/16/2014 17:40   Ir Perc Nephrostomy Left  05/03/2014   CLINICAL DATA:  Left hydronephrosis and ureteral obstruction  EXAM: PERC NEPHROSTOMY*L*; IR ULTRASOUND GUIDANCE  FLUOROSCOPY TIME:  2 min and 42 seconds.  MEDICATIONS AND MEDICAL HISTORY: Versed 1.5 mg, Fentanyl 75 mcg.  As antibiotic prophylaxis, Ancef was ordered pre-procedure and administered intravenously within one hour of incision.  ANESTHESIA/SEDATION: Moderate sedation time: 15 minutes  CONTRAST:  10 cc Omnipaque 300  PROCEDURE: The procedure, risks, benefits, and alternatives were explained to the patient. Questions regarding the procedure were encouraged and answered. The patient understands and consents to the procedure.  The back was prepped with Betadine in a sterile fashion, and a sterile  drape was applied covering the operative field. A sterile gown and sterile gloves were used for the procedure.  Under sonographic guidance, a 22 gauge Chiba needle was inserted into a posterior lower pole calyx. Contrast was injected. It was removed over a 018 wire which was up sized to a 3 J. A 10 French dilator followed by a 75 French nephrostomy was advanced over the wire and coiled in the renal pelvis. Contrast was injected. It was sewn to the skin.  FINDINGS: Images document access into the left kidney collecting system via posterior lower pole calices. The final image demonstrates a 10 French nephrostomy coiled in the left renal pelvis.  COMPLICATIONS: None  IMPRESSION: Successful left nephrostomy.   Electronically Signed   By: Maryclare Bean M.D.   On: 05/03/2014 12:19   Ir US Guide Bx Asp/drain  05/03/2014   CLINICAL DATA:  Left hydronephrosis and ureteral obstruction  EXAM: PERC NEPHROSTOMY*L*; IR ULTRASOUND GUIDANCE  FLUOROSCOPY TIME:  2 min and 42 seconds.   MEDICATIONS AND MEDICAL HISTORY: Versed 1.5 mg, Fentanyl 75 mcg.  As antibiotic prophylaxis, Ancef was ordered pre-procedure and administered intravenously within one hour of incision.  ANESTHESIA/SEDATION: Moderate sedation time: 15 minutes  CONTRAST:  10 cc Omnipaque 300  PROCEDURE: The procedure, risks, benefits, and alternatives were explained to the patient. Questions regarding the procedure were encouraged and answered. The patient understands and consents to the procedure.  The back was prepped with Betadine in a sterile fashion, and a sterile drape was applied covering the operative field. A sterile gown and sterile gloves were used for the procedure.  Under sonographic guidance, a 22 gauge Chiba needle was inserted into a posterior lower pole calyx. Contrast was injected. It was removed over a 018 wire which was up sized to a 3 J. A 10 French dilator followed by a 67 French nephrostomy was advanced over the wire and coiled in the renal pelvis. Contrast was injected. It was sewn to the skin.  FINDINGS: Images document access into the left kidney collecting system via posterior lower pole calices. The final image demonstrates a 10 French nephrostomy coiled in the left renal pelvis.  COMPLICATIONS: None  IMPRESSION: Successful left nephrostomy.   Electronically Signed   By: Maryclare Bean M.D.   On: 05/03/2014 12:19   Dg C-arm Gt 120 Min-no Report  05/13/2014   CLINICAL DATA: kidney stones   C-ARM GT 120 MINUTE  Fluoroscopy was utilized by the requesting physician.  No radiographic  interpretation.    Ct Renal Stone Study  04/28/2014   CLINICAL DATA:  63 year old male with left-sided abdominal pelvic pain and hematuria. Renal stent came out 2 weeks ago. History of metastatic colon cancer.  EXAM: CT ABDOMEN AND PELVIS WITHOUT CONTRAST  TECHNIQUE: Multidetector CT imaging of the abdomen and pelvis was performed following the standard protocol without IV contrast.  COMPARISON:  04/21/2014 renal ultrasound.   02/24/2014 and prior CTs.  FINDINGS: Increasing moderate to severe right hydroureteronephrosis and severe left hydroureteronephrosis identified extending into the pelvis were there is unchanged soft tissue and a retractile process along the rectal suture line likely representing neoplasm causing urinary obstruction. Perirectal and precoccygeal/presacral stranding is unchanged.  There is high-density material within the bladder or representing blood or debris.  Multiple urinary calculi are identified including the following:  An 8 mm right lower pole calculus.  Several punctate right renal calculi.  A 10 x 15 mm calculus within the left renal pelvis.  A 1.2 x 2  cm calculus within the mid-lower left kidney.  A 0.9 x 1.2 cm calculus at the left UPJ which has migrated from the left renal pelvis since the prior CT. This calculus may be causing some degree of obstruction but the left ureter appears dilated distal to this calculus.  The blood pool is very low density compatible with anemia.  The liver is unchanged.  Mild splenomegaly again noted.  The pancreas and adrenal glands are unremarkable.  There is no evidence of free fluid, enlarged lymph nodes, Please note that parenchymal abnormalities may be missed without intravenous contrast.  The patient is status post cholecystectomy.  A moderate wide mouthed anterior right parastomal hernia containing small bowel loops are noted.  There is no evidence of bowel obstruction, pneumoperitoneum or definite abscess.  No acute or suspicious bony abnormality is are noted.  IMPRESSION: Increasing moderate to severe right hydroureteronephrosis and severe left hydroureteronephrosis extending to the pelvis in the region of the patient's rectal suture lines/anastomosis.  0.9 x 1.2 cm calculus at the left UPJ which is migrated from the left renal pelvis since the CT dated 02/24/2014. This calculus may be causing some degree of obstruction, but the left ureter is dilated distal to this  calculus.  Multiple bilateral renal calculi are otherwise unchanged.  High-density material within the bladder -question blood versus debris.  Unchanged soft tissue and stranding region of the rectal anastomosis.  Unchanged mild splenomegaly and right pelvic peristomal hernia containing small bowel. No evidence of bowel obstruction.   Electronically Signed   By: Hassan Rowan M.D.   On: 04/28/2014 18:16   Ir Ext Nephroureteral Cath Exchange  05/16/2014   CLINICAL DATA:  63 year old male with a history of left-sided nephrolithiasis. Left-sided percutaneous nephrostomy was placed 05/03/2014 for OR nephrolithotomy.  The patient has had hematuria after surgery. He has been referred for antegrade nephrostogram and potential PCNU placement  EXAM: LEFT ANTEGRADE NEPHROSTOGRAM THROUGH INDWELLING TUBE.  LEFT-SIDED EXCHANGE OF NEPHRO URETERAL TUBE FOR A NEW PERCUTANEOUS NEPHRO URETERAL DRAIN  FLUOROSCOPY TIME:  6 min, 30 seconds seconds  TECHNIQUE: The procedure risks, benefits, and alternatives were explained to the patient. Questions regarding the procedure were encouraged and answered. The patient understands and consents to the procedure.  The patient was positioned in prone position on the fluoroscopy table and the left flank and indwelling tubes were prepped and draped in the usual sterile fashion.  Antegrade nephrostogram was performed through the red rubber catheter after scout images were acquired.  A Kumpe the catheter, Bentson wire, standard Glidewire combination was used to navigate through the red rubber catheter, thrombus in the collecting system, and into the ureter. Antegrade nephrostogram images were taken of the length of the ureter into the pelvis. An occlusion was encountered in the distal third of the ureter.  A 0.035 Bentson wire was then navigated through the multipurpose catheter which was in the urinary bladder. This catheter was used to measure the length of the ureter for percutaneous nephro ureteral  tube placement. 26 cm was estimated.  The Bentson wire was then readvanced into the urinary bladder and the multipurpose catheter was removed. A new 10 French nephro ureteral tube was advanced to the urinary bladder and the wire removed. The distal loop was formed in the urinary bladder and the proximal loop was formed in the collecting system.  Findings of the case and the plan were discussed with Dr. Jeffie Pollock of Urology at the time of the procedure. We decided to leave the red  rubber catheter in place given the thrombus in the collecting system.  Final images were stored.  No complications were encountered no significant blood loss was encountered.  Patient tolerated the procedure well and remained hemodynamically stable throughout.  COMPLICATIONS: None  FINDINGS: Scout images demonstrate 71 French red rubber catheter terminating in the collecting system of the left kidney. A multipurpose catheter traverses the left flank and terminates in the pelvis overlying the urinary bladder.  Antegrade nephrostogram through the red rubber catheter demonstrates the catheter terminates in the pelvis of the left kidney. Contrast partially fills the lower pole collecting system. Multiple ill-defined filling defects present within the pelvis, without contrast traversing the ureteropelvic junction.  Further contrast injection through a Kumpe the catheter that was navigated into the left ureter demonstrates obstruction in the distal third of the left ureter. Multiple filling defects are present within the ureter, likely representing blood clots/debris.  Further images demonstrate exchange of the multipurpose catheter from the urinary bladder for a new percutaneous nephro ureteral tube.  Final images demonstrate percutaneous nephro ureteral tube with the proximal loop formed in the region of the renal pelvis and the distal loop formed in the region of the urinary bladder. The red rubber catheter remains in position within the renal  pelvis to a drainage bag.  IMPRESSION: Status post antegrade nephrostogram through indwelling red rubber catheter demonstrates multiple filling defects in the pelvis of the left kidney compatible with blood clots/debris. Obstruction of the left ureter at the distal third, which is known.  Status post exchange of multipurpose catheter from the left ureter for a new, 10 French percutaneous nephro ureteral stent. Distal loop formed within the urinary bladder and proximal loop formed in the region of the collecting system.  PLAN: Findings were discussed with Dr Jeffie Pollock of urology at the time of the procedure. The red rubber catheter was left in position to a drainage bag to help evacuate blood clots that are present.  While the patient's urinary Foley is in position, the nephro ureteral drain/stent can be capped. The tube should be and capped if the urethral Foley is removed.  Signed,  Dulcy Fanny. Earleen Newport, DO  [facility]  [facility]   Electronically Signed   By: Corrie Mckusick D.O.   On: 05/16/2014 17:40    Microbiology: Recent Results (from the past 240 hour(s))  MRSA PCR Screening     Status: None   Collection Time: 05/13/14  5:25 AM  Result Value Ref Range Status   MRSA by PCR NEGATIVE NEGATIVE Final    Comment:        The GeneXpert MRSA Assay (FDA approved for NASAL specimens only), is one component of a comprehensive MRSA colonization surveillance program. It is not intended to diagnose MRSA infection nor to guide or monitor treatment for MRSA infections.   Stone analysis     Status: None   Collection Time: 05/13/14 12:30 PM  Result Value Ref Range Status   Nidus Not observed  Final   Component 1 KSTONE See Below  Final    Comment: (NOTE) Uric Acid Dihydrate  80% Calcium Oxalate Monohydrate ([place]) 20%    Stone Weight KSTONE 0.1700 g Final    Comment: Performed at [facility]: Basic Metabolic Panel:  Recent  Labs Lab 05/14/14 0629 05/15/14 0350 05/16/14 0510 05/17/14 0555 05/18/14 0540  NA 138 136* 142 138 142  K 4.5 3.9 4.1 3.7 3.8  CL 110 108 113*  110 114*  CO2 20 19 21 20 19   GLUCOSE 105* 77 73 80 71  BUN 17 16 18 15 12   CREATININE 1.93* 2.00* 1.90* 1.82* 1.77*  CALCIUM 8.0* 8.0* 8.0* 8.0* 8.0*   Liver Function Tests:  Recent Labs Lab 05/14/14 0629 05/15/14 0350 05/16/14 0510 05/17/14 0555 05/18/14 0540  AST 26 22 25 19 22   ALT 38 34 31 25 23   ALKPHOS 110 101 115 111 117  BILITOT 0.7 0.7 1.6* 0.9 1.0  PROT 4.9* 4.7* 4.9* 4.8* 4.6*  ALBUMIN 2.1* 2.0* 2.1* 2.0* 2.0*   No results for input(s): LIPASE, AMYLASE in the last 168 hours. No results for input(s): AMMONIA in the last 168 hours. CBC:  Recent Labs Lab 05/14/14 0629 05/15/14 0350 05/16/14 0510 05/17/14 0555 05/18/14 0540  WBC 6.2 3.4* 3.0* 2.7* 2.4*  HGB 7.9* 7.4* 8.9* 8.6* 8.3*  HCT 25.0* 23.6* 27.5* 26.7* 25.6*  MCV 94.3 95.9 92.9 94.7 94.1  PLT 31* 27* 37* 36* 39*   Cardiac Enzymes: No results for input(s): CKTOTAL, CKMB, CKMBINDEX, TROPONINI in the last 168 hours. BNP: BNP (last 3 results) No results for input(s): PROBNP in the last 8760 hours. CBG:  Recent Labs Lab 05/13/14 0733 05/13/14 0922 05/13/14 1108  GLUCAP 66* 90 91       Signed:  [facility] Pager: [phone] 05/18/2014, 7:21 PM

## 2014-05-18 NOTE — Progress Notes (Signed)
MD d/c nephrostomy tube on left side this am. Moderate amount of serosanguinous drainage coming from site. Dressing changed. Educated wife on how to change dressing.  Supplies will be sent home with patient.  Patient also to be discharged with foley catheter in place.  Patient states he had a foley at home last April/May so feels comfortable with it.  Educated patient and wife on how to change the catheter from a night bag to day bag along with emptying it and caring for it. Patient and wife did return demonstration.  Questions were answered and patient feels confident and comfortable going home with foley catheter.  Will continue to monitor until discharge.

## 2014-05-18 NOTE — Progress Notes (Signed)
Reported to me that foley was d/c'd at 11:30 AM and pt has not been able to void yet.  L PCN has just been emptied of 225 dark, clear urine not clots noted.  Bladder scan reveals 250 to 300 cc's in bladder.  Pt has 3+ pitting edema in ble and dependent edema moderate in scrotal area.  Colostomy producing liquid brown liquid.  Dr.  Matilde Sprang, is on call and notified. Orders to replace foley with #16 FR, coude cath.  Cath placed easily, pt tolerated well and immediate return of 300cc's clear, amber urine,no clots noted.  Will continue to monitor.

## 2014-05-19 ENCOUNTER — Other Ambulatory Visit (HOSPITAL_COMMUNITY): Payer: Self-pay | Admitting: Oncology

## 2014-05-19 ENCOUNTER — Inpatient Hospital Stay (HOSPITAL_COMMUNITY): Payer: Managed Care, Other (non HMO)

## 2014-05-19 ENCOUNTER — Other Ambulatory Visit (HOSPITAL_COMMUNITY): Payer: Managed Care, Other (non HMO)

## 2014-05-19 LAB — ERYTHROPOIETIN: ERYTHROPOIETIN: 24.1 m[IU]/mL — AB (ref 2.6–18.5)

## 2014-05-26 ENCOUNTER — Inpatient Hospital Stay (HOSPITAL_COMMUNITY): Payer: Managed Care, Other (non HMO)

## 2014-05-31 NOTE — Assessment & Plan Note (Signed)
Last B12 injection was given on 04/14/2014.  Due for injection today.

## 2014-05-31 NOTE — Assessment & Plan Note (Signed)
Right apex of lung nodule (7.5 mm) and left upper lobe lesion (9 mm), concerning for malignancy treated with SBRT by Dr. [name] on 2/23, 2/25, 3/3, and 08/02/2013.

## 2014-05-31 NOTE — Assessment & Plan Note (Signed)
GU blood loss.

## 2014-05-31 NOTE — Progress Notes (Signed)
-  Rescheduled-  KEFALAS,THOMAS  

## 2014-05-31 NOTE — Assessment & Plan Note (Signed)
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  CEA most recently WNL. CT abd/pelvis demonstrates stable disease.  Restart Cetuximab as planned with pre-chemotherapy labs.  Return in 4 weeks for follow-up

## 2014-05-31 NOTE — Assessment & Plan Note (Signed)
Chronic.  Secondary to cirrhosis of liver and splenomegaly.

## 2014-06-02 ENCOUNTER — Inpatient Hospital Stay (HOSPITAL_COMMUNITY): Payer: Managed Care, Other (non HMO)

## 2014-06-03 ENCOUNTER — Ambulatory Visit (HOSPITAL_COMMUNITY): Payer: Managed Care, Other (non HMO) | Admitting: Oncology

## 2014-06-06 ENCOUNTER — Other Ambulatory Visit: Payer: Self-pay | Admitting: Urology

## 2014-06-06 ENCOUNTER — Ambulatory Visit (INDEPENDENT_AMBULATORY_CARE_PROVIDER_SITE_OTHER): Payer: Self-pay | Admitting: Urology

## 2014-06-06 DIAGNOSIS — N312 Flaccid neuropathic bladder, not elsewhere classified: Secondary | ICD-10-CM

## 2014-06-06 DIAGNOSIS — N135 Crossing vessel and stricture of ureter without hydronephrosis: Secondary | ICD-10-CM

## 2014-06-06 DIAGNOSIS — N200 Calculus of kidney: Secondary | ICD-10-CM

## 2014-06-06 DIAGNOSIS — R339 Retention of urine, unspecified: Secondary | ICD-10-CM

## 2014-06-07 NOTE — Assessment & Plan Note (Addendum)
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  CEA most recently WNL. CT abd/pelvis demonstrates stable disease.  Restart Cetuximab (with reloading) as planned with pre-chemotherapy labs.  Return in 2 weeks for follow-up

## 2014-06-07 NOTE — Assessment & Plan Note (Signed)
Last B12 injection was given on 04/14/2014.  Due for injection today.

## 2014-06-07 NOTE — Progress Notes (Signed)
[name] Kilts, MD [place] [address]  Adenocarcinoma of rectum - Plan: HYDROcodone-acetaminophen (NORCO/VICODIN) 5-325 MG per tablet  Secondary malignancy of right lung  Thrombocytopenia  Acute blood loss anemia  B12 deficiency - Plan: HYDROcodone-acetaminophen (NORCO/VICODIN) 5-325 MG per tablet  Insomnia - Plan: temazepam (RESTORIL) 15 MG capsule  Shoulder pain, right - Plan: HYDROcodone-acetaminophen (NORCO/VICODIN) 5-325 MG per tablet  URI (upper respiratory infection) - Plan: HYDROcodone-acetaminophen (NORCO/VICODIN) 5-325 MG per tablet  Nephrolithiasis  CURRENT THERAPY: Erbitux single-agent beginning on (03/17/14)  INTERVAL HISTORY: [name] 64 y.o. 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     Adenocarcinoma of rectum   07/07/2010 Initial Diagnosis Adenocarcinoma of rectum   02/24/2014 Progression CT CAP-  Findings within the chest are worrisome for recurrent tumor with bilateral pulmonary metastasis.   03/17/2014 - 04/21/2014 Chemotherapy Erbitux single agent   04/28/2014 - 05/18/2014 Hospital Admission Hematuria,  AKI (acute kidney injury),  Obstructive uropathy,  Anemia,  Metabolic acidosis. S/p left PCN. Removed 12/20 by GU. Received 20 units of PRBCs and 11 units of platelets during this admission   06/09/2014 -  Chemotherapy Re-load with single-agent Erbitux   I personally reviewed and went over laboratory results with the patient.  The results are noted within this dictation.  I personally reviewed and went over radiographic studies with the patient.  The results are noted within this dictation.    Chart reviewed.  Hospitalization reviewed.  Please see notes from that hospitalization.  He reports that he feels well.  He does have a left percutaneous urostomy that is cleanly dressed.  He has an upcoming appointment with [name] on Friday.  He feels well. He  spent a lot of time discussing his extended hospital stay.  He reports that it was a pleasant stay for him.  He is grateful for the care he received.   He does note some problems with insomnia.  He reports that in the hospital he got a "little red pill for sleeping."  I suspect it was Ambien.  He notes that it was mildly effective for him.  He reports that we used to give him a sleeping medication that was more effective for him.  On chart review, I suspect it was Restoril 15 mg.  I will write a new Rx for this.    Oncologically, he denies any complaints and ROS questioning is negative.    Past Medical History  Diagnosis Date  . Pancytopenia, acquired 12/04/2010  . B12 deficiency 12/07/2010  . Kidney stones   . CAD (coronary artery disease)   . Anemia   . History of radiation therapy 06/19/07 -07/27/07    whole pelvis  . Hx of radiation therapy 08/06/12,08/09/12,& 08/13/12    rul [id number]/[id number]  . Cancer     Colon ca dx 2008 surg/rad/chemo  . Lung cancer   . FH: chemotherapy   . Myocardial infarction   . Rectal bleeding 09/20/2013    From recurrent rectal mass/recurrent rectal adenocarcinoma  . Acute blood loss anemia 09/27/2013  . Adenocarcinoma of rectum 07/07/2010    Qualifier: History of  By: [name], [name]  Initially presented with Stage III adeno of colon.  2.7 cm primary, moderately differentiated with 3/8 positive lymph nodes without LVI.  Surgery was on 04/22/2007 and was treated postoperatively with radiation and concomitant capecitabine and then 4 cycles of a planned 6 with oxaliplatin and  capecitabine.  His counts would not allow treatment of  . Hydronephrosis of left kidney 09/24/2013    Kidney stones; s/p cystoscopy and stent  . Gross hematuria 09/25/2103  . Lung metastases 07/09/2013  . Secondary malignancy of right lung 07/09/2013  . Insomnia 06/09/2014    has DIABETES MELLITUS, TYPE II; HYPERCHOLESTEROLEMIA; Deficiency anemia; CORONARY ARTERY DISEASE; SLEEP APNEA; Adenocarcinoma  of rectum; Pancytopenia, acquired; B12 deficiency; Port catheter in place; Hx of radiation therapy; Thrombocytopenia; Secondary malignancy of right lung; Rectal bleeding; ARF (acute renal failure); Hydronephrosis; Hydronephrosis of left kidney; GI bleeding; Acute blood loss anemia; Urinary retention; Gross hematuria; AKI (acute kidney injury); Anemia; Hyperkalemia; Nephrolithiasis; and Insomnia on his problem list.     is allergic to daypro and xeloda.  [name] had no medications administered during this visit.  Past Surgical History  Procedure Laterality Date  . Ileostomy  12/07/2010    Procedure: ILEOSTOMY;  Surgeon: [name];  Location: [facility];  Service: General;  Laterality: N/A;  Diverting Ileostomy Lysis of adhesions Exploratory Laparotomy  . Lung biopsy  10/27/10    rt lobe- adenocarcinoma  . Cholecystectomy    . Cataract extraction w/ intraocular lens implant  2007    bil  . Lithotripsy  2002  . Coronary angioplasty with stent placement  2003    stenting x 2  . Colon resection  2008    x2  . Esophagogastroduodenoscopy N/A 05/02/2013    Dr. Gala [name]- polypoid antral fold, bx= reactive gastropathy  . Givens capsule study N/A 05/02/2013    Procedure: GIVENS CAPSULE STUDY;  Surgeon: [name] Dolin, MD;  Location: [facility];  Service: Endoscopy;  Laterality: N/A;  . Colonoscopy N/A 09/20/2013    SLF:A near circumferential fungating mass with friable surfaces was found in the rectum.  ACTIVELY OOZING.  CLOTS SEEN IN LUMEN AND ASPIRATED.  Multiple biopsies were performed using cold forceps.  Thermal therapy(BICAP 7 Fr 25W) was used to ATTEMPT TO control bleeding.    HEMOSTASIS NOT ACHEIVED.  Marland Kitchen Cystoscopy w/ ureteral stent placement Left 09/24/2013    Procedure: CYSTOSCOPY WITH LEFT RETROGRADE PYELOGRAM; LEFT URETERAL STENT PLACEMENT; REMOVAL OF SMALL BLADDER CALCULI;  Surgeon: [name] Nestle, MD;  Location: [facility];  Service: Urology;  Laterality: Left;  . Flexible sigmoidoscopy  N/A 09/25/2013    DVV:[id number] exophytic rectal mass most likely representing recurrent adenocarcinoma status post biopsy  . Cystoscopy with retrograde pyelogram, ureteroscopy and stent placement Left 05/08/2014    Procedure: CYSTOSCOPY/ EVACUATION OF CLOTS  WITH LEFT RETROGRADE PYELOGRAM,LEFT URETEROSCOPY ;  Surgeon: Malka So, MD;  Location: [facility];  Service: Urology;  Laterality: Left;  . Nephrolithotomy Left 05/13/2014    Procedure: LEFT NEPHROLITHOTOMY PERCUTANEOUS FIRST STAGE;  Surgeon: Malka So, MD;  Location: [facility];  Service: Urology;  Laterality: Left;  . Holmium laser application Left [id number]/4854    Procedure: HOLMIUM LASER APPLICATION;  Surgeon: Malka So, MD;  Location: [facility];  Service: Urology;  Laterality: Left;    Denies any headaches, dizziness, double vision, fevers, chills, night sweats, nausea, vomiting, diarrhea, constipation, chest pain, heart palpitations, shortness of breath, blood in stool, black tarry stool, urinary pain, urinary burning, urinary frequency, hematuria.   PHYSICAL EXAMINATION  ECOG PERFORMANCE STATUS: 1 - Symptomatic but completely ambulatory  Filed Vitals:   06/09/14 0947  BP: 119/68  Pulse: 71  Temp: 98.9 F (37.2 C)  Resp: 18    GENERAL:alert, no distress, well nourished, well developed, comfortable, cooperative and  smiling SKIN: skin color, texture, turgor are normal, no rashes or significant lesions HEAD: Normocephalic, No masses, lesions, tenderness or abnormalities EYES: normal, PERRLA, EOMI, Conjunctiva are pink and non-injected EARS: External ears normal OROPHARYNX:lips, buccal mucosa, and tongue normal and mucous membranes are moist  NECK: supple, no adenopathy, thyroid normal size, non-tender, without nodularity, no stridor, non-tender, trachea midline LYMPH:  no palpable lymphadenopathy BREAST:not examined LUNGS: clear to auscultation and percussion HEART: regular rate & rhythm, no murmurs, no gallops, S1 normal and S2  normal ABDOMEN:abdomen soft, non-tender, normal bowel sounds and colostomy producing stool appropriately BACK: Back symmetric, no curvature., No CVA tenderness, left percutaneous nephrostomy noted with clean dressing. EXTREMITIES:less then 2 second capillary refill, no joint deformities, effusion, or inflammation, no skin discoloration, no cyanosis  NEURO: alert & oriented x 3 with fluent speech, no focal motor/sensory deficits, gait normal   LABORATORY DATA: CBC    Component Value Date/Time   WBC 2.4* 05/18/2014 0540   RBC 2.72* 05/18/2014 0540   HGB 8.3* 05/18/2014 0540   HCT 25.6* 05/18/2014 0540   PLT 39* 05/18/2014 0540   MCV 94.1 05/18/2014 0540   MCH 30.5 05/18/2014 0540   MCHC 32.4 05/18/2014 0540   RDW 19.2* 05/18/2014 0540   LYMPHSABS 0.3* 04/29/2014 1700   MONOABS 0.2 04/29/2014 1700   EOSABS 0.1 04/29/2014 1700   BASOSABS 0.0 04/29/2014 1700      Chemistry      Component Value Date/Time   NA 139 06/09/2014 0952   K 3.9 06/09/2014 0952   CL 116* 06/09/2014 0952   CO2 19 06/09/2014 0952   BUN 15 06/09/2014 0952   CREATININE 1.79* 06/09/2014 0952      Component Value Date/Time   CALCIUM 8.5 06/09/2014 0952   ALKPHOS 136* 06/09/2014 0952   AST 30 06/09/2014 0952   ALT 20 06/09/2014 0952   BILITOT 1.0 06/09/2014 0952     Lab Results  Component Value Date   CEA 4.6 04/14/2014    RADIOGRAPHIC STUDIES:  Ct Abdomen Pelvis Wo Contrast  05/14/2014   CLINICAL DATA:  Hematuria. Left nephrolithotomy yesterday. Renal calculus.  EXAM: CT ABDOMEN AND PELVIS WITHOUT CONTRAST  TECHNIQUE: Multidetector CT imaging of the abdomen and pelvis was performed following the standard protocol without IV contrast.  COMPARISON:  Multiple exams, including 04/28/2014  FINDINGS: Lower chest: Small subpleural nodules in the right middle lobe measure up to 4 mm. Trace bilateral pleural effusions. Low-density blood pool suggests anemia. Lymph node with fatty hilum adjacent to the distal  esophagus. Small epicardial lymph nodes are present. Coronary atherosclerosis.  Hepatobiliary: Small size the lateral segment left hepatic lobe with irregular high density within an along segment 3 of the liver, significance uncertain, but I favor cirrhosis. Possible recanalized umbilical vein.  Pancreas: Unremarkable  Spleen: Mild splenomegaly, splenic volume 600 cc. Mild prominence of the splenic vein.  Adrenals/Urinary Tract: Bilateral hypodense renal lesions favoring cysts. Large nephrostomy tube noted with high density fluid in the left collecting system and in internal stent or tube tracking through the nephrostomy tube and down the left ureter to the urinary bladder. The prior 1.5 cm left collecting system calculus is no longer present. No left ureteral calculus observed. There is a questionable 1-2 mm left mid kidney calculus on image 34 of series 2. Stable calculi air observed in the right renal collecting system, largest 0.8 cm in long axis on image 33 of series 2. No right hydronephrosis or hydroureter. Both ureters deviated medially towards the  cicatricial reaction and what may be a rectal stump. A Foley catheter is present in the urinary bladder which demonstrates nondistention and mild wall thickening which may primarily be due to the nondistention.  Stomach/Bowel: Complicated appearance in the pelvis. 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 Much of this could be from a prior radiation therapy and appear similar to prior exams ; there are loops of bowel adjacent to the into the rectal stump and can activity is difficult to completely exclude, correlate with operative history. There is some narrowing or cicatricial reaction near the is suspected and of the rectal stump, drying the ureters medially.   There also some scattered air-fluid levels in nondilated loops of small bowel. Anastomotic staple line noted below the umbilicus along a loop of central abdominal bowel.  Vascular/Lymphatic: Aortoiliac atherosclerotic vascular disease.  Reproductive: Mild enlargement and marginal indistinctness of the prostate gland, although this is chronic and suspected the marginal indistinctness is due to the generalized perirectal stranding.  Other: Scattered mild ascites. Diffuse low-grade mesenteric edema. Considerable perirectal stranding/edema.  Musculoskeletal: Mild anterior wedging of T11 and T12 appears chronic. Degenerative facet arthropathy on the right at L4-5.  IMPRESSION: 1. High density fluid in the left collecting system could represent some residual contrast although blood in the collecting system is certainly a differential diagnostic consideration. Prior large left collecting system and left proximal ureteral calculi are no longer present; there is left nephrostomy tube and a stent extending down to the urinary bladder. No significant renal capsular hematoma; there is right nonobstructive nephrolithiasis and bilateral renal cysts. 2. Hepatic cirrhosis with splenomegaly. 3. Extensive perirectal stranding, similar to prior, with some cicatricial reaction around what is thought to be the end of a rectal stump causing medial deviation of the ureters. 4. 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 There is scattered air- fluid levels in relatively nondilated loops of small bowel, along with a small amount of ascites and small bilateral pleural effusions. 5. Low-density blood pool compatible with anemia 6. Additional findings include small subpleural nodules in the right middle lobe which seems stable ; Coronary atherosclerosis ; and diffuse low-grade mesenteric edema.   Electronically Signed   By: Sherryl Barters M.D.   On: 05/14/2014 09:40   Ir Nephrostogram Left  05/16/2014   CLINICAL  DATA:  64 year old male with a history of left-sided nephrolithiasis. Left-sided percutaneous nephrostomy was placed 05/03/2014 for OR nephrolithotomy.  The patient has had hematuria after surgery. He has been referred for antegrade nephrostogram and potential PCNU placement  EXAM: LEFT ANTEGRADE NEPHROSTOGRAM THROUGH INDWELLING TUBE.  LEFT-SIDED EXCHANGE OF NEPHRO URETERAL TUBE FOR A NEW PERCUTANEOUS NEPHRO URETERAL DRAIN  FLUOROSCOPY TIME:  6 min, 30 seconds seconds  TECHNIQUE: The procedure risks, benefits, and alternatives were explained to the patient. Questions regarding the procedure were encouraged and answered. The patient understands and consents to the procedure.  The patient was positioned in prone position on the fluoroscopy table and the left flank and indwelling tubes were prepped and draped in the usual sterile fashion.  Antegrade nephrostogram was performed through the red rubber catheter after scout images were acquired.  A Kumpe the catheter, Bentson wire, standard Glidewire combination was used to navigate through the red rubber catheter, thrombus in the collecting system, and into the ureter. Antegrade nephrostogram images were taken  of the length of the ureter into the pelvis. An occlusion was encountered in the distal third of the ureter.  A 0.035 Bentson wire was then navigated through the multipurpose catheter which was in the urinary bladder. This catheter was used to measure the length of the ureter for percutaneous nephro ureteral tube placement. 26 cm was estimated.  The Bentson wire was then readvanced into the urinary bladder and the multipurpose catheter was removed. A new 10 French nephro ureteral tube was advanced to the urinary bladder and the wire removed. The distal loop was formed in the urinary bladder and the proximal loop was formed in the collecting system.  Findings of the case and the plan were discussed with [name] of Urology at the time of the procedure. We decided to  leave the red rubber catheter in place given the thrombus in the collecting system.  Final images were stored.  No complications were encountered no significant blood loss was encountered.  Patient tolerated the procedure well and remained hemodynamically stable throughout.  COMPLICATIONS: None  FINDINGS: Scout images demonstrate 52 French red rubber catheter terminating in the collecting system of the left kidney. A multipurpose catheter traverses the left flank and terminates in the pelvis overlying the urinary bladder.  Antegrade nephrostogram through the red rubber catheter demonstrates the catheter terminates in the pelvis of the left kidney. Contrast partially fills the lower pole collecting system. Multiple ill-defined filling defects present within the pelvis, without contrast traversing the ureteropelvic junction.  Further contrast injection through a Kumpe the catheter that was navigated into the left ureter demonstrates obstruction in the distal third of the left ureter. Multiple filling defects are present within the ureter, likely representing blood clots/debris.  Further images demonstrate exchange of the multipurpose catheter from the urinary bladder for a new percutaneous nephro ureteral tube.  Final images demonstrate percutaneous nephro ureteral tube with the proximal loop formed in the region of the renal pelvis and the distal loop formed in the region of the urinary bladder. The red rubber catheter remains in position within the renal pelvis to a drainage bag.  IMPRESSION: Status post antegrade nephrostogram through indwelling red rubber catheter demonstrates multiple filling defects in the pelvis of the left kidney compatible with blood clots/debris. Obstruction of the left ureter at the distal third, which is known.  Status post exchange of multipurpose catheter from the left ureter for a new, 10 French percutaneous nephro ureteral stent. Distal loop formed within the urinary bladder and  proximal loop formed in the region of the collecting system.  PLAN: Findings were discussed with Dr [name] of urology at the time of the procedure. The red rubber catheter was left in position to a drainage bag to help evacuate blood clots that are present.  While the patient's urinary Foley is in position, the nephro ureteral drain/stent can be capped. The tube should be and capped if the urethral Foley is removed.  Signed,  Dulcy Fanny. Earleen Newport, DO  [facility]  [facility]   Electronically Signed   By: Corrie Mckusick D.O.   On: 05/16/2014 17:40   Dg C-arm Gt 120 Min-no Report  05/13/2014   CLINICAL DATA: kidney stones   C-ARM GT 120 MINUTE  Fluoroscopy was utilized by the requesting physician.  No radiographic  interpretation.    Ir Ext Nephroureteral Cath Exchange  05/16/2014   CLINICAL DATA:  64 year old male with a history of left-sided nephrolithiasis. Left-sided percutaneous nephrostomy was placed 05/03/2014 for  OR nephrolithotomy.  The patient has had hematuria after surgery. He has been referred for antegrade nephrostogram and potential PCNU placement  EXAM: LEFT ANTEGRADE NEPHROSTOGRAM THROUGH INDWELLING TUBE.  LEFT-SIDED EXCHANGE OF NEPHRO URETERAL TUBE FOR A NEW PERCUTANEOUS NEPHRO URETERAL DRAIN  FLUOROSCOPY TIME:  6 min, 30 seconds seconds  TECHNIQUE: The procedure risks, benefits, and alternatives were explained to the patient. Questions regarding the procedure were encouraged and answered. The patient understands and consents to the procedure.  The patient was positioned in prone position on the fluoroscopy table and the left flank and indwelling tubes were prepped and draped in the usual sterile fashion.  Antegrade nephrostogram was performed through the red rubber catheter after scout images were acquired.  A Kumpe the catheter, Bentson wire, standard Glidewire combination was used to navigate through the red rubber catheter, thrombus in the  collecting system, and into the ureter. Antegrade nephrostogram images were taken of the length of the ureter into the pelvis. An occlusion was encountered in the distal third of the ureter.  A 0.035 Bentson wire was then navigated through the multipurpose catheter which was in the urinary bladder. This catheter was used to measure the length of the ureter for percutaneous nephro ureteral tube placement. 26 cm was estimated.  The Bentson wire was then readvanced into the urinary bladder and the multipurpose catheter was removed. A new 10 French nephro ureteral tube was advanced to the urinary bladder and the wire removed. The distal loop was formed in the urinary bladder and the proximal loop was formed in the collecting system.  Findings of the case and the plan were discussed with [name] of Urology at the time of the procedure. We decided to leave the red rubber catheter in place given the thrombus in the collecting system.  Final images were stored.  No complications were encountered no significant blood loss was encountered.  Patient tolerated the procedure well and remained hemodynamically stable throughout.  COMPLICATIONS: None  FINDINGS: Scout images demonstrate 52 French red rubber catheter terminating in the collecting system of the left kidney. A multipurpose catheter traverses the left flank and terminates in the pelvis overlying the urinary bladder.  Antegrade nephrostogram through the red rubber catheter demonstrates the catheter terminates in the pelvis of the left kidney. Contrast partially fills the lower pole collecting system. Multiple ill-defined filling defects present within the pelvis, without contrast traversing the ureteropelvic junction.  Further contrast injection through a Kumpe the catheter that was navigated into the left ureter demonstrates obstruction in the distal third of the left ureter. Multiple filling defects are present within the ureter, likely representing blood  clots/debris.  Further images demonstrate exchange of the multipurpose catheter from the urinary bladder for a new percutaneous nephro ureteral tube.  Final images demonstrate percutaneous nephro ureteral tube with the proximal loop formed in the region of the renal pelvis and the distal loop formed in the region of the urinary bladder. The red rubber catheter remains in position within the renal pelvis to a drainage bag.  IMPRESSION: Status post antegrade nephrostogram through indwelling red rubber catheter demonstrates multiple filling defects in the pelvis of the left kidney compatible with blood clots/debris. Obstruction of the left ureter at the distal third, which is known.  Status post exchange of multipurpose catheter from the left ureter for a new, 10 French percutaneous nephro ureteral stent. Distal loop formed within the urinary bladder and proximal loop formed in the region of the collecting system.  PLAN: Findings were  discussed with Dr [name] of urology at the time of the procedure. The red rubber catheter was left in position to a drainage bag to help evacuate blood clots that are present.  While the patient's urinary Foley is in position, the nephro ureteral drain/stent can be capped. The tube should be and capped if the urethral Foley is removed.  Signed,  Dulcy Fanny. Earleen Newport, DO  [facility]  [facility]   Electronically Signed   By: Corrie Mckusick D.O.   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  CEA most recently WNL. CT abd/pelvis demonstrates stable disease.  Restart Cetuximab (with reloading) as planned with pre-chemotherapy labs.  Return in 2 weeks for follow-up    Secondary malignancy of right lung Right apex of lung nodule (7.5 mm) and left upper lobe lesion (9 mm),  concerning for malignancy treated with SBRT by Dr. [name] on 2/23, 2/25, 3/3, and 08/02/2013.     Thrombocytopenia Chronic.  Secondary to cirrhosis of liver and splenomegaly.    Acute blood loss anemia GU blood loss.    B12 deficiency Last B12 injection was given on 04/14/2014.  Due for injection today.    Insomnia Rx for Restoril provided today.  Nephrolithiasis Rx for Hydrocodone   THERAPY PLAN:  Will continue with salvage Cetuximab therapy with excellent tolerability and response biochemically thus far.  All questions were answered. The patient knows to call the clinic with any problems, questions or concerns. We can certainly see the patient much sooner if necessary.  Patient and plan discussed with Dr. Ancil Linsey and she is in agreement with the aforementioned.   Ameirah Khatoon 06/09/2014

## 2014-06-07 NOTE — Assessment & Plan Note (Signed)
GU blood loss.

## 2014-06-07 NOTE — Assessment & Plan Note (Signed)
Right apex of lung nodule (7.5 mm) and left upper lobe lesion (9 mm), concerning for malignancy treated with SBRT by Dr. [name] on 2/23, 2/25, 3/3, and 08/02/2013.

## 2014-06-07 NOTE — Assessment & Plan Note (Signed)
Chronic.  Secondary to cirrhosis of liver and splenomegaly.

## 2014-06-09 ENCOUNTER — Other Ambulatory Visit (HOSPITAL_COMMUNITY): Payer: Self-pay | Admitting: Oncology

## 2014-06-09 ENCOUNTER — Encounter (HOSPITAL_BASED_OUTPATIENT_CLINIC_OR_DEPARTMENT_OTHER): Payer: Managed Care, Other (non HMO) | Admitting: Oncology

## 2014-06-09 ENCOUNTER — Encounter (HOSPITAL_COMMUNITY): Payer: Self-pay | Admitting: Oncology

## 2014-06-09 ENCOUNTER — Encounter (HOSPITAL_COMMUNITY): Payer: Managed Care, Other (non HMO) | Attending: Oncology

## 2014-06-09 VITALS — BP 119/68 | HR 71 | Temp 98.9°F | Resp 18 | Wt 198.2 lb

## 2014-06-09 DIAGNOSIS — Z5112 Encounter for antineoplastic immunotherapy: Secondary | ICD-10-CM | POA: Diagnosis not present

## 2014-06-09 DIAGNOSIS — C787 Secondary malignant neoplasm of liver and intrahepatic bile duct: Secondary | ICD-10-CM

## 2014-06-09 DIAGNOSIS — C7801 Secondary malignant neoplasm of right lung: Secondary | ICD-10-CM | POA: Insufficient documentation

## 2014-06-09 DIAGNOSIS — D619 Aplastic anemia, unspecified: Secondary | ICD-10-CM | POA: Diagnosis present

## 2014-06-09 DIAGNOSIS — C20 Malignant neoplasm of rectum: Secondary | ICD-10-CM

## 2014-06-09 DIAGNOSIS — M25511 Pain in right shoulder: Secondary | ICD-10-CM

## 2014-06-09 DIAGNOSIS — J069 Acute upper respiratory infection, unspecified: Secondary | ICD-10-CM

## 2014-06-09 DIAGNOSIS — D62 Acute posthemorrhagic anemia: Secondary | ICD-10-CM

## 2014-06-09 DIAGNOSIS — D539 Nutritional anemia, unspecified: Secondary | ICD-10-CM

## 2014-06-09 DIAGNOSIS — D696 Thrombocytopenia, unspecified: Secondary | ICD-10-CM | POA: Diagnosis not present

## 2014-06-09 DIAGNOSIS — E538 Deficiency of other specified B group vitamins: Secondary | ICD-10-CM | POA: Insufficient documentation

## 2014-06-09 DIAGNOSIS — G4700 Insomnia, unspecified: Secondary | ICD-10-CM

## 2014-06-09 DIAGNOSIS — N200 Calculus of kidney: Secondary | ICD-10-CM

## 2014-06-09 HISTORY — DX: Insomnia, unspecified: G47.00

## 2014-06-09 LAB — CBC WITH DIFFERENTIAL/PLATELET
Basophils Absolute: 0 10*3/uL (ref 0.0–0.1)
Basophils Relative: 0 % (ref 0–1)
EOS ABS: 0.1 10*3/uL (ref 0.0–0.7)
Eosinophils Relative: 3 % (ref 0–5)
HCT: 25.7 % — ABNORMAL LOW (ref 39.0–52.0)
Hemoglobin: 8.5 g/dL — ABNORMAL LOW (ref 13.0–17.0)
Lymphocytes Relative: 12 % (ref 12–46)
Lymphs Abs: 0.5 10*3/uL — ABNORMAL LOW (ref 0.7–4.0)
MCH: 31.7 pg (ref 26.0–34.0)
MCHC: 33.1 g/dL (ref 30.0–36.0)
MCV: 95.9 fL (ref 78.0–100.0)
Monocytes Absolute: 0.4 10*3/uL (ref 0.1–1.0)
Monocytes Relative: 10 % (ref 3–12)
NEUTROS PCT: 75 % (ref 43–77)
Neutro Abs: 2.9 10*3/uL (ref 1.7–7.7)
Platelets: 66 10*3/uL — ABNORMAL LOW (ref 150–400)
RBC: 2.68 MIL/uL — AB (ref 4.22–5.81)
RDW: 19.5 % — ABNORMAL HIGH (ref 11.5–15.5)
WBC: 3.8 10*3/uL — ABNORMAL LOW (ref 4.0–10.5)

## 2014-06-09 LAB — MAGNESIUM: Magnesium: 1.8 mg/dL (ref 1.5–2.5)

## 2014-06-09 LAB — COMPREHENSIVE METABOLIC PANEL
ALT: 20 U/L (ref 0–53)
AST: 30 U/L (ref 0–37)
Albumin: 2.7 g/dL — ABNORMAL LOW (ref 3.5–5.2)
Alkaline Phosphatase: 136 U/L — ABNORMAL HIGH (ref 39–117)
Anion gap: 4 — ABNORMAL LOW (ref 5–15)
BILIRUBIN TOTAL: 1 mg/dL (ref 0.3–1.2)
BUN: 15 mg/dL (ref 6–23)
CO2: 19 mmol/L (ref 19–32)
Calcium: 8.5 mg/dL (ref 8.4–10.5)
Chloride: 116 mEq/L — ABNORMAL HIGH (ref 96–112)
Creatinine, Ser: 1.79 mg/dL — ABNORMAL HIGH (ref 0.50–1.35)
GFR, EST AFRICAN AMERICAN: 45 mL/min — AB (ref >90)
GFR, EST NON AFRICAN AMERICAN: 39 mL/min — AB (ref >90)
Glucose, Bld: 96 mg/dL (ref 70–99)
Potassium: 3.9 mmol/L (ref 3.5–5.1)
Sodium: 139 mmol/L (ref 135–145)
Total Protein: 6.1 g/dL (ref 6.0–8.3)

## 2014-06-09 MED ORDER — HYDROCODONE-ACETAMINOPHEN 5-325 MG PO TABS: 1.0000 | ORAL_TABLET | Freq: Four times a day (QID) | ORAL | Status: DC | PRN

## 2014-06-09 MED ORDER — DIPHENHYDRAMINE HCL 50 MG/ML IJ SOLN
INTRAMUSCULAR | Status: AC
  Filled 2014-06-09: qty 1

## 2014-06-09 MED ORDER — TEMAZEPAM 15 MG PO CAPS: 15.0000 mg | ORAL_CAPSULE | Freq: Every evening | ORAL | Status: DC | PRN

## 2014-06-09 MED ORDER — DIPHENHYDRAMINE HCL 50 MG/ML IJ SOLN
50.0000 mg | Freq: Once | INTRAMUSCULAR | Status: AC
  Administered 2014-06-09: 50 mg via INTRAVENOUS

## 2014-06-09 MED ORDER — HEPARIN SOD (PORK) LOCK FLUSH 100 UNIT/ML IV SOLN
500.0000 [IU] | Freq: Once | INTRAVENOUS | Status: AC | PRN
  Administered 2014-06-09: 500 [IU]
  Filled 2014-06-09: qty 5

## 2014-06-09 MED ORDER — SODIUM CHLORIDE 0.9 % IJ SOLN
10.0000 mL | INTRAMUSCULAR | Status: DC | PRN
  Administered 2014-06-09: 10 mL
  Filled 2014-06-09: qty 10

## 2014-06-09 MED ORDER — CETUXIMAB CHEMO IV INJECTION 200 MG/100ML
400.0000 mg/m2 | Freq: Once | INTRAVENOUS | Status: AC
  Administered 2014-06-09: 900 mg via INTRAVENOUS
  Filled 2014-06-09: qty 450

## 2014-06-09 MED ORDER — SODIUM CHLORIDE 0.9 % IV SOLN
Freq: Once | INTRAVENOUS | Status: AC
  Administered 2014-06-09: 11:00:00 via INTRAVENOUS

## 2014-06-09 NOTE — Assessment & Plan Note (Signed)
Rx for Restoril provided today.

## 2014-06-09 NOTE — Patient Instructions (Signed)
[facility] Discharge Instructions for Patients Receiving Chemotherapy  Today you received the following chemotherapy agents Erbitux (loading dose). Return weekly for Erbitux infusion as scheduled. Report any dermatologic reaction as soon as you notice any. To help prevent nausea and vomiting after your treatment, we encourage you to take your nausea medication as instructed.  If you develop nausea and vomiting that is not controlled by your nausea medication, call the clinic. If it is after clinic hours your family physician or the after hours number for the clinic or go to the Emergency Department. BELOW ARE SYMPTOMS THAT SHOULD BE REPORTED IMMEDIATELY:  *FEVER GREATER THAN 101.0 F  *CHILLS WITH OR WITHOUT FEVER  NAUSEA AND VOMITING THAT IS NOT CONTROLLED WITH YOUR NAUSEA MEDICATION  *UNUSUAL SHORTNESS OF BREATH  *UNUSUAL BRUISING OR BLEEDING  TENDERNESS IN MOUTH AND THROAT WITH OR WITHOUT PRESENCE OF ULCERS  *URINARY PROBLEMS  *BOWEL PROBLEMS  UNUSUAL RASH Items with * indicate a potential emergency and should be followed up as soon as possible. One of the nurses will contact you 24 hours after your treatment. Please let the nurse know about any problems that you may have experienced. Feel free to call the clinic you have any questions or concerns. The clinic phone number is [phone]. Return as scheduled.  I have been informed and understand all the instructions given to me. I know to contact the clinic, my physician, or go to the Emergency Department if any problems should occur. I do not have any questions at this time, but understand that I may call the clinic during office hours or the Patient Navigator at [phone] should I have any questions or need assistance in obtaining follow up care.    __________________________________________  _____________  __________ Signature of Patient or Authorized Representative            Date                    Time    __________________________________________ Nurse's Signature

## 2014-06-09 NOTE — Assessment & Plan Note (Signed)
Rx for Hydrocodone

## 2014-06-09 NOTE — Progress Notes (Signed)
Tolerated chemo well. 

## 2014-06-09 NOTE — Patient Instructions (Signed)
[facility] Discharge Instructions  RECOMMENDATIONS MADE BY THE CONSULTANT AND ANY TEST RESULTS WILL BE SENT TO YOUR REFERRING PHYSICIAN.  Two Rxs given today: 1. Temazepam 15-30 mg at bedtime for sleeping 2. Refill on Hydrocodone  We will treat with therapy today as planned with a reloading dose.  Return in 2 weeks for follow-up appointment.  Follow-up with Dr. Jeffie Pollock as scheduled.   Thank you for choosing [place] to  provide your oncology and hematology care. To afford each patient quality  time with our provider, please arrive at least 15 minutes before your  scheduled appointment time.  You need to re-schedule your appointment should you arrive 10 or more  minutes late. We strive to give you quality time with our providers, and  arriving late affects you and other patients whose appointments are after  yours. Also, if you no show three or more times for appointments you may  be dismissed from the clinic at the providers discretion.  Again, thank you for choosing [facility]. Our hope is that  these requests will decrease the amount of time that you wait before being seen by our physicians. _______________________________________________________________  Should you have questions after your visit to [facility], please contact our office at [phone] between the hours of 8:30 a.m. and 5:00 p.m.  Voicemails left after 4:30 p.m. will not be returned until the following business day.  For prescription refill requests, have your pharmacy contact our office with your prescription refill request.

## 2014-06-10 ENCOUNTER — Encounter (HOSPITAL_COMMUNITY): Payer: Self-pay | Admitting: Emergency Medicine

## 2014-06-10 LAB — CEA: CEA: 11.8 ng/mL — ABNORMAL HIGH (ref 0.0–4.7)

## 2014-06-10 NOTE — Progress Notes (Signed)
24 hour follow up phone call to patient states that he done well last night and today.  States he was a little tired last night night but has felt better today.

## 2014-06-11 ENCOUNTER — Other Ambulatory Visit: Payer: Self-pay | Admitting: Radiology

## 2014-06-12 ENCOUNTER — Encounter (HOSPITAL_COMMUNITY): Payer: Self-pay | Admitting: Urology

## 2014-06-13 ENCOUNTER — Ambulatory Visit (HOSPITAL_COMMUNITY)
Admission: RE | Admit: 2014-06-13 | Discharge: 2014-06-13 | Disposition: A | Discharge status: Home / Self Care | Payer: Managed Care, Other (non HMO) | Source: Ambulatory Visit | Attending: Urology | Admitting: Urology

## 2014-06-13 ENCOUNTER — Encounter (HOSPITAL_COMMUNITY): Payer: Self-pay

## 2014-06-13 ENCOUNTER — Encounter (HOSPITAL_COMMUNITY): Payer: Self-pay | Admitting: Hematology & Oncology

## 2014-06-13 ENCOUNTER — Telehealth: Payer: Self-pay | Admitting: Nutrition

## 2014-06-13 ENCOUNTER — Other Ambulatory Visit: Payer: Self-pay | Admitting: Urology

## 2014-06-13 DIAGNOSIS — Z7952 Long term (current) use of systemic steroids: Secondary | ICD-10-CM | POA: Diagnosis not present

## 2014-06-13 DIAGNOSIS — Z932 Ileostomy status: Secondary | ICD-10-CM | POA: Diagnosis not present

## 2014-06-13 DIAGNOSIS — Z792 Long term (current) use of antibiotics: Secondary | ICD-10-CM | POA: Insufficient documentation

## 2014-06-13 DIAGNOSIS — Z79899 Other long term (current) drug therapy: Secondary | ICD-10-CM | POA: Diagnosis not present

## 2014-06-13 DIAGNOSIS — Z85038 Personal history of other malignant neoplasm of large intestine: Secondary | ICD-10-CM | POA: Diagnosis not present

## 2014-06-13 DIAGNOSIS — Z85118 Personal history of other malignant neoplasm of bronchus and lung: Secondary | ICD-10-CM | POA: Diagnosis not present

## 2014-06-13 DIAGNOSIS — N201 Calculus of ureter: Secondary | ICD-10-CM | POA: Insufficient documentation

## 2014-06-13 DIAGNOSIS — Z79891 Long term (current) use of opiate analgesic: Secondary | ICD-10-CM | POA: Diagnosis not present

## 2014-06-13 DIAGNOSIS — N135 Crossing vessel and stricture of ureter without hydronephrosis: Secondary | ICD-10-CM

## 2014-06-13 DIAGNOSIS — I252 Old myocardial infarction: Secondary | ICD-10-CM | POA: Diagnosis not present

## 2014-06-13 DIAGNOSIS — I2510 Atherosclerotic heart disease of native coronary artery without angina pectoris: Secondary | ICD-10-CM | POA: Diagnosis not present

## 2014-06-13 DIAGNOSIS — Z87891 Personal history of nicotine dependence: Secondary | ICD-10-CM | POA: Diagnosis present

## 2014-06-13 LAB — BASIC METABOLIC PANEL
Anion gap: 8 (ref 5–15)
BUN: 18 mg/dL (ref 6–23)
CO2: 18 mmol/L — ABNORMAL LOW (ref 19–32)
Calcium: 8.6 mg/dL (ref 8.4–10.5)
Chloride: 112 mEq/L (ref 96–112)
Creatinine, Ser: 2.16 mg/dL — ABNORMAL HIGH (ref 0.50–1.35)
GFR calc non Af Amer: 31 mL/min — ABNORMAL LOW (ref >90)
GFR, EST AFRICAN AMERICAN: 36 mL/min — AB (ref >90)
Glucose, Bld: 86 mg/dL (ref 70–99)
POTASSIUM: 4.1 mmol/L (ref 3.5–5.1)
Sodium: 138 mmol/L (ref 135–145)

## 2014-06-13 LAB — CBC WITH DIFFERENTIAL/PLATELET
BASOS ABS: 0 10*3/uL (ref 0.0–0.1)
Basophils Relative: 0 % (ref 0–1)
EOS ABS: 0.1 10*3/uL (ref 0.0–0.7)
EOS PCT: 2 % (ref 0–5)
HEMATOCRIT: 27.6 % — AB (ref 39.0–52.0)
Hemoglobin: 9 g/dL — ABNORMAL LOW (ref 13.0–17.0)
Lymphocytes Relative: 11 % — ABNORMAL LOW (ref 12–46)
Lymphs Abs: 0.5 10*3/uL — ABNORMAL LOW (ref 0.7–4.0)
MCH: 31 pg (ref 26.0–34.0)
MCHC: 32.6 g/dL (ref 30.0–36.0)
MCV: 95.2 fL (ref 78.0–100.0)
Monocytes Absolute: 0.4 10*3/uL (ref 0.1–1.0)
Monocytes Relative: 10 % (ref 3–12)
NEUTROS PCT: 78 % — AB (ref 43–77)
Neutro Abs: 3.6 10*3/uL (ref 1.7–7.7)
Platelets: 63 10*3/uL — ABNORMAL LOW (ref 150–400)
RBC: 2.9 MIL/uL — AB (ref 4.22–5.81)
RDW: 18.4 % — AB (ref 11.5–15.5)
WBC: 4.6 10*3/uL (ref 4.0–10.5)

## 2014-06-13 LAB — PROTIME-INR
INR: 1.15 (ref 0.00–1.49)
Prothrombin Time: 14.8 seconds (ref 11.6–15.2)

## 2014-06-13 LAB — APTT: aPTT: 35 seconds (ref 24–37)

## 2014-06-13 MED ORDER — FENTANYL CITRATE 0.05 MG/ML IJ SOLN
INTRAMUSCULAR | Status: AC | PRN
  Administered 2014-06-13: 50 ug via INTRAVENOUS

## 2014-06-13 MED ORDER — CEFAZOLIN SODIUM-DEXTROSE 2-3 GM-% IV SOLR
INTRAVENOUS | Status: DC
Start: 2014-06-13 — End: 2014-06-14
  Filled 2014-06-13: qty 50

## 2014-06-13 MED ORDER — MIDAZOLAM HCL 2 MG/2ML IJ SOLN
INTRAMUSCULAR | Status: AC
  Filled 2014-06-13: qty 6

## 2014-06-13 MED ORDER — SODIUM CHLORIDE 0.9 % IV SOLN
INTRAVENOUS | Status: DC
  Administered 2014-06-13: 12:00:00 via INTRAVENOUS

## 2014-06-13 MED ORDER — FENTANYL CITRATE 0.05 MG/ML IJ SOLN
INTRAMUSCULAR | Status: AC
  Filled 2014-06-13: qty 6

## 2014-06-13 MED ORDER — LIDOCAINE HCL 1 % IJ SOLN
INTRAMUSCULAR | Status: AC
  Filled 2014-06-13: qty 20

## 2014-06-13 MED ORDER — IOHEXOL 300 MG/ML  SOLN
25.0000 mL | Freq: Once | INTRAMUSCULAR | Status: AC | PRN
  Administered 2014-06-13: 10 mL

## 2014-06-13 MED ORDER — CEFAZOLIN SODIUM-DEXTROSE 2-3 GM-% IV SOLR
2.0000 g | INTRAVENOUS | Status: AC
  Administered 2014-06-13: 2 g via INTRAVENOUS

## 2014-06-13 MED ORDER — MIDAZOLAM HCL 2 MG/2ML IJ SOLN
INTRAMUSCULAR | Status: AC | PRN
  Administered 2014-06-13 (×2): 1 mg via INTRAVENOUS

## 2014-06-13 MED ORDER — HEPARIN SOD (PORK) LOCK FLUSH 100 UNIT/ML IV SOLN
500.0000 [IU] | INTRAVENOUS | Status: AC | PRN
  Administered 2014-06-13: 500 [IU]
  Filled 2014-06-13: qty 5

## 2014-06-13 NOTE — Procedures (Signed)
Interventional Radiology Procedure Note  Procedure:  1. Left antegrade nephrourertogram.   2. Placement 59F 22 cm double J ureteral stent 3. Removal left NUT Complications: None Recommendations: - Follow up with Dr. Jeffie Pollock for ureteral stent management  Signed,  Criselda Peaches, MD

## 2014-06-13 NOTE — H&P (Signed)
Chief Complaint: "I am here for a procedure on my left kidney tube."  Referring Physician(s): Wrenn,John J  History of Present Illness: [name] is a 64 y.o. male with history of left nephrolithiasis s/p PCNL and scheduled today for nephrostogram and possible internalization. He states his PCN has been capped and his foley is in place draining urine. He denies any chest pain, shortness of breath or palpitations. He denies any active signs of bleeding or excessive bruising. He denies any recent fever or chills. The patient denies any history of sleep apnea or chronic oxygen use. He has previously tolerated sedation without complications.    Past Medical History  Diagnosis Date  . Pancytopenia, acquired 12/04/2010  . B12 deficiency 12/07/2010  . Kidney stones   . CAD (coronary artery disease)   . Anemia   . History of radiation therapy 06/19/07 -07/27/07    whole pelvis  . Hx of radiation therapy 08/06/12,08/09/12,& 08/13/12    rul 54GY/42fx  . Cancer     Colon ca dx 2008 surg/rad/chemo  . Lung cancer   . FH: chemotherapy   . Myocardial infarction   . Rectal bleeding 09/20/2013    From recurrent rectal mass/recurrent rectal adenocarcinoma  . Acute blood loss anemia 09/27/2013  . 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  2.7 cm primary, moderately differentiated with 3/8 positive lymph nodes without LVI.  Surgery was on 04/22/2007 and was treated postoperatively with radiation and concomitant capecitabine and then 4 cycles of a planned 6 with oxaliplatin and capecitabine.  His counts would not allow treatment of  . Hydronephrosis of left kidney 09/24/2013    Kidney stones; s/p cystoscopy and stent  . Gross hematuria 09/25/2103  . Lung metastases 07/09/2013  . Secondary malignancy of right lung 07/09/2013  . Insomnia 06/09/2014    Past Surgical History  Procedure Laterality Date  .  Ileostomy  12/07/2010    Procedure: ILEOSTOMY;  Surgeon: Donato Heinz;  Location: [facility] ORS;  Service: General;  Laterality: N/A;  Diverting Ileostomy Lysis of adhesions Exploratory Laparotomy  . Lung biopsy  10/27/10    rt lobe- adenocarcinoma  . Cholecystectomy    . Cataract extraction w/ intraocular lens implant  2007    bil  . Lithotripsy  2002  . Coronary angioplasty with stent placement  2003    stenting x 2  . Colon resection  2008    x2  . Esophagogastroduodenoscopy N/A 05/02/2013    Dr. Gala Romney- polypoid antral fold, bx= reactive gastropathy  . Givens capsule study N/A 05/02/2013    Procedure: GIVENS CAPSULE STUDY;  Surgeon: Daneil Dolin, MD;  Location: [facility] ENDO SUITE;  Service: Endoscopy;  Laterality: N/A;  . Colonoscopy N/A 09/20/2013    SLF:A near circumferential fungating mass with friable surfaces was found in the rectum.  ACTIVELY OOZING.  CLOTS SEEN IN LUMEN AND ASPIRATED.  Multiple biopsies were performed using cold forceps.  Thermal therapy(BICAP 7 Fr 25W) was used to ATTEMPT TO control bleeding.    HEMOSTASIS NOT ACHEIVED.  Marland Kitchen Cystoscopy w/ ureteral stent placement Left 09/24/2013    Procedure: CYSTOSCOPY WITH LEFT RETROGRADE PYELOGRAM; LEFT URETERAL STENT PLACEMENT; REMOVAL OF SMALL BLADDER CALCULI;  Surgeon: Marissa Nestle, MD;  Location: [facility] ORS;  Service: Urology;  Laterality: Left;  . Flexible sigmoidoscopy N/A 09/25/2013    VEL:[id number] exophytic rectal mass most likely representing recurrent  adenocarcinoma status post biopsy  . Cystoscopy with retrograde pyelogram, ureteroscopy and stent placement Left 05/08/2014    Procedure: CYSTOSCOPY/ EVACUATION OF CLOTS  WITH LEFT RETROGRADE PYELOGRAM,LEFT URETEROSCOPY ;  Surgeon: Malka So, MD;  Location: [facility] ORS;  Service: Urology;  Laterality: Left;  . Nephrolithotomy Left 05/13/2014    Procedure: LEFT NEPHROLITHOTOMY PERCUTANEOUS FIRST STAGE;  Surgeon: Malka So, MD;  Location: [facility] ORS;  Service: Urology;  Laterality: Left;    . Holmium laser application Left 10/96/0454    Procedure: HOLMIUM LASER APPLICATION;  Surgeon: Malka So, MD;  Location: [facility] ORS;  Service: Urology;  Laterality: Left;    Allergies: Daypro and Xeloda  Medications: Prior to Admission medications   Medication Sig Start Date End Date Taking? Authorizing Provider  amoxicillin (AMOXIL) 250 MG capsule Take 1 capsule (250 mg total) by mouth every 12 (twelve) hours. 05/18/14  Yes Erline Hau, MD  amoxicillin-clavulanate (AUGMENTIN) 875-125 MG per tablet Take 1 tablet by mouth 2 (two) times daily.   Yes Historical Provider, MD  clindamycin (CLEOCIN-T) 1 % lotion Apply topically 2 (two) times daily. 04/07/14  Yes Manon Hilding Kefalas, PA-C  cyanocobalamin (,VITAMIN B-12,) 1000 MCG/ML injection Inject 1,000 mcg into the muscle every 30 (thirty) days.    Yes Historical Provider, MD  folic acid (FOLVITE) 1 MG tablet Take 1 tablet (1 mg total) by mouth daily. 04/28/14  Yes Baird Cancer, PA-C  HYDROcodone-acetaminophen (NORCO/VICODIN) 5-325 MG per tablet Take 1-2 tablets by mouth every 6 (six) hours as needed. Patient taking differently: Take 1-2 tablets by mouth every 6 (six) hours as needed for moderate pain.  06/09/14  Yes Manon Hilding Kefalas, PA-C  hydrocortisone-urea (CARMOL-HC) 1-10 % cream Apply topically 3 (three) times daily. 04/22/14  Yes Farrel Gobble, MD  lidocaine-prilocaine (EMLA) cream Apply a quarter size amount to port site 1 hour prior to chemo. Do not rub in. Cover with plastic wrap. 03/13/14  Yes Baird Cancer, PA-C  omeprazole (PRILOSEC) 20 MG capsule Take 1 capsule (20 mg total) by mouth daily. 09/28/13  Yes Rexene Alberts, MD  temazepam (RESTORIL) 15 MG capsule Take 1-2 capsules (15-30 mg total) by mouth at bedtime as needed for sleep. 06/09/14  Yes Baird Cancer, PA-C  hydrocortisone cream 1 % Apply 1 application topically 2 (two) times daily. Mix with urea cream and apply to affected areas. Patient not taking:  Reported on 06/10/2014 04/23/14   Manon Hilding Kefalas, PA-C  ondansetron (ZOFRAN) 8 MG tablet Take 1 tablet (8 mg total) by mouth 2 (two) times daily as needed for nausea or vomiting. Patient not taking: Reported on 06/09/2014 03/13/14   Manon Hilding Kefalas, PA-C  urea (CARMOL) 10 % cream Apply topically as needed. Mix with hydrocortisone cream and apply to affected areas. Patient not taking: Reported on 06/10/2014 04/23/14   Baird Cancer, PA-C    Family History  Problem Relation Age of Onset  . Cervical cancer Mother   . Rectal cancer Father 39  . Uterine cancer Sister     History   Social History  . Marital Status: Married    Spouse Name: N/A    Number of Children: 4  . Years of Education: N/A   Social History Main Topics  . Smoking status: Former Smoker -- 2.00 packs/day for 30 years    Types: Cigarettes, Cigars  . Smokeless tobacco: Former Systems developer    Types: Chew  . Alcohol Use: No     Comment: last  drink 08/2013  . Drug Use: No  . Sexual Activity: Not Currently   Other Topics Concern  . None   Social History Narrative    Review of Systems: A 12 point ROS discussed and pertinent positives are indicated in the HPI above.  All other systems are negative.  Review of Systems  Vital Signs: BP 114/71 mmHg  Pulse 78  Temp(Src) 98.3 F (36.8 C)  Resp 18  Ht 5\' 10"  (1.778 m)  Wt 198 lb (89.812 kg)  BMI 28.41 kg/m2  SpO2 100%  Physical Exam  Constitutional: He is oriented to person, place, and time. No distress.  HENT:  Head: Normocephalic and atraumatic.  Neck: No tracheal deviation present.  Cardiovascular: Normal rate and regular rhythm.  Exam reveals no friction rub.   No murmur heard. Pulmonary/Chest: Effort normal and breath sounds normal. No respiratory distress. He has no wheezes. He has no rales.  Abdominal: Soft.  Left PCN capped, dressing C/D/I.  Neurological: He is alert and oriented to person, place, and time.  Skin: He is not diaphoretic.  Foley in place  draining yellow/amber urine  Imaging: Ir Nephrostogram Left  05/16/2014   CLINICAL DATA:  64 year old male with a history of left-sided nephrolithiasis. Left-sided percutaneous nephrostomy was placed 05/03/2014 for OR nephrolithotomy.  The patient has had hematuria after surgery. He has been referred for antegrade nephrostogram and potential PCNU placement  EXAM: LEFT ANTEGRADE NEPHROSTOGRAM THROUGH INDWELLING TUBE.  LEFT-SIDED EXCHANGE OF NEPHRO URETERAL TUBE FOR A NEW PERCUTANEOUS NEPHRO URETERAL DRAIN  FLUOROSCOPY TIME:  6 min, 30 seconds seconds  TECHNIQUE: The procedure risks, benefits, and alternatives were explained to the patient. Questions regarding the procedure were encouraged and answered. The patient understands and consents to the procedure.  The patient was positioned in prone position on the fluoroscopy table and the left flank and indwelling tubes were prepped and draped in the usual sterile fashion.  Antegrade nephrostogram was performed through the red rubber catheter after scout images were acquired.  A Kumpe the catheter, Bentson wire, standard Glidewire combination was used to navigate through the red rubber catheter, thrombus in the collecting system, and into the ureter. Antegrade nephrostogram images were taken of the length of the ureter into the pelvis. An occlusion was encountered in the distal third of the ureter.  A 0.035 Bentson wire was then navigated through the multipurpose catheter which was in the urinary bladder. This catheter was used to measure the length of the ureter for percutaneous nephro ureteral tube placement. 26 cm was estimated.  The Bentson wire was then readvanced into the urinary bladder and the multipurpose catheter was removed. A new 10 French nephro ureteral tube was advanced to the urinary bladder and the wire removed. The distal loop was formed in the urinary bladder and the proximal loop was formed in the collecting system.  Findings of the case and the  plan were discussed with Dr. Jeffie Pollock of Urology at the time of the procedure. We decided to leave the red rubber catheter in place given the thrombus in the collecting system.  Final images were stored.  No complications were encountered no significant blood loss was encountered.  Patient tolerated the procedure well and remained hemodynamically stable throughout.  COMPLICATIONS: None  FINDINGS: Scout images demonstrate 94 French red rubber catheter terminating in the collecting system of the left kidney. A multipurpose catheter traverses the left flank and terminates in the pelvis overlying the urinary bladder.  Antegrade nephrostogram through the red rubber catheter  demonstrates the catheter terminates in the pelvis of the left kidney. Contrast partially fills the lower pole collecting system. Multiple ill-defined filling defects present within the pelvis, without contrast traversing the ureteropelvic junction.  Further contrast injection through a Kumpe the catheter that was navigated into the left ureter demonstrates obstruction in the distal third of the left ureter. Multiple filling defects are present within the ureter, likely representing blood clots/debris.  Further images demonstrate exchange of the multipurpose catheter from the urinary bladder for a new percutaneous nephro ureteral tube.  Final images demonstrate percutaneous nephro ureteral tube with the proximal loop formed in the region of the renal pelvis and the distal loop formed in the region of the urinary bladder. The red rubber catheter remains in position within the renal pelvis to a drainage bag.  IMPRESSION: Status post antegrade nephrostogram through indwelling red rubber catheter demonstrates multiple filling defects in the pelvis of the left kidney compatible with blood clots/debris. Obstruction of the left ureter at the distal third, which is known.  Status post exchange of multipurpose catheter from the left ureter for a new, 10 French  percutaneous nephro ureteral stent. Distal loop formed within the urinary bladder and proximal loop formed in the region of the collecting system.  PLAN: Findings were discussed with Dr Jeffie Pollock of urology at the time of the procedure. The red rubber catheter was left in position to a drainage bag to help evacuate blood clots that are present.  While the patient's urinary Foley is in position, the nephro ureteral drain/stent can be capped. The tube should be and capped if the urethral Foley is removed.  Signed,  Dulcy Fanny. Earleen Newport, DO  [facility]  [facility]   Electronically Signed   By: Corrie Mckusick D.O.   On: 05/16/2014 17:40   Ir Ext Nephroureteral Cath Exchange  05/16/2014   CLINICAL DATA:  64 year old male with a history of left-sided nephrolithiasis. Left-sided percutaneous nephrostomy was placed 05/03/2014 for OR nephrolithotomy.  The patient has had hematuria after surgery. He has been referred for antegrade nephrostogram and potential PCNU placement  EXAM: LEFT ANTEGRADE NEPHROSTOGRAM THROUGH INDWELLING TUBE.  LEFT-SIDED EXCHANGE OF NEPHRO URETERAL TUBE FOR A NEW PERCUTANEOUS NEPHRO URETERAL DRAIN  FLUOROSCOPY TIME:  6 min, 30 seconds seconds  TECHNIQUE: The procedure risks, benefits, and alternatives were explained to the patient. Questions regarding the procedure were encouraged and answered. The patient understands and consents to the procedure.  The patient was positioned in prone position on the fluoroscopy table and the left flank and indwelling tubes were prepped and draped in the usual sterile fashion.  Antegrade nephrostogram was performed through the red rubber catheter after scout images were acquired.  A Kumpe the catheter, Bentson wire, standard Glidewire combination was used to navigate through the red rubber catheter, thrombus in the collecting system, and into the ureter. Antegrade nephrostogram images were taken of the length of the ureter  into the pelvis. An occlusion was encountered in the distal third of the ureter.  A 0.035 Bentson wire was then navigated through the multipurpose catheter which was in the urinary bladder. This catheter was used to measure the length of the ureter for percutaneous nephro ureteral tube placement. 26 cm was estimated.  The Bentson wire was then readvanced into the urinary bladder and the multipurpose catheter was removed. A new 10 French nephro ureteral tube was advanced to the urinary bladder and the wire removed. The distal loop was formed in the urinary bladder  and the proximal loop was formed in the collecting system.  Findings of the case and the plan were discussed with Dr. Jeffie Pollock of Urology at the time of the procedure. We decided to leave the red rubber catheter in place given the thrombus in the collecting system.  Final images were stored.  No complications were encountered no significant blood loss was encountered.  Patient tolerated the procedure well and remained hemodynamically stable throughout.  COMPLICATIONS: None  FINDINGS: Scout images demonstrate 71 French red rubber catheter terminating in the collecting system of the left kidney. A multipurpose catheter traverses the left flank and terminates in the pelvis overlying the urinary bladder.  Antegrade nephrostogram through the red rubber catheter demonstrates the catheter terminates in the pelvis of the left kidney. Contrast partially fills the lower pole collecting system. Multiple ill-defined filling defects present within the pelvis, without contrast traversing the ureteropelvic junction.  Further contrast injection through a Kumpe the catheter that was navigated into the left ureter demonstrates obstruction in the distal third of the left ureter. Multiple filling defects are present within the ureter, likely representing blood clots/debris.  Further images demonstrate exchange of the multipurpose catheter from the urinary bladder for a new  percutaneous nephro ureteral tube.  Final images demonstrate percutaneous nephro ureteral tube with the proximal loop formed in the region of the renal pelvis and the distal loop formed in the region of the urinary bladder. The red rubber catheter remains in position within the renal pelvis to a drainage bag.  IMPRESSION: Status post antegrade nephrostogram through indwelling red rubber catheter demonstrates multiple filling defects in the pelvis of the left kidney compatible with blood clots/debris. Obstruction of the left ureter at the distal third, which is known.  Status post exchange of multipurpose catheter from the left ureter for a new, 10 French percutaneous nephro ureteral stent. Distal loop formed within the urinary bladder and proximal loop formed in the region of the collecting system.  PLAN: Findings were discussed with Dr Jeffie Pollock of urology at the time of the procedure. The red rubber catheter was left in position to a drainage bag to help evacuate blood clots that are present.  While the patient's urinary Foley is in position, the nephro ureteral drain/stent can be capped. The tube should be and capped if the urethral Foley is removed.  Signed,  Dulcy Fanny. Earleen Newport, DO  [facility]  [facility]   Electronically Signed   By: Corrie Mckusick D.O.   On: 05/16/2014 17:40    Labs:  CBC:  Recent Labs  05/16/14 0510 05/17/14 0555 05/18/14 0540 06/09/14 0952  WBC 3.0* 2.7* 2.4* 3.8*  HGB 8.9* 8.6* 8.3* 8.5*  HCT 27.5* 26.7* 25.6* 25.7*  PLT 37* 36* 39* 66*    COAGS:  Recent Labs  09/10/13 1250  05/03/14 0500 05/12/14 0310 05/13/14 0515 06/13/14 1210  INR 1.16  < > 1.33 1.10 1.06 1.15  APTT 38*  --   --  25  --  35  < > = values in this interval not displayed.  BMP:  Recent Labs  05/16/14 0510 05/17/14 0555 05/18/14 0540 06/09/14 0952  NA 142 138 142 139  K 4.1 3.7 3.8 3.9  CL 113* 110 114* 116*  CO2 21 20 19 19   GLUCOSE 73 80  71 96  BUN 18 15 12 15   CALCIUM 8.0* 8.0* 8.0* 8.5  CREATININE 1.90* 1.82* 1.77* 1.79*  GFRNONAA 36* 38* 40* 39*  GFRAA 42* 45*  46* 45*    LIVER FUNCTION TESTS:  Recent Labs  05/16/14 0510 05/17/14 0555 05/18/14 0540 06/09/14 0952  BILITOT 1.6* 0.9 1.0 1.0  AST 25 19 22 30   ALT 31 25 23 20   ALKPHOS 115 111 117 136*  PROT 4.9* 4.8* 4.6* 6.1  ALBUMIN 2.1* 2.0* 2.0* 2.7*    TUMOR MARKERS:  Recent Labs  03/17/14 0832 04/07/14 0927 04/14/14 0900 06/09/14 0952  CEA 8.9* 6.6* 4.6 11.8*    Assessment and Plan: History of left sided nephrolithiasis s/p PCNL Scheduled today for nephrostogram and possible internalization Patient has been NPO, labs reviewed, foley in place draining yellow/amber urine, left PCN capped Risks and Benefits discussed with the patient. All of the patient's questions were answered, patient is agreeable to proceed. Consent signed and in chart.    SignedHedy Jacob 06/13/2014, 12:49 PM

## 2014-06-13 NOTE — Telephone Encounter (Signed)
Patient was identified to be at risk for malnutrition on the MST secondary to poor appetite and weight loss. Contacted patient by phone but he was unavailable. Left message with my name and phone number for patient to return my call.  **Disclaimer: This note was dictated with voice recognition software. Similar sounding words can inadvertently be transcribed and this note may contain transcription errors which may not have been corrected upon publication of note.**

## 2014-06-13 NOTE — Progress Notes (Addendum)
Pt arrived today fo procedure in Radiology. He had occlusive dressing left flank area. CDI. Wife and patient say they have" changed dressing last night and it does drain a little". Pt also had a foley cath to leg bag draining tea colored urine and colostomy bag.

## 2014-06-13 NOTE — Discharge Instructions (Signed)
Conscious Sedation, Adult, Care After Refer to this sheet in the next few weeks. These instructions provide you with information on caring for yourself after your procedure. Your health care provider may also give you more specific instructions. Your treatment has been planned according to current medical practices, but problems sometimes occur. Call your health care provider if you have any problems or questions after your procedure. WHAT TO EXPECT AFTER THE PROCEDURE  After your procedure:  You may feel sleepy, clumsy, and have poor balance for several hours.  Vomiting may occur if you eat too soon after the procedure. HOME CARE INSTRUCTIONS  Do not participate in any activities where you could become injured for at least 24 hours. Do not:  Drive.  Swim.  Ride a bicycle.  Operate heavy machinery.  Cook.  Use power tools.  Climb ladders.  Work from a high place.  Do not make important decisions or sign legal documents until you are improved.  If you vomit, drink water, juice, or soup when you can drink without vomiting. Make sure you have little or no nausea before eating solid foods.  Only take over-the-counter or prescription medicines for pain, discomfort, or fever as directed by your health care provider.  Make sure you and your family fully understand everything about the medicines given to you, including what side effects may occur.  You should not drink alcohol, take sleeping pills, or take medicines that cause drowsiness for at least 24 hours.  If you smoke, do not smoke without supervision.  If you are feeling better, you may resume normal activities 24 hours after you were sedated.  Keep all appointments with your health care provider. SEEK MEDICAL CARE IF:  Your skin is pale or bluish in color.  You continue to feel nauseous or vomit.  Your pain is getting worse and is not helped by medicine.  You have bleeding or swelling.  You are still sleepy or  feeling clumsy after 24 hours. SEEK IMMEDIATE MEDICAL CARE IF:  You develop a rash.  You have difficulty breathing.  You develop any type of allergic problem.  You have a fever. MAKE SURE YOU:  Understand these instructions.  Will watch your condition.  Will get help right away if you are not doing well or get worse. Document Released: 03/06/2013 Document Reviewed: 03/06/2013 [facility] Patient Information 2015 [place], [place]. This information is not intended to replace advice given to you by your health care provider. Make sure you discuss any questions you have with your health care provider.

## 2014-06-16 ENCOUNTER — Encounter (HOSPITAL_COMMUNITY): Payer: Self-pay

## 2014-06-16 ENCOUNTER — Encounter (HOSPITAL_BASED_OUTPATIENT_CLINIC_OR_DEPARTMENT_OTHER): Payer: Managed Care, Other (non HMO)

## 2014-06-16 DIAGNOSIS — D539 Nutritional anemia, unspecified: Secondary | ICD-10-CM

## 2014-06-16 DIAGNOSIS — E538 Deficiency of other specified B group vitamins: Secondary | ICD-10-CM

## 2014-06-16 DIAGNOSIS — D510 Vitamin B12 deficiency anemia due to intrinsic factor deficiency: Secondary | ICD-10-CM | POA: Diagnosis not present

## 2014-06-16 DIAGNOSIS — C20 Malignant neoplasm of rectum: Secondary | ICD-10-CM | POA: Diagnosis not present

## 2014-06-16 DIAGNOSIS — Z5112 Encounter for antineoplastic immunotherapy: Secondary | ICD-10-CM

## 2014-06-16 DIAGNOSIS — C7801 Secondary malignant neoplasm of right lung: Secondary | ICD-10-CM

## 2014-06-16 LAB — CBC WITH DIFFERENTIAL/PLATELET
BASOS ABS: 0 10*3/uL (ref 0.0–0.1)
BASOS PCT: 0 % (ref 0–1)
EOS PCT: 2 % (ref 0–5)
Eosinophils Absolute: 0.1 10*3/uL (ref 0.0–0.7)
HCT: 28.2 % — ABNORMAL LOW (ref 39.0–52.0)
Hemoglobin: 9.1 g/dL — ABNORMAL LOW (ref 13.0–17.0)
Lymphocytes Relative: 11 % — ABNORMAL LOW (ref 12–46)
Lymphs Abs: 0.5 10*3/uL — ABNORMAL LOW (ref 0.7–4.0)
MCH: 31.4 pg (ref 26.0–34.0)
MCHC: 32.3 g/dL (ref 30.0–36.0)
MCV: 97.2 fL (ref 78.0–100.0)
Monocytes Absolute: 0.3 10*3/uL (ref 0.1–1.0)
Monocytes Relative: 9 % (ref 3–12)
Neutro Abs: 3.1 10*3/uL (ref 1.7–7.7)
Neutrophils Relative %: 78 % — ABNORMAL HIGH (ref 43–77)
PLATELETS: 63 10*3/uL — AB (ref 150–400)
RBC: 2.9 MIL/uL — AB (ref 4.22–5.81)
RDW: 17.9 % — AB (ref 11.5–15.5)
WBC: 4 10*3/uL (ref 4.0–10.5)

## 2014-06-16 LAB — IRON AND TIBC
Iron: 112 ug/dL (ref 42–165)
Saturation Ratios: 58 % — ABNORMAL HIGH (ref 20–55)
TIBC: 192 ug/dL — AB (ref 215–435)
UIBC: 80 ug/dL — ABNORMAL LOW (ref 125–400)

## 2014-06-16 MED ORDER — SODIUM CHLORIDE 0.9 % IJ SOLN
10.0000 mL | INTRAMUSCULAR | Status: DC | PRN
  Administered 2014-06-16: 10 mL
  Filled 2014-06-16: qty 10

## 2014-06-16 MED ORDER — HEPARIN SOD (PORK) LOCK FLUSH 100 UNIT/ML IV SOLN
500.0000 [IU] | Freq: Once | INTRAVENOUS | Status: AC | PRN
  Administered 2014-06-16: 500 [IU]
  Filled 2014-06-16: qty 5

## 2014-06-16 MED ORDER — CETUXIMAB CHEMO IV INJECTION 200 MG/100ML
200.0000 mg/m2 | Freq: Once | INTRAVENOUS | Status: AC
  Administered 2014-06-16: 400 mg via INTRAVENOUS
  Filled 2014-06-16: qty 200

## 2014-06-16 MED ORDER — CYANOCOBALAMIN 1000 MCG/ML IJ SOLN
INTRAMUSCULAR | Status: AC
  Filled 2014-06-16: qty 1

## 2014-06-16 MED ORDER — SODIUM CHLORIDE 0.9 % IV SOLN
Freq: Once | INTRAVENOUS | Status: AC
  Administered 2014-06-16: 12:00:00 via INTRAVENOUS

## 2014-06-16 MED ORDER — DIPHENHYDRAMINE HCL 50 MG/ML IJ SOLN
50.0000 mg | Freq: Once | INTRAMUSCULAR | Status: AC
  Administered 2014-06-16: 50 mg via INTRAVENOUS
  Filled 2014-06-16: qty 1

## 2014-06-16 MED ORDER — CYANOCOBALAMIN 1000 MCG/ML IJ SOLN
1000.0000 ug | Freq: Once | INTRAMUSCULAR | Status: AC
  Administered 2014-06-16: 1000 ug via INTRAMUSCULAR

## 2014-06-16 NOTE — Progress Notes (Signed)
Rivaldo R Uhde Tolerated chemotherapy well, discharged ambulatory

## 2014-06-16 NOTE — Patient Instructions (Signed)
[facility] Discharge Instructions for Patients Receiving Chemotherapy  Today you received the following chemotherapy agents erbitux YOu received a Vitamin b12 injection today.  Follow up as scheduled.  Call the clinic if you have any questions or concerns  To help prevent nausea and vomiting after your treatment, we encourage you to take your nausea medication   If you develop nausea and vomiting that is not controlled by your nausea medication, call the clinic. If it is after clinic hours your family physician or the after hours number for the clinic or go to the Emergency Department.   BELOW ARE SYMPTOMS THAT SHOULD BE REPORTED IMMEDIATELY:  *FEVER GREATER THAN 101.0 F  *CHILLS WITH OR WITHOUT FEVER  NAUSEA AND VOMITING THAT IS NOT CONTROLLED WITH YOUR NAUSEA MEDICATION  *UNUSUAL SHORTNESS OF BREATH  *UNUSUAL BRUISING OR BLEEDING  TENDERNESS IN MOUTH AND THROAT WITH OR WITHOUT PRESENCE OF ULCERS  *URINARY PROBLEMS  *BOWEL PROBLEMS  UNUSUAL RASH Items with * indicate a potential emergency and should be followed up as soon as possible.  One of the nurses will contact you 24 hours after your treatment. Please let the nurse know about any problems that you may have experienced. Feel free to call the clinic you have any questions or concerns. The clinic phone number is [phone].   I have been informed and understand all the instructions given to me. I know to contact the clinic, my physician, or go to the Emergency Department if any problems should occur. I do not have any questions at this time, but understand that I may call the clinic during office hours or the Patient Navigator at [phone] should I have any questions or need assistance in obtaining follow up care.   Cetuximab injection What is this medicine? CETUXIMAB (se TUX i mab) is a chemotherapy drug. It targets a specific protein within cancer cells and stops the cells from growing. It is used  to treat colorectal cancer and head and neck cancer. This medicine may be used for other purposes; ask your health care provider or pharmacist if you have questions. COMMON BRAND NAME(S): Erbitux What should I tell my health care provider before I take this medicine? They need to know if you have any of these conditions: -heart disease -history of irregular heartbeat -history of low levels of calcium, magnesium, or potassium in the blood -lung or breathing disease, like asthma -an unusual or allergic reaction to cetuximab, other medicines, foods, dyes, or preservatives -pregnant or trying to get pregnant -breast-feeding How should I use this medicine? This drug is given as an infusion into a vein. It is administered in a hospital or clinic by a specially trained health care professional. Talk to your pediatrician regarding the use of this medicine in children. Special care may be needed. Overdosage: If you think you have taken too much of this medicine contact a poison control center or emergency room at once. NOTE: This medicine is only for you. Do not share this medicine with others. What if I miss a dose? It is important not to miss your dose. Call your doctor or health care professional if you are unable to keep an appointment. What may interact with this medicine? Interactions are not expected. This list may not describe all possible interactions. Give your health care provider a list of all the medicines, herbs, non-prescription drugs, or dietary supplements you use. Also tell them if you smoke, drink alcohol, or use illegal drugs. Some items  may interact with your medicine. What should I watch for while using this medicine? Visit your doctor or health care professional for regular checks on your progress. This drug may make you feel generally unwell. This is not uncommon, as chemotherapy can affect healthy cells as well as cancer cells. Report any side effects. Continue your course of  treatment even though you feel ill unless your doctor tells you to stop. This medicine can make you more sensitive to the sun. Keep out of the sun while taking this medicine and for 2 months after the last dose. If you cannot avoid being in the sun, wear protective clothing and use sunscreen. Do not use sun lamps or tanning beds/booths. You may need blood work done while you are taking this medicine. In some cases, you may be given additional medicines to help with side effects. Follow all directions for their use. Call your doctor or health care professional for advice if you get a fever, chills or sore throat, or other symptoms of a cold or flu. Do not treat yourself. This drug decreases your body's ability to fight infections. Try to avoid being around people who are sick. Avoid taking products that contain aspirin, acetaminophen, ibuprofen, naproxen, or ketoprofen unless instructed by your doctor. These medicines may hide a fever. Do not become pregnant while taking this medicine. Women should inform their doctor if they wish to become pregnant or think they might be pregnant. There is a potential for serious side effects to an unborn child. Use adequate birth control methods. Avoid pregnancy for at least 6 months after your last dose. Talk to your health care professional or pharmacist for more information. Do not breast-feed an infant while taking this medicine or during the 2 months after your last dose. What side effects may I notice from receiving this medicine? Side effects that you should report to your doctor or health care professional as soon as possible: -allergic reactions like skin rash, itching or hives, swelling of the face, lips, or tongue -breathing problems -changes in vision -fast, irregular heartbeat -feeling faint or lightheaded, falls -fever, chills -mouth sores -redness, blistering, peeling or loosening of the skin, including inside the mouth -trouble passing urine or  change in the amount of urine -unusually weak or tired Side effects that usually do not require medical attention (report to your doctor or health care professional if they continue or are bothersome): -changes in skin like acne, cracks, skin dryness -constipation -diarrhea -headache -nail changes -nausea, vomiting -stomach upset -weight loss This list may not describe all possible side effects. Call your doctor for medical advice about side effects. You may report side effects to FDA at 1-800-FDA-1088. Where should I keep my medicine? This drug is given in a hospital or clinic and will not be stored at home. NOTE: This sheet is a summary. It may not cover all possible information. If you have questions about this medicine, talk to your doctor, pharmacist, or health care provider.  2015, [employer] Standard. (2013-08-28 16:14:34)

## 2014-06-17 LAB — FERRITIN: FERRITIN: 1397 ng/mL — AB (ref 22–322)

## 2014-06-21 NOTE — Assessment & Plan Note (Signed)
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 Return in 2 weeks for follow-up.  Prechemo labs as planned.

## 2014-06-21 NOTE — Assessment & Plan Note (Signed)
Due for next B12 injection in 2 weeks

## 2014-06-21 NOTE — Progress Notes (Signed)
[name] Kilts, MD [place] [address]  Adenocarcinoma of rectum  B12 deficiency  Secondary malignancy of right lung  CURRENT THERAPY:Erbitux single-agent beginning on (03/17/14)   INTERVAL HISTORY: [name] 64 y.o. 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     Adenocarcinoma of rectum   07/07/2010 Initial Diagnosis Adenocarcinoma of rectum   02/24/2014 Progression CT CAP-  Findings within the chest are worrisome for recurrent tumor with bilateral pulmonary metastasis.   03/17/2014 - 04/21/2014 Chemotherapy Erbitux single agent   04/28/2014 - 05/18/2014 Hospital Admission Hematuria,  AKI (acute kidney injury),  Obstructive uropathy,  Anemia,  Metabolic acidosis. S/p left PCN. Removed 12/20 by GU. Received 20 units of PRBCs and 11 units of platelets during this admission   06/09/2014 -  Chemotherapy Re-load with single-agent Erbitux   I personally reviewed and went over laboratory results with the patient.  The results are noted within this dictation.  He is tolerating therapy well without any major complaints.  He denies any rash at this point.  He has an upcoming appointment with urology on 2/3.  Oncologically, he denies any complaints and ROS questioning is negative.  Past Medical History  Diagnosis Date  . Pancytopenia, acquired 12/04/2010  . B12 deficiency 12/07/2010  . Kidney stones   . CAD (coronary artery disease)   . Anemia   . History of radiation therapy 06/19/07 -07/27/07    whole pelvis  . Hx of radiation therapy 08/06/12,08/09/12,& 08/13/12    rul 56GY/82f  . Cancer     Colon ca dx 2008 surg/rad/chemo  . Lung cancer   . FH: chemotherapy   . Myocardial infarction   . Rectal bleeding 09/20/2013    From recurrent rectal mass/recurrent rectal adenocarcinoma  . Acute blood loss anemia 09/27/2013  . Adenocarcinoma of rectum 07/07/2010    Qualifier: History of  By: JRonnald Ramp FNP-BC, Kandice L  Initially presented with Stage III adeno of colon.  2.7 cm primary, moderately differentiated with 3/8 positive lymph nodes without LVI.  Surgery was on 04/22/2007 and was treated postoperatively with radiation and concomitant capecitabine and then 4 cycles of a planned 6 with oxaliplatin and capecitabine.  His counts would not allow treatment of  . Hydronephrosis of left kidney 09/24/2013    Kidney stones; s/p cystoscopy and stent  . Gross hematuria 09/25/2103  . Lung metastases 07/09/2013  . Secondary malignancy of right lung 07/09/2013  . Insomnia 06/09/2014    has DIABETES MELLITUS, TYPE II; HYPERCHOLESTEROLEMIA; Deficiency anemia; CORONARY ARTERY DISEASE; SLEEP APNEA; Adenocarcinoma of rectum; Pancytopenia, acquired; B12 deficiency; Port catheter in place; Hx of radiation therapy; Thrombocytopenia; Secondary malignancy of right lung; Rectal bleeding; ARF (acute renal failure); Hydronephrosis; Hydronephrosis of left kidney; GI bleeding; Acute blood loss anemia; Urinary retention; Gross hematuria; AKI (acute kidney injury); Anemia; Hyperkalemia; Nephrolithiasis; and Insomnia on his problem list.     is allergic to daypro and xeloda.  [name] currently have medications on file.  Past Surgical History  Procedure Laterality Date  . Ileostomy  12/07/2010    Procedure: ILEOSTOMY;  Surgeon: BDonato Heinz  Location: [facility];  Service: General;  Laterality: N/A;  Diverting Ileostomy Lysis of adhesions Exploratory Laparotomy  . Lung biopsy  10/27/10    rt lobe- adenocarcinoma  . Cholecystectomy    . Cataract extraction w/ intraocular lens implant  2007    bil  . Lithotripsy  2002  . Coronary angioplasty with stent placement  2003    stenting x 2  . Colon resection  2008    x2  . Esophagogastroduodenoscopy N/A 05/02/2013    Dr. Gala Romney- polypoid antral fold, bx= reactive gastropathy  . Givens capsule study N/A 05/02/2013    Procedure: GIVENS CAPSULE STUDY;  Surgeon:  Daneil Dolin, MD;  Location: [facility];  Service: Endoscopy;  Laterality: N/A;  . Colonoscopy N/A 09/20/2013    SLF:A near circumferential fungating mass with friable surfaces was found in the rectum.  ACTIVELY OOZING.  CLOTS SEEN IN LUMEN AND ASPIRATED.  Multiple biopsies were performed using cold forceps.  Thermal therapy(BICAP 7 Fr 25W) was used to ATTEMPT TO control bleeding.    HEMOSTASIS NOT ACHEIVED.  Marland Kitchen Cystoscopy w/ ureteral stent placement Left 09/24/2013    Procedure: CYSTOSCOPY WITH LEFT RETROGRADE PYELOGRAM; LEFT URETERAL STENT PLACEMENT; REMOVAL OF SMALL BLADDER CALCULI;  Surgeon: Marissa Nestle, MD;  Location: [facility];  Service: Urology;  Laterality: Left;  . Flexible sigmoidoscopy N/A 09/25/2013    KDX:IPJASNKNL exophytic rectal mass most likely representing recurrent adenocarcinoma status post biopsy  . Cystoscopy with retrograde pyelogram, ureteroscopy and stent placement Left 05/08/2014    Procedure: CYSTOSCOPY/ EVACUATION OF CLOTS  WITH LEFT RETROGRADE PYELOGRAM,LEFT URETEROSCOPY ;  Surgeon: Malka So, MD;  Location: [facility];  Service: Urology;  Laterality: Left;  . Nephrolithotomy Left 05/13/2014    Procedure: LEFT NEPHROLITHOTOMY PERCUTANEOUS FIRST STAGE;  Surgeon: Malka So, MD;  Location: [facility];  Service: Urology;  Laterality: Left;  . Holmium laser application Left 97/67/3419    Procedure: HOLMIUM LASER APPLICATION;  Surgeon: Malka So, MD;  Location: [facility];  Service: Urology;  Laterality: Left;    Denies any headaches, dizziness, double vision, fevers, chills, night sweats, nausea, vomiting, diarrhea, constipation, chest pain, heart palpitations, shortness of breath, blood in stool, black tarry stool, urinary pain, urinary burning, urinary frequency, hematuria.   PHYSICAL EXAMINATION  ECOG PERFORMANCE STATUS: 0 - Asymptomatic  Filed Vitals:   06/23/14 0926  BP: 105/76  Pulse: 68  Temp: 97.9 F (36.6 C)  Resp: 18    GENERAL:alert, no distress, well  nourished, well developed, comfortable, cooperative and smiling SKIN: skin color, texture, turgor are normal, no rashes or significant lesions HEAD: Normocephalic, No masses, lesions, tenderness or abnormalities EYES: normal, PERRLA, EOMI, Conjunctiva are pink and non-injected EARS: External ears normal OROPHARYNX:lips, buccal mucosa, and tongue normal and mucous membranes are moist  NECK: supple, no adenopathy, thyroid normal size, non-tender, without nodularity, no stridor, non-tender, trachea midline LYMPH:  no palpable lymphadenopathy, no hepatosplenomegaly BREAST:not examined LUNGS: clear to auscultation and percussion HEART: regular rate & rhythm, no murmurs, no gallops, S1 normal and S2 normal ABDOMEN:abdomen soft, non-tender, normal bowel sounds and colostomy producing stool BACK: Back symmetric, no curvature. EXTREMITIES:less then 2 second capillary refill, no joint deformities, effusion, or inflammation, no edema, no skin discoloration, no clubbing, no cyanosis  NEURO: alert & oriented x 3 with fluent speech, no focal motor/sensory deficits, gait normal    LABORATORY DATA: CBC    Component Value Date/Time   WBC 4.0 06/16/2014 1045   RBC 2.90* 06/16/2014 1045   HGB 9.1* 06/16/2014 1045   HCT 28.2* 06/16/2014 1045   PLT 63* 06/16/2014 1045   MCV 97.2 06/16/2014 1045   MCH 31.4 06/16/2014 1045   MCHC 32.3 06/16/2014 1045   RDW 17.9* 06/16/2014 1045   LYMPHSABS 0.5* 06/16/2014 1045   MONOABS  0.3 06/16/2014 1045   EOSABS 0.1 06/16/2014 1045   BASOSABS 0.0 06/16/2014 1045      Chemistry      Component Value Date/Time   NA 138 06/13/2014 1210   K 4.1 06/13/2014 1210   CL 112 06/13/2014 1210   CO2 18* 06/13/2014 1210   BUN 18 06/13/2014 1210   CREATININE 2.16* 06/13/2014 1210      Component Value Date/Time   CALCIUM 8.6 06/13/2014 1210   ALKPHOS 136* 06/09/2014 0952   AST 30 06/09/2014 0952   ALT 20 06/09/2014 0952   BILITOT 1.0 06/09/2014 0952      Lab  Results  Component Value Date   CEA 11.8* 06/09/2014    RADIOGRAPHIC STUDIES:  Ir Ureteral Stent Placement Existing Access Left  06/13/2014   CLINICAL DATA:  64 year old male with a history of left nephrolithiasis status post percutaneous nephrolithotomy on 05/03/2014. The patient had persistent ureteral obstruction secondary to residual stone fragments and potentially post- operative edema so a nephro ureteral stent was placed on 05/16/2014.  The patient has been doing well at home. He presents for antegrade nephrostogram and removal of the nephro ureteral stent versus internalization to a double-J ureteral stent.  EXAM: IR URETERAL STENT PLACEMENT EXISTING ACCESS LEFT  Date: 06/13/2014  PROCEDURE: 1. Antegrade nephroureterogram of through existing left percutaneous access 2. Placement of an 8 French 22 cm double-J ureteral stent Interventional Radiologist:  Criselda Peaches, MD  ANESTHESIA/SEDATION: Moderate (conscious) sedation was used. 2 mg Versed, 50 mcg Fentanyl were administered intravenously. The patient's vital signs were monitored continuously by radiology nursing throughout the procedure.  Sedation Time: 14 minutes  MEDICATIONS: 2g Ancef administered intravenously  FLUOROSCOPY TIME:  2 min 6 seconds  40 mGy  CONTRAST:  41m OMNIPAQUE IOHEXOL 300 MG/ML  SOLN  TECHNIQUE: Informed consent was obtained from the patient following explanation of the procedure, risks, benefits and alternatives. The patient understands, agrees and consents for the procedure. All questions were addressed. A time out was performed.  Maximal barrier sterile technique utilized including caps, mask, sterile gowns, sterile gloves, large sterile drape, hand hygiene, and Betadine skin prep.  The existing nephro ureteral stent was cut and removed over a Bentson wire. An 8 [place] sheath was advanced over the wire and into the renal pelvis. A through the sheath antegrade nephro ureteral g was performed. There is  continued obstruction at the UPJ. Injection of contrast material produces hydronephrosis. After a several minute delay, there is no significant drainage into the ureter or bladder.  Therefore, the Bentson wire was used to measure the ureteral length and a 22 cm 8 French double-J ureteral stent was selected. The stent was advanced over the wire and deployed. The inferior loop is appropriately reconstituted within the bladder and the proximal loop appropriately reconstituted within the renal pelvis. Second hand injection of contrast material into the renal pelvis was performed. There is rapid drainage of urine through the tube and into the bladder. The percutaneous access was then removed and a sterile bandage applied.  COMPLICATIONS: None  IMPRESSION: 1. Antegrade nephrostogram demonstrates persistent complete obstruction at the level of the UPJ. 2. Successful placement of an 8 French 22 cm double-J ureteral stent. Following stent placement there is rapid drainage of contrast material from the renal pelvis into the bladder. 3. The percutaneous nephrostomy access was removed.  PLAN: Patient will followup with Dr. WJeffie Pollockfor management of the internal double-J ureteral stent.  Signed,  HCriselda Peaches MD  [facility]  [facility]   Electronically Signed   By: Jacqulynn Cadet M.D.   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 Return in 2 weeks for follow-up.  Prechemo labs as planned.       B12 deficiency Due for next B12 injection in 2 weeks   Secondary malignancy of right lung Right apex of lung nodule (7.5 mm) and left upper lobe lesion (9 mm), concerning for malignancy treated with SBRT by Dr. [name] on 2/23, 2/25, 3/3, and 08/02/2013.        THERAPY PLAN:  Continue therapy  as planned.  Follow-up with urology as directed.  All questions were answered. The patient knows to call the clinic with any problems, questions or concerns. We can certainly see the patient much sooner if necessary.  Patient and plan discussed with Dr. Ancil Linsey and she is in agreement with the aforementioned.   Tegan Britain 06/23/2014

## 2014-06-21 NOTE — Assessment & Plan Note (Signed)
Right apex of lung nodule (7.5 mm) and left upper lobe lesion (9 mm), concerning for malignancy treated with SBRT by Dr. [name] on 2/23, 2/25, 3/3, and 08/02/2013.

## 2014-06-23 ENCOUNTER — Encounter (HOSPITAL_BASED_OUTPATIENT_CLINIC_OR_DEPARTMENT_OTHER): Payer: Managed Care, Other (non HMO)

## 2014-06-23 ENCOUNTER — Encounter (HOSPITAL_COMMUNITY): Payer: Self-pay | Admitting: Oncology

## 2014-06-23 ENCOUNTER — Encounter (HOSPITAL_COMMUNITY): Payer: Managed Care, Other (non HMO) | Attending: Oncology | Admitting: Oncology

## 2014-06-23 VITALS — BP 105/76 | HR 68 | Temp 97.9°F | Resp 18 | Wt 199.6 lb

## 2014-06-23 DIAGNOSIS — C7801 Secondary malignant neoplasm of right lung: Secondary | ICD-10-CM

## 2014-06-23 DIAGNOSIS — C20 Malignant neoplasm of rectum: Secondary | ICD-10-CM | POA: Diagnosis not present

## 2014-06-23 DIAGNOSIS — E538 Deficiency of other specified B group vitamins: Secondary | ICD-10-CM

## 2014-06-23 DIAGNOSIS — Z5112 Encounter for antineoplastic immunotherapy: Secondary | ICD-10-CM

## 2014-06-23 LAB — CBC WITH DIFFERENTIAL/PLATELET
BASOS PCT: 1 % (ref 0–1)
Basophils Absolute: 0 10*3/uL (ref 0.0–0.1)
EOS ABS: 0.1 10*3/uL (ref 0.0–0.7)
Eosinophils Relative: 3 % (ref 0–5)
HEMATOCRIT: 28.7 % — AB (ref 39.0–52.0)
HEMOGLOBIN: 9.3 g/dL — AB (ref 13.0–17.0)
LYMPHS ABS: 0.5 10*3/uL — AB (ref 0.7–4.0)
LYMPHS PCT: 12 % (ref 12–46)
MCH: 32.2 pg (ref 26.0–34.0)
MCHC: 32.4 g/dL (ref 30.0–36.0)
MCV: 99.3 fL (ref 78.0–100.0)
Monocytes Absolute: 0.3 10*3/uL (ref 0.1–1.0)
Monocytes Relative: 9 % (ref 3–12)
NEUTROS PCT: 76 % (ref 43–77)
Neutro Abs: 2.9 10*3/uL (ref 1.7–7.7)
PLATELETS: 59 10*3/uL — AB (ref 150–400)
RBC: 2.89 MIL/uL — AB (ref 4.22–5.81)
RDW: 18.2 % — ABNORMAL HIGH (ref 11.5–15.5)
WBC: 3.8 10*3/uL — ABNORMAL LOW (ref 4.0–10.5)

## 2014-06-23 MED ORDER — DIPHENHYDRAMINE HCL 50 MG/ML IJ SOLN
50.0000 mg | Freq: Once | INTRAMUSCULAR | Status: AC
  Administered 2014-06-23: 50 mg via INTRAVENOUS
  Filled 2014-06-23: qty 1

## 2014-06-23 MED ORDER — SODIUM CHLORIDE 0.9 % IJ SOLN
10.0000 mL | INTRAMUSCULAR | Status: DC | PRN
  Administered 2014-06-23: 10 mL
  Filled 2014-06-23: qty 10

## 2014-06-23 MED ORDER — SODIUM CHLORIDE 0.9 % IV SOLN
Freq: Once | INTRAVENOUS | Status: AC
  Administered 2014-06-23: 10:00:00 via INTRAVENOUS

## 2014-06-23 MED ORDER — HEPARIN SOD (PORK) LOCK FLUSH 100 UNIT/ML IV SOLN
500.0000 [IU] | Freq: Once | INTRAVENOUS | Status: AC | PRN
  Administered 2014-06-23: 500 [IU]
  Filled 2014-06-23: qty 5

## 2014-06-23 MED ORDER — CETUXIMAB CHEMO IV INJECTION 200 MG/100ML
200.0000 mg/m2 | Freq: Once | INTRAVENOUS | Status: AC
  Administered 2014-06-23: 400 mg via INTRAVENOUS
  Filled 2014-06-23: qty 200

## 2014-06-23 NOTE — Patient Instructions (Signed)
[place] at [facility]  Discharge Instructions:  Treatment today as planned.  Please call with any questions or concerns.   _______________________________________________________________  Thank you for choosing [place] at [facility] to provide your oncology and hematology care.  To afford each patient quality time with our providers, please arrive at least 15 minutes before your scheduled appointment.  You need to re-schedule your appointment if you arrive 10 or more minutes late.  We strive to give you quality time with our providers, and arriving late affects you and other patients whose appointments are after yours.  Also, if you no show three or more times for appointments you may be dismissed from the clinic.  Again, thank you for choosing [place] at [facility] [facility] is that these requests will allow you access to exceptional care and in a timely manner. _______________________________________________________________  If you have questions after your visit, please contact our office at [phone] between the hours of 8:30 a.m. and 5:00 p.m. Voicemails left after 4:30 p.m. will not be returned until the following business day. _______________________________________________________________  For prescription refill requests, have your pharmacy contact our office. _______________________________________________________________  Recommendations made by the consultant and any test results will be sent to your referring physician. _______________________________________________________________

## 2014-06-23 NOTE — Progress Notes (Signed)
Tolerated chemo well. 

## 2014-06-23 NOTE — Patient Instructions (Signed)
[facility] Discharge Instructions for Patients Receiving Chemotherapy  Today you received the following chemotherapy agents Erbitux.  To help prevent nausea and vomiting after your treatment, we encourage you to take your nausea medication as instructed. If you develop nausea and vomiting that is not controlled by your nausea medication, call the clinic. If it is after clinic hours your family physician or the after hours number for the clinic or go to the Emergency Department. BELOW ARE SYMPTOMS THAT SHOULD BE REPORTED IMMEDIATELY:  *FEVER GREATER THAN 101.0 F  *CHILLS WITH OR WITHOUT FEVER  NAUSEA AND VOMITING THAT IS NOT CONTROLLED WITH YOUR NAUSEA MEDICATION  *UNUSUAL SHORTNESS OF BREATH  *UNUSUAL BRUISING OR BLEEDING  TENDERNESS IN MOUTH AND THROAT WITH OR WITHOUT PRESENCE OF ULCERS  *URINARY PROBLEMS  *BOWEL PROBLEMS  UNUSUAL RASH Items with * indicate a potential emergency and should be followed up as soon as possible.  Return as scheduled.  I have been informed and understand all the instructions given to me. I know to contact the clinic, my physician, or go to the Emergency Department if any problems should occur. I do not have any questions at this time, but understand that I may call the clinic during office hours or the Patient Navigator at [phone] should I have any questions or need assistance in obtaining follow up care.    __________________________________________  _____________  __________ Signature of Patient or Authorized Representative            Date                   Time    __________________________________________ Nurse's Signature

## 2014-06-30 ENCOUNTER — Encounter (HOSPITAL_COMMUNITY): Payer: Managed Care, Other (non HMO) | Attending: Oncology

## 2014-06-30 DIAGNOSIS — C20 Malignant neoplasm of rectum: Secondary | ICD-10-CM | POA: Diagnosis not present

## 2014-06-30 DIAGNOSIS — C7801 Secondary malignant neoplasm of right lung: Secondary | ICD-10-CM | POA: Diagnosis not present

## 2014-06-30 DIAGNOSIS — E538 Deficiency of other specified B group vitamins: Secondary | ICD-10-CM | POA: Insufficient documentation

## 2014-06-30 DIAGNOSIS — Z5112 Encounter for antineoplastic immunotherapy: Secondary | ICD-10-CM

## 2014-06-30 DIAGNOSIS — D619 Aplastic anemia, unspecified: Secondary | ICD-10-CM | POA: Insufficient documentation

## 2014-06-30 DIAGNOSIS — S42009A Fracture of unspecified part of unspecified clavicle, initial encounter for closed fracture: Secondary | ICD-10-CM

## 2014-06-30 HISTORY — DX: Fracture of unspecified part of unspecified clavicle, initial encounter for closed fracture: S42.009A

## 2014-06-30 MED ORDER — DIPHENHYDRAMINE HCL 50 MG/ML IJ SOLN
50.0000 mg | Freq: Once | INTRAMUSCULAR | Status: AC
  Administered 2014-06-30: 50 mg via INTRAVENOUS
  Filled 2014-06-30: qty 1

## 2014-06-30 MED ORDER — HEPARIN SOD (PORK) LOCK FLUSH 100 UNIT/ML IV SOLN
500.0000 [IU] | Freq: Once | INTRAVENOUS | Status: AC | PRN
  Administered 2014-06-30: 500 [IU]
  Filled 2014-06-30: qty 5

## 2014-06-30 MED ORDER — SODIUM CHLORIDE 0.9 % IV SOLN
Freq: Once | INTRAVENOUS | Status: AC
  Administered 2014-06-30: 11:00:00 via INTRAVENOUS

## 2014-06-30 MED ORDER — CETUXIMAB CHEMO IV INJECTION 200 MG/100ML
200.0000 mg/m2 | Freq: Once | INTRAVENOUS | Status: AC
  Administered 2014-06-30: 400 mg via INTRAVENOUS
  Filled 2014-06-30: qty 200

## 2014-06-30 MED ORDER — SODIUM CHLORIDE 0.9 % IJ SOLN
10.0000 mL | INTRAMUSCULAR | Status: DC | PRN
  Administered 2014-06-30: 10 mL
  Filled 2014-06-30: qty 10

## 2014-06-30 NOTE — Patient Instructions (Signed)
[facility] Discharge Instructions for Patients Receiving Chemotherapy  Today you received the following chemotherapy agents weekly Erbitux. To help prevent nausea and vomiting after your treatment, we encourage you to take your nausea medication as instructed. If you develop nausea and vomiting that is not controlled by your nausea medication, call the clinic. If it is after clinic hours your family physician or the after hours number for the clinic or go to the Emergency Department.  BELOW ARE SYMPTOMS THAT SHOULD BE REPORTED IMMEDIATELY:  *FEVER GREATER THAN 101.0 F  *CHILLS WITH OR WITHOUT FEVER  NAUSEA AND VOMITING THAT IS NOT CONTROLLED WITH YOUR NAUSEA MEDICATION  *UNUSUAL SHORTNESS OF BREATH  *UNUSUAL BRUISING OR BLEEDING  TENDERNESS IN MOUTH AND THROAT WITH OR WITHOUT PRESENCE OF ULCERS  *URINARY PROBLEMS  *BOWEL PROBLEMS  UNUSUAL RASH Items with * indicate a potential emergency and should be followed up as soon as possible. Return as scheduled. I have been informed and understand all the instructions given to me. I know to contact the clinic, my physician, or go to the Emergency Department if any problems should occur. I do not have any questions at this time, but understand that I may call the clinic during office hours or the Patient Navigator at [phone] should I have any questions or need assistance in obtaining follow up care.    __________________________________________  _____________  __________ Signature of Patient or Authorized Representative            Date                   Time    __________________________________________ Nurse's Signature

## 2014-06-30 NOTE — Progress Notes (Signed)
Tolerated chemo well. 

## 2014-07-02 DIAGNOSIS — Z978 Presence of other specified devices: Secondary | ICD-10-CM

## 2014-07-02 HISTORY — DX: Presence of other specified devices: Z97.8

## 2014-07-07 ENCOUNTER — Encounter (HOSPITAL_BASED_OUTPATIENT_CLINIC_OR_DEPARTMENT_OTHER): Payer: Managed Care, Other (non HMO)

## 2014-07-07 ENCOUNTER — Encounter (HOSPITAL_BASED_OUTPATIENT_CLINIC_OR_DEPARTMENT_OTHER): Payer: Managed Care, Other (non HMO) | Admitting: Oncology

## 2014-07-07 ENCOUNTER — Encounter (HOSPITAL_COMMUNITY): Payer: Self-pay | Admitting: Oncology

## 2014-07-07 VITALS — BP 107/58 | HR 66 | Temp 98.5°F | Resp 16 | Wt 203.7 lb

## 2014-07-07 DIAGNOSIS — E538 Deficiency of other specified B group vitamins: Secondary | ICD-10-CM

## 2014-07-07 DIAGNOSIS — D61818 Other pancytopenia: Secondary | ICD-10-CM | POA: Diagnosis not present

## 2014-07-07 DIAGNOSIS — C7801 Secondary malignant neoplasm of right lung: Secondary | ICD-10-CM | POA: Diagnosis present

## 2014-07-07 DIAGNOSIS — C20 Malignant neoplasm of rectum: Secondary | ICD-10-CM

## 2014-07-07 DIAGNOSIS — D619 Aplastic anemia, unspecified: Secondary | ICD-10-CM | POA: Diagnosis present

## 2014-07-07 DIAGNOSIS — Z5112 Encounter for antineoplastic immunotherapy: Secondary | ICD-10-CM | POA: Diagnosis not present

## 2014-07-07 DIAGNOSIS — E539 Vitamin B deficiency, unspecified: Secondary | ICD-10-CM

## 2014-07-07 LAB — COMPREHENSIVE METABOLIC PANEL
ALBUMIN: 2.7 g/dL — AB (ref 3.5–5.2)
ALT: 22 U/L (ref 0–53)
AST: 32 U/L (ref 0–37)
Alkaline Phosphatase: 85 U/L (ref 39–117)
BUN: 14 mg/dL (ref 6–23)
CO2: 19 mmol/L (ref 19–32)
Calcium: 8.5 mg/dL (ref 8.4–10.5)
Chloride: 120 mmol/L — ABNORMAL HIGH (ref 96–112)
Creatinine, Ser: 1.4 mg/dL — ABNORMAL HIGH (ref 0.50–1.35)
GFR calc Af Amer: 61 mL/min — ABNORMAL LOW (ref >90)
GFR calc non Af Amer: 53 mL/min — ABNORMAL LOW (ref >90)
Glucose, Bld: 78 mg/dL (ref 70–99)
Potassium: 4.4 mmol/L (ref 3.5–5.1)
SODIUM: 141 mmol/L (ref 135–145)
TOTAL PROTEIN: 6.1 g/dL (ref 6.0–8.3)
Total Bilirubin: 0.8 mg/dL (ref 0.3–1.2)

## 2014-07-07 LAB — CBC WITH DIFFERENTIAL/PLATELET
Basophils Absolute: 0 10*3/uL (ref 0.0–0.1)
Basophils Relative: 0 % (ref 0–1)
EOS ABS: 0.2 10*3/uL (ref 0.0–0.7)
EOS PCT: 5 % (ref 0–5)
HCT: 28.1 % — ABNORMAL LOW (ref 39.0–52.0)
Hemoglobin: 9 g/dL — ABNORMAL LOW (ref 13.0–17.0)
LYMPHS PCT: 13 % (ref 12–46)
Lymphs Abs: 0.4 10*3/uL — ABNORMAL LOW (ref 0.7–4.0)
MCH: 32.5 pg (ref 26.0–34.0)
MCHC: 32 g/dL (ref 30.0–36.0)
MCV: 101.4 fL — AB (ref 78.0–100.0)
Monocytes Absolute: 0.4 10*3/uL (ref 0.1–1.0)
Monocytes Relative: 11 % (ref 3–12)
Neutro Abs: 2.4 10*3/uL (ref 1.7–7.7)
Neutrophils Relative %: 72 % (ref 43–77)
PLATELETS: 52 10*3/uL — AB (ref 150–400)
RBC: 2.77 MIL/uL — AB (ref 4.22–5.81)
RDW: 16.6 % — AB (ref 11.5–15.5)
WBC: 3.4 10*3/uL — ABNORMAL LOW (ref 4.0–10.5)

## 2014-07-07 MED ORDER — SODIUM CHLORIDE 0.9 % IV SOLN
Freq: Once | INTRAVENOUS | Status: AC
  Administered 2014-07-07: 11:00:00 via INTRAVENOUS

## 2014-07-07 MED ORDER — DIPHENHYDRAMINE HCL 50 MG/ML IJ SOLN
50.0000 mg | Freq: Once | INTRAMUSCULAR | Status: AC
Start: 2014-07-07 — End: 2014-07-07
  Administered 2014-07-07: 50 mg via INTRAVENOUS

## 2014-07-07 MED ORDER — SODIUM CHLORIDE 0.9 % IJ SOLN
10.0000 mL | INTRAMUSCULAR | Status: DC | PRN
  Administered 2014-07-07: 10 mL
  Filled 2014-07-07: qty 10

## 2014-07-07 MED ORDER — CETUXIMAB CHEMO IV INJECTION 200 MG/100ML
200.0000 mg/m2 | Freq: Once | INTRAVENOUS | Status: AC
  Administered 2014-07-07: 400 mg via INTRAVENOUS
  Filled 2014-07-07: qty 200

## 2014-07-07 MED ORDER — HEPARIN SOD (PORK) LOCK FLUSH 100 UNIT/ML IV SOLN
500.0000 [IU] | Freq: Once | INTRAVENOUS | Status: AC | PRN
  Administered 2014-07-07: 500 [IU]
  Filled 2014-07-07: qty 5

## 2014-07-07 MED ORDER — DIPHENHYDRAMINE HCL 50 MG/ML IJ SOLN
INTRAMUSCULAR | Status: AC
  Filled 2014-07-07: qty 1

## 2014-07-07 NOTE — Assessment & Plan Note (Signed)
Secondary to cirrhosis of liver and its complications.

## 2014-07-07 NOTE — Assessment & Plan Note (Signed)
On B12 injections every month.  Next injection due in 1 week on 07/14/2014.

## 2014-07-07 NOTE — Progress Notes (Signed)
[name] Tolerated chemotherapy well today.  Discharged ambulatory

## 2014-07-07 NOTE — Patient Instructions (Signed)
[facility] Discharge Instructions for Patients Receiving Chemotherapy  Today you received the following chemotherapy agents erbitux Please follow up as scheduled and call the clinic if you have any questions or concerns  To help prevent nausea and vomiting after your treatment, we encourage you to take your nausea medication    If you develop nausea and vomiting that is not controlled by your nausea medication, call the clinic. If it is after clinic hours your family physician or the after hours number for the clinic or go to the Emergency Department.   BELOW ARE SYMPTOMS THAT SHOULD BE REPORTED IMMEDIATELY:  *FEVER GREATER THAN 101.0 F  *CHILLS WITH OR WITHOUT FEVER  NAUSEA AND VOMITING THAT IS NOT CONTROLLED WITH YOUR NAUSEA MEDICATION  *UNUSUAL SHORTNESS OF BREATH  *UNUSUAL BRUISING OR BLEEDING  TENDERNESS IN MOUTH AND THROAT WITH OR WITHOUT PRESENCE OF ULCERS  *URINARY PROBLEMS  *BOWEL PROBLEMS  UNUSUAL RASH Items with * indicate a potential emergency and should be followed up as soon as possible.  One of the nurses will contact you 24 hours after your treatment. Please let the nurse know about any problems that you may have experienced. Feel free to call the clinic you have any questions or concerns. The clinic phone number is [phone].   I have been informed and understand all the instructions given to me. I know to contact the clinic, my physician, or go to the Emergency Department if any problems should occur. I do not have any questions at this time, but understand that I may call the clinic during office hours or the Patient Navigator at [phone] should I have any questions or need assistance in obtaining follow up care.   Cetuximab injection What is this medicine? CETUXIMAB (se TUX i mab) is a chemotherapy drug. It targets a specific protein within cancer cells and stops the cells from growing. It is used to treat colorectal cancer and head  and neck cancer. This medicine may be used for other purposes; ask your health care provider or pharmacist if you have questions. COMMON BRAND NAME(S): Erbitux What should I tell my health care provider before I take this medicine? They need to know if you have any of these conditions: -heart disease -history of irregular heartbeat -history of low levels of calcium, magnesium, or potassium in the blood -lung or breathing disease, like asthma -an unusual or allergic reaction to cetuximab, other medicines, foods, dyes, or preservatives -pregnant or trying to get pregnant -breast-feeding How should I use this medicine? This drug is given as an infusion into a vein. It is administered in a hospital or clinic by a specially trained health care professional. Talk to your pediatrician regarding the use of this medicine in children. Special care may be needed. Overdosage: If you think you have taken too much of this medicine contact a poison control center or emergency room at once. NOTE: This medicine is only for you. Do not share this medicine with others. What if I miss a dose? It is important not to miss your dose. Call your doctor or health care professional if you are unable to keep an appointment. What may interact with this medicine? Interactions are not expected. This list may not describe all possible interactions. Give your health care provider a list of all the medicines, herbs, non-prescription drugs, or dietary supplements you use. Also tell them if you smoke, drink alcohol, or use illegal drugs. Some items may interact with your medicine. What  should I watch for while using this medicine? Visit your doctor or health care professional for regular checks on your progress. This drug may make you feel generally unwell. This is not uncommon, as chemotherapy can affect healthy cells as well as cancer cells. Report any side effects. Continue your course of treatment even though you feel ill  unless your doctor tells you to stop. This medicine can make you more sensitive to the sun. Keep out of the sun while taking this medicine and for 2 months after the last dose. If you cannot avoid being in the sun, wear protective clothing and use sunscreen. Do not use sun lamps or tanning beds/booths. You may need blood work done while you are taking this medicine. In some cases, you may be given additional medicines to help with side effects. Follow all directions for their use. Call your doctor or health care professional for advice if you get a fever, chills or sore throat, or other symptoms of a cold or flu. Do not treat yourself. This drug decreases your body's ability to fight infections. Try to avoid being around people who are sick. Avoid taking products that contain aspirin, acetaminophen, ibuprofen, naproxen, or ketoprofen unless instructed by your doctor. These medicines may hide a fever. Do not become pregnant while taking this medicine. Women should inform their doctor if they wish to become pregnant or think they might be pregnant. There is a potential for serious side effects to an unborn child. Use adequate birth control methods. Avoid pregnancy for at least 6 months after your last dose. Talk to your health care professional or pharmacist for more information. Do not breast-feed an infant while taking this medicine or during the 2 months after your last dose. What side effects may I notice from receiving this medicine? Side effects that you should report to your doctor or health care professional as soon as possible: -allergic reactions like skin rash, itching or hives, swelling of the face, lips, or tongue -breathing problems -changes in vision -fast, irregular heartbeat -feeling faint or lightheaded, falls -fever, chills -mouth sores -redness, blistering, peeling or loosening of the skin, including inside the mouth -trouble passing urine or change in the amount of  urine -unusually weak or tired Side effects that usually do not require medical attention (report to your doctor or health care professional if they continue or are bothersome): -changes in skin like acne, cracks, skin dryness -constipation -diarrhea -headache -nail changes -nausea, vomiting -stomach upset -weight loss This list may not describe all possible side effects. Call your doctor for medical advice about side effects. You may report side effects to FDA at 1-800-FDA-1088. Where should I keep my medicine? This drug is given in a hospital or clinic and will not be stored at home. NOTE: This sheet is a summary. It may not cover all possible information. If you have questions about this medicine, talk to your doctor, pharmacist, or health care provider.  2015, [employer]vier/Gold Standard. (2013-08-28 16:14:34)

## 2014-07-07 NOTE — Assessment & Plan Note (Signed)
Right apex of lung nodule (7.5 mm) and left upper lobe lesion (9 mm), concerning for malignancy treated with SBRT by Dr. [name] on 2/23, 2/25, 3/3, and 08/02/2013.

## 2014-07-07 NOTE — Assessment & Plan Note (Addendum)
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 Unfortunately, he was admitted to the hospital for an extended stay secondary to AKI resulting in holding of therapy.  Restarted on 06/09/2014 with loading dose.  CEA was noted to increase during this time off of therapy.   Treatment today as planned.  Recommended supportive care of his skin fissures of fingertips which is likely chemotherapy-induced.  Recommended Neosporin (or something similar) and moisturizing lotion.  If progresses, will hold chemotherapy for a short time to allow resolution.  Return in 2 weeks for follow-up as planned.  Prechemo labs as ordered.

## 2014-07-07 NOTE — Progress Notes (Signed)
[name], MD [place] [address]  Adenocarcinoma of rectum  B12 deficiency  Pancytopenia, acquired  Secondary malignancy of right lung  CURRENT THERAPY: Erbitux single-agent beginning on (03/17/14)   INTERVAL HISTORY: [name] 64 y.o. 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     Adenocarcinoma of rectum   07/07/2010 Initial Diagnosis Adenocarcinoma of rectum   02/24/2014 Progression CT CAP-  Findings within the chest are worrisome for recurrent tumor with bilateral pulmonary metastasis.   03/17/2014 - 04/21/2014 Chemotherapy Erbitux single agent   04/28/2014 - 05/18/2014 Hospital Admission Hematuria,  AKI (acute kidney injury),  Obstructive uropathy,  Anemia,  Metabolic acidosis. S/p left PCN. Removed 12/20 by GU. Received 20 units of PRBCs and 11 units of platelets during this admission   06/09/2014 -  Chemotherapy Re-load with single-agent Erbitux   07/07/2014 Adverse Reaction Mild skin fissures of hands.  Secondary to Cetuximab.  Will treat supportively.    I personally reviewed and went over laboratory results with the patient.  The results are noted within this dictation.  He has developed some skin fissures of the finger, particularly the thumb.  He notes that it is mild.  We will continue with therapy with supportive care for this side effect and hold therapy if it progresses.  He denies any plantar issues.  He reports "I had bladder testing and they said my bladder isn't working.  What are they going to do about that?"  I will defer this to his urologist.  Oncologically, he denies any complaints and ROS questioning is negative.  He is to continue follow-up with Urology as directed.   Past Medical History  Diagnosis Date  . Pancytopenia, acquired 12/04/2010  . B12 deficiency 12/07/2010  . Kidney stones   . CAD (coronary artery disease)   . Anemia   . History of  radiation therapy 06/19/07 -07/27/07    whole pelvis  . Hx of radiation therapy 08/06/12,08/09/12,& 08/13/12    rul 56GY/39f  . Cancer     Colon ca dx 2008 surg/rad/chemo  . Lung cancer   . FH: chemotherapy   . Myocardial infarction   . Rectal bleeding 09/20/2013    From recurrent rectal mass/recurrent rectal adenocarcinoma  . Acute blood loss anemia 09/27/2013  . 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  2.7 cm primary, moderately differentiated with 3/8 positive lymph nodes without LVI.  Surgery was on 04/22/2007 and was treated postoperatively with radiation and concomitant capecitabine and then 4 cycles of a planned 6 with oxaliplatin and capecitabine.  His counts would not allow treatment of  . Hydronephrosis of left kidney 09/24/2013    Kidney stones; s/p cystoscopy and stent  . Gross hematuria 09/25/2103  . Lung metastases 07/09/2013  . Secondary malignancy of right lung 07/09/2013  . Insomnia 06/09/2014  . Foley catheter in place 07/02/14    has DIABETES MELLITUS, TYPE II; HYPERCHOLESTEROLEMIA; Deficiency anemia; CORONARY ARTERY DISEASE; SLEEP APNEA; Adenocarcinoma of rectum; Pancytopenia, acquired; B12 deficiency; Port catheter in place; Hx of radiation therapy; Thrombocytopenia; Secondary malignancy of right lung; Rectal bleeding; ARF (acute renal failure); Hydronephrosis; Hydronephrosis of left kidney; GI bleeding; Acute blood loss anemia; Urinary retention; Gross hematuria; AKI (acute kidney injury); Anemia; Hyperkalemia; Nephrolithiasis; and Insomnia on his problem list.     is allergic  to daypro and xeloda.  Mr. Mendiola does not currently have medications on file.  Past Surgical History  Procedure Laterality Date  . Ileostomy  12/07/2010    Procedure: ILEOSTOMY;  Surgeon: Donato Heinz;  Location: [facility];  Service: General;  Laterality: N/A;  Diverting Ileostomy Lysis of adhesions  Exploratory Laparotomy  . Lung biopsy  10/27/10    rt lobe- adenocarcinoma  . Cholecystectomy    . Cataract extraction w/ intraocular lens implant  2007    bil  . Lithotripsy  2002  . Coronary angioplasty with stent placement  2003    stenting x 2  . Colon resection  2008    x2  . Esophagogastroduodenoscopy N/A 05/02/2013    Dr. Gala Romney- polypoid antral fold, bx= reactive gastropathy  . Givens capsule study N/A 05/02/2013    Procedure: GIVENS CAPSULE STUDY;  Surgeon: Daneil Dolin, MD;  Location: [facility];  Service: Endoscopy;  Laterality: N/A;  . Colonoscopy N/A 09/20/2013    SLF:A near circumferential fungating mass with friable surfaces was found in the rectum.  ACTIVELY OOZING.  CLOTS SEEN IN LUMEN AND ASPIRATED.  Multiple biopsies were performed using cold forceps.  Thermal therapy(BICAP 7 Fr 25W) was used to ATTEMPT TO control bleeding.    HEMOSTASIS NOT ACHEIVED.  Marland Kitchen Cystoscopy w/ ureteral stent placement Left 09/24/2013    Procedure: CYSTOSCOPY WITH LEFT RETROGRADE PYELOGRAM; LEFT URETERAL STENT PLACEMENT; REMOVAL OF SMALL BLADDER CALCULI;  Surgeon: Marissa Nestle, MD;  Location: [facility];  Service: Urology;  Laterality: Left;  . Flexible sigmoidoscopy N/A 09/25/2013    VPX:[id number] exophytic rectal mass most likely representing recurrent adenocarcinoma status post biopsy  . Cystoscopy with retrograde pyelogram, ureteroscopy and stent placement Left 05/08/2014    Procedure: CYSTOSCOPY/ EVACUATION OF CLOTS  WITH LEFT RETROGRADE PYELOGRAM,LEFT URETEROSCOPY ;  Surgeon: Malka So, MD;  Location: [facility];  Service: Urology;  Laterality: Left;  . Nephrolithotomy Left 05/13/2014    Procedure: LEFT NEPHROLITHOTOMY PERCUTANEOUS FIRST STAGE;  Surgeon: Malka So, MD;  Location: [facility];  Service: Urology;  Laterality: Left;  . Holmium laser application Left 46/27/0350    Procedure: HOLMIUM LASER APPLICATION;  Surgeon: Malka So, MD;  Location: [facility];  Service: Urology;  Laterality:  Left;    Denies any headaches, dizziness, double vision, fevers, chills, night sweats, nausea, vomiting, diarrhea, constipation, chest pain, heart palpitations, shortness of breath, blood in stool, black tarry stool, urinary pain, urinary burning, urinary frequency, hematuria.   PHYSICAL EXAMINATION  ECOG PERFORMANCE STATUS: 1 - Symptomatic but completely ambulatory  Filed Vitals:   07/07/14 0951  BP: 107/58  Pulse: 66  Temp: 98.5 F (36.9 C)  Resp: 16    GENERAL:alert, no distress, well nourished, well developed, comfortable, cooperative and smiling SKIN: skin fissures of fingertips. HEAD: Normocephalic, No masses, lesions, tenderness or abnormalities EYES: normal, PERRLA, EOMI, Conjunctiva are pink and non-injected EARS: External ears normal OROPHARYNX:lips, buccal mucosa, and tongue normal and mucous membranes are moist  NECK: supple, no adenopathy, thyroid normal size, non-tender, without nodularity, no stridor, non-tender, trachea midline LYMPH:  no palpable lymphadenopathy BREAST:not examined LUNGS: clear to auscultation and percussion HEART: regular rate & rhythm, no murmurs, no gallops, S1 normal and S2 normal ABDOMEN:abdomen soft, non-tender, normal bowel sounds and no masses or organomegaly BACK: Back symmetric, no curvature. EXTREMITIES:less then 2 second capillary refill, no joint deformities, effusion, or inflammation, no skin discoloration, no cyanosis.  See skin exam above. NEURO: alert & oriented x  3 with fluent speech, no focal motor/sensory deficits, gait normal   LABORATORY DATA: CBC    Component Value Date/Time   WBC 3.4* 07/07/2014 0939   RBC 2.77* 07/07/2014 0939   HGB 9.0* 07/07/2014 0939   HCT 28.1* 07/07/2014 0939   PLT 52* 07/07/2014 0939   MCV 101.4* 07/07/2014 0939   MCH 32.5 07/07/2014 0939   MCHC 32.0 07/07/2014 0939   RDW 16.6* 07/07/2014 0939   LYMPHSABS 0.4* 07/07/2014 0939   MONOABS 0.4 07/07/2014 0939   EOSABS 0.2 07/07/2014 0939    BASOSABS 0.0 07/07/2014 0939      Chemistry      Component Value Date/Time   NA 141 07/07/2014 0939   K 4.4 07/07/2014 0939   CL 120* 07/07/2014 0939   CO2 19 07/07/2014 0939   BUN 14 07/07/2014 0939   CREATININE 1.40* 07/07/2014 0939      Component Value Date/Time   CALCIUM 8.5 07/07/2014 0939   ALKPHOS 85 07/07/2014 0939   AST 32 07/07/2014 0939   ALT 22 07/07/2014 0939   BILITOT 0.8 07/07/2014 0939       RADIOGRAPHIC STUDIES:  Ir Ureteral Stent Placement Existing Access Left  06/13/2014   CLINICAL DATA:  64 year old male with a history of left nephrolithiasis status post percutaneous nephrolithotomy on 05/03/2014. The patient had persistent ureteral obstruction secondary to residual stone fragments and potentially post- operative edema so a nephro ureteral stent was placed on 05/16/2014.  The patient has been doing well at home. He presents for antegrade nephrostogram and removal of the nephro ureteral stent versus internalization to a double-J ureteral stent.  EXAM: IR URETERAL STENT PLACEMENT EXISTING ACCESS LEFT  Date: 06/13/2014  PROCEDURE: 1. Antegrade nephroureterogram of through existing left percutaneous access 2. Placement of an 8 French 22 cm double-J ureteral stent Interventional Radiologist:  Criselda Peaches, MD  ANESTHESIA/SEDATION: Moderate (conscious) sedation was used. 2 mg Versed, 50 mcg Fentanyl were administered intravenously. The patient's vital signs were monitored continuously by radiology nursing throughout the procedure.  Sedation Time: 14 minutes  MEDICATIONS: 2g Ancef administered intravenously  FLUOROSCOPY TIME:  2 min 6 seconds  40 mGy  CONTRAST:  36m OMNIPAQUE IOHEXOL 300 MG/ML  SOLN  TECHNIQUE: Informed consent was obtained from the patient following explanation of the procedure, risks, benefits and alternatives. The patient understands, agrees and consents for the procedure. All questions were addressed. A time out was performed.  Maximal barrier  sterile technique utilized including caps, mask, sterile gowns, sterile gloves, large sterile drape, hand hygiene, and Betadine skin prep.  The existing nephro ureteral stent was cut and removed over a Bentson wire. An 8 [place] sheath was advanced over the wire and into the renal pelvis. A through the sheath antegrade nephro ureteral g was performed. There is continued obstruction at the UPJ. Injection of contrast material produces hydronephrosis. After a several minute delay, there is no significant drainage into the ureter or bladder.  Therefore, the Bentson wire was used to measure the ureteral length and a 22 cm 8 French double-J ureteral stent was selected. The stent was advanced over the wire and deployed. The inferior loop is appropriately reconstituted within the bladder and the proximal loop appropriately reconstituted within the renal pelvis. Second hand injection of contrast material into the renal pelvis was performed. There is rapid drainage of urine through the tube and into the bladder. The percutaneous access was then removed and a sterile bandage applied.  COMPLICATIONS: None  IMPRESSION: 1. Antegrade  nephrostogram demonstrates persistent complete obstruction at the level of the UPJ. 2. Successful placement of an 8 French 22 cm double-J ureteral stent. Following stent placement there is rapid drainage of contrast material from the renal pelvis into the bladder. 3. The percutaneous nephrostomy access was removed.  PLAN: Patient will followup with Dr. Jeffie Pollock for management of the internal double-J ureteral stent.  Signed,  Criselda Peaches, MD  [facility]  [facility]   Electronically Signed   By: Jacqulynn Cadet M.D.   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  Unfortunately, he was admitted to the hospital for an extended stay secondary to AKI resulting in holding of therapy.  Restarted on 06/09/2014 with loading dose.  CEA was noted to increase during this time off of therapy.   Treatment today as planned.  Recommended supportive care of his skin fissures of fingertips which is likely chemotherapy-induced.  Recommended Neosporin (or something similar) and moisturizing lotion.  If progresses, will hold chemotherapy for a short time to allow resolution.  Return in 2 weeks for follow-up as planned.  Prechemo labs as ordered.   B12 deficiency On B12 injections every month.  Next injection due in 1 week on 07/14/2014.   Pancytopenia, acquired Secondary to cirrhosis of liver and its complications.   Secondary malignancy of right lung Right apex of lung nodule (7.5 mm) and left upper lobe lesion (9 mm), concerning for malignancy treated with SBRT by Dr. [name] on 2/23, 2/25, 3/3, and 08/02/2013.    THERAPY PLAN:  Continue therapy as planned. Follow-up with urology as directed.  All questions were answered. The patient knows to call the clinic with any problems, questions or concerns. We can certainly see the patient much sooner if necessary.  Patient and plan discussed with Dr. Ancil Linsey and she is in agreement with the aforementioned.   Rayma Hegg 07/07/2014

## 2014-07-08 LAB — CEA: CEA: 8.8 ng/mL — ABNORMAL HIGH (ref 0.0–4.7)

## 2014-07-09 ENCOUNTER — Encounter (HOSPITAL_COMMUNITY): Payer: Self-pay

## 2014-07-09 ENCOUNTER — Emergency Department (HOSPITAL_COMMUNITY): Payer: Managed Care, Other (non HMO)

## 2014-07-09 ENCOUNTER — Emergency Department (HOSPITAL_COMMUNITY)
Admission: EM | Admit: 2014-07-09 | Discharge: 2014-07-09 | Disposition: A | Discharge status: Home / Self Care | Payer: Managed Care, Other (non HMO) | Attending: Emergency Medicine | Admitting: Emergency Medicine

## 2014-07-09 DIAGNOSIS — S20212A Contusion of left front wall of thorax, initial encounter: Secondary | ICD-10-CM | POA: Insufficient documentation

## 2014-07-09 DIAGNOSIS — S51812A Laceration without foreign body of left forearm, initial encounter: Secondary | ICD-10-CM

## 2014-07-09 DIAGNOSIS — I252 Old myocardial infarction: Secondary | ICD-10-CM | POA: Diagnosis not present

## 2014-07-09 DIAGNOSIS — Z87891 Personal history of nicotine dependence: Secondary | ICD-10-CM | POA: Diagnosis not present

## 2014-07-09 DIAGNOSIS — S299XXA Unspecified injury of thorax, initial encounter: Secondary | ICD-10-CM | POA: Diagnosis present

## 2014-07-09 DIAGNOSIS — W01198A Fall on same level from slipping, tripping and stumbling with subsequent striking against other object, initial encounter: Secondary | ICD-10-CM | POA: Insufficient documentation

## 2014-07-09 DIAGNOSIS — Z7952 Long term (current) use of systemic steroids: Secondary | ICD-10-CM | POA: Insufficient documentation

## 2014-07-09 DIAGNOSIS — Y9289 Other specified places as the place of occurrence of the external cause: Secondary | ICD-10-CM | POA: Insufficient documentation

## 2014-07-09 DIAGNOSIS — S50812A Abrasion of left forearm, initial encounter: Secondary | ICD-10-CM | POA: Insufficient documentation

## 2014-07-09 DIAGNOSIS — Z85118 Personal history of other malignant neoplasm of bronchus and lung: Secondary | ICD-10-CM | POA: Insufficient documentation

## 2014-07-09 DIAGNOSIS — Y9389 Activity, other specified: Secondary | ICD-10-CM | POA: Diagnosis not present

## 2014-07-09 DIAGNOSIS — Z87442 Personal history of urinary calculi: Secondary | ICD-10-CM | POA: Diagnosis not present

## 2014-07-09 DIAGNOSIS — Z79899 Other long term (current) drug therapy: Secondary | ICD-10-CM | POA: Insufficient documentation

## 2014-07-09 DIAGNOSIS — Z85038 Personal history of other malignant neoplasm of large intestine: Secondary | ICD-10-CM | POA: Insufficient documentation

## 2014-07-09 DIAGNOSIS — Y998 Other external cause status: Secondary | ICD-10-CM | POA: Insufficient documentation

## 2014-07-09 DIAGNOSIS — Z792 Long term (current) use of antibiotics: Secondary | ICD-10-CM | POA: Insufficient documentation

## 2014-07-09 DIAGNOSIS — Z8719 Personal history of other diseases of the digestive system: Secondary | ICD-10-CM | POA: Insufficient documentation

## 2014-07-09 DIAGNOSIS — Z23 Encounter for immunization: Secondary | ICD-10-CM | POA: Insufficient documentation

## 2014-07-09 DIAGNOSIS — W19XXXA Unspecified fall, initial encounter: Secondary | ICD-10-CM

## 2014-07-09 DIAGNOSIS — S42002A Fracture of unspecified part of left clavicle, initial encounter for closed fracture: Secondary | ICD-10-CM

## 2014-07-09 DIAGNOSIS — D649 Anemia, unspecified: Secondary | ICD-10-CM | POA: Insufficient documentation

## 2014-07-09 DIAGNOSIS — Z9889 Other specified postprocedural states: Secondary | ICD-10-CM | POA: Insufficient documentation

## 2014-07-09 DIAGNOSIS — E538 Deficiency of other specified B group vitamins: Secondary | ICD-10-CM | POA: Insufficient documentation

## 2014-07-09 DIAGNOSIS — S42032A Displaced fracture of lateral end of left clavicle, initial encounter for closed fracture: Secondary | ICD-10-CM | POA: Diagnosis not present

## 2014-07-09 DIAGNOSIS — I2510 Atherosclerotic heart disease of native coronary artery without angina pectoris: Secondary | ICD-10-CM | POA: Insufficient documentation

## 2014-07-09 MED ORDER — MORPHINE SULFATE 4 MG/ML IJ SOLN
4.0000 mg | Freq: Once | INTRAMUSCULAR | Status: AC
  Administered 2014-07-09: 4 mg via INTRAVENOUS
  Filled 2014-07-09: qty 1

## 2014-07-09 MED ORDER — TETANUS-DIPHTH-ACELL PERTUSSIS 5-2.5-18.5 LF-MCG/0.5 IM SUSP
0.5000 mL | Freq: Once | INTRAMUSCULAR | Status: AC
  Administered 2014-07-09: 0.5 mL via INTRAMUSCULAR
  Filled 2014-07-09: qty 0.5

## 2014-07-09 NOTE — ED Notes (Signed)
Patient verbalizes understanding of discharge instructions, home care and follow up care. Patient out of department at this time with family member.

## 2014-07-09 NOTE — Discharge Instructions (Signed)
Clavicle Fracture °The clavicle, also called the collarbone, is the long bone that connects your shoulder to your rib cage. You can feel your collarbone at the top of your shoulders and rib cage. A clavicle fracture is a broken clavicle. It is a common injury that can happen at any age.  °CAUSES °Common causes of a clavicle fracture include: °· A direct blow to your shoulder. °· A car accident. °· A fall, especially if you try to break your fall with an outstretched arm. °RISK FACTORS °You may be at increased risk if: °· You are younger than 25 years or older than 75 years. Most clavicle fractures happen to people who are younger than 25 years. °· You are a male. °· You play contact sports. °SIGNS AND SYMPTOMS °A fractured clavicle is painful. It also makes it hard to move your arm. Other signs and symptoms may include: °· A shoulder that drops downward and forward. °· Pain when trying to lift your shoulder. °· Bruising, swelling, and tenderness over your clavicle. °· A grinding noise when you try to move your shoulder. °· A bump over your clavicle. °DIAGNOSIS °Your health care provider can usually diagnose a clavicle fracture by asking about your injury and examining your shoulder and clavicle. He or she may take an X-ray to determine the position of your clavicle. °TREATMENT °Treatment depends on the position of your clavicle after the fracture: °· If the broken ends of the bone are not out of place, your health care provider may put your arm in a sling or wrap a support bandage around your chest (figure-of-eight wrap). °· If the broken ends of the bone are out of place, you may need surgery. Surgery may involve placing screws, pins, or plates to keep your clavicle stable while it heals. Healing may take about 3 months. °When your health care provider thinks your fracture has healed enough, you may have to do physical therapy to regain normal movement and build up your arm strength. °HOME CARE INSTRUCTIONS   °· Apply ice to the injured area: °¨ Put ice in a plastic bag. °¨ Place a towel between your skin and the bag. °¨ Leave the ice on for 20 minutes, 2-3 times a day. °· If you have a wrap or splint: °¨ Wear it all the time, and remove it only to take a bath or shower. °¨ When you bathe or shower, keep your shoulder in the same position as when the sling or wrap is on. °¨ Do not lift your arm. °· If you have a figure-of-eight wrap: °¨ Another person must tighten it every day. °¨ It should be tight enough to hold your shoulders back. °¨ Allow enough room to place your index finger between your body and the strap. °¨ Loosen the wrap immediately if you feel numbness or tingling in your hands. °· Only take medicines as directed by your health care provider. °· Avoid activities that make the injury or pain worse for 4-6 weeks after surgery. °· Keep all follow-up appointments. °SEEK MEDICAL CARE IF:  °Your medicine is not helping to relieve pain and swelling. °SEEK IMMEDIATE MEDICAL CARE IF:  °Your arm is numb, cold, or pale, even when the splint is loose. °MAKE SURE YOU:  °· Understand these instructions. °· Will watch your condition. °· Will get help right away if you are not doing well or get worse. °Document Released: 02/23/2005 Document Revised: 05/21/2013 Document Reviewed: 04/08/2013 °[employer]® Patient Information ©2015 [employer]. This information is   not intended to replace advice given to you by your health care provider. Make sure you discuss any questions you have with your health care provider.  Chest Contusion A chest contusion is a deep bruise on your chest area. Contusions are the result of an injury that caused bleeding under the skin. A chest contusion may involve bruising of the skin, muscles, or ribs. The contusion may turn blue, purple, or yellow. Minor injuries will give you a painless contusion, but more severe contusions may stay painful and swollen for a few weeks. CAUSES  A contusion is  usually caused by a blow, trauma, or direct force to an area of the body. SYMPTOMS   Swelling and redness of the injured area.  Discoloration of the injured area.  Tenderness and soreness of the injured area.  Pain. DIAGNOSIS  The diagnosis can be made by taking a history and performing a physical exam. An X-ray, CT scan, or MRI may be needed to determine if there were any associated injuries, such as broken bones (fractures) or internal injuries. TREATMENT  Often, the best treatment for a chest contusion is resting, icing, and applying cold compresses to the injured area. Deep breathing exercises may be recommended to reduce the risk of pneumonia. Over-the-counter medicines may also be recommended for pain control. HOME CARE INSTRUCTIONS   Put ice on the injured area.  Put ice in a plastic bag.  Place a towel between your skin and the bag.  Leave the ice on for 15-20 minutes, 03-04 times a day.  Only take over-the-counter or prescription medicines as directed by your caregiver. Your caregiver may recommend avoiding anti-inflammatory medicines (aspirin, ibuprofen, and naproxen) for 48 hours because these medicines may increase bruising.  Rest the injured area.  Perform deep-breathing exercises as directed by your caregiver.  Stop smoking if you smoke.  Do not lift objects over 5 pounds (2.3 kg) for 3 days or longer if recommended by your caregiver. SEEK IMMEDIATE MEDICAL CARE IF:   You have increased bruising or swelling.  You have pain that is getting worse.  You have difficulty breathing.  You have dizziness, weakness, or fainting.  You have blood in your urine or stool.  You cough up or vomit blood.  Your swelling or pain is not relieved with medicines. MAKE SURE YOU:   Understand these instructions.  Will watch your condition.  Will get help right away if you are not doing well or get worse. Document Released: 02/08/2001 Document Revised: 02/08/2012 Document  Reviewed: 11/07/2011 [facility] Patient Information 2015 [place], [place]. This information is not intended to replace advice given to you by your health care provider. Make sure you discuss any questions you have with your health care provider.  Skin Tear Care A skin tear is a wound in which the top layer of skin has peeled off. This is a common problem with aging because the skin becomes thinner and more fragile as a person gets older. In addition, some medicines, such as oral corticosteroids, can lead to skin thinning if taken for long periods of time.  A skin tear is often repaired with tape or skin adhesive strips. This keeps the skin that has been peeled off in contact with the healthier skin beneath. Depending on the location of the wound, a bandage (dressing) may be applied over the tape or skin adhesive strips. Sometimes, during the healing process, the skin turns black and dies. Even when this happens, the torn skin acts as a good dressing  until the skin underneath gets healthier and repairs itself. HOME CARE INSTRUCTIONS   Change dressings once per day or as directed by your caregiver.  Gently clean the skin tear and the area around the tear using saline solution or mild soap and water.  Do not rub the injured skin dry. Let the area air dry.  Apply petroleum jelly or an antibiotic cream or ointment to keep the tear moist. This will help the wound heal. Do not allow a scab to form.  If the dressing sticks before the next dressing change, moisten it with warm soapy water and gently remove it.  Protect the injured skin until it has healed.  Only take over-the-counter or prescription medicines as directed by your caregiver.  Take showers or baths using warm soapy water. Apply a new dressing after the shower or bath.  Keep all follow-up appointments as directed by your caregiver.  SEEK IMMEDIATE MEDICAL CARE IF:   You have redness, swelling, or increasing pain in the skin tear.  You  havepus coming from the skin tear.  You have chills.  You have a red streak that goes away from the skin tear.  You have a bad smell coming from the tear or dressing.  You have a fever or persistent symptoms for more than 2-3 days.  You have a fever and your symptoms suddenly get worse. MAKE SURE YOU:  Understand these instructions.  Will watch this condition.  Will get help right away if your child is not doing well or gets worse. Document Released: 02/08/2001 Document Revised: 02/08/2012 Document Reviewed: 11/28/2011 [facility] Patient Information 2015 [place], [place]. This information is not intended to replace advice given to you by your health care provider. Make sure you discuss any questions you have with your health care provider.

## 2014-07-09 NOTE — ED Notes (Signed)
Patient states he tripped outside and fell on left side. Patient presents with skin abrasions to left arm and deformity noted to left shoulder. Patient was given 6mg  or morphine en route via [facility]. A&OX4. Patient denies pain to head or neck . Patient also has small abrasion noted to right shin.

## 2014-07-09 NOTE — ED Provider Notes (Signed)
CSN: [id number]     Arrival date & time 07/09/14  1947 History  This chart was scribed for [employer]. Alvino Chapel, MD by Molli Posey, ED Scribe. This patient was seen in room APA03/APA03 and the patient's care was started 8:07 PM.    Chief Complaint  Patient presents with  . Fall   The history is provided by the patient. No language interpreter was used.   HPI Comments: [name] is a 64 y.o. male with a history of MI and colon CA who presents to the Emergency Department complaining of a fall that occurred PTA. Pt states that he tripped and fell outside when he was trying to take out the trash, striking his left side. He complains of left sided chest pain and left shoulder pain at this time. He denies any head or neck trauma from the fall. Pt reports he has skin abrasions on left arm, hand and right shin. He says that deep breathing and certain movements worsens his pain. Pt states that he does not have cancer in his left shoulder to his knowledge. He is unsure if he is UTD on his tetanus. Pt reports no alleviating factors at this time.   Past Medical History  Diagnosis Date  . Pancytopenia, acquired 12/04/2010  . B12 deficiency 12/07/2010  . Kidney stones   . CAD (coronary artery disease)   . Anemia   . History of radiation therapy 06/19/07 -07/27/07    whole pelvis  . Hx of radiation therapy 08/06/12,08/09/12,& 08/13/12    rul 54GY/84fx  . Cancer     Colon ca dx 2008 surg/rad/chemo  . Lung cancer   . FH: chemotherapy   . Myocardial infarction   . Rectal bleeding 09/20/2013    From recurrent rectal mass/recurrent rectal adenocarcinoma  . Acute blood loss anemia 09/27/2013  . 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  2.7 cm primary, moderately differentiated with 3/8 positive lymph nodes without LVI.  Surgery was on 04/22/2007 and was treated postoperatively with radiation and concomitant  capecitabine and then 4 cycles of a planned 6 with oxaliplatin and capecitabine.  His counts would not allow treatment of  . Hydronephrosis of left kidney 09/24/2013    Kidney stones; s/p cystoscopy and stent  . Gross hematuria 09/25/2103  . Lung metastases 07/09/2013  . Secondary malignancy of right lung 07/09/2013  . Insomnia 06/09/2014  . Foley catheter in place 07/02/14   Past Surgical History  Procedure Laterality Date  . Ileostomy  12/07/2010    Procedure: ILEOSTOMY;  Surgeon: Donato Heinz;  Location: [facility];  Service: General;  Laterality: N/A;  Diverting Ileostomy Lysis of adhesions Exploratory Laparotomy  . Lung biopsy  10/27/10    rt lobe- adenocarcinoma  . Cholecystectomy    . Cataract extraction w/ intraocular lens implant  2007    bil  . Lithotripsy  2002  . Coronary angioplasty with stent placement  2003    stenting x 2  . Colon resection  2008    x2  . Esophagogastroduodenoscopy N/A 05/02/2013    Dr. Gala Romney- polypoid antral fold, bx= reactive gastropathy  . Givens capsule study N/A 05/02/2013    Procedure: GIVENS CAPSULE STUDY;  Surgeon: Daneil Dolin, MD;  Location: [facility];  Service: Endoscopy;  Laterality: N/A;  . Colonoscopy N/A 09/20/2013    SLF:A near circumferential fungating mass with friable surfaces was  found in the rectum.  ACTIVELY OOZING.  CLOTS SEEN IN LUMEN AND ASPIRATED.  Multiple biopsies were performed using cold forceps.  Thermal therapy(BICAP 7 Fr 25W) was used to ATTEMPT TO control bleeding.    HEMOSTASIS NOT ACHEIVED.  Marland Kitchen Cystoscopy w/ ureteral stent placement Left 09/24/2013    Procedure: CYSTOSCOPY WITH LEFT RETROGRADE PYELOGRAM; LEFT URETERAL STENT PLACEMENT; REMOVAL OF SMALL BLADDER CALCULI;  Surgeon: Marissa Nestle, MD;  Location: [facility];  Service: Urology;  Laterality: Left;  . Flexible sigmoidoscopy N/A 09/25/2013    KZS:WFUXNATFT exophytic rectal mass most likely representing recurrent adenocarcinoma status post biopsy  . Cystoscopy with  retrograde pyelogram, ureteroscopy and stent placement Left 05/08/2014    Procedure: CYSTOSCOPY/ EVACUATION OF CLOTS  WITH LEFT RETROGRADE PYELOGRAM,LEFT URETEROSCOPY ;  Surgeon: Malka So, MD;  Location: [facility];  Service: Urology;  Laterality: Left;  . Nephrolithotomy Left 05/13/2014    Procedure: LEFT NEPHROLITHOTOMY PERCUTANEOUS FIRST STAGE;  Surgeon: Malka So, MD;  Location: [facility];  Service: Urology;  Laterality: Left;  . Holmium laser application Left 73/22/0254    Procedure: HOLMIUM LASER APPLICATION;  Surgeon: Malka So, MD;  Location: [facility];  Service: Urology;  Laterality: Left;   Family History  Problem Relation Age of Onset  . Cervical cancer Mother   . Rectal cancer Father 13  . Uterine cancer Sister    History  Substance Use Topics  . Smoking status: Former Smoker -- 2.00 packs/day for 30 years    Types: Cigarettes, Cigars  . Smokeless tobacco: Former Systems developer    Types: Chew  . Alcohol Use: No     Comment: last drink 08/2013    Review of Systems  Constitutional: Negative for fever and appetite change.  Respiratory: Negative for shortness of breath.   Cardiovascular: Positive for chest pain.  Musculoskeletal: Positive for arthralgias.  Skin: Positive for wound.  All other systems reviewed and are negative.     Allergies  Daypro and Xeloda  Home Medications   Prior to Admission medications   Medication Sig Start Date End Date Taking? Authorizing Provider  amoxicillin (AMOXIL) 250 MG capsule Take 1 capsule (250 mg total) by mouth every 12 (twelve) hours. 05/18/14  Yes Erline Hau, MD  clindamycin (CLEOCIN-T) 1 % lotion Apply topically 2 (two) times daily. 04/07/14  Yes Manon Hilding Kefalas, PA-C  cyanocobalamin (,VITAMIN B-12,) 1000 MCG/ML injection Inject 1,000 mcg into the muscle every 30 (thirty) days.    Yes Historical Provider, MD  folic acid (FOLVITE) 1 MG tablet Take 1 tablet (1 mg total) by mouth daily. 04/28/14  Yes Baird Cancer, PA-C   HYDROcodone-acetaminophen (NORCO/VICODIN) 5-325 MG per tablet Take 1-2 tablets by mouth every 6 (six) hours as needed. Patient taking differently: Take 1-2 tablets by mouth every 6 (six) hours as needed for moderate pain.  06/09/14  Yes Baird Cancer, PA-C  hydrocortisone cream 1 % Apply 1 application topically 2 (two) times daily. Mix with urea cream and apply to affected areas. 04/23/14  Yes Manon Hilding Kefalas, PA-C  hydrocortisone-urea (CARMOL-HC) 1-10 % cream Apply topically 3 (three) times daily. 04/22/14  Yes Farrel Gobble, MD  lidocaine-prilocaine (EMLA) cream Apply a quarter size amount to port site 1 hour prior to chemo. Do not rub in. Cover with plastic wrap. 03/13/14  Yes Baird Cancer, PA-C  omeprazole (PRILOSEC) 20 MG capsule Take 1 capsule (20 mg total) by mouth daily. 09/28/13  Yes Rexene Alberts, MD  ondansetron [facility]  8 MG tablet Take 1 tablet (8 mg total) by mouth 2 (two) times daily as needed for nausea or vomiting. 03/13/14  Yes Manon Hilding Kefalas, PA-C  temazepam (RESTORIL) 15 MG capsule Take 1-2 capsules (15-30 mg total) by mouth at bedtime as needed for sleep. 06/09/14  Yes Manon Hilding Kefalas, PA-C  urea (CARMOL) 10 % cream Apply topically as needed. Mix with hydrocortisone cream and apply to affected areas. 04/23/14  Yes Thomas S Kefalas, PA-C   BP 117/54 mmHg  Pulse 81  Temp(Src) 97.9 F (36.6 C) (Oral)  Resp 18  Ht 5\' 10"  (1.778 m)  Wt 203 lb (92.08 kg)  BMI 29.13 kg/m2  SpO2 99% Physical Exam  Constitutional: He is oriented to person, place, and time. He appears well-developed and well-nourished.  HENT:  Head: Normocephalic and atraumatic.  Eyes: Right eye exhibits no discharge.  Neck: Neck supple. No tracheal deviation present.  Cardiovascular: Normal rate.   Pulmonary/Chest: Effort normal and breath sounds normal. No respiratory distress. He has no wheezes. He has no rales.  Abdominal: He exhibits no distension. There is no tenderness.  Genitourinary:   Colostomy bag and catheter.   Musculoskeletal: He exhibits tenderness.  Cervical spine non tender. Tenderness to left anterior mid chest with some echymosis, no crepitus. Tenderness over clavicle left side laterally. Skin is not tinted. No tenderness over elbow. Skin tear over dorusm of left hand and left forearm. Abrasion to right anterior shin. No tedneress in hips, knees or ankles.   Neurological: He is alert and oriented to person, place, and time.  Skin: Skin is warm and dry.  Psychiatric: He has a normal mood and affect. His behavior is normal.  Nursing note and vitals reviewed.   ED Course  Procedures   DIAGNOSTIC STUDIES: Oxygen Saturation is 99% on RA, normal by my interpretation.    COORDINATION OF CARE: 8:17 PM Discussed treatment plan with pt at bedside and pt agreed to plan.   Labs Review Labs Reviewed - No data to display  Imaging Review Dg Ribs Unilateral W/chest Left  07/09/2014   CLINICAL DATA:  Anterior left-sided rib pain. Tripped and fall injury. History of lung cancer, lung biopsy, ileostomy, MI, coronary disease, Port-A-Cath.  EXAM: LEFT RIBS AND CHEST - 3+ VIEW  COMPARISON:  CT chest 02/24/2014.  Chest 02/22/2013.  FINDINGS: Left central venous catheter is unchanged in position. Scarring in the right upper lung likely corresponds to patient's known malignancy. Heart size and pulmonary vascularity are normal. No focal airspace consolidation in the lungs. No blunting of costophrenic angles. No pneumothorax. Esophageal hiatal hernia behind the heart.  Acute fracture of the distal left clavicle with inferior displacement of the distal fracture fragment of about 1 shaft width. Acromioclavicular space is maintained. Left ribs appear intact. No displaced fractures or focal bone lesions are appreciated.  IMPRESSION: Nodular scarring in the right upper lung corresponding to patient's known lung cancer. No evidence of active pulmonary disease.  Displaced fracture of the distal  left clavicle. Left ribs appear intact.   Electronically Signed   By: Lucienne Capers M.D.   On: 07/09/2014 21:22   Dg Shoulder Left  07/09/2014   CLINICAL DATA:  Stated he got foot caught under chair in garage, tripped, landed on left side and shoulder  EXAM: LEFT SHOULDER - 2+ VIEW  COMPARISON:  None.  FINDINGS: Fracture of the distal clavicle with cephalad migration of the proximal fracture fragment by two bone widths. Approximately 15 mm right override.  IMPRESSION: Fracture of the distal clavicle with override.   Electronically Signed   By: Suzy Bouchard M.D.   On: 07/09/2014 21:22     EKG Interpretation None      MDM   Final diagnoses:  None   patient with chest contusion and clavicular fracture after fall. No tenting of skin. Given sling for comfort. Has pain medicines. Will follow with orthopedic surgery. Also a skin tear that does not appear to be suturable.  I personally performed the services described in this documentation, which was scribed in my presence. The recorded information has been reviewed and is accurate.       Jasper Riling. Alvino Chapel, MD 07/10/14 [phone]

## 2014-07-11 ENCOUNTER — Ambulatory Visit (INDEPENDENT_AMBULATORY_CARE_PROVIDER_SITE_OTHER): Payer: Self-pay | Admitting: Urology

## 2014-07-11 DIAGNOSIS — R339 Retention of urine, unspecified: Secondary | ICD-10-CM

## 2014-07-11 DIAGNOSIS — N135 Crossing vessel and stricture of ureter without hydronephrosis: Secondary | ICD-10-CM

## 2014-07-11 DIAGNOSIS — N312 Flaccid neuropathic bladder, not elsewhere classified: Secondary | ICD-10-CM

## 2014-07-14 ENCOUNTER — Encounter (HOSPITAL_COMMUNITY): Payer: Managed Care, Other (non HMO)

## 2014-07-14 ENCOUNTER — Inpatient Hospital Stay (HOSPITAL_COMMUNITY): Payer: Managed Care, Other (non HMO)

## 2014-07-14 ENCOUNTER — Ambulatory Visit: Payer: Managed Care, Other (non HMO) | Admitting: Orthopedic Surgery

## 2014-07-15 ENCOUNTER — Encounter (HOSPITAL_COMMUNITY): Payer: Self-pay

## 2014-07-15 ENCOUNTER — Encounter (HOSPITAL_BASED_OUTPATIENT_CLINIC_OR_DEPARTMENT_OTHER): Payer: Managed Care, Other (non HMO)

## 2014-07-15 ENCOUNTER — Ambulatory Visit (INDEPENDENT_AMBULATORY_CARE_PROVIDER_SITE_OTHER): Payer: Managed Care, Other (non HMO) | Admitting: Orthopedic Surgery

## 2014-07-15 ENCOUNTER — Inpatient Hospital Stay (HOSPITAL_COMMUNITY): Payer: Managed Care, Other (non HMO)

## 2014-07-15 ENCOUNTER — Encounter: Payer: Self-pay | Admitting: Orthopedic Surgery

## 2014-07-15 VITALS — BP 127/71 | Ht 70.0 in | Wt 203.0 lb

## 2014-07-15 VITALS — BP 116/55 | HR 67 | Temp 98.0°F | Resp 16 | Wt 207.8 lb

## 2014-07-15 DIAGNOSIS — C20 Malignant neoplasm of rectum: Secondary | ICD-10-CM

## 2014-07-15 DIAGNOSIS — D630 Anemia in neoplastic disease: Secondary | ICD-10-CM

## 2014-07-15 DIAGNOSIS — Z5112 Encounter for antineoplastic immunotherapy: Secondary | ICD-10-CM | POA: Diagnosis not present

## 2014-07-15 DIAGNOSIS — C7801 Secondary malignant neoplasm of right lung: Secondary | ICD-10-CM

## 2014-07-15 DIAGNOSIS — E538 Deficiency of other specified B group vitamins: Secondary | ICD-10-CM

## 2014-07-15 DIAGNOSIS — S42002A Fracture of unspecified part of left clavicle, initial encounter for closed fracture: Secondary | ICD-10-CM

## 2014-07-15 LAB — COMPREHENSIVE METABOLIC PANEL
ALT: 20 U/L (ref 0–53)
AST: 30 U/L (ref 0–37)
Albumin: 2.7 g/dL — ABNORMAL LOW (ref 3.5–5.2)
Alkaline Phosphatase: 90 U/L (ref 39–117)
Anion gap: 4 — ABNORMAL LOW (ref 5–15)
BUN: 13 mg/dL (ref 6–23)
CHLORIDE: 116 mmol/L — AB (ref 96–112)
CO2: 20 mmol/L (ref 19–32)
Calcium: 8.6 mg/dL (ref 8.4–10.5)
Creatinine, Ser: 1.43 mg/dL — ABNORMAL HIGH (ref 0.50–1.35)
GFR calc Af Amer: 60 mL/min — ABNORMAL LOW (ref >90)
GFR, EST NON AFRICAN AMERICAN: 51 mL/min — AB (ref >90)
Glucose, Bld: 89 mg/dL (ref 70–99)
POTASSIUM: 4.1 mmol/L (ref 3.5–5.1)
SODIUM: 140 mmol/L (ref 135–145)
Total Bilirubin: 1.4 mg/dL — ABNORMAL HIGH (ref 0.3–1.2)
Total Protein: 5.6 g/dL — ABNORMAL LOW (ref 6.0–8.3)

## 2014-07-15 LAB — CBC WITH DIFFERENTIAL/PLATELET
Basophils Absolute: 0 10*3/uL (ref 0.0–0.1)
Basophils Relative: 0 % (ref 0–1)
Eosinophils Absolute: 0.1 10*3/uL (ref 0.0–0.7)
Eosinophils Relative: 3 % (ref 0–5)
HCT: 23.7 % — ABNORMAL LOW (ref 39.0–52.0)
Hemoglobin: 7.6 g/dL — ABNORMAL LOW (ref 13.0–17.0)
LYMPHS ABS: 0.4 10*3/uL — AB (ref 0.7–4.0)
Lymphocytes Relative: 13 % (ref 12–46)
MCH: 32.9 pg (ref 26.0–34.0)
MCHC: 32.1 g/dL (ref 30.0–36.0)
MCV: 102.6 fL — ABNORMAL HIGH (ref 78.0–100.0)
Monocytes Absolute: 0.4 10*3/uL (ref 0.1–1.0)
Monocytes Relative: 12 % (ref 3–12)
NEUTROS ABS: 2.2 10*3/uL (ref 1.7–7.7)
NEUTROS PCT: 72 % (ref 43–77)
PLATELETS: 51 10*3/uL — AB (ref 150–400)
RBC: 2.31 MIL/uL — AB (ref 4.22–5.81)
RDW: 17.4 % — ABNORMAL HIGH (ref 11.5–15.5)
WBC: 3 10*3/uL — ABNORMAL LOW (ref 4.0–10.5)

## 2014-07-15 LAB — MAGNESIUM: Magnesium: 1.6 mg/dL (ref 1.5–2.5)

## 2014-07-15 LAB — PREPARE RBC (CROSSMATCH)

## 2014-07-15 MED ORDER — HYDROCODONE-ACETAMINOPHEN 10-325 MG PO TABS: 1.0000 | ORAL_TABLET | ORAL | Status: DC | PRN

## 2014-07-15 MED ORDER — CETUXIMAB CHEMO IV INJECTION 200 MG/100ML
200.0000 mg/m2 | Freq: Once | INTRAVENOUS | Status: AC
Start: 2014-07-15 — End: 2014-07-15
  Administered 2014-07-15: 400 mg via INTRAVENOUS
  Filled 2014-07-15: qty 200

## 2014-07-15 MED ORDER — SODIUM CHLORIDE 0.9 % IV SOLN
Freq: Once | INTRAVENOUS | Status: AC
Start: 2014-07-15 — End: 2014-07-15
  Administered 2014-07-15: 14:00:00 via INTRAVENOUS

## 2014-07-15 MED ORDER — HEPARIN SOD (PORK) LOCK FLUSH 100 UNIT/ML IV SOLN
500.0000 [IU] | Freq: Once | INTRAVENOUS | Status: AC | PRN
  Administered 2014-07-15: 500 [IU]
  Filled 2014-07-15: qty 5

## 2014-07-15 MED ORDER — CYANOCOBALAMIN 1000 MCG/ML IJ SOLN
1000.0000 ug | Freq: Once | INTRAMUSCULAR | Status: AC
  Administered 2014-07-15: 1000 ug via INTRAMUSCULAR

## 2014-07-15 MED ORDER — CYANOCOBALAMIN 1000 MCG/ML IJ SOLN
INTRAMUSCULAR | Status: AC
  Filled 2014-07-15: qty 1

## 2014-07-15 MED ORDER — SODIUM CHLORIDE 0.9 % IJ SOLN: 10.0000 mL | INTRAMUSCULAR | Status: DC | PRN

## 2014-07-15 MED ORDER — DIPHENHYDRAMINE HCL 50 MG/ML IJ SOLN
50.0000 mg | Freq: Once | INTRAMUSCULAR | Status: AC
  Administered 2014-07-15: 50 mg via INTRAVENOUS
  Filled 2014-07-15: qty 1

## 2014-07-15 NOTE — Patient Instructions (Signed)
Sling x 3 weeks, remove as needed for bathing/dressing

## 2014-07-15 NOTE — Progress Notes (Signed)
Tolerated chemo well. 

## 2014-07-15 NOTE — Addendum Note (Signed)
Addended by: Baird Cancer on: 07/15/2014 05:26 PM   Modules accepted: Orders, SmartSet

## 2014-07-15 NOTE — Patient Instructions (Addendum)
[facility] Discharge Instructions for Patients Receiving Chemotherapy  Today you received the following chemotherapy agents weekly Erbitux. You also received a B12 injection. Your hemoglobin is low. You will need a blood transfusion. Return to clinic Thursday for blood transfusion. To help prevent nausea and vomiting after your treatment, we encourage you to take your nausea medication as instructed. If you develop nausea and vomiting that is not controlled by your nausea medication, call the clinic. If it is after clinic hours your family physician or the after hours number for the clinic or go to the Emergency Department. BELOW ARE SYMPTOMS THAT SHOULD BE REPORTED IMMEDIATELY:  *FEVER GREATER THAN 101.0 F  *CHILLS WITH OR WITHOUT FEVER  NAUSEA AND VOMITING THAT IS NOT CONTROLLED WITH YOUR NAUSEA MEDICATION  *UNUSUAL SHORTNESS OF BREATH  *UNUSUAL BRUISING OR BLEEDING  TENDERNESS IN MOUTH AND THROAT WITH OR WITHOUT PRESENCE OF ULCERS  *URINARY PROBLEMS  *BOWEL PROBLEMS  UNUSUAL RASH Items with * indicate a potential emergency and should be followed up as soon as possible.  Return as scheduled.  I have been informed and understand all the instructions given to me. I know to contact the clinic, my physician, or go to the Emergency Department if any problems should occur. I do not have any questions at this time, but understand that I may call the clinic during office hours or the Patient Navigator at [phone] should I have any questions or need assistance in obtaining follow up care.    __________________________________________  _____________  __________ Signature of Patient or Authorized Representative            Date                   Time    __________________________________________ Nurse's Signature

## 2014-07-15 NOTE — Progress Notes (Signed)
   Subjective:    Patient ID: [name], male    DOB: 03/10/1951, 64 y.o.   MRN: [id number]  Chief Complaint  Patient presents with  . Follow-up    er follow up Rt clavicle fx, DOI 07/09/14    HPI  64 year old male with severe medical problems including cancer and low platelets presents with left shoulder pain dull moderate constant since his injury and fall on February 10. X-ray showed type II distal clavicle fracture.  Review of Systems Easy bleeding skin tears and ecchymosis from this injury urinary issues and GI issues related to his ileostomy.    Objective:   Physical Exam  Constitutional: He is oriented to person, place, and time. He appears well-developed and well-nourished.  Musculoskeletal:       Left shoulder: He exhibits decreased range of motion, tenderness, bony tenderness, swelling, deformity, pain and decreased strength.       Arms: Normal ambulatory status  Lymphadenopathy:       Left: No epitrochlear adenopathy present.  Neurological: He is alert and oriented to person, place, and time.  Normal sensation without pathologic reflexes deep tendon reflexes deferred coordination balance normal  Skin:  Multiple skin issues. Skin tear hand skin tear forearm intertriginous at elbow flexion crease rash  Psychiatric: He has a normal mood and affect. His behavior is normal. Judgment and thought content normal.  Vitals reviewed.         Assessment & Plan:  And an x-ray interpretation type II a distal clavicle fracture  Based on the patient's medical condition low platelets we will have to treat this nonoperatively with sling  I treated the wounds with Xeroform dressings gauze and 4 x 4's change in a week

## 2014-07-17 ENCOUNTER — Encounter (HOSPITAL_BASED_OUTPATIENT_CLINIC_OR_DEPARTMENT_OTHER): Payer: Managed Care, Other (non HMO)

## 2014-07-17 ENCOUNTER — Encounter (HOSPITAL_COMMUNITY): Payer: Self-pay

## 2014-07-17 DIAGNOSIS — C20 Malignant neoplasm of rectum: Secondary | ICD-10-CM

## 2014-07-17 DIAGNOSIS — D61818 Other pancytopenia: Secondary | ICD-10-CM

## 2014-07-17 DIAGNOSIS — E538 Deficiency of other specified B group vitamins: Secondary | ICD-10-CM | POA: Diagnosis not present

## 2014-07-17 DIAGNOSIS — C7801 Secondary malignant neoplasm of right lung: Secondary | ICD-10-CM

## 2014-07-17 DIAGNOSIS — D630 Anemia in neoplastic disease: Secondary | ICD-10-CM

## 2014-07-17 MED ORDER — ACETAMINOPHEN 325 MG PO TABS
650.0000 mg | ORAL_TABLET | Freq: Once | ORAL | Status: AC
  Administered 2014-07-17: 650 mg via ORAL

## 2014-07-17 MED ORDER — HEPARIN SOD (PORK) LOCK FLUSH 100 UNIT/ML IV SOLN
INTRAVENOUS | Status: AC
  Filled 2014-07-17: qty 5

## 2014-07-17 MED ORDER — ACETAMINOPHEN 325 MG PO TABS
ORAL_TABLET | ORAL | Status: AC
Start: 2014-07-17 — End: 2014-07-17
  Filled 2014-07-17: qty 2

## 2014-07-17 MED ORDER — HEPARIN SOD (PORK) LOCK FLUSH 100 UNIT/ML IV SOLN
500.0000 [IU] | Freq: Once | INTRAVENOUS | Status: AC
  Administered 2014-07-17: 500 [IU] via INTRAVENOUS

## 2014-07-17 MED ORDER — SODIUM CHLORIDE 0.9 % IV SOLN
Freq: Once | INTRAVENOUS | Status: AC
  Administered 2014-07-17: 09:00:00 via INTRAVENOUS

## 2014-07-17 MED ORDER — DIPHENHYDRAMINE HCL 25 MG PO CAPS
ORAL_CAPSULE | ORAL | Status: AC
  Filled 2014-07-17: qty 1

## 2014-07-17 MED ORDER — DIPHENHYDRAMINE HCL 25 MG PO CAPS
25.0000 mg | ORAL_CAPSULE | Freq: Once | ORAL | Status: AC
  Administered 2014-07-17: 25 mg via ORAL

## 2014-07-17 MED ORDER — SODIUM CHLORIDE 0.9 % IJ SOLN
10.0000 mL | Freq: Once | INTRAMUSCULAR | Status: AC
  Administered 2014-07-17: 10 mL via INTRAVENOUS

## 2014-07-17 NOTE — Progress Notes (Signed)
Tolerated well

## 2014-07-18 LAB — TYPE AND SCREEN
ABO/RH(D): A POS
ANTIBODY SCREEN: NEGATIVE
UNIT DIVISION: 0
Unit division: 0

## 2014-07-21 ENCOUNTER — Encounter (HOSPITAL_BASED_OUTPATIENT_CLINIC_OR_DEPARTMENT_OTHER): Payer: Managed Care, Other (non HMO)

## 2014-07-21 ENCOUNTER — Encounter (HOSPITAL_COMMUNITY): Payer: Self-pay | Admitting: Hematology & Oncology

## 2014-07-21 ENCOUNTER — Encounter (HOSPITAL_BASED_OUTPATIENT_CLINIC_OR_DEPARTMENT_OTHER): Payer: Managed Care, Other (non HMO) | Admitting: Hematology & Oncology

## 2014-07-21 VITALS — BP 113/50 | HR 65 | Temp 97.9°F | Resp 18

## 2014-07-21 VITALS — BP 121/61 | HR 68 | Temp 98.1°F | Resp 18 | Wt 210.4 lb

## 2014-07-21 DIAGNOSIS — C20 Malignant neoplasm of rectum: Secondary | ICD-10-CM | POA: Diagnosis not present

## 2014-07-21 DIAGNOSIS — D61818 Other pancytopenia: Secondary | ICD-10-CM | POA: Diagnosis not present

## 2014-07-21 DIAGNOSIS — C787 Secondary malignant neoplasm of liver and intrahepatic bile duct: Secondary | ICD-10-CM | POA: Diagnosis not present

## 2014-07-21 DIAGNOSIS — Z5112 Encounter for antineoplastic immunotherapy: Secondary | ICD-10-CM

## 2014-07-21 DIAGNOSIS — C7800 Secondary malignant neoplasm of unspecified lung: Secondary | ICD-10-CM

## 2014-07-21 DIAGNOSIS — E538 Deficiency of other specified B group vitamins: Secondary | ICD-10-CM | POA: Diagnosis not present

## 2014-07-21 DIAGNOSIS — S42002A Fracture of unspecified part of left clavicle, initial encounter for closed fracture: Secondary | ICD-10-CM

## 2014-07-21 LAB — COMPREHENSIVE METABOLIC PANEL
ALK PHOS: 101 U/L (ref 39–117)
ALT: 17 U/L (ref 0–53)
AST: 29 U/L (ref 0–37)
Albumin: 2.6 g/dL — ABNORMAL LOW (ref 3.5–5.2)
BUN: 14 mg/dL (ref 6–23)
CALCIUM: 8.1 mg/dL — AB (ref 8.4–10.5)
CHLORIDE: 117 mmol/L — AB (ref 96–112)
CO2: 21 mmol/L (ref 19–32)
CREATININE: 1.46 mg/dL — AB (ref 0.50–1.35)
GFR calc Af Amer: 58 mL/min — ABNORMAL LOW (ref >90)
GFR calc non Af Amer: 50 mL/min — ABNORMAL LOW (ref >90)
Glucose, Bld: 91 mg/dL (ref 70–99)
Potassium: 3.8 mmol/L (ref 3.5–5.1)
SODIUM: 138 mmol/L (ref 135–145)
Total Bilirubin: 1.4 mg/dL — ABNORMAL HIGH (ref 0.3–1.2)
Total Protein: 5.9 g/dL — ABNORMAL LOW (ref 6.0–8.3)

## 2014-07-21 LAB — CBC WITH DIFFERENTIAL/PLATELET
BASOS ABS: 0 10*3/uL (ref 0.0–0.1)
BASOS PCT: 0 % (ref 0–1)
EOS PCT: 6 % — AB (ref 0–5)
Eosinophils Absolute: 0.2 10*3/uL (ref 0.0–0.7)
HEMATOCRIT: 29.6 % — AB (ref 39.0–52.0)
Hemoglobin: 9.4 g/dL — ABNORMAL LOW (ref 13.0–17.0)
Lymphocytes Relative: 12 % (ref 12–46)
Lymphs Abs: 0.4 10*3/uL — ABNORMAL LOW (ref 0.7–4.0)
MCH: 32.2 pg (ref 26.0–34.0)
MCHC: 31.8 g/dL (ref 30.0–36.0)
MCV: 101.4 fL — AB (ref 78.0–100.0)
MONOS PCT: 10 % (ref 3–12)
Monocytes Absolute: 0.3 10*3/uL (ref 0.1–1.0)
Neutro Abs: 2.5 10*3/uL (ref 1.7–7.7)
Neutrophils Relative %: 72 % (ref 43–77)
PLATELETS: 48 10*3/uL — AB (ref 150–400)
RBC: 2.92 MIL/uL — AB (ref 4.22–5.81)
RDW: 17.3 % — AB (ref 11.5–15.5)
WBC: 3.5 10*3/uL — ABNORMAL LOW (ref 4.0–10.5)

## 2014-07-21 MED ORDER — CETUXIMAB CHEMO IV INJECTION 200 MG/100ML
200.0000 mg/m2 | Freq: Once | INTRAVENOUS | Status: AC
  Administered 2014-07-21: 400 mg via INTRAVENOUS
  Filled 2014-07-21: qty 200

## 2014-07-21 MED ORDER — HEPARIN SOD (PORK) LOCK FLUSH 100 UNIT/ML IV SOLN
500.0000 [IU] | Freq: Once | INTRAVENOUS | Status: AC | PRN
  Administered 2014-07-21: 500 [IU]

## 2014-07-21 MED ORDER — DIPHENHYDRAMINE HCL 50 MG/ML IJ SOLN
INTRAMUSCULAR | Status: AC
  Filled 2014-07-21: qty 1

## 2014-07-21 MED ORDER — SODIUM CHLORIDE 0.9 % IJ SOLN
10.0000 mL | INTRAMUSCULAR | Status: DC | PRN
  Administered 2014-07-21: 10 mL
  Filled 2014-07-21: qty 10

## 2014-07-21 MED ORDER — SODIUM CHLORIDE 0.9 % IV SOLN
Freq: Once | INTRAVENOUS | Status: AC
  Administered 2014-07-21: 11:00:00 via INTRAVENOUS

## 2014-07-21 MED ORDER — DIPHENHYDRAMINE HCL 50 MG/ML IJ SOLN
50.0000 mg | Freq: Once | INTRAMUSCULAR | Status: AC
  Administered 2014-07-21: 50 mg via INTRAVENOUS

## 2014-07-21 NOTE — Patient Instructions (Signed)
[facility] Discharge Instructions for Patients Receiving Chemotherapy  Today you received the following chemotherapy agents erbitux Please call the clinic if you have any questions or concerns  To help prevent nausea and vomiting after your treatment, we encourage you to take your nausea medication.   If you develop nausea and vomiting that is not controlled by your nausea medication, call the clinic. If it is after clinic hours your family physician or the after hours number for the clinic or go to the Emergency Department.   BELOW ARE SYMPTOMS THAT SHOULD BE REPORTED IMMEDIATELY:  *FEVER GREATER THAN 101.0 F  *CHILLS WITH OR WITHOUT FEVER  NAUSEA AND VOMITING THAT IS NOT CONTROLLED WITH YOUR NAUSEA MEDICATION  *UNUSUAL SHORTNESS OF BREATH  *UNUSUAL BRUISING OR BLEEDING  TENDERNESS IN MOUTH AND THROAT WITH OR WITHOUT PRESENCE OF ULCERS  *URINARY PROBLEMS  *BOWEL PROBLEMS  UNUSUAL RASH Items with * indicate a potential emergency and should be followed up as soon as possible.  One of the nurses will contact you 24 hours after your treatment. Please let the nurse know about any problems that you may have experienced. Feel free to call the clinic you have any questions or concerns. The clinic phone number is [phone].   I have been informed and understand all the instructions given to me. I know to contact the clinic, my physician, or go to the Emergency Department if any problems should occur. I do not have any questions at this time, but understand that I may call the clinic during office hours or the Patient Navigator at [phone] should I have any questions or need assistance in obtaining follow up care.

## 2014-07-21 NOTE — Progress Notes (Signed)
Tonnie R Gallacher Tolerated chemotherapy well today.  Discharged ambulatory

## 2014-07-22 ENCOUNTER — Encounter: Payer: Self-pay | Admitting: Orthopedic Surgery

## 2014-07-22 ENCOUNTER — Ambulatory Visit (INDEPENDENT_AMBULATORY_CARE_PROVIDER_SITE_OTHER): Payer: Self-pay | Admitting: Orthopedic Surgery

## 2014-07-22 VITALS — BP 122/72 | Ht 70.0 in | Wt 210.4 lb

## 2014-07-22 DIAGNOSIS — S42002D Fracture of unspecified part of left clavicle, subsequent encounter for fracture with routine healing: Secondary | ICD-10-CM

## 2014-07-22 NOTE — Progress Notes (Signed)
Chief Complaint  Patient presents with  . Follow-up    1 week follow up wound and clavicle fracture, DOI 07/09/14   Encounter Diagnosis  Name Primary?  . Clavicle fracture, left, with routine healing, subsequent encounter Yes    All dressings were changed. All wounds have improved. Redressed change in a week

## 2014-07-24 ENCOUNTER — Ambulatory Visit: Payer: Managed Care, Other (non HMO) | Admitting: Orthopedic Surgery

## 2014-07-28 ENCOUNTER — Encounter (HOSPITAL_BASED_OUTPATIENT_CLINIC_OR_DEPARTMENT_OTHER): Payer: Managed Care, Other (non HMO)

## 2014-07-28 ENCOUNTER — Other Ambulatory Visit (HOSPITAL_COMMUNITY): Payer: Self-pay | Admitting: Oncology

## 2014-07-28 ENCOUNTER — Encounter (HOSPITAL_COMMUNITY): Payer: Self-pay

## 2014-07-28 VITALS — BP 116/51 | HR 71 | Temp 98.1°F | Resp 18 | Wt 210.8 lb

## 2014-07-28 DIAGNOSIS — C20 Malignant neoplasm of rectum: Secondary | ICD-10-CM | POA: Diagnosis not present

## 2014-07-28 DIAGNOSIS — Z5112 Encounter for antineoplastic immunotherapy: Secondary | ICD-10-CM | POA: Diagnosis not present

## 2014-07-28 DIAGNOSIS — E538 Deficiency of other specified B group vitamins: Secondary | ICD-10-CM | POA: Diagnosis not present

## 2014-07-28 DIAGNOSIS — C7801 Secondary malignant neoplasm of right lung: Secondary | ICD-10-CM

## 2014-07-28 LAB — COMPREHENSIVE METABOLIC PANEL
ALBUMIN: 2.6 g/dL — AB (ref 3.5–5.2)
ALT: 20 U/L (ref 0–53)
AST: 37 U/L (ref 0–37)
Alkaline Phosphatase: 111 U/L (ref 39–117)
BILIRUBIN TOTAL: 1 mg/dL (ref 0.3–1.2)
BUN: 13 mg/dL (ref 6–23)
CALCIUM: 8.3 mg/dL — AB (ref 8.4–10.5)
CO2: 22 mmol/L (ref 19–32)
CREATININE: 1.33 mg/dL (ref 0.50–1.35)
Chloride: 115 mmol/L — ABNORMAL HIGH (ref 96–112)
GFR calc Af Amer: 65 mL/min — ABNORMAL LOW (ref >90)
GFR calc non Af Amer: 56 mL/min — ABNORMAL LOW (ref >90)
GLUCOSE: 88 mg/dL (ref 70–99)
Potassium: 3.5 mmol/L (ref 3.5–5.1)
Sodium: 139 mmol/L (ref 135–145)
TOTAL PROTEIN: 5.9 g/dL — AB (ref 6.0–8.3)

## 2014-07-28 LAB — MAGNESIUM: MAGNESIUM: 1.4 mg/dL — AB (ref 1.5–2.5)

## 2014-07-28 LAB — CBC WITH DIFFERENTIAL/PLATELET
Basophils Absolute: 0 10*3/uL (ref 0.0–0.1)
Basophils Relative: 0 % (ref 0–1)
EOS ABS: 0.1 10*3/uL (ref 0.0–0.7)
Eosinophils Relative: 4 % (ref 0–5)
HEMATOCRIT: 27.9 % — AB (ref 39.0–52.0)
HEMOGLOBIN: 8.9 g/dL — AB (ref 13.0–17.0)
LYMPHS ABS: 0.4 10*3/uL — AB (ref 0.7–4.0)
Lymphocytes Relative: 11 % — ABNORMAL LOW (ref 12–46)
MCH: 33 pg (ref 26.0–34.0)
MCHC: 31.9 g/dL (ref 30.0–36.0)
MCV: 103.3 fL — ABNORMAL HIGH (ref 78.0–100.0)
MONO ABS: 0.3 10*3/uL (ref 0.1–1.0)
MONOS PCT: 9 % (ref 3–12)
Neutro Abs: 2.4 10*3/uL (ref 1.7–7.7)
Neutrophils Relative %: 75 % (ref 43–77)
Platelets: 54 10*3/uL — ABNORMAL LOW (ref 150–400)
RBC: 2.7 MIL/uL — ABNORMAL LOW (ref 4.22–5.81)
RDW: 15 % (ref 11.5–15.5)
WBC: 3.2 10*3/uL — ABNORMAL LOW (ref 4.0–10.5)

## 2014-07-28 MED ORDER — SODIUM CHLORIDE 0.9 % IV SOLN
Freq: Once | INTRAVENOUS | Status: AC
  Administered 2014-07-28: 11:00:00 via INTRAVENOUS

## 2014-07-28 MED ORDER — CETUXIMAB CHEMO IV INJECTION 200 MG/100ML
200.0000 mg/m2 | Freq: Once | INTRAVENOUS | Status: AC
  Administered 2014-07-28: 400 mg via INTRAVENOUS
  Filled 2014-07-28: qty 200

## 2014-07-28 MED ORDER — DIPHENHYDRAMINE HCL 50 MG/ML IJ SOLN
50.0000 mg | Freq: Once | INTRAMUSCULAR | Status: AC
  Administered 2014-07-28: 50 mg via INTRAVENOUS

## 2014-07-28 MED ORDER — HEPARIN SOD (PORK) LOCK FLUSH 100 UNIT/ML IV SOLN
500.0000 [IU] | Freq: Once | INTRAVENOUS | Status: AC | PRN
  Administered 2014-07-28: 500 [IU]
  Filled 2014-07-28: qty 5

## 2014-07-28 MED ORDER — DIPHENHYDRAMINE HCL 50 MG/ML IJ SOLN
INTRAMUSCULAR | Status: AC
  Filled 2014-07-28: qty 1

## 2014-07-28 MED ORDER — SODIUM CHLORIDE 0.9 % IJ SOLN: 10.0000 mL | INTRAMUSCULAR | Status: DC | PRN

## 2014-07-28 NOTE — Progress Notes (Signed)
[name] Tolerated chemotherapy well. Discharged ambulatory

## 2014-07-28 NOTE — Patient Instructions (Signed)
..  [facility] Discharge Instructions for Patients Receiving Chemotherapy  Today you received the following chemotherapy agents Erbitux  Return next week for Erbitux You will see Tom in 2 weeks   If you develop nausea and vomiting, or diarrhea that is not controlled by your medication, call the clinic.  The clinic phone number is [phone]. Office hours are Monday-Friday 8:30am-5:00pm.  BELOW ARE SYMPTOMS THAT SHOULD BE REPORTED IMMEDIATELY:  *FEVER GREATER THAN 101.0 F  *CHILLS WITH OR WITHOUT FEVER  NAUSEA AND VOMITING THAT IS NOT CONTROLLED WITH YOUR NAUSEA MEDICATION  *UNUSUAL SHORTNESS OF BREATH  *UNUSUAL BRUISING OR BLEEDING  TENDERNESS IN MOUTH AND THROAT WITH OR WITHOUT PRESENCE OF ULCERS  *URINARY PROBLEMS  *BOWEL PROBLEMS  UNUSUAL RASH Items with * indicate a potential emergency and should be followed up as soon as possible. If you have an emergency after office hours please contact your primary care physician or go to the nearest emergency department.  Please call the clinic during office hours if you have any questions or concerns.   You may also contact the Patient Navigator at [phone] should you have any questions or need assistance in obtaining follow up care. _____________________________________________________________________ Have you asked about our STAR program?    STAR stands for Survivorship Training and Rehabilitation, and this is a nationally recognized cancer care program that focuses on survivorship and rehabilitation.  Cancer and cancer treatments may cause problems, such as, pain, making you feel tired and keeping you from doing the things that you need or want to do. Cancer rehabilitation can help. Our goal is to reduce these troubling effects and help you have the best quality of life possible.  You may receive a survey from a nurse that asks questions about your current state of health.  Based on the survey results, all  eligible patients will be referred to the [facility] program for an evaluation so we can better serve you! A frequently asked questions sheet is available upon request.

## 2014-07-29 ENCOUNTER — Ambulatory Visit (INDEPENDENT_AMBULATORY_CARE_PROVIDER_SITE_OTHER): Payer: Self-pay | Admitting: Orthopedic Surgery

## 2014-07-29 VITALS — BP 131/82 | Ht 70.0 in | Wt 210.8 lb

## 2014-07-29 DIAGNOSIS — S42002D Fracture of unspecified part of left clavicle, subsequent encounter for fracture with routine healing: Secondary | ICD-10-CM

## 2014-07-29 NOTE — Progress Notes (Signed)
Chief Complaint  Patient presents with  . Follow-up    1 week follow up wound and clavicle fracture, DOI 07/09/14   Encounter Diagnosis  Name Primary?  . Clavicle fracture, left, with routine healing, subsequent encounter Yes    All dressings were changed. All wounds have improved. Redressed change in a week  xrays left shoulder next wk  Sling as needed

## 2014-07-31 ENCOUNTER — Other Ambulatory Visit (HOSPITAL_COMMUNITY): Payer: Self-pay | Admitting: Hematology & Oncology

## 2014-08-04 ENCOUNTER — Encounter (HOSPITAL_COMMUNITY): Payer: Managed Care, Other (non HMO) | Attending: Oncology

## 2014-08-04 ENCOUNTER — Encounter (HOSPITAL_COMMUNITY): Payer: Self-pay

## 2014-08-04 DIAGNOSIS — D619 Aplastic anemia, unspecified: Secondary | ICD-10-CM | POA: Insufficient documentation

## 2014-08-04 DIAGNOSIS — C20 Malignant neoplasm of rectum: Secondary | ICD-10-CM | POA: Diagnosis present

## 2014-08-04 DIAGNOSIS — Z5112 Encounter for antineoplastic immunotherapy: Secondary | ICD-10-CM

## 2014-08-04 DIAGNOSIS — C7801 Secondary malignant neoplasm of right lung: Secondary | ICD-10-CM | POA: Diagnosis present

## 2014-08-04 DIAGNOSIS — E538 Deficiency of other specified B group vitamins: Secondary | ICD-10-CM | POA: Diagnosis present

## 2014-08-04 LAB — COMPREHENSIVE METABOLIC PANEL
ALBUMIN: 2.7 g/dL — AB (ref 3.5–5.2)
ALT: 20 U/L (ref 0–53)
ANION GAP: 5 (ref 5–15)
AST: 36 U/L (ref 0–37)
Alkaline Phosphatase: 108 U/L (ref 39–117)
BUN: 14 mg/dL (ref 6–23)
CALCIUM: 8.4 mg/dL (ref 8.4–10.5)
CO2: 22 mmol/L (ref 19–32)
CREATININE: 1.51 mg/dL — AB (ref 0.50–1.35)
Chloride: 115 mmol/L — ABNORMAL HIGH (ref 96–112)
GFR calc non Af Amer: 48 mL/min — ABNORMAL LOW (ref >90)
GFR, EST AFRICAN AMERICAN: 56 mL/min — AB (ref >90)
GLUCOSE: 94 mg/dL (ref 70–99)
Potassium: 3.7 mmol/L (ref 3.5–5.1)
Sodium: 142 mmol/L (ref 135–145)
TOTAL PROTEIN: 6.1 g/dL (ref 6.0–8.3)
Total Bilirubin: 0.8 mg/dL (ref 0.3–1.2)

## 2014-08-04 LAB — CBC WITH DIFFERENTIAL/PLATELET
Basophils Absolute: 0 10*3/uL (ref 0.0–0.1)
Basophils Relative: 0 % (ref 0–1)
EOS PCT: 5 % (ref 0–5)
Eosinophils Absolute: 0.1 10*3/uL (ref 0.0–0.7)
HCT: 27 % — ABNORMAL LOW (ref 39.0–52.0)
Hemoglobin: 8.8 g/dL — ABNORMAL LOW (ref 13.0–17.0)
LYMPHS ABS: 0.4 10*3/uL — AB (ref 0.7–4.0)
LYMPHS PCT: 14 % (ref 12–46)
MCH: 33.3 pg (ref 26.0–34.0)
MCHC: 32.6 g/dL (ref 30.0–36.0)
MCV: 102.3 fL — AB (ref 78.0–100.0)
Monocytes Absolute: 0.3 10*3/uL (ref 0.1–1.0)
Monocytes Relative: 9 % (ref 3–12)
Neutro Abs: 2.1 10*3/uL (ref 1.7–7.7)
Neutrophils Relative %: 73 % (ref 43–77)
PLATELETS: 49 10*3/uL — AB (ref 150–400)
RBC: 2.64 MIL/uL — ABNORMAL LOW (ref 4.22–5.81)
RDW: 15.8 % — ABNORMAL HIGH (ref 11.5–15.5)
Smear Review: DECREASED
WBC: 2.9 10*3/uL — AB (ref 4.0–10.5)

## 2014-08-04 LAB — MAGNESIUM: MAGNESIUM: 1.4 mg/dL — AB (ref 1.5–2.5)

## 2014-08-04 MED ORDER — CETUXIMAB CHEMO IV INJECTION 200 MG/100ML
200.0000 mg/m2 | Freq: Once | INTRAVENOUS | Status: AC
  Administered 2014-08-04: 400 mg via INTRAVENOUS
  Filled 2014-08-04: qty 200

## 2014-08-04 MED ORDER — HEPARIN SOD (PORK) LOCK FLUSH 100 UNIT/ML IV SOLN
500.0000 [IU] | Freq: Once | INTRAVENOUS | Status: AC | PRN
  Administered 2014-08-04: 500 [IU]
  Filled 2014-08-04: qty 5

## 2014-08-04 MED ORDER — SODIUM CHLORIDE 0.9 % IJ SOLN
10.0000 mL | INTRAMUSCULAR | Status: DC | PRN
  Administered 2014-08-04: 10 mL
  Filled 2014-08-04: qty 10

## 2014-08-04 MED ORDER — HEPARIN SOD (PORK) LOCK FLUSH 100 UNIT/ML IV SOLN
INTRAVENOUS | Status: AC
  Filled 2014-08-04: qty 5

## 2014-08-04 MED ORDER — SODIUM CHLORIDE 0.9 % IV SOLN
Freq: Once | INTRAVENOUS | Status: AC
  Administered 2014-08-04: 11:00:00 via INTRAVENOUS

## 2014-08-04 MED ORDER — DIPHENHYDRAMINE HCL 50 MG/ML IJ SOLN
50.0000 mg | Freq: Once | INTRAMUSCULAR | Status: AC
  Administered 2014-08-04: 50 mg via INTRAVENOUS
  Filled 2014-08-04: qty 1

## 2014-08-04 NOTE — Patient Instructions (Signed)
..  [facility] Discharge Instructions for Patients Receiving Chemotherapy  Today you received the following chemotherapy agents erbitux  Hgb is 8.8 , let us know if you start to feel weaker before next week    If you develop nausea and vomiting, or diarrhea that is not controlled by your medication, call the clinic.  The clinic phone number is [phone]. Office hours are Monday-Friday 8:30am-5:00pm.  BELOW ARE SYMPTOMS THAT SHOULD BE REPORTED IMMEDIATELY:  *FEVER GREATER THAN 101.0 F  *CHILLS WITH OR WITHOUT FEVER  NAUSEA AND VOMITING THAT IS NOT CONTROLLED WITH YOUR NAUSEA MEDICATION  *UNUSUAL SHORTNESS OF BREATH  *UNUSUAL BRUISING OR BLEEDING  TENDERNESS IN MOUTH AND THROAT WITH OR WITHOUT PRESENCE OF ULCERS  *URINARY PROBLEMS  *BOWEL PROBLEMS  UNUSUAL RASH Items with * indicate a potential emergency and should be followed up as soon as possible. If you have an emergency after office hours please contact your primary care physician or go to the nearest emergency department.  Please call the clinic during office hours if you have any questions or concerns.   You may also contact the Patient Navigator at [phone] should you have any questions or need assistance in obtaining follow up care. _____________________________________________________________________ Have you asked about our STAR program?    STAR stands for Survivorship Training and Rehabilitation, and this is a nationally recognized cancer care program that focuses on survivorship and rehabilitation.  Cancer and cancer treatments may cause problems, such as, pain, making you feel tired and keeping you from doing the things that you need or want to do. Cancer rehabilitation can help. Our goal is to reduce these troubling effects and help you have the best quality of life possible.  You may receive a survey from a nurse that asks questions about your current state of health.  Based on the survey  results, all eligible patients will be referred to the [facility] program for an evaluation so we can better serve you! A frequently asked questions sheet is available upon request.

## 2014-08-04 NOTE — Progress Notes (Signed)
Tolerated well

## 2014-08-05 ENCOUNTER — Ambulatory Visit (INDEPENDENT_AMBULATORY_CARE_PROVIDER_SITE_OTHER): Payer: Self-pay | Admitting: Orthopedic Surgery

## 2014-08-05 ENCOUNTER — Ambulatory Visit (INDEPENDENT_AMBULATORY_CARE_PROVIDER_SITE_OTHER): Payer: Managed Care, Other (non HMO)

## 2014-08-05 VITALS — BP 128/78 | Ht 70.0 in | Wt 205.0 lb

## 2014-08-05 DIAGNOSIS — S42001D Fracture of unspecified part of right clavicle, subsequent encounter for fracture with routine healing: Secondary | ICD-10-CM

## 2014-08-05 DIAGNOSIS — S42002S Fracture of unspecified part of left clavicle, sequela: Secondary | ICD-10-CM

## 2014-08-05 LAB — CEA: CEA: 7.6 ng/mL — ABNORMAL HIGH (ref 0.0–4.7)

## 2014-08-05 MED ORDER — HYDROCODONE-ACETAMINOPHEN 7.5-325 MG PO TABS: 1.0000 | ORAL_TABLET | Freq: Four times a day (QID) | ORAL | Status: DC | PRN

## 2014-08-05 NOTE — Progress Notes (Signed)
Fracture care follow-up  Distal clavicle fracture x-ray show prominent clavicle clinical exam shows prominent clavicle no skin break although he has some skin tenting  I discussed with him the fact that his medical condition precludes any major surgery on the shoulder at this point  He can start gentle range of motion exercises and wear his sling when his arm gets tired. We reduced his medication dose but continued his hydrocodone he'll follow-up in 6-8 weeks after doing some Codman exercises.

## 2014-08-08 ENCOUNTER — Ambulatory Visit (INDEPENDENT_AMBULATORY_CARE_PROVIDER_SITE_OTHER): Payer: Managed Care, Other (non HMO) | Admitting: Urology

## 2014-08-08 DIAGNOSIS — N312 Flaccid neuropathic bladder, not elsewhere classified: Secondary | ICD-10-CM | POA: Diagnosis not present

## 2014-08-08 DIAGNOSIS — R339 Retention of urine, unspecified: Secondary | ICD-10-CM | POA: Diagnosis not present

## 2014-08-08 DIAGNOSIS — N135 Crossing vessel and stricture of ureter without hydronephrosis: Secondary | ICD-10-CM | POA: Diagnosis not present

## 2014-08-11 ENCOUNTER — Encounter (HOSPITAL_BASED_OUTPATIENT_CLINIC_OR_DEPARTMENT_OTHER): Payer: Managed Care, Other (non HMO)

## 2014-08-11 ENCOUNTER — Encounter (HOSPITAL_BASED_OUTPATIENT_CLINIC_OR_DEPARTMENT_OTHER): Payer: Managed Care, Other (non HMO) | Admitting: Oncology

## 2014-08-11 ENCOUNTER — Encounter (HOSPITAL_COMMUNITY): Payer: Self-pay | Admitting: Oncology

## 2014-08-11 ENCOUNTER — Encounter (HOSPITAL_COMMUNITY): Payer: Managed Care, Other (non HMO)

## 2014-08-11 VITALS — BP 119/66 | HR 66 | Temp 98.2°F | Resp 18 | Wt 199.2 lb

## 2014-08-11 DIAGNOSIS — D61818 Other pancytopenia: Secondary | ICD-10-CM

## 2014-08-11 DIAGNOSIS — E538 Deficiency of other specified B group vitamins: Secondary | ICD-10-CM

## 2014-08-11 DIAGNOSIS — C787 Secondary malignant neoplasm of liver and intrahepatic bile duct: Secondary | ICD-10-CM | POA: Diagnosis not present

## 2014-08-11 DIAGNOSIS — C20 Malignant neoplasm of rectum: Secondary | ICD-10-CM

## 2014-08-11 DIAGNOSIS — C7801 Secondary malignant neoplasm of right lung: Secondary | ICD-10-CM | POA: Diagnosis not present

## 2014-08-11 DIAGNOSIS — Z5112 Encounter for antineoplastic immunotherapy: Secondary | ICD-10-CM

## 2014-08-11 LAB — CBC WITH DIFFERENTIAL/PLATELET
BASOS ABS: 0 10*3/uL (ref 0.0–0.1)
Basophils Relative: 0 % (ref 0–1)
Eosinophils Absolute: 0.2 10*3/uL (ref 0.0–0.7)
Eosinophils Relative: 5 % (ref 0–5)
HCT: 29.2 % — ABNORMAL LOW (ref 39.0–52.0)
HEMOGLOBIN: 9.6 g/dL — AB (ref 13.0–17.0)
Lymphocytes Relative: 11 % — ABNORMAL LOW (ref 12–46)
Lymphs Abs: 0.5 10*3/uL — ABNORMAL LOW (ref 0.7–4.0)
MCH: 33.4 pg (ref 26.0–34.0)
MCHC: 32.9 g/dL (ref 30.0–36.0)
MCV: 101.7 fL — ABNORMAL HIGH (ref 78.0–100.0)
MONO ABS: 0.3 10*3/uL (ref 0.1–1.0)
MONOS PCT: 8 % (ref 3–12)
NEUTROS ABS: 3.1 10*3/uL (ref 1.7–7.7)
NEUTROS PCT: 76 % (ref 43–77)
PLATELETS: 58 10*3/uL — AB (ref 150–400)
RBC: 2.87 MIL/uL — AB (ref 4.22–5.81)
RDW: 15.5 % (ref 11.5–15.5)
WBC: 4.1 10*3/uL (ref 4.0–10.5)

## 2014-08-11 MED ORDER — HEPARIN SOD (PORK) LOCK FLUSH 100 UNIT/ML IV SOLN
500.0000 [IU] | Freq: Once | INTRAVENOUS | Status: AC | PRN
  Administered 2014-08-11: 500 [IU]
  Filled 2014-08-11: qty 5

## 2014-08-11 MED ORDER — SODIUM CHLORIDE 0.9 % IJ SOLN
10.0000 mL | INTRAMUSCULAR | Status: DC | PRN
Start: 2014-08-11 — End: 2014-08-11
  Administered 2014-08-11: 10 mL
  Filled 2014-08-11: qty 10

## 2014-08-11 MED ORDER — CYANOCOBALAMIN 1000 MCG/ML IJ SOLN
1000.0000 ug | Freq: Once | INTRAMUSCULAR | Status: AC
  Administered 2014-08-11: 1000 ug via INTRAMUSCULAR
  Filled 2014-08-11: qty 1

## 2014-08-11 MED ORDER — DIPHENHYDRAMINE HCL 50 MG/ML IJ SOLN
50.0000 mg | Freq: Once | INTRAMUSCULAR | Status: AC
  Administered 2014-08-11: 50 mg via INTRAVENOUS
  Filled 2014-08-11: qty 1

## 2014-08-11 MED ORDER — CETUXIMAB CHEMO IV INJECTION 200 MG/100ML
200.0000 mg/m2 | Freq: Once | INTRAVENOUS | Status: AC
  Administered 2014-08-11: 400 mg via INTRAVENOUS
  Filled 2014-08-11: qty 200

## 2014-08-11 MED ORDER — SODIUM CHLORIDE 0.9 % IV SOLN
Freq: Once | INTRAVENOUS | Status: AC
  Administered 2014-08-11: 10:00:00 via INTRAVENOUS

## 2014-08-11 NOTE — Assessment & Plan Note (Signed)
Right apex of lung nodule (7.5 mm) and left upper lobe lesion (9 mm), concerning for malignancy treated with SBRT by Dr. [name] on 2/23, 2/25, 3/3, and 08/02/2013.

## 2014-08-11 NOTE — Assessment & Plan Note (Addendum)
Secondary to cirrhosis of liver and its complications.  Anemia is progressive.  If persists, will need to consider bone marrow aspiration and biopsy.

## 2014-08-11 NOTE — Progress Notes (Signed)
[name] presents today for injection per MD orders. B12 1000 mcg administered IM in right Upper Arm. Administration without incident. Patient tolerated well.  Tolerated chemo well.

## 2014-08-11 NOTE — Progress Notes (Signed)
[name] Kilts, MD [place] [address]  Stage IV Rectal Cancer Pancytopenia  CURRENT THERAPY: Erbitux single-agent beginning on (03/17/14)   INTERVAL HISTORY: [name] 64 y.o. 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 He has a chronic pancytopenia. He has no major complaints today. He has recently injured his left upper extremity (broken clavicle) and has been limited somewhat in his ADLs because of that. He is here for Erbitux today and feels he tolerates the treatment without difficulty.    Adenocarcinoma of rectum   07/07/2010 Initial Diagnosis Adenocarcinoma of rectum   02/24/2014 Progression CT CAP-  Findings within the chest are worrisome for recurrent tumor with bilateral pulmonary metastasis.   03/17/2014 - 04/21/2014 Chemotherapy Erbitux single agent   04/28/2014 - 05/18/2014 Hospital Admission Hematuria,  AKI (acute kidney injury),  Obstructive uropathy,  Anemia,  Metabolic acidosis. S/p left PCN. Removed 12/20 by GU. Received 20 units of PRBCs and 11 units of platelets during this admission   06/09/2014 -  Chemotherapy Re-load with single-agent Erbitux   07/07/2014 Adverse Reaction Mild skin fissures of hands.  Secondary to Cetuximab.  Will treat supportively.     Past Medical History  Diagnosis Date  . Pancytopenia, acquired 12/04/2010  . B12 deficiency 12/07/2010  . Kidney stones   . CAD (coronary artery disease)   . Anemia   . History of radiation therapy 06/19/07 -07/27/07    whole pelvis  . Hx of radiation therapy 08/06/12,08/09/12,& 08/13/12    rul 54GY/9f  . Cancer     Colon ca dx 2008 surg/rad/chemo  . Lung cancer   . FH: chemotherapy   . Myocardial infarction   . Rectal bleeding 09/20/2013    From recurrent rectal mass/recurrent rectal adenocarcinoma  . Acute blood loss anemia 09/27/2013  . Adenocarcinoma of rectum 07/07/2010    Qualifier: History of  By: JRonnald RampFNP-BC,  Kandice L  Initially presented with Stage III adeno of colon.  2.7 cm primary, moderately differentiated with 3/8 positive lymph nodes without LVI.  Surgery was on 04/22/2007 and was treated postoperatively with radiation and concomitant capecitabine and then 4 cycles of a planned 6 with oxaliplatin and capecitabine.  His counts would not allow treatment of  . Hydronephrosis of left kidney 09/24/2013    Kidney stones; s/p cystoscopy and stent  . Gross hematuria 09/25/2103  . Lung metastases 07/09/2013  . Secondary malignancy of right lung 07/09/2013  . Insomnia 06/09/2014  . Foley catheter in place 07/02/14  . Broken collarbone feb 2016    has DIABETES MELLITUS, TYPE II; HYPERCHOLESTEROLEMIA; Deficiency anemia; CORONARY ARTERY DISEASE; SLEEP APNEA; Adenocarcinoma of rectum; Pancytopenia, acquired; B12 deficiency; Port catheter in place; Hx of radiation therapy; Thrombocytopenia; Secondary malignancy of right lung; Rectal bleeding; ARF (acute renal failure); Hydronephrosis; Hydronephrosis of left kidney; GI bleeding; Acute blood loss anemia; Urinary retention; Gross hematuria; AKI (acute kidney injury); Anemia; Hyperkalemia; Nephrolithiasis; and Insomnia on his problem list.     is allergic to daypro and xeloda.  Mr. MRedddoes not currently have medications on file.  Past Surgical History  Procedure Laterality Date  . Ileostomy  12/07/2010    Procedure: ILEOSTOMY;  Surgeon: BDonato Heinz  Location: [facility];  Service: General;  Laterality: N/A;  Diverting Ileostomy Lysis of adhesions Exploratory Laparotomy  . Lung biopsy  10/27/10    rt lobe- adenocarcinoma  . Cholecystectomy    . Cataract extraction  w/ intraocular lens implant  2007    bil  . Lithotripsy  2002  . Coronary angioplasty with stent placement  2003    stenting x 2  . Colon resection  2008    x2  . Esophagogastroduodenoscopy N/A 05/02/2013    Dr. Gala Romney- polypoid antral fold, bx= reactive gastropathy  . Givens capsule study N/A  05/02/2013    Procedure: GIVENS CAPSULE STUDY;  Surgeon: Daneil Dolin, MD;  Location: [facility] SUITE;  Service: Endoscopy;  Laterality: N/A;  . Colonoscopy N/A 09/20/2013    SLF:A near circumferential fungating mass with friable surfaces was found in the rectum.  ACTIVELY OOZING.  CLOTS SEEN IN LUMEN AND ASPIRATED.  Multiple biopsies were performed using cold forceps.  Thermal therapy(BICAP 7 Fr 25W) was used to ATTEMPT TO control bleeding.    HEMOSTASIS NOT ACHEIVED.  Marland Kitchen Cystoscopy w/ ureteral stent placement Left 09/24/2013    Procedure: CYSTOSCOPY WITH LEFT RETROGRADE PYELOGRAM; LEFT URETERAL STENT PLACEMENT; REMOVAL OF SMALL BLADDER CALCULI;  Surgeon: Marissa Nestle, MD;  Location: [facility];  Service: Urology;  Laterality: Left;  . Flexible sigmoidoscopy N/A 09/25/2013    YYQ:MGNOIBBCW exophytic rectal mass most likely representing recurrent adenocarcinoma status post biopsy  . Cystoscopy with retrograde pyelogram, ureteroscopy and stent placement Left 05/08/2014    Procedure: CYSTOSCOPY/ EVACUATION OF CLOTS  WITH LEFT RETROGRADE PYELOGRAM,LEFT URETEROSCOPY ;  Surgeon: Malka So, MD;  Location: [facility];  Service: Urology;  Laterality: Left;  . Nephrolithotomy Left 05/13/2014    Procedure: LEFT NEPHROLITHOTOMY PERCUTANEOUS FIRST STAGE;  Surgeon: Malka So, MD;  Location: [facility];  Service: Urology;  Laterality: Left;  . Holmium laser application Left 88/89/1694    Procedure: HOLMIUM LASER APPLICATION;  Surgeon: Malka So, MD;  Location: [facility];  Service: Urology;  Laterality: Left;    Denies any headaches, dizziness, double vision, fevers, chills, night sweats, nausea, vomiting, diarrhea, constipation, chest pain, heart palpitations, shortness of breath, blood in stool, black tarry stool, urinary pain, urinary burning, urinary frequency, hematuria.   PHYSICAL EXAMINATION  ECOG PERFORMANCE STATUS: 1 - Symptomatic but completely ambulatory  Filed Vitals:   07/21/14 1006  BP: 121/61    Pulse: 68  Temp: 98.1 F (36.7 C)  Resp: 18    GENERAL:alert, no distress, well nourished, well developed, comfortable, cooperative and smiling SKIN: skin fissures of fingertips. HEAD: Normocephalic, No masses, lesions, tenderness or abnormalities EYES: normal, PERRLA, EOMI, Conjunctiva are pink and non-injected EARS: External ears normal OROPHARYNX:lips, buccal mucosa, and tongue normal and mucous membranes are moist  NECK: supple, no adenopathy, thyroid normal size, non-tender, without nodularity, no stridor, non-tender, trachea midline LYMPH:  no palpable lymphadenopathy LUNGS: clear to auscultation and percussion HEART: regular rate & rhythm, no murmurs, no gallops, S1 normal and S2 normal ABDOMEN:abdomen soft, non-tender, normal bowel sounds and no masses or organomegaly BACK: Back symmetric, no curvature. EXTREMITIES:less then 2 second capillary refill, no joint deformities, effusion, or inflammation, no skin discoloration, no cyanosis.  See skin exam above. NEURO: alert & oriented x 3 with fluent speech, no focal motor/sensory deficits, gait normal   LABORATORY DATA: CBC with Differential  Status: Finalresult Visible to patient:  Not Released Nextappt: 08/18/2014 at 10:15 AM in Oncology ([facility] Team A) Dx:  Adenocarcinoma of rectum             Newer results are available. Click to view them now.        Ref Range 3wk ago    WBC  4.0 - 10.5 K/uL 3.5 (L)   RBC 4.22 - 5.81 MIL/uL 2.92 (L)   Hemoglobin 13.0 - 17.0 g/dL 9.4 (L)   HCT 39.0 - 52.0 % 29.6 (L)   MCV 78.0 - 100.0 fL 101.4 (H)   MCH 26.0 - 34.0 pg 32.2   MCHC 30.0 - 36.0 g/dL 31.8   RDW 11.5 - 15.5 % 17.3 (H)   Platelets 150 - 400 K/uL 48 (L)   Comments: SPECIMEN CHECKED FOR CLOTS  PLATELET COUNT CONFIRMED BY SMEAR     Neutrophils Relative % 43 - 77 % 72   Neutro Abs 1.7 - 7.7 K/uL 2.5   Lymphocytes Relative 12 - 46 % 12   Lymphs Abs 0.7 - 4.0 K/uL 0.4 (L)   Monocytes Relative 3  - 12 % 10   Monocytes Absolute 0.1 - 1.0 K/uL 0.3   Eosinophils Relative 0 - 5 % 6 (H)   Eosinophils Absolute 0.0 - 0.7 K/uL 0.2   Basophils Relative 0 - 1 % 0   Basophils Absolute 0.0 - 0.1 K/uL 0.0   Resulting Agency [facility]       Specimen Collected: 07/21/14 10:52 AM Last Resulted: 07/21/14 11:30 AM         Comprehensive metabolic panel  Status: Finalresult Visible to patient:  Not Released Nextappt: 08/18/2014 at 10:15 AM in Oncology ([facility]) Dx:  Adenocarcinoma of rectum             Newer results are available. Click to view them now.        Ref Range 3wk ago    Sodium 135 - 145 mmol/L 138   Potassium 3.5 - 5.1 mmol/L 3.8   Chloride 96 - 112 mmol/L 117 (H)   CO2 19 - 32 mmol/L 21   Glucose, Bld 70 - 99 mg/dL 91   BUN 6 - 23 mg/dL 14   Creatinine, Ser 0.50 - 1.35 mg/dL 1.46 (H)   Calcium 8.4 - 10.5 mg/dL 8.1 (L)   Total Protein 6.0 - 8.3 g/dL 5.9 (L)   Albumin 3.5 - 5.2 g/dL 2.6 (L)   AST 0 - 37 U/L 29   ALT 0 - 53 U/L 17   Alkaline Phosphatase 39 - 117 U/L 101   Total Bilirubin 0.3 - 1.2 mg/dL 1.4 (H)   GFR calc non Af Amer >90 mL/min 50 (L)   GFR calc Af Amer >90 mL/min 58 (L)   Comments: (NOTE)  The eGFR has been calculated using the CKD EPI equation.  This calculation has not been validated in all clinical situations.  eGFR's persistently <90 mL/min signify possible Chronic Kidney  Disease.     Anion gap 5 - 15  NOT CALCULATED   Resulting Agency [facility]       Specimen Collected: 07/21/14 10:52 AM Last Resulted: 07/21/14 11:18 AM                     ASSESSMENT AND PLAN:   Stage IV rectal cancer  64 year old male with stage IV rectal cancer currently on single agent Erbitux. He tolerates treatment without difficulty. Last imaging studies were about 6 months ago. His studies have not been repeated as it is felt his CEA is reasonably helpful in following his disease, in addition his performance status is  good. Major limiting factor is his broken clavicle. But he states other than that he is able to do what he wants. Continue forward with current therapy making no changes. We  will see him back prior to his next cycle of Erbitux.  Pancytopenia  Certainly multifactorial. We will continue with ongoing observation but we may need a more aggressive evaluation moving forward.  Continue therapy as planned. Follow-up with urology as directed.  All questions were answered. The patient knows to call the clinic with any problems, questions or concerns. We can certainly see the patient much sooner if necessary.  This note was electronically signed Molli Hazard 08/11/2014

## 2014-08-11 NOTE — Assessment & Plan Note (Signed)
On B12 injections every month.  Next injection due today, 08/11/2014.

## 2014-08-11 NOTE — Assessment & Plan Note (Addendum)
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 Unfortunately, he was admitted to the hospital for an extended stay secondary to AKI resulting in holding of therapy.  Restarted on 06/09/2014 with loading dose.  CEA was noted to increase during this time off of therapy, but now is declining.   Treatment today as planned.  Return in 2 weeks for follow-up as planned.  Prechemo labs as ordered.

## 2014-08-11 NOTE — Progress Notes (Signed)
[name] Kilts, MD [place] [address]  Adenocarcinoma of rectum  B12 deficiency  Pancytopenia, acquired - Plan: Vitamin B12, Folate, Iron and TIBC, Ferritin  Secondary malignancy of right lung  CURRENT THERAPY: Erbitux single-agent beginning on (03/17/14)   INTERVAL HISTORY: [name] 64 y.o. 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     Adenocarcinoma of rectum   07/07/2010 Initial Diagnosis Adenocarcinoma of rectum   02/24/2014 Progression CT CAP-  Findings within the chest are worrisome for recurrent tumor with bilateral pulmonary metastasis.   03/17/2014 - 04/21/2014 Chemotherapy Erbitux single agent   04/28/2014 - 05/18/2014 Hospital Admission Hematuria,  AKI (acute kidney injury),  Obstructive uropathy,  Anemia,  Metabolic acidosis. S/p left PCN. Removed 12/20 by GU. Received 20 units of PRBCs and 11 units of platelets during this admission   06/09/2014 -  Chemotherapy Re-load with single-agent Erbitux   07/07/2014 Adverse Reaction Mild skin fissures of hands.  Secondary to Cetuximab.  Will treat supportively.    I personally reviewed and went over laboratory results with the patient.  The results are noted within this dictation.  Chart reviewed.  Recently diagnosed with distal clavicular fracture, type II.  Under treatment by Dr. Aline Brochure (Ortho).   He is out of his sling and he was given exercises to work on mobility.  He has continued follow-up with Urology as directed for bladder issues.  He reports that "my bladder still isn't working."  It sounds like Dr. Jeffie Pollock is discussing self-catheterization with [name] at home.  He continues to have a Foley in place.   He feels great.  He worked in the yard over the weekend.  Of concern is his continued anemia despite significant improvement in hematuria.  We will check labs today.  I predict the need for a bone marrow aspiration and  biopsy in the future.  Oncologically, he denies any complaints and ROS questioning is negative.   Past Medical History  Diagnosis Date  . Pancytopenia, acquired 12/04/2010  . B12 deficiency 12/07/2010  . Kidney stones   . CAD (coronary artery disease)   . Anemia   . History of radiation therapy 06/19/07 -07/27/07    whole pelvis  . Hx of radiation therapy 08/06/12,08/09/12,& 08/13/12    rul 54GY/74f  . Cancer     Colon ca dx 2008 surg/rad/chemo  . Lung cancer   . FH: chemotherapy   . Myocardial infarction   . Rectal bleeding 09/20/2013    From recurrent rectal mass/recurrent rectal adenocarcinoma  . Acute blood loss anemia 09/27/2013  . 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  2.7 cm primary, moderately differentiated with 3/8 positive lymph nodes without LVI.  Surgery was on 04/22/2007 and was treated postoperatively with radiation and concomitant capecitabine and then 4 cycles of a planned 6 with oxaliplatin and capecitabine.  His counts would not allow treatment of  . Hydronephrosis of left kidney 09/24/2013    Kidney stones; s/p cystoscopy and stent  . Gross hematuria 09/25/2103  . Lung metastases 07/09/2013  . Secondary malignancy of right lung 07/09/2013  . Insomnia 06/09/2014  . Foley catheter in place 07/02/14  . Broken collarbone feb 2016    has DIABETES MELLITUS, TYPE II; HYPERCHOLESTEROLEMIA; Deficiency anemia; CORONARY ARTERY DISEASE; SLEEP APNEA; Adenocarcinoma of rectum; Pancytopenia, acquired; B12  deficiency; Port catheter in place; Hx of radiation therapy; Thrombocytopenia; Secondary malignancy of right lung; Rectal bleeding; ARF (acute renal failure); Hydronephrosis; Hydronephrosis of left kidney; GI bleeding; Acute blood loss anemia; Urinary retention; Gross hematuria; AKI (acute kidney injury); Anemia; Hyperkalemia; Nephrolithiasis; and Insomnia on his problem list.     is  allergic to daypro and xeloda.  [name] had no medications administered during this visit.  Past Surgical History  Procedure Laterality Date  . Ileostomy  12/07/2010    Procedure: ILEOSTOMY;  Surgeon: Donato Heinz;  Location: [facility];  Service: General;  Laterality: N/A;  Diverting Ileostomy Lysis of adhesions Exploratory Laparotomy  . Lung biopsy  10/27/10    rt lobe- adenocarcinoma  . Cholecystectomy    . Cataract extraction w/ intraocular lens implant  2007    bil  . Lithotripsy  2002  . Coronary angioplasty with stent placement  2003    stenting x 2  . Colon resection  2008    x2  . Esophagogastroduodenoscopy N/A 05/02/2013    Dr. Gala Romney- polypoid antral fold, bx= reactive gastropathy  . Givens capsule study N/A 05/02/2013    Procedure: GIVENS CAPSULE STUDY;  Surgeon: Daneil Dolin, MD;  Location: [facility];  Service: Endoscopy;  Laterality: N/A;  . Colonoscopy N/A 09/20/2013    SLF:A near circumferential fungating mass with friable surfaces was found in the rectum.  ACTIVELY OOZING.  CLOTS SEEN IN LUMEN AND ASPIRATED.  Multiple biopsies were performed using cold forceps.  Thermal therapy(BICAP 7 Fr 25W) was used to ATTEMPT TO control bleeding.    HEMOSTASIS NOT ACHEIVED.  Marland Kitchen Cystoscopy w/ ureteral stent placement Left 09/24/2013    Procedure: CYSTOSCOPY WITH LEFT RETROGRADE PYELOGRAM; LEFT URETERAL STENT PLACEMENT; REMOVAL OF SMALL BLADDER CALCULI;  Surgeon: Marissa Nestle, MD;  Location: [facility];  Service: Urology;  Laterality: Left;  . Flexible sigmoidoscopy N/A 09/25/2013    DDU:[id number] exophytic rectal mass most likely representing recurrent adenocarcinoma status post biopsy  . Cystoscopy with retrograde pyelogram, ureteroscopy and stent placement Left 05/08/2014    Procedure: CYSTOSCOPY/ EVACUATION OF CLOTS  WITH LEFT RETROGRADE PYELOGRAM,LEFT URETEROSCOPY ;  Surgeon: Malka So, MD;  Location: [facility];  Service: Urology;  Laterality: Left;  . Nephrolithotomy Left  05/13/2014    Procedure: LEFT NEPHROLITHOTOMY PERCUTANEOUS FIRST STAGE;  Surgeon: Malka So, MD;  Location: [facility];  Service: Urology;  Laterality: Left;  . Holmium laser application Left 23/76/2831    Procedure: HOLMIUM LASER APPLICATION;  Surgeon: Malka So, MD;  Location: [facility];  Service: Urology;  Laterality: Left;    Denies any headaches, dizziness, double vision, fevers, chills, night sweats, nausea, vomiting, diarrhea, constipation, chest pain, heart palpitations, shortness of breath, blood in stool, black tarry stool, urinary pain, urinary burning, urinary frequency, hematuria.   PHYSICAL EXAMINATION  ECOG PERFORMANCE STATUS: 0 - Asymptomatic  Filed Vitals:   08/11/14 0931  BP: 119/66  Pulse: 66  Temp: 98.2 F (36.8 C)  Resp: 18    GENERAL:alert, no distress, well nourished, well developed, comfortable, cooperative and smiling SKIN: skin color, texture, turgor are normal, no rashes or significant lesions HEAD: Normocephalic, No masses, lesions, tenderness or abnormalities EYES: normal, PERRLA, EOMI, Conjunctiva are pink and non-injected EARS: External ears normal OROPHARYNX:lips, buccal mucosa, and tongue normal and mucous membranes are moist  NECK: supple, no adenopathy, no stridor, non-tender, trachea midline LYMPH:  no palpable lymphadenopathy BREAST:not examined LUNGS: clear to auscultation  HEART: regular rate & rhythm,  no murmurs and no gallops ABDOMEN:abdomen soft, non-tender and normal bowel sounds BACK: Back symmetric, no curvature., No CVA tenderness EXTREMITIES:less then 2 second capillary refill, no joint deformities, effusion, or inflammation, no edema, no skin discoloration, no clubbing, no cyanosis  NEURO: alert & oriented x 3 with fluent speech, no focal motor/sensory deficits, gait normal   LABORATORY DATA: CBC    Component Value Date/Time   WBC 2.9* 08/04/2014 1056   RBC 2.64* 08/04/2014 1056   HGB 8.8* 08/04/2014 1056   HCT 27.0*  08/04/2014 1056   PLT 49* 08/04/2014 1056   MCV 102.3* 08/04/2014 1056   MCH 33.3 08/04/2014 1056   MCHC 32.6 08/04/2014 1056   RDW 15.8* 08/04/2014 1056   LYMPHSABS 0.4* 08/04/2014 1056   MONOABS 0.3 08/04/2014 1056   EOSABS 0.1 08/04/2014 1056   BASOSABS 0.0 08/04/2014 1056      Chemistry      Component Value Date/Time   NA 142 08/04/2014 1056   K 3.7 08/04/2014 1056   CL 115* 08/04/2014 1056   CO2 22 08/04/2014 1056   BUN 14 08/04/2014 1056   CREATININE 1.51* 08/04/2014 1056      Component Value Date/Time   CALCIUM 8.4 08/04/2014 1056   ALKPHOS 108 08/04/2014 1056   AST 36 08/04/2014 1056   ALT 20 08/04/2014 1056   BILITOT 0.8 08/04/2014 1056       RADIOGRAPHIC STUDIES:  Dg Shoulder Left  08/06/2014   2 views of the left shoulder status post distal clavicle fracture  involving the coracoclavicular ligaments  This is a type II distal clavicle fracture.  The fracture remains displaced superiorly with no signs of callus  formation.  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 Unfortunately, he was admitted to the hospital for an extended stay secondary to AKI resulting in holding of therapy.  Restarted on 06/09/2014 with loading dose.  CEA was noted to increase during this time off of therapy, but now is declining.   Treatment today as planned.  Return in 2 weeks for follow-up as planned.  Prechemo labs as ordered.   B12 deficiency On B12 injections every month.  Next injection due today, 08/11/2014.    Pancytopenia, acquired Secondary to cirrhosis of liver and its complications.  Anemia is progressive.  If persists, will need to consider bone marrow aspiration and biopsy.   Secondary  malignancy of right lung Right apex of lung nodule (7.5 mm) and left upper lobe lesion (9 mm), concerning for malignancy treated with SBRT by Dr. [name] on 2/23, 2/25, 3/3, and 08/02/2013.     THERAPY PLAN:  Continue therapy as planned. Follow-up with urology and ortho as directed.  All questions were answered. The patient knows to call the clinic with any problems, questions or concerns. We can certainly see the patient much sooner if necessary.  Patient and plan discussed with Dr. Ancil Linsey and she is in agreement with the aforementioned.   This note is electronically signed by: Robynn Pane 08/11/2014 10:10 AM

## 2014-08-11 NOTE — Patient Instructions (Signed)
[facility] Discharge Instructions for Patients Receiving Chemotherapy  Today you received the following chemotherapy agents weekly Erbitux. Continue weekly Erbitux.  Continue monthly B12 injections. MD appointment again in 4 weeks.  To help prevent nausea and vomiting after your treatment, we encourage you to take your nausea medication as instructed. If you develop nausea and vomiting that is not controlled by your nausea medication, call the clinic. If it is after clinic hours your family physician or the after hours number for the clinic or go to the Emergency Department. BELOW ARE SYMPTOMS THAT SHOULD BE REPORTED IMMEDIATELY:  *FEVER GREATER THAN 101.0 F  *CHILLS WITH OR WITHOUT FEVER  NAUSEA AND VOMITING THAT IS NOT CONTROLLED WITH YOUR NAUSEA MEDICATION  *UNUSUAL SHORTNESS OF BREATH  *UNUSUAL BRUISING OR BLEEDING  TENDERNESS IN MOUTH AND THROAT WITH OR WITHOUT PRESENCE OF ULCERS  *URINARY PROBLEMS  *BOWEL PROBLEMS  UNUSUAL RASH Items with * indicate a potential emergency and should be followed up as soon as possible.  Return as scheduled.  I have been informed and understand all the instructions given to me. I know to contact the clinic, my physician, or go to the Emergency Department if any problems should occur. I do not have any questions at this time, but understand that I may call the clinic during office hours or the Patient Navigator at [phone] should I have any questions or need assistance in obtaining follow up care.    __________________________________________  _____________  __________ Signature of Patient or Authorized Representative            Date                   Time    __________________________________________ Nurse's Signature

## 2014-08-12 LAB — IRON AND TIBC
IRON: 75 ug/dL (ref 42–165)
Saturation Ratios: 33 % (ref 20–55)
TIBC: 224 ug/dL (ref 215–435)
UIBC: 149 ug/dL (ref 125–400)

## 2014-08-12 LAB — FERRITIN: Ferritin: 659 ng/mL — ABNORMAL HIGH (ref 22–322)

## 2014-08-12 LAB — VITAMIN B12: Vitamin B-12: >2000 pg/mL — ABNORMAL HIGH (ref 211–911)

## 2014-08-12 LAB — FOLATE: Folate: >20 ng/mL

## 2014-08-18 ENCOUNTER — Encounter (HOSPITAL_BASED_OUTPATIENT_CLINIC_OR_DEPARTMENT_OTHER): Payer: Managed Care, Other (non HMO)

## 2014-08-18 ENCOUNTER — Other Ambulatory Visit (HOSPITAL_COMMUNITY): Payer: Self-pay | Admitting: Oncology

## 2014-08-18 DIAGNOSIS — C20 Malignant neoplasm of rectum: Secondary | ICD-10-CM

## 2014-08-18 DIAGNOSIS — C787 Secondary malignant neoplasm of liver and intrahepatic bile duct: Secondary | ICD-10-CM

## 2014-08-18 DIAGNOSIS — Z5112 Encounter for antineoplastic immunotherapy: Secondary | ICD-10-CM | POA: Diagnosis not present

## 2014-08-18 DIAGNOSIS — E538 Deficiency of other specified B group vitamins: Secondary | ICD-10-CM | POA: Diagnosis not present

## 2014-08-18 LAB — CBC WITH DIFFERENTIAL/PLATELET
Basophils Absolute: 0 10*3/uL (ref 0.0–0.1)
Basophils Relative: 0 % (ref 0–1)
EOS PCT: 4 % (ref 0–5)
Eosinophils Absolute: 0.2 10*3/uL (ref 0.0–0.7)
HCT: 28.7 % — ABNORMAL LOW (ref 39.0–52.0)
HEMOGLOBIN: 9.4 g/dL — AB (ref 13.0–17.0)
LYMPHS ABS: 0.5 10*3/uL — AB (ref 0.7–4.0)
Lymphocytes Relative: 12 % (ref 12–46)
MCH: 33.3 pg (ref 26.0–34.0)
MCHC: 32.8 g/dL (ref 30.0–36.0)
MCV: 101.8 fL — AB (ref 78.0–100.0)
MONO ABS: 0.3 10*3/uL (ref 0.1–1.0)
MONOS PCT: 9 % (ref 3–12)
NEUTROS ABS: 3 10*3/uL (ref 1.7–7.7)
Neutrophils Relative %: 76 % (ref 43–77)
Platelets: 50 10*3/uL — ABNORMAL LOW (ref 150–400)
RBC: 2.82 MIL/uL — ABNORMAL LOW (ref 4.22–5.81)
RDW: 14.7 % (ref 11.5–15.5)
WBC: 4 10*3/uL (ref 4.0–10.5)

## 2014-08-18 LAB — COMPREHENSIVE METABOLIC PANEL
ALBUMIN: 2.9 g/dL — AB (ref 3.5–5.2)
ALK PHOS: 119 U/L — AB (ref 39–117)
ALT: 37 U/L (ref 0–53)
AST: 50 U/L — AB (ref 0–37)
Anion gap: 6 (ref 5–15)
BILIRUBIN TOTAL: 0.8 mg/dL (ref 0.3–1.2)
BUN: 15 mg/dL (ref 6–23)
CHLORIDE: 115 mmol/L — AB (ref 96–112)
CO2: 18 mmol/L — ABNORMAL LOW (ref 19–32)
Calcium: 8.2 mg/dL — ABNORMAL LOW (ref 8.4–10.5)
Creatinine, Ser: 1.6 mg/dL — ABNORMAL HIGH (ref 0.50–1.35)
GFR calc Af Amer: 52 mL/min — ABNORMAL LOW (ref >90)
GFR calc non Af Amer: 45 mL/min — ABNORMAL LOW (ref >90)
Glucose, Bld: 120 mg/dL — ABNORMAL HIGH (ref 70–99)
POTASSIUM: 3.8 mmol/L (ref 3.5–5.1)
Sodium: 139 mmol/L (ref 135–145)
Total Protein: 6.4 g/dL (ref 6.0–8.3)

## 2014-08-18 LAB — MAGNESIUM: Magnesium: 1.3 mg/dL — ABNORMAL LOW (ref 1.5–2.5)

## 2014-08-18 MED ORDER — SODIUM CHLORIDE 0.9 % IJ SOLN
10.0000 mL | INTRAMUSCULAR | Status: DC | PRN
  Administered 2014-08-18: 10 mL
  Filled 2014-08-18: qty 10

## 2014-08-18 MED ORDER — MAGNESIUM CHLORIDE 64 MG PO TBEC: 2.0000 | DELAYED_RELEASE_TABLET | Freq: Every day | ORAL | Status: DC

## 2014-08-18 MED ORDER — HEPARIN SOD (PORK) LOCK FLUSH 100 UNIT/ML IV SOLN
500.0000 [IU] | Freq: Once | INTRAVENOUS | Status: AC | PRN
  Administered 2014-08-18: 500 [IU]
  Filled 2014-08-18: qty 5

## 2014-08-18 MED ORDER — DIPHENHYDRAMINE HCL 50 MG/ML IJ SOLN
50.0000 mg | Freq: Once | INTRAMUSCULAR | Status: AC
  Administered 2014-08-18: 50 mg via INTRAVENOUS
  Filled 2014-08-18: qty 1

## 2014-08-18 MED ORDER — SODIUM CHLORIDE 0.9 % IV SOLN
Freq: Once | INTRAVENOUS | Status: AC
  Administered 2014-08-18: 10:00:00 via INTRAVENOUS

## 2014-08-18 MED ORDER — CETUXIMAB CHEMO IV INJECTION 200 MG/100ML
200.0000 mg/m2 | Freq: Once | INTRAVENOUS | Status: AC
  Administered 2014-08-18: 400 mg via INTRAVENOUS
  Filled 2014-08-18: qty 200

## 2014-08-18 NOTE — Patient Instructions (Signed)
[facility] Discharge Instructions for Patients Receiving Chemotherapy  Today you received the following chemotherapy agents: Erbitux   To help prevent nausea and vomiting after your treatment, we encourage you to take your nausea medication:  Zofran 8mg  every 8 hours as needed for nausea/vomiting.   If you develop nausea and vomiting, or diarrhea that is not controlled by your medication, call the clinic.  The clinic phone number is [phone]. Office hours are Monday-Friday 8:30am-5:00pm.  BELOW ARE SYMPTOMS THAT SHOULD BE REPORTED IMMEDIATELY:  *FEVER GREATER THAN 101.0 F  *CHILLS WITH OR WITHOUT FEVER  NAUSEA AND VOMITING THAT IS NOT CONTROLLED WITH YOUR NAUSEA MEDICATION  *UNUSUAL SHORTNESS OF BREATH  *UNUSUAL BRUISING OR BLEEDING  TENDERNESS IN MOUTH AND THROAT WITH OR WITHOUT PRESENCE OF ULCERS  *URINARY PROBLEMS  *BOWEL PROBLEMS  UNUSUAL RASH Items with * indicate a potential emergency and should be followed up as soon as possible. If you have an emergency after office hours please contact your primary care physician or go to the nearest emergency department.  Please call the clinic during office hours if you have any questions or concerns.   You may also contact the Patient Navigator at [phone] should you have any questions or need assistance in obtaining follow up care. _____________________________________________________________________ Have you asked about our STAR program?    STAR stands for Survivorship Training and Rehabilitation, and this is a nationally recognized cancer care program that focuses on survivorship and rehabilitation.  Cancer and cancer treatments may cause problems, such as, pain, making you feel tired and keeping you from doing the things that you need or want to do. Cancer rehabilitation can help. Our goal is to reduce these troubling effects and help you have the best quality of life possible.  You may receive a  survey from a nurse that asks questions about your current state of health.  Based on the survey results, all eligible patients will be referred to the [facility] program for an evaluation so we can better serve you! A frequently asked questions sheet is available upon request.

## 2014-08-18 NOTE — Progress Notes (Signed)
[name] Tolerated chemotherapy well today.  Discharged ambulatory

## 2014-08-19 LAB — CEA: CEA: 9.7 ng/mL — AB (ref 0.0–4.7)

## 2014-08-25 ENCOUNTER — Encounter (HOSPITAL_BASED_OUTPATIENT_CLINIC_OR_DEPARTMENT_OTHER): Payer: Managed Care, Other (non HMO)

## 2014-08-25 ENCOUNTER — Encounter (HOSPITAL_COMMUNITY): Payer: Self-pay

## 2014-08-25 DIAGNOSIS — C20 Malignant neoplasm of rectum: Secondary | ICD-10-CM | POA: Diagnosis not present

## 2014-08-25 DIAGNOSIS — Z5112 Encounter for antineoplastic immunotherapy: Secondary | ICD-10-CM | POA: Diagnosis not present

## 2014-08-25 DIAGNOSIS — C7801 Secondary malignant neoplasm of right lung: Secondary | ICD-10-CM | POA: Diagnosis not present

## 2014-08-25 DIAGNOSIS — C787 Secondary malignant neoplasm of liver and intrahepatic bile duct: Secondary | ICD-10-CM | POA: Diagnosis not present

## 2014-08-25 DIAGNOSIS — E538 Deficiency of other specified B group vitamins: Secondary | ICD-10-CM | POA: Diagnosis not present

## 2014-08-25 LAB — MAGNESIUM: MAGNESIUM: 1.4 mg/dL — AB (ref 1.5–2.5)

## 2014-08-25 MED ORDER — DIPHENHYDRAMINE HCL 50 MG/ML IJ SOLN
INTRAMUSCULAR | Status: AC
  Filled 2014-08-25: qty 1

## 2014-08-25 MED ORDER — DIPHENHYDRAMINE HCL 25 MG PO CAPS
ORAL_CAPSULE | ORAL | Status: AC
  Filled 2014-08-25: qty 2

## 2014-08-25 MED ORDER — DIPHENHYDRAMINE HCL 50 MG/ML IJ SOLN
INTRAMUSCULAR | Status: AC
Start: 2014-08-25 — End: 2014-08-25
  Filled 2014-08-25: qty 1

## 2014-08-25 MED ORDER — HEPARIN SOD (PORK) LOCK FLUSH 100 UNIT/ML IV SOLN
500.0000 [IU] | Freq: Once | INTRAVENOUS | Status: AC | PRN
  Administered 2014-08-25: 500 [IU]
  Filled 2014-08-25: qty 5

## 2014-08-25 MED ORDER — CETUXIMAB CHEMO IV INJECTION 200 MG/100ML
200.0000 mg/m2 | Freq: Once | INTRAVENOUS | Status: AC
  Administered 2014-08-25: 400 mg via INTRAVENOUS
  Filled 2014-08-25: qty 200

## 2014-08-25 MED ORDER — SODIUM CHLORIDE 0.9 % IJ SOLN
10.0000 mL | INTRAMUSCULAR | Status: DC | PRN
  Administered 2014-08-25: 10 mL
  Filled 2014-08-25: qty 10

## 2014-08-25 MED ORDER — SODIUM CHLORIDE 0.9 % IV SOLN
Freq: Once | INTRAVENOUS | Status: AC
Start: 2014-08-25 — End: 2014-08-25
  Administered 2014-08-25: 10:00:00 via INTRAVENOUS

## 2014-08-25 MED ORDER — DIPHENHYDRAMINE HCL 50 MG/ML IJ SOLN
50.0000 mg | Freq: Once | INTRAMUSCULAR | Status: AC
  Administered 2014-08-25: 50 mg via INTRAVENOUS

## 2014-08-25 NOTE — Progress Notes (Signed)
Tolerated chemo well. 

## 2014-08-25 NOTE — Patient Instructions (Signed)
[facility] Discharge Instructions for Patients Receiving Chemotherapy  Today you received the following chemotherapy agents weekly Erbitux. To help prevent nausea and vomiting after your treatment, we encourage you to take your nausea medication as instructed. If you develop nausea and vomiting that is not controlled by your nausea medication, call the clinic. If it is after clinic hours your family physician or the after hours number for the clinic or go to the Emergency Department. BELOW ARE SYMPTOMS THAT SHOULD BE REPORTED IMMEDIATELY:  *FEVER GREATER THAN 101.0 F  *CHILLS WITH OR WITHOUT FEVER  NAUSEA AND VOMITING THAT IS NOT CONTROLLED WITH YOUR NAUSEA MEDICATION  *UNUSUAL SHORTNESS OF BREATH  *UNUSUAL BRUISING OR BLEEDING  TENDERNESS IN MOUTH AND THROAT WITH OR WITHOUT PRESENCE OF ULCERS  *URINARY PROBLEMS  *BOWEL PROBLEMS  UNUSUAL RASH Items with * indicate a potential emergency and should be followed up as soon as possible.  Return as scheduled.  I have been informed and understand all the instructions given to me. I know to contact the clinic, my physician, or go to the Emergency Department if any problems should occur. I do not have any questions at this time, but understand that I may call the clinic during office hours or the Patient Navigator at [phone] should I have any questions or need assistance in obtaining follow up care.    __________________________________________  _____________  __________ Signature of Patient or Authorized Representative            Date                   Time    __________________________________________ Nurse's Signature

## 2014-09-01 ENCOUNTER — Encounter (HOSPITAL_COMMUNITY): Payer: Managed Care, Other (non HMO) | Attending: Oncology

## 2014-09-01 ENCOUNTER — Encounter (HOSPITAL_COMMUNITY): Payer: Self-pay | Admitting: Oncology

## 2014-09-01 DIAGNOSIS — D619 Aplastic anemia, unspecified: Secondary | ICD-10-CM | POA: Insufficient documentation

## 2014-09-01 DIAGNOSIS — Z5112 Encounter for antineoplastic immunotherapy: Secondary | ICD-10-CM

## 2014-09-01 DIAGNOSIS — E538 Deficiency of other specified B group vitamins: Secondary | ICD-10-CM | POA: Insufficient documentation

## 2014-09-01 DIAGNOSIS — C7801 Secondary malignant neoplasm of right lung: Secondary | ICD-10-CM | POA: Diagnosis not present

## 2014-09-01 DIAGNOSIS — C787 Secondary malignant neoplasm of liver and intrahepatic bile duct: Secondary | ICD-10-CM | POA: Diagnosis not present

## 2014-09-01 DIAGNOSIS — C20 Malignant neoplasm of rectum: Secondary | ICD-10-CM | POA: Diagnosis not present

## 2014-09-01 LAB — CBC WITH DIFFERENTIAL/PLATELET
Basophils Absolute: 0 10*3/uL (ref 0.0–0.1)
Basophils Relative: 0 % (ref 0–1)
Eosinophils Absolute: 0.2 10*3/uL (ref 0.0–0.7)
Eosinophils Relative: 5 % (ref 0–5)
HCT: 28.7 % — ABNORMAL LOW (ref 39.0–52.0)
Hemoglobin: 9.6 g/dL — ABNORMAL LOW (ref 13.0–17.0)
LYMPHS ABS: 0.5 10*3/uL — AB (ref 0.7–4.0)
LYMPHS PCT: 13 % (ref 12–46)
MCH: 34 pg (ref 26.0–34.0)
MCHC: 33.4 g/dL (ref 30.0–36.0)
MCV: 101.8 fL — AB (ref 78.0–100.0)
Monocytes Absolute: 0.3 10*3/uL (ref 0.1–1.0)
Monocytes Relative: 10 % (ref 3–12)
NEUTROS PCT: 72 % (ref 43–77)
Neutro Abs: 2.6 10*3/uL (ref 1.7–7.7)
Platelets: 47 10*3/uL — ABNORMAL LOW (ref 150–400)
RBC: 2.82 MIL/uL — AB (ref 4.22–5.81)
RDW: 14.3 % (ref 11.5–15.5)
WBC: 3.6 10*3/uL — AB (ref 4.0–10.5)

## 2014-09-01 LAB — COMPREHENSIVE METABOLIC PANEL
ALT: 24 U/L (ref 0–53)
ANION GAP: 4 — AB (ref 5–15)
AST: 32 U/L (ref 0–37)
Albumin: 3.1 g/dL — ABNORMAL LOW (ref 3.5–5.2)
Alkaline Phosphatase: 117 U/L (ref 39–117)
BILIRUBIN TOTAL: 1.3 mg/dL — AB (ref 0.3–1.2)
BUN: 16 mg/dL (ref 6–23)
CHLORIDE: 117 mmol/L — AB (ref 96–112)
CO2: 19 mmol/L (ref 19–32)
CREATININE: 1.53 mg/dL — AB (ref 0.50–1.35)
Calcium: 8.4 mg/dL (ref 8.4–10.5)
GFR calc Af Amer: 55 mL/min — ABNORMAL LOW (ref >90)
GFR calc non Af Amer: 47 mL/min — ABNORMAL LOW (ref >90)
Glucose, Bld: 83 mg/dL (ref 70–99)
POTASSIUM: 4.4 mmol/L (ref 3.5–5.1)
Sodium: 140 mmol/L (ref 135–145)
Total Protein: 6.5 g/dL (ref 6.0–8.3)

## 2014-09-01 LAB — MAGNESIUM: MAGNESIUM: 1.4 mg/dL — AB (ref 1.5–2.5)

## 2014-09-01 MED ORDER — SODIUM CHLORIDE 0.9 % IJ SOLN: 10.0000 mL | INTRAMUSCULAR | Status: DC | PRN

## 2014-09-01 MED ORDER — SODIUM CHLORIDE 0.9 % IV SOLN
Freq: Once | INTRAVENOUS | Status: AC
  Administered 2014-09-01: 11:00:00 via INTRAVENOUS

## 2014-09-01 MED ORDER — HEPARIN SOD (PORK) LOCK FLUSH 100 UNIT/ML IV SOLN
500.0000 [IU] | Freq: Once | INTRAVENOUS | Status: AC | PRN
  Administered 2014-09-01: 500 [IU]

## 2014-09-01 MED ORDER — DIPHENHYDRAMINE HCL 50 MG/ML IJ SOLN
50.0000 mg | Freq: Once | INTRAMUSCULAR | Status: AC
  Administered 2014-09-01: 50 mg via INTRAVENOUS

## 2014-09-01 MED ORDER — DIPHENHYDRAMINE HCL 50 MG/ML IJ SOLN
INTRAMUSCULAR | Status: AC
  Filled 2014-09-01: qty 1

## 2014-09-01 MED ORDER — CETUXIMAB CHEMO IV INJECTION 200 MG/100ML
200.0000 mg/m2 | Freq: Once | INTRAVENOUS | Status: AC
  Administered 2014-09-01: 400 mg via INTRAVENOUS
  Filled 2014-09-01: qty 200

## 2014-09-01 MED ORDER — HEPARIN SOD (PORK) LOCK FLUSH 100 UNIT/ML IV SOLN
INTRAVENOUS | Status: AC
  Filled 2014-09-01: qty 5

## 2014-09-08 ENCOUNTER — Encounter (HOSPITAL_COMMUNITY): Payer: Self-pay | Admitting: Hematology & Oncology

## 2014-09-08 ENCOUNTER — Encounter (HOSPITAL_BASED_OUTPATIENT_CLINIC_OR_DEPARTMENT_OTHER): Payer: Managed Care, Other (non HMO) | Admitting: Hematology & Oncology

## 2014-09-08 ENCOUNTER — Encounter (HOSPITAL_COMMUNITY): Payer: Managed Care, Other (non HMO) | Attending: Hematology & Oncology

## 2014-09-08 ENCOUNTER — Encounter (HOSPITAL_BASED_OUTPATIENT_CLINIC_OR_DEPARTMENT_OTHER): Payer: Managed Care, Other (non HMO)

## 2014-09-08 VITALS — BP 116/69 | HR 71 | Temp 97.8°F | Resp 18 | Wt 205.0 lb

## 2014-09-08 DIAGNOSIS — C7800 Secondary malignant neoplasm of unspecified lung: Principal | ICD-10-CM

## 2014-09-08 DIAGNOSIS — Z5112 Encounter for antineoplastic immunotherapy: Secondary | ICD-10-CM | POA: Diagnosis not present

## 2014-09-08 DIAGNOSIS — E538 Deficiency of other specified B group vitamins: Secondary | ICD-10-CM

## 2014-09-08 DIAGNOSIS — D61818 Other pancytopenia: Secondary | ICD-10-CM

## 2014-09-08 DIAGNOSIS — C20 Malignant neoplasm of rectum: Secondary | ICD-10-CM

## 2014-09-08 DIAGNOSIS — C787 Secondary malignant neoplasm of liver and intrahepatic bile duct: Secondary | ICD-10-CM

## 2014-09-08 DIAGNOSIS — C7801 Secondary malignant neoplasm of right lung: Secondary | ICD-10-CM | POA: Diagnosis not present

## 2014-09-08 MED ORDER — HEPARIN SOD (PORK) LOCK FLUSH 100 UNIT/ML IV SOLN
500.0000 [IU] | Freq: Once | INTRAVENOUS | Status: AC | PRN
  Administered 2014-09-08: 500 [IU]

## 2014-09-08 MED ORDER — SODIUM CHLORIDE 0.9 % IV SOLN
Freq: Once | INTRAVENOUS | Status: AC
  Administered 2014-09-08: 11:00:00 via INTRAVENOUS

## 2014-09-08 MED ORDER — MAGNESIUM SULFATE IN D5W 10-5 MG/ML-% IV SOLN
1.0000 g | Freq: Once | INTRAVENOUS | Status: AC
  Administered 2014-09-08: 1 g via INTRAVENOUS
  Filled 2014-09-08: qty 100

## 2014-09-08 MED ORDER — CETUXIMAB CHEMO IV INJECTION 200 MG/100ML
200.0000 mg/m2 | Freq: Once | INTRAVENOUS | Status: AC
  Administered 2014-09-08: 400 mg via INTRAVENOUS
  Filled 2014-09-08: qty 200

## 2014-09-08 MED ORDER — CYANOCOBALAMIN 1000 MCG/ML IJ SOLN
1000.0000 ug | Freq: Once | INTRAMUSCULAR | Status: AC
  Administered 2014-09-08: 1000 ug via INTRAMUSCULAR

## 2014-09-08 MED ORDER — MAGNESIUM OXIDE 400 (241.3 MG) MG PO TABS
400.0000 mg | ORAL_TABLET | Freq: Every day | ORAL | Status: DC
Start: 2014-09-08 — End: 2014-10-15

## 2014-09-08 MED ORDER — DIPHENHYDRAMINE HCL 50 MG/ML IJ SOLN
INTRAMUSCULAR | Status: AC
  Filled 2014-09-08: qty 1

## 2014-09-08 MED ORDER — CYANOCOBALAMIN 1000 MCG/ML IJ SOLN
INTRAMUSCULAR | Status: AC
  Filled 2014-09-08: qty 1

## 2014-09-08 MED ORDER — DIPHENHYDRAMINE HCL 50 MG/ML IJ SOLN
50.0000 mg | Freq: Once | INTRAMUSCULAR | Status: AC
  Administered 2014-09-08: 50 mg via INTRAVENOUS

## 2014-09-08 MED ORDER — HEPARIN SOD (PORK) LOCK FLUSH 100 UNIT/ML IV SOLN
INTRAVENOUS | Status: AC
  Filled 2014-09-08: qty 5

## 2014-09-08 MED ORDER — SODIUM CHLORIDE 0.9 % IJ SOLN
10.0000 mL | INTRAMUSCULAR | Status: DC | PRN
  Administered 2014-09-08: 10 mL
  Filled 2014-09-08: qty 10

## 2014-09-08 NOTE — Progress Notes (Signed)
Tolerated magnesium infusion well today also

## 2014-09-08 NOTE — Patient Instructions (Addendum)
[place] [facility] Discharge Instructions  RECOMMENDATIONS MADE BY THE CONSULTANT AND ANY TEST RESULTS WILL BE SENT TO YOUR REFERRING PHYSICIAN.  Exam and discussion by Dr. Whitney Muse.  Will proceed with treatment today. Magnesium Oxide 400 mg - take 1 daily extremely important low magnesium can cause problems with your heart. Call with any concerns.  Follow-up in 1 week with chemotherapy and 2 weeks with office visit and 4 weeks with B12 injection.  Thank you for choosing [place] [facility] to provide your oncology and hematology care.  To afford each patient quality time with our provider, please arrive at least 15 minutes before your scheduled appointment time.    You need to re-schedule your appointment should you arrive 10 or more minutes late.  We strive to give you quality time with our providers, and arriving late affects you and other patients whose appointments are after yours.  Also, if you no show three or more times for appointments you may be dismissed from the clinic at the providers discretion.     Again, thank you for choosing [facility].  Our hope is that these requests will decrease the amount of time that you wait before being seen by our physicians.       _____________________________________________________________  Should you have questions after your visit to [facility], please contact our office at [phone] between the hours of 8:30 a.m. and 4:30 p.m.  Voicemails left after 4:30 p.m. will not be returned until the following business day.  For prescription refill requests, have your pharmacy contact our office.

## 2014-09-08 NOTE — Progress Notes (Signed)
[name]'s reason for visit today is for an injection and labs as scheduled per MD orders.   [name] also received vitamin b12 injection in right arm per MD orders; see MAR for administration details.  [name] tolerated all procedures well and without incident; questions were answered and patient was discharged.

## 2014-09-08 NOTE — Progress Notes (Signed)
[name] Tolerated chemotherapy well today discharged ambulatory

## 2014-09-08 NOTE — Patient Instructions (Signed)
[facility] Discharge Instructions for Patients Receiving Chemotherapy  Today you received the following chemotherapy agents erbitux We also give you magnesium IV, and sent you a prescription for magnesium, follow up as scheduled Please call the clinic if you have any questions or concerns  To help prevent nausea and vomiting after your treatment, we encourage you to take your nausea medication {   If you develop nausea and vomiting that is not controlled by your nausea medication, call the clinic. If it is after clinic hours your family physician or the after hours number for the clinic or go to the Emergency Department.   BELOW ARE SYMPTOMS THAT SHOULD BE REPORTED IMMEDIATELY:  *FEVER GREATER THAN 101.0 F  *CHILLS WITH OR WITHOUT FEVER  NAUSEA AND VOMITING THAT IS NOT CONTROLLED WITH YOUR NAUSEA MEDICATION  *UNUSUAL SHORTNESS OF BREATH  *UNUSUAL BRUISING OR BLEEDING  TENDERNESS IN MOUTH AND THROAT WITH OR WITHOUT PRESENCE OF ULCERS  *URINARY PROBLEMS  *BOWEL PROBLEMS  UNUSUAL RASH Items with * indicate a potential emergency and should be followed up as soon as possible.  One of the nurses will contact you 24 hours after your treatment. Please let the nurse know about any problems that you may have experienced. Feel free to call the clinic you have any questions or concerns. The clinic phone number is [phone].   I have been informed and understand all the instructions given to me. I know to contact the clinic, my physician, or go to the Emergency Department if any problems should occur. I do not have any questions at this time, but understand that I may call the clinic during office hours or the Patient Navigator at [phone] should I have any questions or need assistance in obtaining follow up care.

## 2014-09-08 NOTE — Progress Notes (Signed)
[name] Kilts, MD [place] [address]  Stage IV Rectal Cancer Pancytopenia  CURRENT THERAPY: Erbitux single-agent beginning on (03/17/14)   INTERVAL HISTORY: [name] 64 y.o. 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 He has a chronic pancytopenia. He has no major complaints today.  He continues to improve left clavicle fracture. He states he knows he will have chronic pain. He is able to do more of his ADLs now however. His magnesium is just below normal, he does not take it because he says it makes him sleep. Continues on Erbitux therapy. It has been some time since staging studies have been done, but he looks excellent, and CEA level fluctuates and has not steadily increased.    Adenocarcinoma of rectum   04/11/2011 - 07/17/2012 Chemotherapy FOLFOX + Avastin   02/24/2014 Progression CT CAP-  Findings within the chest are worrisome for recurrent tumor with bilateral pulmonary metastasis.   03/17/2014 - 04/21/2014 Chemotherapy Erbitux single agent   04/28/2014 - 05/18/2014 Hospital Admission Hematuria,  AKI (acute kidney injury),  Obstructive uropathy,  Anemia,  Metabolic acidosis. S/p left PCN. Removed 12/20 by GU. Received 20 units of PRBCs and 11 units of platelets during this admission   06/09/2014 -  Chemotherapy Re-load with single-agent Erbitux   07/07/2014 Adverse Reaction Mild skin fissures of hands.  Secondary to Cetuximab.  Will treat supportively.     Past Medical History  Diagnosis Date  . Pancytopenia, acquired 12/04/2010  . B12 deficiency 12/07/2010  . Kidney stones   . CAD (coronary artery disease)   . Anemia   . History of radiation therapy 06/19/07 -07/27/07    whole pelvis  . Hx of radiation therapy 08/06/12,08/09/12,& 08/13/12    rul 56GY/66f  . Cancer     Colon ca dx 2008 surg/rad/chemo  . Lung cancer   . FH: chemotherapy   . Myocardial infarction   . Rectal  bleeding 09/20/2013    From recurrent rectal mass/recurrent rectal adenocarcinoma  . Acute blood loss anemia 09/27/2013  . 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  2.7 cm primary, moderately differentiated with 3/8 positive lymph nodes without LVI.  Surgery was on 04/22/2007 and was treated postoperatively with radiation and concomitant capecitabine and then 4 cycles of a planned 6 with oxaliplatin and capecitabine.  His counts would not allow treatment of  . Hydronephrosis of left kidney 09/24/2013    Kidney stones; s/p cystoscopy and stent  . Gross hematuria 09/25/2103  . Lung metastases 07/09/2013  . Secondary malignancy of right lung 07/09/2013  . Insomnia 06/09/2014  . Foley catheter in place 07/02/14  . Broken collarbone feb 2016    has DIABETES MELLITUS, TYPE II; HYPERCHOLESTEROLEMIA; Deficiency anemia; CORONARY ARTERY DISEASE; SLEEP APNEA; Adenocarcinoma of rectum; Pancytopenia, acquired; B12 deficiency; Port catheter in place; Hx of radiation therapy; Thrombocytopenia; Secondary malignancy of right lung; Rectal bleeding; ARF (acute renal failure); Hydronephrosis; Hydronephrosis of left kidney; GI bleeding; Acute blood loss anemia; Urinary retention; Gross hematuria; AKI (acute kidney injury); Anemia; Hyperkalemia; Nephrolithiasis; and Insomnia on his problem list.     is allergic to daypro and xeloda.  Mr. [name] no medications administered during this visit.  Past Surgical History  Procedure Laterality Date  . Ileostomy  12/07/2010    Procedure: ILEOSTOMY;  Surgeon: BLollie Sails  Geroge Baseman;  Location: [facility];  Service: General;  Laterality: N/A;  Diverting Ileostomy Lysis of adhesions Exploratory Laparotomy  . Lung biopsy  10/27/10    rt lobe- adenocarcinoma  . Cholecystectomy    . Cataract extraction w/ intraocular lens implant  2007    bil  . Lithotripsy  2002  . Coronary angioplasty with  stent placement  2003    stenting x 2  . Colon resection  2008    x2  . Esophagogastroduodenoscopy N/A 05/02/2013    Dr. Gala Romney- polypoid antral fold, bx= reactive gastropathy  . Givens capsule study N/A 05/02/2013    Procedure: GIVENS CAPSULE STUDY;  Surgeon: Daneil Dolin, MD;  Location: [facility];  Service: Endoscopy;  Laterality: N/A;  . Colonoscopy N/A 09/20/2013    SLF:A near circumferential fungating mass with friable surfaces was found in the rectum.  ACTIVELY OOZING.  CLOTS SEEN IN LUMEN AND ASPIRATED.  Multiple biopsies were performed using cold forceps.  Thermal therapy(BICAP 7 Fr 25W) was used to ATTEMPT TO control bleeding.    HEMOSTASIS NOT ACHEIVED.  Marland Kitchen Cystoscopy w/ ureteral stent placement Left 09/24/2013    Procedure: CYSTOSCOPY WITH LEFT RETROGRADE PYELOGRAM; LEFT URETERAL STENT PLACEMENT; REMOVAL OF SMALL BLADDER CALCULI;  Surgeon: Marissa Nestle, MD;  Location: [facility];  Service: Urology;  Laterality: Left;  . Flexible sigmoidoscopy N/A 09/25/2013    GBT:DVVOHYWVP exophytic rectal mass most likely representing recurrent adenocarcinoma status post biopsy  . Cystoscopy with retrograde pyelogram, ureteroscopy and stent placement Left 05/08/2014    Procedure: CYSTOSCOPY/ EVACUATION OF CLOTS  WITH LEFT RETROGRADE PYELOGRAM,LEFT URETEROSCOPY ;  Surgeon: Malka So, MD;  Location: [facility];  Service: Urology;  Laterality: Left;  . Nephrolithotomy Left 05/13/2014    Procedure: LEFT NEPHROLITHOTOMY PERCUTANEOUS FIRST STAGE;  Surgeon: Malka So, MD;  Location: [facility];  Service: Urology;  Laterality: Left;  . Holmium laser application Left 71/10/2692    Procedure: HOLMIUM LASER APPLICATION;  Surgeon: Malka So, MD;  Location: [facility];  Service: Urology;  Laterality: Left;    Denies any headaches, dizziness, double vision, fevers, chills, night sweats, nausea, vomiting, diarrhea, constipation, chest pain, heart palpitations, shortness of breath, blood in stool, black tarry stool,  urinary pain, urinary burning, urinary frequency, hematuria.   PHYSICAL EXAMINATION  ECOG PERFORMANCE STATUS: 1 - Symptomatic but completely ambulatory  Filed Vitals:   09/08/14 1017  BP: 116/69  Pulse: 71  Temp: 97.8 F (36.6 C)  Resp: 18    GENERAL:alert, no distress, well nourished, well developed, comfortable, cooperative and smiling SKIN: no significant erbitux rash HEAD: Normocephalic, No masses, lesions, tenderness or abnormalities EYES: normal, PERRLA, EOMI, Conjunctiva are pink and non-injected EARS: External ears normal OROPHARYNX:lips, buccal mucosa, and tongue normal and mucous membranes are moist  NECK: supple, no adenopathy, thyroid normal size, non-tender, without nodularity, no stridor, non-tender, trachea midline LYMPH:  no palpable lymphadenopathy LUNGS: clear to auscultation and percussion HEART: regular rate & rhythm, no murmurs, no gallops, S1 normal and S2 normal ABDOMEN:abdomen soft, non-tender, normal bowel sounds and no masses or organomegaly BACK: Back symmetric, no curvature. EXTREMITIES:less then 2 second capillary refill, no joint deformities, effusion, or inflammation, no skin discoloration, no cyanosis.  See skin exam above. NEURO: alert & oriented x 3 with fluent speech, no focal motor/sensory deficits, gait normal   LABORATORY DATA:  CBC    Component Value Date/Time   WBC 3.6* 09/01/2014 1000   RBC 2.82* 09/01/2014 1000   HGB 9.6*  09/01/2014 1000   HCT 28.7* 09/01/2014 1000   PLT 47* 09/01/2014 1000   MCV 101.8* 09/01/2014 1000   MCH 34.0 09/01/2014 1000   MCHC 33.4 09/01/2014 1000   RDW 14.3 09/01/2014 1000   LYMPHSABS 0.5* 09/01/2014 1000   MONOABS 0.3 09/01/2014 1000   EOSABS 0.2 09/01/2014 1000   BASOSABS 0.0 09/01/2014 1000   CMP Latest Ref Rng 09/01/2014 08/18/2014 08/04/2014  Glucose 70 - 99 mg/dL 83 120(H) 94  BUN 6 - 23 mg/dL _0 Creatinine 0.50 - 1.35 mg/dL 1.53(H) 1.60(H) 1.51(H)  Sodium 135 - 145 mmol/L 140 139 142    Potassium 3.5 - 5.1 mmol/L 4.4 3.8 3.7  Chloride 96 - 112 mmol/L 117(H) 115(H) 115(H)  CO2 19 - 32 mmol/L 19 18(L) 22  Calcium 8.4 - 10.5 mg/dL 8.4 8.2(L) 8.4  Total Protein 6.0 - 8.3 g/dL 6.5 6.4 6.1  Total Bilirubin 0.3 - 1.2 mg/dL 1.3(H) 0.8 0.8  Alkaline Phos 39 - 117 U/L 117 119(H) 108  AST 0 - 37 U/L 32 50(H) 36  ALT 0 - 53 U/L 24 37 20    ASSESSMENT AND PLAN:   Stage IV rectal cancer  64 year old male with stage IV rectal cancer currently on single agent Erbitux. He tolerates treatment without difficulty. Last imaging studies were about 6 months ago. His studies have not been repeated as it is felt his CEA is reasonably helpful in following his disease, in addition his performance status is good. He is not limited in his ADLs. He describes his appetite is good. We will continue forward with current therapy making no changes. We will see him back prior to his next cycle of Erbitux.  We can certainly consider restaging him in the near future if needed.  Pancytopenia  Certainly multifactorial. We will continue with ongoing observation but we may need a more aggressive evaluation moving forward. He is not interested in pursuing a BMBX at this time.   Continue therapy as planned. Follow-up with urology as directed.  All questions were answered. The patient knows to call the clinic with any problems, questions or concerns. We can certainly see the patient much sooner if necessary.  This note was electronically signed Richrd Sox 09/08/2014

## 2014-09-15 ENCOUNTER — Encounter (HOSPITAL_BASED_OUTPATIENT_CLINIC_OR_DEPARTMENT_OTHER): Payer: Managed Care, Other (non HMO)

## 2014-09-15 VITALS — BP 108/45 | HR 69 | Temp 98.5°F | Resp 20 | Wt 203.6 lb

## 2014-09-15 DIAGNOSIS — C20 Malignant neoplasm of rectum: Secondary | ICD-10-CM | POA: Diagnosis not present

## 2014-09-15 DIAGNOSIS — Z5112 Encounter for antineoplastic immunotherapy: Secondary | ICD-10-CM | POA: Diagnosis not present

## 2014-09-15 DIAGNOSIS — E538 Deficiency of other specified B group vitamins: Secondary | ICD-10-CM | POA: Diagnosis not present

## 2014-09-15 LAB — COMPREHENSIVE METABOLIC PANEL
ALT: 18 U/L (ref 0–53)
AST: 26 U/L (ref 0–37)
Albumin: 2.6 g/dL — ABNORMAL LOW (ref 3.5–5.2)
Alkaline Phosphatase: 100 U/L (ref 39–117)
Anion gap: 5 (ref 5–15)
BILIRUBIN TOTAL: 1.7 mg/dL — AB (ref 0.3–1.2)
BUN: 21 mg/dL (ref 6–23)
CHLORIDE: 114 mmol/L — AB (ref 96–112)
CO2: 18 mmol/L — AB (ref 19–32)
Calcium: 8.1 mg/dL — ABNORMAL LOW (ref 8.4–10.5)
Creatinine, Ser: 1.84 mg/dL — ABNORMAL HIGH (ref 0.50–1.35)
GFR calc Af Amer: 44 mL/min — ABNORMAL LOW (ref >90)
GFR, EST NON AFRICAN AMERICAN: 38 mL/min — AB (ref >90)
GLUCOSE: 103 mg/dL — AB (ref 70–99)
POTASSIUM: 4.1 mmol/L (ref 3.5–5.1)
SODIUM: 137 mmol/L (ref 135–145)
Total Protein: 5.7 g/dL — ABNORMAL LOW (ref 6.0–8.3)

## 2014-09-15 LAB — MAGNESIUM: MAGNESIUM: 1.3 mg/dL — AB (ref 1.5–2.5)

## 2014-09-15 LAB — CBC WITH DIFFERENTIAL/PLATELET
BASOS ABS: 0 10*3/uL (ref 0.0–0.1)
BASOS PCT: 0 % (ref 0–1)
EOS ABS: 0 10*3/uL (ref 0.0–0.7)
Eosinophils Relative: 0 % (ref 0–5)
HEMATOCRIT: 22.7 % — AB (ref 39.0–52.0)
Hemoglobin: 7.6 g/dL — ABNORMAL LOW (ref 13.0–17.0)
Lymphocytes Relative: 5 % — ABNORMAL LOW (ref 12–46)
Lymphs Abs: 0.3 10*3/uL — ABNORMAL LOW (ref 0.7–4.0)
MCH: 33.3 pg (ref 26.0–34.0)
MCHC: 33.5 g/dL (ref 30.0–36.0)
MCV: 99.6 fL (ref 78.0–100.0)
MONO ABS: 0.9 10*3/uL (ref 0.1–1.0)
Monocytes Relative: 13 % — ABNORMAL HIGH (ref 3–12)
Neutro Abs: 5.8 10*3/uL (ref 1.7–7.7)
Neutrophils Relative %: 82 % — ABNORMAL HIGH (ref 43–77)
PLATELETS: 54 10*3/uL — AB (ref 150–400)
RBC: 2.28 MIL/uL — ABNORMAL LOW (ref 4.22–5.81)
RDW: 13.6 % (ref 11.5–15.5)
WBC: 7 10*3/uL (ref 4.0–10.5)

## 2014-09-15 LAB — PREPARE RBC (CROSSMATCH)

## 2014-09-15 MED ORDER — DIPHENHYDRAMINE HCL 50 MG/ML IJ SOLN
INTRAMUSCULAR | Status: AC
  Filled 2014-09-15: qty 1

## 2014-09-15 MED ORDER — DIPHENHYDRAMINE HCL 50 MG/ML IJ SOLN
50.0000 mg | Freq: Once | INTRAMUSCULAR | Status: AC
  Administered 2014-09-15: 50 mg via INTRAVENOUS

## 2014-09-15 MED ORDER — SODIUM CHLORIDE 0.9 % IV SOLN
Freq: Once | INTRAVENOUS | Status: AC
  Administered 2014-09-15: 12:00:00 via INTRAVENOUS

## 2014-09-15 MED ORDER — CETUXIMAB CHEMO IV INJECTION 200 MG/100ML
200.0000 mg/m2 | Freq: Once | INTRAVENOUS | Status: AC
  Administered 2014-09-15: 400 mg via INTRAVENOUS
  Filled 2014-09-15: qty 200

## 2014-09-15 MED ORDER — HEPARIN SOD (PORK) LOCK FLUSH 100 UNIT/ML IV SOLN
500.0000 [IU] | Freq: Once | INTRAVENOUS | Status: AC | PRN
  Administered 2014-09-15: 500 [IU]
  Filled 2014-09-15: qty 5

## 2014-09-15 MED ORDER — SODIUM CHLORIDE 0.9 % IJ SOLN
10.0000 mL | INTRAMUSCULAR | Status: DC | PRN
  Administered 2014-09-15: 10 mL
  Filled 2014-09-15: qty 10

## 2014-09-15 MED ORDER — HYDROCODONE-ACETAMINOPHEN 7.5-325 MG PO TABS: 1.0000 | ORAL_TABLET | Freq: Four times a day (QID) | ORAL | Status: DC | PRN

## 2014-09-15 NOTE — Progress Notes (Signed)
[name] Start Tolerated chemotherapy well today

## 2014-09-15 NOTE — Patient Instructions (Signed)
..  [facility] Discharge Instructions for Patients Receiving Chemotherapy  Today you received the following chemotherapy agents Erbitux  Return Wednesday for transfusion at 1130   If you develop nausea and vomiting, or diarrhea that is not controlled by your medication, call the clinic.  The clinic phone number is [phone]. Office hours are Monday-Friday 8:30am-5:00pm.  BELOW ARE SYMPTOMS THAT SHOULD BE REPORTED IMMEDIATELY:  *FEVER GREATER THAN 101.0 F  *CHILLS WITH OR WITHOUT FEVER  NAUSEA AND VOMITING THAT IS NOT CONTROLLED WITH YOUR NAUSEA MEDICATION  *UNUSUAL SHORTNESS OF BREATH  *UNUSUAL BRUISING OR BLEEDING  TENDERNESS IN MOUTH AND THROAT WITH OR WITHOUT PRESENCE OF ULCERS  *URINARY PROBLEMS  *BOWEL PROBLEMS  UNUSUAL RASH Items with * indicate a potential emergency and should be followed up as soon as possible. If you have an emergency after office hours please contact your primary care physician or go to the nearest emergency department.  Please call the clinic during office hours if you have any questions or concerns.   You may also contact the Patient Navigator at [phone] should you have any questions or need assistance in obtaining follow up care. _____________________________________________________________________ Have you asked about our STAR program?    STAR stands for Survivorship Training and Rehabilitation, and this is a nationally recognized cancer care program that focuses on survivorship and rehabilitation.  Cancer and cancer treatments may cause problems, such as, pain, making you feel tired and keeping you from doing the things that you need or want to do. Cancer rehabilitation can help. Our goal is to reduce these troubling effects and help you have the best quality of life possible.  You may receive a survey from a nurse that asks questions about your current state of health.  Based on the survey results, all eligible patients  will be referred to the [facility] program for an evaluation so we can better serve you! A frequently asked questions sheet is available upon request.

## 2014-09-16 LAB — CEA: CEA: 13.5 ng/mL — ABNORMAL HIGH (ref 0.0–4.7)

## 2014-09-17 ENCOUNTER — Encounter (HOSPITAL_BASED_OUTPATIENT_CLINIC_OR_DEPARTMENT_OTHER): Payer: Managed Care, Other (non HMO)

## 2014-09-17 DIAGNOSIS — D61818 Other pancytopenia: Secondary | ICD-10-CM

## 2014-09-17 DIAGNOSIS — D630 Anemia in neoplastic disease: Secondary | ICD-10-CM

## 2014-09-17 DIAGNOSIS — E538 Deficiency of other specified B group vitamins: Secondary | ICD-10-CM | POA: Diagnosis not present

## 2014-09-17 DIAGNOSIS — C20 Malignant neoplasm of rectum: Secondary | ICD-10-CM

## 2014-09-17 MED ORDER — HEPARIN SOD (PORK) LOCK FLUSH 100 UNIT/ML IV SOLN
250.0000 [IU] | INTRAVENOUS | Status: DC | PRN
Start: 2014-09-17 — End: 2014-09-17

## 2014-09-17 MED ORDER — DIPHENHYDRAMINE HCL 25 MG PO CAPS
25.0000 mg | ORAL_CAPSULE | Freq: Once | ORAL | Status: AC
  Administered 2014-09-17: 25 mg via ORAL

## 2014-09-17 MED ORDER — ACETAMINOPHEN 325 MG PO TABS
650.0000 mg | ORAL_TABLET | Freq: Once | ORAL | Status: AC
  Administered 2014-09-17: 650 mg via ORAL

## 2014-09-17 MED ORDER — SODIUM CHLORIDE 0.9 % IJ SOLN
10.0000 mL | INTRAMUSCULAR | Status: AC | PRN
  Administered 2014-09-17: 10 mL

## 2014-09-17 MED ORDER — HEPARIN SOD (PORK) LOCK FLUSH 100 UNIT/ML IV SOLN
INTRAVENOUS | Status: AC
  Filled 2014-09-17: qty 5

## 2014-09-17 MED ORDER — DIPHENHYDRAMINE HCL 25 MG PO CAPS
ORAL_CAPSULE | ORAL | Status: AC
  Filled 2014-09-17: qty 1

## 2014-09-17 MED ORDER — HEPARIN SOD (PORK) LOCK FLUSH 100 UNIT/ML IV SOLN
500.0000 [IU] | Freq: Once | INTRAVENOUS | Status: AC
  Administered 2014-09-17: 500 [IU] via INTRAVENOUS

## 2014-09-17 MED ORDER — SODIUM CHLORIDE 0.9 % IV SOLN
250.0000 mL | Freq: Once | INTRAVENOUS | Status: AC
  Administered 2014-09-17: 250 mL via INTRAVENOUS

## 2014-09-17 NOTE — Progress Notes (Signed)
Tolerated first unit packed red blood cell transfusion without s/s adverse reaction. Tolerated second unit packed red blood cell transfusion without s/s adverse reaction.

## 2014-09-17 NOTE — Patient Instructions (Signed)
[place] at [facility] Discharge Instructions  RECOMMENDATIONS MADE BY THE CONSULTANT AND ANY TEST RESULTS WILL BE SENT TO YOUR REFERRING PHYSICIAN.  Blood transfusion today as ordered. Return as scheduled.  Thank you for choosing [place] at [facility] to provide your oncology and hematology care.  To afford each patient quality time with our provider, please arrive at least 15 minutes before your scheduled appointment time.    You need to re-schedule your appointment should you arrive 10 or more minutes late.  We strive to give you quality time with our providers, and arriving late affects you and other patients whose appointments are after yours.  Also, if you no show three or more times for appointments you may be dismissed from the clinic at the providers discretion.     Again, thank you for choosing  [facility].  Our hope is that these requests will decrease the amount of time that you wait before being seen by our physicians.       _____________________________________________________________  Should you have questions after your visit to [facility], please contact our office at [phone] between the hours of 8:30 a.m. and 4:30 p.m.  Voicemails left after 4:30 p.m. will not be returned until the following business day.  For prescription refill requests, have your pharmacy contact our office.

## 2014-09-18 LAB — TYPE AND SCREEN
ABO/RH(D): A POS
Antibody Screen: NEGATIVE
Unit division: 0
Unit division: 0

## 2014-09-19 ENCOUNTER — Ambulatory Visit (INDEPENDENT_AMBULATORY_CARE_PROVIDER_SITE_OTHER): Payer: Managed Care, Other (non HMO) | Admitting: Urology

## 2014-09-19 DIAGNOSIS — N135 Crossing vessel and stricture of ureter without hydronephrosis: Secondary | ICD-10-CM

## 2014-09-19 DIAGNOSIS — R339 Retention of urine, unspecified: Secondary | ICD-10-CM

## 2014-09-19 DIAGNOSIS — R310 Gross hematuria: Secondary | ICD-10-CM | POA: Diagnosis not present

## 2014-09-20 NOTE — Assessment & Plan Note (Signed)
Multifactorial.  Best intervention is BMBX which he has declined.  Episodes of progressive anemia requiring PRC transfusion with gross hematuria.  Followed by urology.

## 2014-09-20 NOTE — Progress Notes (Signed)
This encounter was created in error - please disregard.

## 2014-09-20 NOTE — Assessment & Plan Note (Signed)
64 year old male with stage IV rectal cancer currently on single agent Erbitux.  Tolerating therapy well.  Biochemical increase with rising CEA.  He is asymptomatic.  Given increasing CEA and interval between imaging at this time, I will get [place] set-up for restaging CT CAP to evaluate diease burden.    Pre-chemo labs as planned with therapy today.  CT CAP with contrast for staging purposes.  Return in 2 weeks after CT scans for follow-up and review of data.

## 2014-09-22 ENCOUNTER — Inpatient Hospital Stay (HOSPITAL_COMMUNITY): Payer: Medicare Other

## 2014-09-22 ENCOUNTER — Ambulatory Visit (HOSPITAL_COMMUNITY): Payer: Managed Care, Other (non HMO) | Admitting: Oncology

## 2014-09-22 ENCOUNTER — Other Ambulatory Visit (HOSPITAL_COMMUNITY): Payer: Self-pay | Admitting: Oncology

## 2014-09-22 ENCOUNTER — Other Ambulatory Visit: Payer: Self-pay | Admitting: Urology

## 2014-09-22 DIAGNOSIS — D696 Thrombocytopenia, unspecified: Secondary | ICD-10-CM

## 2014-09-22 MED ORDER — SODIUM CHLORIDE 0.9 % IV SOLN
Freq: Once | INTRAVENOUS | Status: AC
  Administered 2014-09-26: 18:00:00 via INTRAVENOUS

## 2014-09-23 ENCOUNTER — Ambulatory Visit (INDEPENDENT_AMBULATORY_CARE_PROVIDER_SITE_OTHER): Payer: Self-pay | Admitting: Orthopedic Surgery

## 2014-09-23 VITALS — BP 117/66 | Ht 70.0 in | Wt 203.6 lb

## 2014-09-23 DIAGNOSIS — S42002D Fracture of unspecified part of left clavicle, subsequent encounter for fracture with routine healing: Secondary | ICD-10-CM

## 2014-09-23 NOTE — Progress Notes (Signed)
Patient ID: [name], male   DOB: [birth year], 64 y.o.   MRN: [id number]  Chief Complaint  Patient presents with  . Follow-up    6 week follow up Left shoulder fx, DOI 07/09/14   The patient had a distal clavicle fracture but his medical condition prevents any surgical intervention although it was displaced. He has no threat of the skin although the fracture and is causing some skin tenting. Skin is healed is no evidence of breakthrough of the skin is range of motion is now returned to almost normal he's working on [place]man exercises and he has no pain  He is released continue exercises at home.

## 2014-09-24 ENCOUNTER — Inpatient Hospital Stay (HOSPITAL_COMMUNITY): Payer: Medicare Other

## 2014-09-24 ENCOUNTER — Ambulatory Visit (HOSPITAL_COMMUNITY): Payer: Managed Care, Other (non HMO) | Admitting: Oncology

## 2014-09-24 NOTE — Patient Instructions (Addendum)
[name]  09/24/2014   Your procedure is scheduled on: 10/02/2014    Report to [facility] Main  Entrance and follow signs to               [place] at    1130 AM.  Call this number if you have problems the morning of surgery [phone]   Remember:  Do not eat food after midnite.  May have clear liquids until 0700am then nothing by mouth morning of surgery    CLEAR LIQUID DIET   Foods Allowed                                                                     Foods Excluded  Coffee and tea, regular and decaf                             liquids that you cannot  Plain Jell-O in any flavor                                             see through such as: Fruit ices (not with fruit pulp)                                     milk, soups, orange juice  Iced Popsicles                                    All solid food Carbonated beverages, regular and diet                                    Cranberry, grape and apple juices Sports drinks like Gatorade Lightly seasoned clear broth or consume(fat free) Sugar, honey syrup  Sample Menu Breakfast                                Lunch                                     Supper Cranberry juice                    Beef broth                            Chicken broth Jell-O                                     Grape juice  Apple juice Coffee or tea                        Jell-O                                      Popsicle                                                Coffee or tea                        Coffee or tea  _____________________________________________________________________       Take these medicines the morning of surgery with A SIP OF WATER:Prilosec                                 You may not have any metal on your body including hair pins and              piercings  Do not wear jewelry, make-up, lotions, powders or perfumes.             Do not wear nail polish.  Do  not shave  48 hours prior to surgery.              Men may shave face and neck.   Do not bring valuables to the hospital. [place].  Contacts, dentures or bridgework may not be worn into surgery.  Leave suitcase in the car. After surgery it may be brought to your room.     Patients discharged the day of surgery will not be allowed to drive home.  Name and phone number of your driver:  Special Instructions: N/A              Please read over the following fact sheets you were given: _____________________________________________________________________             [facility] - Preparing for Surgery Before surgery, you can play an important role.  Because skin is not sterile, your skin needs to be as free of germs as possible.  You can reduce the number of germs on your skin by washing with CHG (chlorahexidine gluconate) soap before surgery.  CHG is an antiseptic cleaner which kills germs and bonds with the skin to continue killing germs even after washing. Please DO NOT use if you have an allergy to CHG or antibacterial soaps.  If your skin becomes reddened/irritated stop using the CHG and inform your nurse when you arrive at Short Stay. Do not shave (including legs and underarms) for at least 48 hours prior to the first CHG shower.  You may shave your face/neck. Please follow these instructions carefully:  1.  Shower with CHG Soap the night before surgery and the  morning of Surgery.  2.  If you choose to wash your hair, wash your hair first as usual with your  normal  shampoo.  3.  After you shampoo, rinse your hair and body thoroughly to remove the  shampoo.  4.  Use CHG as you would any other liquid soap.  You can apply chg directly  to the skin and wash                       Gently with a scrungie or clean washcloth.  5.  Apply the CHG Soap to your body ONLY FROM THE NECK DOWN.   Do not use on face/ open                            Wound or open sores. Avoid contact with eyes, ears mouth and genitals (private parts).                       Wash face,  Genitals (private parts) with your normal soap.             6.  Wash thoroughly, paying special attention to the area where your surgery  will be performed.  7.  Thoroughly rinse your body with warm water from the neck down.  8.  DO NOT shower/wash with your normal soap after using and rinsing off  the CHG Soap.                9.  Pat yourself dry with a clean towel.            10.  Wear clean pajamas.            11.  Place clean sheets on your bed the night of your first shower and do not  sleep with pets. Day of Surgery : Do not apply any lotions/deodorants the morning of surgery.  Please wear clean clothes to the hospital/surgery center.  FAILURE TO FOLLOW THESE INSTRUCTIONS MAY RESULT IN THE CANCELLATION OF YOUR SURGERY PATIENT SIGNATURE_________________________________  NURSE SIGNATURE__________________________________  ________________________________________________________________________

## 2014-09-25 ENCOUNTER — Encounter (HOSPITAL_COMMUNITY)
Admission: RE | Admit: 2014-09-25 | Discharge: 2014-09-25 | Disposition: A | Discharge status: Home / Self Care | Payer: Managed Care, Other (non HMO) | Source: Ambulatory Visit | Attending: Urology | Admitting: Urology

## 2014-09-25 ENCOUNTER — Inpatient Hospital Stay (HOSPITAL_COMMUNITY): Payer: Medicare Other

## 2014-09-25 ENCOUNTER — Encounter (HOSPITAL_COMMUNITY): Payer: Self-pay

## 2014-09-25 DIAGNOSIS — Z01812 Encounter for preprocedural laboratory examination: Secondary | ICD-10-CM | POA: Insufficient documentation

## 2014-09-25 DIAGNOSIS — T8389XA Other specified complication of genitourinary prosthetic devices, implants and grafts, initial encounter: Secondary | ICD-10-CM | POA: Diagnosis not present

## 2014-09-25 HISTORY — DX: Cardiac murmur, unspecified: R01.1

## 2014-09-25 HISTORY — DX: Sleep apnea, unspecified: G47.30

## 2014-09-25 HISTORY — DX: Gastro-esophageal reflux disease without esophagitis: K21.9

## 2014-09-25 HISTORY — DX: Type 2 diabetes mellitus without complications: E11.9

## 2014-09-25 HISTORY — DX: Reserved for inherently not codable concepts without codable children: IMO0001

## 2014-09-25 LAB — CBC
HEMATOCRIT: 27.7 % — AB (ref 39.0–52.0)
Hemoglobin: 9.4 g/dL — ABNORMAL LOW (ref 13.0–17.0)
MCH: 31.6 pg (ref 26.0–34.0)
MCHC: 33.9 g/dL (ref 30.0–36.0)
MCV: 93.3 fL (ref 78.0–100.0)
Platelets: 78 10*3/uL — ABNORMAL LOW (ref 150–400)
RBC: 2.97 MIL/uL — AB (ref 4.22–5.81)
RDW: 15.5 % (ref 11.5–15.5)
WBC: 9.7 10*3/uL (ref 4.0–10.5)

## 2014-09-25 LAB — COMPREHENSIVE METABOLIC PANEL
ALBUMIN: 2.8 g/dL — AB (ref 3.5–5.2)
ALT: 47 U/L (ref 0–53)
AST: 53 U/L — ABNORMAL HIGH (ref 0–37)
Alkaline Phosphatase: 210 U/L — ABNORMAL HIGH (ref 39–117)
Anion gap: 12 (ref 5–15)
BUN: 78 mg/dL — AB (ref 6–23)
CHLORIDE: 108 mmol/L (ref 96–112)
CO2: 15 mmol/L — AB (ref 19–32)
CREATININE: 4.25 mg/dL — AB (ref 0.50–1.35)
Calcium: 8.6 mg/dL (ref 8.4–10.5)
GFR calc non Af Amer: 14 mL/min — ABNORMAL LOW (ref >90)
GFR, EST AFRICAN AMERICAN: 16 mL/min — AB (ref >90)
Glucose, Bld: 101 mg/dL — ABNORMAL HIGH (ref 70–99)
POTASSIUM: 5.6 mmol/L — AB (ref 3.5–5.1)
Sodium: 135 mmol/L (ref 135–145)
Total Bilirubin: 2.3 mg/dL — ABNORMAL HIGH (ref 0.3–1.2)
Total Protein: 7 g/dL (ref 6.0–8.3)

## 2014-09-25 NOTE — Progress Notes (Signed)
EKG- 04/29/2014  LOV with ONC- 09/22/14- EPIC  05/14/2014- CT of abdomen discusses lungs

## 2014-09-25 NOTE — Progress Notes (Signed)
CBC and CMP done 10/02/2014 faxed via EPIC to Dr Jeffie Pollock.

## 2014-09-25 NOTE — Progress Notes (Addendum)
Left voice mail  message for Lattie Haw ( nurse of Dr Jeffie Pollock) regarding the fact that labs done 09/25/2014 faxed to Dr Jeffie Pollock In [facility].  Asked for Lattie Haw to call me back to note she had received labs.  Left call back number of [phone].  Lisa per office is in office today.

## 2014-09-25 NOTE — Progress Notes (Signed)
Called office back this pm at 1530 pm and asked to speak to nurse or MD regarding abnormal lab results on patient and stated I needed to speak to someone.  Was placed thru to voice mail.  I walked to [facility] and spoke with Darrel Reach , surgery scheduler for Dr Jeffie Pollock.  Made her aware that labs had been faxed x 2 to Main office via EPIC , labs faxed to pod of Dr Jeffie Pollock with fax confirmation and the phone calls that had been made to try and report abnormal lab results. Hassan Rowan, RN at office made aware of above along with Freda Munro, Glass blower/designer made aware of above issue.  Dr Jeffie Pollock not in office today.  Dr Karsten Ro on call but not in office.  Hassan Rowan, RN at office took me to Dr Raynelle Bring and Dr Alinda Money shown abnormal lab results done on 09/25/2014.  Dr Alinda Money gave Hassan Rowan, RN, instructions to call patient regarding abnormal lab results and for patient to see PCP on 09/26/2014

## 2014-09-25 NOTE — Progress Notes (Signed)
Called and asked to speak to Lattie Haw, nurse of Dr Jeffie Pollock  Regarding lab results on a patient.  I was told she was unavailable.  Left another voice mail message to Lattie Haw on her voice mail to please call to state she had received abnormal lab results on patient.

## 2014-09-25 NOTE — Progress Notes (Signed)
CMP and CBC results done 09/25/2014 faxed to pod of Dr Jeffie Pollock - [phone]

## 2014-09-26 ENCOUNTER — Encounter (HOSPITAL_COMMUNITY): Payer: Self-pay | Admitting: *Deleted

## 2014-09-26 ENCOUNTER — Inpatient Hospital Stay (HOSPITAL_COMMUNITY): Payer: Managed Care, Other (non HMO) | Admitting: Anesthesiology

## 2014-09-26 ENCOUNTER — Encounter (HOSPITAL_COMMUNITY)
Admission: EM | Disposition: A | Discharge status: Home Health | Payer: Self-pay | Source: Home / Self Care | Attending: Internal Medicine

## 2014-09-26 ENCOUNTER — Inpatient Hospital Stay (HOSPITAL_COMMUNITY): Payer: Managed Care, Other (non HMO)

## 2014-09-26 ENCOUNTER — Inpatient Hospital Stay (HOSPITAL_COMMUNITY)
Admission: EM | Admit: 2014-09-26 | Discharge: 2014-10-11 | DRG: 660 | Disposition: A | Discharge status: Home Health | Payer: Managed Care, Other (non HMO) | Attending: Internal Medicine | Admitting: Internal Medicine

## 2014-09-26 ENCOUNTER — Encounter (HOSPITAL_COMMUNITY): Payer: Managed Care, Other (non HMO)

## 2014-09-26 ENCOUNTER — Emergency Department (HOSPITAL_COMMUNITY): Payer: Managed Care, Other (non HMO)

## 2014-09-26 ENCOUNTER — Telehealth (HOSPITAL_COMMUNITY): Payer: Self-pay | Admitting: Oncology

## 2014-09-26 DIAGNOSIS — Z8249 Family history of ischemic heart disease and other diseases of the circulatory system: Secondary | ICD-10-CM

## 2014-09-26 DIAGNOSIS — K7460 Unspecified cirrhosis of liver: Secondary | ICD-10-CM | POA: Diagnosis present

## 2014-09-26 DIAGNOSIS — N183 Chronic kidney disease, stage 3 (moderate): Secondary | ICD-10-CM | POA: Diagnosis present

## 2014-09-26 DIAGNOSIS — C7801 Secondary malignant neoplasm of right lung: Secondary | ICD-10-CM | POA: Diagnosis present

## 2014-09-26 DIAGNOSIS — N19 Unspecified kidney failure: Secondary | ICD-10-CM

## 2014-09-26 DIAGNOSIS — D62 Acute posthemorrhagic anemia: Secondary | ICD-10-CM | POA: Diagnosis not present

## 2014-09-26 DIAGNOSIS — N189 Chronic kidney disease, unspecified: Secondary | ICD-10-CM | POA: Diagnosis not present

## 2014-09-26 DIAGNOSIS — D61818 Other pancytopenia: Secondary | ICD-10-CM

## 2014-09-26 DIAGNOSIS — Z933 Colostomy status: Secondary | ICD-10-CM

## 2014-09-26 DIAGNOSIS — D510 Vitamin B12 deficiency anemia due to intrinsic factor deficiency: Secondary | ICD-10-CM | POA: Diagnosis not present

## 2014-09-26 DIAGNOSIS — R310 Gross hematuria: Secondary | ICD-10-CM | POA: Diagnosis not present

## 2014-09-26 DIAGNOSIS — E119 Type 2 diabetes mellitus without complications: Secondary | ICD-10-CM | POA: Diagnosis present

## 2014-09-26 DIAGNOSIS — C20 Malignant neoplasm of rectum: Secondary | ICD-10-CM | POA: Diagnosis present

## 2014-09-26 DIAGNOSIS — Z9221 Personal history of antineoplastic chemotherapy: Secondary | ICD-10-CM

## 2014-09-26 DIAGNOSIS — N1330 Unspecified hydronephrosis: Secondary | ICD-10-CM | POA: Diagnosis not present

## 2014-09-26 DIAGNOSIS — Z87440 Personal history of urinary (tract) infections: Secondary | ICD-10-CM | POA: Diagnosis not present

## 2014-09-26 DIAGNOSIS — E876 Hypokalemia: Secondary | ICD-10-CM | POA: Diagnosis not present

## 2014-09-26 DIAGNOSIS — C787 Secondary malignant neoplasm of liver and intrahepatic bile duct: Secondary | ICD-10-CM | POA: Diagnosis present

## 2014-09-26 DIAGNOSIS — N390 Urinary tract infection, site not specified: Secondary | ICD-10-CM | POA: Diagnosis not present

## 2014-09-26 DIAGNOSIS — N179 Acute kidney failure, unspecified: Secondary | ICD-10-CM | POA: Diagnosis present

## 2014-09-26 DIAGNOSIS — C7802 Secondary malignant neoplasm of left lung: Secondary | ICD-10-CM | POA: Diagnosis present

## 2014-09-26 DIAGNOSIS — Z85038 Personal history of other malignant neoplasm of large intestine: Secondary | ICD-10-CM

## 2014-09-26 DIAGNOSIS — D631 Anemia in chronic kidney disease: Secondary | ICD-10-CM

## 2014-09-26 DIAGNOSIS — D638 Anemia in other chronic diseases classified elsewhere: Secondary | ICD-10-CM | POA: Diagnosis present

## 2014-09-26 DIAGNOSIS — Z9049 Acquired absence of other specified parts of digestive tract: Secondary | ICD-10-CM | POA: Diagnosis present

## 2014-09-26 DIAGNOSIS — Z955 Presence of coronary angioplasty implant and graft: Secondary | ICD-10-CM | POA: Diagnosis not present

## 2014-09-26 DIAGNOSIS — K219 Gastro-esophageal reflux disease without esophagitis: Secondary | ICD-10-CM | POA: Diagnosis present

## 2014-09-26 DIAGNOSIS — Z923 Personal history of irradiation: Secondary | ICD-10-CM | POA: Diagnosis not present

## 2014-09-26 DIAGNOSIS — E875 Hyperkalemia: Secondary | ICD-10-CM | POA: Diagnosis present

## 2014-09-26 DIAGNOSIS — R944 Abnormal results of kidney function studies: Secondary | ICD-10-CM | POA: Diagnosis not present

## 2014-09-26 DIAGNOSIS — Y846 Urinary catheterization as the cause of abnormal reaction of the patient, or of later complication, without mention of misadventure at the time of the procedure: Secondary | ICD-10-CM | POA: Diagnosis present

## 2014-09-26 DIAGNOSIS — N3091 Cystitis, unspecified with hematuria: Secondary | ICD-10-CM | POA: Diagnosis present

## 2014-09-26 DIAGNOSIS — I2510 Atherosclerotic heart disease of native coronary artery without angina pectoris: Secondary | ICD-10-CM | POA: Diagnosis present

## 2014-09-26 DIAGNOSIS — R338 Other retention of urine: Secondary | ICD-10-CM | POA: Diagnosis not present

## 2014-09-26 DIAGNOSIS — N319 Neuromuscular dysfunction of bladder, unspecified: Secondary | ICD-10-CM | POA: Diagnosis present

## 2014-09-26 DIAGNOSIS — Z800 Family history of malignant neoplasm of digestive organs: Secondary | ICD-10-CM | POA: Diagnosis not present

## 2014-09-26 DIAGNOSIS — Z833 Family history of diabetes mellitus: Secondary | ICD-10-CM | POA: Diagnosis not present

## 2014-09-26 DIAGNOSIS — N132 Hydronephrosis with renal and ureteral calculous obstruction: Secondary | ICD-10-CM | POA: Diagnosis present

## 2014-09-26 DIAGNOSIS — N3289 Other specified disorders of bladder: Secondary | ICD-10-CM | POA: Diagnosis not present

## 2014-09-26 DIAGNOSIS — T8389XA Other specified complication of genitourinary prosthetic devices, implants and grafts, initial encounter: Principal | ICD-10-CM | POA: Diagnosis present

## 2014-09-26 DIAGNOSIS — N139 Obstructive and reflux uropathy, unspecified: Secondary | ICD-10-CM | POA: Diagnosis present

## 2014-09-26 DIAGNOSIS — T451X5A Adverse effect of antineoplastic and immunosuppressive drugs, initial encounter: Secondary | ICD-10-CM | POA: Diagnosis present

## 2014-09-26 DIAGNOSIS — I129 Hypertensive chronic kidney disease with stage 1 through stage 4 chronic kidney disease, or unspecified chronic kidney disease: Secondary | ICD-10-CM | POA: Diagnosis present

## 2014-09-26 DIAGNOSIS — D696 Thrombocytopenia, unspecified: Secondary | ICD-10-CM | POA: Diagnosis present

## 2014-09-26 HISTORY — DX: Unspecified hydronephrosis: N13.30

## 2014-09-26 HISTORY — PX: CYSTOSCOPY W/ URETERAL STENT PLACEMENT: SHX1429

## 2014-09-26 LAB — CBC WITH DIFFERENTIAL/PLATELET
Basophils Absolute: 0 10*3/uL (ref 0.0–0.1)
Basophils Relative: 0 % (ref 0–1)
EOS ABS: 0.1 10*3/uL (ref 0.0–0.7)
EOS PCT: 1 % (ref 0–5)
HCT: 23.2 % — ABNORMAL LOW (ref 39.0–52.0)
Hemoglobin: 7.8 g/dL — ABNORMAL LOW (ref 13.0–17.0)
LYMPHS PCT: 12 % (ref 12–46)
Lymphs Abs: 0.7 10*3/uL (ref 0.7–4.0)
MCH: 31 pg (ref 26.0–34.0)
MCHC: 33.6 g/dL (ref 30.0–36.0)
MCV: 92.1 fL (ref 78.0–100.0)
MONO ABS: 0.4 10*3/uL (ref 0.1–1.0)
Monocytes Relative: 6 % (ref 3–12)
Neutro Abs: 4.8 10*3/uL (ref 1.7–7.7)
Neutrophils Relative %: 81 % — ABNORMAL HIGH (ref 43–77)
Platelets: 47 10*3/uL — ABNORMAL LOW (ref 150–400)
RBC: 2.52 MIL/uL — AB (ref 4.22–5.81)
RDW: 15.4 % (ref 11.5–15.5)
Smear Review: DECREASED
WBC: 5.9 10*3/uL (ref 4.0–10.5)

## 2014-09-26 LAB — COMPREHENSIVE METABOLIC PANEL
ALK PHOS: 179 U/L — AB (ref 39–117)
ALT: 45 U/L (ref 0–53)
ANION GAP: 7 (ref 5–15)
AST: 49 U/L — AB (ref 0–37)
Albumin: 2.3 g/dL — ABNORMAL LOW (ref 3.5–5.2)
BUN: 76 mg/dL — ABNORMAL HIGH (ref 6–23)
CHLORIDE: 110 mmol/L (ref 96–112)
CO2: 17 mmol/L — ABNORMAL LOW (ref 19–32)
Calcium: 7.9 mg/dL — ABNORMAL LOW (ref 8.4–10.5)
Creatinine, Ser: 3.86 mg/dL — ABNORMAL HIGH (ref 0.50–1.35)
GFR calc Af Amer: 18 mL/min — ABNORMAL LOW (ref >90)
GFR, EST NON AFRICAN AMERICAN: 15 mL/min — AB (ref >90)
Glucose, Bld: 169 mg/dL — ABNORMAL HIGH (ref 70–99)
Potassium: 4.6 mmol/L (ref 3.5–5.1)
SODIUM: 134 mmol/L — AB (ref 135–145)
Total Bilirubin: 1.5 mg/dL — ABNORMAL HIGH (ref 0.3–1.2)
Total Protein: 6.1 g/dL (ref 6.0–8.3)

## 2014-09-26 LAB — URINALYSIS, ROUTINE W REFLEX MICROSCOPIC
GLUCOSE, UA: 250 mg/dL — AB
Nitrite: POSITIVE — AB
Specific Gravity, Urine: 1.02 (ref 1.005–1.030)
UROBILINOGEN UA: 4 mg/dL — AB (ref 0.0–1.0)
pH: 7 (ref 5.0–8.0)

## 2014-09-26 LAB — GLUCOSE, CAPILLARY: Glucose-Capillary: 95 mg/dL (ref 70–99)

## 2014-09-26 LAB — URINE MICROSCOPIC-ADD ON

## 2014-09-26 LAB — CBG MONITORING, ED: Glucose-Capillary: 105 mg/dL — ABNORMAL HIGH (ref 70–99)

## 2014-09-26 SURGERY — CYSTOSCOPY, WITH RETROGRADE PYELOGRAM AND URETERAL STENT INSERTION
Anesthesia: General | Laterality: Bilateral

## 2014-09-26 MED ORDER — LIDOCAINE HCL (CARDIAC) 20 MG/ML IV SOLN
INTRAVENOUS | Status: DC | PRN
  Administered 2014-09-26: 50 mg via INTRAVENOUS

## 2014-09-26 MED ORDER — INSULIN ASPART 100 UNIT/ML ~~LOC~~ SOLN
0.0000 [IU] | SUBCUTANEOUS | Status: DC
  Administered 2014-09-27 – 2014-09-28 (×2): 1 [IU] via SUBCUTANEOUS
  Administered 2014-09-29: 2 [IU] via SUBCUTANEOUS
  Administered 2014-10-02 – 2014-10-10 (×20): 1 [IU] via SUBCUTANEOUS

## 2014-09-26 MED ORDER — PANTOPRAZOLE SODIUM 40 MG PO TBEC
40.0000 mg | DELAYED_RELEASE_TABLET | Freq: Every day | ORAL | Status: DC
  Administered 2014-09-26 – 2014-10-11 (×16): 40 mg via ORAL
  Filled 2014-09-26 (×17): qty 1

## 2014-09-26 MED ORDER — SUCCINYLCHOLINE CHLORIDE 20 MG/ML IJ SOLN
INTRAMUSCULAR | Status: AC
  Filled 2014-09-26: qty 1

## 2014-09-26 MED ORDER — MAGNESIUM OXIDE 400 (241.3 MG) MG PO TABS
400.0000 mg | ORAL_TABLET | Freq: Every day | ORAL | Status: DC
  Administered 2014-09-26 – 2014-10-11 (×16): 400 mg via ORAL
  Filled 2014-09-26 (×17): qty 1

## 2014-09-26 MED ORDER — PHENYLEPHRINE HCL 10 MG/ML IJ SOLN
INTRAMUSCULAR | Status: AC
  Filled 2014-09-26: qty 1

## 2014-09-26 MED ORDER — MORPHINE SULFATE 4 MG/ML IJ SOLN
4.0000 mg | INTRAMUSCULAR | Status: DC | PRN
  Administered 2014-09-26 – 2014-10-02 (×12): 4 mg via INTRAVENOUS
  Filled 2014-09-26 (×13): qty 1

## 2014-09-26 MED ORDER — LIDOCAINE HCL 2 % EX GEL
CUTANEOUS | Status: AC
  Filled 2014-09-26: qty 10

## 2014-09-26 MED ORDER — TEMAZEPAM 15 MG PO CAPS
15.0000 mg | ORAL_CAPSULE | Freq: Every evening | ORAL | Status: DC | PRN
  Administered 2014-09-26 – 2014-09-29 (×2): 15 mg via ORAL
  Administered 2014-10-04: 30 mg via ORAL
  Administered 2014-10-05: 15 mg via ORAL
  Filled 2014-09-26 (×2): qty 2
  Filled 2014-09-26 (×2): qty 1

## 2014-09-26 MED ORDER — LIDOCAINE HCL (PF) 1 % IJ SOLN
INTRAMUSCULAR | Status: AC
  Filled 2014-09-26: qty 5

## 2014-09-26 MED ORDER — FENTANYL CITRATE (PF) 100 MCG/2ML IJ SOLN
INTRAMUSCULAR | Status: AC
  Filled 2014-09-26: qty 2

## 2014-09-26 MED ORDER — PROPOFOL 10 MG/ML IV BOLUS
INTRAVENOUS | Status: AC
  Filled 2014-09-26: qty 20

## 2014-09-26 MED ORDER — ROCURONIUM BROMIDE 50 MG/5ML IV SOLN
INTRAVENOUS | Status: AC
  Filled 2014-09-26: qty 1

## 2014-09-26 MED ORDER — IOHEXOL 350 MG/ML SOLN
INTRAVENOUS | Status: DC | PRN
  Administered 2014-09-26: 10 mL

## 2014-09-26 MED ORDER — ETOMIDATE 2 MG/ML IV SOLN
INTRAVENOUS | Status: AC
  Filled 2014-09-26: qty 10

## 2014-09-26 MED ORDER — ONDANSETRON HCL 4 MG/2ML IJ SOLN
4.0000 mg | Freq: Four times a day (QID) | INTRAMUSCULAR | Status: DC | PRN
  Administered 2014-09-26: 4 mg via INTRAVENOUS

## 2014-09-26 MED ORDER — SODIUM CHLORIDE 0.9 % IV SOLN: INTRAVENOUS | Status: DC

## 2014-09-26 MED ORDER — PHENYLEPHRINE HCL 10 MG/ML IJ SOLN
INTRAMUSCULAR | Status: DC | PRN
  Administered 2014-09-26 (×2): 100 ug via INTRAVENOUS

## 2014-09-26 MED ORDER — EPHEDRINE SULFATE 50 MG/ML IJ SOLN
INTRAMUSCULAR | Status: AC
  Filled 2014-09-26: qty 1

## 2014-09-26 MED ORDER — HYDROCORTISONE 1 % EX CREA
1.0000 "application " | TOPICAL_CREAM | Freq: Two times a day (BID) | CUTANEOUS | Status: DC
  Administered 2014-09-27 – 2014-10-11 (×9): 1 via TOPICAL
  Filled 2014-09-26 (×15): qty 1.5

## 2014-09-26 MED ORDER — ATROPINE SULFATE 0.4 MG/ML IJ SOLN
INTRAMUSCULAR | Status: AC
  Filled 2014-09-26: qty 1

## 2014-09-26 MED ORDER — MIDAZOLAM HCL 2 MG/2ML IJ SOLN
INTRAMUSCULAR | Status: AC
  Filled 2014-09-26: qty 2

## 2014-09-26 MED ORDER — FENTANYL CITRATE (PF) 100 MCG/2ML IJ SOLN
INTRAMUSCULAR | Status: DC | PRN
  Administered 2014-09-26 (×2): 50 ug via INTRAVENOUS
  Administered 2014-09-26 (×2): 25 ug via INTRAVENOUS
  Administered 2014-09-26: 50 ug via INTRAVENOUS

## 2014-09-26 MED ORDER — GLYCOPYRROLATE 0.2 MG/ML IJ SOLN
INTRAMUSCULAR | Status: AC
  Filled 2014-09-26: qty 1

## 2014-09-26 MED ORDER — SODIUM CHLORIDE 0.9 % IR SOLN
Status: DC | PRN
  Administered 2014-09-26 (×2): 3000 mL via INTRAVESICAL

## 2014-09-26 MED ORDER — SUCCINYLCHOLINE CHLORIDE 20 MG/ML IJ SOLN
INTRAMUSCULAR | Status: DC | PRN
  Administered 2014-09-26: 100 mg via INTRAVENOUS

## 2014-09-26 MED ORDER — ONDANSETRON HCL 4 MG PO TABS: 4.0000 mg | ORAL_TABLET | Freq: Four times a day (QID) | ORAL | Status: DC | PRN

## 2014-09-26 MED ORDER — MIDAZOLAM HCL 5 MG/5ML IJ SOLN
INTRAMUSCULAR | Status: DC | PRN
  Administered 2014-09-26 (×2): 1 mg via INTRAVENOUS

## 2014-09-26 MED ORDER — HYDROCODONE-ACETAMINOPHEN 7.5-325 MG PO TABS
1.0000 | ORAL_TABLET | Freq: Four times a day (QID) | ORAL | Status: DC | PRN
  Administered 2014-09-27 – 2014-10-11 (×12): 1 via ORAL
  Filled 2014-09-26 (×12): qty 1

## 2014-09-26 MED ORDER — SODIUM CHLORIDE 0.9 % IJ SOLN
INTRAMUSCULAR | Status: AC
  Filled 2014-09-26: qty 30

## 2014-09-26 MED ORDER — MAGNESIUM OXIDE 400 (241.3 MG) MG PO TABS: 400.0000 mg | ORAL_TABLET | Freq: Every day | ORAL | Status: DC

## 2014-09-26 MED ORDER — PROPOFOL 10 MG/ML IV BOLUS
INTRAVENOUS | Status: DC | PRN
  Administered 2014-09-26: 100 mg via INTRAVENOUS

## 2014-09-26 MED ORDER — ONDANSETRON HCL 4 MG/2ML IJ SOLN
INTRAMUSCULAR | Status: AC
  Filled 2014-09-26: qty 2

## 2014-09-26 MED ORDER — FOLIC ACID 1 MG PO TABS
1.0000 mg | ORAL_TABLET | Freq: Every day | ORAL | Status: DC
  Administered 2014-09-27 – 2014-10-11 (×15): 1 mg via ORAL
  Filled 2014-09-26 (×16): qty 1

## 2014-09-26 SURGICAL SUPPLY — 23 items
BAG DRAIN URO TABLE W/ADPT NS (DRAPE) ×3 IMPLANT
BAG HAMPER (MISCELLANEOUS) ×3 IMPLANT
BAG URINE DRAINAGE (UROLOGICAL SUPPLIES) ×3 IMPLANT
CATH FOLEY 2WAY SLVR  5CC 24FR (CATHETERS) ×2
CATH FOLEY 2WAY SLVR 5CC 24FR (CATHETERS) ×1 IMPLANT
CATH INTERMIT  6FR 70CM (CATHETERS) ×3 IMPLANT
CLOTH BEACON ORANGE TIMEOUT ST (SAFETY) ×3 IMPLANT
DECANTER SPIKE VIAL GLASS SM (MISCELLANEOUS) ×3 IMPLANT
GLOVE BIO SURGEON STRL SZ7.5 (GLOVE) ×3 IMPLANT
GLOVE BIOGEL PI IND STRL 7.0 (GLOVE) ×1 IMPLANT
GLOVE BIOGEL PI IND STRL 7.5 (GLOVE) ×1 IMPLANT
GLOVE BIOGEL PI INDICATOR 7.0 (GLOVE) ×2
GLOVE BIOGEL PI INDICATOR 7.5 (GLOVE) ×2
GOWN STRL REUS W/ TWL LRG LVL3 (GOWN DISPOSABLE) ×2 IMPLANT
GOWN STRL REUS W/TWL LRG LVL3 (GOWN DISPOSABLE) ×4
IV NS IRRIG 3000ML ARTHROMATIC (IV SOLUTION) ×6 IMPLANT
KIT ROOM TURNOVER AP CYSTO (KITS) ×3 IMPLANT
PACK CYSTO (CUSTOM PROCEDURE TRAY) ×3 IMPLANT
PAD ARMBOARD 7.5X6 YLW CONV (MISCELLANEOUS) ×3 IMPLANT
STENT URET 6FRX26 CONTOUR (STENTS) ×6 IMPLANT
SYRINGE IRR TOOMEY STRL 70CC (SYRINGE) ×3 IMPLANT
WATER STERILE IRR 1000ML UROMA (IV SOLUTION) ×3 IMPLANT
ZIPWIRE STRAIGHT .035 X 150CM ×3 IMPLANT

## 2014-09-26 NOTE — Transfer of Care (Signed)
Immediate Anesthesia Transfer of Care Note  Patient: [name]  Procedure(s) Performed: Procedure(s): CYSTOSCOPY WITH CLOT EVACUATION; LEFT URETERAL STENT REMOVAL; BILATERAL RETROGRADE PYELOGRAM; BILATERAL URETERAL STENT PLACEMENT (Bilateral)  Patient Location: PACU  Anesthesia Type:General  Level of Consciousness: awake, alert , oriented and patient cooperative  Airway & Oxygen Therapy: Patient Spontanous Breathing  Post-op Assessment: Report given to RN and Post -op Vital signs reviewed and stable  Post vital signs: Reviewed and stable  Last Vitals:  Filed Vitals:   09/26/14 1730  BP: 102/48  Pulse: 81  Temp:   Resp: 18    Complications: No apparent anesthesia complications

## 2014-09-26 NOTE — Anesthesia Postprocedure Evaluation (Signed)
  Anesthesia Post-op Note  Patient: [name]  Procedure(s) Performed: Procedure(s): CYSTOSCOPY WITH CLOT EVACUATION; LEFT URETERAL STENT REMOVAL; BILATERAL RETROGRADE PYELOGRAM; BILATERAL URETERAL STENT PLACEMENT (Bilateral)  Patient Location: PACU  Anesthesia Type:General  Level of Consciousness: awake, alert , oriented and patient cooperative  Airway and Oxygen Therapy: Patient Spontanous Breathing  Post-op Pain: none  Post-op Assessment: Post-op Vital signs reviewed, Patient's Cardiovascular Status Stable, Respiratory Function Stable, Patent Airway, No signs of Nausea or vomiting and Pain level controlled  Post-op Vital Signs: Reviewed and stable  Last Vitals:  Filed Vitals:   09/26/14 1955  BP: 105/44  Pulse: 84  Temp: 36.5 C  Resp: 15    Complications: No apparent anesthesia complications

## 2014-09-26 NOTE — Brief Op Note (Signed)
09/26/2014  7:16 PM  PATIENT:  [name]  64 y.o. male  PRE-OPERATIVE DIAGNOSIS:  Renal Failure  POST-OPERATIVE DIAGNOSIS:  Renal Failure  PROCEDURE:  Procedure(s): CYSTOSCOPY WITH CLOT EVACUATION; LEFT URETERAL STENT REMOVAL; BILATERAL RETROGRADE PYELOGRAM; BILATERAL URETERAL STENT PLACEMENT (Bilateral)  SURGEON:  Surgeon(s) and Role:    * Alexis Frock, MD - Primary  PHYSICIAN ASSISTANT:   ASSISTANTS: none   ANESTHESIA:   general  EBL:     BLOOD ADMINISTERED:none  DRAINS: 88F foley to straight drain   LOCAL MEDICATIONS USED:  NONE  SPECIMEN:  No Specimen  DISPOSITION OF SPECIMEN:  N/A  COUNTS:  YES  TOURNIQUET:  * No tourniquets in log *  DICTATION: .Other Dictation: Dictation Number [id number]  PLAN OF CARE: Admit to inpatient   PATIENT DISPOSITION:  PACU - hemodynamically stable.   Delay start of Pharmacological VTE agent (>24hrs) due to surgical blood loss or risk of bleeding: yes

## 2014-09-26 NOTE — Anesthesia Preprocedure Evaluation (Addendum)
Anesthesia Evaluation  Patient identified by MRN, date of birth, ID band Patient awake    Reviewed: Allergy & Precautions, NPO status , Patient's Chart, lab work & pertinent test results, reviewed documented beta blocker date and time   Airway Mallampati: II  TM Distance: >3 FB     Dental  (+) Poor Dentition, Chipped, Missing   Pulmonary shortness of breath, sleep apnea , former smoker,  breath sounds clear to auscultation        Cardiovascular hypertension, + CAD and + Past MI Rhythm:regular     Neuro/Psych    GI/Hepatic GERD-  ,(+) Cirrhosis -      ,   Endo/Other  diabetes  Renal/GU ARFRenal disease     Musculoskeletal   Abdominal   Peds  Hematology  (+) anemia ,   Anesthesia Other Findings   Reproductive/Obstetrics                           Anesthesia Physical Anesthesia Plan  ASA: IV and emergent  Anesthesia Plan: General ETT, Rapid Sequence and Cricoid Pressure   Post-op Pain Management:    Induction:   Airway Management Planned:   Additional Equipment:   Intra-op Plan:   Post-operative Plan:   Informed Consent: I have reviewed the patients History and Physical, chart, labs and discussed the procedure including the risks, benefits and alternatives for the proposed anesthesia with the patient or authorized representative who has indicated his/her understanding and acceptance.   Dental Advisory Given  Plan Discussed with: Surgeon  Anesthesia Plan Comments:        Anesthesia Quick Evaluation

## 2014-09-26 NOTE — Telephone Encounter (Signed)
Hassan Rowan from [facility] called to update me on some recent changes in Adeeb's blood work.  They follow below.  Lab Results  Component Value Date   CREATININE 4.25* 09/25/2014   CREATININE 1.84* 09/15/2014   CREATININE 1.53* 09/01/2014   His creatinine has increased significantly in a short interval.  He is on single agent Cetuximab which should not affect renal insufficiency.  From an oncology standpoint, I have no explanation for this and therefore, I will request nursing call the patient and request him to report to the ED.    Hassan Rowan reports that Dr. Jeffie Pollock is at the [place] office today [phone]).  He needs a renal work-up and the Cancer Clinic is not the appropriate place for this work-up.  KEFALAS,THOMAS 09/26/2014 9:07 AM

## 2014-09-26 NOTE — ED Notes (Signed)
Report given to [facility], receiving RN post OR. Pt aware of procedure. Blood consent being obtained.

## 2014-09-26 NOTE — ED Notes (Signed)
Pt sent to ED by oncologist, pt's labs were of concern, pt states it was about his kidney function, BUN 73.

## 2014-09-26 NOTE — Telephone Encounter (Deleted)
Per Robynn Pane, PA-C, called pt and told pt to go directly to ED for eval of creatinine level.  Pt verbalized understanding and states he would try to get there within the next 20 min.

## 2014-09-26 NOTE — Anesthesia Procedure Notes (Signed)
Procedure Name: Intubation Date/Time: 09/26/2014 6:33 PM Performed by: Andree Elk, Erskine Steinfeldt A Pre-anesthesia Checklist: Patient identified, Patient being monitored, Timeout performed, Emergency Drugs available and Suction available Patient Re-evaluated:Patient Re-evaluated prior to inductionOxygen Delivery Method: Circle System Utilized Preoxygenation: Pre-oxygenation with 100% oxygen Intubation Type: IV induction, Rapid sequence and Cricoid Pressure applied Laryngoscope Size: 3 and [place] Grade View: Grade I Tube type: Oral Tube size: 7.0 mm Number of attempts: 1 Airway Equipment and Method: Stylet Placement Confirmation: ETT inserted through vocal cords under direct vision,  positive ETCO2 and breath sounds checked- equal and bilateral Secured at: 21 cm Tube secured with: Tape Dental Injury: Teeth and Oropharynx as per pre-operative assessment

## 2014-09-26 NOTE — H&P (Signed)
[facility] History and Physical  [name] MVE:[id number] DOB: Oct 12, 1950 DOA: 09/26/2014  Referring physician: ER PCP: [name], MD   Chief Complaint: Abnormal lab.  HPI: [name] is a 64 y.o. male  This is a 64 year old man who was told to come to the emergency room because he was found to have an abnormal lab. He was having preoperative blood work done for renal procedure. He was found to have a significantly elevated creatinine. The patient has an indwelling catheter and a left ureteral stent. It appears the stent is causing some problems and subsequent hydronephrosis. This is why his lab work is abnormal. The patient himself denies any symptoms such as nausea, vomiting, abdominal pain or fever. He is now being admitted for urgent surgery hopefully this evening.   Review of Systems:  Apart from symptoms above, all systems negative.   Past Medical History  Diagnosis Date  . Pancytopenia, acquired 12/04/2010  . B12 deficiency 12/07/2010  . Kidney stones   . CAD (coronary artery disease)   . Anemia   . History of radiation therapy 06/19/07 -07/27/07    whole pelvis  . Hx of radiation therapy 08/06/12,08/09/12,& 08/13/12    rul 54GY/74f  . Cancer     Colon ca dx 2008 surg/rad/chemo  . Lung cancer   . FH: chemotherapy   . Rectal bleeding 09/20/2013    From recurrent rectal mass/recurrent rectal adenocarcinoma  . Acute blood loss anemia 09/27/2013  . 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  2.7 cm primary, moderately differentiated with 3/8 positive lymph nodes without LVI.  Surgery was on 04/22/2007 and was treated postoperatively with radiation and concomitant capecitabine and then 4 cycles of a planned 6 with oxaliplatin and capecitabine.  His counts would not allow treatment of  . Hydronephrosis of left kidney 09/24/2013    Kidney stones; s/p cystoscopy and  stent  . Gross hematuria 09/25/2103  . Lung metastases 07/09/2013  . Secondary malignancy of right lung 07/09/2013  . Insomnia 06/09/2014  . Foley catheter in place 07/02/14  . Broken collarbone feb 2016  . Myocardial infarction     2003   . Heart murmur   . Sleep apnea     hx of cpap no longer wears   . Diabetes mellitus without complication     hx of no longer on meds   . Hypertension     hx of   . Shortness of breath dyspnea     due to radiaiton treatment   . GERD (gastroesophageal reflux disease)    Past Surgical History  Procedure Laterality Date  . Ileostomy  12/07/2010    Procedure: ILEOSTOMY;  Surgeon: BDonato Heinz  Location: [facility];  Service: General;  Laterality: N/A;  Diverting Ileostomy Lysis of adhesions Exploratory Laparotomy  . Lung biopsy  10/27/10    rt lobe- adenocarcinoma  . Cholecystectomy    . Cataract extraction w/ intraocular lens implant  2007    bil  . Lithotripsy  2002  . Coronary angioplasty with stent placement  2003    stenting x 2  . Colon resection  2008    x2  . Esophagogastroduodenoscopy N/A 05/02/2013    Dr. RGala Romney polypoid antral fold, bx= reactive gastropathy  . Givens capsule study N/A 05/02/2013    Procedure: GIVENS CAPSULE STUDY;  Surgeon: RDaneil Dolin MD;  Location: [facility]  ENDO SUITE;  Service: Endoscopy;  Laterality: N/A;  . Colonoscopy N/A 09/20/2013    SLF:A near circumferential fungating mass with friable surfaces was found in the rectum.  ACTIVELY OOZING.  CLOTS SEEN IN LUMEN AND ASPIRATED.  Multiple biopsies were performed using cold forceps.  Thermal therapy(BICAP 7 Fr 25W) was used to ATTEMPT TO control bleeding.    HEMOSTASIS NOT ACHEIVED.  Marland Kitchen Cystoscopy w/ ureteral stent placement Left 09/24/2013    Procedure: CYSTOSCOPY WITH LEFT RETROGRADE PYELOGRAM; LEFT URETERAL STENT PLACEMENT; REMOVAL OF SMALL BLADDER CALCULI;  Surgeon: Marissa Nestle, MD;  Location: [facility];  Service: Urology;  Laterality: Left;  . Flexible sigmoidoscopy N/A  09/25/2013    LZJ:[id number] exophytic rectal mass most likely representing recurrent adenocarcinoma status post biopsy  . Cystoscopy with retrograde pyelogram, ureteroscopy and stent placement Left 05/08/2014    Procedure: CYSTOSCOPY/ EVACUATION OF CLOTS  WITH LEFT RETROGRADE PYELOGRAM,LEFT URETEROSCOPY ;  Surgeon: Malka So, MD;  Location: [facility];  Service: Urology;  Laterality: Left;  . Nephrolithotomy Left 05/13/2014    Procedure: LEFT NEPHROLITHOTOMY PERCUTANEOUS FIRST STAGE;  Surgeon: Malka So, MD;  Location: [facility];  Service: Urology;  Laterality: Left;  . Holmium laser application Left 02/40/9735    Procedure: HOLMIUM LASER APPLICATION;  Surgeon: Malka So, MD;  Location: [facility];  Service: Urology;  Laterality: Left;   Social History:  reports that he has quit smoking. His smoking use included Cigarettes and Cigars. He has a 60 pack-year smoking history. He quit smokeless tobacco use about 9 years ago. His smokeless tobacco use included Chew. He reports that he does not drink alcohol or use illicit drugs.  Allergies  Allergen Reactions  . Daypro [Oxaprozin] Nausea And Vomiting  . Xeloda [Capecitabine] Other (See Comments)    Homocidal and suicidal ideations    Family History  Problem Relation Age of Onset  . Cervical cancer Mother   . Rectal cancer Father 104  . Uterine cancer Sister     Prior to Admission medications   Medication Sig Start Date End Date Taking? Authorizing Provider  ciprofloxacin (CIPRO) 250 MG tablet Take 250 mg by mouth 2 (two) times daily.   Yes Historical Provider, MD  cyanocobalamin (,VITAMIN B-12,) 1000 MCG/ML injection Inject 1,000 mcg into the muscle every 30 (thirty) days.    Yes Historical Provider, MD  folic acid (FOLVITE) 1 MG tablet Take 1 tablet (1 mg total) by mouth daily. 04/28/14  Yes Baird Cancer, PA-C  HYDROcodone-acetaminophen (NORCO) 7.5-325 MG per tablet Take 1 tablet by mouth every 6 (six) hours as needed for moderate pain.  09/15/14  Yes Baird Cancer, PA-C  hydrocortisone cream 1 % Apply 1 application topically 2 (two) times daily. Mix with urea cream and apply to affected areas. 04/23/14  Yes Baird Cancer, PA-C  lidocaine-prilocaine (EMLA) cream Apply a quarter size amount to port site 1 hour prior to chemo. Do not rub in. Cover with plastic wrap. 03/13/14  Yes Manon Hilding Kefalas, PA-C  magnesium oxide (MAG-OX) 400 (241.3 MG) MG tablet Take 1 tablet (400 mg total) by mouth daily. Patient taking differently: Take 400 mg by mouth daily. Patient takes at [facility] [place] 09/08/14  Yes Patrici Ranks, MD  omeprazole (PRILOSEC) 20 MG capsule Take 1 capsule (20 mg total) by mouth daily. 09/28/13  Yes Rexene Alberts, MD  ondansetron (ZOFRAN) 8 MG tablet Take 1 tablet (8 mg total) by mouth 2 (two) times daily as needed for nausea or vomiting.  03/13/14  Yes Manon Hilding Kefalas, PA-C  temazepam (RESTORIL) 15 MG capsule Take 1-2 capsules (15-30 mg total) by mouth at bedtime as needed for sleep. 06/09/14  Yes Manon Hilding Kefalas, PA-C  urea (CARMOL) 10 % cream Apply topically as needed. Mix with hydrocortisone cream and apply to affected areas. 04/23/14  Yes Baird Cancer, PA-C   Physical Exam: Filed Vitals:   09/26/14 1500 09/26/14 1526 09/26/14 1530 09/26/14 1600  BP: 97/41 1'01/51 96/50 92/45 '$  Pulse: 74 74 74 73  Temp:  97.5 F (36.4 C)    TempSrc:  Oral    Resp: '15 16 14 14  '$ Height:      Weight:      SpO2: 100% 100% 100% 100%    Wt Readings from Last 3 Encounters:  09/26/14 87.091 kg (192 lb)  09/23/14 92.352 kg (203 lb 9.6 oz)  09/15/14 92.352 kg (203 lb 9.6 oz)    General:  Appears chronically sick and somewhat dehydrated. However, he is not toxic or septic. Eyes: PERRL, normal lids, irises & conjunctiva ENT: grossly normal hearing, lips & tongue Neck: no LAD, masses or thyromegaly Cardiovascular: RRR, no m/r/g. No LE edema. Telemetry: SR, no arrhythmias  Respiratory: CTA bilaterally, no w/r/r. Normal respiratory  effort. Abdomen: soft, ntnd Skin: no rash or induration seen on limited exam Musculoskeletal: grossly normal tone BUE/BLE Psychiatric: grossly normal mood and affect, speech fluent and appropriate Neurologic: grossly non-focal.          Labs on Admission:  Basic Metabolic Panel:  Recent Labs Lab 09/25/14 0840 09/26/14 1230  NA 135 134*  K 5.6* 4.6  CL 108 110  CO2 15* 17*  GLUCOSE 101* 169*  BUN 78* 76*  CREATININE 4.25* 3.86*  CALCIUM 8.6 7.9*   Liver Function Tests:  Recent Labs Lab 09/25/14 0840 09/26/14 1230  AST 53* 49*  ALT 47 45  ALKPHOS 210* 179*  BILITOT 2.3* 1.5*  PROT 7.0 6.1  ALBUMIN 2.8* 2.3*   No results for input(s): LIPASE, AMYLASE in the last 168 hours. No results for input(s): AMMONIA in the last 168 hours. CBC:  Recent Labs Lab 09/25/14 0840 09/26/14 1230  WBC 9.7 5.9  NEUTROABS  --  4.8  HGB 9.4* 7.8*  HCT 27.7* 23.2*  MCV 93.3 92.1  PLT 78* 47*   Cardiac Enzymes: No results for input(s): CKTOTAL, CKMB, CKMBINDEX, TROPONINI in the last 168 hours.  BNP (last 3 results) No results for input(s): BNP in the last 8760 hours.  ProBNP (last 3 results) No results for input(s): PROBNP in the last 8760 hours.  CBG: No results for input(s): GLUCAP in the last 168 hours.  Radiological Exams on Admission: Ct Abdomen Pelvis Wo Contrast  09/26/2014   CLINICAL DATA:  Left renal stent placement.  Elevated creatinine.  EXAM: CT ABDOMEN AND PELVIS WITHOUT CONTRAST  TECHNIQUE: Multidetector CT imaging of the abdomen and pelvis was performed following the standard protocol without IV contrast.  COMPARISON:  CT 05/14/2014  FINDINGS: Small nodules peripherally in the right middle lobe are stable since prior study. No pleural effusions. Heart is normal size. Coronary artery calcifications noted.  Prior cholecystectomy. Liver, pancreas, adrenals are unremarkable. Spleen is enlarged with a craniocaudal length of 14.8 cm compared with 12.9 cm previously.   There is severe left hydronephrosis. Proximal end of the left ureteral stent is within the proximal left ureter. The catheter loops in the urinary bladder. There are locules of gas noted dependently and non dependently within  the urinary bladder. Foley catheter is in place. There are also locules of gas within the dilated left renal collecting system. Question related to instrumentation/catheterization of the bladder. Cannot exclude pyonephrosis.  There is mild right hydronephrosis, new since prior study. Bilateral low-density areas within the kidneys likely represent cysts. Punctate nonobstructing right renal stones noted. No ureteral stones.  Postoperative changes from prior subtotal colectomy. Right lower quadrant ostomy noted. There are herniated small bowel loops through the stomal defect into the anterior abdominal wall, similar to prior study. No evidence of bowel obstruction.  Aorta is normal caliber.  No free fluid, free air or adenopathy.  No acute bony abnormality or focal bone lesion.  IMPRESSION: Severe left hydronephrosis. The left ureteral stent loops in the bladder distally with the proximal end of the stent in the proximal left ureter.  Gas within the urinary bladder and left renal collecting system. This may be related to bladder instrumentation/ catheterization, but cannot exclude pyonephrosis.  New mild right hydronephrosis.  Right nephrolithiasis.  Right lower quadrant ostomy again noted with parastomal herniation of small bowel loops. No obstruction.  Increasing splenomegaly.   Electronically Signed   By: Rolm Baptise M.D.   On: 09/26/2014 16:09      Assessment/Plan   1. Acute renal failure. This is secondary to severe left hydronephrosis. The left ureteral stent loops in the bladder distally with the proximal end of the stent in the proximal left ureter. This will require fairly urgent surgery/procedure. Urology is aware of this and plan to do the procedure fairly soon. We will give  him intravenous fluids and keep him nothing by mouth. 2. Diabetes. Sliding scale of insulin. 3. 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  Further recommendations will depend on patient's hospital progress.   Code Status: Full code.  DVT Prophylaxis: SCDs.  Family Communication: I discussed the plan with the patient at the bedside.   Disposition Plan: Home when medically stable.   Time spent: 45 minutes.  Doree Albee [facility] Pager [phone].

## 2014-09-26 NOTE — Telephone Encounter (Signed)
Per Robynn Pane, PA-C, called pt and told pt to go directly to ED for eval of creatinine level.  Pt verbalized understanding and states he would try to get there within the next 20 min.

## 2014-09-26 NOTE — Consult Note (Signed)
Reason for Consult: Acute Renal Failure, Lt > Rt Hydronephrosis, Bacteruria  Referring Physician: Matilde Haymaker MD  [name] is an 64 y.o. male.   HPI:   1 - Acute Renal Failure - Cr 4 on ER labs on eval malaise up from baseline of about 1.5. No hyperkalemis.  2 -  Lt > Rt Hydronephrosis - chronic hydro on left likelyk 2/2 combinatino priro stones and malignant obstruction / post radiation srictrue. Acutely worse by ER CT with malposition of left ureteral stent not in renal pelvis and new right hydro.  3 - Bacteruria - pt with chronic foley at baseline. Klebsiella bacteruria by most recent CX last week and has been on CX-specific Cipro.   4- Neurogenic Bladder - manages with chronic foley changed Q monthly.   Today Neev is seen as emergent consult for above. He was to have his left stent changes next week, but now with acute worsening in GFR and malposition of current stent. Chronic anemia and low platelets noted.   Past Medical History  Diagnosis Date  . Pancytopenia, acquired 12/04/2010  . B12 deficiency 12/07/2010  . Kidney stones   . CAD (coronary artery disease)   . Anemia   . History of radiation therapy 06/19/07 -07/27/07    whole pelvis  . Hx of radiation therapy 08/06/12,08/09/12,& 08/13/12    rul 54GY/46f  . Cancer     Colon ca dx 2008 surg/rad/chemo  . Lung cancer   . FH: chemotherapy   . Rectal bleeding 09/20/2013    From recurrent rectal mass/recurrent rectal adenocarcinoma  . Acute blood loss anemia 09/27/2013  . 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  2.7 cm primary, moderately differentiated with 3/8 positive lymph nodes without LVI.  Surgery was on 04/22/2007 and was treated postoperatively with radiation and concomitant capecitabine and then 4 cycles of a planned 6 with oxaliplatin and capecitabine.  His counts would not allow treatment of  . Hydronephrosis of left  kidney 09/24/2013    Kidney stones; s/p cystoscopy and stent  . Gross hematuria 09/25/2103  . Lung metastases 07/09/2013  . Secondary malignancy of right lung 07/09/2013  . Insomnia 06/09/2014  . Foley catheter in place 07/02/14  . Broken collarbone feb 2016  . Myocardial infarction     2003   . Heart murmur   . Sleep apnea     hx of cpap no longer wears   . Diabetes mellitus without complication     hx of no longer on meds   . Hypertension     hx of   . Shortness of breath dyspnea     due to radiaiton treatment   . GERD (gastroesophageal reflux disease)     Past Surgical History  Procedure Laterality Date  . Ileostomy  12/07/2010    Procedure: ILEOSTOMY;  Surgeon: BDonato Heinz  Location: [facility];  Service: General;  Laterality: N/A;  Diverting Ileostomy Lysis of adhesions Exploratory Laparotomy  . Lung biopsy  10/27/10    rt lobe- adenocarcinoma  . Cholecystectomy    . Cataract extraction w/ intraocular lens implant  2007    bil  . Lithotripsy  2002  . Coronary angioplasty with stent placement  2003    stenting x 2  . Colon resection  2008    x2  . Esophagogastroduodenoscopy N/A 05/02/2013    Dr. RGala Romney polypoid antral fold, bx=  reactive gastropathy  . Givens capsule study N/A 05/02/2013    Procedure: GIVENS CAPSULE STUDY;  Surgeon: Daneil Dolin, MD;  Location: [facility];  Service: Endoscopy;  Laterality: N/A;  . Colonoscopy N/A 09/20/2013    SLF:A near circumferential fungating mass with friable surfaces was found in the rectum.  ACTIVELY OOZING.  CLOTS SEEN IN LUMEN AND ASPIRATED.  Multiple biopsies were performed using cold forceps.  Thermal therapy(BICAP 7 Fr 25W) was used to ATTEMPT TO control bleeding.    HEMOSTASIS NOT ACHEIVED.  Marland Kitchen Cystoscopy w/ ureteral stent placement Left 09/24/2013    Procedure: CYSTOSCOPY WITH LEFT RETROGRADE PYELOGRAM; LEFT URETERAL STENT PLACEMENT; REMOVAL OF SMALL BLADDER CALCULI;  Surgeon: Marissa Nestle, MD;  Location: [facility];  Service:  Urology;  Laterality: Left;  . Flexible sigmoidoscopy N/A 09/25/2013    GMW:[id number] exophytic rectal mass most likely representing recurrent adenocarcinoma status post biopsy  . Cystoscopy with retrograde pyelogram, ureteroscopy and stent placement Left 05/08/2014    Procedure: CYSTOSCOPY/ EVACUATION OF CLOTS  WITH LEFT RETROGRADE PYELOGRAM,LEFT URETEROSCOPY ;  Surgeon: Malka So, MD;  Location: [facility];  Service: Urology;  Laterality: Left;  . Nephrolithotomy Left 05/13/2014    Procedure: LEFT NEPHROLITHOTOMY PERCUTANEOUS FIRST STAGE;  Surgeon: Malka So, MD;  Location: [facility];  Service: Urology;  Laterality: Left;  . Holmium laser application Left 44/07/4740    Procedure: HOLMIUM LASER APPLICATION;  Surgeon: Malka So, MD;  Location: [facility];  Service: Urology;  Laterality: Left;    Family History  Problem Relation Age of Onset  . Cervical cancer Mother   . Rectal cancer Father 31  . Uterine cancer Sister     Social History:  reports that he has quit smoking. His smoking use included Cigarettes and Cigars. He has a 60 pack-year smoking history. He quit smokeless tobacco use about 9 years ago. His smokeless tobacco use included Chew. He reports that he does not drink alcohol or use illicit drugs.  Allergies:  Allergies  Allergen Reactions  . Daypro [Oxaprozin] Nausea And Vomiting  . Xeloda [Capecitabine] Other (See Comments)    Homocidal and suicidal ideations    Medications: I have reviewed the patient's current medications.  Results for orders placed or performed during the hospital encounter of 09/26/14 (from the past 48 hour(s))  CBC with Differential/Platelet     Status: Abnormal   Collection Time: 09/26/14 12:30 PM  Result Value Ref Range   WBC 5.9 4.0 - 10.5 K/uL   RBC 2.52 (L) 4.22 - 5.81 MIL/uL   Hemoglobin 7.8 (L) 13.0 - 17.0 g/dL   HCT 23.2 (L) 39.0 - 52.0 %   MCV 92.1 78.0 - 100.0 fL   MCH 31.0 26.0 - 34.0 pg   MCHC 33.6 30.0 - 36.0 g/dL   RDW 15.4 11.5 -  15.5 %   Platelets 47 (L) 150 - 400 K/uL    Comment: SPECIMEN CHECKED FOR CLOTS PLATELET COUNT CONFIRMED BY SMEAR    Neutrophils Relative % 81 (H) 43 - 77 %   Neutro Abs 4.8 1.7 - 7.7 K/uL   Lymphocytes Relative 12 12 - 46 %   Lymphs Abs 0.7 0.7 - 4.0 K/uL   Monocytes Relative 6 3 - 12 %   Monocytes Absolute 0.4 0.1 - 1.0 K/uL   Eosinophils Relative 1 0 - 5 %   Eosinophils Absolute 0.1 0.0 - 0.7 K/uL   Basophils Relative 0 0 - 1 %   Basophils Absolute 0.0 0.0 -  0.1 K/uL   Smear Review PLATELETS APPEAR DECREASED   Comprehensive metabolic panel     Status: Abnormal   Collection Time: 09/26/14 12:30 PM  Result Value Ref Range   Sodium 134 (L) 135 - 145 mmol/L   Potassium 4.6 3.5 - 5.1 mmol/L   Chloride 110 96 - 112 mmol/L   CO2 17 (L) 19 - 32 mmol/L   Glucose, Bld 169 (H) 70 - 99 mg/dL   BUN 76 (H) 6 - 23 mg/dL   Creatinine, Ser 3.86 (H) 0.50 - 1.35 mg/dL   Calcium 7.9 (L) 8.4 - 10.5 mg/dL   Total Protein 6.1 6.0 - 8.3 g/dL   Albumin 2.3 (L) 3.5 - 5.2 g/dL   AST 49 (H) 0 - 37 U/L   ALT 45 0 - 53 U/L   Alkaline Phosphatase 179 (H) 39 - 117 U/L   Total Bilirubin 1.5 (H) 0.3 - 1.2 mg/dL   GFR calc non Af Amer 15 (L) >90 mL/min   GFR calc Af Amer 18 (L) >90 mL/min    Comment: (NOTE) The eGFR has been calculated using the CKD EPI equation. This calculation has not been validated in all clinical situations. eGFR's persistently <90 mL/min signify possible Chronic Kidney Disease.    Anion gap 7 5 - 15  Urinalysis, Routine w reflex microscopic     Status: Abnormal   Collection Time: 09/26/14  1:00 PM  Result Value Ref Range   Color, Urine BROWN (A) YELLOW    Comment: BIOCHEMICALS MAY BE AFFECTED BY COLOR   APPearance TURBID (A) CLEAR   Specific Gravity, Urine 1.020 1.005 - 1.030   pH 7.0 5.0 - 8.0   Glucose, UA 250 (A) NEGATIVE mg/dL   Hgb urine dipstick LARGE (A) NEGATIVE   Bilirubin Urine LARGE (A) NEGATIVE   Ketones, ur TRACE (A) NEGATIVE mg/dL   Protein, ur >300 (A)  NEGATIVE mg/dL   Urobilinogen, UA 4.0 (H) 0.0 - 1.0 mg/dL   Nitrite POSITIVE (A) NEGATIVE   Leukocytes, UA MODERATE (A) NEGATIVE  Urine microscopic-add on     Status: Abnormal   Collection Time: 09/26/14  1:00 PM  Result Value Ref Range   WBC, UA 11-20 <3 WBC/hpf   RBC / HPF TOO NUMEROUS TO COUNT <3 RBC/hpf   Bacteria, UA MANY (A) RARE    Ct Abdomen Pelvis Wo Contrast  09/26/2014   CLINICAL DATA:  Left renal stent placement.  Elevated creatinine.  EXAM: CT ABDOMEN AND PELVIS WITHOUT CONTRAST  TECHNIQUE: Multidetector CT imaging of the abdomen and pelvis was performed following the standard protocol without IV contrast.  COMPARISON:  CT 05/14/2014  FINDINGS: Small nodules peripherally in the right middle lobe are stable since prior study. No pleural effusions. Heart is normal size. Coronary artery calcifications noted.  Prior cholecystectomy. Liver, pancreas, adrenals are unremarkable. Spleen is enlarged with a craniocaudal length of 14.8 cm compared with 12.9 cm previously.  There is severe left hydronephrosis. Proximal end of the left ureteral stent is within the proximal left ureter. The catheter loops in the urinary bladder. There are locules of gas noted dependently and non dependently within the urinary bladder. Foley catheter is in place. There are also locules of gas within the dilated left renal collecting system. Question related to instrumentation/catheterization of the bladder. Cannot exclude pyonephrosis.  There is mild right hydronephrosis, new since prior study. Bilateral low-density areas within the kidneys likely represent cysts. Punctate nonobstructing right renal stones noted. No ureteral stones.  Postoperative changes  from prior subtotal colectomy. Right lower quadrant ostomy noted. There are herniated small bowel loops through the stomal defect into the anterior abdominal wall, similar to prior study. No evidence of bowel obstruction.  Aorta is normal caliber.  No free fluid, free  air or adenopathy.  No acute bony abnormality or focal bone lesion.  IMPRESSION: Severe left hydronephrosis. The left ureteral stent loops in the bladder distally with the proximal end of the stent in the proximal left ureter.  Gas within the urinary bladder and left renal collecting system. This may be related to bladder instrumentation/ catheterization, but cannot exclude pyonephrosis.  New mild right hydronephrosis.  Right nephrolithiasis.  Right lower quadrant ostomy again noted with parastomal herniation of small bowel loops. No obstruction.  Increasing splenomegaly.   Electronically Signed   By: Rolm Baptise M.D.   On: 09/26/2014 16:09    Review of Systems  Constitutional: Positive for malaise/fatigue.  HENT: Negative.   Eyes: Negative.   Respiratory: Negative.   Cardiovascular: Negative.   Gastrointestinal: Negative.   Genitourinary: Positive for hematuria. Negative for flank pain.  Musculoskeletal: Negative.   Skin: Negative.   Neurological: Negative.   Endo/Heme/Allergies: Negative.   Psychiatric/Behavioral: Negative.    Blood pressure 102/48, pulse 81, temperature 97.5 F (36.4 C), temperature source Oral, resp. rate 18, height 5' 6"  (1.676 m), weight 87.091 kg (192 lb), SpO2 100 %. Physical Exam  Constitutional: He appears well-developed.  Chronically ill appearing, at baseline. Wife at bedside.  HENT:  Head: Normocephalic.  Eyes: Pupils are equal, round, and reactive to light.  Neck: Normal range of motion.  Cardiovascular: Normal rate.   Respiratory: Effort normal.  GI:  Colostomy patent.   Genitourinary: Penis normal.  Foley c/d/i with dark urine  Musculoskeletal: Normal range of motion.  Neurological: He is alert.  Skin: Skin is warm.  Psychiatric: He has a normal mood and affect.    Assessment/Plan:  1 - Acute Renal Failure - likely obstructive. Discussed options of palliative only therapy v. Renal decompression from bilateral stents v. neph tubes. He wants  trial of bilat stents. Risks, benefits, alternatives discussed. Rec hospitalist admittion post-op to verify GFR recovery before DC. We will make Dr. Jeffie Pollock aware of his admission.   2 -  Lt > Rt Hydronephrosis - proceed as per abovoe.   3 - Bacteruria - has been on CX-specific therapy. DO not rec further ABX given GFR decline unless infectious parameters worsen.   4- Neurogenic Bladder - continue foley, will change at procedure today.     Marypat Kimmet 09/26/2014, 5:39 PM

## 2014-09-26 NOTE — ED Provider Notes (Addendum)
CSN: [id number]     Arrival date & time 09/26/14  1034 History  This chart was scribed for Orpah Greek, MD by Hilda Lias, ED Scribe. This patient was seen in room [facility] and the patient's care was started at 11:28 AM.  Chief Complaint  Patient presents with  . Abnormal Lab     The history is provided by the patient. No language interpreter was used.     HPI Comments: [name] is a 64 y.o. male who presents to the Emergency Department complaining of an abnormal lab that he received from blood work he had done yesterday. Pt states that he was seen yesterday for a pre-operative procedure to his kidneys where blood work was taken. Pt was advised this morning to come to ED by office which he saw yesterday for pre-operative procedure. Pt currently has an indwelling catheter and has a left ureteral stent.     Past Medical History  Diagnosis Date  . Pancytopenia, acquired 12/04/2010  . B12 deficiency 12/07/2010  . Kidney stones   . CAD (coronary artery disease)   . Anemia   . History of radiation therapy 06/19/07 -07/27/07    whole pelvis  . Hx of radiation therapy 08/06/12,08/09/12,& 08/13/12    rul 56GY/70f  . Cancer     Colon ca dx 2008 surg/rad/chemo  . Lung cancer   . FH: chemotherapy   . Rectal bleeding 09/20/2013    From recurrent rectal mass/recurrent rectal adenocarcinoma  . Acute blood loss anemia 09/27/2013  . 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  2.7 cm primary, moderately differentiated with 3/8 positive lymph nodes without LVI.  Surgery was on 04/22/2007 and was treated postoperatively with radiation and concomitant capecitabine and then 4 cycles of a planned 6 with oxaliplatin and capecitabine.  His counts would not allow treatment of  . Hydronephrosis of left kidney 09/24/2013    Kidney stones; s/p cystoscopy and stent  . Gross hematuria 09/25/2103  . Lung  metastases 07/09/2013  . Secondary malignancy of right lung 07/09/2013  . Insomnia 06/09/2014  . Foley catheter in place 07/02/14  . Broken collarbone feb 2016  . Myocardial infarction     2003   . Heart murmur   . Sleep apnea     hx of cpap no longer wears   . Diabetes mellitus without complication     hx of no longer on meds   . Hypertension     hx of   . Shortness of breath dyspnea     due to radiaiton treatment   . GERD (gastroesophageal reflux disease)    Past Surgical History  Procedure Laterality Date  . Ileostomy  12/07/2010    Procedure: ILEOSTOMY;  Surgeon: BDonato Heinz  Location: [facility];  Service: General;  Laterality: N/A;  Diverting Ileostomy Lysis of adhesions Exploratory Laparotomy  . Lung biopsy  10/27/10    rt lobe- adenocarcinoma  . Cholecystectomy    . Cataract extraction w/ intraocular lens implant  2007    bil  . Lithotripsy  2002  . Coronary angioplasty with stent placement  2003    stenting x 2  . Colon resection  2008    x2  . Esophagogastroduodenoscopy N/A 05/02/2013    Dr. RGala Romney polypoid antral fold, bx= reactive gastropathy  . Givens capsule study N/A 05/02/2013    Procedure: GIVENS CAPSULE STUDY;  Surgeon: Daneil Dolin, MD;  [facility]: [facility];  Service: Endoscopy;  Laterality: N/A;  . Colonoscopy N/A 09/20/2013    SLF:A near circumferential fungating mass with friable surfaces was found in the rectum.  ACTIVELY OOZING.  CLOTS SEEN IN LUMEN AND ASPIRATED.  Multiple biopsies were performed using cold forceps.  Thermal therapy(BICAP 7 Fr 25W) was used to ATTEMPT TO control bleeding.    HEMOSTASIS NOT ACHEIVED.  Marland Kitchen Cystoscopy w/ ureteral stent placement Left 09/24/2013    Procedure: CYSTOSCOPY WITH LEFT RETROGRADE PYELOGRAM; LEFT URETERAL STENT PLACEMENT; REMOVAL OF SMALL BLADDER CALCULI;  Surgeon: Marissa Nestle, MD;  Location: [facility];  Service: Urology;  Laterality: Left;  . Flexible sigmoidoscopy N/A 09/25/2013    BTD:[id number] exophytic rectal  mass most likely representing recurrent adenocarcinoma status post biopsy  . Cystoscopy with retrograde pyelogram, ureteroscopy and stent placement Left 05/08/2014    Procedure: CYSTOSCOPY/ EVACUATION OF CLOTS  WITH LEFT RETROGRADE PYELOGRAM,LEFT URETEROSCOPY ;  Surgeon: Malka So, MD;  Location: [facility];  Service: Urology;  Laterality: Left;  . Nephrolithotomy Left 05/13/2014    Procedure: LEFT NEPHROLITHOTOMY PERCUTANEOUS FIRST STAGE;  Surgeon: Malka So, MD;  Location: [facility];  Service: Urology;  Laterality: Left;  . Holmium laser application Left 10/62/6948    Procedure: HOLMIUM LASER APPLICATION;  Surgeon: Malka So, MD;  Location: [facility];  Service: Urology;  Laterality: Left;   Family History  Problem Relation Age of Onset  . Cervical cancer Mother   . Rectal cancer Father 49  . Uterine cancer Sister    History  Substance Use Topics  . Smoking status: Former Smoker -- 2.00 packs/day for 30 years    Types: Cigarettes, Cigars  . Smokeless tobacco: Former Systems developer    Types: Chew    Quit date: 07/17/2005  . Alcohol Use: No     Comment: last drink 08/2013    Review of Systems  All other systems reviewed and are negative.     Allergies  Daypro and Xeloda  Home Medications   Prior to Admission medications   Medication Sig Start Date End Date Taking? Authorizing Provider  ciprofloxacin (CIPRO) 250 MG tablet Take 250 mg by mouth 2 (two) times daily.   Yes Historical Provider, MD  cyanocobalamin (,VITAMIN B-12,) 1000 MCG/ML injection Inject 1,000 mcg into the muscle every 30 (thirty) days.    Yes Historical Provider, MD  folic acid (FOLVITE) 1 MG tablet Take 1 tablet (1 mg total) by mouth daily. 04/28/14  Yes Baird Cancer, PA-C  HYDROcodone-acetaminophen (NORCO) 7.5-325 MG per tablet Take 1 tablet by mouth every 6 (six) hours as needed for moderate pain. 09/15/14  Yes Baird Cancer, PA-C  hydrocortisone cream 1 % Apply 1 application topically 2 (two) times daily. Mix  with urea cream and apply to affected areas. 04/23/14  Yes Baird Cancer, PA-C  lidocaine-prilocaine (EMLA) cream Apply a quarter size amount to port site 1 hour prior to chemo. Do not rub in. Cover with plastic wrap. 03/13/14  Yes Manon Hilding Kefalas, PA-C  magnesium oxide (MAG-OX) 400 (241.3 MG) MG tablet Take 1 tablet (400 mg total) by mouth daily. Patient taking differently: Take 400 mg by mouth daily. Patient takes at [facility] 09/08/14  Yes Patrici Ranks, MD  omeprazole (PRILOSEC) 20 MG capsule Take 1 capsule (20 mg total) by mouth daily. 09/28/13  Yes Rexene Alberts, MD  ondansetron (ZOFRAN) 8 MG tablet Take 1 tablet (8 mg total) by mouth 2 (  two) times daily as needed for nausea or vomiting. 03/13/14  Yes Manon Hilding Kefalas, PA-C  temazepam (RESTORIL) 15 MG capsule Take 1-2 capsules (15-30 mg total) by mouth at bedtime as needed for sleep. 06/09/14  Yes Manon Hilding Kefalas, PA-C  urea (CARMOL) 10 % cream Apply topically as needed. Mix with hydrocortisone cream and apply to affected areas. 04/23/14  Yes Thomas S Kefalas, PA-C   BP 101/51 mmHg  Pulse 74  Temp(Src) 97.5 F (36.4 C) (Oral)  Resp 16  Ht '5\' 6"'$  (1.676 m)  Wt 192 lb (87.091 kg)  BMI 31.00 kg/m2  SpO2 100% Physical Exam  ED Course  Procedures (including critical care time)  DIAGNOSTIC STUDIES: Oxygen Saturation is 100% on room air, normal by my interpretation.    COORDINATION OF CARE: 11:32 AM Discussed treatment plan with pt at bedside and pt agreed to plan.   Labs Review Labs Reviewed  CBC WITH DIFFERENTIAL/PLATELET - Abnormal; Notable for the following:    RBC 2.52 (*)    Hemoglobin 7.8 (*)    HCT 23.2 (*)    Platelets 47 (*)    Neutrophils Relative % 81 (*)    All other components within normal limits  COMPREHENSIVE METABOLIC PANEL - Abnormal; Notable for the following:    Sodium 134 (*)    CO2 17 (*)    Glucose, Bld 169 (*)    BUN 76 (*)    Creatinine, Ser 3.86 (*)    Calcium 7.9 (*)    Albumin 2.3 (*)    AST  49 (*)    Alkaline Phosphatase 179 (*)    Total Bilirubin 1.5 (*)    GFR calc non Af Amer 15 (*)    GFR calc Af Amer 18 (*)    All other components within normal limits  URINALYSIS, ROUTINE W REFLEX MICROSCOPIC - Abnormal; Notable for the following:    Color, Urine BROWN (*)    APPearance TURBID (*)    Glucose, UA 250 (*)    Hgb urine dipstick LARGE (*)    Bilirubin Urine LARGE (*)    Ketones, ur TRACE (*)    Protein, ur >300 (*)    Urobilinogen, UA 4.0 (*)    Nitrite POSITIVE (*)    Leukocytes, UA MODERATE (*)    All other components within normal limits  URINE MICROSCOPIC-ADD ON - Abnormal; Notable for the following:    Bacteria, UA MANY (*)    All other components within normal limits  URINE CULTURE    Imaging Review No results found.   EKG Interpretation None      MDM   Final diagnoses:  Acute renal failure    Patient resents to the ER for evaluation of renal failure. Patient had outpatient labs that showed significant elevation of his BUN and creatinine compared to one week ago. Patient had creatinine of 1.8 1-1/2 weeks ago, today it was 4.25. He was referred to the ER. Patient is currently being treated for urinary tract infection. He has a left ureteral stent and chronic indwelling Foley catheter, both of which managed by Dr. Roni Bread at [facility] urology. He is on Cipro for the urinary tract infection.  Patient not expressing any abdominal pain. He does report that he continues to have blood in his urine and occasionally has clots, but his catheter has been draining appropriately. He has not had any abdominal distention to suggest outlet obstruction. I did discuss the case with Dr. Tresa Moore, on call for urology. He did  recommend performing a noncontrast CT to evaluate the placement of the stent and ensure that there is no significant outlet obstruction or hydronephrosis. CT did confirm significant left-sided hydronephrosis and mild right-sided hydronephrosis. Based on the CT  results, Dr. Tresa Moore feels that the patient will need bilateral stents. Patient will be admitted to hospitalist service. He did not feel that the patient needed any additional antibiotics at this time.    I personally performed the services described in this documentation, which was scribed in my presence. The recorded information has been reviewed and is accurate.     Orpah Greek, MD 09/26/14 1541  Orpah Greek, MD 09/26/14 [place], MD 09/26/14 [phone]

## 2014-09-27 ENCOUNTER — Inpatient Hospital Stay (HOSPITAL_COMMUNITY): Payer: Managed Care, Other (non HMO)

## 2014-09-27 ENCOUNTER — Encounter (HOSPITAL_COMMUNITY): Payer: Self-pay | Admitting: Internal Medicine

## 2014-09-27 DIAGNOSIS — N390 Urinary tract infection, site not specified: Secondary | ICD-10-CM | POA: Diagnosis present

## 2014-09-27 DIAGNOSIS — D62 Acute posthemorrhagic anemia: Secondary | ICD-10-CM

## 2014-09-27 DIAGNOSIS — D638 Anemia in other chronic diseases classified elsewhere: Secondary | ICD-10-CM | POA: Diagnosis present

## 2014-09-27 DIAGNOSIS — N189 Chronic kidney disease, unspecified: Secondary | ICD-10-CM

## 2014-09-27 DIAGNOSIS — D631 Anemia in chronic kidney disease: Secondary | ICD-10-CM

## 2014-09-27 DIAGNOSIS — N1330 Unspecified hydronephrosis: Secondary | ICD-10-CM

## 2014-09-27 DIAGNOSIS — R310 Gross hematuria: Secondary | ICD-10-CM

## 2014-09-27 DIAGNOSIS — N139 Obstructive and reflux uropathy, unspecified: Secondary | ICD-10-CM | POA: Diagnosis present

## 2014-09-27 HISTORY — DX: Unspecified hydronephrosis: N13.30

## 2014-09-27 LAB — GLUCOSE, CAPILLARY
GLUCOSE-CAPILLARY: 127 mg/dL — AB (ref 70–99)
Glucose-Capillary: 96 mg/dL (ref 70–99)
Glucose-Capillary: 81 mg/dL (ref 70–99)
Glucose-Capillary: 100 mg/dL — ABNORMAL HIGH (ref 70–99)
Glucose-Capillary: 107 mg/dL — ABNORMAL HIGH (ref 70–99)

## 2014-09-27 LAB — COMPREHENSIVE METABOLIC PANEL
ALT: 46 U/L (ref 0–53)
AST: 50 U/L — ABNORMAL HIGH (ref 0–37)
Albumin: 2.2 g/dL — ABNORMAL LOW (ref 3.5–5.2)
Alkaline Phosphatase: 172 U/L — ABNORMAL HIGH (ref 39–117)
Anion gap: 6 (ref 5–15)
BUN: 74 mg/dL — ABNORMAL HIGH (ref 6–23)
CO2: 18 mmol/L — ABNORMAL LOW (ref 19–32)
Calcium: 8 mg/dL — ABNORMAL LOW (ref 8.4–10.5)
Chloride: 115 mmol/L — ABNORMAL HIGH (ref 96–112)
Creatinine, Ser: 4.13 mg/dL — ABNORMAL HIGH (ref 0.50–1.35)
GFR calc Af Amer: 17 mL/min — ABNORMAL LOW (ref >90)
GFR calc non Af Amer: 14 mL/min — ABNORMAL LOW (ref >90)
GLUCOSE: 92 mg/dL (ref 70–99)
Potassium: 5.6 mmol/L — ABNORMAL HIGH (ref 3.5–5.1)
SODIUM: 139 mmol/L (ref 135–145)
Total Bilirubin: 1.5 mg/dL — ABNORMAL HIGH (ref 0.3–1.2)
Total Protein: 5.9 g/dL — ABNORMAL LOW (ref 6.0–8.3)

## 2014-09-27 LAB — URINE CULTURE
CULTURE: NO GROWTH
Colony Count: NO GROWTH

## 2014-09-27 LAB — CBC
HCT: 23.2 % — ABNORMAL LOW (ref 39.0–52.0)
Hemoglobin: 7.7 g/dL — ABNORMAL LOW (ref 13.0–17.0)
MCH: 30.9 pg (ref 26.0–34.0)
MCHC: 33.2 g/dL (ref 30.0–36.0)
MCV: 93.2 fL (ref 78.0–100.0)
Platelets: 47 10*3/uL — ABNORMAL LOW (ref 150–400)
RBC: 2.49 MIL/uL — ABNORMAL LOW (ref 4.22–5.81)
RDW: 15.5 % (ref 11.5–15.5)
WBC: 6.4 10*3/uL (ref 4.0–10.5)

## 2014-09-27 LAB — PREPARE RBC (CROSSMATCH)

## 2014-09-27 MED ORDER — SODIUM POLYSTYRENE SULFONATE 15 GM/60ML PO SUSP
30.0000 g | ORAL | Status: AC
  Administered 2014-09-27: 30 g via ORAL
  Filled 2014-09-27: qty 120

## 2014-09-27 MED ORDER — SODIUM CHLORIDE 0.9 % IV BOLUS (SEPSIS)
500.0000 mL | Freq: Once | INTRAVENOUS | Status: AC
  Administered 2014-09-27: 500 mL via INTRAVENOUS

## 2014-09-27 MED ORDER — SODIUM CHLORIDE 0.9 % IV SOLN
INTRAVENOUS | Status: DC
  Administered 2014-09-28 – 2014-09-29 (×3): via INTRAVENOUS
  Administered 2014-09-30: 135 mL/h via INTRAVENOUS
  Administered 2014-09-30 – 2014-10-03 (×7): via INTRAVENOUS

## 2014-09-27 MED ORDER — FUROSEMIDE 10 MG/ML IJ SOLN
100.0000 mg | Freq: Two times a day (BID) | INTRAVENOUS | Status: DC
  Administered 2014-09-27 (×2): 100 mg via INTRAVENOUS
  Filled 2014-09-27 (×5): qty 10

## 2014-09-27 MED ORDER — SODIUM CHLORIDE 0.9 % IV SOLN
Freq: Once | INTRAVENOUS | Status: AC
  Administered 2014-09-27: 14:00:00 via INTRAVENOUS

## 2014-09-27 MED ORDER — CIPROFLOXACIN HCL 250 MG PO TABS
500.0000 mg | ORAL_TABLET | Freq: Every day | ORAL | Status: DC
  Administered 2014-09-27 – 2014-10-03 (×7): 500 mg via ORAL
  Filled 2014-09-27 (×7): qty 2

## 2014-09-27 NOTE — Progress Notes (Signed)
Notified hospitalist of patient's urinary output.  Patient has only had approximately 200cc bloody urine through foley.  Patient is comfortable, has no complaints at this time. Vitals have been stable during the night.  hospitalist ordered 500cc bolus for patient. Also  Called urologist who was on call and received no response at this time. Will  pass on to daytime shift.

## 2014-09-27 NOTE — Consult Note (Signed)
Reason for Consult: Acute kidney injury superimposed on chronic and hyperkalemia Referring Physician: Dr. [name] is an 64 y.o. male.  HPI: He is a patient who has history of colon cancer status post subtotal colostomy /radiation and chemotherapy, history of adeno cancer of the rectum and bilateral hydronephrosis presently sent to the emergency room because of worsening of renal failure. Patient was seen by urology and he had cystoscopy with clot evacuation and left stent removal followed with bilateral stent placement. Presently patient is asymptomatic but remained oliguric and had about 200 mL of urine output since he came to the hospital. Presently his BUN and creatinine is still remain high. Patient however denies any nausea, vomiting. His appetite is good.  Past Medical History  Diagnosis Date  . Pancytopenia, acquired 12/04/2010  . B12 deficiency 12/07/2010  . Kidney stones   . CAD (coronary artery disease)   . Anemia   . History of radiation therapy 06/19/07 -07/27/07    whole pelvis  . Hx of radiation therapy 08/06/12,08/09/12,& 08/13/12    rul 54GY/60f  . Cancer     Colon ca dx 2008 surg/rad/chemo  . Lung cancer   . FH: chemotherapy   . Rectal bleeding 09/20/2013    From recurrent rectal mass/recurrent rectal adenocarcinoma  . Acute blood loss anemia 09/27/2013  . 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  2.7 cm primary, moderately differentiated with 3/8 positive lymph nodes without LVI.  Surgery was on 04/22/2007 and was treated postoperatively with radiation and concomitant capecitabine and then 4 cycles of a planned 6 with oxaliplatin and capecitabine.  His counts would not allow treatment of  . Hydronephrosis of left kidney 09/24/2013    Kidney stones; s/p cystoscopy and stent  . Gross hematuria 09/25/2103  . Lung metastases 07/09/2013  . Secondary malignancy of right lung  07/09/2013  . Insomnia 06/09/2014  . Foley catheter in place 07/02/14  . Broken collarbone feb 2016  . Myocardial infarction     2003   . Heart murmur   . Sleep apnea     hx of cpap no longer wears   . Diabetes mellitus without complication     hx of no longer on meds   . Hypertension     hx of   . Shortness of breath dyspnea     due to radiaiton treatment   . GERD (gastroesophageal reflux disease)   . Bilateral hydronephrosis 09/27/2014    Past Surgical History  Procedure Laterality Date  . Ileostomy  12/07/2010    Procedure: ILEOSTOMY;  Surgeon: BDonato Heinz  Location: [facility];  Service: General;  Laterality: N/A;  Diverting Ileostomy Lysis of adhesions Exploratory Laparotomy  . Lung biopsy  10/27/10    rt lobe- adenocarcinoma  . Cholecystectomy    . Cataract extraction w/ intraocular lens implant  2007    bil  . Lithotripsy  2002  . Coronary angioplasty with stent placement  2003    stenting x 2  . Colon resection  2008    x2  . Esophagogastroduodenoscopy N/A 05/02/2013    Dr. RGala Romney polypoid antral fold, bx= reactive gastropathy  . Givens capsule study N/A 05/02/2013    Procedure: GIVENS CAPSULE STUDY;  Surgeon: RDaneil Dolin MD;  Location: [facility];  Service: Endoscopy;  Laterality: N/A;  . Colonoscopy N/A 09/20/2013    SLF:A near  circumferential fungating mass with friable surfaces was found in the rectum.  ACTIVELY OOZING.  CLOTS SEEN IN LUMEN AND ASPIRATED.  Multiple biopsies were performed using cold forceps.  Thermal therapy(BICAP 7 Fr 25W) was used to ATTEMPT TO control bleeding.    HEMOSTASIS NOT ACHEIVED.  Marland Kitchen Cystoscopy w/ ureteral stent placement Left 09/24/2013    Procedure: CYSTOSCOPY WITH LEFT RETROGRADE PYELOGRAM; LEFT URETERAL STENT PLACEMENT; REMOVAL OF SMALL BLADDER CALCULI;  Surgeon: Marissa Nestle, MD;  Location: [facility];  Service: Urology;  Laterality: Left;  . Flexible sigmoidoscopy N/A 09/25/2013    YWV:[id number] exophytic rectal mass most likely  representing recurrent adenocarcinoma status post biopsy  . Cystoscopy with retrograde pyelogram, ureteroscopy and stent placement Left 05/08/2014    Procedure: CYSTOSCOPY/ EVACUATION OF CLOTS  WITH LEFT RETROGRADE PYELOGRAM,LEFT URETEROSCOPY ;  Surgeon: Malka So, MD;  Location: [facility];  Service: Urology;  Laterality: Left;  . Nephrolithotomy Left 05/13/2014    Procedure: LEFT NEPHROLITHOTOMY PERCUTANEOUS FIRST STAGE;  Surgeon: Malka So, MD;  Location: [facility];  Service: Urology;  Laterality: Left;  . Holmium laser application Left 85/46/2703    Procedure: HOLMIUM LASER APPLICATION;  Surgeon: Malka So, MD;  Location: [facility];  Service: Urology;  Laterality: Left;    Family History  Problem Relation Age of Onset  . Cervical cancer Mother   . Rectal cancer Father 47  . Uterine cancer Sister     Social History:  reports that he has quit smoking. His smoking use included Cigarettes and Cigars. He has a 60 pack-year smoking history. He quit smokeless tobacco use about 9 years ago. His smokeless tobacco use included Chew. He reports that he does not drink alcohol or use illicit drugs.  Allergies:  Allergies  Allergen Reactions  . Daypro [Oxaprozin] Nausea And Vomiting  . Xeloda [Capecitabine] Other (See Comments)    Homocidal and suicidal ideations    Medications: I have reviewed the patient's current medications.  Results for orders placed or performed during the hospital encounter of 09/26/14 (from the past 48 hour(s))  CBC with Differential/Platelet     Status: Abnormal   Collection Time: 09/26/14 12:30 PM  Result Value Ref Range   WBC 5.9 4.0 - 10.5 K/uL   RBC 2.52 (L) 4.22 - 5.81 MIL/uL   Hemoglobin 7.8 (L) 13.0 - 17.0 g/dL   HCT 23.2 (L) 39.0 - 52.0 %   MCV 92.1 78.0 - 100.0 fL   MCH 31.0 26.0 - 34.0 pg   MCHC 33.6 30.0 - 36.0 g/dL   RDW 15.4 11.5 - 15.5 %   Platelets 47 (L) 150 - 400 K/uL    Comment: SPECIMEN CHECKED FOR CLOTS PLATELET COUNT CONFIRMED BY SMEAR     Neutrophils Relative % 81 (H) 43 - 77 %   Neutro Abs 4.8 1.7 - 7.7 K/uL   Lymphocytes Relative 12 12 - 46 %   Lymphs Abs 0.7 0.7 - 4.0 K/uL   Monocytes Relative 6 3 - 12 %   Monocytes Absolute 0.4 0.1 - 1.0 K/uL   Eosinophils Relative 1 0 - 5 %   Eosinophils Absolute 0.1 0.0 - 0.7 K/uL   Basophils Relative 0 0 - 1 %   Basophils Absolute 0.0 0.0 - 0.1 K/uL   Smear Review PLATELETS APPEAR DECREASED   Comprehensive metabolic panel     Status: Abnormal   Collection Time: 09/26/14 12:30 PM  Result Value Ref Range   Sodium 134 (L) 135 - 145 mmol/L  Potassium 4.6 3.5 - 5.1 mmol/L   Chloride 110 96 - 112 mmol/L   CO2 17 (L) 19 - 32 mmol/L   Glucose, Bld 169 (H) 70 - 99 mg/dL   BUN 76 (H) 6 - 23 mg/dL   Creatinine, Ser 3.86 (H) 0.50 - 1.35 mg/dL   Calcium 7.9 (L) 8.4 - 10.5 mg/dL   Total Protein 6.1 6.0 - 8.3 g/dL   Albumin 2.3 (L) 3.5 - 5.2 g/dL   AST 49 (H) 0 - 37 U/L   ALT 45 0 - 53 U/L   Alkaline Phosphatase 179 (H) 39 - 117 U/L   Total Bilirubin 1.5 (H) 0.3 - 1.2 mg/dL   GFR calc non Af Amer 15 (L) >90 mL/min   GFR calc Af Amer 18 (L) >90 mL/min    Comment: (NOTE) The eGFR has been calculated using the CKD EPI equation. This calculation has not been validated in all clinical situations. eGFR's persistently <90 mL/min signify possible Chronic Kidney Disease.    Anion gap 7 5 - 15  Urinalysis, Routine w reflex microscopic     Status: Abnormal   Collection Time: 09/26/14  1:00 PM  Result Value Ref Range   Color, Urine BROWN (A) YELLOW    Comment: BIOCHEMICALS MAY BE AFFECTED BY COLOR   APPearance TURBID (A) CLEAR   Specific Gravity, Urine 1.020 1.005 - 1.030   pH 7.0 5.0 - 8.0   Glucose, UA 250 (A) NEGATIVE mg/dL   Hgb urine dipstick LARGE (A) NEGATIVE   Bilirubin Urine LARGE (A) NEGATIVE   Ketones, ur TRACE (A) NEGATIVE mg/dL   Protein, ur >300 (A) NEGATIVE mg/dL   Urobilinogen, UA 4.0 (H) 0.0 - 1.0 mg/dL   Nitrite POSITIVE (A) NEGATIVE   Leukocytes, UA MODERATE (A)  NEGATIVE  Urine microscopic-add on     Status: Abnormal   Collection Time: 09/26/14  1:00 PM  Result Value Ref Range   WBC, UA 11-20 <3 WBC/hpf   RBC / HPF TOO NUMEROUS TO COUNT <3 RBC/hpf   Bacteria, UA MANY (A) RARE  Type and screen for Red Blood Exchange     Status: None   Collection Time: 09/26/14  5:58 PM  Result Value Ref Range   ABO/RH(D) A POS    Antibody Screen NEG    Sample Expiration 09/29/2014   POC CBG, ED     Status: Abnormal   Collection Time: 09/26/14  5:58 PM  Result Value Ref Range   Glucose-Capillary 105 (H) 70 - 99 mg/dL  Glucose, capillary     Status: None   Collection Time: 09/26/14  7:33 PM  Result Value Ref Range   Glucose-Capillary 95 70 - 99 mg/dL  Glucose, capillary     Status: None   Collection Time: 09/27/14  3:01 AM  Result Value Ref Range   Glucose-Capillary 96 70 - 99 mg/dL   Comment 1 Notify RN   Comprehensive metabolic panel     Status: Abnormal   Collection Time: 09/27/14  6:20 AM  Result Value Ref Range   Sodium 139 135 - 145 mmol/L   Potassium 5.6 (H) 3.5 - 5.1 mmol/L    Comment: DELTA CHECK NOTED   Chloride 115 (H) 96 - 112 mmol/L   CO2 18 (L) 19 - 32 mmol/L   Glucose, Bld 92 70 - 99 mg/dL   BUN 74 (H) 6 - 23 mg/dL   Creatinine, Ser 4.13 (H) 0.50 - 1.35 mg/dL   Calcium 8.0 (L) 8.4 - 10.5  mg/dL   Total Protein 5.9 (L) 6.0 - 8.3 g/dL   Albumin 2.2 (L) 3.5 - 5.2 g/dL   AST 50 (H) 0 - 37 U/L   ALT 46 0 - 53 U/L   Alkaline Phosphatase 172 (H) 39 - 117 U/L   Total Bilirubin 1.5 (H) 0.3 - 1.2 mg/dL   GFR calc non Af Amer 14 (L) >90 mL/min   GFR calc Af Amer 17 (L) >90 mL/min    Comment: (NOTE) The eGFR has been calculated using the CKD EPI equation. This calculation has not been validated in all clinical situations. eGFR's persistently <90 mL/min signify possible Chronic Kidney Disease.    Anion gap 6 5 - 15  CBC     Status: Abnormal   Collection Time: 09/27/14  6:20 AM  Result Value Ref Range   WBC 6.4 4.0 - 10.5 K/uL   RBC 2.49  (L) 4.22 - 5.81 MIL/uL   Hemoglobin 7.7 (L) 13.0 - 17.0 g/dL   HCT 23.2 (L) 39.0 - 52.0 %   MCV 93.2 78.0 - 100.0 fL   MCH 30.9 26.0 - 34.0 pg   MCHC 33.2 30.0 - 36.0 g/dL   RDW 15.5 11.5 - 15.5 %   Platelets 47 (L) 150 - 400 K/uL    Comment: SPECIMEN CHECKED FOR CLOTS CONSISTENT WITH PREVIOUS RESULT   Glucose, capillary     Status: None   Collection Time: 09/27/14  7:24 AM  Result Value Ref Range   Glucose-Capillary 81 70 - 99 mg/dL    Ct Abdomen Pelvis Wo Contrast  09/26/2014   CLINICAL DATA:  Left renal stent placement.  Elevated creatinine.  EXAM: CT ABDOMEN AND PELVIS WITHOUT CONTRAST  TECHNIQUE: Multidetector CT imaging of the abdomen and pelvis was performed following the standard protocol without IV contrast.  COMPARISON:  CT 05/14/2014  FINDINGS: Small nodules peripherally in the right middle lobe are stable since prior study. No pleural effusions. Heart is normal size. Coronary artery calcifications noted.  Prior cholecystectomy. Liver, pancreas, adrenals are unremarkable. Spleen is enlarged with a craniocaudal length of 14.8 cm compared with 12.9 cm previously.  There is severe left hydronephrosis. Proximal end of the left ureteral stent is within the proximal left ureter. The catheter loops in the urinary bladder. There are locules of gas noted dependently and non dependently within the urinary bladder. Foley catheter is in place. There are also locules of gas within the dilated left renal collecting system. Question related to instrumentation/catheterization of the bladder. Cannot exclude pyonephrosis.  There is mild right hydronephrosis, new since prior study. Bilateral low-density areas within the kidneys likely represent cysts. Punctate nonobstructing right renal stones noted. No ureteral stones.  Postoperative changes from prior subtotal colectomy. Right lower quadrant ostomy noted. There are herniated small bowel loops through the stomal defect into the anterior abdominal wall,  similar to prior study. No evidence of bowel obstruction.  Aorta is normal caliber.  No free fluid, free air or adenopathy.  No acute bony abnormality or focal bone lesion.  IMPRESSION: Severe left hydronephrosis. The left ureteral stent loops in the bladder distally with the proximal end of the stent in the proximal left ureter.  Gas within the urinary bladder and left renal collecting system. This may be related to bladder instrumentation/ catheterization, but cannot exclude pyonephrosis.  New mild right hydronephrosis.  Right nephrolithiasis.  Right lower quadrant ostomy again noted with parastomal herniation of small bowel loops. No obstruction.  Increasing splenomegaly.   Electronically Signed  By: Rolm Baptise M.D.   On: 09/26/2014 16:09   Dg Retrograde Pyelogram  09/26/2014   CLINICAL DATA:  Renal failure, left-greater-than-right hydronephrosis  EXAM: RETROGRADE PYELOGRAM  COMPARISON:  None.  FINDINGS: Retrograde opacification on the left shows severe hydronephrosis. A wire is passed in a retrograde fashion into the mid pole calyx. Moderate to severe right hydronephrosis also identified.  IMPRESSION: Bilateral hydronephrosis   Electronically Signed   By: Skipper Cliche M.D.   On: 09/26/2014 20:45    Review of Systems  Constitutional: Negative for fever and chills.  Respiratory: Negative for cough.   Cardiovascular: Negative for chest pain and orthopnea.  Gastrointestinal: Positive for diarrhea. Negative for nausea, vomiting and abdominal pain.  Genitourinary: Positive for hematuria and flank pain.   Blood pressure 105/47, pulse 65, temperature 97.7 F (36.5 C), temperature source Oral, resp. rate 18, height 5' 9"  (1.753 m), weight 85.911 kg (189 lb 6.4 oz), SpO2 100 %. Physical Exam  Constitutional: He is oriented to person, place, and time. No distress.  Eyes: No scleral icterus.  Neck: No JVD present.  Cardiovascular: Normal rate and regular rhythm.   No murmur heard. Respiratory: No  respiratory distress. He has no wheezes.  GI: He exhibits no distension. There is no rebound.  Patient with colostomy bag and watery diarrhea  Musculoskeletal: He exhibits no edema.  Neurological: He is alert and oriented to person, place, and time.    Assessment/Plan: Problem #1 acute kidney injury: Most likely obstructive uropathy. Presently patient is oliguric. Patient had bilateral stent placement. At this moment for sure whether his hydronephrosis has resolved. Since patient also has continuous diarrhea possibly dehydration may play some role. Problem #2 hyperkalemia: His potassium at this moment seems to be worsening. Problem #3 history of chronic renal failure: His creatinine was 1.26 on 08/2011, increased to 1.5-19/2014. About 3 weeks ago his creatinine was 1.53 stage III.  Problem #4 anemia: His hemoglobin and hematocrit is declining Problem #5 history of colon cancer: Status post surgery/radiation/chemotherapy Problem #6 adeno C of the rectum Problem #7 history of coronary artery disease Plan: Agree with hydration We'll start patient on Lasix 100 mg IV twice a day We'll do ultrasound of the kidneys to see whether bilateral Hydro has improved We'll check his basic metabolic panel in the morning We'll give him Kayexalate  30 g by mouth 1 dose.   [name] S 09/27/2014, 8:22 AM

## 2014-09-27 NOTE — Progress Notes (Signed)
Utilization review Completed Abeera Flannery RN BSN   

## 2014-09-27 NOTE — Progress Notes (Signed)
[facility] PROGRESS NOTE  [name] XHB:[id number] DOB: [birth year] DOA: 09/26/2014 PCP: [name], MD    Code Status: Full code Family Communication: Family not available; discussed with patient Disposition Plan: Discharge when clinically appropriate.   Consultants:  Urology  Nephrology  Procedures:  Cystoscopy with clot evacuation; left ureteral stent removal; bilateral retrograde pyelograms; bilateral ureteral stent placement, Dr. Collene Mares, 09/26/14.  Antibiotics:  Cipro 4/30>>  HPI/Subjective: The patient is sitting up in bed. He has no complaints of abdominal pain, nausea, or vomiting.  Objective: Filed Vitals:   09/27/14 0557  BP: 105/47  Pulse: 65  Temp: 97.7 F (36.5 C)  Resp: 18   oxygen saturation 100%.  Intake/Output Summary (Last 24 hours) at 09/27/14 1007 Last data filed at 09/27/14 0400  Gross per 24 hour  Intake    225 ml  Output    280 ml  Net    -55 ml   Filed Weights   09/26/14 1116 09/26/14 2017  Weight: 87.091 kg (192 lb) 85.911 kg (189 lb 6.4 oz)    Exam:   General:  Alert 64 year old Caucasian man in no acute distress.  Cardiovascular: S1, S2, no murmurs rubs or gallops.  Respiratory: Clear to auscultation bilaterally.  Abdomen: Right-sided ileostomy with bag holding greenish bilious fluid; no solid consistency; positive bowel sounds, nontender, nondistended.  GU indwelling Foley catheter in place; dried blood around the meatus; gross bloody urine in the Foley bag.  Musculoskeletal/extremities: No acute hot red joints. No pedal edema.   Neurologic: The patient is alert and oriented 3.  Data Reviewed: Basic Metabolic Panel:  Recent Labs Lab 09/25/14 0840 09/26/14 1230 09/27/14 0620  NA 135 134* 139  K 5.6* 4.6 5.6*  CL 108 110 115*  CO2 15* 17* 18*  GLUCOSE 101* 169* 92  BUN 78* 76* 74*  CREATININE 4.25* 3.86* 4.13*  CALCIUM 8.6 7.9* 8.0*   Liver Function Tests:  Recent Labs Lab  09/25/14 0840 09/26/14 1230 09/27/14 0620  AST 53* 49* 50*  ALT 47 45 46  ALKPHOS 210* 179* 172*  BILITOT 2.3* 1.5* 1.5*  PROT 7.0 6.1 5.9*  ALBUMIN 2.8* 2.3* 2.2*   No results for input(s): LIPASE, AMYLASE in the last 168 hours. No results for input(s): AMMONIA in the last 168 hours. CBC:  Recent Labs Lab 09/25/14 0840 09/26/14 1230 09/27/14 0620  WBC 9.7 5.9 6.4  NEUTROABS  --  4.8  --   HGB 9.4* 7.8* 7.7*  HCT 27.7* 23.2* 23.2*  MCV 93.3 92.1 93.2  PLT 78* 47* 47*   Cardiac Enzymes: No results for input(s): CKTOTAL, CKMB, CKMBINDEX, TROPONINI in the last 168 hours. BNP (last 3 results) No results for input(s): BNP in the last 8760 hours.  ProBNP (last 3 results) No results for input(s): PROBNP in the last 8760 hours.  CBG:  Recent Labs Lab 09/26/14 1758 09/26/14 1933 09/27/14 0301 09/27/14 0724  GLUCAP 105* 95 96 81    No results found for this or any previous visit (from the past 240 hour(s)).   Studies: Ct Abdomen Pelvis Wo Contrast  09/26/2014   CLINICAL DATA:  Left renal stent placement.  Elevated creatinine.  EXAM: CT ABDOMEN AND PELVIS WITHOUT CONTRAST  TECHNIQUE: Multidetector CT imaging of the abdomen and pelvis was performed following the standard protocol without IV contrast.  COMPARISON:  CT 05/14/2014  FINDINGS: Small nodules peripherally in the right middle lobe are stable since prior study. No pleural effusions. Heart is normal size. Coronary  artery calcifications noted.  Prior cholecystectomy. Liver, pancreas, adrenals are unremarkable. Spleen is enlarged with a craniocaudal length of 14.8 cm compared with 12.9 cm previously.  There is severe left hydronephrosis. Proximal end of the left ureteral stent is within the proximal left ureter. The catheter loops in the urinary bladder. There are locules of gas noted dependently and non dependently within the urinary bladder. Foley catheter is in place. There are also locules of gas within the dilated left  renal collecting system. Question related to instrumentation/catheterization of the bladder. Cannot exclude pyonephrosis.  There is mild right hydronephrosis, new since prior study. Bilateral low-density areas within the kidneys likely represent cysts. Punctate nonobstructing right renal stones noted. No ureteral stones.  Postoperative changes from prior subtotal colectomy. Right lower quadrant ostomy noted. There are herniated small bowel loops through the stomal defect into the anterior abdominal wall, similar to prior study. No evidence of bowel obstruction.  Aorta is normal caliber.  No free fluid, free air or adenopathy.  No acute bony abnormality or focal bone lesion.  IMPRESSION: Severe left hydronephrosis. The left ureteral stent loops in the bladder distally with the proximal end of the stent in the proximal left ureter.  Gas within the urinary bladder and left renal collecting system. This may be related to bladder instrumentation/ catheterization, but cannot exclude pyonephrosis.  New mild right hydronephrosis.  Right nephrolithiasis.  Right lower quadrant ostomy again noted with parastomal herniation of small bowel loops. No obstruction.  Increasing splenomegaly.   Electronically Signed   By: Rolm Baptise M.D.   On: 09/26/2014 16:09   Dg Retrograde Pyelogram  09/26/2014   CLINICAL DATA:  Renal failure, left-greater-than-right hydronephrosis  EXAM: RETROGRADE PYELOGRAM  COMPARISON:  None.  FINDINGS: Retrograde opacification on the left shows severe hydronephrosis. A wire is passed in a retrograde fashion into the mid pole calyx. Moderate to severe right hydronephrosis also identified.  IMPRESSION: Bilateral hydronephrosis   Electronically Signed   By: Skipper Cliche M.D.   On: 09/26/2014 20:45    Scheduled Meds: . folic acid  1 mg Oral Daily  . furosemide  100 mg Intravenous BID  . hydrocortisone cream  1 application Topical BID  . insulin aspart  0-9 Units Subcutaneous 6 times per day  .  magnesium oxide  400 mg Oral Daily  . pantoprazole  40 mg Oral Daily  . sodium chloride  500 mL Intravenous Once  . sodium polystyrene  30 g Oral Q4H   Continuous Infusions: . sodium chloride     Assessment and plan:  Principal Problem:   Bilateral hydronephrosis Active Problems:   Gross hematuria   Acute kidney injury   Pancytopenia, acquired   Thrombocytopenia   Acute blood loss anemia   Anemia of chronic disease   Obstructive uropathy   Recurrent UTI   DM type 2 (diabetes mellitus, type 2)   Adenocarcinoma of rectum   Hyperkalemia   1. Acute kidney injury, likely from obstructive uropathy. The patient's baseline creatinine from early March 2016 was within normal limits. Over the last couple weeks, it has progressively increased. On admission, his creatinine was 3.86. He was started on vigorous IV fluids and urologist, Dr. Collene Mares performed of bilateral ureteral stent placement. Nephrology was consulted. Dr. Lowanda Foster agreed with IV fluid hydration and started the patient on Lasix 100 mg IV twice daily. Renal ultrasound ordered.  Hyperkalemia. The patient's serum potassium was 5.6 on admission. It improved with IV fluid hydration, but increased again. Kayexalate was  ordered by nephrology. We'll continue to monitor.  Obstructive uropathy. The patient has a history of left hydronephrosis and had a stent placement by urology in the past. On admission, CT of his abdomen and pelvis revealed severe left hydronephrosis and new mild right hydronephrosis. Following admission, Dr. Collene Mares performed cystoscopy, removal of the previous left stent and placement of bilateral ureteral stents. Follow-up renal ultrasound pending.  Gross hematuria/pyuria/bacteriuria. Urine culture ordered. Will treat as a urinary tract infection with Cipro.  Acute and chronic blood loss anemia, superimposed on anemia of chronic disease. 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 His hemoglobin was 9.4 on 4/28 and 7.8 at the time of admission. We'll transfuse him 2 units of packed red blood cells.  Acquired pancytopenia/thrombocytopenia. In part, from chemotherapy and from bleeding. His platelet count was 78 on admission and has drifted down to 47 in the setting of gross GU bleeding. He will be transfused 1 unit of platelets.  Type 2 diabetes mellitus. Apparently, he is treated with diet alone now. We'll continue sliding scale NovoLog, sensitive scale.  Metastatic, stage IV adenocarcinoma of the rectum. The patient is followed by oncology and radiation oncology. He is being treated with XRT and single agent chemotherapy per review of the notes and per patient history. Apparently, bone marrow biopsy was recommended but the patient has refused. Will let oncology know of his hospitalization on Monday.   Time spent: 40 minutes    [facility] Pager [phone]. If 7PM-7AM, please contact night-coverage at [email or web address], password [facility] 09/27/2014, 10:07 AM  LOS: 1 day

## 2014-09-27 NOTE — Progress Notes (Signed)
ANTIBIOTIC CONSULT NOTE  Pharmacy Consult for Cipro Indication: Urinary Tract Infection   Allergies  Allergen Reactions  . Daypro [Oxaprozin] Nausea And Vomiting  . Xeloda [Capecitabine] Other (See Comments)    Homocidal and suicidal ideations    Patient Measurements: Height: '5\' 9"'$  (175.3 cm) Weight: 189 lb 6.4 oz (85.911 kg) IBW/kg (Calculated) : 70.7  Vital Signs: Temp: 97.7 F (36.5 C) (04/30 0557) Temp Source: Oral (04/30 0557) BP: 105/47 mmHg (04/30 0557) Pulse Rate: 65 (04/30 0557) Intake/Output from previous day: 04/29 0701 - 04/30 0700 In: 225 [P.O.:150; I.V.:75] Out: 280 [Urine:280] Intake/Output from this shift: Total I/O In: 600 [P.O.:600] Out: -   Labs:  Recent Labs  09/25/14 0840 09/26/14 1230 09/27/14 0620  WBC 9.7 5.9 6.4  HGB 9.4* 7.8* 7.7*  PLT 78* 47* 47*  CREATININE 4.25* 3.86* 4.13*   Estimated Creatinine Clearance: 20.4 mL/min (by C-G formula based on Cr of 4.13). No results for input(s): VANCOTROUGH, VANCOPEAK, VANCORANDOM, GENTTROUGH, GENTPEAK, GENTRANDOM, TOBRATROUGH, TOBRAPEAK, TOBRARND, AMIKACINPEAK, AMIKACINTROU, AMIKACIN in the last 72 hours.   Microbiology: No results found for this or any previous visit (from the past 720 hour(s)).  Anti-infectives    None     Assessment: Okay for Protocol, s/p Cystoscopy with clot evacuation; left ureteral stent removal; bilateral retrograde pyelograms; bilateral ureteral stent placement, Dr. Collene Mares, 09/26/14.  Acute kidney injury, likely from obstructive uropathy, Gross hematuria/pyuria/bacteriuria. Tolerating PO meds/diet.  Goal of Therapy:  Eradicate infection.   Plan:  Cipro '500mg'$  PO daily. Follow up culture results  Pricilla Larsson 09/27/2014,10:44 AM

## 2014-09-28 ENCOUNTER — Inpatient Hospital Stay (HOSPITAL_COMMUNITY): Payer: Managed Care, Other (non HMO)

## 2014-09-28 DIAGNOSIS — N319 Neuromuscular dysfunction of bladder, unspecified: Secondary | ICD-10-CM

## 2014-09-28 DIAGNOSIS — N1330 Unspecified hydronephrosis: Secondary | ICD-10-CM

## 2014-09-28 DIAGNOSIS — N179 Acute kidney failure, unspecified: Secondary | ICD-10-CM | POA: Diagnosis not present

## 2014-09-28 DIAGNOSIS — N390 Urinary tract infection, site not specified: Secondary | ICD-10-CM

## 2014-09-28 LAB — GLUCOSE, CAPILLARY
GLUCOSE-CAPILLARY: 105 mg/dL — AB (ref 70–99)
GLUCOSE-CAPILLARY: 122 mg/dL — AB (ref 70–99)
Glucose-Capillary: 120 mg/dL — ABNORMAL HIGH (ref 70–99)
Glucose-Capillary: 126 mg/dL — ABNORMAL HIGH (ref 70–99)
Glucose-Capillary: 92 mg/dL (ref 70–99)
Glucose-Capillary: 137 mg/dL — ABNORMAL HIGH (ref 70–99)

## 2014-09-28 LAB — BASIC METABOLIC PANEL
Anion gap: 7 (ref 5–15)
BUN: 65 mg/dL — ABNORMAL HIGH (ref 6–20)
CALCIUM: 7.6 mg/dL — AB (ref 8.9–10.3)
CO2: 20 mmol/L — ABNORMAL LOW (ref 22–32)
CREATININE: 4.28 mg/dL — AB (ref 0.61–1.24)
Chloride: 112 mmol/L — ABNORMAL HIGH (ref 101–111)
GFR calc Af Amer: 16 mL/min — ABNORMAL LOW (ref >60)
GFR calc non Af Amer: 14 mL/min — ABNORMAL LOW (ref >60)
Glucose, Bld: 105 mg/dL — ABNORMAL HIGH (ref 70–99)
Potassium: 3.4 mmol/L — ABNORMAL LOW (ref 3.5–5.1)
Sodium: 139 mmol/L (ref 135–145)

## 2014-09-28 LAB — PREPARE PLATELET PHERESIS: Unit division: 0

## 2014-09-28 LAB — CBC
HCT: 25.2 % — ABNORMAL LOW (ref 39.0–52.0)
HEMOGLOBIN: 8.7 g/dL — AB (ref 13.0–17.0)
MCH: 31.5 pg (ref 26.0–34.0)
MCHC: 34.5 g/dL (ref 30.0–36.0)
MCV: 91.3 fL (ref 78.0–100.0)
Platelets: 53 10*3/uL — ABNORMAL LOW (ref 150–400)
RBC: 2.76 MIL/uL — ABNORMAL LOW (ref 4.22–5.81)
RDW: 15.2 % (ref 11.5–15.5)
WBC: 5.7 10*3/uL (ref 4.0–10.5)

## 2014-09-28 LAB — PREPARE RBC (CROSSMATCH)

## 2014-09-28 MED ORDER — SODIUM CHLORIDE 0.9 % IV SOLN
Freq: Once | INTRAVENOUS | Status: AC
  Administered 2014-09-28: 17:00:00 via INTRAVENOUS

## 2014-09-28 MED ORDER — POTASSIUM CHLORIDE CRYS ER 20 MEQ PO TBCR
40.0000 meq | EXTENDED_RELEASE_TABLET | Freq: Once | ORAL | Status: AC
  Administered 2014-09-28: 40 meq via ORAL
  Filled 2014-09-28: qty 2

## 2014-09-28 NOTE — Progress Notes (Signed)
2 Days Post-Op  Subjective:  1 - Acute Renal Failure - Cr 4 on ER labs 4/29 on eval malaise up from baseline of about 1.5.  2 - Lt > Rt Hydronephrosis - chronic hydro on left likelyk 2/2 combinatino priro stones and malignant obstruction / post radiation srictrue. Acutely worse by ER CT with malposition of left ureteral stent not in renal pelvis and new right hydro --> cysto / bilateral stent placement 4/29. RUS 4/30 with improved hydro and decompressed bladder. KUB 5/1 verifies stents in good position.   3 - Bacteruria - pt with chronic foley at baseline. Klebsiella bacteruria by most recent CX last week and has been on CX-specific Cipro.   4- Neurogenic Bladder - manages with chronic foley changed Q monthly.   5 - Gross Hematuria - new gross hematuria this admission. Operative cysto 4/28 with some clot in bladder (irrigated). He does have cystitis and very low platelets.   Today Nakul is seen in f/u above. He remains with some gross hematuria that is actually improved some. Unfortunately GFR remains poor despite bilateral stents / foley to optimize drainage.   Objective: Vital signs in last 24 hours: Temp:  [97.7 F (36.5 C)-98.6 F (37 C)] 98.6 F (37 C) (05/01 0401) Pulse Rate:  [62-70] 66 (05/01 0401) Resp:  [16-18] 16 (05/01 0401) BP: (97-117)/(48-63) 117/63 mmHg (05/01 0401) SpO2:  [100 %] 100 % (05/01 0401) Last BM Date: 09/27/14  Intake/Output from previous day: 04/30 0701 - 05/01 0700 In: 4579 [P.O.:1320; I.V.:2997.5; Blood:261.5] Out: 3480 [Urine:3100; Stool:380] Intake/Output this shift: Total I/O In: 480 [P.O.:480] Out: 200 [Stool:200]  General appearance: alert, cooperative, appears stated age and appears more vigorous today Eyes: negative Nose: Nares normal. Septum midline. Mucosa normal. No drainage or sinus tenderness. Throat: lips, mucosa, and tongue normal; teeth and gums normal Neck: supple, symmetrical, trachea midline Back: symmetric, no curvature. ROM  normal. No CVA tenderness. Resp: non-labored on room air Cardio: Nl rate GI: soft, non-tender; bowel sounds normal; no masses,  no organomegaly and colsotomy patent Male genitalia: normal, foley c/d/i with some dark blood urine that is thin. NO frank clots. Extremities: extremities normal, atraumatic, no cyanosis or edema Neurologic: Grossly normal  Lab Results:   Recent Labs  09/27/14 0620 09/28/14 0626  WBC 6.4 5.7  HGB 7.7* 8.7*  HCT 23.2* 25.2*  PLT 47* 53*   BMET  Recent Labs  09/27/14 0620 09/28/14 0626  NA 139 139  K 5.6* 3.4*  CL 115* 112*  CO2 18* 20*  GLUCOSE 92 105*  BUN 74* 65*  CREATININE 4.13* 4.28*  CALCIUM 8.0* 7.6*   PT/INR No results for input(s): LABPROT, INR in the last 72 hours. ABG No results for input(s): PHART, HCO3 in the last 72 hours.  Invalid input(s): PCO2, PO2  Studies/Results: Ct Abdomen Pelvis Wo Contrast  09/26/2014   CLINICAL DATA:  Left renal stent placement.  Elevated creatinine.  EXAM: CT ABDOMEN AND PELVIS WITHOUT CONTRAST  TECHNIQUE: Multidetector CT imaging of the abdomen and pelvis was performed following the standard protocol without IV contrast.  COMPARISON:  CT 05/14/2014  FINDINGS: Small nodules peripherally in the right middle lobe are stable since prior study. No pleural effusions. Heart is normal size. Coronary artery calcifications noted.  Prior cholecystectomy. Liver, pancreas, adrenals are unremarkable. Spleen is enlarged with a craniocaudal length of 14.8 cm compared with 12.9 cm previously.  There is severe left hydronephrosis. Proximal end of the left ureteral stent is within the proximal left  ureter. The catheter loops in the urinary bladder. There are locules of gas noted dependently and non dependently within the urinary bladder. Foley catheter is in place. There are also locules of gas within the dilated left renal collecting system. Question related to instrumentation/catheterization of the bladder. Cannot exclude  pyonephrosis.  There is mild right hydronephrosis, new since prior study. Bilateral low-density areas within the kidneys likely represent cysts. Punctate nonobstructing right renal stones noted. No ureteral stones.  Postoperative changes from prior subtotal colectomy. Right lower quadrant ostomy noted. There are herniated small bowel loops through the stomal defect into the anterior abdominal wall, similar to prior study. No evidence of bowel obstruction.  Aorta is normal caliber.  No free fluid, free air or adenopathy.  No acute bony abnormality or focal bone lesion.  IMPRESSION: Severe left hydronephrosis. The left ureteral stent loops in the bladder distally with the proximal end of the stent in the proximal left ureter.  Gas within the urinary bladder and left renal collecting system. This may be related to bladder instrumentation/ catheterization, but cannot exclude pyonephrosis.  New mild right hydronephrosis.  Right nephrolithiasis.  Right lower quadrant ostomy again noted with parastomal herniation of small bowel loops. No obstruction.  Increasing splenomegaly.   Electronically Signed   By: Rolm Baptise M.D.   On: 09/26/2014 16:09   Dg Retrograde Pyelogram  09/26/2014   CLINICAL DATA:  Renal failure, left-greater-than-right hydronephrosis  EXAM: RETROGRADE PYELOGRAM  COMPARISON:  None.  FINDINGS: Retrograde opacification on the left shows severe hydronephrosis. A wire is passed in a retrograde fashion into the mid pole calyx. Moderate to severe right hydronephrosis also identified.  IMPRESSION: Bilateral hydronephrosis   Electronically Signed   By: Skipper Cliche M.D.   On: 09/26/2014 20:45   US Renal  09/27/2014   CLINICAL DATA:  Acute renal failure. History renal stones and bilateral ureteral stents.  EXAM: RENAL / URINARY TRACT ULTRASOUND COMPLETE  COMPARISON:  Fluoroscopic guided bilateral retrograde ureteral stent placement - 09/26/2014; CT abdomen pelvis- 09/26/2014  FINDINGS: Right Kidney:   Normal cortical thickness, echogenicity and size, measuring 12.0 cm in length. Note is made of an approximately 2.2 x 2.4 x 2.1 cm anechoic cyst within the superior pole the right kidney which is noted to contain a thin internal echogenic septation (Bosniak 2). No echogenic renal stones. The superior aspect of the recently placed double-J ureteral stent appears appropriately positioned within the right renal pelvis though there is persistent mild right-sided pelvicaliectasis though this appears minimally improved since CT scan performed 09/26/2014.  Left Kidney:  Normal cortical thickness, echogenicity and size, measuring 13.1 cm in length. Note is made of an approximately 2.4 x 2.4 x 2.4 cm anechoic cyst within the inferior pole the left kidney. No echogenic renal stones. There is mild to moderate residual left-sided pelvicaliectasis, improved since CT scan performed 09/26/2014. The superior aspect of the left-sided double-J ureteral stent is not depicted on the present examination.  Bladder:  Appears normal for degree of bladder distention.  IMPRESSION: 1. Post right-sided double-J ureteral stent placement with minimal improvement in persistent mild right-sided pelvicaliectasis. 2. Mild to moderate residual left-sided pelvicaliectasis, improved since abdominal CT performed 09/26/2014. The superior aspect of the left-sided double-J ureteral stent is not depicted on this examination though as ureteral stent can be poorly visualized with ultrasound further evaluation could be performed with abdominal radiograph as clinically indicated. 3. Bilateral renal cysts as above.   Electronically Signed   By: Eldridge Abrahams.D.  On: 09/27/2014 10:37   Dg Abd Portable 1v  09/28/2014   CLINICAL DATA:  Bilateral double-J stent placement for hydronephrosis  EXAM: PORTABLE ABDOMEN - 1 VIEW  COMPARISON:  September 26, 2014 CT abdomen and pelvis and retrograde pyelogram study  FINDINGS: The left double-J stent loops proximally, likely  in a rather markedly dilated collecting system based on recent studies. The tip of the left double-J stent is in the region of the bladder. The right double-J stent has its proximal aspect at L1-2 and distal aspect in the region of the bladder. There is a generalized paucity of bowel gas. No free air seen.  IMPRESSION: Double-J stent positions as described. The proximal right stent is in the normal flank position. The left stent proximally appears to reside in a dilated renal pelvis in a kidney this somewhat inferiorly positioned. Distally, both stents are felt to be within the urinary bladder.  There is a paucity of bowel gas pattern a paucity of gas may be seen normally but also may be seen with enteritis or ileus. Bowel obstruction not felt to be likely.   Electronically Signed   By: Lowella Grip III M.D.   On: 09/28/2014 11:07    Anti-infectives: Anti-infectives    Start     Dose/Rate Route Frequency Ordered Stop   09/27/14 1100  ciprofloxacin (CIPRO) tablet 500 mg     500 mg Oral Daily with breakfast 09/27/14 1047        Assessment/Plan:   1 - Acute Renal Failure - likley combinatino of some obstructive and medical renal components. Obstructive parameters now optimized. Fortunately volume status and K remain acceptable.   2 - Lt > Rt Hydronephrosis - improved by f/u US after stent replacement this admission.   3 - Bacteruria - agree with contiued cystitis treatment, considering renal function  4- Neurogenic Bladder - up to date on catehter change (changed 4/29 this admission)  5 - Gross Hematuria - likely from cystitis in setting of low platelets and recent instrumnetation. Discussed with primary team and agree with platelet transfusion.Marland Kitchen  [facility], Zadkiel Dragan 09/28/2014

## 2014-09-28 NOTE — Anesthesia Postprocedure Evaluation (Signed)
  Anesthesia Post-op Note  Patient: [name]  Procedure(s) Performed: Procedure(s): CYSTOSCOPY WITH CLOT EVACUATION; LEFT URETERAL STENT REMOVAL; BILATERAL RETROGRADE PYELOGRAM; BILATERAL URETERAL STENT PLACEMENT (Bilateral)  Patient Location: Room 312  Anesthesia Type:General  Level of Consciousness: awake, alert , oriented and patient cooperative  Airway and Oxygen Therapy: Patient Spontanous Breathing  Post-op Pain: mild  Post-op Assessment: Post-op Vital signs reviewed, Patient's Cardiovascular Status Stable, Respiratory Function Stable, Patent Airway, No signs of Nausea or vomiting and Pain level controlled  Post-op Vital Signs: Reviewed and stable  Last Vitals:  Filed Vitals:   09/28/14 0401  BP: 117/63  Pulse: 66  Temp: 37 C  Resp: 16    Complications: No apparent anesthesia complications

## 2014-09-28 NOTE — Progress Notes (Addendum)
Subjective: Interval History: has no complaint of nausea or vomiting. Patient denies any difficulty in breathing.  Objective: Vital signs in last 24 hours: Temp:  [97.7 F (36.5 C)-98.6 F (37 C)] 98.6 F (37 C) (05/01 0401) Pulse Rate:  [62-70] 66 (05/01 0401) Resp:  [16-18] 16 (05/01 0401) BP: (97-117)/(48-63) 117/63 mmHg (05/01 0401) SpO2:  [100 %] 100 % (05/01 0401) Weight change:   Intake/Output from previous day: 04/30 0701 - 05/01 0700 In: 4579 [P.O.:1320; I.V.:2997.5; Blood:261.5] Out: 3480 [Urine:3100; Stool:380] Intake/Output this shift:    General appearance: alert, cooperative and no distress Resp: clear to auscultation bilaterally Cardio: regular rate and rhythm, S1, S2 normal, no murmur, click, rub or gallop GI: soft, non-tender; bowel sounds normal; no masses,  no organomegaly and Patient with colostomy bag Extremities: edema Not present  Lab Results:  Recent Labs  09/27/14 0620 09/28/14 0626  WBC 6.4 5.7  HGB 7.7* 8.7*  HCT 23.2* 25.2*  PLT 47* 53*   BMET:  Recent Labs  09/27/14 0620 09/28/14 0626  NA 139 139  K 5.6* 3.4*  CL 115* 112*  CO2 18* 20*  GLUCOSE 92 105*  BUN 74* 65*  CREATININE 4.13* 4.28*  CALCIUM 8.0* 7.6*   No results for input(s): PTH in the last 72 hours. Iron Studies: No results for input(s): IRON, TIBC, TRANSFERRIN, FERRITIN in the last 72 hours.  Studies/Results: Ct Abdomen Pelvis Wo Contrast  09/26/2014   CLINICAL DATA:  Left renal stent placement.  Elevated creatinine.  EXAM: CT ABDOMEN AND PELVIS WITHOUT CONTRAST  TECHNIQUE: Multidetector CT imaging of the abdomen and pelvis was performed following the standard protocol without IV contrast.  COMPARISON:  CT 05/14/2014  FINDINGS: Small nodules peripherally in the right middle lobe are stable since prior study. No pleural effusions. Heart is normal size. Coronary artery calcifications noted.  Prior cholecystectomy. Liver, pancreas, adrenals are unremarkable. Spleen is  enlarged with a craniocaudal length of 14.8 cm compared with 12.9 cm previously.  There is severe left hydronephrosis. Proximal end of the left ureteral stent is within the proximal left ureter. The catheter loops in the urinary bladder. There are locules of gas noted dependently and non dependently within the urinary bladder. Foley catheter is in place. There are also locules of gas within the dilated left renal collecting system. Question related to instrumentation/catheterization of the bladder. Cannot exclude pyonephrosis.  There is mild right hydronephrosis, new since prior study. Bilateral low-density areas within the kidneys likely represent cysts. Punctate nonobstructing right renal stones noted. No ureteral stones.  Postoperative changes from prior subtotal colectomy. Right lower quadrant ostomy noted. There are herniated small bowel loops through the stomal defect into the anterior abdominal wall, similar to prior study. No evidence of bowel obstruction.  Aorta is normal caliber.  No free fluid, free air or adenopathy.  No acute bony abnormality or focal bone lesion.  IMPRESSION: Severe left hydronephrosis. The left ureteral stent loops in the bladder distally with the proximal end of the stent in the proximal left ureter.  Gas within the urinary bladder and left renal collecting system. This may be related to bladder instrumentation/ catheterization, but cannot exclude pyonephrosis.  New mild right hydronephrosis.  Right nephrolithiasis.  Right lower quadrant ostomy again noted with parastomal herniation of small bowel loops. No obstruction.  Increasing splenomegaly.   Electronically Signed   By: Rolm Baptise M.D.   On: 09/26/2014 16:09   Dg Retrograde Pyelogram  09/26/2014   CLINICAL DATA:  Renal failure,  left-greater-than-right hydronephrosis  EXAM: RETROGRADE PYELOGRAM  COMPARISON:  None.  FINDINGS: Retrograde opacification on the left shows severe hydronephrosis. A wire is passed in a retrograde  fashion into the mid pole calyx. Moderate to severe right hydronephrosis also identified.  IMPRESSION: Bilateral hydronephrosis   Electronically Signed   By: Skipper Cliche M.D.   On: 09/26/2014 20:45   US Renal  09/27/2014   CLINICAL DATA:  Acute renal failure. History renal stones and bilateral ureteral stents.  EXAM: RENAL / URINARY TRACT ULTRASOUND COMPLETE  COMPARISON:  Fluoroscopic guided bilateral retrograde ureteral stent placement - 09/26/2014; CT abdomen pelvis- 09/26/2014  FINDINGS: Right Kidney:  Normal cortical thickness, echogenicity and size, measuring 12.0 cm in length. Note is made of an approximately 2.2 x 2.4 x 2.1 cm anechoic cyst within the superior pole the right kidney which is noted to contain a thin internal echogenic septation (Bosniak 2). No echogenic renal stones. The superior aspect of the recently placed double-J ureteral stent appears appropriately positioned within the right renal pelvis though there is persistent mild right-sided pelvicaliectasis though this appears minimally improved since CT scan performed 09/26/2014.  Left Kidney:  Normal cortical thickness, echogenicity and size, measuring 13.1 cm in length. Note is made of an approximately 2.4 x 2.4 x 2.4 cm anechoic cyst within the inferior pole the left kidney. No echogenic renal stones. There is mild to moderate residual left-sided pelvicaliectasis, improved since CT scan performed 09/26/2014. The superior aspect of the left-sided double-J ureteral stent is not depicted on the present examination.  Bladder:  Appears normal for degree of bladder distention.  IMPRESSION: 1. Post right-sided double-J ureteral stent placement with minimal improvement in persistent mild right-sided pelvicaliectasis. 2. Mild to moderate residual left-sided pelvicaliectasis, improved since abdominal CT performed 09/26/2014. The superior aspect of the left-sided double-J ureteral stent is not depicted on this examination though as ureteral stent  can be poorly visualized with ultrasound further evaluation could be performed with abdominal radiograph as clinically indicated. 3. Bilateral renal cysts as above.   Electronically Signed   By: Sandi Mariscal M.D.   On: 09/27/2014 10:37    I have reviewed the patient's current medications.  Assessment/Plan: Problem #1 acute kidney injury: Possibly obstructive. Presently patient BUN and creatinine continued to increase. However his urine output seems to be getting better.  Problem #2 chronic renal failure: Stage III Problem #3 history of diabetes Problem #4 hyperkalemia: His potassium is low today. Problem #5 bilateral hydronephrosis: His status post bilateral just stent placement and is hydronephrosis seems to be improving Problem #6 anemia: His hemoglobin is better. Patient presently still was gross hematuria. Problem #7 history of UTI Plan: We'll DC IV Lasix We'll continue with hydration. We'll Kcl 40 meq mEq one dose We'll check his basic metabolic panel in the morning.    LOS: 2 days   [name] S 09/28/2014,8:44 AM

## 2014-09-28 NOTE — Addendum Note (Signed)
Addendum  created 09/28/14 0946 by Mickel Baas, CRNA   Modules edited: Notes Section   Notes Section:  File: [id number]

## 2014-09-28 NOTE — Progress Notes (Signed)
[facility] PROGRESS NOTE  [name] GBT:[id number] DOB: [birth year] DOA: 09/26/2014 PCP: [name], MD    Code Status: Full code Family Communication: Family not available; discussed with patient Disposition Plan: Discharge when clinically appropriate.   Consultants:  Urology  Nephrology  Procedures:  Cystoscopy with clot evacuation; left ureteral stent removal; bilateral retrograde pyelograms; bilateral ureteral stent placement, Dr. Collene Mares, 09/26/14.  Antibiotics:  Cipro 4/30>>  HPI/Subjective: The patient has no complaints of abdominal pain, nausea, or vomiting. No complaints of pain around the Foley catheter site.  Objective: Filed Vitals:   09/28/14 1706  BP: 119/56  Pulse: 79  Temp: 98 F (36.7 C)  Resp: 18   oxygen saturation 100%.  Intake/Output Summary (Last 24 hours) at 09/28/14 1711 Last data filed at 09/28/14 1320  Gross per 24 hour  Intake 3946.5 ml  Output   4330 ml  Net -383.5 ml   Filed Weights   09/26/14 1116 09/26/14 2017  Weight: 87.091 kg (192 lb) 85.911 kg (189 lb 6.4 oz)    Exam:   General:  Alert 64 year old Caucasian man in no acute distress.  Cardiovascular: S1, S2, no murmurs rubs or gallops.  Respiratory: Clear to auscultation bilaterally.  Abdomen: Right-sided ileostomy with bag holding greenish bilious fluid; no solid consistency; positive bowel sounds, nontender, nondistended.  GU indwelling Foley catheter in place; gross maroon colored bloody urine in the Foley bag.  Musculoskeletal/extremities: No acute hot red joints. No pedal edema.   Neurologic: The patient is alert and oriented 3.  Data Reviewed: Basic Metabolic Panel:  Recent Labs Lab 09/25/14 0840 09/26/14 1230 09/27/14 0620 09/28/14 0626  NA 135 134* 139 139  K 5.6* 4.6 5.6* 3.4*  CL 108 110 115* 112*  CO2 15* 17* 18* 20*  GLUCOSE 101* 169* 92 105*  BUN 78* 76* 74* 65*  CREATININE 4.25* 3.86* 4.13* 4.28*  CALCIUM 8.6 7.9* 8.0*  7.6*   Liver Function Tests:  Recent Labs Lab 09/25/14 0840 09/26/14 1230 09/27/14 0620  AST 53* 49* 50*  ALT 47 45 46  ALKPHOS 210* 179* 172*  BILITOT 2.3* 1.5* 1.5*  PROT 7.0 6.1 5.9*  ALBUMIN 2.8* 2.3* 2.2*   No results for input(s): LIPASE, AMYLASE in the last 168 hours. No results for input(s): AMMONIA in the last 168 hours. CBC:  Recent Labs Lab 09/25/14 0840 09/26/14 1230 09/27/14 0620 09/28/14 0626  WBC 9.7 5.9 6.4 5.7  NEUTROABS  --  4.8  --   --   HGB 9.4* 7.8* 7.7* 8.7*  HCT 27.7* 23.2* 23.2* 25.2*  MCV 93.3 92.1 93.2 91.3  PLT 78* 47* 47* 53*   Cardiac Enzymes: No results for input(s): CKTOTAL, CKMB, CKMBINDEX, TROPONINI in the last 168 hours. BNP (last 3 results) No results for input(s): BNP in the last 8760 hours.  ProBNP (last 3 results) No results for input(s): PROBNP in the last 8760 hours.  CBG:  Recent Labs Lab 09/27/14 2359 09/28/14 0355 09/28/14 0744 09/28/14 1118 09/28/14 1633  GLUCAP 120* 126* 92 105* 122*    Recent Results (from the past 240 hour(s))  Urine culture     Status: None   Collection Time: 09/26/14  1:00 PM  Result Value Ref Range Status   Specimen Description URINE, RANDOM  Final   Special Requests NONE  Final   Colony Count NO GROWTH Performed at [facility]   Final   Culture NO GROWTH Performed at [facility]   Final   Report  Status 09/27/2014 FINAL  Final     Studies: Dg Retrograde Pyelogram  09/26/2014   CLINICAL DATA:  Renal failure, left-greater-than-right hydronephrosis  EXAM: RETROGRADE PYELOGRAM  COMPARISON:  None.  FINDINGS: Retrograde opacification on the left shows severe hydronephrosis. A wire is passed in a retrograde fashion into the mid pole calyx. Moderate to severe right hydronephrosis also identified.  IMPRESSION: Bilateral hydronephrosis   Electronically Signed   By: Skipper Cliche M.D.   On: 09/26/2014 20:45   US Renal  09/27/2014   CLINICAL DATA:  Acute renal failure.  History renal stones and bilateral ureteral stents.  EXAM: RENAL / URINARY TRACT ULTRASOUND COMPLETE  COMPARISON:  Fluoroscopic guided bilateral retrograde ureteral stent placement - 09/26/2014; CT abdomen pelvis- 09/26/2014  FINDINGS: Right Kidney:  Normal cortical thickness, echogenicity and size, measuring 12.0 cm in length. Note is made of an approximately 2.2 x 2.4 x 2.1 cm anechoic cyst within the superior pole the right kidney which is noted to contain a thin internal echogenic septation (Bosniak 2). No echogenic renal stones. The superior aspect of the recently placed double-J ureteral stent appears appropriately positioned within the right renal pelvis though there is persistent mild right-sided pelvicaliectasis though this appears minimally improved since CT scan performed 09/26/2014.  Left Kidney:  Normal cortical thickness, echogenicity and size, measuring 13.1 cm in length. Note is made of an approximately 2.4 x 2.4 x 2.4 cm anechoic cyst within the inferior pole the left kidney. No echogenic renal stones. There is mild to moderate residual left-sided pelvicaliectasis, improved since CT scan performed 09/26/2014. The superior aspect of the left-sided double-J ureteral stent is not depicted on the present examination.  Bladder:  Appears normal for degree of bladder distention.  IMPRESSION: 1. Post right-sided double-J ureteral stent placement with minimal improvement in persistent mild right-sided pelvicaliectasis. 2. Mild to moderate residual left-sided pelvicaliectasis, improved since abdominal CT performed 09/26/2014. The superior aspect of the left-sided double-J ureteral stent is not depicted on this examination though as ureteral stent can be poorly visualized with ultrasound further evaluation could be performed with abdominal radiograph as clinically indicated. 3. Bilateral renal cysts as above.   Electronically Signed   By: Sandi Mariscal M.D.   On: 09/27/2014 10:37   Dg Abd Portable  1v  09/28/2014   CLINICAL DATA:  Bilateral double-J stent placement for hydronephrosis  EXAM: PORTABLE ABDOMEN - 1 VIEW  COMPARISON:  September 26, 2014 CT abdomen and pelvis and retrograde pyelogram study  FINDINGS: The left double-J stent loops proximally, likely in a rather markedly dilated collecting system based on recent studies. The tip of the left double-J stent is in the region of the bladder. The right double-J stent has its proximal aspect at L1-2 and distal aspect in the region of the bladder. There is a generalized paucity of bowel gas. No free air seen.  IMPRESSION: Double-J stent positions as described. The proximal right stent is in the normal flank position. The left stent proximally appears to reside in a dilated renal pelvis in a kidney this somewhat inferiorly positioned. Distally, both stents are felt to be within the urinary bladder.  There is a paucity of bowel gas pattern a paucity of gas may be seen normally but also may be seen with enteritis or ileus. Bowel obstruction not felt to be likely.   Electronically Signed   By: Lowella Grip III M.D.   On: 09/28/2014 11:07    Scheduled Meds: . ciprofloxacin  500 mg Oral Q breakfast  .  folic acid  1 mg Oral Daily  . hydrocortisone cream  1 application Topical BID  . insulin aspart  0-9 Units Subcutaneous 6 times per day  . magnesium oxide  400 mg Oral Daily  . pantoprazole  40 mg Oral Daily   Continuous Infusions: . sodium chloride 135 mL/hr at 09/28/14 1023   Assessment and plan:  Principal Problem:   Bilateral hydronephrosis Active Problems:   Gross hematuria   Acute kidney injury   Pancytopenia, acquired   Thrombocytopenia   Acute blood loss anemia   Anemia of chronic disease   Obstructive uropathy   Recurrent UTI   DM type 2 (diabetes mellitus, type 2)   Adenocarcinoma of rectum   Hyperkalemia   1. Acute kidney injury, likely from obstructive uropathy. The patient's baseline creatinine from early March 2016 was  within normal limits. Over the last couple weeks, it has progressively increased. On admission, his creatinine was 3.86. He was started on vigorous IV fluids and urologist, Dr. Collene Mares performed of bilateral ureteral stent placement. Nephrology was consulted. Dr. Lowanda Foster agreed with IV fluid hydration and started the patient on Lasix 100 mg IV twice daily. The Lasix was discontinued. The patient's creatinine has increased some. Renal ultrasound on 4/30 revealed Post right-sided double-J ureteral stent placement with minimal improvement in persistent mild right-sided pelvicaliectasis;Mild to moderate residual left-sided pelvicaliectasis, improved since abdominal CT performed 09/26/2014.  Hyperkalemia; hypokalemia. The patient's serum potassium was 5.6 on admission. Kayexalate was ordered by nephrology. His serum potassium fell to 3.4. Potassium chloride supplement given.  Obstructive uropathy. The patient has a history of left hydronephrosis and had a stent placement by urology in the past. On admission, CT of his abdomen and pelvis revealed severe left hydronephrosis and new mild right hydronephrosis. Following admission, Dr. Collene Mares performed cystoscopy, removal of the previous left stent and placement of bilateral ureteral stents. Follow-up renal ultrasound results noted above.  Gross hematuria/pyuria/bacteriuria-treating as UTI. Urine culture ordered and pending. Cipro was started.   Acute and chronic blood loss anemia, superimposed on anemia of chronic disease. 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 His hemoglobin was 9.4 on 4/28 and 7.8 at the time of admission. He was transfused 2 units of packed red blood cells with minimal improvement. We will transfuse another unit today. We'll continue to monitor.  Acquired pancytopenia/thrombocytopenia. In part, from chemotherapy and from bleeding. His  platelet count was 78 on admission and has drifted down to 47 in the setting of gross GU bleeding. He was transfused 1 unit of platelets with minimal improvement. Will transfuse another unit of platelets and continue to monitor.  Type 2 diabetes mellitus. Apparently, he is treated with diet alone now. We'll continue sliding scale NovoLog, sensitive scale. His CBGs are well controlled.  Metastatic, stage IV adenocarcinoma of the rectum. The patient is followed by oncology and radiation oncology. He is being treated with XRT and single agent chemotherapy per review of the notes and per patient history. Apparently, bone marrow biopsy was recommended but the patient has refused. Will let oncology know of his hospitalization on Monday.   Time spent: 35 minutes    [facility] Pager [phone]. If 7PM-7AM, please contact night-coverage at [email or web address], password [facility] 09/28/2014, 5:11 PM  LOS: 2 days

## 2014-09-29 ENCOUNTER — Encounter (HOSPITAL_COMMUNITY): Payer: Self-pay | Admitting: Urology

## 2014-09-29 DIAGNOSIS — D696 Thrombocytopenia, unspecified: Secondary | ICD-10-CM

## 2014-09-29 LAB — BASIC METABOLIC PANEL
ANION GAP: 6 (ref 5–15)
BUN: 53 mg/dL — ABNORMAL HIGH (ref 6–20)
CALCIUM: 7.7 mg/dL — AB (ref 8.9–10.3)
CHLORIDE: 117 mmol/L — AB (ref 101–111)
CO2: 19 mmol/L — AB (ref 22–32)
Creatinine, Ser: 3.39 mg/dL — ABNORMAL HIGH (ref 0.61–1.24)
GFR calc Af Amer: 21 mL/min — ABNORMAL LOW (ref >60)
GFR calc non Af Amer: 18 mL/min — ABNORMAL LOW (ref >60)
Glucose, Bld: 93 mg/dL (ref 70–99)
Potassium: 3.9 mmol/L (ref 3.5–5.1)
Sodium: 142 mmol/L (ref 135–145)

## 2014-09-29 LAB — TYPE AND SCREEN
ABO/RH(D): A POS
ANTIBODY SCREEN: NEGATIVE
UNIT DIVISION: 0
Unit division: 0
Unit division: 0

## 2014-09-29 LAB — CBC
HEMATOCRIT: 26.5 % — AB (ref 39.0–52.0)
Hemoglobin: 9.1 g/dL — ABNORMAL LOW (ref 13.0–17.0)
MCH: 31.2 pg (ref 26.0–34.0)
MCHC: 34.3 g/dL (ref 30.0–36.0)
MCV: 90.8 fL (ref 78.0–100.0)
Platelets: 49 10*3/uL — ABNORMAL LOW (ref 150–400)
RBC: 2.92 MIL/uL — ABNORMAL LOW (ref 4.22–5.81)
RDW: 15.4 % (ref 11.5–15.5)
WBC: 6 10*3/uL (ref 4.0–10.5)

## 2014-09-29 LAB — GLUCOSE, CAPILLARY
GLUCOSE-CAPILLARY: 92 mg/dL (ref 70–99)
GLUCOSE-CAPILLARY: 151 mg/dL — AB (ref 70–99)
Glucose-Capillary: 76 mg/dL (ref 70–99)
Glucose-Capillary: 90 mg/dL (ref 70–99)

## 2014-09-29 LAB — PREPARE PLATELET PHERESIS: Unit division: 0

## 2014-09-29 LAB — PROTIME-INR
INR: 1.24 (ref 0.00–1.49)
PROTHROMBIN TIME: 15.7 s — AB (ref 11.6–15.2)

## 2014-09-29 LAB — PHOSPHORUS: PHOSPHORUS: 4.9 mg/dL — AB (ref 2.5–4.6)

## 2014-09-29 MED ORDER — SODIUM BICARBONATE 650 MG PO TABS
650.0000 mg | ORAL_TABLET | Freq: Two times a day (BID) | ORAL | Status: DC
Start: 2014-09-29 — End: 2014-09-30
  Administered 2014-09-29 – 2014-09-30 (×3): 650 mg via ORAL
  Filled 2014-09-29 (×3): qty 1

## 2014-09-29 NOTE — Care Management Note (Signed)
Case Management Note  Patient Details  Name: [name] MRN: [id number] Date of Birth: 03/25/1951  Subjective/Objective:                    Action/Plan: Pt is from home with wife. Pt has cane, walker, lift chair at home. Pt has no wheelchair or respiratory DME's prior to admission. Pt has used [facility] in past and would want them again if needed. Pt plans to discharge home, anticipate need for HH, especially if DC'd with foley. May need PT eval prior to discharge. Will cont to follow.  Expected Discharge Date:  10/01/14               Expected Discharge Plan:  [place]  In-House Referral:     Discharge planning Services  CM Consult  Post Acute Care Choice:  Home Health Choice offered to:  Patient  DME Arranged:    DME Agency:     HH Arranged:  RN [place] Agency:  [place]  Status of Service:   Continue to follow.   Medicare Important Message Given:    Date Medicare IM Given:    Medicare IM give by:    Date Additional Medicare IM Given:    Additional Medicare Important Message give by:     If discussed at [place] of Stay Meetings, dates discussed:    Additional Comments:  Sherald Barge, RN 09/29/2014, 11:58 AM

## 2014-09-29 NOTE — Progress Notes (Signed)
[facility] PROGRESS NOTE  [name] NGE:[id number] DOB: 1951-01-10 DOA: 09/26/2014 PCP: [name], MD    Code Status: Full code Family Communication: discussed with wife Disposition Plan: Discharge when clinically appropriate.   Consultants:  Urology  Nephrology  Procedures:  Cystoscopy with clot evacuation; left ureteral stent removal; bilateral retrograde pyelograms; bilateral ureteral stent placement, Dr. Collene Mares, 09/26/14.  Antibiotics:  Cipro 4/30>>  HPI/Subjective: The patient sometimes has some pressure around the entry of the Foley which he believes may be secondary to blood clots.  Objective: Filed Vitals:   09/29/14 0630  BP: 120/65  Pulse: 62  Temp: 98.1 F (36.7 C)  Resp: 18   oxygen saturation 100%.  Intake/Output Summary (Last 24 hours) at 09/29/14 1246 Last data filed at 09/29/14 0400  Gross per 24 hour  Intake   3895 ml  Output   2100 ml  Net   1795 ml   Filed Weights   09/26/14 1116 09/26/14 2017  Weight: 87.091 kg (192 lb) 85.911 kg (189 lb 6.4 oz)    Exam:   General:  Alert 64 year old Caucasian man in no acute distress.  Cardiovascular: S1, S2, no murmurs rubs or gallops.  Respiratory: Clear to auscultation bilaterally.  Abdomen: Right-sided ileostomy with bag holding greenish bilious fluid/stool; positive bowel sounds, nontender, nondistended.  GU indwelling Foley catheter in place; gross maroon colored bloody urine in the Foley bag.  Musculoskeletal/extremities: No acute hot red joints. No pedal edema.   Neurologic: The patient is alert and oriented 3.  Data Reviewed: Basic Metabolic Panel:  Recent Labs Lab 09/25/14 0840 09/26/14 1230 09/27/14 0620 09/28/14 0626 09/29/14 0735 09/29/14 0739  NA 135 134* 139 139  --  142  K 5.6* 4.6 5.6* 3.4*  --  3.9  CL 108 110 115* 112*  --  117*  CO2 15* 17* 18* 20*  --  19*  GLUCOSE 101* 169* 92 105*  --  93  BUN 78* 76* 74* 65*  --  53*  CREATININE 4.25*  3.86* 4.13* 4.28*  --  3.39*  CALCIUM 8.6 7.9* 8.0* 7.6*  --  7.7*  PHOS  --   --   --   --  4.9*  --    Liver Function Tests:  Recent Labs Lab 09/25/14 0840 09/26/14 1230 09/27/14 0620  AST 53* 49* 50*  ALT 47 45 46  ALKPHOS 210* 179* 172*  BILITOT 2.3* 1.5* 1.5*  PROT 7.0 6.1 5.9*  ALBUMIN 2.8* 2.3* 2.2*   No results for input(s): LIPASE, AMYLASE in the last 168 hours. No results for input(s): AMMONIA in the last 168 hours. CBC:  Recent Labs Lab 09/25/14 0840 09/26/14 1230 09/27/14 0620 09/28/14 0626 09/29/14 0739  WBC 9.7 5.9 6.4 5.7 6.0  NEUTROABS  --  4.8  --   --   --   HGB 9.4* 7.8* 7.7* 8.7* 9.1*  HCT 27.7* 23.2* 23.2* 25.2* 26.5*  MCV 93.3 92.1 93.2 91.3 90.8  PLT 78* 47* 47* 53* 49*   Cardiac Enzymes: No results for input(s): CKTOTAL, CKMB, CKMBINDEX, TROPONINI in the last 168 hours. BNP (last 3 results) No results for input(s): BNP in the last 8760 hours.  ProBNP (last 3 results) No results for input(s): PROBNP in the last 8760 hours.  CBG:  Recent Labs Lab 09/28/14 1118 09/28/14 1633 09/28/14 2031 09/29/14 0751 09/29/14 1116  GLUCAP 105* 122* 137* 92 151*    Recent Results (from the past 240 hour(s))  Urine culture  Status: None   Collection Time: 09/26/14  1:00 PM  Result Value Ref Range Status   Specimen Description URINE, RANDOM  Final   Special Requests NONE  Final   Colony Count NO GROWTH Performed at [facility]   Final   Culture NO GROWTH Performed at [facility]   Final   Report Status 09/27/2014 FINAL  Final     Studies: Dg Abd Portable 1v  09/28/2014   CLINICAL DATA:  Bilateral double-J stent placement for hydronephrosis  EXAM: PORTABLE ABDOMEN - 1 VIEW  COMPARISON:  September 26, 2014 CT abdomen and pelvis and retrograde pyelogram study  FINDINGS: The left double-J stent loops proximally, likely in a rather markedly dilated collecting system based on recent studies. The tip of the left double-J stent is in  the region of the bladder. The right double-J stent has its proximal aspect at L1-2 and distal aspect in the region of the bladder. There is a generalized paucity of bowel gas. No free air seen.  IMPRESSION: Double-J stent positions as described. The proximal right stent is in the normal flank position. The left stent proximally appears to reside in a dilated renal pelvis in a kidney this somewhat inferiorly positioned. Distally, both stents are felt to be within the urinary bladder.  There is a paucity of bowel gas pattern a paucity of gas may be seen normally but also may be seen with enteritis or ileus. Bowel obstruction not felt to be likely.   Electronically Signed   By: Lowella Grip III M.D.   On: 09/28/2014 11:07    Scheduled Meds: . ciprofloxacin  500 mg Oral Q breakfast  . folic acid  1 mg Oral Daily  . hydrocortisone cream  1 application Topical BID  . insulin aspart  0-9 Units Subcutaneous 6 times per day  . magnesium oxide  400 mg Oral Daily  . pantoprazole  40 mg Oral Daily  . sodium bicarbonate  650 mg Oral BID   Continuous Infusions: . sodium chloride 135 mL/hr at 09/29/14 1050   Assessment and plan:  Principal Problem:   Bilateral hydronephrosis Active Problems:   Gross hematuria   Acute kidney injury   Pancytopenia, acquired   Thrombocytopenia   Acute blood loss anemia   Anemia of chronic disease   Obstructive uropathy   Recurrent UTI   DM type 2 (diabetes mellitus, type 2)   Adenocarcinoma of rectum   Hyperkalemia   1. Acute kidney injury, likely from obstructive uropathy. The patient's baseline creatinine from early March 2016 was within normal limits. Over the last couple weeks, it has progressively increased. On admission, his creatinine was 3.86. He was started on vigorous IV fluids and urologist, Dr. Collene Mares performed cystoscopy and bilateral ureteral stent placement. Nephrology was consulted. Dr. Lowanda Foster agreed with IV fluid hydration and started the  patient on Lasix 100 mg IV twice daily. The Lasix was discontinued. The patient's creatinine has decreased some compared to yesterday. Renal ultrasound on 4/30 revealed Post right-sided double-J ureteral stent placement with minimal improvement in persistent mild right-sided pelvicaliectasis;Mild to moderate residual left-sided pelvicaliectasis, improved since abdominal CT performed 09/26/2014.  Hyperkalemia; hypokalemia. The patient's serum potassium was 5.6 on admission. Kayexalate was ordered by nephrology. His serum potassium fell to 3.4. Potassium chloride supplement given. Serum potassium is better.  Obstructive uropathy. The patient has a history of left hydronephrosis and had a stent placement by urology in the past. On admission, CT of his abdomen and pelvis revealed  severe left hydronephrosis and new mild right hydronephrosis. Following admission, Dr. Collene Mares performed cystoscopy, removal of the previous left stent and placement of bilateral ureteral stents. Follow-up renal ultrasound results noted above.  Gross hematuria/pyuria/bacteriuria-treating as UTI. Urine culture ordered and pending. Cipro was started.   Acute and chronic blood loss anemia, superimposed on anemia of chronic disease. 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 His hemoglobin was 9.4 on 4/28 and 7.8 at the time of admission. He has been transfused a total of 3 units of packed red blood cells with improvement. We'll continue to monitor her CBC daily or more frequently if needed as he continues to have gross hematuria.  Acquired pancytopenia/thrombocytopenia. In part, from chemotherapy and from bleeding. His platelet count was 78 on admission and has drifted down to 47 in the setting of gross GU bleeding. He was transfused a total of 2 units of platelets with minimal improvement.  We'll continue to monitor. The patient may  need further platelet transfusions.  Type 2 diabetes mellitus. Apparently, he is treated with diet alone now. We'll continue sliding scale NovoLog, sensitive scale. His CBGs are well controlled.  Metastatic, stage IV adenocarcinoma of the rectum. The patient is followed by oncology and radiation oncology. He is being treated with XRT and single agent chemotherapy per review of the notes and per patient history. Apparently, bone marrow biopsy was recommended but the patient has refused. Oncology was notified of the patient's hospitalization.   Time spent: 30 minutes    [place] [facility] Pager [phone]. If 7PM-7AM, please contact night-coverage at [email or web address], password [facility] 09/29/2014, 12:46 PM  LOS: 3 days

## 2014-09-29 NOTE — Op Note (Signed)
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  PROCEDURE: 1. Cystoscopy with clot evacuation. 2. Exchange of left ureteral stent. 3. Bilateral retrograde pyelogram and interpretation. 4. Placement of right ureteral stent.  Stents are all 6 x 26, no     tether.  ESTIMATED BLOOD LOSS:  200 mL of formed old clot, minimal new blood.  DRAINS:  A 24-French Foley catheter straight drained 10 mL sterile in a balloon.  FINDINGS: 1. Large volume of formed clot and likely necrotic tumor debris in     urinary bladder.  This was irrigated and set aside for discard. 2. Malpositioning of in situ left ureteral stent.  This was removed     and set aside for discard. 3. Left greater than right hydronephrosis. 4. Excellent placement of bilateral ureteral stents, proximal end     renal pelvis, distal end urinary bladder.  INDICATION:  Mr. 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  He has managed a chronic left ureteral stenting most recently.  He has had an acute renal failure with a creatinine most recently above 4 and worsening left greater than right hydronephrosis and evidence of malposition of left ureteral stent.  He was to have his stent replaced next week; however, due to his worsening renal insufficiency, this was warranted in the emergency setting.  Informed consent was signed and placed in the medical record.  DESCRIPTION OF PROCEDURE:  The patient  being Cayden Granholm was verified. Procedure being cystoscopy and bilateral ureteral stent placement was confirmed.  Procedure was carried out.  Time-out was performed. Intravenous antibiotics were administered.  General endotracheal anesthesia was introduced.  The patient was placed into a low lithotomy position.  Sterile field was created by prepping and draping the patient's penis, perineum, and proximal thighs using iodine x3.  Next, cystourethroscopy was performed using a 23-French rigid cystoscope with 30-degree offset lens.  Inspection of the anterior-posterior urethra was unremarkable.  Inspection of the bladder revealed large amount of formed clot and likely tumor debris within the urinary bladder.  A Toomey syringe was used to irrigate roughly 200 mL of old clot and set aside for discard.  Next, the distal end of the left ureteral stent was grasped and brought out in its entirety, set aside for discard.  The left ureteral orifice was cannulated with a 6-French Foley catheter and left retrograde pyelogram was obtained.  Left retrograde pyelogram demonstrated a single left ureter, single system left kidney.  There was some tortuosity noted.  There was severe hydronephrosis.  A 0.038 Glidewire was advanced at the level of the upper pole over which, a new 6 x 26 Contour  ureteral stent was placed. Good proximal and distal deployment were noted.  Efflux of urine was seen around into the distal end of the stent.  Attention was then directed to the right side.  The right ureteral orifice was cannulated with a 6-French Foley catheter and right retrograde pyelogram was obtained.  Right retrograde pyelogram demonstrated a single right ureter, single system right kidney.  There was significant hydronephrosis without uronephrosis.  This was less severe than on the right.  A 0.038 Glidewire was advanced at the level of the upper pole over which, a new 6 x 26 Contour ureteral stent was  placed using cystoscopic and fluoroscopic guidance.  Good proximal and distal deployment were noted. Efflux of urine was seen around into the distal end of the stent.  The cystoscope was then exchanged for a new 24-French Foley catheter with 10 mL of urine in the balloon.  This had to be straight-drained.  Procedure was then terminated.  The patient tolerated the procedure well.  There were no immediate periprocedural complications.  The patient was taken to postanesthesia care unit in stable condition.          ______________________________ Alexis Frock, MD     TM/MEDQ  D:  09/26/2014  T:  09/27/2014  Job:  [id number]

## 2014-09-29 NOTE — Progress Notes (Signed)
Subjective: Interval History: The patient offers no complaints. His appetite is still good and no difficulty breathing.  Objective: Vital signs in last 24 hours: Temp:  [97.9 F (36.6 C)-98.7 F (37.1 C)] 98.1 F (36.7 C) (05/02 0630) Pulse Rate:  [62-80] 62 (05/02 0630) Resp:  [18] 18 (05/02 0630) BP: (112-122)/(50-65) 120/65 mmHg (05/02 0630) SpO2:  [99 %-100 %] 100 % (05/02 0630) Weight change:   Intake/Output from previous day: 05/01 0701 - 05/02 0700 In: 4375 [P.O.:920; I.V.:3105; Blood:350] Out: 2300 [Urine:2100; Stool:200] Intake/Output this shift:    Generally patient is alert and in no apparent distress. Chest is clear to auscultation, no rales rhonchi or egophony. His heart exam revealed regular rate and rhythm no murmur no S3 Abdomen: Soft nontender Extremities no edema  Lab Results:  Recent Labs  09/28/14 0626 09/29/14 0739  WBC 5.7 6.0  HGB 8.7* 9.1*  HCT 25.2* 26.5*  PLT 53* 49*   BMET:   Recent Labs  09/28/14 0626 09/29/14 0739  NA 139 142  K 3.4* 3.9  CL 112* 117*  CO2 20* 19*  GLUCOSE 105* 93  BUN 65* 53*  CREATININE 4.28* 3.39*  CALCIUM 7.6* 7.7*   No results for input(s): PTH in the last 72 hours. Iron Studies: No results for input(s): IRON, TIBC, TRANSFERRIN, FERRITIN in the last 72 hours.  Studies/Results: Dg Abd Portable 1v  09/28/2014   CLINICAL DATA:  Bilateral double-J stent placement for hydronephrosis  EXAM: PORTABLE ABDOMEN - 1 VIEW  COMPARISON:  September 26, 2014 CT abdomen and pelvis and retrograde pyelogram study  FINDINGS: The left double-J stent loops proximally, likely in a rather markedly dilated collecting system based on recent studies. The tip of the left double-J stent is in the region of the bladder. The right double-J stent has its proximal aspect at L1-2 and distal aspect in the region of the bladder. There is a generalized paucity of bowel gas. No free air seen.  IMPRESSION: Double-J stent positions as described. The  proximal right stent is in the normal flank position. The left stent proximally appears to reside in a dilated renal pelvis in a kidney this somewhat inferiorly positioned. Distally, both stents are felt to be within the urinary bladder.  There is a paucity of bowel gas pattern a paucity of gas may be seen normally but also may be seen with enteritis or ileus. Bowel obstruction not felt to be likely.   Electronically Signed   By: Lowella Grip III M.D.   On: 09/28/2014 11:07    I have reviewed the patient's current medications.  Assessment/Plan: Problem #1 acute kidney injury: Possibly obstructive. Presently his BUN and creatinine is improving. Presently patient doesn't have any uremic sign and symptoms. Problem #2 chronic renal failure: Stage III Problem #3 history of diabetes Problem #4 hyperkalemia: His potassium has corrected. Problem #5 bilateral hydronephrosis: His status post bilateral just stent placement and is hydronephrosis. Presently patient with reasonable urine output. Problem #6 anemia: His hemoglobin is better. Patient presently still was gross hematuria. Problem #7 history of UTI : Patient is presently afebrile and his white blood cell count is normal. Problem #8. Low CO2 possibly metabolic. Plan: We'll DC IV Lasix We'll continue with hydration. We'll start patient on sodium bicarbonate 650 mg by mouth twice a day We'll check his basic metabolic panel in the morning.    LOS: 3 days   Treydon Henricks S 09/29/2014,10:09 AM

## 2014-09-30 LAB — GLUCOSE, CAPILLARY
GLUCOSE-CAPILLARY: 100 mg/dL — AB (ref 70–99)
GLUCOSE-CAPILLARY: 101 mg/dL — AB (ref 70–99)
GLUCOSE-CAPILLARY: 61 mg/dL — AB (ref 70–99)
GLUCOSE-CAPILLARY: 90 mg/dL (ref 70–99)
GLUCOSE-CAPILLARY: 90 mg/dL (ref 70–99)
GLUCOSE-CAPILLARY: 94 mg/dL (ref 70–99)
Glucose-Capillary: 77 mg/dL (ref 70–99)
Glucose-Capillary: 79 mg/dL (ref 70–99)

## 2014-09-30 LAB — CBC
HEMATOCRIT: 27.4 % — AB (ref 39.0–52.0)
HEMOGLOBIN: 9.4 g/dL — AB (ref 13.0–17.0)
MCH: 31.8 pg (ref 26.0–34.0)
MCHC: 34.3 g/dL (ref 30.0–36.0)
MCV: 92.6 fL (ref 78.0–100.0)
PLATELETS: 44 10*3/uL — AB (ref 150–400)
RBC: 2.96 MIL/uL — ABNORMAL LOW (ref 4.22–5.81)
RDW: 15.7 % — ABNORMAL HIGH (ref 11.5–15.5)
WBC: 6.8 10*3/uL (ref 4.0–10.5)

## 2014-09-30 LAB — BASIC METABOLIC PANEL
Anion gap: 6 (ref 5–15)
BUN: 41 mg/dL — ABNORMAL HIGH (ref 6–20)
CHLORIDE: 117 mmol/L — AB (ref 101–111)
CO2: 18 mmol/L — AB (ref 22–32)
Calcium: 7.6 mg/dL — ABNORMAL LOW (ref 8.9–10.3)
Creatinine, Ser: 2.63 mg/dL — ABNORMAL HIGH (ref 0.61–1.24)
GFR calc Af Amer: 29 mL/min — ABNORMAL LOW (ref >60)
GFR calc non Af Amer: 25 mL/min — ABNORMAL LOW (ref >60)
GLUCOSE: 94 mg/dL (ref 70–99)
POTASSIUM: 3.9 mmol/L (ref 3.5–5.1)
SODIUM: 141 mmol/L (ref 135–145)

## 2014-09-30 MED ORDER — HYDRALAZINE HCL 20 MG/ML IJ SOLN: 5.0000 mg | INTRAMUSCULAR | Status: DC | PRN

## 2014-09-30 MED ORDER — FUROSEMIDE 40 MG PO TABS
40.0000 mg | ORAL_TABLET | Freq: Every day | ORAL | Status: DC
  Administered 2014-09-30 – 2014-10-02 (×3): 40 mg via ORAL
  Filled 2014-09-30 (×3): qty 1

## 2014-09-30 MED ORDER — SODIUM BICARBONATE 650 MG PO TABS
1300.0000 mg | ORAL_TABLET | Freq: Two times a day (BID) | ORAL | Status: DC
  Administered 2014-09-30 – 2014-10-02 (×4): 1300 mg via ORAL
  Filled 2014-09-30 (×4): qty 2

## 2014-09-30 NOTE — Progress Notes (Addendum)
[facility] PROGRESS NOTE  [name] BDZ:[id number] DOB: [birth year] DOA: 09/26/2014 PCP: [name], MD    Code Status: Full code Family Communication: discussed with wife on 09/29/14. Disposition Plan: Discharge when clinically appropriate.   Consultants:  Urology  Nephrology  Procedures:  Cystoscopy with clot evacuation; left ureteral stent removal; bilateral retrograde pyelograms; bilateral ureteral stent placement, Dr. Collene Mares, 09/26/14.  Antibiotics:  Cipro 4/30>>  HPI/Subjective: The patient ambulated with the nursing staff today. He still continues to have intermittent pressure coming through the Foley catheter at the urethral site; not present currently.  Objective: Filed Vitals:   09/30/14 1337  BP: 186/74  Pulse: 76  Temp: 98.9 F (37.2 C)  Resp: 20   oxygen saturation 100%.  Intake/Output Summary (Last 24 hours) at 09/30/14 1532 Last data filed at 09/30/14 1400  Gross per 24 hour  Intake 3987.08 ml  Output   1450 ml  Net 2537.08 ml   Filed Weights   09/26/14 1116 09/26/14 2017  Weight: 87.091 kg (192 lb) 85.911 kg (189 lb 6.4 oz)    Exam:   General:  Alert 64 year old Caucasian man in no acute distress.  Cardiovascular: S1, S2, no murmurs rubs or gallops.  Respiratory: Clear to auscultation bilaterally.  Abdomen: Right-sided ileostomy with bag holding greenish bilious fluid/stool; positive bowel sounds, nontender, nondistended.  GU: Indwelling Foley catheter noted with no surrounding bloody drainage around the urethral meatus; gross maroon colored bloody urine in the Foley bag.  Musculoskeletal/extremities: No acute hot red joints. No pedal edema.   Neurologic: The patient is alert and oriented 3.  Data Reviewed: Basic Metabolic Panel:  Recent Labs Lab 09/26/14 1230 09/27/14 0620 09/28/14 0626 09/29/14 0735 09/29/14 0739 09/30/14 0725  NA 134* 139 139  --  142 141  K 4.6 5.6* 3.4*  --  3.9 3.9  CL 110 115* 112*   --  117* 117*  CO2 17* 18* 20*  --  19* 18*  GLUCOSE 169* 92 105*  --  93 94  BUN 76* 74* 65*  --  53* 41*  CREATININE 3.86* 4.13* 4.28*  --  3.39* 2.63*  CALCIUM 7.9* 8.0* 7.6*  --  7.7* 7.6*  PHOS  --   --   --  4.9*  --   --    Liver Function Tests:  Recent Labs Lab 09/25/14 0840 09/26/14 1230 09/27/14 0620  AST 53* 49* 50*  ALT 47 45 46  ALKPHOS 210* 179* 172*  BILITOT 2.3* 1.5* 1.5*  PROT 7.0 6.1 5.9*  ALBUMIN 2.8* 2.3* 2.2*   No results for input(s): LIPASE, AMYLASE in the last 168 hours. No results for input(s): AMMONIA in the last 168 hours. CBC:  Recent Labs Lab 09/26/14 1230 09/27/14 0620 09/28/14 0626 09/29/14 0739 09/30/14 0725  WBC 5.9 6.4 5.7 6.0 6.8  NEUTROABS 4.8  --   --   --   --   HGB 7.8* 7.7* 8.7* 9.1* 9.4*  HCT 23.2* 23.2* 25.2* 26.5* 27.4*  MCV 92.1 93.2 91.3 90.8 92.6  PLT 47* 47* 53* 49* 44*   Cardiac Enzymes: No results for input(s): CKTOTAL, CKMB, CKMBINDEX, TROPONINI in the last 168 hours. BNP (last 3 results) No results for input(s): BNP in the last 8760 hours.  ProBNP (last 3 results) No results for input(s): PROBNP in the last 8760 hours.  CBG:  Recent Labs Lab 09/29/14 2012 09/30/14 0009 09/30/14 0419 09/30/14 0802 09/30/14 1125  GLUCAP 90 77 79 90 94  Recent Results (from the past 240 hour(s))  Urine culture     Status: None   Collection Time: 09/26/14  1:00 PM  Result Value Ref Range Status   Specimen Description URINE, RANDOM  Final   Special Requests NONE  Final   Colony Count NO GROWTH Performed at [facility]   Final   Culture NO GROWTH Performed at [facility]   Final   Report Status 09/27/2014 FINAL  Final     Studies: No results found.  Scheduled Meds: . ciprofloxacin  500 mg Oral Q breakfast  . folic acid  1 mg Oral Daily  . furosemide  40 mg Oral Daily  . hydrocortisone cream  1 application Topical BID  . insulin aspart  0-9 Units Subcutaneous 6 times per day  .  magnesium oxide  400 mg Oral Daily  . pantoprazole  40 mg Oral Daily  . sodium bicarbonate  650 mg Oral BID   Continuous Infusions: . 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 The patient also presented with gross hematuria which has been subacute to chronic. CT of his abdomen and pelvis revealed severe left hydronephrosis with the left stent in place and new mild right hydronephrosis. Urologist, Dr. Collene Mares performed cystoscopy and bilateral ureteral stent placement. Nephrologist, Dr. Lowanda Foster was consulted and started the patient on IV fluids and Lasix with adjustments. Patient's creatinine is improving progressively. The patient has been transfused a total of 3 units of packed red blood cells and 2 units of platelets for persistent blood loss anemia from gross hematuria. 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 Once his creatinine improves more and stabilizes and when his hemoglobin and platelet count stabilizes, he should be ready for discharge in the next several days.  Assessment and plan: Principal Problem:   Bilateral hydronephrosis Active Problems:   Gross hematuria   Acute kidney injury   Pancytopenia, acquired   Thrombocytopenia   Acute blood loss anemia   Anemia of chronic disease   Obstructive uropathy   Recurrent UTI   DM type 2 (diabetes mellitus, type 2)   Adenocarcinoma of rectum   Hyperkalemia   1. Acute kidney injury, likely from obstructive uropathy. The patient's baseline creatinine from early March 2016 was within normal limits. Over the last couple weeks, it has progressively increased. On  admission, it creatinine was 3.86. He was started on vigorous IV fluids and urologist, Dr. Collene Mares performed cystoscopy and bilateral ureteral stent placement. Nephrology was consulted. Dr. Lowanda Foster agreed with IV fluid hydration and started the patient on Lasix 100 mg IV twice daily. The Lasix was discontinued and then restarted at lower dosing.  Renal ultrasound on 4/30 revealed Post right-sided double-J ureteral stent placement with minimal improvement in persistent mild right-sided pelvicaliectasis;Mild to moderate residual left-sided pelvicaliectasis, improved since abdominal CT performed 09/26/2014. -The patient's creatinine is improving progressively.  Hyperkalemia; hypokalemia. The patient's serum potassium was 5.6 on admission. Kayexalate was ordered by nephrology. His serum potassium fell to 3.4. Potassium chloride supplement given. Serum potassium is better.  Obstructive uropathy/hydronephrosis. The patient has a history of left hydronephrosis and had a stent placement by urology in the past. On admission,  CT of his abdomen and pelvis revealed severe left hydronephrosis and new mild right hydronephrosis. Following admission, Dr. Collene Mares performed cystoscopy, removal of the previous left stent and placement of bilateral ureteral stents. Follow-up renal ultrasound results noted above.  Gross hematuria/pyuria/bacteriuria-treating as UTI. Urine culture ordered and is negative to date Cipro was started empirically.   Acute and chronic blood loss anemia, superimposed on anemia of chronic disease. 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 His hemoglobin was 9.4 on 4/28 and 7.8 at the time of admission. He has been transfused a total of 3 units of packed red blood cells with improvement. We'll continue to monitor his CBC daily or more frequently if needed as he continues to have gross hematuria.  Acquired  pancytopenia/thrombocytopenia. In part, from chemotherapy and from bleeding. His platelet count was 78 on admission and has drifted down to 47 in the setting of gross GU bleeding. His baseline platelet count appears to be 49-58. He was transfused a total of 2 units of platelets with minimal improvement.  We'll continue to monitor. The patient may need further platelet transfusions, particularly if his platelet count falls to 40 or below and if his hemoglobin drifts downward again.  Type 2 diabetes mellitus. Apparently, he is treated with diet alone now. We'll continue sliding scale NovoLog, sensitive scale. His CBGs are well controlled.  Metastatic, stage IV adenocarcinoma of the rectum. The patient is followed by oncology and radiation oncology. He is being treated with XRT and single agent chemotherapy per review of the notes and per patient history. Apparently, bone marrow biopsy was recommended but the patient had refused. Oncology was notified of the patient's hospitalization.  Isolated elevated blood pressure; he has a history of hypertension. We'll add when necessary hydralazine and continue to monitor for need for oral medication.    Time spent: 25 minutes    [facility] Pager [phone]. If 7PM-7AM, please contact night-coverage at [email or web address], password [facility] 09/30/2014, 3:32 PM  LOS: 4 days

## 2014-09-30 NOTE — Care Management Utilization Note (Signed)
UR completed 

## 2014-09-30 NOTE — Progress Notes (Signed)
MD informed of patient's blood sugar of 61, carbs given po and blood sugar increased to 90.

## 2014-09-30 NOTE — Progress Notes (Signed)
Subjective: Interval History: Complains of some abdominal pain. No nausea or vomiting. Patient denies any difficulty in breathing  Objective: Vital signs in last 24 hours: Temp:  [97.5 F (36.4 C)-98.8 F (37.1 C)] 98.7 F (37.1 C) (05/03 0603) Pulse Rate:  [66-73] 66 (05/03 0603) Resp:  [18] 18 (05/03 0603) BP: (107-131)/(50-64) 123/64 mmHg (05/03 0603) SpO2:  [100 %] 100 % (05/03 0603) Weight change:   Intake/Output from previous day: 05/02 0701 - 05/03 0700 In: 4420 [P.O.:1180; I.V.:3240] Out: 1950 [Urine:1550; Stool:400] Intake/Output this shift:    Generally patient is alert and in no apparent distress. Chest is clear to auscultation, no rales rhonchi or egophony. His heart exam revealed regular rate and rhythm no murmur no S3 Abdomen: Soft nontender Extremities no edema  Lab Results:  Recent Labs  09/28/14 0626 09/29/14 0739  WBC 5.7 6.0  HGB 8.7* 9.1*  HCT 25.2* 26.5*  PLT 53* 49*   BMET:   Recent Labs  09/28/14 0626 09/29/14 0739  NA 139 142  K 3.4* 3.9  CL 112* 117*  CO2 20* 19*  GLUCOSE 105* 93  BUN 65* 53*  CREATININE 4.28* 3.39*  CALCIUM 7.6* 7.7*   No results for input(s): PTH in the last 72 hours. Iron Studies: No results for input(s): IRON, TIBC, TRANSFERRIN, FERRITIN in the last 72 hours.  Studies/Results: Dg Abd Portable 1v  09/28/2014   CLINICAL DATA:  Bilateral double-J stent placement for hydronephrosis  EXAM: PORTABLE ABDOMEN - 1 VIEW  COMPARISON:  September 26, 2014 CT abdomen and pelvis and retrograde pyelogram study  FINDINGS: The left double-J stent loops proximally, likely in a rather markedly dilated collecting system based on recent studies. The tip of the left double-J stent is in the region of the bladder. The right double-J stent has its proximal aspect at L1-2 and distal aspect in the region of the bladder. There is a generalized paucity of bowel gas. No free air seen.  IMPRESSION: Double-J stent positions as described. The  proximal right stent is in the normal flank position. The left stent proximally appears to reside in a dilated renal pelvis in a kidney this somewhat inferiorly positioned. Distally, both stents are felt to be within the urinary bladder.  There is a paucity of bowel gas pattern a paucity of gas may be seen normally but also may be seen with enteritis or ileus. Bowel obstruction not felt to be likely.   Electronically Signed   By: Lowella Grip III M.D.   On: 09/28/2014 11:07    I have reviewed the patient's current medications.  Assessment/Plan: Problem #1 acute kidney injury: Obstructive. Presently his renal function today is slightly higher than yesterday. Still continued to have adequate urine output. Patient at this moment is asymptomatic. Problem #2 chronic renal failure: Stage III Problem #3 history of diabetes Problem #4 hypokalemia: His potassium has corrected. Problem #5 bilateral hydronephrosis: His status post bilateral just stent placement and is hydronephrosis. Patient had 1500 cc of urine out put and his hematuria seems to be clearing Problem #6 anemia: His hemoglobin is low but stable Problem #7 history of UTI : Patient is presently afebrile and his white blood cell count is normal. Problem #8. Low CO2 possibly metabolic. Started on sodium bicarbonate. His CO2 is declining. Plan: We'll continue with IV hydration. We'll start patient on lasix 40 mg po once a day We'll increase sodium bicarbonate to 652 tablets by mouth twice a day We'll check his basic metabolic panel in the  morning.    LOS: 4 days   Aarya Robinson S 09/30/2014,7:43 AM

## 2014-09-30 NOTE — Progress Notes (Signed)
ANTIBIOTIC CONSULT NOTE  Pharmacy Consult for Cipro Indication: Urinary Tract Infection   Allergies  Allergen Reactions  . Daypro [Oxaprozin] Nausea And Vomiting  . Xeloda [Capecitabine] Other (See Comments)    Homocidal and suicidal ideations   Patient Measurements: Height: '5\' 9"'$  (175.3 cm) Weight: 189 lb 6.4 oz (85.911 kg) IBW/kg (Calculated) : 70.7  Vital Signs: Temp: 98.7 F (37.1 C) (05/03 0603) Temp Source: Oral (05/03 0603) BP: 123/64 mmHg (05/03 0603) Pulse Rate: 66 (05/03 0603) Intake/Output from previous day: 05/02 0701 - 05/03 0700 In: 6203 [P.O.:1180; I.V.:3240] Out: 1950 [Urine:1550; Stool:400] Intake/Output from this shift:    Labs:  Recent Labs  09/28/14 0626 09/29/14 0739  WBC 5.7 6.0  HGB 8.7* 9.1*  PLT 53* 49*  CREATININE 4.28* 3.39*   Estimated Creatinine Clearance: 24.9 mL/min (by C-G formula based on Cr of 3.39). No results for input(s): VANCOTROUGH, VANCOPEAK, VANCORANDOM, GENTTROUGH, GENTPEAK, GENTRANDOM, TOBRATROUGH, TOBRAPEAK, TOBRARND, AMIKACINPEAK, AMIKACINTROU, AMIKACIN in the last 72 hours.   Microbiology: Recent Results (from the past 720 hour(s))  Urine culture     Status: None   Collection Time: 09/26/14  1:00 PM  Result Value Ref Range Status   Specimen Description URINE, RANDOM  Final   Special Requests NONE  Final   Colony Count NO GROWTH Performed at [facility]   Final   Culture NO GROWTH Performed at [facility]   Final   Report Status 09/27/2014 FINAL  Final    Anti-infectives    Start     Dose/Rate Route Frequency Ordered Stop   09/27/14 1100  ciprofloxacin (CIPRO) tablet 500 mg     500 mg Oral Daily with breakfast 09/27/14 1047       Assessment: Okay for Protocol, s/p Cystoscopy with clot evacuation; left ureteral stent removal; bilateral retrograde pyelograms; bilateral ureteral stent placement, Dr. Collene Mares, 09/26/14.  Acute kidney injury, likely from obstructive uropathy, Gross  hematuria/pyuria/bacteriuria. Tolerating PO meds/diet.  Urine cx = No Growth  Goal of Therapy:  Eradicate infection.   Plan:  Cipro '500mg'$  PO daily. Duration of therapy per MD  Hart Robinsons A 09/30/2014,7:56 AM

## 2014-10-01 DIAGNOSIS — N189 Chronic kidney disease, unspecified: Secondary | ICD-10-CM

## 2014-10-01 DIAGNOSIS — D619 Aplastic anemia, unspecified: Secondary | ICD-10-CM

## 2014-10-01 DIAGNOSIS — D631 Anemia in chronic kidney disease: Secondary | ICD-10-CM

## 2014-10-01 LAB — CBC
HCT: 26.2 % — ABNORMAL LOW (ref 39.0–52.0)
HEMOGLOBIN: 8.9 g/dL — AB (ref 13.0–17.0)
MCH: 31.8 pg (ref 26.0–34.0)
MCHC: 34 g/dL (ref 30.0–36.0)
MCV: 93.6 fL (ref 78.0–100.0)
Platelets: 31 10*3/uL — ABNORMAL LOW (ref 150–400)
RBC: 2.8 MIL/uL — ABNORMAL LOW (ref 4.22–5.81)
RDW: 15.9 % — AB (ref 11.5–15.5)
WBC: 13.6 10*3/uL — ABNORMAL HIGH (ref 4.0–10.5)

## 2014-10-01 LAB — BASIC METABOLIC PANEL
Anion gap: 7 (ref 5–15)
BUN: 42 mg/dL — AB (ref 6–20)
CO2: 16 mmol/L — ABNORMAL LOW (ref 22–32)
Calcium: 7.3 mg/dL — ABNORMAL LOW (ref 8.9–10.3)
Chloride: 118 mmol/L — ABNORMAL HIGH (ref 101–111)
Creatinine, Ser: 3.1 mg/dL — ABNORMAL HIGH (ref 0.61–1.24)
GFR calc Af Amer: 23 mL/min — ABNORMAL LOW (ref >60)
GFR calc non Af Amer: 20 mL/min — ABNORMAL LOW (ref >60)
Glucose, Bld: 98 mg/dL (ref 70–99)
POTASSIUM: 4.6 mmol/L (ref 3.5–5.1)
Sodium: 141 mmol/L (ref 135–145)

## 2014-10-01 LAB — GLUCOSE, CAPILLARY
Glucose-Capillary: 102 mg/dL — ABNORMAL HIGH (ref 70–99)
Glucose-Capillary: 104 mg/dL — ABNORMAL HIGH (ref 70–99)
Glucose-Capillary: 105 mg/dL — ABNORMAL HIGH (ref 70–99)
Glucose-Capillary: 112 mg/dL — ABNORMAL HIGH (ref 70–99)
Glucose-Capillary: 115 mg/dL — ABNORMAL HIGH (ref 70–99)
Glucose-Capillary: 99 mg/dL (ref 70–99)

## 2014-10-01 MED ORDER — OXYBUTYNIN CHLORIDE 5 MG PO TABS
5.0000 mg | ORAL_TABLET | Freq: Three times a day (TID) | ORAL | Status: DC | PRN
  Administered 2014-10-01 – 2014-10-02 (×3): 5 mg via ORAL
  Filled 2014-10-01 (×4): qty 1

## 2014-10-01 NOTE — Progress Notes (Signed)
[facility] PROGRESS NOTE  [name] KYH:[id number] DOB: 07-25-1950 DOA: 09/26/2014 PCP: [name], MD    Code Status: Full code Family Communication: discussed with patient Disposition Plan: Discharge when clinically appropriate.   Consultants:  Urology  Nephrology  Procedures:  Cystoscopy with clot evacuation; left ureteral stent removal; bilateral retrograde pyelograms; bilateral ureteral stent placement, Dr. Collene Mares, 09/26/14.  Antibiotics:  Cipro 4/30>>  HPI/Subjective: He describes discomfort in his lower abdomen. He is noted clots passing through his catheter as well as bloody urine.  Objective: Filed Vitals:   10/01/14 1424  BP: 105/49  Pulse: 79  Temp: 97.7 F (36.5 C)  Resp: 18   oxygen saturation 100%.  Intake/Output Summary (Last 24 hours) at 10/01/14 1910 Last data filed at 10/01/14 1841  Gross per 24 hour  Intake   1890 ml  Output   1420 ml  Net    470 ml   Filed Weights   09/26/14 1116 09/26/14 2017  Weight: 87.091 kg (192 lb) 85.911 kg (189 lb 6.4 oz)    Exam:   General:  Alert 64 year old Caucasian man in no acute distress.  Cardiovascular: S1, S2, no murmurs rubs or gallops.  Respiratory: Clear to auscultation bilaterally.  Abdomen: Right-sided ileostomy with bag holding greenish bilious fluid/stool; positive bowel sounds, nontender, nondistended.  GU: Indwelling Foley catheter noted; gross maroon colored bloody urine in the Foley bag.  Musculoskeletal/extremities: No acute hot red joints. No pedal edema.   Neurologic: The patient is alert and oriented 3.  Data Reviewed: Basic Metabolic Panel:  Recent Labs Lab 09/27/14 0620 09/28/14 0626 09/29/14 0735 09/29/14 0739 09/30/14 0725 10/01/14 0601  NA 139 139  --  142 141 141  K 5.6* 3.4*  --  3.9 3.9 4.6  CL 115* 112*  --  117* 117* 118*  CO2 18* 20*  --  19* 18* 16*  GLUCOSE 92 105*  --  93 94 98  BUN 74* 65*  --  53* 41* 42*  CREATININE 4.13* 4.28*   --  3.39* 2.63* 3.10*  CALCIUM 8.0* 7.6*  --  7.7* 7.6* 7.3*  PHOS  --   --  4.9*  --   --   --    Liver Function Tests:  Recent Labs Lab 09/25/14 0840 09/26/14 1230 09/27/14 0620  AST 53* 49* 50*  ALT 47 45 46  ALKPHOS 210* 179* 172*  BILITOT 2.3* 1.5* 1.5*  PROT 7.0 6.1 5.9*  ALBUMIN 2.8* 2.3* 2.2*   No results for input(s): LIPASE, AMYLASE in the last 168 hours. No results for input(s): AMMONIA in the last 168 hours. CBC:  Recent Labs Lab 09/26/14 1230 09/27/14 0620 09/28/14 0626 09/29/14 0739 09/30/14 0725 10/01/14 0601  WBC 5.9 6.4 5.7 6.0 6.8 13.6*  NEUTROABS 4.8  --   --   --   --   --   HGB 7.8* 7.7* 8.7* 9.1* 9.4* 8.9*  HCT 23.2* 23.2* 25.2* 26.5* 27.4* 26.2*  MCV 92.1 93.2 91.3 90.8 92.6 93.6  PLT 47* 47* 53* 49* 44* 31*   Cardiac Enzymes: No results for input(s): CKTOTAL, CKMB, CKMBINDEX, TROPONINI in the last 168 hours. BNP (last 3 results) No results for input(s): BNP in the last 8760 hours.  ProBNP (last 3 results) No results for input(s): PROBNP in the last 8760 hours.  CBG:  Recent Labs Lab 09/30/14 2352 10/01/14 0405 10/01/14 0757 10/01/14 1119 10/01/14 1642  GLUCAP 100* 102* 104* 112* 99    Recent Results (from  the past 240 hour(s))  Urine culture     Status: None   Collection Time: 09/26/14  1:00 PM  Result Value Ref Range Status   Specimen Description URINE, RANDOM  Final   Special Requests NONE  Final   Colony Count NO GROWTH Performed at [facility]   Final   Culture NO GROWTH Performed at [facility]   Final   Report Status 09/27/2014 FINAL  Final     Studies: No results found.  Scheduled Meds: . ciprofloxacin  500 mg Oral Q breakfast  . folic acid  1 mg Oral Daily  . furosemide  40 mg Oral Daily  . hydrocortisone cream  1 application Topical BID  . insulin aspart  0-9 Units Subcutaneous 6 times per day  . magnesium oxide  400 mg Oral Daily  . pantoprazole  40 mg Oral Daily  . sodium  bicarbonate  1,300 mg Oral BID   Continuous Infusions: . 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 The patient also presented with gross hematuria which has been subacute to chronic. CT of his abdomen and pelvis revealed severe left hydronephrosis with the left stent in place and new mild right hydronephrosis. Urologist, Dr. Collene Mares performed cystoscopy and bilateral ureteral stent placement. Nephrologist, Dr. Lowanda Foster was consulted and started the patient on IV fluids and Lasix with adjustments. Patient's creatinine is improving progressively. The patient has been transfused a total of 3 units of packed red blood cells and 2 units of platelets for persistent blood loss anemia from gross hematuria. 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 Once his creatinine improves more and stabilizes and when his hemoglobin and platelet count stabilizes, he should be ready for discharge in the next several days.  Assessment and plan: Principal Problem:   Bilateral hydronephrosis Active Problems:   DM type 2 (diabetes mellitus, type 2)   Adenocarcinoma of rectum   Pancytopenia, acquired   Thrombocytopenia   Acute blood loss anemia   Gross hematuria   Hyperkalemia   Acute kidney injury   Anemia of chronic disease   Obstructive uropathy   Recurrent UTI   1. Acute kidney injury, likely from obstructive uropathy. The patient's baseline creatinine from early March 2016 was within normal limits. Over the last couple weeks, it has progressively increased. On admission, it creatinine was 3.86. He was started on vigorous IV fluids and urologist,  Dr. Tresa Moore performed cystoscopy and bilateral ureteral stent placement. Nephrology was consulted. Dr. Lowanda Foster agreed with IV fluid hydration and started the patient on Lasix 100 mg IV twice daily. The Lasix was discontinued and then restarted at lower dosing.  Renal ultrasound on 4/30 revealed Post right-sided double-J ureteral stent placement with minimal improvement in persistent mild right-sided pelvicaliectasis;Mild to moderate residual left-sided pelvicaliectasis, improved since abdominal CT performed 09/26/2014. -his creatinine appears to be mildly worsened today. Nephrology is following. We'll continue IV fluids and irrigating catheter. Continue to follow  Hyperkalemia; hypokalemia. The patient's serum potassium was 5.6 on admission. Kayexalate was ordered by nephrology. His serum potassium fell to 3.4. Potassium chloride supplement given. Serum potassium is better.  Obstructive uropathy/hydronephrosis. The patient has a history of left hydronephrosis  and had a stent placement by urology in the past. On admission, CT of his abdomen and pelvis revealed severe left hydronephrosis and new mild right hydronephrosis. Following admission, Dr. Tresa Moore performed cystoscopy, removal of the previous left stent and placement of bilateral ureteral stents. Follow-up renal ultrasound results noted above. Staff had noted that the patient's urine output was declining and he was passing several clots in his catheter. The patient did describe discomfort around his catheter and lower abdomen. Case was discussed with Dr. Matilde Sprang urology, who recommended to flush the catheter as needed. If he continues to have low output and catheter may need to be changed out. He also recommended adding oxybutynin for bladder spasms. We'll have staff to change the catheter. If they're unable to do so then urology will need to be called.  Gross hematuria/pyuria/bacteriuria-treating as UTI. Urine culture ordered and is negative  to date Cipro was started empirically.   Acute and chronic blood loss anemia, superimposed on anemia of chronic disease. 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 His hemoglobin was 9.4 on 4/28 and 7.8 at the time of admission. He has been transfused a total of 3 units of packed red blood cells with improvement. We'll continue to monitor his CBC daily or more frequently if needed as he continues to have gross hematuria.  Acquired pancytopenia/thrombocytopenia. In part, from chemotherapy and from bleeding. His platelet count was 78 on admission and has drifted down to 47 in the setting of gross GU bleeding. His baseline platelet count appears to be 49-58. He was transfused a total of 2 units of platelets with minimal improvement.  We'll continue to monitor. If his hemoglobin continues to trend down, may need further platelet transfusion.  Type 2 diabetes mellitus. Apparently, he is treated with diet alone now. We'll continue sliding scale NovoLog, sensitive scale. His CBGs are well controlled.  Metastatic, stage IV adenocarcinoma of the rectum. The patient is followed by oncology and radiation oncology. He is being treated with XRT and single agent chemotherapy per review of the notes and per patient history. Apparently, bone marrow biopsy was recommended but the patient had refused. Oncology was notified of the patient's hospitalization.  Isolated elevated blood pressure; he has a history of hypertension. We'll add when necessary hydralazine and continue to monitor for need for oral medication.    Time spent: 60mnutes    [facility] Pager [phone] If 7PM-7AM, please contact night-coverage at [email or web address], password [facility]10/01/2014, 7:10 PM  LOS: 5 days

## 2014-10-01 NOTE — Progress Notes (Signed)
Foley catheter flushed with 30 mls NS Return bloody liquid with mult small clots

## 2014-10-01 NOTE — Progress Notes (Signed)
[name]  MRN: [id number]  DOB/AGE: [birth year] 64 y.o.  Primary Care Physician:[name], Betsy Coder, MD  Admit date: 09/26/2014  Chief Complaint:  Chief Complaint  Patient presents with  . Abnormal Lab    S-Pt presented on  09/26/2014 with  Chief Complaint  Patient presents with  . Abnormal Lab  .    Pt today feels better but his main complaints is decreased urine output  Med . ciprofloxacin  500 mg Oral Q breakfast  . folic acid  1 mg Oral Daily  . furosemide  40 mg Oral Daily  . hydrocortisone cream  1 application Topical BID  . insulin aspart  0-9 Units Subcutaneous 6 times per day  . magnesium oxide  400 mg Oral Daily  . pantoprazole  40 mg Oral Daily  . sodium bicarbonate  1,300 mg Oral BID     Physical Exam: Vital signs in last 24 hours: Temp:  [98.3 F (36.8 C)-99.5 F (37.5 C)] 98.3 F (36.8 C) (05/04 0406) Pulse Rate:  [76-125] 107 (05/04 0406) Resp:  [18-20] 18 (05/04 0406) BP: (117-186)/(52-75) 117/52 mmHg (05/04 0406) SpO2:  [99 %-100 %] 99 % (05/04 0406) Weight change:  Last BM Date: 09/30/14  Intake/Output from previous day: 05/03 0701 - 05/04 0700 In: 3387.1 [P.O.:720; I.V.:2667.1] Out: 900 [Urine:450; Stool:450] Total I/O In: 120 [P.O.:120] Out: -    Physical Exam: General- pt is awake,alert, oriented to time place and person Resp- No acute REsp distress, CTA B/L NO Rhonchi CVS- S1S2 regular in rate and rhythm GIT- BS+, soft, NT, ND EXT- NO LE Edema, NO Cyanosis   Lab Results: CBC  Recent Labs  09/30/14 0725 10/01/14 0601  WBC 6.8 13.6*  HGB 9.4* 8.9*  HCT 27.4* 26.2*  PLT 44* 31*    BMET  Recent Labs  09/30/14 0725 10/01/14 0601  NA 141 141  K 3.9 4.6  CL 117* 118*  CO2 18* 16*  GLUCOSE 94 98  BUN 41* 42*  CREATININE 2.63* 3.10*  CALCIUM 7.6* 7.3*   Creat Trend 2016   4.28=>3.39=>2.63=>3.1 2015   1.8--18.4 ( AKI) 2014  1.5--2.78 ( AKI) 2012  1.3--5.4 ( 3 episodes of AKI ) 2008 0.9--3.76 (  AKI)   MICRO Recent Results (from the past 240 hour(s))  Urine culture     Status: None   Collection Time: 09/26/14  1:00 PM  Result Value Ref Range Status   Specimen Description URINE, RANDOM  Final   Special Requests NONE  Final   Colony Count NO GROWTH Performed at [facility]   Final   Culture NO GROWTH Performed at [facility]   Final   Report Status 09/27/2014 FINAL  Final      Lab Results  Component Value Date   CALCIUM 7.3* 10/01/2014   CAION 1.23 01/14/2013   PHOS 4.9* 09/29/2014               Impression: 1)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 D/W team will start with changing foley. If changeing foley doesn't help- will do U/s to  look for worsening hydro.      [place] S 10/01/2014, 9:53 AM

## 2014-10-02 ENCOUNTER — Encounter (HOSPITAL_COMMUNITY): Admission: RE | Payer: Self-pay | Source: Ambulatory Visit

## 2014-10-02 ENCOUNTER — Ambulatory Visit (HOSPITAL_COMMUNITY): Payer: Managed Care, Other (non HMO) | Admitting: Oncology

## 2014-10-02 ENCOUNTER — Inpatient Hospital Stay (HOSPITAL_COMMUNITY): Payer: Medicare Other

## 2014-10-02 ENCOUNTER — Inpatient Hospital Stay (HOSPITAL_COMMUNITY): Payer: Managed Care, Other (non HMO)

## 2014-10-02 ENCOUNTER — Ambulatory Visit (HOSPITAL_COMMUNITY): Admission: RE | Admit: 2014-10-02 | Payer: Managed Care, Other (non HMO) | Source: Ambulatory Visit | Admitting: Urology

## 2014-10-02 LAB — CBC
HCT: 23.1 % — ABNORMAL LOW (ref 39.0–52.0)
Hemoglobin: 7.9 g/dL — ABNORMAL LOW (ref 13.0–17.0)
MCH: 32 pg (ref 26.0–34.0)
MCHC: 34.2 g/dL (ref 30.0–36.0)
MCV: 93.5 fL (ref 78.0–100.0)
PLATELETS: 18 10*3/uL — AB (ref 150–400)
RBC: 2.47 MIL/uL — ABNORMAL LOW (ref 4.22–5.81)
RDW: 16.1 % — AB (ref 11.5–15.5)
WBC: 12.1 10*3/uL — AB (ref 4.0–10.5)

## 2014-10-02 LAB — GLUCOSE, CAPILLARY
GLUCOSE-CAPILLARY: 73 mg/dL (ref 70–99)
GLUCOSE-CAPILLARY: 151 mg/dL — AB (ref 70–99)
Glucose-Capillary: 110 mg/dL — ABNORMAL HIGH (ref 70–99)
Glucose-Capillary: 111 mg/dL — ABNORMAL HIGH (ref 70–99)
Glucose-Capillary: 130 mg/dL — ABNORMAL HIGH (ref 70–99)

## 2014-10-02 LAB — BASIC METABOLIC PANEL
ANION GAP: 7 (ref 5–15)
BUN: 54 mg/dL — ABNORMAL HIGH (ref 6–20)
CALCIUM: 7.3 mg/dL — AB (ref 8.9–10.3)
CO2: 16 mmol/L — AB (ref 22–32)
Chloride: 116 mmol/L — ABNORMAL HIGH (ref 101–111)
Creatinine, Ser: 3.55 mg/dL — ABNORMAL HIGH (ref 0.61–1.24)
GFR calc non Af Amer: 17 mL/min — ABNORMAL LOW (ref >60)
GFR, EST AFRICAN AMERICAN: 20 mL/min — AB (ref >60)
Glucose, Bld: 121 mg/dL — ABNORMAL HIGH (ref 70–99)
Potassium: 4.4 mmol/L (ref 3.5–5.1)
Sodium: 139 mmol/L (ref 135–145)

## 2014-10-02 LAB — PREPARE RBC (CROSSMATCH)

## 2014-10-02 SURGERY — CYSTOURETEROSCOPY, WITH RETROGRADE PYELOGRAM AND STENT INSERTION
Anesthesia: Choice | Laterality: Left

## 2014-10-02 MED ORDER — HYOSCYAMINE SULFATE 0.125 MG SL SUBL
0.2500 mg | SUBLINGUAL_TABLET | SUBLINGUAL | Status: DC | PRN
  Administered 2014-10-02: 0.25 mg via SUBLINGUAL
  Filled 2014-10-02: qty 2

## 2014-10-02 MED ORDER — SODIUM BICARBONATE 650 MG PO TABS
1300.0000 mg | ORAL_TABLET | Freq: Three times a day (TID) | ORAL | Status: DC
  Administered 2014-10-02 – 2014-10-11 (×26): 1300 mg via ORAL
  Filled 2014-10-02 (×29): qty 2

## 2014-10-02 MED ORDER — SODIUM CHLORIDE 0.9 % IV SOLN
Freq: Once | INTRAVENOUS | Status: AC
Start: 2014-10-02 — End: 2014-10-04
  Administered 2014-10-04: 11:00:00 via INTRAVENOUS

## 2014-10-02 MED ORDER — FUROSEMIDE 10 MG/ML IJ SOLN
100.0000 mg | Freq: Two times a day (BID) | INTRAMUSCULAR | Status: DC
Start: 2014-10-02 — End: 2014-10-04
  Administered 2014-10-02 – 2014-10-04 (×5): 100 mg via INTRAVENOUS
  Filled 2014-10-02 (×9): qty 10

## 2014-10-02 MED ORDER — LIDOCAINE HCL 2 % EX GEL
1.0000 "application " | Freq: Once | CUTANEOUS | Status: DC
  Filled 2014-10-02: qty 10

## 2014-10-02 MED ORDER — MORPHINE SULFATE 2 MG/ML IJ SOLN
2.0000 mg | INTRAMUSCULAR | Status: DC | PRN
  Administered 2014-10-02 – 2014-10-11 (×25): 2 mg via INTRAVENOUS
  Filled 2014-10-02 (×26): qty 1

## 2014-10-02 NOTE — Progress Notes (Signed)
24 french coude catheter inserted using sterile technique by E.Anastashia Westerfeld, RN and L.Schonewitz, Therapist, sports. Pt tolerated well, 500cc of bloody urine returned.

## 2014-10-02 NOTE — Progress Notes (Signed)
[facility] PROGRESS NOTE  [name] KGM:[id number] DOB: May 17, 1951 DOA: 09/26/2014 PCP: [name], MD    Code Status: Full code Family Communication: discussed with patient Disposition Plan: Discharge when clinically appropriate.   Consultants:  Urology  Nephrology  Procedures:  Cystoscopy with clot evacuation; left ureteral stent removal; bilateral retrograde pyelograms; bilateral ureteral stent placement, Dr. Collene Mares, 09/26/14.  Antibiotics:  Cipro 4/30>>  HPI/Subjective: He describes discomfort in his lower abdomen. He is noted clots passing through his catheter as well as bloody urine.  Objective: Filed Vitals:   10/02/14 1438  BP: 95/60  Pulse: 120  Temp: 97.6 F (36.4 C)  Resp: 18   oxygen saturation 100%.  Intake/Output Summary (Last 24 hours) at 10/02/14 1446 Last data filed at 10/02/14 1210  Gross per 24 hour  Intake   2540 ml  Output    850 ml  Net   1690 ml   Filed Weights   09/26/14 1116 09/26/14 2017 10/02/14 0420  Weight: 87.091 kg (192 lb) 85.911 kg (189 lb 6.4 oz) 95.119 kg (209 lb 11.2 oz)    Exam:   General:  Alert 64 year old Caucasian man in no acute distress.  Cardiovascular: S1, S2, no murmurs rubs or gallops.  Respiratory: Clear to auscultation bilaterally.  Abdomen: Right-sided ileostomy with bag holding greenish bilious fluid/stool; positive bowel sounds, nontender, nondistended.  GU: Indwelling Foley catheter noted; gross maroon colored bloody urine in the Foley bag.  Musculoskeletal/extremities: No acute hot red joints. No pedal edema.   Neurologic: The patient is alert and oriented 3.  Data Reviewed: Basic Metabolic Panel:  Recent Labs Lab 09/28/14 0626 09/29/14 0735 09/29/14 0739 09/30/14 0725 10/01/14 0601 10/02/14 0726  NA 139  --  142 141 141 139  K 3.4*  --  3.9 3.9 4.6 4.4  CL 112*  --  117* 117* 118* 116*  CO2 20*  --  19* 18* 16* 16*  GLUCOSE 105*  --  93 94 98 121*  BUN 65*  --   53* 41* 42* 54*  CREATININE 4.28*  --  3.39* 2.63* 3.10* 3.55*  CALCIUM 7.6*  --  7.7* 7.6* 7.3* 7.3*  PHOS  --  4.9*  --   --   --   --    Liver Function Tests:  Recent Labs Lab 09/26/14 1230 09/27/14 0620  AST 49* 50*  ALT 45 46  ALKPHOS 179* 172*  BILITOT 1.5* 1.5*  PROT 6.1 5.9*  ALBUMIN 2.3* 2.2*   No results for input(s): LIPASE, AMYLASE in the last 168 hours. No results for input(s): AMMONIA in the last 168 hours. CBC:  Recent Labs Lab 09/26/14 1230  09/28/14 0626 09/29/14 0739 09/30/14 0725 10/01/14 0601 10/02/14 0726  WBC 5.9  < > 5.7 6.0 6.8 13.6* 12.1*  NEUTROABS 4.8  --   --   --   --   --   --   HGB 7.8*  < > 8.7* 9.1* 9.4* 8.9* 7.9*  HCT 23.2*  < > 25.2* 26.5* 27.4* 26.2* 23.1*  MCV 92.1  < > 91.3 90.8 92.6 93.6 93.5  PLT 47*  < > 53* 49* 44* 31* 18*  < > = values in this interval not displayed. Cardiac Enzymes: No results for input(s): CKTOTAL, CKMB, CKMBINDEX, TROPONINI in the last 168 hours. BNP (last 3 results) No results for input(s): BNP in the last 8760 hours.  ProBNP (last 3 results) No results for input(s): PROBNP in the last 8760 hours.  CBG:  Recent Labs Lab 10/01/14 2009 10/01/14 2359 10/02/14 0415 10/02/14 0814 10/02/14 1142  GLUCAP 115* 105* 73 110* 111*    Recent Results (from the past 240 hour(s))  Urine culture     Status: None   Collection Time: 09/26/14  1:00 PM  Result Value Ref Range Status   Specimen Description URINE, RANDOM  Final   Special Requests NONE  Final   Colony Count NO GROWTH Performed at [facility]   Final   Culture NO GROWTH Performed at [facility]   Final   Report Status 09/27/2014 FINAL  Final     Studies: US Renal  10/02/2014   CLINICAL DATA:  Acute on chronic renal failure, hematuria, nephrolithiasis, bilateral ureteral stents  EXAM: RENAL / URINARY TRACT ULTRASOUND COMPLETE  COMPARISON:  CT abdomen pelvis dated 09/26/2014  FINDINGS: Right Kidney:  Length: 15.0 cm.  Moderate hydronephrosis. Indwelling ureteral stent.  Left Kidney:  Length: 13.3 cm. Moderate hydronephrosis. Ureteral stent not visualized.  Bladder:  Moderately distended despite indwelling Foley catheter. Suspected layering debris/ hemorrhage within the bladder (image 40).  IMPRESSION: Moderate bilateral hydronephrosis.  Indwelling right ureteral stent. Left ureteral stent not visualized. When correlating with prior CT, this is likely related to low position.  Bladder is moderately distended despite indwelling Foley catheter. Suspected layering debris/hemorrhage within the bladder. Consider bladder irrigation and/or cystoscopy.   Electronically Signed   By: Julian Hy M.D.   On: 10/02/2014 11:56    Scheduled Meds: . sodium chloride   Intravenous Once  . ciprofloxacin  500 mg Oral Q breakfast  . folic acid  1 mg Oral Daily  . furosemide  100 mg Intravenous BID  . hydrocortisone cream  1 application Topical BID  . insulin aspart  0-9 Units Subcutaneous 6 times per day  . magnesium oxide  400 mg Oral Daily  . pantoprazole  40 mg Oral Daily  . sodium bicarbonate  1,300 mg Oral TID   Continuous Infusions: . 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 The patient also presented with gross hematuria which has been subacute to chronic. CT of his abdomen and pelvis revealed severe left hydronephrosis with the left stent in place and new mild right hydronephrosis. Urologist, Dr. Collene Mares performed cystoscopy and bilateral ureteral stent placement. Nephrologist, Dr. Lowanda Foster was consulted and started the patient on IV fluids and Lasix with adjustments. Patient's creatinine is improving progressively. The patient has  been transfused a total of 3 units of packed red blood cells and 2 units of platelets for persistent blood loss anemia from gross hematuria. 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 Once his creatinine improves more and stabilizes and when his hemoglobin and platelet count stabilizes, he should be ready for discharge in the next several days.  Assessment and plan: Principal Problem:   Bilateral hydronephrosis Active Problems:   DM type 2 (diabetes mellitus, type 2)   Adenocarcinoma of rectum   Pancytopenia, acquired   Thrombocytopenia   Acute blood loss anemia   Gross hematuria   Hyperkalemia   Acute kidney injury   Anemia of chronic disease   Obstructive uropathy   Recurrent UTI   1. Acute  kidney injury, likely from obstructive uropathy. The patient's baseline creatinine from early March 2016 was within normal limits. Over the last couple weeks, it has progressively increased. On admission, his creatinine was 3.86. He was started on vigorous IV fluids and urologist, Dr. Tresa Moore performed cystoscopy and bilateral ureteral stent placement. Nephrology was consulted. Dr. Lowanda Foster agreed with IV fluid hydration and started the patient on Lasix 100 mg IV twice daily. The Lasix was discontinued and then restarted at lower dosing.  Renal ultrasound on 4/30 revealed Post right-sided double-J ureteral stent placement with minimal improvement in persistent mild right-sided pelvicaliectasis;Mild to moderate residual left-sided pelvicaliectasis, improved since abdominal CT performed 09/26/2014. -his creatinine appears to be worse today. Nephrology is following. We'll continue IV fluids and irrigating catheter. Foley catheter was changed out twice today. He has also been re started on high-dose intravenous Lasix. Continue to monitor urine output  Hyperkalemia; hypokalemia. The patient's serum potassium was 5.6 on  admission. Kayexalate was ordered by nephrology. His serum potassium fell to 3.4. Potassium chloride supplement given. Serum potassium is better.  Obstructive uropathy/hydronephrosis. The patient has a history of left hydronephrosis and had a stent placement by urology in the past. On admission, CT of his abdomen and pelvis revealed severe left hydronephrosis and new mild right hydronephrosis. Following admission, Dr. Tresa Moore performed cystoscopy, removal of the previous left stent and placement of bilateral ureteral stents. He has had follow-up renal ultrasounds which indicate persistent bilateral hydronephrosis and enlarged bladder. He is having frequent issues with clotting of his Foley catheter. His catheter has required to be changed twice today, the last time requiring coude catheter. He'll likely need urology input to see if CBI is needed. We'll continue to flush catheter every 3-4 hours.  Gross hematuria/pyuria/bacteriuria-treating as UTI. Urine culture ordered and is negative to date Cipro was started empirically. Likely discontinue antibiotics tomorrow since it is the seventh day of treatment.  Acute and chronic blood loss anemia, superimposed on anemia of chronic disease. 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 His hemoglobin was 9.4 on 4/28 and 7.8 at the time of admission. He has been transfused a total of 3 units of packed red blood cells with improvement. Unfortunately, his hemoglobin continues to trend down with ongoing hematuria. Will transfuse another unit of PRBC today. We'll continue to monitor his CBC daily or more frequently if needed as he continues to have gross hematuria.  Acquired pancytopenia/thrombocytopenia. Appears to be a chronic finding, in part, from chemotherapy/radiation/chronic disease and from bleeding. His platelet count was 78 on admission and has drifted down to 18 in  the setting of gross GU bleeding. His baseline platelet count appears to be 49-58. He was transfused a total of 2 units of platelets with minimal improvement.  Since he continues to have significant hematuria, he'll be transfused 1 unit of PRBC as well as 1 unit of platelets today. Continue to follow.  Type 2 diabetes mellitus. Apparently, he is treated with diet alone now. We'll continue sliding scale NovoLog, sensitive scale. His CBGs are well controlled.  Metastatic, stage IV adenocarcinoma of the rectum. The patient is followed by oncology and radiation oncology. He is being treated with XRT and single agent chemotherapy per review of the notes and per patient history. Apparently, bone marrow biopsy was recommended but the patient had refused. Oncology was notified of the patient's hospitalization.  Isolated elevated blood pressure; he has a history of hypertension. On  when necessary hydralazine and continue to monitor for need for oral medication.    Time spent: 25mnutes    MEMON,JEHANZEB  [facility] Pager [phone] If 7PM-7AM, please contact night-coverage at [email or web address], password [email or web address]5/09/2014, 2:46 PM  LOS: 6 days

## 2014-10-02 NOTE — Progress Notes (Signed)
Nurse has irrigated catheter four times throughout night with small clots returned. Pt is having discomfort at this time. Irrigated Foley Catheter. After this last irrigation, there was approximately 40 mL returned. Nurse wasn't able to remove clot. Charge Nurse attempted also.Paged on call urologist. Then paged Dr Darrick Meigs, who ordered to remove catheter and place a new 24 Fr catheter.

## 2014-10-02 NOTE — Progress Notes (Signed)
Subjective: Interval History: Patient offers no new complaints today. He told me his catheter was changed and there was a blood clot removed. He denies any difficulty in breathing and his appetite is good.  Objective: Vital signs in last 24 hours: Temp:  [97.7 F (36.5 C)-98.8 F (37.1 C)] 98.8 F (37.1 C) (05/05 0420) Pulse Rate:  [72-81] 81 (05/05 0420) Resp:  [18] 18 (05/05 0420) BP: (95-123)/(49-61) 123/61 mmHg (05/05 0420) SpO2:  [100 %] 100 % (05/05 0420) Weight:  [95.119 kg (209 lb 11.2 oz)] 95.119 kg (209 lb 11.2 oz) (05/05 0420) Weight change:   Intake/Output from previous day: 05/04 0701 - 05/05 0700 In: 2540 [P.O.:840; I.V.:1620] Out: 1670 [Urine:1170; Stool:500] Intake/Output this shift:    Generally patient is alert and in no apparent distress. Chest is clear to auscultation, no rales rhonchi or egophony. His heart exam revealed regular rate and rhythm no murmur no S3 Abdomen: Soft nontender Extremities trace  edema  Lab Results:  Recent Labs  10/01/14 0601 10/02/14 0726  WBC 13.6* 12.1*  HGB 8.9* 7.9*  HCT 26.2* 23.1*  PLT 31* 18*   BMET:   Recent Labs  10/01/14 0601 10/02/14 0726  NA 141 139  K 4.6 4.4  CL 118* 116*  CO2 16* 16*  GLUCOSE 98 121*  BUN 42* 54*  CREATININE 3.10* 3.55*  CALCIUM 7.3* 7.3*   No results for input(s): PTH in the last 72 hours. Iron Studies: No results for input(s): IRON, TIBC, TRANSFERRIN, FERRITIN in the last 72 hours.  Studies/Results: No results found.  I have reviewed the patient's current medications.  Assessment/Plan: Problem #1 acute kidney injury: Obstructive. His BUN and creatinine continued to increase after some improvement. His urine output also has declined. His catheter was changed and he had a blood clot which was removed. Hence at this moment patient might have possibly worsening of his obstructive uropathy. Problem #2 chronic renal failure: Stage III Problem #3 history of diabetes Problem #4  hypokalemia: His potassium is normal. Problem #5 bilateral hydronephrosis: His status post bilateral just stent placement and is hydronephrosis. Patient had 500 cc of urine out put and his hematuria. Problem #6 anemia: His hemoglobin is low but stable Problem #7 history of UTI : Patient is presently afebrile and his white blood cell count is high Problem #8. Low CO2 possibly metabolic. Started on sodium bicarbonate. His CO2 is remains low but stble Plan: We'll continue with IV hydration. We'll start patient on lasix 100 mg iv bid We'll increase sodium bicarbonate to 652 tablets by mouth 3 times a day We will do US of the kidneys to r/o obstruction We'll check his basic metabolic panel in the morning.    LOS: 6 days   [name] S 10/02/2014,9:49 AM

## 2014-10-03 DIAGNOSIS — N319 Neuromuscular dysfunction of bladder, unspecified: Secondary | ICD-10-CM | POA: Diagnosis not present

## 2014-10-03 DIAGNOSIS — E875 Hyperkalemia: Secondary | ICD-10-CM

## 2014-10-03 DIAGNOSIS — N179 Acute kidney failure, unspecified: Secondary | ICD-10-CM

## 2014-10-03 DIAGNOSIS — R944 Abnormal results of kidney function studies: Secondary | ICD-10-CM

## 2014-10-03 DIAGNOSIS — R338 Other retention of urine: Secondary | ICD-10-CM | POA: Diagnosis not present

## 2014-10-03 DIAGNOSIS — N1330 Unspecified hydronephrosis: Secondary | ICD-10-CM

## 2014-10-03 DIAGNOSIS — N390 Urinary tract infection, site not specified: Secondary | ICD-10-CM

## 2014-10-03 LAB — GLUCOSE, CAPILLARY
GLUCOSE-CAPILLARY: 129 mg/dL — AB (ref 70–99)
GLUCOSE-CAPILLARY: 139 mg/dL — AB (ref 70–99)
Glucose-Capillary: 112 mg/dL — ABNORMAL HIGH (ref 70–99)
Glucose-Capillary: 114 mg/dL — ABNORMAL HIGH (ref 70–99)
Glucose-Capillary: 118 mg/dL — ABNORMAL HIGH (ref 70–99)
Glucose-Capillary: 119 mg/dL — ABNORMAL HIGH (ref 70–99)
Glucose-Capillary: 123 mg/dL — ABNORMAL HIGH (ref 70–99)

## 2014-10-03 LAB — PREPARE PLATELET PHERESIS: Unit division: 0

## 2014-10-03 LAB — BASIC METABOLIC PANEL
Anion gap: 7 (ref 5–15)
BUN: 65 mg/dL — AB (ref 6–20)
CALCIUM: 6.6 mg/dL — AB (ref 8.9–10.3)
CO2: 15 mmol/L — ABNORMAL LOW (ref 22–32)
Chloride: 116 mmol/L — ABNORMAL HIGH (ref 101–111)
Creatinine, Ser: 4.8 mg/dL — ABNORMAL HIGH (ref 0.61–1.24)
GFR calc Af Amer: 14 mL/min — ABNORMAL LOW (ref >60)
GFR calc non Af Amer: 12 mL/min — ABNORMAL LOW (ref >60)
Glucose, Bld: 122 mg/dL — ABNORMAL HIGH (ref 70–99)
Potassium: 5.5 mmol/L — ABNORMAL HIGH (ref 3.5–5.1)
SODIUM: 138 mmol/L (ref 135–145)

## 2014-10-03 LAB — CBC
HEMATOCRIT: 16 % — AB (ref 39.0–52.0)
Hemoglobin: 5.4 g/dL — CL (ref 13.0–17.0)
MCH: 30.5 pg (ref 26.0–34.0)
MCHC: 33.8 g/dL (ref 30.0–36.0)
MCV: 90.4 fL (ref 78.0–100.0)
Platelets: 40 10*3/uL — ABNORMAL LOW (ref 150–400)
RBC: 1.77 MIL/uL — ABNORMAL LOW (ref 4.22–5.81)
RDW: 16.6 % — AB (ref 11.5–15.5)
WBC: 15.4 10*3/uL — AB (ref 4.0–10.5)

## 2014-10-03 LAB — PREPARE RBC (CROSSMATCH)

## 2014-10-03 MED ORDER — SODIUM POLYSTYRENE SULFONATE 15 GM/60ML PO SUSP
30.0000 g | Freq: Once | ORAL | Status: AC
Start: 2014-10-03 — End: 2014-10-03
  Administered 2014-10-03: 30 g via ORAL
  Filled 2014-10-03: qty 120

## 2014-10-03 MED ORDER — SODIUM CHLORIDE 0.9 % IV SOLN
Freq: Once | INTRAVENOUS | Status: AC
  Administered 2014-10-03: 11:00:00 via INTRAVENOUS

## 2014-10-03 MED ORDER — ENSURE ENLIVE PO LIQD
237.0000 mL | Freq: Two times a day (BID) | ORAL | Status: DC
Start: 2014-10-03 — End: 2014-10-11
  Administered 2014-10-03 – 2014-10-11 (×12): 237 mL via ORAL

## 2014-10-03 MED ORDER — PRO-STAT SUGAR FREE PO LIQD
30.0000 mL | Freq: Two times a day (BID) | ORAL | Status: DC
  Administered 2014-10-04 – 2014-10-11 (×10): 30 mL via ORAL
  Filled 2014-10-03 (×15): qty 30

## 2014-10-03 MED ORDER — SODIUM CHLORIDE 0.9 % IV SOLN
Freq: Once | INTRAVENOUS | Status: AC
  Administered 2014-10-03: 14:00:00 via INTRAVENOUS

## 2014-10-03 NOTE — Care Management Utilization Note (Signed)
UR completed 

## 2014-10-03 NOTE — Progress Notes (Signed)
INITIAL NUTRITION ASSESSMENT  DOCUMENTATION CODES Per approved criteria  -Obesity Unspecified   INTERVENTION: Ensure Enlive po BID, each supplement provides 350 kcal and 20 grams of protein   ProStat 30 ml BID (each 30 ml provides 100 kcal, 15 gr protein)   Limit high potassium foods  NUTRITION DIAGNOSIS: Inadequate oral intake related to poor appetite as evidenced by pt hx and meal intake records  0-50% day 7.   Goal: Pt to meet >/= 90% of their estimated nutrition needs    Monitor: Po intake, labs and wt trends   Reason for Assessment: evaluate nutrition status  64 y.o. male   ASSESSMENT: Hx of rectal bleeding and rectal cancer and lung metastsis. Pt has an ileostomy to RLQ (from July 2012). He had a critical low hgb this morning (5.4). He is scheduled for 3 more units of blood. He is s/p cystoscopy with clot evacuation and bilateral ureteral stent placement.  Diet: CHO Modified and pt is feeding himself. He is eating 0-50% of meals since admission (day 7). At home he follows a regular diet and limits certain foods based on  hx of transit time.  Weight: hx is stable currently. Actual weight change may be masked by intake of normal saline. Strongly suspect that pt has malnutrition (given his medical hx and poor po intake) but unable to diagnosis within criteria limits at this time.  Physical exam:  WDL  Height: Ht Readings from Last 1 Encounters:  09/26/14 '5\' 9"'$  (1.753 m)    Weight: Wt Readings from Last 1 Encounters:  10/02/14 209 lb 11.2 oz (95.119 kg)    Ideal Body Weight: 160# (72.7 kg)  % Ideal Body Weight: 131%  Wt Readings from Last 10 Encounters:  10/02/14 209 lb 11.2 oz (95.119 kg)  09/23/14 203 lb 9.6 oz (92.352 kg)  09/15/14 203 lb 9.6 oz (92.352 kg)  09/08/14 205 lb (92.987 kg)  09/01/14 203 lb 12.8 oz (92.443 kg)  08/25/14 201 lb 6.4 oz (91.354 kg)  08/18/14 200 lb 9.6 oz (90.992 kg)  08/11/14 199 lb 3.2 oz (90.357 kg)  08/05/14 205 lb (92.987  kg)  08/04/14 205 lb (92.987 kg)    Usual Body Weight: 200-210#  % Usual Body Weight: 100%  BMI:  Body mass index is 30.95 kg/(m^2).  Obesity class I  Estimated Nutritional Needs: Kcal: 1900-2200  Protein: 110-123 gr Fluid: 1.9-2.2 liters daily  Skin: yellowish appearance  Diet Order: Diet Carb Modified Fluid consistency:: Thin; Room service appropriate?: Yes  EDUCATION NEEDS: -No education needs identified at this time   Intake/Output Summary (Last 24 hours) at 10/03/14 1130 Last data filed at 10/03/14 0646  Gross per 24 hour  Intake 5041.5 ml  Output    950 ml  Net 4091.5 ml    Last BM: 10/03/14 - (200 ml)  Labs:   Recent Labs Lab 09/29/14 0735  10/01/14 0601 10/02/14 0726 10/03/14 0910  NA  --   < > 141 139 138  K  --   < > 4.6 4.4 5.5*  CL  --   < > 118* 116* 116*  CO2  --   < > 16* 16* 15*  BUN  --   < > 42* 54* 65*  CREATININE  --   < > 3.10* 3.55* 4.80*  CALCIUM  --   < > 7.3* 7.3* 6.6*  PHOS 4.9*  --   --   --   --   GLUCOSE  --   < >  98 121* 122*  < > = values in this interval not displayed.  CBG (last 3)   Recent Labs  10/03/14 0006 10/03/14 0356 10/03/14 0800  GLUCAP 139* 119* 118*    Scheduled Meds: . sodium chloride   Intravenous Once  . sodium chloride   Intravenous Once  . ciprofloxacin  500 mg Oral Q breakfast  . folic acid  1 mg Oral Daily  . furosemide  100 mg Intravenous BID  . hydrocortisone cream  1 application Topical BID  . insulin aspart  0-9 Units Subcutaneous 6 times per day  . lidocaine  1 application Urethral Once  . magnesium oxide  400 mg Oral Daily  . pantoprazole  40 mg Oral Daily  . sodium bicarbonate  1,300 mg Oral TID    Continuous Infusions: . sodium chloride 135 mL/hr at 10/03/14 5102    Past Medical History  Diagnosis Date  . Pancytopenia, acquired 12/04/2010  . B12 deficiency 12/07/2010  . Kidney stones   . CAD (coronary artery disease)   . Anemia   . History of radiation therapy 06/19/07 -07/27/07     whole pelvis  . Hx of radiation therapy 08/06/12,08/09/12,& 08/13/12    rul 54GY/39f  . Cancer     Colon ca dx 2008 surg/rad/chemo  . Lung cancer   . FH: chemotherapy   . Rectal bleeding 09/20/2013    From recurrent rectal mass/recurrent rectal adenocarcinoma  . Acute blood loss anemia 09/27/2013  . 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  2.7 cm primary, moderately differentiated with 3/8 positive lymph nodes without LVI.  Surgery was on 04/22/2007 and was treated postoperatively with radiation and concomitant capecitabine and then 4 cycles of a planned 6 with oxaliplatin and capecitabine.  His counts would not allow treatment of  . Hydronephrosis of left kidney 09/24/2013    Kidney stones; s/p cystoscopy and stent  . Gross hematuria 09/25/2103  . Lung metastases 07/09/2013  . Secondary malignancy of right lung 07/09/2013  . Insomnia 06/09/2014  . Foley catheter in place 07/02/14  . Broken collarbone feb 2016  . Myocardial infarction     2003   . Heart murmur   . Sleep apnea     hx of cpap no longer wears   . Diabetes mellitus without complication     hx of no longer on meds   . Hypertension     hx of   . Shortness of breath dyspnea     due to radiaiton treatment   . GERD (gastroesophageal reflux disease)   . Bilateral hydronephrosis 09/27/2014    Past Surgical History  Procedure Laterality Date  . Ileostomy  12/07/2010    Procedure: ILEOSTOMY;  Surgeon: BDonato Heinz  Location: [facility];  Service: General;  Laterality: N/A;  Diverting Ileostomy Lysis of adhesions Exploratory Laparotomy  . Lung biopsy  10/27/10    rt lobe- adenocarcinoma  . Cholecystectomy    . Cataract extraction w/ intraocular lens implant  2007    bil  . Lithotripsy  2002  . Coronary angioplasty with stent placement  2003    stenting x 2  . Colon resection  2008    x2  . Esophagogastroduodenoscopy N/A  05/02/2013    Dr. RGala Romney polypoid antral fold, bx= reactive gastropathy  . Givens capsule study N/A 05/02/2013    Procedure: GIVENS CAPSULE STUDY;  Surgeon: RCristopher Estimable  Rourk, MD;  Location: [facility];  Service: Endoscopy;  Laterality: N/A;  . Colonoscopy N/A 09/20/2013    SLF:A near circumferential fungating mass with friable surfaces was found in the rectum.  ACTIVELY OOZING.  CLOTS SEEN IN LUMEN AND ASPIRATED.  Multiple biopsies were performed using cold forceps.  Thermal therapy(BICAP 7 Fr 25W) was used to ATTEMPT TO control bleeding.    HEMOSTASIS NOT ACHEIVED.  Marland Kitchen Cystoscopy w/ ureteral stent placement Left 09/24/2013    Procedure: CYSTOSCOPY WITH LEFT RETROGRADE PYELOGRAM; LEFT URETERAL STENT PLACEMENT; REMOVAL OF SMALL BLADDER CALCULI;  Surgeon: Marissa Nestle, MD;  Location: [facility];  Service: Urology;  Laterality: Left;  . Flexible sigmoidoscopy N/A 09/25/2013    QPR:[id number] exophytic rectal mass most likely representing recurrent adenocarcinoma status post biopsy  . Cystoscopy with retrograde pyelogram, ureteroscopy and stent placement Left 05/08/2014    Procedure: CYSTOSCOPY/ EVACUATION OF CLOTS  WITH LEFT RETROGRADE PYELOGRAM,LEFT URETEROSCOPY ;  Surgeon: Malka So, MD;  Location: [facility];  Service: Urology;  Laterality: Left;  . Nephrolithotomy Left 05/13/2014    Procedure: LEFT NEPHROLITHOTOMY PERCUTANEOUS FIRST STAGE;  Surgeon: Malka So, MD;  Location: [facility];  Service: Urology;  Laterality: Left;  . Holmium laser application Left 99/35/7017    Procedure: HOLMIUM LASER APPLICATION;  Surgeon: Malka So, MD;  Location: [facility];  Service: Urology;  Laterality: Left;  . Cystoscopy w/ ureteral stent placement Bilateral 09/26/2014    Procedure: CYSTOSCOPY WITH CLOT EVACUATION; LEFT URETERAL STENT REMOVAL; BILATERAL RETROGRADE PYELOGRAM; BILATERAL URETERAL STENT PLACEMENT;  Surgeon: Alexis Frock, MD;  Location: [facility];  Service: Urology;  Laterality: Bilateral;    Colman Cater  MS,RD,CSG,LDN Office: [phone] Pager: [phone]

## 2014-10-03 NOTE — Progress Notes (Signed)
This morning foley catheter was irrigated with 30cc saline with return of 150cc of blood and blood clots. Earlier attempts were unsuccessful. Patient had a total of 150cc of bloody urine output during shift. Bladder scan performed and showed less than 11cc. Patient continues to report lower abdominal discomfort. Pain medication given. Patient is resting comfortably. Will continue to monitor.

## 2014-10-03 NOTE — Progress Notes (Signed)
Patient ID: [name], male   DOB: June 09, 1950, 64 y.o.   MRN: [id number] 7 Days Post-Op  Subjective: I was asked to see Jeremia again today for a rising creatinine with persistent hematuria and clot retention.  He had his stents and foley replaced last week and his cr was falling, but over the last 2-3 days his Cr has begun to rise again and is now up to 4.8.   He had a renal US yesterday that shows moderate bilateral hydro with a large amount of clot in the bladder.   He has a 22f foley but it is not draining despite irrigations.   He remains progressively anemic with a hgb of 5.4 and his platelet count remains low at 40K.   ROS:  Review of Systems  Constitutional: Negative for fever and chills.  Gastrointestinal: Positive for abdominal pain (suprapubic that is mild).  Genitourinary: Positive for hematuria.    Anti-infectives: Anti-infectives    Start     Dose/Rate Route Frequency Ordered Stop   09/27/14 1100  ciprofloxacin (CIPRO) tablet 500 mg  Status:  Discontinued     500 mg Oral Daily with breakfast 09/27/14 1047 10/03/14 1235      Current Facility-Administered Medications  Medication Dose Route Frequency Provider Last Rate Last Dose  . 0.9 %  sodium chloride infusion   Intravenous Continuous BFran Lowes MD 135 mL/hr at 10/03/14 0911    . 0.9 %  sodium chloride infusion   Intravenous Once JKathie Dike MD      . folic acid (FOLVITE) tablet 1 mg  1 mg Oral Daily Nimish CLuther Parody MD   1 mg at 10/03/14 0911  . furosemide (LASIX) 100 mg in dextrose 5 % 50 mL IVPB  100 mg Intravenous BID BFran Lowes MD   100 mg at 10/03/14 0911  . hydrALAZINE (APRESOLINE) injection 5 mg  5 mg Intravenous Q4H PRN DRexene Alberts MD      . HYDROcodone-acetaminophen (NORCO) 7.5-325 MG per tablet 1 tablet  1 tablet Oral Q6H PRN NDoree Albee MD   1 tablet at 10/02/14 1320  . hydrocortisone cream 1 % 1 application  1 application Topical BID NDoree Albee MD   1 application at  [id number]/[id number]  . hyoscyamine (LEVSIN SL) SL tablet 0.25 mg  0.25 mg Sublingual Q4H PRN JKathie Dike MD   0.25 mg at 10/02/14 1046  . insulin aspart (novoLOG) injection 0-9 Units  0-9 Units Subcutaneous 6 times per day NDoree Albee MD   1 Units at 10/03/14 0021  . lidocaine (XYLOCAINE) 2 % jelly 1 application  1 application Urethral Once JKathie Dike MD      . magnesium oxide (MAG-OX) tablet 400 mg  400 mg Oral Daily Nimish CLuther Parody MD   400 mg at 10/03/14 0911  . morphine 2 MG/ML injection 2 mg  2 mg Intravenous Q2H PRN JKathie Dike MD   2 mg at 10/03/14 1308  . ondansetron (ZOFRAN) tablet 4 mg  4 mg Oral Q6H PRN Nimish C Gosrani, MD       Or  . ondansetron (ZOFRAN) injection 4 mg  4 mg Intravenous Q6H PRN NDoree Albee MD   4 mg at 09/26/14 1902  . oxybutynin (DITROPAN) tablet 5 mg  5 mg Oral Q8H PRN JKathie Dike MD   5 mg at 10/02/14 1739  . pantoprazole (PROTONIX) EC tablet 40 mg  40 mg Oral Daily Nimish CLuther Parody MD   40 mg at  10/03/14 0910  . sodium bicarbonate tablet 1,300 mg  1,300 mg Oral TID Fran Lowes, MD   1,300 mg at 10/03/14 0910  . temazepam (RESTORIL) capsule 15-30 mg  15-30 mg Oral QHS PRN Doree Albee, MD   15 mg at 09/29/14 2240   Facility-Administered Medications Ordered in Other Encounters  Medication Dose Route Frequency Provider Last Rate Last Dose  . heparin lock flush 100 unit/mL  500 Units Intravenous Once Everardo All, MD      . sodium chloride 0.9 % injection 10 mL  10 mL Intravenous PRN Everardo All, MD   10 mL at 07/17/12 1113     Objective: Vital signs in last 24 hours: Temp:  [97.4 F (36.3 C)-98.7 F (37.1 C)] 98.7 F (37.1 C) (05/06 1156) Pulse Rate:  [81-120] 83 (05/06 1156) Resp:  [17-18] 18 (05/06 1156) BP: (82-111)/(41-60) 111/44 mmHg (05/06 1156) SpO2:  [100 %] 100 % (05/06 1156)  Intake/Output from previous day: 05/05 0701 - 05/06 0700 In: 5161.5 [P.O.:600; I.V.:3458.5; Blood:983; IV Piggyback:120] Out:  1300 [Urine:850; Stool:450] Intake/Output this shift: Total I/O In: 500 [I.V.:500] Out: -    Physical Exam  Constitutional: He is oriented to person, place, and time and well-developed, well-nourished, and in no distress.  Abdominal: Soft. There is tenderness (suprapubic ).  Genitourinary:  He has penile edema and a 24 fr foley  Neurological: He is alert and oriented to person, place, and time.  Skin: Skin is warm and dry. There is pallor.  Vitals reviewed.   Lab Results:   Recent Labs  10/02/14 0726 10/03/14 1012  WBC 12.1* 15.4*  HGB 7.9* 5.4*  HCT 23.1* 16.0*  PLT 18* 40*   BMET  Recent Labs  10/02/14 0726 10/03/14 0910  NA 139 138  K 4.4 5.5*  CL 116* 116*  CO2 16* 15*  GLUCOSE 121* 122*  BUN 54* 65*  CREATININE 3.55* 4.80*  CALCIUM 7.3* 6.6*   PT/INR No results for input(s): LABPROT, INR in the last 72 hours. ABG No results for input(s): PHART, HCO3 in the last 72 hours.  Invalid input(s): PCO2, PO2  Studies/Results: US Renal  10/02/2014   CLINICAL DATA:  Acute on chronic renal failure, hematuria, nephrolithiasis, bilateral ureteral stents  EXAM: RENAL / URINARY TRACT ULTRASOUND COMPLETE  COMPARISON:  CT abdomen pelvis dated 09/26/2014  FINDINGS: Right Kidney:  Length: 15.0 cm. Moderate hydronephrosis. Indwelling ureteral stent.  Left Kidney:  Length: 13.3 cm. Moderate hydronephrosis. Ureteral stent not visualized.  Bladder:  Moderately distended despite indwelling Foley catheter. Suspected layering debris/ hemorrhage within the bladder (image 40).  IMPRESSION: Moderate bilateral hydronephrosis.  Indwelling right ureteral stent. Left ureteral stent not visualized. When correlating with prior CT, this is likely related to low position.  Bladder is moderately distended despite indwelling Foley catheter. Suspected layering debris/hemorrhage within the bladder. Consider bladder irrigation and/or cystoscopy.   Electronically Signed   By: Julian Hy M.D.   On:  10/02/2014 11:56    After reviewing the [place] and labs I elected to irrigate the foley.  I used a catheter tip syringe and saline and evacuated about 155m of clots and about 3-4059mof bloody urine.  I was able to get him clear of clots as best I could tell but the urine never cleared completely.    Assessment: s/p Procedure(s): CYSTOSCOPY WITH CLOT EVACUATION; LEFT URETERAL STENT REMOVAL; BILATERAL RETROGRADE PYELOGRAM; BILATERAL URETERAL STENT PLACEMENT  He has recurrent clot retention with progressive ARI and anemia.  His  platelet count remains very low and that is contributing to the persistent hematuria.    Plan: I recommend that the catheter be irrigated q2-4hrs  And that he be given platelets to try keep the account at a higher level.   If he doesn't respond with reduced hematuria over the next couple of days, he may need repeat cystoscopy with fulguration but I don't know how much success we will have with the low platelet count.   He is not a candidate for bladder irrigations with prostaglandin, alum or formalin because of the stents.    Bilateral percs could be another options if the platelet count can be corrected.       LOS: 7 days    Aj Crunkleton J 10/03/2014

## 2014-10-03 NOTE — Progress Notes (Signed)
[facility] notified, spoke with Roselyn Reef and message left with update on patient for Dr.Wrenn.

## 2014-10-03 NOTE — Progress Notes (Signed)
26 MD notified of critical lab Hgb 5.4

## 2014-10-03 NOTE — Progress Notes (Signed)
Subjective: Interval History: Patient denies any difficulty in breathing. His appetite is good and no new complaint  Objective: Vital signs in last 24 hours: Temp:  [97.5 F (36.4 C)-97.6 F (36.4 C)] 97.5 F (36.4 C) (05/06 0223) Pulse Rate:  [86-120] 86 (05/06 0223) Resp:  [18] 18 (05/06 0223) BP: (82-106)/(41-60) 95/44 mmHg (05/06 0223) SpO2:  [100 %] 100 % (05/06 0223) Weight change:   Intake/Output from previous day: 05/05 0701 - 05/06 0700 In: 5161.5 [P.O.:600; I.V.:3458.5; Blood:983; IV Piggyback:120] Out: 1300 [Urine:850; Stool:450] Intake/Output this shift:    Generally patient is alert and in no apparent distress. Chest is clear to auscultation, no rales rhonchi or egophony. His heart exam revealed regular rate and rhythm no murmur no S3 Abdomen: Soft nontender Extremities trace  edema  Lab Results:  Recent Labs  10/01/14 0601 10/02/14 0726  WBC 13.6* 12.1*  HGB 8.9* 7.9*  HCT 26.2* 23.1*  PLT 31* 18*   BMET:   Recent Labs  10/01/14 0601 10/02/14 0726  NA 141 139  K 4.6 4.4  CL 118* 116*  CO2 16* 16*  GLUCOSE 98 121*  BUN 42* 54*  CREATININE 3.10* 3.55*  CALCIUM 7.3* 7.3*   No results for input(s): PTH in the last 72 hours. Iron Studies: No results for input(s): IRON, TIBC, TRANSFERRIN, FERRITIN in the last 72 hours.  Studies/Results: US Renal  10/02/2014   CLINICAL DATA:  Acute on chronic renal failure, hematuria, nephrolithiasis, bilateral ureteral stents  EXAM: RENAL / URINARY TRACT ULTRASOUND COMPLETE  COMPARISON:  CT abdomen pelvis dated 09/26/2014  FINDINGS: Right Kidney:  Length: 15.0 cm. Moderate hydronephrosis. Indwelling ureteral stent.  Left Kidney:  Length: 13.3 cm. Moderate hydronephrosis. Ureteral stent not visualized.  Bladder:  Moderately distended despite indwelling Foley catheter. Suspected layering debris/ hemorrhage within the bladder (image 40).  IMPRESSION: Moderate bilateral hydronephrosis.  Indwelling right ureteral stent.  Left ureteral stent not visualized. When correlating with prior CT, this is likely related to low position.  Bladder is moderately distended despite indwelling Foley catheter. Suspected layering debris/hemorrhage within the bladder. Consider bladder irrigation and/or cystoscopy.   Electronically Signed   By: Julian Hy M.D.   On: 10/02/2014 11:56    I have reviewed the patient's current medications.  Assessment/Plan: Problem #1 acute kidney injury: Obstructive. His BUN and creatinine remains high and presently BMP form today is pending. Presently on lasix. [place] still with bilateral hydronephrosis.  Problem #2 chronic renal failure: Stage III Problem #3 history of diabetes Problem #4 hypokalemia: His potassium is normal from yesterday Problem #5 bilateral hydronephrosis: His status post bilateral just stent placement and is hydronephrosis. Patient had 1300 cc of urine out put and his hematuria.Recurrent blood clot Problem #6 anemia: His hemoglobin is low  Problem #7 history of UTI : Patient is presently afebrile and his white blood cell count is high Problem #8. Low CO2 possibly metabolic. Started on sodium bicarbonate. His CO2 is remains low but stble Plan: We'll continue with IV hydration and lasix We'll check his basic metabolic panel in the morning.    LOS: 7 days   Lateia Fraser S 10/03/2014,8:45 AM

## 2014-10-03 NOTE — Progress Notes (Signed)
[facility] PROGRESS NOTE  BRANDIN STETZER PJA:[id number] DOB: [birth year] DOA: 09/26/2014 PCP: [name], MD    Code Status: Full code Family Communication: discussed with patient Disposition Plan: Discharge when clinically appropriate.   Consultants:  Urology  Nephrology  Procedures:  Cystoscopy with clot evacuation; left ureteral stent removal; bilateral retrograde pyelograms; bilateral ureteral stent placement, Dr. Tresa Moore, 09/26/14.  Antibiotics:  Cipro 4/30>>5/6  HPI/Subjective: Still has some abdominal discomfort. Noted several clots removed when foley was changed out yesterday. Wife notices swelling developing in legs. No shortness of breath.  Objective: Filed Vitals:   10/03/14 1156  BP: 111/44  Pulse: 83  Temp: 98.7 F (37.1 C)  Resp: 18   oxygen saturation 100%.  Intake/Output Summary (Last 24 hours) at 10/03/14 1219 Last data filed at 10/03/14 1141  Gross per 24 hour  Intake 5421.5 ml  Output    950 ml  Net 4471.5 ml   Filed Weights   09/26/14 1116 09/26/14 2017 10/02/14 0420  Weight: 87.091 kg (192 lb) 85.911 kg (189 lb 6.4 oz) 95.119 kg (209 lb 11.2 oz)    Exam:   General:  Alert 64 year old Caucasian man in no acute distress.  Cardiovascular: S1, S2, no murmurs rubs or gallops.  Respiratory: CTA B  Abdomen: Right-sided ileostomy with bag holding greenish bilious fluid/stool; bowel sounds active, nontender, nondistended.  GU: Indwelling Foley catheter noted; minimal output in foley bag since it was emptied this morning. Appears bloody.  Musculoskeletal/extremities: No acute hot red joints. 1+ pedal edema.   Neurologic: The patient is alert and oriented 3.  Data Reviewed: Basic Metabolic Panel:  Recent Labs Lab 09/29/14 0735 09/29/14 0739 09/30/14 0725 10/01/14 0601 10/02/14 0726 10/03/14 0910  NA  --  142 141 141 139 138  K  --  3.9 3.9 4.6 4.4 5.5*  CL  --  117* 117* 118* 116* 116*  CO2  --  19* 18* 16* 16*  15*  GLUCOSE  --  93 94 98 121* 122*  BUN  --  53* 41* 42* 54* 65*  CREATININE  --  3.39* 2.63* 3.10* 3.55* 4.80*  CALCIUM  --  7.7* 7.6* 7.3* 7.3* 6.6*  PHOS 4.9*  --   --   --   --   --    Liver Function Tests:  Recent Labs Lab 09/26/14 1230 09/27/14 0620  AST 49* 50*  ALT 45 46  ALKPHOS 179* 172*  BILITOT 1.5* 1.5*  PROT 6.1 5.9*  ALBUMIN 2.3* 2.2*   No results for input(s): LIPASE, AMYLASE in the last 168 hours. No results for input(s): AMMONIA in the last 168 hours. CBC:  Recent Labs Lab 09/26/14 1230  09/29/14 0739 09/30/14 0725 10/01/14 0601 10/02/14 0726 10/03/14 1012  WBC 5.9  < > 6.0 6.8 13.6* 12.1* 15.4*  NEUTROABS 4.8  --   --   --   --   --   --   HGB 7.8*  < > 9.1* 9.4* 8.9* 7.9* 5.4*  HCT 23.2*  < > 26.5* 27.4* 26.2* 23.1* 16.0*  MCV 92.1  < > 90.8 92.6 93.6 93.5 90.4  PLT 47*  < > 49* 44* 31* 18* 40*  < > = values in this interval not displayed. Cardiac Enzymes: No results for input(s): CKTOTAL, CKMB, CKMBINDEX, TROPONINI in the last 168 hours. BNP (last 3 results) No results for input(s): BNP in the last 8760 hours.  ProBNP (last 3 results) No results for input(s): PROBNP in the last 8760  hours.  CBG:  Recent Labs Lab 10/02/14 2104 10/03/14 0006 10/03/14 0356 10/03/14 0800 10/03/14 1146  GLUCAP 151* 139* 119* 118* 114*    Recent Results (from the past 240 hour(s))  Urine culture     Status: None   Collection Time: 09/26/14  1:00 PM  Result Value Ref Range Status   Specimen Description URINE, RANDOM  Final   Special Requests NONE  Final   Colony Count NO GROWTH Performed at [facility]   Final   Culture NO GROWTH Performed at [facility]   Final   Report Status 09/27/2014 FINAL  Final     Studies: US Renal  10/02/2014   CLINICAL DATA:  Acute on chronic renal failure, hematuria, nephrolithiasis, bilateral ureteral stents  EXAM: RENAL / URINARY TRACT ULTRASOUND COMPLETE  COMPARISON:  CT abdomen pelvis dated  09/26/2014  FINDINGS: Right Kidney:  Length: 15.0 cm. Moderate hydronephrosis. Indwelling ureteral stent.  Left Kidney:  Length: 13.3 cm. Moderate hydronephrosis. Ureteral stent not visualized.  Bladder:  Moderately distended despite indwelling Foley catheter. Suspected layering debris/ hemorrhage within the bladder (image 40).  IMPRESSION: Moderate bilateral hydronephrosis.  Indwelling right ureteral stent. Left ureteral stent not visualized. When correlating with prior CT, this is likely related to low position.  Bladder is moderately distended despite indwelling Foley catheter. Suspected layering debris/hemorrhage within the bladder. Consider bladder irrigation and/or cystoscopy.   Electronically Signed   By: Julian Hy M.D.   On: 10/02/2014 11:56    Scheduled Meds: . sodium chloride   Intravenous Once  . ciprofloxacin  500 mg Oral Q breakfast  . folic acid  1 mg Oral Daily  . furosemide  100 mg Intravenous BID  . hydrocortisone cream  1 application Topical BID  . insulin aspart  0-9 Units Subcutaneous 6 times per day  . lidocaine  1 application Urethral Once  . magnesium oxide  400 mg Oral Daily  . pantoprazole  40 mg Oral Daily  . sodium bicarbonate  1,300 mg Oral TID   Continuous Infusions: . 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 The patient also presented with gross hematuria which has been subacute to chronic. CT of his abdomen and pelvis revealed severe left hydronephrosis with the left stent in place and new mild right hydronephrosis. Urologist, Dr. Collene Mares performed cystoscopy and bilateral ureteral stent placement. Nephrologist, Dr. Lowanda Foster was consulted and started the patient on IV  fluids and Lasix with adjustments. Patient's creatinine is improving progressively. The patient has been transfused a total of 4 units of packed red blood cells and 3 units of platelets for persistent blood loss anemia from gross hematuria. 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   Assessment and plan: Principal Problem:   Bilateral hydronephrosis Active Problems:   DM type 2 (diabetes mellitus, type 2)   Adenocarcinoma of rectum   Pancytopenia, acquired   Thrombocytopenia   Acute blood loss anemia   Gross hematuria   Hyperkalemia   Acute kidney injury   Anemia of chronic disease   Obstructive uropathy   Recurrent UTI   1. Acute kidney injury, likely from obstructive uropathy. The patient's baseline creatinine from early March  2016 was within normal limits. Over the last couple weeks, it has progressively increased. On admission, his creatinine was 3.86. He was started on vigorous IV fluids and urologist, Dr. Tresa Moore performed cystoscopy and bilateral ureteral stent placement. Nephrology was consulted. Dr. Lowanda Foster agreed with IV fluid hydration and started the patient on Lasix 100 mg IV twice daily.   Renal ultrasound on 4/30 revealed Post right-sided double-J ureteral stent placement with minimal improvement in persistent mild right-sided pelvicaliectasis;Mild to moderate residual left-sided pelvicaliectasis, improved since abdominal CT performed 09/26/2014. - Unfortunately, his renal function continues to deteriorate. Creatinine today is 4.8. Discussed with nephrology and there are concerns that his renal failure may be related more to an obstructive process. Renal ultrasound was repeated on 5/5 that showed enlarged bladder despite presence of urinary catheter and showed continued bilateral hydronephrosis. His foley catheter is being frequently flushed, without significant urine output. He is being  aggressively hydrated and I would anticipate more urine output. I am not sure if patient would benefit from continuous bladder irrigation or repeat cystoscopy. Urology will see the patient again today. Perhaps patient would benefit from transfer to [facility], where he can be seen by urology more regularly. Will need to discuss further with Dr. Jeffie Pollock  Hyperkalemia; hypokalemia. Currently potassium is 5.5. Will give one dose of kayexalate.  Obstructive uropathy/hydronephrosis. The patient has a history of left hydronephrosis and had a stent placement by urology in the past. On admission, CT of his abdomen and pelvis revealed severe left hydronephrosis and new mild right hydronephrosis. Following admission, Dr. Tresa Moore performed cystoscopy, removal of the previous left stent and placement of bilateral ureteral stents. He has had follow-up renal ultrasounds which indicate persistent bilateral hydronephrosis and enlarged bladder. He is having frequent issues with clotting of his Foley catheter. His catheter has required to be changed multiple times thus far, the last time requiring coude catheter. He may benefit from continuous bladder irrigation, but will defer to urology. We'll continue to flush catheter every 3-4 hours.  Gross hematuria/pyuria/bacteriuria-treating as UTI. Urine culture ordered and is negative to date Cipro was started empirically. He has done 7 days of cipro. Will discontinue antibiotics. He is afebrile.  Acute and chronic blood loss anemia, superimposed on anemia of chronic disease. 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 His hemoglobin was 9.4 on 4/28 and 7.8 at the time of admission. He has been transfused a total of 4 units of packed red blood cellst. Unfortunately, his hemoglobin continues to trend down with ongoing hematuria. Hemoglobin today is 5.4. This is likely related to hematuria and  dilution from aggressive hydration. Will transfuse 3 more unit prbc today. We'll continue to monitor his CBC   Acquired pancytopenia/thrombocytopenia. Appears to be a chronic finding, in part, from chemotherapy/radiation/chronic disease and from bleeding. His platelet count was 78 on admission and has drifted down to 18 in the setting of gross GU bleeding. His baseline platelet count appears to be 49-58. He was transfused a total of  units of platelets.  Platelet count has improved since last transfusion. Continue to follow.  Type 2 diabetes mellitus. Apparently, he is treated with diet alone now. We'll continue sliding scale NovoLog, sensitive scale. His CBGs are well controlled.  Metastatic, stage IV adenocarcinoma of the rectum. The patient is followed by oncology and radiation oncology. He is being treated with XRT and single agent chemotherapy per review of the notes and per patient  history. Apparently, bone marrow biopsy was recommended but the patient had refused. Oncology was notified of the patient's hospitalization.  Isolated elevated blood pressure; he has a history of hypertension. On when necessary hydralazine and continue to monitor for need for oral medication.    Time spent: 72mnutes    MEMON,JEHANZEB  [facility] Pager [phone] If 7PM-7AM, please contact night-coverage at [email or web address], password [email or web address]5/10/2014, 12:19 PM  LOS: 7 days

## 2014-10-04 DIAGNOSIS — N390 Urinary tract infection, site not specified: Secondary | ICD-10-CM | POA: Diagnosis not present

## 2014-10-04 DIAGNOSIS — N179 Acute kidney failure, unspecified: Secondary | ICD-10-CM | POA: Diagnosis not present

## 2014-10-04 DIAGNOSIS — R944 Abnormal results of kidney function studies: Secondary | ICD-10-CM | POA: Diagnosis not present

## 2014-10-04 DIAGNOSIS — N1330 Unspecified hydronephrosis: Secondary | ICD-10-CM

## 2014-10-04 LAB — CBC
HCT: 21.6 % — ABNORMAL LOW (ref 39.0–52.0)
Hemoglobin: 7.6 g/dL — ABNORMAL LOW (ref 13.0–17.0)
MCH: 31.4 pg (ref 26.0–34.0)
MCHC: 35.2 g/dL (ref 30.0–36.0)
MCV: 89.3 fL (ref 78.0–100.0)
PLATELETS: 37 10*3/uL — AB (ref 150–400)
RBC: 2.42 MIL/uL — ABNORMAL LOW (ref 4.22–5.81)
RDW: 15.9 % — AB (ref 11.5–15.5)
WBC: 9.4 10*3/uL (ref 4.0–10.5)

## 2014-10-04 LAB — GLUCOSE, CAPILLARY
GLUCOSE-CAPILLARY: 93 mg/dL (ref 70–99)
GLUCOSE-CAPILLARY: 95 mg/dL (ref 70–99)
GLUCOSE-CAPILLARY: 145 mg/dL — AB (ref 70–99)
Glucose-Capillary: 131 mg/dL — ABNORMAL HIGH (ref 70–99)
Glucose-Capillary: 106 mg/dL — ABNORMAL HIGH (ref 70–99)

## 2014-10-04 LAB — BASIC METABOLIC PANEL
ANION GAP: 8 (ref 5–15)
BUN: 73 mg/dL — ABNORMAL HIGH (ref 6–20)
CHLORIDE: 113 mmol/L — AB (ref 101–111)
CO2: 18 mmol/L — ABNORMAL LOW (ref 22–32)
Calcium: 7.1 mg/dL — ABNORMAL LOW (ref 8.9–10.3)
Creatinine, Ser: 5.41 mg/dL — ABNORMAL HIGH (ref 0.61–1.24)
GFR calc Af Amer: 12 mL/min — ABNORMAL LOW (ref >60)
GFR calc non Af Amer: 10 mL/min — ABNORMAL LOW (ref >60)
Glucose, Bld: 105 mg/dL — ABNORMAL HIGH (ref 70–99)
POTASSIUM: 4.6 mmol/L (ref 3.5–5.1)
SODIUM: 139 mmol/L (ref 135–145)

## 2014-10-04 LAB — PREPARE RBC (CROSSMATCH)

## 2014-10-04 LAB — PREPARE PLATELET PHERESIS: Unit division: 0

## 2014-10-04 MED ORDER — SODIUM CHLORIDE 0.9 % IV SOLN
Freq: Once | INTRAVENOUS | Status: AC
  Administered 2014-10-04: 16:00:00 via INTRAVENOUS

## 2014-10-04 MED ORDER — FUROSEMIDE 10 MG/ML IJ SOLN
160.0000 mg | Freq: Two times a day (BID) | INTRAVENOUS | Status: DC
  Administered 2014-10-04 – 2014-10-05 (×2): 160 mg via INTRAVENOUS
  Filled 2014-10-04 (×4): qty 16

## 2014-10-04 NOTE — Progress Notes (Signed)
[facility] PROGRESS NOTE  [name] QQI:[id number] DOB: Nov 24, 1950 DOA: 09/26/2014 PCP: [name], MD    Code Status: Full code Family Communication: discussed with patient Disposition Plan: Discharge when clinically appropriate.   Consultants:  Urology  Nephrology  Procedures:  Cystoscopy with clot evacuation; left ureteral stent removal; bilateral retrograde pyelograms; bilateral ureteral stent placement, Dr. Tresa Moore, 09/26/14.  Antibiotics:  Cipro 4/30>>5/6  HPI/Subjective: Feeling better today. In good spirits. Notes that urine in foley has cleared up. No abdominal discomfort. No shortness of breath  Objective: Filed Vitals:   10/04/14 0615  BP: 113/46  Pulse: 82  Temp: 98 F (36.7 C)  Resp: 18     Intake/Output Summary (Last 24 hours) at 10/04/14 1454 Last data filed at 10/04/14 1038  Gross per 24 hour  Intake 3763.5 ml  Output   2025 ml  Net 1738.5 ml   Filed Weights   09/26/14 1116 09/26/14 2017 10/02/14 0420  Weight: 87.091 kg (192 lb) 85.911 kg (189 lb 6.4 oz) 95.119 kg (209 lb 11.2 oz)    Exam:   General:  Alert 64 year old Caucasian man in no acute distress.  Cardiovascular: S1, S2, no murmurs rubs or gallops.  Respiratory: CTA B  Abdomen: Right-sided ileostomy with bag holding greenish bilious fluid/stool; bowel sounds active, nontender, nondistended.  GU: Indwelling Foley catheter noted with clear urine noted in tubing.  Musculoskeletal/extremities: No acute hot red joints. 2+ pedal edema.   Neurologic: The patient is alert and oriented 3.  Data Reviewed: Basic Metabolic Panel:  Recent Labs Lab 09/29/14 0735  09/30/14 0725 10/01/14 0601 10/02/14 0726 10/03/14 0910 10/04/14 9417  NA  --   < > 141 141 139 138 139  K  --   < > 3.9 4.6 4.4 5.5* 4.6  CL  --   < > 117* 118* 116* 116* 113*  CO2  --   < > 18* 16* 16* 15* 18*  GLUCOSE  --   < > 94 98 121* 122* 105*  BUN  --   < > 41* 42* 54* 65* 73*   CREATININE  --   < > 2.63* 3.10* 3.55* 4.80* 5.41*  CALCIUM  --   < > 7.6* 7.3* 7.3* 6.6* 7.1*  PHOS 4.9*  --   --   --   --   --   --   < > = values in this interval not displayed. Liver Function Tests: No results for input(s): AST, ALT, ALKPHOS, BILITOT, PROT, ALBUMIN in the last 168 hours. No results for input(s): LIPASE, AMYLASE in the last 168 hours. No results for input(s): AMMONIA in the last 168 hours. CBC:  Recent Labs Lab 09/30/14 0725 10/01/14 0601 10/02/14 0726 10/03/14 1012 10/04/14 0632  WBC 6.8 13.6* 12.1* 15.4* 9.4  HGB 9.4* 8.9* 7.9* 5.4* 7.6*  HCT 27.4* 26.2* 23.1* 16.0* 21.6*  MCV 92.6 93.6 93.5 90.4 89.3  PLT 44* 31* 18* 40* 37*   Cardiac Enzymes: No results for input(s): CKTOTAL, CKMB, CKMBINDEX, TROPONINI in the last 168 hours. BNP (last 3 results) No results for input(s): BNP in the last 8760 hours.  ProBNP (last 3 results) No results for input(s): PROBNP in the last 8760 hours.  CBG:  Recent Labs Lab 10/03/14 2025 10/03/14 2349 10/04/14 0405 10/04/14 0751 10/04/14 1156  GLUCAP 123* 112* 93 95 145*    Recent Results (from the past 240 hour(s))  Urine culture     Status: None   Collection Time: 09/26/14  1:00 PM  Result Value Ref Range Status   Specimen Description URINE, RANDOM  Final   Special Requests NONE  Final   Colony Count NO GROWTH Performed at [facility]   Final   Culture NO GROWTH Performed at [facility]   Final   Report Status 09/27/2014 FINAL  Final     Studies: No results found.  Scheduled Meds: . feeding supplement (ENSURE ENLIVE)  237 mL Oral BID BM  . feeding supplement (PRO-STAT SUGAR FREE 64)  30 mL Oral BID  . folic acid  1 mg Oral Daily  . furosemide  160 mg Intravenous BID  . hydrocortisone cream  1 application Topical BID  . insulin aspart  0-9 Units Subcutaneous 6 times per day  . lidocaine  1 application Urethral Once  . magnesium oxide  400 mg Oral Daily  . pantoprazole  40 mg  Oral Daily  . 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 The patient also presented with gross hematuria which has been subacute to chronic. CT of his abdomen and pelvis revealed severe left hydronephrosis with the left stent in place and new mild right hydronephrosis. Urologist, Dr. Tresa Moore performed cystoscopy and bilateral ureteral stent placement. Nephrologist, Dr. Lowanda Foster was consulted and started the patient on IV fluids and Lasix with adjustments. Patient's creatinine is improving progressively. The patient has been transfused a total of 7 units of packed red blood cells and 4 units of platelets for persistent blood loss anemia from gross hematuria. 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   Assessment and plan: Principal Problem:   Bilateral hydronephrosis Active Problems:   DM type 2 (diabetes mellitus, type 2)   Adenocarcinoma of rectum   Pancytopenia, acquired   Thrombocytopenia   Acute blood loss anemia   Gross hematuria   Hyperkalemia   Acute kidney injury   Anemia of chronic disease   Obstructive uropathy   Recurrent UTI   1. Acute kidney injury, likely from obstructive uropathy. The patient's baseline creatinine from early March 2016 was within normal limits. Over the last couple weeks, it has progressively increased. On admission, his creatinine was 3.86. He was started on vigorous IV fluids and urologist, Dr. Tresa Moore performed cystoscopy and bilateral ureteral stent placement. Nephrology was consulted and has been managing fluids/lasix dosing.   Renal ultrasound on 4/30 revealed Post  right-sided double-J ureteral stent placement with minimal improvement in persistent mild right-sided pelvicaliectasis;Mild to moderate residual left-sided pelvicaliectasis, improved since abdominal CT performed 09/26/2014. Unfortunately, his renal function continues to deteriorate. Creatinine today is 5.4. Foley catheter was extensively flushed yesterday with removal of several clots. Urine is now clear. Nephrology also following and has discontinued IV fluids today and increased lasix dosing. Continue to follow.  Hyperkalemia; hypokalemia. Improved with kayexalate  Obstructive uropathy/hydronephrosis. The patient has a history of left hydronephrosis and had a stent placement by urology in the past. On admission, CT of his abdomen and pelvis revealed severe left hydronephrosis and new mild right hydronephrosis. Following admission, Dr. Tresa Moore performed cystoscopy, removal of the previous left stent and placement of bilateral ureteral stents. He has had follow-up renal ultrasounds which  indicate persistent bilateral hydronephrosis and enlarged bladder. Seen by Dr. Jeffie Pollock on 5/6 with extensive flushing of his catheter and removal of several clots. Urine appears to be clear now. Will continue to flush catheter q2-4H  Gross hematuria/pyuria/bacteriuria-treating as UTI. Urine culture ordered and is negative to date Cipro was started empirically. He has done 7 days of cipro. Antibiotics have been discontinued. He is afebrile.  Acute and chronic blood loss anemia, superimposed on anemia of chronic disease. 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 His hemoglobin was 9.4 on 4/28 and 7.8 at the time of admission. He has been transfused a total of 7 units of packed red blood cells. Hemoglobin has improved since yesterday. Will transfuse one more unit prbc today since hemoglobin remains low. We'll continue to monitor his  CBC   Acquired pancytopenia/thrombocytopenia. Appears to be a chronic finding, in part, from chemotherapy/radiation/chronic disease and from bleeding. His platelet count was 78 on admission and has drifted down to 18 in the setting of gross GU bleeding. His baseline platelet count appears to be 49-58. He was transfused a total of 4 units of platelets.  His platelet count is unchanged from yesterday, but his bleeding appears to be improving. Continue to follow.  Type 2 diabetes mellitus. Apparently, he is treated with diet alone now. We'll continue sliding scale NovoLog, sensitive scale. His CBGs are well controlled.  Metastatic, stage IV adenocarcinoma of the rectum. The patient is followed by oncology and radiation oncology. He is being treated with XRT and single agent chemotherapy per review of the notes and per patient history. Apparently, bone marrow biopsy was recommended but the patient had refused. Oncology was notified of the patient's hospitalization.  Isolated elevated blood pressure; he has a history of hypertension. On when necessary hydralazine and continue to monitor for need for oral medication.    Time spent: 13mnutes    Jalene Lacko  [facility] Pager [phone] If 7PM-7AM, please contact night-coverage at [email or web address], password [email or web address]5/11/2014, 2:54 PM  LOS: 8 days

## 2014-10-04 NOTE — Progress Notes (Signed)
Irrigated pt's foley with 30 cc of normal saline. Urine returned was pink. Pt tolerated well. Pt resting comfortably in bed.

## 2014-10-04 NOTE — Progress Notes (Addendum)
Patient ID: [name], male   DOB: Jul 23, 1950, 64 y.o.   MRN: [id number]  Pt without complaints. Reports urine has cleared and he hasn't had trouble with foley draining.   Filed Vitals:   10/04/14 1619  BP: 119/52  Pulse: 81  Temp: 97.8 F (36.6 C)  Resp: 17    Intake/Output Summary (Last 24 hours) at 10/04/14 1649 Last data filed at 10/04/14 1604  Gross per 24 hour  Intake   2722 ml  Output   2025 ml  Net    697 ml   + 1L urine in bag  PE: NAD GU: penile edema but also diffuse LE edema. Skin healthy.  Foley - urine clear. I irrigated a few old clots. Irrigated normally.   CBC    Component Value Date/Time   WBC 9.4 10/04/2014 0632   RBC 2.42* 10/04/2014 0632   HGB 7.6* 10/04/2014 0632   HCT 21.6* 10/04/2014 0632   PLT 37* 10/04/2014 0632   MCV 89.3 10/04/2014 0632   MCH 31.4 10/04/2014 0632   MCHC 35.2 10/04/2014 0632   RDW 15.9* 10/04/2014 0632   LYMPHSABS 0.7 09/26/2014 1230   MONOABS 0.4 09/26/2014 1230   EOSABS 0.1 09/26/2014 1230   BASOSABS 0.0 09/26/2014 1230    BMET    Component Value Date/Time   NA 139 10/04/2014 0632   K 4.6 10/04/2014 0632   CL 113* 10/04/2014 0632   CO2 18* 10/04/2014 0632   GLUCOSE 105* 10/04/2014 0632   BUN 73* 10/04/2014 0632   CREATININE 5.41* 10/04/2014 0632   CALCIUM 7.1* 10/04/2014 0632   GFRNONAA 10* 10/04/2014 0632   GFRAA 12* 10/04/2014 3383     A/P: Acute on chronic renal failure - multifactorial. Pt getting another unit of blood now. The resuscitation has helped his kidney function. His blood pressure is up compared to yesterday and he's made a lot of urine today. I would expect his bun and Cr to improve some.  Hemorrhagic cystitis - much improved. I irrigated bladder and got only a few small old clots. There does not appear to be significant ongoing bleeding.

## 2014-10-04 NOTE — Progress Notes (Signed)
Blaine.Garnet MD notified of lab result Hgb 7.6 this am.

## 2014-10-04 NOTE — Progress Notes (Signed)
Subjective: Interval History: Patient denies any nausea or vomiting. His appetite is not that great. Presently he denies any difficulty in breathing.  Objective: Vital signs in last 24 hours: Temp:  [97.4 F (36.3 C)-98.7 F (37.1 C)] 98 F (36.7 C) (05/07 0615) Pulse Rate:  [80-90] 82 (05/07 0615) Resp:  [16-18] 18 (05/07 0615) BP: (97-114)/(44-87) 113/46 mmHg (05/07 0615) SpO2:  [96 %-100 %] 99 % (05/07 0615) Weight change:   Intake/Output from previous day: 05/06 0701 - 05/07 0700 In: 4867.6 [P.O.:360; I.V.:2892.5; Blood:1375.1; IV Piggyback:60] Out: 1725 [Urine:1225; Stool:500] Intake/Output this shift:    Generally patient is alert and in no apparent distress. Chest is clear to auscultation, no rales rhonchi or egophony. His heart exam revealed regular rate and rhythm no murmur no S3 Abdomen: Soft nontender Extremities 1-2+ edema  Lab Results:  Recent Labs  10/03/14 1012 10/04/14 0632  WBC 15.4* 9.4  HGB 5.4* 7.6*  HCT 16.0* 21.6*  PLT 40* 37*   BMET:   Recent Labs  10/03/14 0910 10/04/14 0632  NA 138 139  K 5.5* 4.6  CL 116* 113*  CO2 15* 18*  GLUCOSE 122* 105*  BUN 65* 73*  CREATININE 4.80* 5.41*  CALCIUM 6.6* 7.1*   No results for input(s): PTH in the last 72 hours. Iron Studies: No results for input(s): IRON, TIBC, TRANSFERRIN, FERRITIN in the last 72 hours.  Studies/Results: US Renal  10/02/2014   CLINICAL DATA:  Acute on chronic renal failure, hematuria, nephrolithiasis, bilateral ureteral stents  EXAM: RENAL / URINARY TRACT ULTRASOUND COMPLETE  COMPARISON:  CT abdomen pelvis dated 09/26/2014  FINDINGS: Right Kidney:  Length: 15.0 cm. Moderate hydronephrosis. Indwelling ureteral stent.  Left Kidney:  Length: 13.3 cm. Moderate hydronephrosis. Ureteral stent not visualized.  Bladder:  Moderately distended despite indwelling Foley catheter. Suspected layering debris/ hemorrhage within the bladder (image 40).  IMPRESSION: Moderate bilateral  hydronephrosis.  Indwelling right ureteral stent. Left ureteral stent not visualized. When correlating with prior CT, this is likely related to low position.  Bladder is moderately distended despite indwelling Foley catheter. Suspected layering debris/hemorrhage within the bladder. Consider bladder irrigation and/or cystoscopy.   Electronically Signed   By: Julian Hy M.D.   On: 10/02/2014 11:56    I have reviewed the patient's current medications.  Assessment/Plan: Problem #1 acute kidney injury: Obstructive. His BUN and creatinine continued to increase. Presently patient is non-oliguric. Patient however showing increasing edema even though he is asymptomatic. Problem #2 chronic renal failure: Stage III Problem #3 history of diabetes Problem #4 hypokalemia: His potassium has corrected. Problem #5 bilateral hydronephrosis: His status post bilateral just stent placement and is hydronephrosis. Patient had 1300 cc of urine out put and his hematuria.Recurrent blood clot. His Foley catheter was changed yesterday presently his urine output is better and urine is also clear of blood. Problem #6 anemia: His hemoglobin is low colon status post blood transfusion Problem #7 history of UTI : Patient is presently afebrile and his white blood cell count also has come to a normal level. Problem #8. Low CO2 possibly metabolic. Patient presently on sodium bicarbonate. His CO2 is remains low but improving. Plan: 1] We'll DC IV fluid  2] will increase Lasix to 160 mg IV twice a day 3] We'll check his basic metabolic panel in the morning.    LOS: 8 days   Jacobi Nile S 10/04/2014,8:58 AM

## 2014-10-05 LAB — TYPE AND SCREEN
ABO/RH(D): A POS
Antibody Screen: NEGATIVE
UNIT DIVISION: 0
Unit division: 0
Unit division: 0
Unit division: 0
Unit division: 0

## 2014-10-05 LAB — GLUCOSE, CAPILLARY
GLUCOSE-CAPILLARY: 113 mg/dL — AB (ref 70–99)
Glucose-Capillary: 112 mg/dL — ABNORMAL HIGH (ref 70–99)
Glucose-Capillary: 107 mg/dL — ABNORMAL HIGH (ref 70–99)
Glucose-Capillary: 123 mg/dL — ABNORMAL HIGH (ref 70–99)
Glucose-Capillary: 121 mg/dL — ABNORMAL HIGH (ref 70–99)
Glucose-Capillary: 124 mg/dL — ABNORMAL HIGH (ref 70–99)

## 2014-10-05 LAB — CBC
HEMATOCRIT: 24.7 % — AB (ref 39.0–52.0)
HEMOGLOBIN: 8.5 g/dL — AB (ref 13.0–17.0)
MCH: 30.7 pg (ref 26.0–34.0)
MCHC: 34.4 g/dL (ref 30.0–36.0)
MCV: 89.2 fL (ref 78.0–100.0)
Platelets: 34 10*3/uL — ABNORMAL LOW (ref 150–400)
RBC: 2.77 MIL/uL — ABNORMAL LOW (ref 4.22–5.81)
RDW: 16.3 % — AB (ref 11.5–15.5)
WBC: 9 10*3/uL (ref 4.0–10.5)

## 2014-10-05 LAB — BASIC METABOLIC PANEL
Anion gap: 11 (ref 5–15)
BUN: 77 mg/dL — ABNORMAL HIGH (ref 6–20)
CALCIUM: 7.4 mg/dL — AB (ref 8.9–10.3)
CO2: 20 mmol/L — AB (ref 22–32)
CREATININE: 5.53 mg/dL — AB (ref 0.61–1.24)
Chloride: 108 mmol/L (ref 101–111)
GFR calc Af Amer: 12 mL/min — ABNORMAL LOW (ref >60)
GFR calc non Af Amer: 10 mL/min — ABNORMAL LOW (ref >60)
GLUCOSE: 118 mg/dL — AB (ref 70–99)
Potassium: 3.8 mmol/L (ref 3.5–5.1)
Sodium: 139 mmol/L (ref 135–145)

## 2014-10-05 MED ORDER — SODIUM CHLORIDE 0.9 % IV SOLN
INTRAVENOUS | Status: DC
  Administered 2014-10-05 – 2014-10-06 (×4): via INTRAVENOUS

## 2014-10-05 MED ORDER — FUROSEMIDE 10 MG/ML IJ SOLN
100.0000 mg | Freq: Two times a day (BID) | INTRAVENOUS | Status: DC
  Administered 2014-10-05 – 2014-10-06 (×2): 100 mg via INTRAVENOUS
  Filled 2014-10-05 (×4): qty 10

## 2014-10-05 NOTE — Progress Notes (Signed)
Irrigated pt's foley with 30 cc of normal saline. Urine returned was pink. Pt tolerated well. Pt resting comfortably in bed.

## 2014-10-05 NOTE — Progress Notes (Signed)
Subjective: Interval History: Patient feels good. He denies any difficulty breathing. At this moment she offers no complaints.  Objective: Vital signs in last 24 hours: Temp:  [97.4 F (36.3 C)-98.7 F (37.1 C)] 97.4 F (36.3 C) (05/08 7062) Pulse Rate:  [78-97] 80 (05/08 0632) Resp:  [16-18] 18 (05/08 0632) BP: (110-119)/(47-52) 113/52 mmHg (05/08 0632) SpO2:  [98 %-100 %] 99 % (05/08 3762) Weight change:   Intake/Output from previous day: 05/07 0701 - 05/08 0700 In: 2038.5 [P.O.:1080; I.V.:500; Blood:362.5; IV Piggyback:66] Out: 3700 [Urine:3250; Stool:450] Intake/Output this shift:    Generally patient is alert and in no apparent distress. Chest is clear to auscultation, no rales rhonchi or egophony. His heart exam revealed regular rate and rhythm no murmur no S3 Abdomen: Soft nontender Extremities trace to 1 + edema  Lab Results:  Recent Labs  10/04/14 0632 10/05/14 0613  WBC 9.4 9.0  HGB 7.6* 8.5*  HCT 21.6* 24.7*  PLT 37* 34*   BMET:   Recent Labs  10/04/14 0632 10/05/14 0617  NA 139 139  K 4.6 3.8  CL 113* 108  CO2 18* 20*  GLUCOSE 105* 118*  BUN 73* 77*  CREATININE 5.41* 5.53*  CALCIUM 7.1* 7.4*   No results for input(s): PTH in the last 72 hours. Iron Studies: No results for input(s): IRON, TIBC, TRANSFERRIN, FERRITIN in the last 72 hours.  Studies/Results: No results found.  I have reviewed the patient's current medications.  Assessment/Plan: Problem #1 acute kidney injury: Obstructive. His BUN and creatinine still increasing. The rate of increase in his creatinine however has slowed down. Problem #2 chronic renal failure: Stage III Problem #3 history of diabetes: Patient denies any polyuria or polydipsia. Problem #4 hypokalemia: His potassium is normal. Problem #5 bilateral hydronephrosis: His status post bilateral just stent placement and is hydronephrosis. Patient presently on a larger dose of Lasix and his urine output has improved. He  had 3200 mL of urine output. Including GI loss patient had about 3700 mL total. Problem #6 anemia: His hemoglobin is better after blood transfusion. Problem #7 history of UTI : Patient is presently afebrile and his white blood cell count also has come to a normal level. Problem #8. Low CO2 possibly metabolic. Patient presently on sodium bicarbonate. His CO2 is progressively improving. Plan: 1] We'll start patient on normal saline at 125 cc per hour  2] will decrease his Lasix to 100 mg IV twice a day 3] We'll check his basic metabolic panel in the morning.    LOS: 9 days   [name] 10/05/2014,9:26 AM

## 2014-10-05 NOTE — Progress Notes (Signed)
[facility] PROGRESS NOTE  [name] NTZ:[id number] DOB: [birth year] DOA: 09/26/2014 PCP: [name], MD    Code Status: Full code Family Communication: discussed with patient Disposition Plan: Discharge when clinically appropriate.   Consultants:  Urology  Nephrology  Procedures:  Cystoscopy with clot evacuation; left ureteral stent removal; bilateral retrograde pyelograms; bilateral ureteral stent placement, Dr. Tresa Moore, 09/26/14.  Antibiotics:  Cipro 4/30>>5/6  HPI/Subjective: Patient reports feeling better today. No shortness of breath. Reports abdominal discomfort has improved. Wants to ambulate today  Objective: Filed Vitals:   10/05/14 0632  BP: 113/52  Pulse: 80  Temp: 97.4 F (36.3 C)  Resp: 18     Intake/Output Summary (Last 24 hours) at 10/05/14 1222 Last data filed at 10/05/14 0700  Gross per 24 hour  Intake 1438.5 ml  Output   3400 ml  Net -1961.5 ml   Filed Weights   09/26/14 1116 09/26/14 2017 10/02/14 0420  Weight: 87.091 kg (192 lb) 85.911 kg (189 lb 6.4 oz) 95.119 kg (209 lb 11.2 oz)    Exam:   General:  Alert 64 year old Caucasian man in no acute distress.  Cardiovascular: S1, S2, RRR  Respiratory: clear bilaterally, no wheezing  Abdomen: Right-sided ileostomy with bag holding greenish bilious fluid/stool; bowel sounds active, nontender, obese.  GU: Indwelling Foley catheter noted with penile edema and clear urine noted in tubing.  Musculoskeletal/extremities: 2+ pedal edema.   Neurologic: The patient is alert and oriented 3.  Data Reviewed: Basic Metabolic Panel:  Recent Labs Lab 09/29/14 0735  10/01/14 0601 10/02/14 0726 10/03/14 0910 10/04/14 6759 10/05/14 0617  NA  --   < > 141 139 138 139 139  K  --   < > 4.6 4.4 5.5* 4.6 3.8  CL  --   < > 118* 116* 116* 113* 108  CO2  --   < > 16* 16* 15* 18* 20*  GLUCOSE  --   < > 98 121* 122* 105* 118*  BUN  --   < > 42* 54* 65* 73* 77*  CREATININE  --   <  > 3.10* 3.55* 4.80* 5.41* 5.53*  CALCIUM  --   < > 7.3* 7.3* 6.6* 7.1* 7.4*  PHOS 4.9*  --   --   --   --   --   --   < > = values in this interval not displayed. Liver Function Tests: No results for input(s): AST, ALT, ALKPHOS, BILITOT, PROT, ALBUMIN in the last 168 hours. No results for input(s): LIPASE, AMYLASE in the last 168 hours. No results for input(s): AMMONIA in the last 168 hours. CBC:  Recent Labs Lab 10/01/14 0601 10/02/14 0726 10/03/14 1012 10/04/14 0632 10/05/14 0613  WBC 13.6* 12.1* 15.4* 9.4 9.0  HGB 8.9* 7.9* 5.4* 7.6* 8.5*  HCT 26.2* 23.1* 16.0* 21.6* 24.7*  MCV 93.6 93.5 90.4 89.3 89.2  PLT 31* 18* 40* 37* 34*   Cardiac Enzymes: No results for input(s): CKTOTAL, CKMB, CKMBINDEX, TROPONINI in the last 168 hours. BNP (last 3 results) No results for input(s): BNP in the last 8760 hours.  ProBNP (last 3 results) No results for input(s): PROBNP in the last 8760 hours.  CBG:  Recent Labs Lab 10/04/14 2013 10/05/14 0013 10/05/14 0410 10/05/14 0739 10/05/14 1155  GLUCAP 106* 113* 112* 107* 123*    Recent Results (from the past 240 hour(s))  Urine culture     Status: None   Collection Time: 09/26/14  1:00 PM  Result Value Ref Range  Status   Specimen Description URINE, RANDOM  Final   Special Requests NONE  Final   Colony Count NO GROWTH Performed at [facility]   Final   Culture NO GROWTH Performed at [facility]   Final   Report Status 09/27/2014 FINAL  Final     Studies: No results found.  Scheduled Meds: . feeding supplement (ENSURE ENLIVE)  237 mL Oral BID BM  . feeding supplement (PRO-STAT SUGAR FREE 64)  30 mL Oral BID  . folic acid  1 mg Oral Daily  . furosemide  100 mg Intravenous BID  . hydrocortisone cream  1 application Topical BID  . insulin aspart  0-9 Units Subcutaneous 6 times per day  . lidocaine  1 application Urethral Once  . magnesium oxide  400 mg Oral Daily  . pantoprazole  40 mg Oral Daily  .  sodium bicarbonate  1,300 mg Oral TID   Continuous Infusions: . 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 The patient also presented with gross hematuria which has been subacute to chronic. CT of his abdomen and pelvis revealed severe left hydronephrosis with the left stent in place and new mild right hydronephrosis. Urologist, Dr. Tresa Moore performed cystoscopy and bilateral ureteral stent placement. Nephrologist, Dr. Lowanda Foster was consulted and started the patient on IV fluids and Lasix with adjustments. Patient's creatinine is improving progressively. The patient has been transfused a total of 7 units of packed red blood cells and 4 units of platelets for persistent blood loss anemia from gross hematuria. 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   Assessment and plan: Principal Problem:   Bilateral hydronephrosis Active Problems:   DM type 2 (diabetes mellitus, type 2)   Adenocarcinoma of rectum   Pancytopenia, acquired   Thrombocytopenia   Acute blood loss anemia   Gross hematuria   Hyperkalemia   Acute kidney injury   Anemia of chronic disease   Obstructive uropathy   Recurrent UTI   1. Acute kidney injury, likely from obstructive uropathy. The patient's baseline creatinine from early March 2016 was within normal limits. Over the last couple weeks, it has progressively increased. On admission, his creatinine was 3.86. He was started on vigorous IV fluids and urologist, Dr. Tresa Moore performed cystoscopy and bilateral ureteral stent placement. Nephrology was consulted and has been managing fluids/lasix dosing.   Renal  ultrasound on 4/30 revealed Post right-sided double-J ureteral stent placement with minimal improvement in persistent mild right-sided pelvicaliectasis;Mild to moderate residual left-sided pelvicaliectasis, improved since abdominal CT performed 09/26/2014. Unfortunately, his renal function has not quite improved yet. Creatinine today is 5.5. Foley catheter was extensively flushed yesterday with removal of several clots. Urine is now clear. Plan is to continue lasix and IV fluids. Urine output yesterday was excellent. Continue to follow.  Hyperkalemia; hypokalemia. Improved with kayexalate  Obstructive uropathy/hydronephrosis. The patient has a history of left hydronephrosis and had a stent placement by urology in the past. On admission, CT of his abdomen and pelvis revealed severe left hydronephrosis and new mild right hydronephrosis. Following admission, Dr. Tresa Moore performed cystoscopy, removal of the previous left stent and placement of bilateral ureteral stents. He has had follow-up  renal ultrasounds which indicate persistent bilateral hydronephrosis and enlarged bladder. Seen by Dr. Jeffie Pollock on 5/6 with extensive flushing of his catheter and removal of several clots. Urine appears to be clear now. Will continue to flush catheter q2-4H  Gross hematuria/pyuria/bacteriuria-treating as UTI. Urine culture ordered and is negative to date Cipro was started empirically. He has done 7 days of cipro. Antibiotics have been discontinued. He is afebrile.  Acute and chronic blood loss anemia, superimposed on anemia of chronic disease. 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 His hemoglobin was 9.4 on 4/28 and 7.8 at the time of admission. He has been transfused a total of 8 units of packed red blood cells. Hemoglobin has improved since yesterday.  We'll continue to monitor his CBC   Acquired  pancytopenia/thrombocytopenia. Appears to be a chronic finding, in part, from chemotherapy/radiation/chronic disease and from bleeding. His platelet count was 78 on admission and has drifted down to 18 in the setting of gross GU bleeding. His baseline platelet count appears to be 49-58. He was transfused a total of 4 units of platelets.  His platelet count has declined from yesterday, but his bleeding appears to be stable. Will hold off on further platelet transfusion. If thrombocytopenia continues to worsen, may need hematology input. Continue to follow.  Type 2 diabetes mellitus. Apparently, he is treated with diet alone now. We'll continue sliding scale NovoLog, sensitive scale. His CBGs are well controlled.  Metastatic, stage IV adenocarcinoma of the rectum. The patient is followed by oncology and radiation oncology. He is being treated with XRT and single agent chemotherapy per review of the notes and per patient history. Apparently, bone marrow biopsy was recommended but the patient had refused. Oncology was notified of the patient's hospitalization.  Isolated elevated blood pressure; he has a history of hypertension. On when necessary hydralazine and continue to monitor for need for oral medication.    Time spent: 22mnutes    [place] Moss  [facility] Pager [phone] If 7PM-7AM, please contact night-coverage at [email or web address], password [facility]5/12/2014, 12:22 PM  LOS: 9 days

## 2014-10-06 ENCOUNTER — Encounter (HOSPITAL_COMMUNITY): Payer: Managed Care, Other (non HMO)

## 2014-10-06 ENCOUNTER — Other Ambulatory Visit (HOSPITAL_COMMUNITY): Payer: Self-pay | Admitting: Oncology

## 2014-10-06 DIAGNOSIS — C7800 Secondary malignant neoplasm of unspecified lung: Secondary | ICD-10-CM

## 2014-10-06 DIAGNOSIS — D510 Vitamin B12 deficiency anemia due to intrinsic factor deficiency: Secondary | ICD-10-CM

## 2014-10-06 DIAGNOSIS — C787 Secondary malignant neoplasm of liver and intrahepatic bile duct: Secondary | ICD-10-CM

## 2014-10-06 LAB — IRON AND TIBC
Iron: 20 ug/dL — ABNORMAL LOW (ref 45–182)
Saturation Ratios: 13 % — ABNORMAL LOW (ref 17.9–39.5)
TIBC: 151 ug/dL — AB (ref 250–450)
UIBC: 131 ug/dL

## 2014-10-06 LAB — BASIC METABOLIC PANEL
Anion gap: 9 (ref 5–15)
BUN: 80 mg/dL — AB (ref 6–20)
CO2: 23 mmol/L (ref 22–32)
Calcium: 7.2 mg/dL — ABNORMAL LOW (ref 8.9–10.3)
Chloride: 108 mmol/L (ref 101–111)
Creatinine, Ser: 4.92 mg/dL — ABNORMAL HIGH (ref 0.61–1.24)
GFR calc Af Amer: 13 mL/min — ABNORMAL LOW (ref >60)
GFR, EST NON AFRICAN AMERICAN: 12 mL/min — AB (ref >60)
GLUCOSE: 125 mg/dL — AB (ref 70–99)
Potassium: 2.9 mmol/L — ABNORMAL LOW (ref 3.5–5.1)
Sodium: 140 mmol/L (ref 135–145)

## 2014-10-06 LAB — CBC
HCT: 24.1 % — ABNORMAL LOW (ref 39.0–52.0)
HEMOGLOBIN: 8.3 g/dL — AB (ref 13.0–17.0)
MCH: 30.5 pg (ref 26.0–34.0)
MCHC: 34.4 g/dL (ref 30.0–36.0)
MCV: 88.6 fL (ref 78.0–100.0)
Platelets: 25 10*3/uL — CL (ref 150–400)
RBC: 2.72 MIL/uL — AB (ref 4.22–5.81)
RDW: 16 % — ABNORMAL HIGH (ref 11.5–15.5)
WBC: 6.2 10*3/uL (ref 4.0–10.5)

## 2014-10-06 LAB — GLUCOSE, CAPILLARY
GLUCOSE-CAPILLARY: 123 mg/dL — AB (ref 70–99)
GLUCOSE-CAPILLARY: 119 mg/dL — AB (ref 70–99)
GLUCOSE-CAPILLARY: 130 mg/dL — AB (ref 70–99)
Glucose-Capillary: 109 mg/dL — ABNORMAL HIGH (ref 70–99)
Glucose-Capillary: 109 mg/dL — ABNORMAL HIGH (ref 70–99)
Glucose-Capillary: 133 mg/dL — ABNORMAL HIGH (ref 70–99)

## 2014-10-06 LAB — LACTATE DEHYDROGENASE: LDH: 132 U/L (ref 98–192)

## 2014-10-06 LAB — RETICULOCYTES
RBC.: 2.7 MIL/uL — ABNORMAL LOW (ref 4.22–5.81)
RETIC COUNT ABSOLUTE: 32.4 10*3/uL (ref 19.0–186.0)
RETIC CT PCT: 1.2 % (ref 0.4–3.1)

## 2014-10-06 LAB — FERRITIN: Ferritin: 579 ng/mL — ABNORMAL HIGH (ref 24–336)

## 2014-10-06 LAB — VITAMIN B12: VITAMIN B 12: 1491 pg/mL — AB (ref 180–914)

## 2014-10-06 MED ORDER — SODIUM CHLORIDE 0.45 % IV SOLN
INTRAVENOUS | Status: DC
  Administered 2014-10-06 – 2014-10-10 (×6): via INTRAVENOUS
  Filled 2014-10-06 (×14): qty 1000

## 2014-10-06 MED ORDER — FUROSEMIDE 10 MG/ML IJ SOLN
40.0000 mg | Freq: Two times a day (BID) | INTRAMUSCULAR | Status: DC
  Administered 2014-10-06: 40 mg via INTRAVENOUS
  Filled 2014-10-06 (×2): qty 4

## 2014-10-06 NOTE — Care Management Note (Signed)
Case Management Note  Patient Details  Name: [name] MRN: [id number] Date of Birth: Aug 20, 1950   Expected Discharge Date:  10/08/14               Expected Discharge Plan:  [place]  In-House Referral:     Discharge planning Services  CM Consult  Post Acute Care Choice:  Home Health Choice offered to:  Patient  DME Arranged:    DME Agency:     HH Arranged:  RN [place] Agency:  [place]  Status of Service:  In process, will continue to follow  Medicare Important Message Given:    Date Medicare IM Given:    Medicare IM give by:    Date Additional Medicare IM Given:    Additional Medicare Important Message give by:     If discussed at [place] of Stay Meetings, dates discussed:    Additional Comments: MD anticiaptes need for DC with [facility] RN for foley irrigation. Pt has used [facility] in the past. [facility] notified of referral (cigna requirements) and will contact CM once [facility] agency/services have been approved. Will cont to follow.  Sherald Barge, RN 10/06/2014, 12:02 PM

## 2014-10-06 NOTE — Progress Notes (Signed)
Subjective: Interval History: Patient offers no complaints. He denies any nausea or vomiting. His appetite is good and no difficulty in breathing.  Objective: Vital signs in last 24 hours: Temp:  [97.7 F (36.5 C)-98.6 F (37 C)] 97.7 F (36.5 C) (05/09 0634) Pulse Rate:  [75-95] 95 (05/09 0634) Resp:  [20] 20 (05/09 0634) BP: (107-120)/(52-63) 107/55 mmHg (05/09 0634) SpO2:  [96 %-99 %] 98 % (05/09 0634) Weight change:   Intake/Output from previous day: 05/08 0701 - 05/09 0700 In: 1954.2 [P.O.:840; I.V.:1054.2; IV Piggyback:60] Out: 4425 [Urine:4050; Stool:375] Intake/Output this shift:    Generally patient is alert and in no apparent distress. Chest is clear to auscultation, no rales rhonchi or egophony. His heart exam revealed regular rate and rhythm no murmur no S3 Abdomen: Soft nontender Extremities trace  edema  Lab Results:  Recent Labs  10/05/14 0613 10/06/14 0717  WBC 9.0 6.2  HGB 8.5* 8.3*  HCT 24.7* 24.1*  PLT 34* 25*   BMET:   Recent Labs  10/05/14 0617 10/06/14 0717  NA 139 140  K 3.8 2.9*  CL 108 108  CO2 20* 23  GLUCOSE 118* 125*  BUN 77* 80*  CREATININE 5.53* 4.92*  CALCIUM 7.4* 7.2*   No results for input(s): PTH in the last 72 hours. Iron Studies: No results for input(s): IRON, TIBC, TRANSFERRIN, FERRITIN in the last 72 hours.  Studies/Results: No results found.  I have reviewed the patient's current medications.  Assessment/Plan: Problem #1 acute kidney injury: Obstructive. His BUN and creatinine started improving. Patient is asymptomatic. Problem #2 chronic renal failure: Stage III Problem #3 history of diabetes: His blood sugar is reasonably controlled Problem #4 hypokalemia: His potassium has declined and mostly likely from diuretics. Problem #5 bilateral hydronephrosis: His status post bilateral just stent placement and is hydronephrosis. Patient presently on a larger dose of Lasix and his urine output has improved. Patient had  4000 mL of urine output. Problem #6 anemia: His hemoglobin is low but stable. Problem #7 history of UTI : Patient is presently afebrile and his white blood cell count also has come to a normal level. Problem #8. Low CO2 possibly metabolic. Patient presently on sodium bicarbonate. His CO2 has corrected. Plan: 1] We'll change IV fluid to half-normal saline with 40 mEq of KCl at 125 mL per hour.  2] will decrease his Lasix to 40  mg IV twice a day 3] We'll check his basic metabolic panel in the morning.    LOS: 10 days   [name] S 10/06/2014,9:54 AM

## 2014-10-06 NOTE — Progress Notes (Signed)
[facility] PROGRESS NOTE  [name] XIP:[id number] DOB: 28-Nov-1950 DOA: 09/26/2014 PCP: [name], MD    Code Status: Full code Family Communication: discussed with patient and wife Disposition Plan: Discharge when clinically appropriate.   Consultants:  Urology  Nephrology  Procedures:  Cystoscopy with clot evacuation; left ureteral stent removal; bilateral retrograde pyelograms; bilateral ureteral stent placement, Dr. Tresa Moore, 09/26/14.  Antibiotics:  Cipro 4/30>>5/6  HPI/Subjective: Patient does not have any abdominal pain, shortness of breath. He sat up in the chair yesterday. He wants to ambulate today.  Objective: Filed Vitals:   10/06/14 0634  BP: 107/55  Pulse: 95  Temp: 97.7 F (36.5 C)  Resp: 20     Intake/Output Summary (Last 24 hours) at 10/06/14 1444 Last data filed at 10/06/14 0634  Gross per 24 hour  Intake 1354.16 ml  Output   3250 ml  Net -1895.84 ml   Filed Weights   09/26/14 1116 09/26/14 2017 10/02/14 0420  Weight: 87.091 kg (192 lb) 85.911 kg (189 lb 6.4 oz) 95.119 kg (209 lb 11.2 oz)    Exam:   General:  Alert, sitting up in bed, in no acute distress.  Cardiovascular: S1, S2, RRR  Respiratory: CTA B  Abdomen: Right-sided ileostomy with bag holding greenish bilious fluid/stool; bowel sounds active, nontender, obese.  GU: Indwelling Foley catheter noted with penile edema and clear urine noted in tubing.  Musculoskeletal/extremities: 2+ pedal edema bilaterally.   Neurologic: The patient is alert and oriented 3.  Data Reviewed: Basic Metabolic Panel:  Recent Labs Lab 10/02/14 0726 10/03/14 0910 10/04/14 6734 10/05/14 0617 10/06/14 0717  NA 139 138 139 139 140  K 4.4 5.5* 4.6 3.8 2.9*  CL 116* 116* 113* 108 108  CO2 16* 15* 18* 20* 23  GLUCOSE 121* 122* 105* 118* 125*  BUN 54* 65* 73* 77* 80*  CREATININE 3.55* 4.80* 5.41* 5.53* 4.92*  CALCIUM 7.3* 6.6* 7.1* 7.4* 7.2*   Liver Function Tests: No  results for input(s): AST, ALT, ALKPHOS, BILITOT, PROT, ALBUMIN in the last 168 hours. No results for input(s): LIPASE, AMYLASE in the last 168 hours. No results for input(s): AMMONIA in the last 168 hours. CBC:  Recent Labs Lab 10/02/14 0726 10/03/14 1012 10/04/14 0632 10/05/14 0613 10/06/14 0717  WBC 12.1* 15.4* 9.4 9.0 6.2  HGB 7.9* 5.4* 7.6* 8.5* 8.3*  HCT 23.1* 16.0* 21.6* 24.7* 24.1*  MCV 93.5 90.4 89.3 89.2 88.6  PLT 18* 40* 37* 34* 25*   Cardiac Enzymes: No results for input(s): CKTOTAL, CKMB, CKMBINDEX, TROPONINI in the last 168 hours. BNP (last 3 results) No results for input(s): BNP in the last 8760 hours.  ProBNP (last 3 results) No results for input(s): PROBNP in the last 8760 hours.  CBG:  Recent Labs Lab 10/05/14 2007 10/06/14 0004 10/06/14 0429 10/06/14 0759 10/06/14 1222  GLUCAP 124* 109* 109* 123* 119*    No results found for this or any previous visit (from the past 240 hour(s)).   Studies: No results found.  Scheduled Meds: . feeding supplement (ENSURE ENLIVE)  237 mL Oral BID BM  . feeding supplement (PRO-STAT SUGAR FREE 64)  30 mL Oral BID  . folic acid  1 mg Oral Daily  . furosemide  40 mg Intravenous BID  . hydrocortisone cream  1 application Topical BID  . insulin aspart  0-9 Units Subcutaneous 6 times per day  . lidocaine  1 application Urethral Once  . magnesium oxide  400 mg Oral Daily  .  pantoprazole  40 mg Oral Daily  . sodium bicarbonate  1,300 mg Oral TID   Continuous Infusions: . 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 The patient also presented with gross hematuria which has been subacute to chronic. CT of his abdomen and pelvis revealed  severe left hydronephrosis with the left stent in place and new mild right hydronephrosis. Urologist, Dr. Tresa Moore performed cystoscopy and bilateral ureteral stent placement. Nephrologist, Dr. Lowanda Foster was consulted and started the patient on IV fluids and Lasix with adjustments. Patient's creatinine is improving progressively. The patient has been transfused a total of 7 units of packed red blood cells and 4 units of platelets for persistent blood loss anemia from gross hematuria. 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   Assessment and plan: Principal Problem:   Bilateral hydronephrosis Active Problems:   DM type 2 (diabetes mellitus, type 2)   Adenocarcinoma of rectum   Pancytopenia, acquired   Thrombocytopenia   Acute blood loss anemia   Gross hematuria   Hyperkalemia   Acute kidney injury   Anemia of chronic disease   Obstructive uropathy   Recurrent UTI   1. Acute kidney injury, likely from obstructive uropathy. The patient's baseline creatinine from early March 2016 was within normal limits. Over the last couple weeks, it has progressively increased. On admission, his creatinine was 3.86. He was started on vigorous IV fluids and urologist, Dr. Tresa Moore performed cystoscopy and bilateral ureteral stent placement. Nephrology was consulted and has been managing fluids/lasix dosing.   Renal ultrasound on 4/30 revealed Post right-sided double-J ureteral stent placement with minimal improvement in persistent mild right-sided pelvicaliectasis;Mild to moderate residual left-sided pelvicaliectasis, improved since abdominal CT performed 09/26/2014. Creatinine has started to improve with great urine output. Nephrology adjusting lasix/fluids. Continue to follow.  Hyperkalemia; hypokalemia. Improved with kayexalate  Obstructive uropathy/hydronephrosis. The patient has a history of left hydronephrosis and had a  stent placement by urology in the past. On admission, CT of his abdomen and pelvis revealed severe left hydronephrosis and new mild right hydronephrosis. Following admission, Dr. Tresa Moore performed cystoscopy, removal of the previous left stent and placement of bilateral ureteral stents. He has had follow-up renal ultrasounds which indicate persistent bilateral hydronephrosis and enlarged bladder. Seen by Dr. Jeffie Pollock on 5/6 with extensive flushing of his catheter and removal of several clots. Urine appears to be clear now. Will continue to flush catheter q2-4H  Gross hematuria/pyuria/bacteriuria-treating as UTI. Urine culture ordered and is negative to date Cipro was started empirically. He has done 7 days of cipro. Antibiotics have been discontinued. He is afebrile.  Acute and chronic blood loss anemia, superimposed on anemia of chronic disease. 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 His hemoglobin was 9.4 on 4/28 and 7.8 at the time of admission. He has been transfused a total of 8 units of packed red blood cells. Hemoglobin appears to be stable.  We'll continue to monitor his CBC   Acquired pancytopenia/thrombocytopenia. Appears to be a chronic finding, in part, from chemotherapy/radiation/chronic disease and from bleeding. His platelet count was 78 on admission and has drifted down to  18 in the setting of gross GU bleeding. His baseline platelet count appears to be 49-58. He was transfused a total of 4 units of platelets.  His platelet count has declined from yesterday, but his bleeding appears to be stable. Will hold off on further platelet transfusion. Will request hematology input regarding any further work up.  Type 2 diabetes mellitus. Apparently, he is treated with diet alone now. We'll continue sliding scale NovoLog, sensitive scale. His CBGs are well controlled.  Metastatic, stage IV  adenocarcinoma of the rectum. The patient is followed by oncology and radiation oncology. He is being treated with XRT and single agent chemotherapy per review of the notes and per patient history. Apparently, bone marrow biopsy was recommended but the patient had refused. Oncology was notified of the patient's hospitalization.  Isolated elevated blood pressure; he has a history of hypertension. On when necessary hydralazine and continue to monitor for need for oral medication.    Time spent: 32mnutes    [name]  [facility] Pager [phone] If 7PM-7AM, please contact night-coverage at [email or web address], password [facility]5/01/2015, 2:44 PM  LOS: 10 days

## 2014-10-06 NOTE — Progress Notes (Signed)
Irrigated pt's foley x3. Pt tolerated well. Urine is becoming free of clots. Will continue to monitor pt.

## 2014-10-06 NOTE — Consult Note (Signed)
[facility] Consultation Oncology  Name: [name]      MRN: [id number]    Location: A312/A312-01  Date: 10/06/2014 Time:4:59 PM   REFERRING PHYSICIAN:  Kathie Dike, MD  REASON FOR CONSULT:  Thrombocytopenia   DIAGNOSIS:  Pancytopenia in the setting of cirrhosis of liver, splenomegaly, esophageal varices with metastatic CRC.  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    Adenocarcinoma of rectum   04/11/2011 - 07/17/2012 Chemotherapy FOLFOX + Avastin   02/24/2014 Progression CT CAP-  Findings within the chest are worrisome for recurrent tumor with bilateral pulmonary metastasis.   03/17/2014 - 04/21/2014 Chemotherapy Erbitux single agent   04/28/2014 - 05/18/2014 Hospital Admission Hematuria,  AKI (acute kidney injury),  Obstructive uropathy,  Anemia,  Metabolic acidosis. S/p left PCN. Removed 12/20 by GU. Received 20 units of PRBCs and 11 units of platelets during this admission   06/09/2014 -  Chemotherapy Re-load with single-agent Erbitux   07/07/2014 Adverse Reaction Mild skin fissures of hands.  Secondary to Cetuximab.  Will treat supportively.   I personally reviewed and went over laboratory results with the patient.  The results are noted within this dictation.  Chart reviewed.   He continues to have some blood in his urine.  He is being followed by urology as an inpatient.    His renal function is poor with a sudden onset, thought be obstructive in nature.  He is being followed by nephrology.  PAST MEDICAL HISTORY:   Past Medical History  Diagnosis Date  . Pancytopenia, acquired 12/04/2010  . B12 deficiency 12/07/2010  . Kidney stones   . CAD (coronary artery disease)   . Anemia   .  History of radiation therapy 06/19/07 -07/27/07    whole pelvis  . Hx of radiation therapy 08/06/12,08/09/12,& 08/13/12    rul 56GY/71f  . Cancer     Colon ca dx 2008 surg/rad/chemo  . Lung cancer   . FH: chemotherapy   . Rectal bleeding 09/20/2013    From recurrent rectal mass/recurrent rectal adenocarcinoma  . Acute blood loss anemia 09/27/2013  . 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  2.7 cm primary, moderately differentiated with 3/8 positive lymph nodes without LVI.  Surgery was on 04/22/2007 and was treated postoperatively with radiation and concomitant capecitabine and then 4 cycles of a planned 6 with oxaliplatin and capecitabine.  His counts would not allow treatment of  . Hydronephrosis of left kidney 09/24/2013    Kidney stones; s/p cystoscopy and stent  . Gross hematuria 09/25/2103  . Lung metastases 07/09/2013  . Secondary malignancy of right lung 07/09/2013  . Insomnia 06/09/2014  . Foley catheter in place 07/02/14  . Broken collarbone feb 2016  . Myocardial infarction     2003   . Heart murmur   . Sleep apnea     hx of cpap no longer wears   . Diabetes mellitus without complication     hx of no longer on meds   . Hypertension     hx of   . Shortness of breath dyspnea     due to radiaiton treatment   .  GERD (gastroesophageal reflux disease)   . Bilateral hydronephrosis 09/27/2014    ALLERGIES: Allergies  Allergen Reactions  . Daypro [Oxaprozin] Nausea And Vomiting  . Xeloda [Capecitabine] Other (See Comments)    Homocidal and suicidal ideations      MEDICATIONS: I have reviewed the patient's current medications.     PAST SURGICAL HISTORY Past Surgical History  Procedure Laterality Date  . Ileostomy  12/07/2010    Procedure: ILEOSTOMY;  Surgeon: Donato Heinz;  Location: [facility];  Service: General;  Laterality: N/A;  Diverting Ileostomy Lysis of adhesions Exploratory  Laparotomy  . Lung biopsy  10/27/10    rt lobe- adenocarcinoma  . Cholecystectomy    . Cataract extraction w/ intraocular lens implant  2007    bil  . Lithotripsy  2002  . Coronary angioplasty with stent placement  2003    stenting x 2  . Colon resection  2008    x2  . Esophagogastroduodenoscopy N/A 05/02/2013    Dr. Gala Romney- polypoid antral fold, bx= reactive gastropathy  . Givens capsule study N/A 05/02/2013    Procedure: GIVENS CAPSULE STUDY;  Surgeon: Daneil Dolin, MD;  Location: [facility];  Service: Endoscopy;  Laterality: N/A;  . Colonoscopy N/A 09/20/2013    SLF:A near circumferential fungating mass with friable surfaces was found in the rectum.  ACTIVELY OOZING.  CLOTS SEEN IN LUMEN AND ASPIRATED.  Multiple biopsies were performed using cold forceps.  Thermal therapy(BICAP 7 Fr 25W) was used to ATTEMPT TO control bleeding.    HEMOSTASIS NOT ACHEIVED.  Marland Kitchen Cystoscopy w/ ureteral stent placement Left 09/24/2013    Procedure: CYSTOSCOPY WITH LEFT RETROGRADE PYELOGRAM; LEFT URETERAL STENT PLACEMENT; REMOVAL OF SMALL BLADDER CALCULI;  Surgeon: Marissa Nestle, MD;  Location: [facility];  Service: Urology;  Laterality: Left;  . Flexible sigmoidoscopy N/A 09/25/2013    OZH:YQMVHQION exophytic rectal mass most likely representing recurrent adenocarcinoma status post biopsy  . Cystoscopy with retrograde pyelogram, ureteroscopy and stent placement Left 05/08/2014    Procedure: CYSTOSCOPY/ EVACUATION OF CLOTS  WITH LEFT RETROGRADE PYELOGRAM,LEFT URETEROSCOPY ;  Surgeon: Malka So, MD;  Location: [facility];  Service: Urology;  Laterality: Left;  . Nephrolithotomy Left 05/13/2014    Procedure: LEFT NEPHROLITHOTOMY PERCUTANEOUS FIRST STAGE;  Surgeon: Malka So, MD;  Location: [facility];  Service: Urology;  Laterality: Left;  . Holmium laser application Left 62/95/2841    Procedure: HOLMIUM LASER APPLICATION;  Surgeon: Malka So, MD;  Location: [facility];  Service: Urology;  Laterality: Left;  .  Cystoscopy w/ ureteral stent placement Bilateral 09/26/2014    Procedure: CYSTOSCOPY WITH CLOT EVACUATION; LEFT URETERAL STENT REMOVAL; BILATERAL RETROGRADE PYELOGRAM; BILATERAL URETERAL STENT PLACEMENT;  Surgeon: Alexis Frock, MD;  Location: [facility];  Service: Urology;  Laterality: Bilateral;    FAMILY HISTORY: Family History  Problem Relation Age of Onset  . Cervical cancer Mother   . Rectal cancer Father 38  . Uterine cancer Sister     SOCIAL HISTORY:  reports that he has quit smoking. His smoking use included Cigarettes and Cigars. He has a 60 pack-year smoking history. He quit smokeless tobacco use about 9 years ago. His smokeless tobacco use included Chew. He reports that he does not drink alcohol or use illicit drugs.  PERFORMANCE STATUS: The patient's performance status is 1 - Symptomatic but completely ambulatory  PHYSICAL EXAM: Most Recent Vital Signs: Blood pressure 118/50, pulse 86, temperature 98 F (36.7 C), temperature source Oral, resp. rate 20, height 5'  9" (1.753 m), weight 209 lb 11.2 oz (95.119 kg), SpO2 99 %. General appearance: alert, cooperative, appears stated age and no distress Head: Normocephalic, without obvious abnormality, atraumatic Eyes: negative findings: lids and lashes normal, conjunctivae and sclerae normal and corneas clear Neck: supple, symmetrical, trachea midline Lungs: clear to auscultation bilaterally Heart: regular rate and rhythm Skin: Skin color, texture, turgor normal. No rashes or lesions Neurologic: Grossly normal Foley: Light red urine.  LABORATORY DATA:  Results for orders placed or performed during the hospital encounter of 09/26/14 (from the past 48 hour(s))  Glucose, capillary     Status: Abnormal   Collection Time: 10/04/14  5:08 PM  Result Value Ref Range   Glucose-Capillary 131 (H) 70 - 99 mg/dL   Comment 1 Notify RN    Comment 2 Document in Chart   Glucose, capillary     Status: Abnormal   Collection Time: 10/04/14  8:13  PM  Result Value Ref Range   Glucose-Capillary 106 (H) 70 - 99 mg/dL  Glucose, capillary     Status: Abnormal   Collection Time: 10/05/14 12:13 AM  Result Value Ref Range   Glucose-Capillary 113 (H) 70 - 99 mg/dL  Glucose, capillary     Status: Abnormal   Collection Time: 10/05/14  4:10 AM  Result Value Ref Range   Glucose-Capillary 112 (H) 70 - 99 mg/dL  CBC     Status: Abnormal   Collection Time: 10/05/14  6:13 AM  Result Value Ref Range   WBC 9.0 4.0 - 10.5 K/uL   RBC 2.77 (L) 4.22 - 5.81 MIL/uL   Hemoglobin 8.5 (L) 13.0 - 17.0 g/dL   HCT 24.7 (L) 39.0 - 52.0 %   MCV 89.2 78.0 - 100.0 fL   MCH 30.7 26.0 - 34.0 pg   MCHC 34.4 30.0 - 36.0 g/dL   RDW 16.3 (H) 11.5 - 15.5 %   Platelets 34 (L) 150 - 400 K/uL    Comment: SPECIMEN CHECKED FOR CLOTS PLATELET COUNT CONFIRMED BY SMEAR   Basic metabolic panel     Status: Abnormal   Collection Time: 10/05/14  6:17 AM  Result Value Ref Range   Sodium 139 135 - 145 mmol/L   Potassium 3.8 3.5 - 5.1 mmol/L    Comment: DELTA CHECK NOTED   Chloride 108 101 - 111 mmol/L   CO2 20 (L) 22 - 32 mmol/L   Glucose, Bld 118 (H) 70 - 99 mg/dL   BUN 77 (H) 6 - 20 mg/dL   Creatinine, Ser 5.53 (H) 0.61 - 1.24 mg/dL   Calcium 7.4 (L) 8.9 - 10.3 mg/dL   GFR calc non Af Amer 10 (L) >60 mL/min   GFR calc Af Amer 12 (L) >60 mL/min    Comment: (NOTE) The eGFR has been calculated using the CKD EPI equation. This calculation has not been validated in all clinical situations. eGFR's persistently <60 mL/min signify possible Chronic Kidney Disease.    Anion gap 11 5 - 15  Glucose, capillary     Status: Abnormal   Collection Time: 10/05/14  7:39 AM  Result Value Ref Range   Glucose-Capillary 107 (H) 70 - 99 mg/dL  Glucose, capillary     Status: Abnormal   Collection Time: 10/05/14 11:55 AM  Result Value Ref Range   Glucose-Capillary 123 (H) 70 - 99 mg/dL   Comment 1 Notify RN    Comment 2 Document in Chart   Glucose, capillary     Status: Abnormal  Collection Time: 10/05/14  4:23 PM  Result Value Ref Range   Glucose-Capillary 121 (H) 70 - 99 mg/dL   Comment 1 Notify RN    Comment 2 Document in Chart   Glucose, capillary     Status: Abnormal   Collection Time: 10/05/14  8:07 PM  Result Value Ref Range   Glucose-Capillary 124 (H) 70 - 99 mg/dL  Glucose, capillary     Status: Abnormal   Collection Time: 10/06/14 12:04 AM  Result Value Ref Range   Glucose-Capillary 109 (H) 70 - 99 mg/dL  Glucose, capillary     Status: Abnormal   Collection Time: 10/06/14  4:29 AM  Result Value Ref Range   Glucose-Capillary 109 (H) 70 - 99 mg/dL  Ferritin     Status: Abnormal   Collection Time: 10/06/14  7:17 AM  Result Value Ref Range   Ferritin 579 (H) 24 - 336 ng/mL    Comment: Performed at [facility]  Iron and TIBC     Status: Abnormal   Collection Time: 10/06/14  7:17 AM  Result Value Ref Range   Iron 20 (L) 45 - 182 ug/dL   TIBC 151 (L) 250 - 450 ug/dL   Saturation Ratios 13 (L) 17.9 - 39.5 %   UIBC 131 ug/dL    Comment: Performed at [facility] metabolic panel     Status: Abnormal   Collection Time: 10/06/14  7:17 AM  Result Value Ref Range   Sodium 140 135 - 145 mmol/L   Potassium 2.9 (L) 3.5 - 5.1 mmol/L    Comment: DELTA CHECK NOTED   Chloride 108 101 - 111 mmol/L   CO2 23 22 - 32 mmol/L   Glucose, Bld 125 (H) 70 - 99 mg/dL   BUN 80 (H) 6 - 20 mg/dL   Creatinine, Ser 4.92 (H) 0.61 - 1.24 mg/dL   Calcium 7.2 (L) 8.9 - 10.3 mg/dL   GFR calc non Af Amer 12 (L) >60 mL/min   GFR calc Af Amer 13 (L) >60 mL/min    Comment: (NOTE) The eGFR has been calculated using the CKD EPI equation. This calculation has not been validated in all clinical situations. eGFR's persistently <60 mL/min signify possible Chronic Kidney Disease.    Anion gap 9 5 - 15  CBC     Status: Abnormal   Collection Time: 10/06/14  7:17 AM  Result Value Ref Range   WBC 6.2 4.0 - 10.5 K/uL   RBC 2.72 (L) 4.22 - 5.81 MIL/uL    Hemoglobin 8.3 (L) 13.0 - 17.0 g/dL   HCT 24.1 (L) 39.0 - 52.0 %   MCV 88.6 78.0 - 100.0 fL   MCH 30.5 26.0 - 34.0 pg   MCHC 34.4 30.0 - 36.0 g/dL   RDW 16.0 (H) 11.5 - 15.5 %   Platelets 25 (LL) 150 - 400 K/uL    Comment: RESULT REPEATED AND VERIFIED PLATELET COUNT CONFIRMED BY SMEAR CRITICAL RESULT CALLED TO, READ BACK BY AND VERIFIED WITH: LISA BULLINS RNON [id number] AT 0730 BY RESSEGGER R   Glucose, capillary     Status: Abnormal   Collection Time: 10/06/14  7:59 AM  Result Value Ref Range   Glucose-Capillary 123 (H) 70 - 99 mg/dL  Glucose, capillary     Status: Abnormal   Collection Time: 10/06/14 12:22 PM  Result Value Ref Range   Glucose-Capillary 119 (H) 70 - 99 mg/dL  Glucose, capillary     Status: Abnormal  Collection Time: 10/06/14  4:47 PM  Result Value Ref Range   Glucose-Capillary 133 (H) 70 - 99 mg/dL   Comment 1 Notify RN       RADIOGRAPHY: No results found.     PATHOLOGY:  Nothing new  ASSESSMENT:  1. Pancytopenia, multifactorial. 2. 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  Cetuximab is an monoclonal antibody and does not typically cause immunosuppression. 3. Acute renal failure, followed by nephrology 4. Gross hematuria, followed by urology 5. Pernicious anemia requiring monthly B12 injections   PLAN:  1. I personally reviewed and went over laboratory results with the patient.  The results are noted within this dictation. 2. I personally reviewed and went over radiographic studies with the patient.  The results are noted within this dictation.   3. Chart reviewed 4. Labs: B12, LDH, Haptoglobin, Retic Count 5. Bone marrow aspiration and biopsy ordered via IR, please send for FISH, FLOW, Cytogenetics. 6. Recommend continued blood product support.  No need for special requirements regarding blood product preparation. 7. Will follow along as an inpatient.  All questions  were answered. The patient knows to call the clinic with any problems, questions or concerns. We can certainly see the patient much sooner if necessary.  Patient and plan discussed with Dr. Ancil Linsey and she is in agreement with the aforementioned.   [name],[name]  10/06/2014 5:11 PM      Agree with marrow. Unfortunately [name] prognosis is poor. BMBX will be helpful to r/o marrow pathology as a further contributor to his profound pancytopenia and current transfusion requirements. If BMBX reveals an underlying MDS etc. Hospice referral is appropriate. Donald Pore MD

## 2014-10-06 NOTE — Progress Notes (Signed)
CRITICAL VALUE ALERT  Critical value received: Platelets 25000  Date of notification:  10/06/2014  Time of notification:  0745  Critical value read back:Yes  Nurse who received alert: Vista Deck  MD notified (1st page): Dr. Roderic Palau  Time of first page: [phone]

## 2014-10-07 ENCOUNTER — Inpatient Hospital Stay (HOSPITAL_COMMUNITY): Admit: 2014-10-07 | Payer: Managed Care, Other (non HMO)

## 2014-10-07 LAB — GLUCOSE, CAPILLARY
GLUCOSE-CAPILLARY: 106 mg/dL — AB (ref 70–99)
GLUCOSE-CAPILLARY: 114 mg/dL — AB (ref 70–99)
GLUCOSE-CAPILLARY: 118 mg/dL — AB (ref 70–99)
GLUCOSE-CAPILLARY: 135 mg/dL — AB (ref 70–99)
Glucose-Capillary: 119 mg/dL — ABNORMAL HIGH (ref 70–99)
Glucose-Capillary: 132 mg/dL — ABNORMAL HIGH (ref 70–99)

## 2014-10-07 LAB — CBC
HCT: 25 % — ABNORMAL LOW (ref 39.0–52.0)
Hemoglobin: 8.4 g/dL — ABNORMAL LOW (ref 13.0–17.0)
MCH: 30.1 pg (ref 26.0–34.0)
MCHC: 33.6 g/dL (ref 30.0–36.0)
MCV: 89.6 fL (ref 78.0–100.0)
Platelets: 23 10*3/uL — CL (ref 150–400)
RBC: 2.79 MIL/uL — AB (ref 4.22–5.81)
RDW: 15.7 % — ABNORMAL HIGH (ref 11.5–15.5)
WBC: 6.7 10*3/uL (ref 4.0–10.5)

## 2014-10-07 LAB — PROTIME-INR
INR: 1.42 (ref 0.00–1.49)
Prothrombin Time: 17.5 seconds — ABNORMAL HIGH (ref 11.6–15.2)

## 2014-10-07 LAB — BASIC METABOLIC PANEL
ANION GAP: 8 (ref 5–15)
BUN: 76 mg/dL — AB (ref 6–20)
CHLORIDE: 106 mmol/L (ref 101–111)
CO2: 25 mmol/L (ref 22–32)
Calcium: 7.1 mg/dL — ABNORMAL LOW (ref 8.9–10.3)
Creatinine, Ser: 4.15 mg/dL — ABNORMAL HIGH (ref 0.61–1.24)
GFR calc Af Amer: 16 mL/min — ABNORMAL LOW (ref >60)
GFR calc non Af Amer: 14 mL/min — ABNORMAL LOW (ref >60)
Glucose, Bld: 136 mg/dL — ABNORMAL HIGH (ref 70–99)
POTASSIUM: 3 mmol/L — AB (ref 3.5–5.1)
SODIUM: 139 mmol/L (ref 135–145)

## 2014-10-07 LAB — HAPTOGLOBIN: Haptoglobin: 75 mg/dL (ref 34–200)

## 2014-10-07 LAB — APTT: APTT: 35 s (ref 24–37)

## 2014-10-07 LAB — PREPARE RBC (CROSSMATCH)

## 2014-10-07 MED ORDER — POTASSIUM CHLORIDE CRYS ER 20 MEQ PO TBCR
40.0000 meq | EXTENDED_RELEASE_TABLET | Freq: Once | ORAL | Status: AC
  Administered 2014-10-07: 40 meq via ORAL
  Filled 2014-10-07: qty 2

## 2014-10-07 MED ORDER — ACETAMINOPHEN 325 MG PO TABS
650.0000 mg | ORAL_TABLET | Freq: Once | ORAL | Status: AC
  Administered 2014-10-07: 650 mg via ORAL
  Filled 2014-10-07: qty 2

## 2014-10-07 MED ORDER — SODIUM CHLORIDE 0.9 % IV SOLN: Freq: Once | INTRAVENOUS | Status: AC

## 2014-10-07 MED ORDER — DIPHENHYDRAMINE HCL 25 MG PO CAPS
25.0000 mg | ORAL_CAPSULE | Freq: Once | ORAL | Status: AC
  Administered 2014-10-07: 25 mg via ORAL
  Filled 2014-10-07: qty 1

## 2014-10-07 NOTE — Care Management Utilization Note (Signed)
UR completed 

## 2014-10-07 NOTE — Progress Notes (Signed)
Patient ID: [name], male   DOB: 1951-01-18, 64 y.o.   MRN: [id number]    Hx rectal Ca Mets to liver and lung Pancytopenia Cirrhosis Ongoing chemo   Request for BM Bx per Dr Whitney Muse  Pt to be to [facility] via ambulance at 11 am RN aware Pt will return to [facility] after procedure

## 2014-10-07 NOTE — Progress Notes (Signed)
Patient ID: [name], male   DOB: 1951-03-14, 64 y.o.   MRN: [id number]    BM bx has been rescheduled to 5/11 in [place] Must be in [place] via ambulance by 9am 5/11  See orders for npo  Will return to [facility] after procedure

## 2014-10-07 NOTE — Care Management Note (Signed)
Case Management Note  Patient Details  Name: [name] MRN: [id number] Date of Birth: 06-22-1950   Expected Discharge Date:  10/08/14               Expected Discharge Plan:  [place]  In-House Referral:     Discharge planning Services  CM Consult  Post Acute Care Choice:  Home Health Choice offered to:  Patient  DME Arranged:    DME Agency:     HH Arranged:  RN [place] Agency:  [place]  Status of Service:  In process, will continue to follow  Medicare Important Message Given:    Date Medicare IM Given:    Medicare IM give by:    Date Additional Medicare IM Given:    Additional Medicare Important Message give by:     If discussed at [place] of Stay Meetings, dates discussed:  10/07/2014  Additional Comments: Communication received from [place], pt's [facility] has been arranged with [facility]. CM will f/u with [facility] rep for updates on DC.  Sherald Barge, RN 10/07/2014, 3:03 PM

## 2014-10-07 NOTE — Progress Notes (Signed)
[facility] PROGRESS NOTE  KY RUMPLE LGX:[id number] DOB: [birth year] DOA: 09/26/2014 PCP: [name], MD    Code Status: Full code Family Communication: discussed with patient and wife Disposition Plan: Discharge when clinically appropriate.   Consultants:  Urology  Nephrology  Procedures:  Cystoscopy with clot evacuation; left ureteral stent removal; bilateral retrograde pyelograms; bilateral ureteral stent placement, Dr. Tresa Moore, 09/26/14.  Antibiotics:  Cipro 4/30>>5/6  HPI/Subjective: Patient ambulated today. No complaints of shortness of breath or chest pain.  Objective: Filed Vitals:   10/07/14 1752  BP: 137/58  Pulse: 86  Temp: 98 F (36.7 C)  Resp: 20     Intake/Output Summary (Last 24 hours) at 10/07/14 2002 Last data filed at 10/07/14 1900  Gross per 24 hour  Intake   3595 ml  Output   3580 ml  Net     15 ml   Filed Weights   09/26/14 1116 09/26/14 2017 10/02/14 0420  Weight: 87.091 kg (192 lb) 85.911 kg (189 lb 6.4 oz) 95.119 kg (209 lb 11.2 oz)    Exam:   General:  Alert, sitting up in bed, in no acute distress.  Cardiovascular: S1, S2, regular  Respiratory: CTA B  Abdomen: Right-sided ileostomy with bag holding greenish bilious fluid/stool; bowel sounds active, nontender, obese.  GU: Indwelling Foley catheter noted with clear urine noted in tubing.  Musculoskeletal/extremities: 2+ pedal edema bilaterally.   Neurologic: The patient is alert and oriented 3.  Data Reviewed: Basic Metabolic Panel:  Recent Labs Lab 10/03/14 0910 10/04/14 8144 10/05/14 0617 10/06/14 0717 10/07/14 0653  NA 138 139 139 140 139  K 5.5* 4.6 3.8 2.9* 3.0*  CL 116* 113* 108 108 106  CO2 15* 18* 20* 23 25  GLUCOSE 122* 105* 118* 125* 136*  BUN 65* 73* 77* 80* 76*  CREATININE 4.80* 5.41* 5.53* 4.92* 4.15*  CALCIUM 6.6* 7.1* 7.4* 7.2* 7.1*   Liver Function Tests: No results for input(s): AST, ALT, ALKPHOS, BILITOT, PROT, ALBUMIN in  the last 168 hours. No results for input(s): LIPASE, AMYLASE in the last 168 hours. No results for input(s): AMMONIA in the last 168 hours. CBC:  Recent Labs Lab 10/03/14 1012 10/04/14 0632 10/05/14 0613 10/06/14 0717 10/07/14 0653  WBC 15.4* 9.4 9.0 6.2 6.7  HGB 5.4* 7.6* 8.5* 8.3* 8.4*  HCT 16.0* 21.6* 24.7* 24.1* 25.0*  MCV 90.4 89.3 89.2 88.6 89.6  PLT 40* 37* 34* 25* 23*   Cardiac Enzymes: No results for input(s): CKTOTAL, CKMB, CKMBINDEX, TROPONINI in the last 168 hours. BNP (last 3 results) No results for input(s): BNP in the last 8760 hours.  ProBNP (last 3 results) No results for input(s): PROBNP in the last 8760 hours.  CBG:  Recent Labs Lab 10/07/14 0031 10/07/14 0430 10/07/14 0725 10/07/14 1109 10/07/14 1647  GLUCAP 114* 119* 118* 106* 135*    No results found for this or any previous visit (from the past 240 hour(s)).   Studies: No results found.  Scheduled Meds: . sodium chloride   Intravenous Once  . acetaminophen  650 mg Oral Once  . diphenhydrAMINE  25 mg Oral Once  . feeding supplement (ENSURE ENLIVE)  237 mL Oral BID BM  . feeding supplement (PRO-STAT SUGAR FREE 64)  30 mL Oral BID  . folic acid  1 mg Oral Daily  . hydrocortisone cream  1 application Topical BID  . insulin aspart  0-9 Units Subcutaneous 6 times per day  . lidocaine  1 application Urethral Once  .  magnesium oxide  400 mg Oral Daily  . pantoprazole  40 mg Oral Daily  . sodium bicarbonate  1,300 mg Oral TID   Continuous Infusions: . 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 The patient also presented with gross hematuria which has been subacute to chronic. CT of  his abdomen and pelvis revealed severe left hydronephrosis with the left stent in place and new mild right hydronephrosis. Urologist, Dr. Tresa Moore performed cystoscopy and bilateral ureteral stent placement. Nephrologist, Dr. Lowanda Foster was consulted and started the patient on IV fluids and Lasix with adjustments. Initially the patient's renal function improved, but he subsequently developed obstruction of his Foley catheter due to multiple clots which were difficult to flush. His renal function has progressively gotten worse. Hematuria was worsened by concomitant thrombocytopenia. Patient had significant bleeding requiring multiple transfusions of PRBCs and platelets. Hematology was consulted for persistent thrombocytopenia and is planning a bone marrow biopsy 5/11. His hematuria has since improved with frequent flushings and his urine is now clear. Renal function is slowly improving. Anticipate discharge home once his renal function further improves.  Assessment and plan: Principal Problem:   Bilateral hydronephrosis Active Problems:   DM type 2 (diabetes mellitus, type 2)   Adenocarcinoma of rectum   Pancytopenia, acquired   Thrombocytopenia   Acute blood loss anemia   Gross hematuria   Hyperkalemia   Acute kidney injury   Anemia of chronic disease   Obstructive uropathy   Recurrent UTI   1. Acute kidney injury, likely from obstructive uropathy. The patient's baseline creatinine from early March 2016 was within normal limits. Over the last couple weeks, it has progressively increased. On admission, his creatinine was 3.86. He was started on vigorous IV fluids and urologist, Dr. Tresa Moore performed cystoscopy and bilateral ureteral stent placement. Nephrology was consulted and has been managing fluids/lasix dosing.   Renal ultrasound on 4/30 revealed Post right-sided double-J ureteral stent placement with minimal improvement in persistent mild right-sided pelvicaliectasis;Mild to moderate residual  left-sided pelvicaliectasis, improved since abdominal CT performed 09/26/2014. His creatinine rose to peak of 5.5 and has since started to improve. He continued on IV fluids and urine output has been excellent. We'll continue current treatments and monitor for further improvements.  Hyperkalemia; hypokalemia. Improved with kayexalate  Obstructive uropathy/hydronephrosis. The patient has a history of left hydronephrosis and had a stent placement by urology in the past. On admission, CT of his abdomen and pelvis revealed severe left hydronephrosis and new mild right hydronephrosis. Following admission, Dr. Tresa Moore performed cystoscopy, removal of the previous left stent and placement of bilateral ureteral stents. He has had follow-up renal ultrasounds which indicate persistent bilateral hydronephrosis and enlarged bladder. Seen by Dr. Jeffie Pollock on 5/6 with extensive flushing of his catheter and removal of several clots. Urine appears to be clear now. Will continue to flush catheter q2-4H  Gross hematuria/pyuria/bacteriuria-treating as UTI. Urine culture ordered and is negative to date Cipro was started empirically. He has done 7 days of cipro. Antibiotics have been discontinued. He is afebrile.  Acute and chronic blood loss anemia, superimposed on anemia of chronic disease. 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 His hemoglobin was 9.4 on 4/28 and 7.8 at the time of admission. He has been transfused a total of 10 units of packed red blood cells. Hemoglobin appears to be stable.  We'll continue to monitor his CBC   Acquired pancytopenia/thrombocytopenia. Appears to be a chronic finding, in part, from chemotherapy/radiation/chronic disease and from bleeding. His platelet count was 78 on admission and has drifted down to 18 in the setting of gross GU bleeding. His baseline platelet count appears to be 49-58. He  was transfused a total of 4 units of platelets.  Hematology consulted for persistent/worsening thrombocytopenia. They have arranged bone marrow biopsy to be done on 5/11.  Type 2 diabetes mellitus. Apparently, he is treated with diet alone now. We'll continue sliding scale NovoLog, sensitive scale. His CBGs are well controlled.  Metastatic, stage IV adenocarcinoma of the rectum. The patient is followed by oncology and radiation oncology. He is being treated with XRT and single agent chemotherapy per review of the notes and per patient history. Oncology was notified of the patient's hospitalization.  Isolated elevated blood pressure; he has a history of hypertension. On when necessary hydralazine and continue to monitor for need for oral medication.    Time spent: 43mnutes    MEMON,JEHANZEB  [facility] Pager [phone] If 7PM-7AM, please contact night-coverage at [email or web address], password [facility]5/02/2015, 8:02 PM  LOS: 11 days

## 2014-10-07 NOTE — Plan of Care (Signed)
Problem: Phase II Progression Outcomes Goal: Foley discontinued Outcome: Not Progressing Chronic use

## 2014-10-07 NOTE — Progress Notes (Signed)
Subjective: Interval History: Patient offers no complaints. Denies any difficulty breathing  Objective: Vital signs in last 24 hours: Temp:  [98 F (36.7 C)-98.5 F (36.9 C)] 98.5 F (36.9 C) (05/10 0433) Pulse Rate:  [77-86] 77 (05/10 0433) Resp:  [20] 20 (05/10 0433) BP: (116-120)/(50-60) 116/60 mmHg (05/10 0433) SpO2:  [97 %-99 %] 97 % (05/10 0433) Weight change:   Intake/Output from previous day: 05/09 0701 - 05/10 0700 In: 2068.3 [P.O.:360; I.V.:1708.3] Out: 4200 [Urine:3800; Stool:400] Intake/Output this shift: Total I/O In: 0  Out: 850 [Urine:850]  Generally patient is alert and in no apparent distress. Chest is clear to auscultation, no rales rhonchi or egophony. His heart exam revealed regular rate and rhythm no murmur no S3 Abdomen: Soft nontender Extremities trace  edema  Lab Results:  Recent Labs  10/06/14 0717 10/07/14 0653  WBC 6.2 6.7  HGB 8.3* 8.4*  HCT 24.1* 25.0*  PLT 25* 23*   BMET:   Recent Labs  10/06/14 0717 10/07/14 0653  NA 140 139  K 2.9* 3.0*  CL 108 106  CO2 23 25  GLUCOSE 125* 136*  BUN 80* 76*  CREATININE 4.92* 4.15*  CALCIUM 7.2* 7.1*   No results for input(s): PTH in the last 72 hours. Iron Studies:   Recent Labs  10/06/14 0717  IRON 20*  TIBC 151*  FERRITIN 579*    Studies/Results: No results found.  I have reviewed the patient's current medications.  Assessment/Plan: Problem #1 acute kidney injury: Obstructive. His renal function is progressively improving. Problem #2 chronic renal failure: Stage III Problem #3 history of diabetes: His blood sugar is reasonably controlled Problem #4 hypokalemia: His potassium is low but improving. Problem #5 bilateral hydronephrosis: His status post bilateral just stent placement and is hydronephrosis. Patient presently on a larger dose of Lasix and his urine output has improved. Patient had 3800 mL of urine output. Problem #6 anemia: His hemoglobin is low but stable. Iron  studies are pending. Problem #7 history of UTI : Patient is presently afebrile and his white blood cell count also has come to a normal level. Problem #8. Low CO2 possibly metabolic. Patient presently on sodium bicarbonate. His CO2 has corrected. Plan: 1] We'll continue with IV fluid containing potassium. 2] we'll DC Lasix 3] We'll check his basic metabolic panel in the morning. 4 ]we will  Give him  KCl 40 mEq po 1 dose.    LOS: 11 days   Oasis Goehring S 10/07/2014,10:03 AM

## 2014-10-08 ENCOUNTER — Ambulatory Visit (HOSPITAL_COMMUNITY)
Admit: 2014-10-08 | Discharge: 2014-10-08 | Disposition: A | Discharge status: Home / Self Care | Payer: Managed Care, Other (non HMO) | Attending: Hematology & Oncology | Admitting: Hematology & Oncology

## 2014-10-08 ENCOUNTER — Encounter (HOSPITAL_COMMUNITY): Payer: Self-pay

## 2014-10-08 ENCOUNTER — Ambulatory Visit (HOSPITAL_COMMUNITY)
Admit: 2014-10-08 | Discharge: 2014-10-08 | Disposition: A | Discharge status: Home / Self Care | Payer: Managed Care, Other (non HMO) | Attending: Oncology | Admitting: Oncology

## 2014-10-08 VITALS — BP 119/55 | HR 89 | Temp 98.0°F | Resp 18 | Ht 69.0 in | Wt 209.0 lb

## 2014-10-08 DIAGNOSIS — D61818 Other pancytopenia: Secondary | ICD-10-CM | POA: Insufficient documentation

## 2014-10-08 DIAGNOSIS — N179 Acute kidney failure, unspecified: Secondary | ICD-10-CM | POA: Insufficient documentation

## 2014-10-08 LAB — GLUCOSE, CAPILLARY
GLUCOSE-CAPILLARY: 104 mg/dL — AB (ref 70–99)
GLUCOSE-CAPILLARY: 139 mg/dL — AB (ref 70–99)
GLUCOSE-CAPILLARY: 106 mg/dL — AB (ref 70–99)
GLUCOSE-CAPILLARY: 129 mg/dL — AB (ref 70–99)
Glucose-Capillary: 105 mg/dL — ABNORMAL HIGH (ref 70–99)
Glucose-Capillary: 123 mg/dL — ABNORMAL HIGH (ref 70–99)

## 2014-10-08 LAB — CBC
HCT: 29.7 % — ABNORMAL LOW (ref 39.0–52.0)
HEMOGLOBIN: 10 g/dL — AB (ref 13.0–17.0)
MCH: 29.8 pg (ref 26.0–34.0)
MCHC: 33.7 g/dL (ref 30.0–36.0)
MCV: 88.4 fL (ref 78.0–100.0)
Platelets: 22 10*3/uL — CL (ref 150–400)
RBC: 3.36 MIL/uL — ABNORMAL LOW (ref 4.22–5.81)
RDW: 16.6 % — ABNORMAL HIGH (ref 11.5–15.5)
WBC: 6.6 10*3/uL (ref 4.0–10.5)

## 2014-10-08 LAB — BASIC METABOLIC PANEL
ANION GAP: 7 (ref 5–15)
BUN: 70 mg/dL — ABNORMAL HIGH (ref 6–20)
CALCIUM: 7.6 mg/dL — AB (ref 8.9–10.3)
CO2: 26 mmol/L (ref 22–32)
Chloride: 108 mmol/L (ref 101–111)
Creatinine, Ser: 3.61 mg/dL — ABNORMAL HIGH (ref 0.61–1.24)
GFR calc Af Amer: 19 mL/min — ABNORMAL LOW (ref >60)
GFR, EST NON AFRICAN AMERICAN: 17 mL/min — AB (ref >60)
GLUCOSE: 106 mg/dL — AB (ref 70–99)
Potassium: 3.6 mmol/L (ref 3.5–5.1)
SODIUM: 141 mmol/L (ref 135–145)

## 2014-10-08 LAB — BONE MARROW EXAM

## 2014-10-08 MED ORDER — FENTANYL CITRATE (PF) 100 MCG/2ML IJ SOLN
INTRAMUSCULAR | Status: AC
  Filled 2014-10-08: qty 4

## 2014-10-08 MED ORDER — MORPHINE SULFATE 2 MG/ML IJ SOLN: 2.0000 mg | INTRAMUSCULAR | Status: DC | PRN

## 2014-10-08 MED ORDER — HEPARIN SOD (PORK) LOCK FLUSH 100 UNIT/ML IV SOLN
500.0000 [IU] | Freq: Once | INTRAVENOUS | Status: AC
  Administered 2014-10-08: 500 [IU] via INTRAVENOUS
  Filled 2014-10-08 (×2): qty 5

## 2014-10-08 MED ORDER — FENTANYL CITRATE (PF) 100 MCG/2ML IJ SOLN
INTRAMUSCULAR | Status: AC | PRN
  Administered 2014-10-08 (×2): 25 ug via INTRAVENOUS

## 2014-10-08 MED ORDER — MIDAZOLAM HCL 2 MG/2ML IJ SOLN
INTRAMUSCULAR | Status: AC | PRN
  Administered 2014-10-08 (×2): 0.5 mg via INTRAVENOUS

## 2014-10-08 MED ORDER — MIDAZOLAM HCL 2 MG/2ML IJ SOLN
INTRAMUSCULAR | Status: AC
  Filled 2014-10-08: qty 6

## 2014-10-08 NOTE — Progress Notes (Signed)
Pt received from IR with opsite over skin tear, reported by becca hunt rn as occuring as pt was moving to procedure table.  Per kevin allred pa, may heparin lock port for transport to [place].  Report called to [employer] at [employer]

## 2014-10-08 NOTE — Care Management Utilization Note (Signed)
UR completed 

## 2014-10-08 NOTE — Procedures (Signed)
Interventional Radiology Procedure Note  Procedure: CT guided aspirate and core biopsy of right iliac bone Complications: None Recommendations: - Bedrest supine x 2 hrs - Hydrocodone PRN  Pain - Follow biopsy results  Signed,  Criselda Peaches, MD

## 2014-10-08 NOTE — Discharge Instructions (Signed)
Do not drive  For 24 hours Do not go into public places today May resume your regular diet and take home medications as usual May experience small amount of tingling in leg (biopsy side) May take shower and remove bandage in am For any questions or concerns, call dr If bleeding occurs at site, hold pressure x10 minutes  If continues, call doctorBone Marrow Aspiration, Bone Marrow Biopsy Care After Read the instructions outlined below and refer to this sheet in the next few weeks. These discharge instructions provide you with general information on caring for yourself after you leave the hospital. Your caregiver may also give you specific instructions. While your treatment has been planned according to the most current medical practices available, unavoidable complications occasionally occur. If you have any problems or questions after discharge, call your caregiver. FINDING OUT THE RESULTS OF YOUR TEST Not all test results are available during your visit. If your test results are not back during the visit, make an appointment with your caregiver to find out the results. Do not assume everything is normal if you have not heard from your caregiver or the medical facility. It is important for you to follow up on all of your test results.  HOME CARE INSTRUCTIONS  You have had sedation and may be sleepy or dizzy. Your thinking may not be as clear as usual. For the next 24 hours:  Only take over-the-counter or prescription medicines for pain, discomfort, and or fever as directed by your caregiver.  Do not drink alcohol.  Do not smoke.  Do not drive.  Do not make important legal decisions.  Do not operate heavy machinery.  Do not care for small children by yourself.  Keep your dressing clean and dry. You may replace dressing with a bandage after 24 hours.  You may take a bath or shower after 24 hours.  Use an ice pack for 20 minutes every 2 hours while awake for pain as needed. SEEK MEDICAL  CARE IF:   There is redness, swelling, or increasing pain at the biopsy site.  There is pus coming from the biopsy site.  There is drainage from a biopsy site lasting longer than one day.  An unexplained oral temperature above 102 F (38.9 C) develops. SEEK IMMEDIATE MEDICAL CARE IF:   You develop a rash.  You have difficulty breathing.  You develop any reaction or side effects to medications given. Document Released: 12/03/2004 Document Revised: 08/08/2011 Document Reviewed: 05/13/2008 [facility] Patient Information 2015 [place], [place]. This information is not intended to replace advice given to you by your health care provider. Make sure you discuss any questions you have with your health care provider.

## 2014-10-08 NOTE — Progress Notes (Signed)
[facility] PROGRESS NOTE  [name] INO:[id number] DOB: 08-05-1950    PCP:   [name], MD   HPI: [name] is an 64 y.o. male with hx of metastatic adenocarcinoma of the rectum, hx of hydronephrosis, s/p L ureteral stent, thrombocytopenia, hematuria, pancytopenia, admitted for AKI due to obtructive uropathy, s/p bilateral stent placemnet with improvement of his Cr, s/p BM biopsy today, having hematuria again. He has some pain on his BM bx site but not severe.   Rewiew of Systems: Negative.   Past Medical History  Diagnosis Date  . Pancytopenia, acquired 12/04/2010  . B12 deficiency 12/07/2010  . Kidney stones   . CAD (coronary artery disease)   . Anemia   . History of radiation therapy 06/19/07 -07/27/07    whole pelvis  . Hx of radiation therapy 08/06/12,08/09/12,& 08/13/12    rul 54GY/30f  . Cancer     Colon ca dx 2008 surg/rad/chemo  . Lung cancer   . FH: chemotherapy   . Rectal bleeding 09/20/2013    From recurrent rectal mass/recurrent rectal adenocarcinoma  . Acute blood loss anemia 09/27/2013  . 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  2.7 cm primary, moderately differentiated with 3/8 positive lymph nodes without LVI.  Surgery was on 04/22/2007 and was treated postoperatively with radiation and concomitant capecitabine and then 4 cycles of a planned 6 with oxaliplatin and capecitabine.  His counts would not allow treatment of  . Hydronephrosis of left kidney 09/24/2013    Kidney stones; s/p cystoscopy and stent  . Gross hematuria 09/25/2103  . Lung metastases 07/09/2013  . Secondary malignancy of right lung 07/09/2013  . Insomnia 06/09/2014  . Foley catheter in place 07/02/14  . Broken collarbone feb 2016  . Myocardial infarction     2003   . Heart murmur   . Sleep apnea     hx of cpap no longer wears   . Diabetes mellitus without complication     hx of  no longer on meds   . Hypertension     hx of   . Shortness of breath dyspnea     due to radiaiton treatment   . GERD (gastroesophageal reflux disease)   . Bilateral hydronephrosis 09/27/2014    Past Surgical History  Procedure Laterality Date  . Ileostomy  12/07/2010    Procedure: ILEOSTOMY;  Surgeon: BDonato Heinz  Location: [facility];  Service: General;  Laterality: N/A;  Diverting Ileostomy Lysis of adhesions Exploratory Laparotomy  . Lung biopsy  10/27/10    rt lobe- adenocarcinoma  . Cholecystectomy    . Cataract extraction w/ intraocular lens implant  2007    bil  . Lithotripsy  2002  . Coronary angioplasty with stent placement  2003    stenting x 2  . Colon resection  2008    x2  . Esophagogastroduodenoscopy N/A 05/02/2013    Dr. RGala Romney polypoid antral fold, bx= reactive gastropathy  . Givens capsule study N/A 05/02/2013    Procedure: GIVENS CAPSULE STUDY;  Surgeon: RDaneil Dolin MD;  Location: [facility];  Service: Endoscopy;  Laterality: N/A;  . Colonoscopy N/A 09/20/2013    SLF:A near circumferential fungating mass with friable surfaces was found in the rectum.  ACTIVELY OOZING.  CLOTS SEEN IN LUMEN AND ASPIRATED.  Multiple biopsies were performed using cold forceps.  Thermal therapy(BICAP 7 Fr 25W)  was used to ATTEMPT TO control bleeding.    HEMOSTASIS NOT ACHEIVED.  Marland Kitchen Cystoscopy w/ ureteral stent placement Left 09/24/2013    Procedure: CYSTOSCOPY WITH LEFT RETROGRADE PYELOGRAM; LEFT URETERAL STENT PLACEMENT; REMOVAL OF SMALL BLADDER CALCULI;  Surgeon: Marissa Nestle, MD;  Location: [facility];  Service: Urology;  Laterality: Left;  . Flexible sigmoidoscopy N/A 09/25/2013    TDS:[id number] exophytic rectal mass most likely representing recurrent adenocarcinoma status post biopsy  . Cystoscopy with retrograde pyelogram, ureteroscopy and stent placement Left 05/08/2014    Procedure: CYSTOSCOPY/ EVACUATION OF CLOTS  WITH LEFT RETROGRADE PYELOGRAM,LEFT URETEROSCOPY ;  Surgeon:  Malka So, MD;  Location: [facility];  Service: Urology;  Laterality: Left;  . Nephrolithotomy Left 05/13/2014    Procedure: LEFT NEPHROLITHOTOMY PERCUTANEOUS FIRST STAGE;  Surgeon: Malka So, MD;  Location: [facility];  Service: Urology;  Laterality: Left;  . Holmium laser application Left 26/20/3559    Procedure: HOLMIUM LASER APPLICATION;  Surgeon: Malka So, MD;  Location: [facility];  Service: Urology;  Laterality: Left;  . Cystoscopy w/ ureteral stent placement Bilateral 09/26/2014    Procedure: CYSTOSCOPY WITH CLOT EVACUATION; LEFT URETERAL STENT REMOVAL; BILATERAL RETROGRADE PYELOGRAM; BILATERAL URETERAL STENT PLACEMENT;  Surgeon: Alexis Frock, MD;  Location: [facility];  Service: Urology;  Laterality: Bilateral;    Medications:  HOME MEDS: Prior to Admission medications   Medication Sig Start Date End Date Taking? Authorizing Provider  ciprofloxacin (CIPRO) 250 MG tablet Take 250 mg by mouth 2 (two) times daily.   Yes Historical Provider, MD  cyanocobalamin (,VITAMIN B-12,) 1000 MCG/ML injection Inject 1,000 mcg into the muscle every 30 (thirty) days.    Yes Historical Provider, MD  folic acid (FOLVITE) 1 MG tablet Take 1 tablet (1 mg total) by mouth daily. 04/28/14  Yes Baird Cancer, PA-C  HYDROcodone-acetaminophen (NORCO) 7.5-325 MG per tablet Take 1 tablet by mouth every 6 (six) hours as needed for moderate pain. 09/15/14  Yes Baird Cancer, PA-C  hydrocortisone cream 1 % Apply 1 application topically 2 (two) times daily. Mix with urea cream and apply to affected areas. 04/23/14  Yes Baird Cancer, PA-C  lidocaine-prilocaine (EMLA) cream Apply a quarter size amount to port site 1 hour prior to chemo. Do not rub in. Cover with plastic wrap. 03/13/14  Yes Manon Hilding Kefalas, PA-C  magnesium oxide (MAG-OX) 400 (241.3 MG) MG tablet Take 1 tablet (400 mg total) by mouth daily. Patient taking differently: Take 400 mg by mouth daily. Patient takes at [facility] 09/08/14  Yes Patrici Ranks, MD   omeprazole (PRILOSEC) 20 MG capsule Take 1 capsule (20 mg total) by mouth daily. 09/28/13  Yes Rexene Alberts, MD  ondansetron (ZOFRAN) 8 MG tablet Take 1 tablet (8 mg total) by mouth 2 (two) times daily as needed for nausea or vomiting. 03/13/14  Yes Manon Hilding Kefalas, PA-C  temazepam (RESTORIL) 15 MG capsule Take 1-2 capsules (15-30 mg total) by mouth at bedtime as needed for sleep. 06/09/14  Yes Manon Hilding Kefalas, PA-C  urea (CARMOL) 10 % cream Apply topically as needed. Mix with hydrocortisone cream and apply to affected areas. 04/23/14  Yes Baird Cancer, PA-C     Allergies:  Allergies  Allergen Reactions  . Daypro [Oxaprozin] Nausea And Vomiting  . Xeloda [Capecitabine] Other (See Comments)    Homocidal and suicidal ideations    Social History:   reports that he has quit smoking. His smoking use included Cigarettes and Cigars. He has  a 60 pack-year smoking history. He quit smokeless tobacco use about 9 years ago. His smokeless tobacco use included Chew. He reports that he does not drink alcohol or use illicit drugs.  Family History: Family History  Problem Relation Age of Onset  . Cervical cancer Mother   . Rectal cancer Father 26  . Uterine cancer Sister      Physical Exam: Filed Vitals:   10/08/14 0040 10/08/14 0300 10/08/14 0555 10/08/14 1612  BP: 116/58 122/60 124/58 131/67  Pulse: 82 78 82 85  Temp: 98.5 F (36.9 C) 97.6 F (36.4 C) 98.7 F (37.1 C) 98.3 F (36.8 C)  TempSrc: Oral Oral Oral Oral  Resp: 20 20 20 18   Height:      Weight:      SpO2: 97% 99% 97% 97%   Blood pressure 131/67, pulse 85, temperature 98.3 F (36.8 C), temperature source Oral, resp. rate 18, height 5' 9"  (1.753 m), weight 95.119 kg (209 lb 11.2 oz), SpO2 97 %.  GEN:  Pleasant  patient lying in the stretcher in no acute distress; cooperative with exam. PSYCH:  alert and oriented x4; does not appear anxious or depressed; affect is appropriate. HEENT: Mucous membranes pink and anicteric;  PERRLA; EOM intact; no cervical lymphadenopathy nor thyromegaly or carotid bruit; no JVD; There were no stridor. Neck is very supple. Breasts:: Not examined CHEST WALL: No tenderness CHEST: Normal respiration, clear to auscultation bilaterally.  HEART: Regular rate and rhythm.  There are no murmur, rub, or gallops.   BACK: No kyphosis or scoliosis; no CVA tenderness ABDOMEN: soft and non-tender; no masses, no organomegaly, normal abdominal bowel sounds; no pannus; no intertriginous candida. There is no rebound and no distention. Rectal Exam: Not done EXTREMITIES: No bone or joint deformity; age-appropriate arthropathy of the hands and knees; no edema; no ulcerations.  There is no calf tenderness. Genitalia: not examined PULSES: 2+ and symmetric SKIN: Normal hydration no rash or ulceration CNS: Cranial nerves 2-12 grossly intact no focal lateralizing neurologic deficit.  Speech is fluent; uvula elevated with phonation, facial symmetry and tongue midline. DTR are normal bilaterally, cerebella exam is intact, barbinski is negative and strengths are equaled bilaterally.  No sensory loss.   Labs on Admission:  Basic Metabolic Panel:  Recent Labs Lab 10/04/14 0632 10/05/14 0617 10/06/14 0717 10/07/14 0653 10/08/14 0605  NA 139 139 140 139 141  K 4.6 3.8 2.9* 3.0* 3.6  CL 113* 108 108 106 108  CO2 18* 20* 23 25 26   GLUCOSE 105* 118* 125* 136* 106*  BUN 73* 77* 80* 76* 70*  CREATININE 5.41* 5.53* 4.92* 4.15* 3.61*  CALCIUM 7.1* 7.4* 7.2* 7.1* 7.6*  CBC:  Recent Labs Lab 10/04/14 0632 10/05/14 0613 10/06/14 0717 10/07/14 0653 10/08/14 0605  WBC 9.4 9.0 6.2 6.7 6.6  HGB 7.6* 8.5* 8.3* 8.4* 10.0*  HCT 21.6* 24.7* 24.1* 25.0* 29.7*  MCV 89.3 89.2 88.6 89.6 88.4  PLT 37* 34* 25* 23* 22*   CBG:  Recent Labs Lab 10/08/14 0011 10/08/14 0339 10/08/14 0741 10/08/14 1255 10/08/14 1607  GLUCAP 123* 104* 105* 139* 106*    Assessment/Plan Present on Admission:  . Acute kidney  injury . (Resolved) Hydronephrosis of left kidney . Thrombocytopenia . Adenocarcinoma of rectum . Anemia of chronic disease . Bilateral hydronephrosis . Obstructive uropathy . Gross hematuria . Acute blood loss anemia . Recurrent UTI . Pancytopenia, acquired . Hyperkalemia  PLAN:  1. Acute kidney injury, likely from obstructive uropathy. The patient's baseline creatinine  from early March 2016 was within normal limits. Over the last couple weeks, it has progressively increased. On admission, his creatinine was 3.86. He was started on vigorous IV fluids and urologist, Dr. Tresa Moore performed cystoscopy and bilateral ureteral stent placement. Nephrology was consulted and has been managing fluids/lasix dosing.  Renal ultrasound on 4/30 revealed Post right-sided double-J ureteral stent placement with minimal improvement in persistent mild right-sided pelvicaliectasis;Mild to moderate residual left-sided pelvicaliectasis, improved since abdominal CT performed 09/26/2014. His creatinine rose to peak of 5.5 and has since started to improve. He continued on IV fluids and urine output has been excellent. We'll continue current treatments and monitor for further improvements. Hopefully it will improved to about 1.5 to 2.0 which I feel is his baseline.   Hyperkalemia; hypokalemia. Improved with kayexalate  Obstructive uropathy/hydronephrosis. The patient has a history of left hydronephrosis and had a stent placement by urology in the past. On admission, CT of his abdomen and pelvis revealed severe left hydronephrosis and new mild right hydronephrosis. Following admission, Dr. Tresa Moore performed cystoscopy, removal of the previous left stent and placement of bilateral ureteral stents. He has had follow-up renal ultrasounds which indicate persistent bilateral hydronephrosis and enlarged bladder. Seen by Dr. Jeffie Pollock on 5/6 with extensive flushing of his catheter and removal of several clots. He has hematuria  again, and will require flushing of his bladder.   Gross hematuria/pyuria/bacteriuria-treating as UTI. Urine culture ordered and is negative to date Cipro was started empirically. He has done 7 days of cipro. Antibiotics have been discontinued. He is afebrile.  Acute and chronic blood loss anemia, superimposed on anemia of chronic disease. 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 His hemoglobin was 9.4 on 4/28 and 7.8 at the time of admission. He has been transfused a total of 10 units of packed red blood cells. Hemoglobin appears to be stable.  We'll continue to monitor his CBC   Acquired pancytopenia/thrombocytopenia. Appears to be a chronic finding, in part, from chemotherapy/radiation/chronic disease and from bleeding. His platelet count was 78 on admission and has drifted down to 18 in the setting of gross GU bleeding. His baseline platelet count appears to be 49-58. He was transfused a total of 4 units of platelets.  Hematology consulted for persistent/worsening thrombocytopenia. They have arranged bone marrow biopsy to be done on 5/11.  Type 2 diabetes mellitus. Apparently, he is treated with diet alone now. We'll continue sliding scale NovoLog, sensitive scale. His CBGs are well controlled.  Metastatic, stage IV adenocarcinoma of the rectum. The patient is followed by oncology and radiation oncology. He is being treated with XRT and single agent chemotherapy per review of the notes and per patient history. Oncology was notified of the patient's hospitalization.  Isolated elevated blood pressure; he has a history of hypertension. On when necessary hydralazine and continue to monitor for need for oral medication.   Other plans as per orders.  Code Status: FULL Haskel Khan, MD. [facility] Pager [phone] 7pm to 7am.  10/08/2014, 4:40 PM

## 2014-10-08 NOTE — Progress Notes (Signed)
[name]  MRN: [id number]  DOB/AGE: [birth year] 64 y.o.  Primary Care Physician:[name], Betsy Coder, MD  Admit date: 09/26/2014  Chief Complaint:  Chief Complaint  Patient presents with  . Abnormal Lab    S-Pt presented on  09/26/2014 with  Chief Complaint  Patient presents with  . Abnormal Lab  .    Pt today feels better ,offers no new concerns.   Med . sodium chloride   Intravenous Once  . feeding supplement (ENSURE ENLIVE)  237 mL Oral BID BM  . feeding supplement (PRO-STAT SUGAR FREE 64)  30 mL Oral BID  . folic acid  1 mg Oral Daily  . hydrocortisone cream  1 application Topical BID  . insulin aspart  0-9 Units Subcutaneous 6 times per day  . lidocaine  1 application Urethral Once  . magnesium oxide  400 mg Oral Daily  . pantoprazole  40 mg Oral Daily  . sodium bicarbonate  1,300 mg Oral TID     Physical Exam: Vital signs in last 24 hours: Temp:  [97.6 F (36.4 C)-99.2 F (37.3 C)] 98.7 F (37.1 C) (05/11 0555) Pulse Rate:  [78-87] 82 (05/11 0555) Resp:  [20] 20 (05/11 0555) BP: (116-137)/(55-66) 124/58 mmHg (05/11 0555) SpO2:  [97 %-100 %] 97 % (05/11 0555) Weight change:  Last BM Date: 10/06/14  Intake/Output from previous day: 05/10 0701 - 05/11 0700 In: 3659.2 [P.O.:720; I.V.:2079.2; Blood:670] Out: 4610 [Urine:3690; Stool:920]     Physical Exam: General- pt is awake,alert, oriented to time place and person Resp- No acute REsp distress, CTA B/L NO Rhonchi CVS- S1S2 regular in rate and rhythm GIT- BS+, soft, NT, ND EXT- trace LE Edema, NO Cyanosis   Lab Results: CBC  Recent Labs  10/07/14 0653 10/08/14 0605  WBC 6.7 6.6  HGB 8.4* 10.0*  HCT 25.0* 29.7*  PLT 23* 22*    BMET  Recent Labs  10/07/14 0653 10/08/14 0605  NA 139 141  K 3.0* 3.6  CL 106 108  CO2 25 26  GLUCOSE 136* 106*  BUN 76* 70*  CREATININE 4.15* 3.61*  CALCIUM 7.1* 7.6*   Creat Trend                    Stent placed     Bladder clots    Foley  irrigated 2016   4.28=>3.39=>2.63    =>5.5=>              4.15         ==> 3.61 2015   1.8--18.4 ( AKI) 2014  1.5--2.78 ( AKI) 2012  1.3--5.4 ( 3 episodes of AKI ) 2008 0.9--3.76 ( AKI)   MICRO No results found for this or any previous visit (from the past 240 hour(s)).    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  5)Urology admitted with b/l Hydronephrosis S/sp B/L stent Blood clots in bladder Urology and  Primary MD following  6)Electrolytes Hypookalemic NOrmonatremic   7)Acid base Co2 at goal On PO Bicarb    Plan:  Will continue current tx     [name],[name] 10/08/2014, 8:55 AM

## 2014-10-08 NOTE — Progress Notes (Signed)
When irrigating urinary catheter this time, there were a few small clots present and one large clot present and removed. Urine is now flowing through catheter. Will continue to monitor pt.

## 2014-10-08 NOTE — Progress Notes (Signed)
Patient ID: [name], male   DOB: 1950/09/21, 64 y.o.   MRN: [id number]    Referring Physician(s): Robynn Pane S  Subjective: [name] presents to [facility] today for CT-guided bone marrow biopsy. He has a history of pancytopenia in the setting of cirrhosis/splenomegaly/esophageal varices/metastatic colon cancer and acute on chronic renal failure.   Allergies: Daypro and Xeloda  Medications: Prior to Admission medications   Medication Sig Start Date End Date Taking? Authorizing Provider  folic acid (FOLVITE) 1 MG tablet Take 1 tablet (1 mg total) by mouth daily. 04/28/14  Yes Baird Cancer, PA-C  hydrocortisone cream 1 % Apply 1 application topically 2 (two) times daily. Mix with urea cream and apply to affected areas. 04/23/14  Yes Manon Hilding Kefalas, PA-C  magnesium oxide (MAG-OX) 400 (241.3 MG) MG tablet Take 1 tablet (400 mg total) by mouth daily. Patient taking differently: Take 400 mg by mouth daily. Patient takes at [facility] 09/08/14  Yes Patrici Ranks, MD  ciprofloxacin (CIPRO) 250 MG tablet Take 250 mg by mouth 2 (two) times daily.    Historical Provider, MD  cyanocobalamin (,VITAMIN B-12,) 1000 MCG/ML injection Inject 1,000 mcg into the muscle every 30 (thirty) days.     Historical Provider, MD  HYDROcodone-acetaminophen (NORCO) 7.5-325 MG per tablet Take 1 tablet by mouth every 6 (six) hours as needed for moderate pain. 09/15/14   Baird Cancer, PA-C  lidocaine-prilocaine (EMLA) cream Apply a quarter size amount to port site 1 hour prior to chemo. Do not rub in. Cover with plastic wrap. 03/13/14   Baird Cancer, PA-C  omeprazole (PRILOSEC) 20 MG capsule Take 1 capsule (20 mg total) by mouth daily. 09/28/13   Rexene Alberts, MD  ondansetron (ZOFRAN) 8 MG tablet Take 1 tablet (8 mg total) by mouth 2 (two) times daily as needed for nausea or vomiting. 03/13/14   Baird Cancer, PA-C  temazepam (RESTORIL) 15 MG capsule Take 1-2 capsules (15-30 mg total) by  mouth at bedtime as needed for sleep. 06/09/14   Baird Cancer, PA-C  urea (CARMOL) 10 % cream Apply topically as needed. Mix with hydrocortisone cream and apply to affected areas. 04/23/14   Baird Cancer, PA-C     Vital Signs: BP 122/47 mmHg  Pulse 91  Temp(Src) 97.3 F (36.3 C) (Oral)  Resp 18  Ht 5' 9"  (1.753 m)  Wt 209 lb (94.802 kg)  BMI 30.85 kg/m2  SpO2 96%  Physical Exam patient awake, alert ; chest -slight decrease breath sounds right base, left clear. Heart regular rate and rhythm. Abdomen soft, intact right lower quadrant ostomy, positive bowel sounds, nontender; extremities with 2-3+ edema bilaterally   Imaging: No results found.  Labs:  CBC:  Recent Labs  10/05/14 0613 10/06/14 0717 10/07/14 0653 10/08/14 0605  WBC 9.0 6.2 6.7 6.6  HGB 8.5* 8.3* 8.4* 10.0*  HCT 24.7* 24.1* 25.0* 29.7*  PLT 34* 25* 23* 22*    COAGS:  Recent Labs  05/12/14 0310 05/13/14 0515 06/13/14 1210 09/29/14 0739 10/07/14 0758  INR 1.10 1.06 1.15 1.24 1.42  APTT 25  --  35  --  35    BMP:  Recent Labs  10/05/14 0617 10/06/14 0717 10/07/14 0653 10/08/14 0605  NA 139 140 139 141  K 3.8 2.9* 3.0* 3.6  CL 108 108 106 108  CO2 20* 23 25 26   GLUCOSE 118* 125* 136* 106*  BUN 77* 80* 76* 70*  CALCIUM 7.4* 7.2* 7.1* 7.6*  CREATININE 5.53* 4.92* 4.15* 3.61*  GFRNONAA 10* 12* 14* 17*  GFRAA 12* 13* 16* 19*    LIVER FUNCTION TESTS:  Recent Labs  09/15/14 1122 09/25/14 0840 09/26/14 1230 09/27/14 0620  BILITOT 1.7* 2.3* 1.5* 1.5*  AST 26 53* 49* 50*  ALT 18 47 45 46  ALKPHOS 100 210* 179* 172*  PROT 5.7* 7.0 6.1 5.9*  ALBUMIN 2.6* 2.8* 2.3* 2.2*    Assessment and Plan: Patient with history of pancytopenia in the setting of cirrhosis, splenomegaly, esophageal varices, metastatic colon cancer and acute on chronic renal failure. Request has been received for CT guided bone marrow biopsy for further evaluation. Details/risks of procedure, including but not  limited to internal bleeding, infection, discussed with patient with his understanding and consent.   Signed: Autumn Messing 10/08/2014, 10:58 AM   I spent a total of 15 minutes in face to face in clinical consultation/evaluation, greater than 50% of which was counseling/coordinating care for CT-guided bone marrow biopsy

## 2014-10-09 LAB — BASIC METABOLIC PANEL
Anion gap: 6 (ref 5–15)
BUN: 69 mg/dL — ABNORMAL HIGH (ref 6–20)
CHLORIDE: 108 mmol/L (ref 101–111)
CO2: 26 mmol/L (ref 22–32)
Calcium: 7.7 mg/dL — ABNORMAL LOW (ref 8.9–10.3)
Creatinine, Ser: 3.23 mg/dL — ABNORMAL HIGH (ref 0.61–1.24)
GFR calc Af Amer: 22 mL/min — ABNORMAL LOW (ref >60)
GFR calc non Af Amer: 19 mL/min — ABNORMAL LOW (ref >60)
Glucose, Bld: 116 mg/dL — ABNORMAL HIGH (ref 65–99)
POTASSIUM: 4 mmol/L (ref 3.5–5.1)
SODIUM: 140 mmol/L (ref 135–145)

## 2014-10-09 LAB — TYPE AND SCREEN
ABO/RH(D): A POS
Antibody Screen: NEGATIVE
Unit division: 0
Unit division: 0

## 2014-10-09 LAB — GLUCOSE, CAPILLARY
GLUCOSE-CAPILLARY: 108 mg/dL — AB (ref 65–99)
GLUCOSE-CAPILLARY: 108 mg/dL — AB (ref 65–99)
GLUCOSE-CAPILLARY: 127 mg/dL — AB (ref 65–99)
GLUCOSE-CAPILLARY: 131 mg/dL — AB (ref 65–99)
Glucose-Capillary: 116 mg/dL — ABNORMAL HIGH (ref 65–99)
Glucose-Capillary: 124 mg/dL — ABNORMAL HIGH (ref 65–99)

## 2014-10-09 MED ORDER — FUROSEMIDE 10 MG/ML IJ SOLN
40.0000 mg | Freq: Once | INTRAMUSCULAR | Status: AC
  Administered 2014-10-09: 40 mg via INTRAVENOUS
  Filled 2014-10-09: qty 4

## 2014-10-09 NOTE — Progress Notes (Signed)
[name]  MRN: [id number]  DOB/AGE: March 29, 1951 64 y.o.  Primary Care Physician:[name], Betsy Coder, MD  Admit date: 09/26/2014  Chief Complaint:  Chief Complaint  Patient presents with  . Abnormal Lab    S-Pt presented on  09/26/2014 with  Chief Complaint  Patient presents with  . Abnormal Lab  .    Pt today feels better ,offers no new concerns.    Pt had BM biopsy yesterday,ptr says he ad pain last night and is better now.  Med . feeding supplement (ENSURE ENLIVE)  237 mL Oral BID BM  . feeding supplement (PRO-STAT SUGAR FREE 64)  30 mL Oral BID  . folic acid  1 mg Oral Daily  . hydrocortisone cream  1 application Topical BID  . insulin aspart  0-9 Units Subcutaneous 6 times per day  . lidocaine  1 application Urethral Once  . magnesium oxide  400 mg Oral Daily  . pantoprazole  40 mg Oral Daily  . sodium bicarbonate  1,300 mg Oral TID     Physical Exam: Vital signs in last 24 hours: Temp:  [97.3 F (36.3 C)-98.3 F (36.8 C)] 97.6 F (36.4 C) (05/12 0558) Pulse Rate:  [85-104] 92 (05/12 0558) Resp:  [17-22] 18 (05/12 0558) BP: (111-140)/(47-70) 125/59 mmHg (05/12 0558) SpO2:  [96 %-100 %] 99 % (05/12 0558) Weight:  [209 lb (94.802 kg)] 209 lb (94.802 kg) (05/11 0915) Weight change:  Last BM Date: 10/06/14  Intake/Output from previous day: 05/11 0701 - 05/12 0700 In: 2512.1 [P.O.:560; I.V.:1952.1] Out: 1850 [Urine:1450; Stool:400]     Physical Exam: General- pt is awake,alert, oriented to time place and person Resp- No acute REsp distress, NO Rhonchi CVS- S1S2 regular in rate and rhythm GIT- BS+, soft, NT, ND EXT- 1+ LE Edema, NO Cyanosis GU- Foley cath in situ, hematuria present.  Lab Results: CBC  Recent Labs  10/07/14 0653 10/08/14 0605  WBC 6.7 6.6  HGB 8.4* 10.0*  HCT 25.0* 29.7*  PLT 23* 22*    BMET  Recent Labs  10/08/14 0605 10/09/14 0606  NA 141 140  K 3.6 4.0  CL 108 108  CO2 26 26  GLUCOSE 106* 116*  BUN 70* 69*   CREATININE 3.61* 3.23*  CALCIUM 7.6* 7.7*   Creat Trend                    Stent placed     Bladder clots    Foley irrigated 2016   4.28=>3.39=>2.63    =>5.5=>              4.15         ==> 3.61=>3.23 2015   1.8--18.4 ( AKI) 2014  1.5--2.78 ( AKI) 2012  1.3--5.4 ( 3 episodes of AKI ) 2008 0.9--3.76 ( AKI)   MICRO No results found for this or any previous visit (from the past 240 hour(s)).    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  5)Urology admitted with b/l Hydronephrosis S/sp B/L stent Blood clots in bladder Urology and  Primary MD following  6)Electrolytes Hypokalemic     Now better NOrmonatremic   7)Acid base Co2 at goal On PO Bicarb    Plan:  Will reduce IVF rate Will give lasix as increasing Le dema Will follow Bmet.     Adasyn Mcadams S 10/09/2014, 8:52 AM

## 2014-10-09 NOTE — Progress Notes (Signed)
[facility] PROGRESS NOTE  BENIAH MAGNAN SEG:[id number] DOB: 03-Feb-1951    PCP:   [name], MD   HPI: [name] is an 64 y.o. male with hx of metastatic adenocarcinoma of the rectum, hx of hydronephrosis, s/p L ureteral stent, thrombocytopenia, hematuria, pancytopenia, admitted for AKI due to obtructive uropathy, s/p bilateral stent placemnet with improvement of his Cr, s/p BM biopsy today, having hematuria again. He has some pain on his BM bx site but not severe.  Hematuria has improved with irrigation.  Dr Karleen Hampshire decrease IVF rate today.  His Cr improved to 3.2 and K was 4.0. He has no complaints.   Rewiew of Systems:  Constitutional: Negative for malaise, fever and chills. No significant weight loss or weight gain Eyes: Negative for eye pain, redness and discharge, diplopia, visual changes, or flashes of light. ENMT: Negative for ear pain, hoarseness, nasal congestion, sinus pressure and sore throat. No headaches; tinnitus, drooling, or problem swallowing. Cardiovascular: Negative for chest pain, palpitations, diaphoresis, dyspnea and peripheral edema. ; No orthopnea, PND Respiratory: Negative for cough, hemoptysis, wheezing and stridor. No pleuritic chestpain. Gastrointestinal: Negative for nausea, vomiting, diarrhea, constipation, abdominal pain, melena, blood in stool, hematemesis, jaundice and rectal bleeding.    Genitourinary: Negative for frequency, dysuria, incontinence,flank pain and hematuria; Musculoskeletal: Negative for back pain and neck pain. Negative for swelling and trauma.;  Skin: . Negative for pruritus, rash, abrasions, bruising and skin lesion.; ulcerations Neuro: Negative for headache, lightheadedness and neck stiffness. Negative for weakness, altered level of consciousness , altered mental status, extremity weakness, burning feet, involuntary movement, seizure and syncope.  Psych: negative for anxiety, depression, insomnia, tearfulness,  panic attacks, hallucinations, paranoia, suicidal or homicidal ideation    Past Medical History  Diagnosis Date  . Pancytopenia, acquired 12/04/2010  . B12 deficiency 12/07/2010  . Kidney stones   . CAD (coronary artery disease)   . Anemia   . History of radiation therapy 06/19/07 -07/27/07    whole pelvis  . Hx of radiation therapy 08/06/12,08/09/12,& 08/13/12    rul 54GY/59f  . Cancer     Colon ca dx 2008 surg/rad/chemo  . Lung cancer   . FH: chemotherapy   . Rectal bleeding 09/20/2013    From recurrent rectal mass/recurrent rectal adenocarcinoma  . Acute blood loss anemia 09/27/2013  . 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  2.7 cm primary, moderately differentiated with 3/8 positive lymph nodes without LVI.  Surgery was on 04/22/2007 and was treated postoperatively with radiation and concomitant capecitabine and then 4 cycles of a planned 6 with oxaliplatin and capecitabine.  His counts would not allow treatment of  . Hydronephrosis of left kidney 09/24/2013    Kidney stones; s/p cystoscopy and stent  . Gross hematuria 09/25/2103  . Lung metastases 07/09/2013  . Secondary malignancy of right lung 07/09/2013  . Insomnia 06/09/2014  . Foley catheter in place 07/02/14  . Broken collarbone feb 2016  . Myocardial infarction     2003   . Heart murmur   . Sleep apnea     hx of cpap no longer wears   . Diabetes mellitus without complication     hx of no longer on meds   . Hypertension     hx of   . Shortness of breath dyspnea     due to radiaiton treatment   . GERD (gastroesophageal reflux  disease)   . Bilateral hydronephrosis 09/27/2014    Past Surgical History  Procedure Laterality Date  . Ileostomy  12/07/2010    Procedure: ILEOSTOMY;  Surgeon: Donato Heinz;  Location: [facility];  Service: General;  Laterality: N/A;  Diverting Ileostomy Lysis of adhesions Exploratory Laparotomy  . Lung  biopsy  10/27/10    rt lobe- adenocarcinoma  . Cholecystectomy    . Cataract extraction w/ intraocular lens implant  2007    bil  . Lithotripsy  2002  . Coronary angioplasty with stent placement  2003    stenting x 2  . Colon resection  2008    x2  . Esophagogastroduodenoscopy N/A 05/02/2013    Dr. Gala Romney- polypoid antral fold, bx= reactive gastropathy  . Givens capsule study N/A 05/02/2013    Procedure: GIVENS CAPSULE STUDY;  Surgeon: Daneil Dolin, MD;  Location: [facility];  Service: Endoscopy;  Laterality: N/A;  . Colonoscopy N/A 09/20/2013    SLF:A near circumferential fungating mass with friable surfaces was found in the rectum.  ACTIVELY OOZING.  CLOTS SEEN IN LUMEN AND ASPIRATED.  Multiple biopsies were performed using cold forceps.  Thermal therapy(BICAP 7 Fr 25W) was used to ATTEMPT TO control bleeding.    HEMOSTASIS NOT ACHEIVED.  Marland Kitchen Cystoscopy w/ ureteral stent placement Left 09/24/2013    Procedure: CYSTOSCOPY WITH LEFT RETROGRADE PYELOGRAM; LEFT URETERAL STENT PLACEMENT; REMOVAL OF SMALL BLADDER CALCULI;  Surgeon: Marissa Nestle, MD;  Location: [facility];  Service: Urology;  Laterality: Left;  . Flexible sigmoidoscopy N/A 09/25/2013    GSU:[id number] exophytic rectal mass most likely representing recurrent adenocarcinoma status post biopsy  . Cystoscopy with retrograde pyelogram, ureteroscopy and stent placement Left 05/08/2014    Procedure: CYSTOSCOPY/ EVACUATION OF CLOTS  WITH LEFT RETROGRADE PYELOGRAM,LEFT URETEROSCOPY ;  Surgeon: Malka So, MD;  Location: [facility];  Service: Urology;  Laterality: Left;  . Nephrolithotomy Left 05/13/2014    Procedure: LEFT NEPHROLITHOTOMY PERCUTANEOUS FIRST STAGE;  Surgeon: Malka So, MD;  Location: [facility];  Service: Urology;  Laterality: Left;  . Holmium laser application Left 85/92/9244    Procedure: HOLMIUM LASER APPLICATION;  Surgeon: Malka So, MD;  Location: [facility];  Service: Urology;  Laterality: Left;  . Cystoscopy w/ ureteral  stent placement Bilateral 09/26/2014    Procedure: CYSTOSCOPY WITH CLOT EVACUATION; LEFT URETERAL STENT REMOVAL; BILATERAL RETROGRADE PYELOGRAM; BILATERAL URETERAL STENT PLACEMENT;  Surgeon: Alexis Frock, MD;  Location: [facility];  Service: Urology;  Laterality: Bilateral;    Medications:  HOME MEDS: Prior to Admission medications   Medication Sig Start Date End Date Taking? Authorizing Provider  ciprofloxacin (CIPRO) 250 MG tablet Take 250 mg by mouth 2 (two) times daily.   Yes Historical Provider, MD  cyanocobalamin (,VITAMIN B-12,) 1000 MCG/ML injection Inject 1,000 mcg into the muscle every 30 (thirty) days.    Yes Historical Provider, MD  folic acid (FOLVITE) 1 MG tablet Take 1 tablet (1 mg total) by mouth daily. 04/28/14  Yes Baird Cancer, PA-C  HYDROcodone-acetaminophen (NORCO) 7.5-325 MG per tablet Take 1 tablet by mouth every 6 (six) hours as needed for moderate pain. 09/15/14  Yes Baird Cancer, PA-C  hydrocortisone cream 1 % Apply 1 application topically 2 (two) times daily. Mix with urea cream and apply to affected areas. 04/23/14  Yes Baird Cancer, PA-C  lidocaine-prilocaine (EMLA) cream Apply a quarter size amount to port site 1 hour prior to chemo. Do not rub in. Cover with plastic wrap.  03/13/14  Yes Manon Hilding Kefalas, PA-C  magnesium oxide (MAG-OX) 400 (241.3 MG) MG tablet Take 1 tablet (400 mg total) by mouth daily. Patient taking differently: Take 400 mg by mouth daily. Patient takes at [facility] 09/08/14  Yes Patrici Ranks, MD  omeprazole (PRILOSEC) 20 MG capsule Take 1 capsule (20 mg total) by mouth daily. 09/28/13  Yes Rexene Alberts, MD  ondansetron (ZOFRAN) 8 MG tablet Take 1 tablet (8 mg total) by mouth 2 (two) times daily as needed for nausea or vomiting. 03/13/14  Yes Manon Hilding Kefalas, PA-C  temazepam (RESTORIL) 15 MG capsule Take 1-2 capsules (15-30 mg total) by mouth at bedtime as needed for sleep. 06/09/14  Yes Manon Hilding Kefalas, PA-C  urea (CARMOL) 10 % cream Apply  topically as needed. Mix with hydrocortisone cream and apply to affected areas. 04/23/14  Yes Baird Cancer, PA-C     Allergies:  Allergies  Allergen Reactions  . Daypro [Oxaprozin] Nausea And Vomiting  . Xeloda [Capecitabine] Other (See Comments)    Homocidal and suicidal ideations    Social History:   reports that he has quit smoking. His smoking use included Cigarettes and Cigars. He has a 60 pack-year smoking history. He quit smokeless tobacco use about 9 years ago. His smokeless tobacco use included Chew. He reports that he does not drink alcohol or use illicit drugs.  Family History: Family History  Problem Relation Age of Onset  . Cervical cancer Mother   . Rectal cancer Father 63  . Uterine cancer Sister      Physical Exam: Filed Vitals:   10/08/14 0555 10/08/14 1612 10/08/14 2220 10/09/14 0558  BP: 124/58 131/67 131/61 125/59  Pulse: 82 85 93 92  Temp: 98.7 F (37.1 C) 98.3 F (36.8 C)  97.6 F (36.4 C)  TempSrc: Oral Oral  Oral  Resp: 20 18 18 18   Height:      Weight:      SpO2: 97% 97% 98% 99%   Blood pressure 125/59, pulse 92, temperature 97.6 F (36.4 C), temperature source Oral, resp. rate 18, height 5' 9"  (1.753 m), weight 95.119 kg (209 lb 11.2 oz), SpO2 99 %.  GEN:  Pleasant  patient lying in the stretcher in no acute distress; cooperative with exam. PSYCH:  alert and oriented x4; does not appear anxious or depressed; affect is appropriate. HEENT: Mucous membranes pink and anicteric; PERRLA; EOM intact; no cervical lymphadenopathy nor thyromegaly or carotid bruit; no JVD; There were no stridor. Neck is very supple. Breasts:: Not examined CHEST WALL: No tenderness CHEST: Normal respiration, clear to auscultation bilaterally.  HEART: Regular rate and rhythm.  There are no murmur, rub, or gallops.   BACK: No kyphosis or scoliosis; no CVA tenderness ABDOMEN: soft and non-tender; no masses, no organomegaly, normal abdominal bowel sounds; no pannus; no  intertriginous candida. There is no rebound and no distention. Rectal Exam: Not done EXTREMITIES: No bone or joint deformity; age-appropriate arthropathy of the hands and knees; no edema; no ulcerations.  There is no calf tenderness. Genitalia: not examined PULSES: 2+ and symmetric SKIN: Normal hydration no rash or ulceration CNS: Cranial nerves 2-12 grossly intact no focal lateralizing neurologic deficit.  Speech is fluent; uvula elevated with phonation, facial symmetry and tongue midline. DTR are normal bilaterally, cerebella exam is intact, barbinski is negative and strengths are equaled bilaterally.  No sensory loss.   Labs on Admission:  Basic Metabolic Panel:  Recent Labs Lab 10/05/14 0617 10/06/14 0717 10/07/14 9233 10/08/14  1937 10/09/14 0606  NA 139 140 139 141 140  K 3.8 2.9* 3.0* 3.6 4.0  CL 108 108 106 108 108  CO2 20* 23 25 26 26   GLUCOSE 118* 125* 136* 106* 116*  BUN 77* 80* 76* 70* 69*  CREATININE 5.53* 4.92* 4.15* 3.61* 3.23*  CALCIUM 7.4* 7.2* 7.1* 7.6* 7.7*    Recent Labs Lab 10/04/14 0632 10/05/14 0613 10/06/14 0717 10/07/14 0653 10/08/14 0605  WBC 9.4 9.0 6.2 6.7 6.6  HGB 7.6* 8.5* 8.3* 8.4* 10.0*  HCT 21.6* 24.7* 24.1* 25.0* 29.7*  MCV 89.3 89.2 88.6 89.6 88.4  PLT 37* 34* 25* 23* 22*   Cardiac Enzymes: No results for input(s): CKTOTAL, CKMB, CKMBINDEX, TROPONINI in the last 168 hours.  CBG:  Recent Labs Lab 10/08/14 2044 10/08/14 2335 10/09/14 0313 10/09/14 0736 10/09/14 1135  GLUCAP 129* 131* 108* 116* 124*     Radiological Exams on Admission: Ct Biopsy  10/08/2014   CLINICAL DATA:  64 year old male with pancytopenia  EXAM: CT BIOPSY  Date: 10/08/2014  PROCEDURE: 1. CT guided bone marrow aspiration and core biopsy Interventional Radiologist:  Criselda Peaches, MD  ANESTHESIA/SEDATION: Moderate (conscious) sedation was used. 1 mg Versed, 50 mcg Fentanyl were administered intravenously. The patient's vital signs were monitored  continuously by radiology nursing throughout the procedure.  Sedation Time: 5 minutes  TECHNIQUE: Informed consent was obtained from the patient following explanation of the procedure, risks, benefits and alternatives. The patient understands, agrees and consents for the procedure. All questions were addressed. A time out was performed.  The patient was positioned prone and noncontrast localization CT was performed of the pelvis to demonstrate the iliac marrow spaces.  Maximal barrier sterile technique utilized including caps, mask, sterile gowns, sterile gloves, large sterile drape, hand hygiene, and betadine prep.  Under sterile conditions and local anesthesia, an 11 gauge coaxial bone biopsy needle was advanced into the right iliac marrow space. Needle position was confirmed with CT imaging. Initially, bone marrow aspiration was performed. Next, the 11 gauge outer cannula was utilized to obtain a right iliac bone marrow core biopsy. Needle was removed. Hemostasis was obtained with compression. The patient tolerated the procedure well. Samples were prepared with the cytotechnologist. No immediate complications.  IMPRESSION: CT guided right iliac bone marrow aspiration and core biopsy.  Signed,  Criselda Peaches, MD  [facility]  [facility]   Electronically Signed   By: Jacqulynn Cadet M.D.   On: 10/08/2014 18:06   Assessment/Plan Present on Admission:  . Acute kidney injury . (Resolved) Hydronephrosis of left kidney . Thrombocytopenia . Adenocarcinoma of rectum . Anemia of chronic disease . Bilateral hydronephrosis . Obstructive uropathy . Gross hematuria . Acute blood loss anemia . Recurrent UTI . Pancytopenia, acquired . Hyperkalemia  PLAN:  1. Acute kidney injury, likely from obstructive uropathy. The patient's baseline creatinine from early March 2016 was within normal limits. Over the last couple weeks, it has progressively increased. On  admission, his creatinine was 3.86. He was started on vigorous IV fluids and urologist, Dr. Tresa Moore performed cystoscopy and bilateral ureteral stent placement. Nephrology was consulted and has been managing fluids/lasix dosing.  Renal ultrasound on 4/30 revealed Post right-sided double-J ureteral stent placement with minimal improvement in persistent mild right-sided pelvicaliectasis;Mild to moderate residual left-sided pelvicaliectasis, improved since abdominal CT performed 09/26/2014. His creatinine rose to peak of 5.5 and has since started to improve. He continued on IV fluids and urine output has been excellent. We'll  continue current treatments and monitor for further improvements. Hopefully it will improved to about 1.5 to 2.0 which I feel is his baseline.   Hyperkalemia; hypokalemia. Improved with kayexalate  Obstructive uropathy/hydronephrosis. The patient has a history of left hydronephrosis and had a stent placement by urology in the past. On admission, CT of his abdomen and pelvis revealed severe left hydronephrosis and new mild right hydronephrosis. Following admission, Dr. Tresa Moore performed cystoscopy, removal of the previous left stent and placement of bilateral ureteral stents. He has had follow-up renal ultrasounds which indicate persistent bilateral hydronephrosis and enlarged bladder. Seen by Dr. Jeffie Pollock on 5/6 with extensive flushing of his catheter and removal of several clots. He has hematuria again, and will require flushing of his bladder.   Gross hematuria/pyuria/bacteriuria-treating as UTI. Urine culture ordered and is negative to date Cipro was started empirically. He has done 7 days of cipro. Antibiotics have been discontinued. He is afebrile.  Acute and chronic blood loss anemia, superimposed on anemia of chronic disease. 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 His hemoglobin was  9.4 on 4/28 and 7.8 at the time of admission. He has been transfused a total of 10 units of packed red blood cells. Hemoglobin appears to be stable.  We'll continue to monitor his CBC   Acquired pancytopenia/thrombocytopenia. Appears to be a chronic finding, in part, from chemotherapy/radiation/chronic disease and from bleeding. His platelet count was 78 on admission and has drifted down to 18 in the setting of gross GU bleeding. His baseline platelet count appears to be 49-58. He was transfused a total of 4 units of platelets.  Hematology consulted for persistent/worsening thrombocytopenia. They have arranged bone marrow biopsy to be done on 5/11.  Type 2 diabetes mellitus. Apparently, he is treated with diet alone now. We'll continue sliding scale NovoLog, sensitive scale. His CBGs are well controlled.  Metastatic, stage IV adenocarcinoma of the rectum. The patient is followed by oncology and radiation oncology. He is being treated with XRT and single agent chemotherapy per review of the notes and per patient history. Oncology was notified of the patient's hospitalization.  Isolated elevated blood pressure; he has a history of hypertension. On when necessary hydralazine and continue to monitor for need for oral medication.   Other plans as per orders.  Code Status: FULL Haskel Khan, MD. [facility] Pager [phone] 7pm to 7am.  Other plans as per orders.  10/09/2014, 1:21 PM

## 2014-10-09 NOTE — Care Management Note (Signed)
Case Management Note  Patient Details  Name: CHARBEL LOS MRN: [id number] Date of Birth: 01-20-1951   Expected Discharge Date:  10/08/14               Expected Discharge Plan:  [place]  In-House Referral:     Discharge planning Services  CM Consult  Post Acute Care Choice:  Home Health Choice offered to:  Patient  DME Arranged:    DME Agency:     HH Arranged:  RN [place] Agency:  [place]  Status of Service:  In process, will continue to follow  Medicare Important Message Given:    Date Medicare IM Given:    Medicare IM give by:    Date Additional Medicare IM Given:    Additional Medicare Important Message give by:     If discussed at [place] of Stay Meetings, dates discussed:  10/09/2014  Additional Comments:  Sherald Barge, RN 10/09/2014, 3:31 PM

## 2014-10-10 LAB — CBC WITH DIFFERENTIAL/PLATELET
BASOS PCT: 0 % (ref 0–1)
Basophils Absolute: 0 10*3/uL (ref 0.0–0.1)
EOS PCT: 0 % (ref 0–5)
Eosinophils Absolute: 0 10*3/uL (ref 0.0–0.7)
HCT: 28.6 % — ABNORMAL LOW (ref 39.0–52.0)
Hemoglobin: 9.2 g/dL — ABNORMAL LOW (ref 13.0–17.0)
Lymphocytes Relative: 3 % — ABNORMAL LOW (ref 12–46)
Lymphs Abs: 0.3 10*3/uL — ABNORMAL LOW (ref 0.7–4.0)
MCH: 29.2 pg (ref 26.0–34.0)
MCHC: 32.2 g/dL (ref 30.0–36.0)
MCV: 90.8 fL (ref 78.0–100.0)
Monocytes Absolute: 0.5 10*3/uL (ref 0.1–1.0)
Monocytes Relative: 6 % (ref 3–12)
NEUTROS ABS: 7.7 10*3/uL (ref 1.7–7.7)
Neutrophils Relative %: 90 % — ABNORMAL HIGH (ref 43–77)
Platelets: 31 10*3/uL — ABNORMAL LOW (ref 150–400)
RBC: 3.15 MIL/uL — AB (ref 4.22–5.81)
RDW: 17 % — ABNORMAL HIGH (ref 11.5–15.5)
Smear Review: DECREASED
WBC: 8.6 10*3/uL (ref 4.0–10.5)

## 2014-10-10 LAB — BASIC METABOLIC PANEL
ANION GAP: 6 (ref 5–15)
BUN: 64 mg/dL — ABNORMAL HIGH (ref 6–20)
CALCIUM: 7.6 mg/dL — AB (ref 8.9–10.3)
CO2: 25 mmol/L (ref 22–32)
CREATININE: 2.94 mg/dL — AB (ref 0.61–1.24)
Chloride: 108 mmol/L (ref 101–111)
GFR calc non Af Amer: 22 mL/min — ABNORMAL LOW (ref >60)
GFR, EST AFRICAN AMERICAN: 25 mL/min — AB (ref >60)
GLUCOSE: 120 mg/dL — AB (ref 65–99)
Potassium: 4.4 mmol/L (ref 3.5–5.1)
SODIUM: 139 mmol/L (ref 135–145)

## 2014-10-10 LAB — GLUCOSE, CAPILLARY
GLUCOSE-CAPILLARY: 105 mg/dL — AB (ref 65–99)
Glucose-Capillary: 106 mg/dL — ABNORMAL HIGH (ref 65–99)
Glucose-Capillary: 127 mg/dL — ABNORMAL HIGH (ref 65–99)
Glucose-Capillary: 135 mg/dL — ABNORMAL HIGH (ref 65–99)
Glucose-Capillary: 95 mg/dL (ref 65–99)

## 2014-10-10 MED ORDER — FUROSEMIDE 10 MG/ML IJ SOLN
20.0000 mg | Freq: Two times a day (BID) | INTRAMUSCULAR | Status: DC
  Administered 2014-10-10 – 2014-10-11 (×3): 20 mg via INTRAVENOUS
  Filled 2014-10-10 (×3): qty 2

## 2014-10-10 NOTE — Care Management Utilization Note (Signed)
UR completed 

## 2014-10-10 NOTE — Care Management Note (Signed)
Case Management Note  Patient Details  Name: [name] MRN: [id number] Date of Birth: 16-Jul-1950   Expected Discharge Date:  10/08/14               Expected Discharge Plan:  [place]  In-House Referral:     Discharge planning Services  CM Consult  Post Acute Care Choice:  Home Health Choice offered to:  Patient  DME Arranged:    DME Agency:     HH Arranged:  RN [place] Agency:  [place]  Status of Service:  Completed, signed off  Medicare Important Message Given:  Yes Date Medicare IM Given:  10/10/14 Medicare IM give by:  Jolene Provost, RN, MSN, CM  Date Additional Medicare IM Given:    Additional Medicare Important Message give by:     If discussed at [place] of Stay Meetings, dates discussed:    Additional Comments: Anticipate DC over weekend with [facility] services. If DC'd over weekend pt's RN will notify [place] of DC. No further CM needs.   Sherald Barge, RN 10/10/2014, 2:13 PM

## 2014-10-10 NOTE — Clinical Documentation Improvement (Addendum)
  Query #1 Please clarify the cause of the patient's bilateral hydronephrosis and acute kidney injury in the progress notes and discharge summary:   - malposition of the in situ left ureteral stent  - hemorrhagic cystitis  - a combination of malposition of the in situ left ureteral stent and hemorrhagic cystitis  - other cause  - unable to clinically determine  Query #2 Please clarify the following documentation by the nephrologists during this admission; "Low E56 possibly metabolic. Patient presently on sodium bicarbonate.  His C02 has corrected."   - metabolic acidosis  - other condition  - unable to clinically determine  Thank You, Erling Conte ,RN Clinical Documentation Specialist:  [place] [facility]

## 2014-10-10 NOTE — Progress Notes (Signed)
Subjective: Interval History: Patient overall feels good. He denies any nausea or vomiting. His appetite is good and no difficulty in breathing.  Objective: Vital signs in last 24 hours: Temp:  [98.4 F (36.9 C)-98.6 F (37 C)] 98.4 F (36.9 C) (05/13 0447) Pulse Rate:  [80-93] 93 (05/13 0447) Resp:  [18-19] 19 (05/13 0447) BP: (125-127)/(55-60) 127/55 mmHg (05/13 0447) SpO2:  [97 %-99 %] 97 % (05/13 0447) Weight change:   Intake/Output from previous day: 05/12 0701 - 05/13 0700 In: 2069.6 [P.O.:840; I.V.:1109.6] Out: 2750 [Urine:2400; Stool:350] Intake/Output this shift:    Generally patient is alert and in no apparent distress. Chest is clear to auscultation, no rales rhonchi or egophony. His heart exam revealed regular rate and rhythm no murmur no S3 Abdomen: Soft nontender Extremities 1+  edema  Lab Results:  Recent Labs  10/08/14 0605  WBC 6.6  HGB 10.0*  HCT 29.7*  PLT 22*   BMET:   Recent Labs  10/09/14 0606 10/10/14 0633  NA 140 139  K 4.0 4.4  CL 108 108  CO2 26 25  GLUCOSE 116* 120*  BUN 69* 64*  CREATININE 3.23* 2.94*  CALCIUM 7.7* 7.6*   No results for input([name]): PTH in the last 72 hours. Iron Studies:  No results for input([name]): IRON, TIBC, TRANSFERRIN, FERRITIN in the last 72 hours.  Studies/Results: Ct Biopsy  10/08/2014   CLINICAL DATA:  64 year old male with pancytopenia  EXAM: CT BIOPSY  Date: 10/08/2014  PROCEDURE: 1. CT guided bone marrow aspiration and core biopsy Interventional Radiologist:  [name] Peaches, MD  ANESTHESIA/SEDATION: Moderate (conscious) sedation was used. 1 mg Versed, 50 mcg Fentanyl were administered intravenously. The patient'[name] vital signs were monitored continuously by radiology nursing throughout the procedure.  Sedation Time: 5 minutes  TECHNIQUE: Informed consent was obtained from the patient following explanation of the procedure, risks, benefits and alternatives. The patient understands, agrees and consents for  the procedure. All questions were addressed. A time out was performed.  The patient was positioned prone and noncontrast localization CT was performed of the pelvis to demonstrate the iliac marrow spaces.  Maximal barrier sterile technique utilized including caps, mask, sterile gowns, sterile gloves, large sterile drape, hand hygiene, and betadine prep.  Under sterile conditions and local anesthesia, an 11 gauge coaxial bone biopsy needle was advanced into the right iliac marrow space. Needle position was confirmed with CT imaging. Initially, bone marrow aspiration was performed. Next, the 11 gauge outer cannula was utilized to obtain a right iliac bone marrow core biopsy. Needle was removed. Hemostasis was obtained with compression. The patient tolerated the procedure well. Samples were prepared with the cytotechnologist. No immediate complications.  IMPRESSION: CT guided right iliac bone marrow aspiration and core biopsy.  Signed,  [name] Peaches, MD  [facility]  [facility]   Electronically Signed   By: [name] M.D.   On: 10/08/2014 18:06    I have reviewed the patient'[name] current medications.  Assessment/Plan: Problem #1 acute kidney injury: Obstructive. His BUN and creatinine is 64 and 2.94 his renal function continued to improve. Presently patient is non-oliguric. Problem #2 chronic renal failure: Stage III Problem #3 history of diabetes: Patient denies any polyuria or polydipsia. Problem #4 hypokalemia: His potassium is normal. Problem #5 bilateral hydronephrosis: His status post bilateral just stent placement and is hydronephrosis. Patient presently on a larger dose of Lasix and his urine output has improved. Patient had 2400 mL of urine output. Problem #  6 anemia: His hemoglobin is low but stable. Iron studies are pending. Problem #7 history of UTI : Patient is presently afebrile and his white blood cell count also has come to a normal  level. Problem #8. Low CO2 possibly metabolic. Patient presently on sodium bicarbonate. His CO2 has corrected. Plan: 1] We'll DC IV fluid  2] we'll give Lasix 20 mg IV twice a day 3] We'll check his basic metabolic panel in the morning.   LOS: 14 days   [name] 10/10/2014,8:13 AM

## 2014-10-10 NOTE — Progress Notes (Signed)
[facility] PROGRESS NOTE  [name] GYF:[id number] DOB: [birth year]    PCP:   [name], MD   HPI: [name] is an 64 y.o. male with hx of metastatic adenocarcinoma of the rectum, hx of hydronephrosis, s/p L ureteral stent, thrombocytopenia, hematuria, pancytopenia, admitted for AKI due to obtructive uropathy, s/p bilateral stent placemnet with improvement of his Cr, s/p BM biopsy today, having hematuria again. He has some pain on his BM bx site but not severe.  Hematuria has improved with irrigation. dr Eli Phillips d/c his IVF. His Cr improved to 2.9  He has no complaints.   Rewiew of Systems:  Constitutional: Negative for malaise, fever and chills. No significant weight loss or weight gain Eyes: Negative for eye pain, redness and discharge, diplopia, visual changes, or flashes of light. ENMT: Negative for ear pain, hoarseness, nasal congestion, sinus pressure and sore throat. No headaches; tinnitus, drooling, or problem swallowing. Cardiovascular: Negative for chest pain, palpitations, diaphoresis, dyspnea and peripheral edema. ; No orthopnea, PND Respiratory: Negative for cough, hemoptysis, wheezing and stridor. No pleuritic chestpain. Gastrointestinal: Negative for nausea, vomiting, diarrhea, constipation, abdominal pain, melena, blood in stool, hematemesis, jaundice and rectal bleeding.    Genitourinary: Negative for frequency, dysuria, incontinence,flank pain and hematuria; Musculoskeletal: Negative for back pain and neck pain. Negative for swelling and trauma.;  Skin: . Negative for pruritus, rash, abrasions, bruising and skin lesion.; ulcerations Neuro: Negative for headache, lightheadedness and neck stiffness. Negative for weakness, altered level of consciousness , altered mental status, extremity weakness, burning feet, involuntary movement, seizure and syncope.  Psych: negative for anxiety, depression, insomnia, tearfulness, panic attacks, hallucinations,  paranoia, suicidal or homicidal ideation    Past Medical History  Diagnosis Date  . Pancytopenia, acquired 12/04/2010  . B12 deficiency 12/07/2010  . Kidney stones   . CAD (coronary artery disease)   . Anemia   . History of radiation therapy 06/19/07 -07/27/07    whole pelvis  . Hx of radiation therapy 08/06/12,08/09/12,& 08/13/12    rul 54GY/30f  . Cancer     Colon ca dx 2008 surg/rad/chemo  . Lung cancer   . FH: chemotherapy   . Rectal bleeding 09/20/2013    From recurrent rectal mass/recurrent rectal adenocarcinoma  . Acute blood loss anemia 09/27/2013  . 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  2.7 cm primary, moderately differentiated with 3/8 positive lymph nodes without LVI.  Surgery was on 04/22/2007 and was treated postoperatively with radiation and concomitant capecitabine and then 4 cycles of a planned 6 with oxaliplatin and capecitabine.  His counts would not allow treatment of  . Hydronephrosis of left kidney 09/24/2013    Kidney stones; s/p cystoscopy and stent  . Gross hematuria 09/25/2103  . Lung metastases 07/09/2013  . Secondary malignancy of right lung 07/09/2013  . Insomnia 06/09/2014  . Foley catheter in place 07/02/14  . Broken collarbone feb 2016  . Myocardial infarction     2003   . Heart murmur   . Sleep apnea     hx of cpap no longer wears   . Diabetes mellitus without complication     hx of no longer on meds   . Hypertension     hx of   . Shortness of breath dyspnea     due to radiaiton treatment   . GERD (gastroesophageal reflux disease)   . Bilateral hydronephrosis  09/27/2014    Past Surgical History  Procedure Laterality Date  . Ileostomy  12/07/2010    Procedure: ILEOSTOMY;  Surgeon: Donato Heinz;  Location: [facility];  Service: General;  Laterality: N/A;  Diverting Ileostomy Lysis of adhesions Exploratory Laparotomy  . Lung biopsy  10/27/10    rt lobe-  adenocarcinoma  . Cholecystectomy    . Cataract extraction w/ intraocular lens implant  2007    bil  . Lithotripsy  2002  . Coronary angioplasty with stent placement  2003    stenting x 2  . Colon resection  2008    x2  . Esophagogastroduodenoscopy N/A 05/02/2013    Dr. Gala Romney- polypoid antral fold, bx= reactive gastropathy  . Givens capsule study N/A 05/02/2013    Procedure: GIVENS CAPSULE STUDY;  Surgeon: Daneil Dolin, MD;  Location: [facility];  Service: Endoscopy;  Laterality: N/A;  . Colonoscopy N/A 09/20/2013    SLF:A near circumferential fungating mass with friable surfaces was found in the rectum.  ACTIVELY OOZING.  CLOTS SEEN IN LUMEN AND ASPIRATED.  Multiple biopsies were performed using cold forceps.  Thermal therapy(BICAP 7 Fr 25W) was used to ATTEMPT TO control bleeding.    HEMOSTASIS NOT ACHEIVED.  Marland Kitchen Cystoscopy w/ ureteral stent placement Left 09/24/2013    Procedure: CYSTOSCOPY WITH LEFT RETROGRADE PYELOGRAM; LEFT URETERAL STENT PLACEMENT; REMOVAL OF SMALL BLADDER CALCULI;  Surgeon: Marissa Nestle, MD;  Location: [facility];  Service: Urology;  Laterality: Left;  . Flexible sigmoidoscopy N/A 09/25/2013    QBH:ALPFXTKWI exophytic rectal mass most likely representing recurrent adenocarcinoma status post biopsy  . Cystoscopy with retrograde pyelogram, ureteroscopy and stent placement Left 05/08/2014    Procedure: CYSTOSCOPY/ EVACUATION OF CLOTS  WITH LEFT RETROGRADE PYELOGRAM,LEFT URETEROSCOPY ;  Surgeon: Malka So, MD;  Location: [facility];  Service: Urology;  Laterality: Left;  . Nephrolithotomy Left 05/13/2014    Procedure: LEFT NEPHROLITHOTOMY PERCUTANEOUS FIRST STAGE;  Surgeon: Malka So, MD;  Location: [facility];  Service: Urology;  Laterality: Left;  . Holmium laser application Left 09/73/5329    Procedure: HOLMIUM LASER APPLICATION;  Surgeon: Malka So, MD;  Location: [facility];  Service: Urology;  Laterality: Left;  . Cystoscopy w/ ureteral stent placement Bilateral  09/26/2014    Procedure: CYSTOSCOPY WITH CLOT EVACUATION; LEFT URETERAL STENT REMOVAL; BILATERAL RETROGRADE PYELOGRAM; BILATERAL URETERAL STENT PLACEMENT;  Surgeon: Alexis Frock, MD;  Location: [facility];  Service: Urology;  Laterality: Bilateral;    Medications:  HOME MEDS: Prior to Admission medications   Medication Sig Start Date End Date Taking? Authorizing Provider  ciprofloxacin (CIPRO) 250 MG tablet Take 250 mg by mouth 2 (two) times daily.   Yes Historical Provider, MD  cyanocobalamin (,VITAMIN B-12,) 1000 MCG/ML injection Inject 1,000 mcg into the muscle every 30 (thirty) days.    Yes Historical Provider, MD  folic acid (FOLVITE) 1 MG tablet Take 1 tablet (1 mg total) by mouth daily. 04/28/14  Yes Baird Cancer, PA-C  HYDROcodone-acetaminophen (NORCO) 7.5-325 MG per tablet Take 1 tablet by mouth every 6 (six) hours as needed for moderate pain. 09/15/14  Yes Baird Cancer, PA-C  hydrocortisone cream 1 % Apply 1 application topically 2 (two) times daily. Mix with urea cream and apply to affected areas. 04/23/14  Yes Baird Cancer, PA-C  lidocaine-prilocaine (EMLA) cream Apply a quarter size amount to port site 1 hour prior to chemo. Do not rub in. Cover with plastic wrap. 03/13/14  Yes Baird Cancer,  PA-C  magnesium oxide (MAG-OX) 400 (241.3 MG) MG tablet Take 1 tablet (400 mg total) by mouth daily. Patient taking differently: Take 400 mg by mouth daily. Patient takes at [facility] 09/08/14  Yes Patrici Ranks, MD  omeprazole (PRILOSEC) 20 MG capsule Take 1 capsule (20 mg total) by mouth daily. 09/28/13  Yes Rexene Alberts, MD  ondansetron (ZOFRAN) 8 MG tablet Take 1 tablet (8 mg total) by mouth 2 (two) times daily as needed for nausea or vomiting. 03/13/14  Yes Manon Hilding Kefalas, PA-C  temazepam (RESTORIL) 15 MG capsule Take 1-2 capsules (15-30 mg total) by mouth at bedtime as needed for sleep. 06/09/14  Yes Manon Hilding Kefalas, PA-C  urea (CARMOL) 10 % cream Apply topically as needed. Mix  with hydrocortisone cream and apply to affected areas. 04/23/14  Yes Baird Cancer, PA-C     Allergies:  Allergies  Allergen Reactions  . Daypro [Oxaprozin] Nausea And Vomiting  . Xeloda [Capecitabine] Other (See Comments)    Homocidal and suicidal ideations    Social History:   reports that he has quit smoking. His smoking use included Cigarettes and Cigars. He has a 60 pack-year smoking history. He quit smokeless tobacco use about 9 years ago. His smokeless tobacco use included Chew. He reports that he does not drink alcohol or use illicit drugs.  Family History: Family History  Problem Relation Age of Onset  . Cervical cancer Mother   . Rectal cancer Father 15  . Uterine cancer Sister      Physical Exam: Filed Vitals:   10/09/14 0558 10/09/14 1300 10/09/14 2301 10/10/14 0447  BP: 125/59 125/56 126/60 127/55  Pulse: 92 83 80 93  Temp: 97.6 F (36.4 C) 98.4 F (36.9 C) 98.6 F (37 C) 98.4 F (36.9 C)  TempSrc: Oral  Oral Oral  Resp: _0 Height:      Weight:      SpO2: 99% 99% 98% 97%   Blood pressure 127/55, pulse 93, temperature 98.4 F (36.9 C), temperature source Oral, resp. rate 19, height _1  (1.753 m), weight 95.119 kg (209 lb 11.2 oz), SpO2 97 %.  GEN:  Pleasant  patient lying in the stretcher in no acute distress; cooperative with exam. PSYCH:  alert and oriented x4; does not appear anxious or depressed; affect is appropriate. HEENT: Mucous membranes pink and anicteric; PERRLA; EOM intact; no cervical lymphadenopathy nor thyromegaly or carotid bruit; no JVD; There were no stridor. Neck is very supple. Breasts:: Not examined CHEST WALL: No tenderness CHEST: Normal respiration, clear to auscultation bilaterally.  HEART: Regular rate and rhythm.  There are no murmur, rub, or gallops.   BACK: No kyphosis or scoliosis; no CVA tenderness ABDOMEN: soft and non-tender; no masses, no organomegaly, normal abdominal bowel sounds; no pannus; no  intertriginous candida. There is no rebound and no distention. Rectal Exam: Not done EXTREMITIES: No bone or joint deformity; age-appropriate arthropathy of the hands and knees; no edema; no ulcerations.  There is no calf tenderness. Genitalia: not examined PULSES: 2+ and symmetric SKIN: Normal hydration no rash or ulceration CNS: Cranial nerves 2-12 grossly intact no focal lateralizing neurologic deficit.  Speech is fluent; uvula elevated with phonation, facial symmetry and tongue midline. DTR are normal bilaterally, cerebella exam is intact, barbinski is negative and strengths are equaled bilaterally.  No sensory loss.   Labs on Admission:  Basic Metabolic Panel:  Recent Labs Lab 10/06/14 0717 10/07/14 5465 10/08/14 0354 10/09/14 0606 10/10/14 6568  NA 140 139 141 140 139  K 2.9* 3.0* 3.6 4.0 4.4  CL 108 106 108 108 108  CO2 _0 GLUCOSE 125* 136* 106* 116* 120*  BUN 80* 76* 70* 69* 64*  CREATININE 4.92* 4.15* 3.61* 3.23* 2.94*  CALCIUM 7.2* 7.1* 7.6* 7.7* 7.6*   CBC:  Recent Labs Lab 10/05/14 0613 10/06/14 0717 10/07/14 0653 10/08/14 0605 10/10/14 0633  WBC 9.0 6.2 6.7 6.6 8.6  NEUTROABS  --   --   --   --  7.7  HGB 8.5* 8.3* 8.4* 10.0* 9.2*  HCT 24.7* 24.1* 25.0* 29.7* 28.6*  MCV 89.2 88.6 89.6 88.4 90.8  PLT 34* 25* 23* 22* 31*   Cardiac Enzymes: No results for input(s): CKTOTAL, CKMB, CKMBINDEX, TROPONINI in the last 168 hours.  CBG:  Recent Labs Lab 10/09/14 1135 10/09/14 1722 10/09/14 2035 10/10/14 0024 10/10/14 0740  GLUCAP 124* 127* 108* 95 105*     Radiological Exams on Admission: Ct Biopsy  10/08/2014   CLINICAL DATA:  64 year old male with pancytopenia  EXAM: CT BIOPSY  Date: 10/08/2014  PROCEDURE: 1. CT guided bone marrow aspiration and core biopsy Interventional Radiologist:  Criselda Peaches, MD  ANESTHESIA/SEDATION: Moderate (conscious) sedation was used. 1 mg Versed, 50 mcg Fentanyl were administered intravenously. The  patient's vital signs were monitored continuously by radiology nursing throughout the procedure.  Sedation Time: 5 minutes  TECHNIQUE: Informed consent was obtained from the patient following explanation of the procedure, risks, benefits and alternatives. The patient understands, agrees and consents for the procedure. All questions were addressed. A time out was performed.  The patient was positioned prone and noncontrast localization CT was performed of the pelvis to demonstrate the iliac marrow spaces.  Maximal barrier sterile technique utilized including caps, mask, sterile gowns, sterile gloves, large sterile drape, hand hygiene, and betadine prep.  Under sterile conditions and local anesthesia, an 11 gauge coaxial bone biopsy needle was advanced into the right iliac marrow space. Needle position was confirmed with CT imaging. Initially, bone marrow aspiration was performed. Next, the 11 gauge outer cannula was utilized to obtain a right iliac bone marrow core biopsy. Needle was removed. Hemostasis was obtained with compression. The patient tolerated the procedure well. Samples were prepared with the cytotechnologist. No immediate complications.  IMPRESSION: CT guided right iliac bone marrow aspiration and core biopsy.  Signed,  Criselda Peaches, MD  [facility]  [facility]   Electronically Signed   By: Jacqulynn Cadet M.D.   On: 10/08/2014 18:06    Assessment/Plan Present on Admission:  . Acute kidney injury . (Resolved) Hydronephrosis of left kidney . Thrombocytopenia . Adenocarcinoma of rectum . Anemia of chronic disease . Bilateral hydronephrosis . Obstructive uropathy . Gross hematuria . Acute blood loss anemia . Recurrent UTI . Pancytopenia, acquired . Hyperkalemia  PLAN:1. Acute kidney injury, likely from obstructive uropathy. The patient's baseline creatinine from early March 2016 was within normal limits. Over the last couple weeks,  it has progressively increased. On admission, his creatinine was 3.86. He was started on vigorous IV fluids and urologist, Dr. Tresa Moore performed cystoscopy and bilateral ureteral stent placement. Nephrology was consulted and has been managing fluids/lasix dosing.  Renal ultrasound on 4/30 revealed Post right-sided double-J ureteral stent placement with minimal improvement in persistent mild right-sided pelvicaliectasis;Mild to moderate residual left-sided pelvicaliectasis, improved since abdominal CT performed 09/26/2014. His creatinine rose to peak of 5.5 and has since started to improve. He  continued on IV fluids and urine output has been excellent. IVF has been discontinued today by renal, and Cr is 2.9.  Planned to discharge him tomorrow.   Hyperkalemia; hypokalemia. Improved with kayexalate  Obstructive uropathy/hydronephrosis. The patient has a history of left hydronephrosis and had a stent placement by urology in the past. On admission, CT of his abdomen and pelvis revealed severe left hydronephrosis and new mild right hydronephrosis. Following admission, Dr. Tresa Moore performed cystoscopy, removal of the previous left stent and placement of bilateral ureteral stents. He has had follow-up renal ultrasounds which indicate persistent bilateral hydronephrosis and enlarged bladder. Seen by Dr. Jeffie Pollock on 5/6 with extensive flushing of his catheter and removal of several clots. He has hematuria again, and will require flushing of his bladder.   Gross hematuria/pyuria/bacteriuria-treating as UTI. Urine culture ordered and is negative to date Cipro was started empirically. He has done 7 days of cipro. Antibiotics have been discontinued. He is afebrile.  Acute and chronic blood loss anemia, superimposed on anemia of chronic disease. 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 His hemoglobin was 9.4 on 4/28 and 7.8  at the time of admission. He has been transfused a total of 10 units of packed red blood cells. Hemoglobin appears to be stable.  We'll continue to monitor his CBC   Acquired pancytopenia/thrombocytopenia. Appears to be a chronic finding, in part, from chemotherapy/radiation/chronic disease and from bleeding. His platelet count was 78 on admission and has drifted down to 18 in the setting of gross GU bleeding. His baseline platelet count appears to be 49-58. He was transfused a total of 4 units of platelets.  Hematology consulted for persistent/worsening thrombocytopenia. They have arranged bone marrow biopsy to be done on 5/11.  Type 2 diabetes mellitus. Apparently, he is treated with diet alone now. We'll continue sliding scale NovoLog, sensitive scale. His CBGs are well controlled.  Metastatic, stage IV adenocarcinoma of the rectum. The patient is followed by oncology and radiation oncology. He is being treated with XRT and single agent chemotherapy per review of the notes and per patient history. Oncology was notified of the patient's hospitalization.  Isolated elevated blood pressure; he has a history of hypertension. On when necessary hydralazine and continue to monitor for need for oral medication.   Other plans as per orders.  Code Status: FULL Haskel Khan, MD. [facility] Pager [phone] 7pm to 7am.  10/10/2014, 11:02 AM

## 2014-10-11 LAB — BASIC METABOLIC PANEL
ANION GAP: 7 (ref 5–15)
BUN: 67 mg/dL — ABNORMAL HIGH (ref 6–20)
CALCIUM: 7.7 mg/dL — AB (ref 8.9–10.3)
CHLORIDE: 105 mmol/L (ref 101–111)
CO2: 25 mmol/L (ref 22–32)
CREATININE: 3.13 mg/dL — AB (ref 0.61–1.24)
GFR calc Af Amer: 23 mL/min — ABNORMAL LOW (ref >60)
GFR, EST NON AFRICAN AMERICAN: 20 mL/min — AB (ref >60)
Glucose, Bld: 110 mg/dL — ABNORMAL HIGH (ref 65–99)
Potassium: 4.7 mmol/L (ref 3.5–5.1)
SODIUM: 137 mmol/L (ref 135–145)

## 2014-10-11 LAB — GLUCOSE, CAPILLARY
GLUCOSE-CAPILLARY: 119 mg/dL — AB (ref 65–99)
Glucose-Capillary: 101 mg/dL — ABNORMAL HIGH (ref 65–99)
Glucose-Capillary: 79 mg/dL (ref 65–99)
Glucose-Capillary: 83 mg/dL (ref 65–99)

## 2014-10-11 MED ORDER — FUROSEMIDE 40 MG PO TABS: 40.0000 mg | ORAL_TABLET | Freq: Every day | ORAL | Status: DC

## 2014-10-11 MED ORDER — SODIUM BICARBONATE 650 MG PO TABS: 1300.0000 mg | ORAL_TABLET | Freq: Three times a day (TID) | ORAL | Status: DC

## 2014-10-11 MED ORDER — HEPARIN SOD (PORK) LOCK FLUSH 100 UNIT/ML IV SOLN
500.0000 [IU] | INTRAVENOUS | Status: AC | PRN
  Administered 2014-10-11: 500 [IU]
  Filled 2014-10-11: qty 5

## 2014-10-11 NOTE — Progress Notes (Signed)
Patient with orders to be discharge home. Discharge instructions given, patient and spouse verbalized understanding. Patient stable. Patient left in private vehicle with family.

## 2014-10-11 NOTE — Discharge Summary (Signed)
Physician Discharge Summary  [name] UEA:[id number] DOB: January 28, 1951 DOA: 09/26/2014  PCP: [name], MD  Admit date: 09/26/2014 Discharge date: 10/11/2014  Time spent: 35 minutes  Recommendations for Outpatient Follow-up:  1. PCP in one week. 2. Seeing Dr Burnett Sheng in one week.  Discharge Diagnoses:  Principal Problem:   Bilateral hydronephrosis Active Problems:   DM type 2 (diabetes mellitus, type 2)   Adenocarcinoma of rectum   Pancytopenia, acquired   Thrombocytopenia   Acute blood loss anemia   Gross hematuria   Hyperkalemia   Acute kidney injury   Anemia of chronic disease   Obstructive uropathy   Recurrent UTI   Acute renal failure   Discharge Condition: improved.   Diet recommendation: Renal diet.   Filed Weights   09/26/14 2017 10/02/14 0420 10/11/14 0523  Weight: 85.911 kg (189 lb 6.4 oz) 95.119 kg (209 lb 11.2 oz) 99.02 kg (218 lb 4.8 oz)   History of present illness:  Patient was admitted for AKI on CKD by Dr Anastasio Champion on September 26, 2014.  According to his H and P:  " [name] is a 64 y.o. male who was told to come to the emergency room because he was found to have an abnormal lab. He was having preoperative blood work done for renal procedure. He was found to have a significantly elevated creatinine. The patient has an indwelling catheter and a left ureteral stent. It appears the stent is causing some problems and subsequent hydronephrosis. This is why his lab work is abnormal. The patient himself denies any symptoms such as nausea, vomiting, abdominal pain or fever. He is now being admitted for urgent surgery hopefully this evening.  Hospital Course:  Patient was admitted into the hospital, and his AKI was felt to be due to obtructive uropathy.  His left uretal stent was removed, and he had bilateral renal stent placement by Dr Tresa Moore.  His Cr peaked at 5.5, but slowly came down.  On the day of discharge, his Cr was 3.1.  He was seen in  consultation with nephrology as well, and it was recommended that his diuretics be resumed.  He did have IVF thru out his stay.  He did have 2 episodes of hematuria, and had foley irrigation, which had helped.  He also had thrombocytopenia, which felt to have been contributing to his hematuria.  Heme/Onc was consulted, and he was seen by Dr Sheldon Silvan, who ordered a bone marrow biopsy, which was performed, and result is pending.   His Platelet counts have been stable around 20K.  He no longer had hematuria, and Cr improved as noted.  Nephrology has recommended that he be discharged.  He will need to follow up with PCP, urology, heme-onc, and nephrology in follow up.  He has been anxious to get home, and it was felt that he is stable for discharge.    Procedures:  Bilateral ureteral stent placement.  Bone marrow biopsy.   Consultations:  Urology Dr Leonidas Romberg  Nephrology:  Dr Arlean Hopping  Heme:  Dr Darius Bump.  Discharge Exam: Filed Vitals:   10/11/14 1442  BP: 120/65  Pulse: 96  Temp: 97.7 F (36.5 C)  Resp: 18    General: Well Cardiovascular: S1S2. Respiratory: clear.   Discharge Instructions   Discharge Instructions    Diet - low sodium heart healthy    Complete by:  As directed      Discharge instructions    Complete by:  As directed  Please follow up with Dr Hinda Lenis in one week.  You also need to follow up with urologist and your PCP in 1-2 weeks as well.     Increase activity slowly    Complete by:  As directed           Current Discharge Medication List    START taking these medications   Details  furosemide (LASIX) 40 MG tablet Take 1 tablet (40 mg total) by mouth daily. Qty: 30 tablet, Refills: 1    sodium bicarbonate 650 MG tablet Take 2 tablets (1,300 mg total) by mouth 3 (three) times daily. Qty: 90 tablet, Refills: 1      CONTINUE these medications which have NOT CHANGED   Details  cyanocobalamin (,VITAMIN B-12,) 1000 MCG/ML injection Inject 1,000 mcg  into the muscle every 30 (thirty) days.     folic acid (FOLVITE) 1 MG tablet Take 1 tablet (1 mg total) by mouth daily. Qty: 30 tablet, Refills: 5   Associated Diagnoses: Deficiency anemia    HYDROcodone-acetaminophen (NORCO) 7.5-325 MG per tablet Take 1 tablet by mouth every 6 (six) hours as needed for moderate pain. Qty: 90 tablet, Refills: 0   Associated Diagnoses: Adenocarcinoma of rectum    hydrocortisone cream 1 % Apply 1 application topically 2 (two) times daily. Mix with urea cream and apply to affected areas. Qty: 30 g, Refills: 5   Associated Diagnoses: Palmar plantar dysesthesia    lidocaine-prilocaine (EMLA) cream Apply a quarter size amount to port site 1 hour prior to chemo. Do not rub in. Cover with plastic wrap. Qty: 30 g, Refills: 3   Associated Diagnoses: B12 deficiency; Shoulder pain, right; Adenocarcinoma of rectum; URI (upper respiratory infection)    magnesium oxide (MAG-OX) 400 (241.3 MG) MG tablet Take 1 tablet (400 mg total) by mouth daily. Qty: 30 tablet, Refills: 3    omeprazole (PRILOSEC) 20 MG capsule Take 1 capsule (20 mg total) by mouth daily. Qty: 30 capsule, Refills: 2      STOP taking these medications     ciprofloxacin (CIPRO) 250 MG tablet      ondansetron (ZOFRAN) 8 MG tablet      temazepam (RESTORIL) 15 MG capsule      urea (CARMOL) 10 % cream        Allergies  Allergen Reactions  . Daypro [Oxaprozin] Nausea And Vomiting  . Xeloda [Capecitabine] Other (See Comments)    Homocidal and suicidal ideations   Follow-up Information    Follow up with [facility].   Contact information:   [address] [place] [address] [phone]        The results of significant diagnostics from this hospitalization (including imaging, microbiology, ancillary and laboratory) are listed below for reference.    Significant Diagnostic Studies: Ct Abdomen Pelvis Wo Contrast  09/26/2014   CLINICAL DATA:  Left renal  stent placement.  Elevated creatinine.  EXAM: CT ABDOMEN AND PELVIS WITHOUT CONTRAST  TECHNIQUE: Multidetector CT imaging of the abdomen and pelvis was performed following the standard protocol without IV contrast.  COMPARISON:  CT 05/14/2014  FINDINGS: Small nodules peripherally in the right middle lobe are stable since prior study. No pleural effusions. Heart is normal size. Coronary artery calcifications noted.  Prior cholecystectomy. Liver, pancreas, adrenals are unremarkable. Spleen is enlarged with a craniocaudal length of 14.8 cm compared with 12.9 cm previously.  There is severe left hydronephrosis. Proximal end of the left ureteral stent is within the proximal left ureter. The  catheter loops in the urinary bladder. There are locules of gas noted dependently and non dependently within the urinary bladder. Foley catheter is in place. There are also locules of gas within the dilated left renal collecting system. Question related to instrumentation/catheterization of the bladder. Cannot exclude pyonephrosis.  There is mild right hydronephrosis, new since prior study. Bilateral low-density areas within the kidneys likely represent cysts. Punctate nonobstructing right renal stones noted. No ureteral stones.  Postoperative changes from prior subtotal colectomy. Right lower quadrant ostomy noted. There are herniated small bowel loops through the stomal defect into the anterior abdominal wall, similar to prior study. No evidence of bowel obstruction.  Aorta is normal caliber.  No free fluid, free air or adenopathy.  No acute bony abnormality or focal bone lesion.  IMPRESSION: Severe left hydronephrosis. The left ureteral stent loops in the bladder distally with the proximal end of the stent in the proximal left ureter.  Gas within the urinary bladder and left renal collecting system. This may be related to bladder instrumentation/ catheterization, but cannot exclude pyonephrosis.  New mild right hydronephrosis.   Right nephrolithiasis.  Right lower quadrant ostomy again noted with parastomal herniation of small bowel loops. No obstruction.  Increasing splenomegaly.   Electronically Signed   By: Rolm Baptise M.D.   On: 09/26/2014 16:09   Dg Retrograde Pyelogram  09/26/2014   CLINICAL DATA:  Renal failure, left-greater-than-right hydronephrosis  EXAM: RETROGRADE PYELOGRAM  COMPARISON:  None.  FINDINGS: Retrograde opacification on the left shows severe hydronephrosis. A wire is passed in a retrograde fashion into the mid pole calyx. Moderate to severe right hydronephrosis also identified.  IMPRESSION: Bilateral hydronephrosis   Electronically Signed   By: Skipper Cliche M.D.   On: 09/26/2014 20:45   US Renal  10/02/2014   CLINICAL DATA:  Acute on chronic renal failure, hematuria, nephrolithiasis, bilateral ureteral stents  EXAM: RENAL / URINARY TRACT ULTRASOUND COMPLETE  COMPARISON:  CT abdomen pelvis dated 09/26/2014  FINDINGS: Right Kidney:  Length: 15.0 cm. Moderate hydronephrosis. Indwelling ureteral stent.  Left Kidney:  Length: 13.3 cm. Moderate hydronephrosis. Ureteral stent not visualized.  Bladder:  Moderately distended despite indwelling Foley catheter. Suspected layering debris/ hemorrhage within the bladder (image 40).  IMPRESSION: Moderate bilateral hydronephrosis.  Indwelling right ureteral stent. Left ureteral stent not visualized. When correlating with prior CT, this is likely related to low position.  Bladder is moderately distended despite indwelling Foley catheter. Suspected layering debris/hemorrhage within the bladder. Consider bladder irrigation and/or cystoscopy.   Electronically Signed   By: Julian Hy M.D.   On: 10/02/2014 11:56   US Renal  09/27/2014   CLINICAL DATA:  Acute renal failure. History renal stones and bilateral ureteral stents.  EXAM: RENAL / URINARY TRACT ULTRASOUND COMPLETE  COMPARISON:  Fluoroscopic guided bilateral retrograde ureteral stent placement - 09/26/2014; CT  abdomen pelvis- 09/26/2014  FINDINGS: Right Kidney:  Normal cortical thickness, echogenicity and size, measuring 12.0 cm in length. Note is made of an approximately 2.2 x 2.4 x 2.1 cm anechoic cyst within the superior pole the right kidney which is noted to contain a thin internal echogenic septation (Bosniak 2). No echogenic renal stones. The superior aspect of the recently placed double-J ureteral stent appears appropriately positioned within the right renal pelvis though there is persistent mild right-sided pelvicaliectasis though this appears minimally improved since CT scan performed 09/26/2014.  Left Kidney:  Normal cortical thickness, echogenicity and size, measuring 13.1 cm in length. Note is made of an approximately  2.4 x 2.4 x 2.4 cm anechoic cyst within the inferior pole the left kidney. No echogenic renal stones. There is mild to moderate residual left-sided pelvicaliectasis, improved since CT scan performed 09/26/2014. The superior aspect of the left-sided double-J ureteral stent is not depicted on the present examination.  Bladder:  Appears normal for degree of bladder distention.  IMPRESSION: 1. Post right-sided double-J ureteral stent placement with minimal improvement in persistent mild right-sided pelvicaliectasis. 2. Mild to moderate residual left-sided pelvicaliectasis, improved since abdominal CT performed 09/26/2014. The superior aspect of the left-sided double-J ureteral stent is not depicted on this examination though as ureteral stent can be poorly visualized with ultrasound further evaluation could be performed with abdominal radiograph as clinically indicated. 3. Bilateral renal cysts as above.   Electronically Signed   By: Sandi Mariscal M.D.   On: 09/27/2014 10:37   Ct Biopsy  10/08/2014   CLINICAL DATA:  64 year old male with pancytopenia  EXAM: CT BIOPSY  Date: 10/08/2014  PROCEDURE: 1. CT guided bone marrow aspiration and core biopsy Interventional Radiologist:  Criselda Peaches,  MD  ANESTHESIA/SEDATION: Moderate (conscious) sedation was used. 1 mg Versed, 50 mcg Fentanyl were administered intravenously. The patient's vital signs were monitored continuously by radiology nursing throughout the procedure.  Sedation Time: 5 minutes  TECHNIQUE: Informed consent was obtained from the patient following explanation of the procedure, risks, benefits and alternatives. The patient understands, agrees and consents for the procedure. All questions were addressed. A time out was performed.  The patient was positioned prone and noncontrast localization CT was performed of the pelvis to demonstrate the iliac marrow spaces.  Maximal barrier sterile technique utilized including caps, mask, sterile gowns, sterile gloves, large sterile drape, hand hygiene, and betadine prep.  Under sterile conditions and local anesthesia, an 11 gauge coaxial bone biopsy needle was advanced into the right iliac marrow space. Needle position was confirmed with CT imaging. Initially, bone marrow aspiration was performed. Next, the 11 gauge outer cannula was utilized to obtain a right iliac bone marrow core biopsy. Needle was removed. Hemostasis was obtained with compression. The patient tolerated the procedure well. Samples were prepared with the cytotechnologist. No immediate complications.  IMPRESSION: CT guided right iliac bone marrow aspiration and core biopsy.  Signed,  Criselda Peaches, MD  [facility]  [facility]   Electronically Signed   By: Jacqulynn Cadet M.D.   On: 10/08/2014 18:06   Dg Abd Portable 1v  09/28/2014   CLINICAL DATA:  Bilateral double-J stent placement for hydronephrosis  EXAM: PORTABLE ABDOMEN - 1 VIEW  COMPARISON:  September 26, 2014 CT abdomen and pelvis and retrograde pyelogram study  FINDINGS: The left double-J stent loops proximally, likely in a rather markedly dilated collecting system based on recent studies. The tip of the left double-J stent is  in the region of the bladder. The right double-J stent has its proximal aspect at L1-2 and distal aspect in the region of the bladder. There is a generalized paucity of bowel gas. No free air seen.  IMPRESSION: Double-J stent positions as described. The proximal right stent is in the normal flank position. The left stent proximally appears to reside in a dilated renal pelvis in a kidney this somewhat inferiorly positioned. Distally, both stents are felt to be within the urinary bladder.  There is a paucity of bowel gas pattern a paucity of gas may be seen normally but also may be seen with enteritis or ileus. Bowel obstruction  not felt to be likely.   Electronically Signed   By: Lowella Grip III M.D.   On: 09/28/2014 11:07    Labs: Basic Metabolic Panel:  Recent Labs Lab 10/07/14 0653 10/08/14 0605 10/09/14 0606 10/10/14 0633 10/11/14 0645  NA 139 141 140 139 137  K 3.0* 3.6 4.0 4.4 4.7  CL 106 108 108 108 105  CO2 25 26 26 25 25   GLUCOSE 136* 106* 116* 120* 110*  BUN 76* 70* 69* 64* 67*  CREATININE 4.15* 3.61* 3.23* 2.94* 3.13*  CALCIUM 7.1* 7.6* 7.7* 7.6* 7.7*   Liver Function Tests: CBC:  Recent Labs Lab 10/05/14 0613 10/06/14 0717 10/07/14 0653 10/08/14 0605 10/10/14 0633  WBC 9.0 6.2 6.7 6.6 8.6  NEUTROABS  --   --   --   --  7.7  HGB 8.5* 8.3* 8.4* 10.0* 9.2*  HCT 24.7* 24.1* 25.0* 29.7* 28.6*  MCV 89.2 88.6 89.6 88.4 90.8  PLT 34* 25* 23* 22* 31*    CBG:  Recent Labs Lab 10/10/14 2007 10/11/14 0005 10/11/14 0415 10/11/14 0808 10/11/14 1132  GLUCAP 127* 83 79 101* 119*       Signed:  Chelli Yerkes  [facility] 10/11/2014, 3:42 PM

## 2014-10-11 NOTE — Progress Notes (Signed)
[name]  MRN: [id number]  DOB/AGE: [birth year] 62 y.o.  Primary Care Physician:[name], Betsy Coder, MD  Admit date: 09/26/2014  Chief Complaint:  Chief Complaint  Patient presents with  . Abnormal Lab    S-Pt presented on  09/26/2014 with  Chief Complaint  Patient presents with  . Abnormal Lab  .    Pt today feels better ,offers no new concerns.     Med . feeding supplement (ENSURE ENLIVE)  237 mL Oral BID BM  . feeding supplement (PRO-STAT SUGAR FREE 64)  30 mL Oral BID  . folic acid  1 mg Oral Daily  . furosemide  20 mg Intravenous BID  . hydrocortisone cream  1 application Topical BID  . insulin aspart  0-9 Units Subcutaneous 6 times per day  . lidocaine  1 application Urethral Once  . magnesium oxide  400 mg Oral Daily  . pantoprazole  40 mg Oral Daily  . sodium bicarbonate  1,300 mg Oral TID     Physical Exam: Vital signs in last 24 hours: Temp:  [97.8 F (36.6 C)-98.8 F (37.1 C)] 98.8 F (37.1 C) (05/14 0523) Pulse Rate:  [91-106] 106 (05/14 0523) Resp:  [18] 18 (05/14 0523) BP: (122-128)/(55-58) 128/55 mmHg (05/14 0523) SpO2:  [97 %-99 %] 97 % (05/14 0523) Weight:  [218 lb 4.8 oz (99.02 kg)] 218 lb 4.8 oz (99.02 kg) (05/14 0523) Weight change:  Last BM Date: 10/09/14 (Ileostomy)  Intake/Output from previous day: 05/13 0701 - 05/14 0700 In: 480 [P.O.:480] Out: 1025 [Urine:925; Stool:100]     Physical Exam: General- pt is awake,alert, oriented to time place and person Resp- No acute REsp distress, NO Rhonchi CVS- S1S2 regular in rate and rhythm GIT- BS+, soft, NT, ND EXT- 1+ LE Edema, NO Cyanosis GU- Foley cath in situ, hematuria present.  Lab Results: CBC  Recent Labs  10/10/14 0633  WBC 8.6  HGB 9.2*  HCT 28.6*  PLT 31*    BMET  Recent Labs  10/10/14 0633 10/11/14 0645  NA 139 137  K 4.4 4.7  CL 108 105  CO2 25 25  GLUCOSE 120* 110*  BUN 64* 67*  CREATININE 2.94* 3.13*  CALCIUM 7.6* 7.7*   Creat Trend         Stent placed     Bladder clots    Foley irrigated                   New baseline vs BP on lower side 2016   4.28=>3.39=>2.63    =>5.5=>              4.15         ==> 3.61=>3.23=>2.94--3.13 2015   1.8--18.4 ( AKI) 2014  1.5--2.78 ( AKI) 2012  1.3--5.4 ( 3 episodes of AKI ) 2008 0.9--3.76 ( AKI)   MICRO No results found for this or any previous visit (from the past 240 hour(s)).    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                Progression of CKD  Marked with multiple AKI              2)HTN BP  stable Medications On Diuretics   3)Anemia sec to  Oncological + Bleeding issues S/P PRBC transfusion    4)haeam/ Oncology- Hx of Metastatic rectal Adenocarcinoma Thrombocytopnenia Oncology following S/p BM biopsy results do not show any metastatic ds.Marland Kitchen  5)Urology admitted with b/l Hydronephrosis S/sp B/L stent Blood clots in bladder Urology and  Primary MD following  6)Electrolytes Hypokalemic     Now better NOrmonatremic   7)Acid base Co2 at goal now Pt has NON Anion gap Metabolic Acidosis sec to AKI on CKD and IV Fluids Pt continues to be On PO Bicarb    Plan:  Will  Change Diuretics to PO. If d/c pt will need outpt pcp,oncology, nephrology and urology follow up.     Lawrencia Mauney S 10/11/2014, 10:59 AM

## 2014-10-13 ENCOUNTER — Inpatient Hospital Stay (HOSPITAL_COMMUNITY)
Admission: EM | Admit: 2014-10-13 | Discharge: 2014-10-15 | DRG: 683 | Disposition: A | Discharge status: Hospice | Payer: Managed Care, Other (non HMO) | Attending: Internal Medicine | Admitting: Internal Medicine

## 2014-10-13 ENCOUNTER — Telehealth (HOSPITAL_COMMUNITY): Payer: Self-pay | Admitting: *Deleted

## 2014-10-13 ENCOUNTER — Inpatient Hospital Stay (HOSPITAL_COMMUNITY): Payer: Managed Care, Other (non HMO)

## 2014-10-13 ENCOUNTER — Encounter (HOSPITAL_COMMUNITY): Payer: Self-pay

## 2014-10-13 DIAGNOSIS — N390 Urinary tract infection, site not specified: Secondary | ICD-10-CM | POA: Diagnosis present

## 2014-10-13 DIAGNOSIS — N1339 Other hydronephrosis: Secondary | ICD-10-CM

## 2014-10-13 DIAGNOSIS — E860 Dehydration: Secondary | ICD-10-CM | POA: Diagnosis present

## 2014-10-13 DIAGNOSIS — Z79899 Other long term (current) drug therapy: Secondary | ICD-10-CM

## 2014-10-13 DIAGNOSIS — K7460 Unspecified cirrhosis of liver: Secondary | ICD-10-CM | POA: Diagnosis present

## 2014-10-13 DIAGNOSIS — N1330 Unspecified hydronephrosis: Secondary | ICD-10-CM | POA: Diagnosis present

## 2014-10-13 DIAGNOSIS — I129 Hypertensive chronic kidney disease with stage 1 through stage 4 chronic kidney disease, or unspecified chronic kidney disease: Secondary | ICD-10-CM | POA: Diagnosis present

## 2014-10-13 DIAGNOSIS — R17 Unspecified jaundice: Secondary | ICD-10-CM

## 2014-10-13 DIAGNOSIS — Z95828 Presence of other vascular implants and grafts: Secondary | ICD-10-CM

## 2014-10-13 DIAGNOSIS — E119 Type 2 diabetes mellitus without complications: Secondary | ICD-10-CM | POA: Diagnosis present

## 2014-10-13 DIAGNOSIS — C7801 Secondary malignant neoplasm of right lung: Secondary | ICD-10-CM | POA: Diagnosis present

## 2014-10-13 DIAGNOSIS — D696 Thrombocytopenia, unspecified: Secondary | ICD-10-CM | POA: Diagnosis present

## 2014-10-13 DIAGNOSIS — R627 Adult failure to thrive: Secondary | ICD-10-CM | POA: Diagnosis present

## 2014-10-13 DIAGNOSIS — N183 Chronic kidney disease, stage 3 (moderate): Secondary | ICD-10-CM | POA: Diagnosis present

## 2014-10-13 DIAGNOSIS — D638 Anemia in other chronic diseases classified elsewhere: Secondary | ICD-10-CM | POA: Diagnosis present

## 2014-10-13 DIAGNOSIS — D61818 Other pancytopenia: Secondary | ICD-10-CM | POA: Diagnosis present

## 2014-10-13 DIAGNOSIS — N170 Acute kidney failure with tubular necrosis: Principal | ICD-10-CM | POA: Diagnosis present

## 2014-10-13 DIAGNOSIS — E8809 Other disorders of plasma-protein metabolism, not elsewhere classified: Secondary | ICD-10-CM | POA: Diagnosis present

## 2014-10-13 DIAGNOSIS — I2510 Atherosclerotic heart disease of native coronary artery without angina pectoris: Secondary | ICD-10-CM | POA: Diagnosis present

## 2014-10-13 DIAGNOSIS — Z66 Do not resuscitate: Secondary | ICD-10-CM | POA: Diagnosis present

## 2014-10-13 DIAGNOSIS — Z961 Presence of intraocular lens: Secondary | ICD-10-CM | POA: Diagnosis present

## 2014-10-13 DIAGNOSIS — Z87891 Personal history of nicotine dependence: Secondary | ICD-10-CM

## 2014-10-13 DIAGNOSIS — Z515 Encounter for palliative care: Secondary | ICD-10-CM | POA: Diagnosis not present

## 2014-10-13 DIAGNOSIS — Z85118 Personal history of other malignant neoplasm of bronchus and lung: Secondary | ICD-10-CM

## 2014-10-13 DIAGNOSIS — C7802 Secondary malignant neoplasm of left lung: Secondary | ICD-10-CM | POA: Diagnosis present

## 2014-10-13 DIAGNOSIS — C20 Malignant neoplasm of rectum: Secondary | ICD-10-CM | POA: Diagnosis present

## 2014-10-13 DIAGNOSIS — I8500 Esophageal varices without bleeding: Secondary | ICD-10-CM | POA: Diagnosis present

## 2014-10-13 DIAGNOSIS — Z951 Presence of aortocoronary bypass graft: Secondary | ICD-10-CM | POA: Diagnosis not present

## 2014-10-13 DIAGNOSIS — R531 Weakness: Secondary | ICD-10-CM

## 2014-10-13 DIAGNOSIS — I252 Old myocardial infarction: Secondary | ICD-10-CM | POA: Diagnosis not present

## 2014-10-13 DIAGNOSIS — Z923 Personal history of irradiation: Secondary | ICD-10-CM

## 2014-10-13 DIAGNOSIS — Z9889 Other specified postprocedural states: Secondary | ICD-10-CM

## 2014-10-13 DIAGNOSIS — Z9842 Cataract extraction status, left eye: Secondary | ICD-10-CM

## 2014-10-13 DIAGNOSIS — N179 Acute kidney failure, unspecified: Secondary | ICD-10-CM | POA: Diagnosis not present

## 2014-10-13 DIAGNOSIS — Z9841 Cataract extraction status, right eye: Secondary | ICD-10-CM | POA: Diagnosis not present

## 2014-10-13 DIAGNOSIS — R161 Splenomegaly, not elsewhere classified: Secondary | ICD-10-CM | POA: Diagnosis not present

## 2014-10-13 DIAGNOSIS — K219 Gastro-esophageal reflux disease without esophagitis: Secondary | ICD-10-CM | POA: Diagnosis present

## 2014-10-13 DIAGNOSIS — C787 Secondary malignant neoplasm of liver and intrahepatic bile duct: Secondary | ICD-10-CM | POA: Diagnosis present

## 2014-10-13 LAB — COMPREHENSIVE METABOLIC PANEL
ALT: 38 U/L (ref 17–63)
AST: 61 U/L — AB (ref 15–41)
Albumin: 1.6 g/dL — ABNORMAL LOW (ref 3.5–5.0)
Alkaline Phosphatase: 122 U/L (ref 38–126)
Anion gap: 9 (ref 5–15)
BUN: 80 mg/dL — ABNORMAL HIGH (ref 6–20)
CALCIUM: 8 mg/dL — AB (ref 8.9–10.3)
CO2: 25 mmol/L (ref 22–32)
Chloride: 108 mmol/L (ref 101–111)
Creatinine, Ser: 3.97 mg/dL — ABNORMAL HIGH (ref 0.61–1.24)
GFR calc Af Amer: 17 mL/min — ABNORMAL LOW (ref >60)
GFR calc non Af Amer: 15 mL/min — ABNORMAL LOW (ref >60)
Glucose, Bld: 118 mg/dL — ABNORMAL HIGH (ref 65–99)
POTASSIUM: 4.6 mmol/L (ref 3.5–5.1)
Sodium: 142 mmol/L (ref 135–145)
Total Bilirubin: 5.2 mg/dL — ABNORMAL HIGH (ref 0.3–1.2)
Total Protein: 6.7 g/dL (ref 6.5–8.1)

## 2014-10-13 LAB — BILIRUBIN, DIRECT: Bilirubin, Direct: 2.9 mg/dL — ABNORMAL HIGH (ref 0.1–0.5)

## 2014-10-13 LAB — URINALYSIS, ROUTINE W REFLEX MICROSCOPIC
GLUCOSE, UA: 100 mg/dL — AB
Ketones, ur: 15 mg/dL — AB
Nitrite: POSITIVE — AB
Specific Gravity, Urine: 1.01 (ref 1.005–1.030)
Urobilinogen, UA: 4 mg/dL — ABNORMAL HIGH (ref 0.0–1.0)

## 2014-10-13 LAB — CBC WITH DIFFERENTIAL/PLATELET
BASOS ABS: 0 10*3/uL (ref 0.0–0.1)
Basophils Relative: 0 % (ref 0–1)
EOS ABS: 0 10*3/uL (ref 0.0–0.7)
Eosinophils Relative: 0 % (ref 0–5)
HCT: 25.4 % — ABNORMAL LOW (ref 39.0–52.0)
HEMOGLOBIN: 8.4 g/dL — AB (ref 13.0–17.0)
LYMPHS ABS: 0.3 10*3/uL — AB (ref 0.7–4.0)
Lymphocytes Relative: 3 % — ABNORMAL LOW (ref 12–46)
MCH: 29.9 pg (ref 26.0–34.0)
MCHC: 33.1 g/dL (ref 30.0–36.0)
MCV: 90.4 fL (ref 78.0–100.0)
Monocytes Absolute: 0.4 10*3/uL (ref 0.1–1.0)
Monocytes Relative: 5 % (ref 3–12)
Neutro Abs: 8.3 10*3/uL — ABNORMAL HIGH (ref 1.7–7.7)
Neutrophils Relative %: 92 % — ABNORMAL HIGH (ref 43–77)
PLATELETS: 43 10*3/uL — AB (ref 150–400)
RBC: 2.81 MIL/uL — ABNORMAL LOW (ref 4.22–5.81)
RDW: 17 % — ABNORMAL HIGH (ref 11.5–15.5)
WBC: 9 10*3/uL (ref 4.0–10.5)

## 2014-10-13 LAB — PROTIME-INR
INR: 1.54 — ABNORMAL HIGH (ref 0.00–1.49)
Prothrombin Time: 18.7 seconds — ABNORMAL HIGH (ref 11.6–15.2)

## 2014-10-13 LAB — TSH: TSH: 1.377 u[IU]/mL (ref 0.350–4.500)

## 2014-10-13 LAB — URINE MICROSCOPIC-ADD ON

## 2014-10-13 LAB — TROPONIN I

## 2014-10-13 LAB — LIPASE, BLOOD: LIPASE: 47 U/L (ref 22–51)

## 2014-10-13 MED ORDER — OXYCODONE HCL 5 MG PO TABS
5.0000 mg | ORAL_TABLET | ORAL | Status: DC | PRN
  Administered 2014-10-13 – 2014-10-14 (×3): 5 mg via ORAL
  Filled 2014-10-13 (×3): qty 1

## 2014-10-13 MED ORDER — FOLIC ACID 1 MG PO TABS
1.0000 mg | ORAL_TABLET | Freq: Every day | ORAL | Status: DC
  Administered 2014-10-13: 1 mg via ORAL
  Filled 2014-10-13 (×2): qty 1

## 2014-10-13 MED ORDER — HYDROCODONE-ACETAMINOPHEN 5-325 MG PO TABS
1.0000 | ORAL_TABLET | Freq: Once | ORAL | Status: AC
  Administered 2014-10-13: 1 via ORAL
  Filled 2014-10-13: qty 1

## 2014-10-13 MED ORDER — SODIUM CHLORIDE 0.9 % IV SOLN
INTRAVENOUS | Status: DC
  Administered 2014-10-13 – 2014-10-15 (×4): via INTRAVENOUS

## 2014-10-13 MED ORDER — MAGNESIUM OXIDE 400 (241.3 MG) MG PO TABS
400.0000 mg | ORAL_TABLET | Freq: Every day | ORAL | Status: DC
  Administered 2014-10-13: 400 mg via ORAL
  Filled 2014-10-13 (×2): qty 1

## 2014-10-13 MED ORDER — PANTOPRAZOLE SODIUM 40 MG PO TBEC
40.0000 mg | DELAYED_RELEASE_TABLET | Freq: Every day | ORAL | Status: DC
Start: 2014-10-13 — End: 2014-10-15
  Administered 2014-10-13: 40 mg via ORAL
  Filled 2014-10-13 (×2): qty 1

## 2014-10-13 MED ORDER — DEXTROSE 5 % IV SOLN
1.0000 g | INTRAVENOUS | Status: DC
  Administered 2014-10-13 – 2014-10-14 (×2): 1 g via INTRAVENOUS
  Filled 2014-10-13 (×3): qty 10

## 2014-10-13 MED ORDER — SODIUM CHLORIDE 0.9 % IV BOLUS (SEPSIS)
500.0000 mL | Freq: Once | INTRAVENOUS | Status: AC
  Administered 2014-10-13: 500 mL via INTRAVENOUS

## 2014-10-13 NOTE — ED Notes (Signed)
Reports has been out of hospital for 3 days

## 2014-10-13 NOTE — ED Notes (Signed)
Pt states he is not taking in fluids and has not been able to urinate the past couple of days

## 2014-10-13 NOTE — ED Notes (Signed)
Pt arrived with foley and colostomy bag

## 2014-10-13 NOTE — Telephone Encounter (Signed)
Hospice versus Dialysis is correct because his renal function is so poor.  Hold Cetuximab on Wednesday.  I think this conversation needs to take place face-to-face on Wednesday, not over the phone because it is a very serious decision.  Chrishon Martino

## 2014-10-13 NOTE — H&P (Signed)
[facility] History and Physical  [name] QQI:[id number] DOB: 1950/08/07    PCP:   [name], MD   Chief Complaint: weakness and failure to thrive.   HPI: [name] is an 64 y.o. male whom I discharged home a couple of days ago, after being admitted for AKI, pancytopenia, obstructive uropathy s/p bilateral stent placement, with improvement of his Cr to a value of 3 upon discharge, brought to the ER again as he felt weak and unable to eat or drink.  He has lost his appetite.  He did not have any fever, chills, chest pain or SOB.  He has the ostomy which put out liquids, and it has not changed.  Evalaution in the ER showed a total bili of 5.6, with Cr of 4.0, and Hb of 8.4 g per dL, with unremarkable LFTs.  The direct total bili was elevated at 2.6.  He has been taking his lasix. His UA remained with 11-20 WBCs.  Hospitalist was asked to admit him for jaundice, AKI, volume depletion, and failure to thrive.   Rewiew of Systems:  Constitutional: Negative for malaise, fever and chills. No significant weight loss or weight gain Eyes: Negative for eye pain, redness and discharge, diplopia, visual changes, or flashes of light. ENMT: Negative for ear pain, hoarseness, nasal congestion, sinus pressure and sore throat. No headaches; tinnitus, drooling, or problem swallowing. Cardiovascular: Negative for chest pain, palpitations, diaphoresis, dyspnea and peripheral edema. ; No orthopnea, PND Respiratory: Negative for cough, hemoptysis, wheezing and stridor. No pleuritic chestpain. Gastrointestinal: Negative for nausea, vomiting, diarrhea, constipation, abdominal pain, melena, blood in stool, hematemesis, jaundice and rectal bleeding.  He has an ostomy bag with brown liquid.  Genitourinary: Negative for frequency, dysuria, incontinence,flank pain and hematuria; Musculoskeletal: Negative for back pain and neck pain. Negative for swelling and trauma.;  Skin: . Negative for  pruritus, rash, abrasions, bruising and skin lesion.; ulcerations Neuro: Negative for headache, lightheadedness and neck stiffness. Negative for weakness, altered level of consciousness , altered mental status, extremity weakness, burning feet, involuntary movement, seizure and syncope.  Psych: negative for anxiety, depression, insomnia, tearfulness, panic attacks, hallucinations, paranoia, suicidal or homicidal ideation    Past Medical History  Diagnosis Date  . Pancytopenia, acquired 12/04/2010  . B12 deficiency 12/07/2010  . Kidney stones   . CAD (coronary artery disease)   . Anemia   . History of radiation therapy 06/19/07 -07/27/07    whole pelvis  . Hx of radiation therapy 08/06/12,08/09/12,& 08/13/12    rul 54GY/60f  . Cancer     Colon ca dx 2008 surg/rad/chemo  . Lung cancer   . FH: chemotherapy   . Rectal bleeding 09/20/2013    From recurrent rectal mass/recurrent rectal adenocarcinoma  . Acute blood loss anemia 09/27/2013  . 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  2.7 cm primary, moderately differentiated with 3/8 positive lymph nodes without LVI.  Surgery was on 04/22/2007 and was treated postoperatively with radiation and concomitant capecitabine and then 4 cycles of a planned 6 with oxaliplatin and capecitabine.  His counts would not allow treatment of  . Hydronephrosis of left kidney 09/24/2013    Kidney stones; s/p cystoscopy and stent  . Gross hematuria 09/25/2103  . Lung metastases 07/09/2013  . Secondary malignancy of right lung 07/09/2013  . Insomnia 06/09/2014  . Foley catheter in place 07/02/14  . Broken  collarbone feb 2016  . Myocardial infarction     2003   . Heart murmur   . Sleep apnea     hx of cpap no longer wears   . Diabetes mellitus without complication     hx of no longer on meds   . Hypertension     hx of   . Shortness of breath dyspnea     due to radiaiton  treatment   . GERD (gastroesophageal reflux disease)   . Bilateral hydronephrosis 09/27/2014    Past Surgical History  Procedure Laterality Date  . Ileostomy  12/07/2010    Procedure: ILEOSTOMY;  Surgeon: Donato Heinz;  Location: [facility];  Service: General;  Laterality: N/A;  Diverting Ileostomy Lysis of adhesions Exploratory Laparotomy  . Lung biopsy  10/27/10    rt lobe- adenocarcinoma  . Cholecystectomy    . Cataract extraction w/ intraocular lens implant  2007    bil  . Lithotripsy  2002  . Coronary angioplasty with stent placement  2003    stenting x 2  . Colon resection  2008    x2  . Esophagogastroduodenoscopy N/A 05/02/2013    Dr. Gala Romney- polypoid antral fold, bx= reactive gastropathy  . Givens capsule study N/A 05/02/2013    Procedure: GIVENS CAPSULE STUDY;  Surgeon: Daneil Dolin, MD;  Location: [facility];  Service: Endoscopy;  Laterality: N/A;  . Colonoscopy N/A 09/20/2013    SLF:A near circumferential fungating mass with friable surfaces was found in the rectum.  ACTIVELY OOZING.  CLOTS SEEN IN LUMEN AND ASPIRATED.  Multiple biopsies were performed using cold forceps.  Thermal therapy(BICAP 7 Fr 25W) was used to ATTEMPT TO control bleeding.    HEMOSTASIS NOT ACHEIVED.  Marland Kitchen Cystoscopy w/ ureteral stent placement Left 09/24/2013    Procedure: CYSTOSCOPY WITH LEFT RETROGRADE PYELOGRAM; LEFT URETERAL STENT PLACEMENT; REMOVAL OF SMALL BLADDER CALCULI;  Surgeon: Marissa Nestle, MD;  Location: [facility];  Service: Urology;  Laterality: Left;  . Flexible sigmoidoscopy N/A 09/25/2013    DQQ:[id number] exophytic rectal mass most likely representing recurrent adenocarcinoma status post biopsy  . Cystoscopy with retrograde pyelogram, ureteroscopy and stent placement Left 05/08/2014    Procedure: CYSTOSCOPY/ EVACUATION OF CLOTS  WITH LEFT RETROGRADE PYELOGRAM,LEFT URETEROSCOPY ;  Surgeon: Malka So, MD;  Location: [facility];  Service: Urology;  Laterality: Left;  . Nephrolithotomy Left  05/13/2014    Procedure: LEFT NEPHROLITHOTOMY PERCUTANEOUS FIRST STAGE;  Surgeon: Malka So, MD;  Location: [facility];  Service: Urology;  Laterality: Left;  . Holmium laser application Left 19/41/7408    Procedure: HOLMIUM LASER APPLICATION;  Surgeon: Malka So, MD;  Location: [facility];  Service: Urology;  Laterality: Left;  . Cystoscopy w/ ureteral stent placement Bilateral 09/26/2014    Procedure: CYSTOSCOPY WITH CLOT EVACUATION; LEFT URETERAL STENT REMOVAL; BILATERAL RETROGRADE PYELOGRAM; BILATERAL URETERAL STENT PLACEMENT;  Surgeon: Alexis Frock, MD;  Location: [facility];  Service: Urology;  Laterality: Bilateral;    Medications:  HOME MEDS: Prior to Admission medications   Medication Sig Start Date End Date Taking? Authorizing Provider  cyanocobalamin (,VITAMIN B-12,) 1000 MCG/ML injection Inject 1,000 mcg into the muscle every 30 (thirty) days.    Yes Historical Provider, MD  folic acid (FOLVITE) 1 MG tablet Take 1 tablet (1 mg total) by mouth daily. 04/28/14  Yes Baird Cancer, PA-C  HYDROcodone-acetaminophen (NORCO) 7.5-325 MG per tablet Take 1 tablet by mouth every 6 (six) hours as needed for moderate pain. 09/15/14  Yes Baird Cancer, PA-C  hydrocortisone cream 1 % Apply 1 application topically 2 (two) times daily. Mix with urea cream and apply to affected areas. 04/23/14  Yes Baird Cancer, PA-C  lidocaine-prilocaine (EMLA) cream Apply a quarter size amount to port site 1 hour prior to chemo. Do not rub in. Cover with plastic wrap. 03/13/14  Yes Manon Hilding Kefalas, PA-C  magnesium oxide (MAG-OX) 400 (241.3 MG) MG tablet Take 1 tablet (400 mg total) by mouth daily. Patient taking differently: Take 400 mg by mouth daily. Patient takes at [facility] 09/08/14  Yes Patrici Ranks, MD  omeprazole (PRILOSEC) 20 MG capsule Take 1 capsule (20 mg total) by mouth daily. 09/28/13  Yes Rexene Alberts, MD  furosemide (LASIX) 40 MG tablet Take 1 tablet (40 mg total) by mouth daily. Patient not  taking: Reported on 10/13/2014 10/11/14   Orvan Falconer, MD  sodium bicarbonate 650 MG tablet Take 2 tablets (1,300 mg total) by mouth 3 (three) times daily. Patient not taking: Reported on 10/13/2014 10/11/14   Orvan Falconer, MD     Allergies:  Allergies  Allergen Reactions  . Daypro [Oxaprozin] Nausea And Vomiting  . Xeloda [Capecitabine] Other (See Comments)    Homocidal and suicidal ideations    Social History:   reports that he has quit smoking. His smoking use included Cigarettes and Cigars. He has a 60 pack-year smoking history. He quit smokeless tobacco use about 9 years ago. His smokeless tobacco use included Chew. He reports that he does not drink alcohol or use illicit drugs.  Family History: Family History  Problem Relation Age of Onset  . Cervical cancer Mother   . Rectal cancer Father 75  . Uterine cancer Sister      Physical Exam: Filed Vitals:   10/13/14 1400 10/13/14 1430 10/13/14 1530 10/13/14 1600  BP: 124/62 113/48 113/54 117/55  Pulse: 91 89 90 88  Temp:      TempSrc:      Resp: 16 17 17 16   Height:      Weight:      SpO2: 97% 94% 95% 95%   Blood pressure 117/55, pulse 88, temperature 98.6 F (37 C), temperature source Oral, resp. rate 16, height 5' 10"  (1.778 m), weight 95.255 kg (210 lb), SpO2 95 %.  GEN:  Pleasant  patient lying in the stretcher in no acute distress; cooperative with exam. PSYCH:  alert and oriented x4; does not appear anxious or depressed; affect is appropriate. HEENT: Mucous membranes pink and anicteric; PERRLA; EOM intact; no cervical lymphadenopathy nor thyromegaly or carotid bruit; no JVD; There were no stridor. Neck is very supple. Breasts:: Not examined CHEST WALL: No tenderness.  Portacath on the left chest wall.  CHEST: Normal respiration, clear to auscultation bilaterally.  HEART: Regular rate and rhythm.  There are no murmur, rub, or gallops.   BACK: No kyphosis or scoliosis; no CVA tenderness ABDOMEN: soft and non-tender; no  masses, no organomegaly, normal abdominal bowel sounds; no pannus; no intertriginous candida. There is no rebound and no distention. Rectal Exam: Not done EXTREMITIES: No bone or joint deformity; age-appropriate arthropathy of the hands and knees; no edema; no ulcerations.  There is no calf tenderness. Genitalia: not examined PULSES: 2+ and symmetric SKIN: Normal hydration no rash or ulceration CNS: Cranial nerves 2-12 grossly intact no focal lateralizing neurologic deficit.  Speech is fluent; uvula elevated with phonation, facial symmetry and tongue midline. DTR are normal bilaterally, cerebella exam is intact, barbinski is negative  and strengths are equaled bilaterally.  No sensory loss.   Labs on Admission:  Basic Metabolic Panel:  Recent Labs Lab 10/08/14 0605 10/09/14 0606 10/10/14 0633 10/11/14 0645 10/13/14 1255  NA 141 140 139 137 142  K 3.6 4.0 4.4 4.7 4.6  CL 108 108 108 105 108  CO2 26 26 25 25 25   GLUCOSE 106* 116* 120* 110* 118*  BUN 70* 69* 64* 67* 80*  CREATININE 3.61* 3.23* 2.94* 3.13* 3.97*  CALCIUM 7.6* 7.7* 7.6* 7.7* 8.0*   Liver Function Tests:  Recent Labs Lab 10/13/14 1255  AST 61*  ALT 38  ALKPHOS 122  BILITOT 5.2*  PROT 6.7  ALBUMIN 1.6*    Recent Labs Lab 10/13/14 1255  LIPASE 47   No results for input(s): AMMONIA in the last 168 hours. CBC:  Recent Labs Lab 10/07/14 0653 10/08/14 0605 10/10/14 0633 10/13/14 1255  WBC 6.7 6.6 8.6 9.0  NEUTROABS  --   --  7.7 8.3*  HGB 8.4* 10.0* 9.2* 8.4*  HCT 25.0* 29.7* 28.6* 25.4*  MCV 89.6 88.4 90.8 90.4  PLT 23* 22* 31* 43*   Cardiac Enzymes:  Recent Labs Lab 10/13/14 1255  TROPONINI <0.03    CBG:  Recent Labs Lab 10/10/14 2007 10/11/14 0005 10/11/14 0415 10/11/14 0808 10/11/14 1132  GLUCAP 127* 83 79 101* 119*    Assessment/Plan Present on Admission:  . Acute kidney injury . Bilateral hydronephrosis . Hydronephrosis . Pancytopenia . Recurrent UTI .  Thrombocytopenia  PLAN:  Patient again has AKI, and will obtain a renal US to reassess his hydronephrosis.  Will consult urology to see if anything more needs to be done.  WIll check his Cr daily and let nephrology know of his readmission.  He will need to have IVF, and will hold his lasix, as I think he is volume depleted again.  He is not uremic, so I am not sure why his appetite is gone.  He is not on any medication that suppressed his appetite.  We will follow.  For his UTI, will give Rocephin IV.  For his elevation of conjugated total bilirubin, will check a RUQ [place], though he doesn't have any abdominal pain.  Also, his liver Fx tests were not elevated.  Another problem is his pancytopenia, yet he is not bleeding.   He had his bone marrow biopsy, and I will consult heme onc for follow up.  Will hold his SQ heparin DVT prophylaxis, given his severe thrombocytopenia.  He is stable, full code, and will be admitted to hospitalist service.  Thank you for allowing me to participate in his care.   Other plans as per orders.  Code Status: FULL Haskel Khan, MD. [facility] Pager [phone] 7pm to 7am.  10/13/2014, 4:34 PM

## 2014-10-13 NOTE — ED Provider Notes (Signed)
CSN: [id number]     Arrival date & time 10/13/14  1116 History   First MD Initiated Contact with Patient 10/13/14 1310     Chief Complaint  Patient presents with  . Anorexia     (Consider location/radiation/quality/duration/timing/severity/associated sxs/prior Treatment) Patient is a 64 y.o. male presenting with weakness. The history is provided by the patient (pt has been out of the hospital and has not been eating or drinking).  Weakness This is a recurrent problem. The current episode started more than 2 days ago. The problem occurs constantly. The problem has not changed since onset.Pertinent negatives include no chest pain, no abdominal pain and no headaches. Nothing aggravates the symptoms. Nothing relieves the symptoms.    Past Medical History  Diagnosis Date  . Pancytopenia, acquired 12/04/2010  . B12 deficiency 12/07/2010  . Kidney stones   . CAD (coronary artery disease)   . Anemia   . History of radiation therapy 06/19/07 -07/27/07    whole pelvis  . Hx of radiation therapy 08/06/12,08/09/12,& 08/13/12    rul 54GY/27f  . Cancer     Colon ca dx 2008 surg/rad/chemo  . Lung cancer   . FH: chemotherapy   . Rectal bleeding 09/20/2013    From recurrent rectal mass/recurrent rectal adenocarcinoma  . Acute blood loss anemia 09/27/2013  . 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  2.7 cm primary, moderately differentiated with 3/8 positive lymph nodes without LVI.  Surgery was on 04/22/2007 and was treated postoperatively with radiation and concomitant capecitabine and then 4 cycles of a planned 6 with oxaliplatin and capecitabine.  His counts would not allow treatment of  . Hydronephrosis of left kidney 09/24/2013    Kidney stones; s/p cystoscopy and stent  . Gross hematuria 09/25/2103  . Lung metastases 07/09/2013  . Secondary malignancy of right lung 07/09/2013  . Insomnia 06/09/2014  .  Foley catheter in place 07/02/14  . Broken collarbone feb 2016  . Myocardial infarction     2003   . Heart murmur   . Sleep apnea     hx of cpap no longer wears   . Diabetes mellitus without complication     hx of no longer on meds   . Hypertension     hx of   . Shortness of breath dyspnea     due to radiaiton treatment   . GERD (gastroesophageal reflux disease)   . Bilateral hydronephrosis 09/27/2014   Past Surgical History  Procedure Laterality Date  . Ileostomy  12/07/2010    Procedure: ILEOSTOMY;  Surgeon: BDonato Heinz  Location: [facility];  Service: General;  Laterality: N/A;  Diverting Ileostomy Lysis of adhesions Exploratory Laparotomy  . Lung biopsy  10/27/10    rt lobe- adenocarcinoma  . Cholecystectomy    . Cataract extraction w/ intraocular lens implant  2007    bil  . Lithotripsy  2002  . Coronary angioplasty with stent placement  2003    stenting x 2  . Colon resection  2008    x2  . Esophagogastroduodenoscopy N/A 05/02/2013    Dr. RGala Romney polypoid antral fold, bx= reactive gastropathy  . Givens capsule study N/A 05/02/2013    Procedure: GIVENS CAPSULE STUDY;  Surgeon: RDaneil Dolin MD;  Location: [facility];  Service: Endoscopy;  Laterality: N/A;  . Colonoscopy N/A 09/20/2013    SLF:A near circumferential fungating mass with friable  surfaces was found in the rectum.  ACTIVELY OOZING.  CLOTS SEEN IN LUMEN AND ASPIRATED.  Multiple biopsies were performed using cold forceps.  Thermal therapy(BICAP 7 Fr 25W) was used to ATTEMPT TO control bleeding.    HEMOSTASIS NOT ACHEIVED.  Marland Kitchen Cystoscopy w/ ureteral stent placement Left 09/24/2013    Procedure: CYSTOSCOPY WITH LEFT RETROGRADE PYELOGRAM; LEFT URETERAL STENT PLACEMENT; REMOVAL OF SMALL BLADDER CALCULI;  Surgeon: Marissa Nestle, MD;  Location: [facility];  Service: Urology;  Laterality: Left;  . Flexible sigmoidoscopy N/A 09/25/2013    DJS:[id number] exophytic rectal mass most likely representing recurrent adenocarcinoma  status post biopsy  . Cystoscopy with retrograde pyelogram, ureteroscopy and stent placement Left 05/08/2014    Procedure: CYSTOSCOPY/ EVACUATION OF CLOTS  WITH LEFT RETROGRADE PYELOGRAM,LEFT URETEROSCOPY ;  Surgeon: Malka So, MD;  Location: [facility];  Service: Urology;  Laterality: Left;  . Nephrolithotomy Left 05/13/2014    Procedure: LEFT NEPHROLITHOTOMY PERCUTANEOUS FIRST STAGE;  Surgeon: Malka So, MD;  Location: [facility];  Service: Urology;  Laterality: Left;  . Holmium laser application Left 88/50/2774    Procedure: HOLMIUM LASER APPLICATION;  Surgeon: Malka So, MD;  Location: [facility];  Service: Urology;  Laterality: Left;  . Cystoscopy w/ ureteral stent placement Bilateral 09/26/2014    Procedure: CYSTOSCOPY WITH CLOT EVACUATION; LEFT URETERAL STENT REMOVAL; BILATERAL RETROGRADE PYELOGRAM; BILATERAL URETERAL STENT PLACEMENT;  Surgeon: Alexis Frock, MD;  Location: [facility];  Service: Urology;  Laterality: Bilateral;   Family History  Problem Relation Age of Onset  . Cervical cancer Mother   . Rectal cancer Father 77  . Uterine cancer Sister    History  Substance Use Topics  . Smoking status: Former Smoker -- 2.00 packs/day for 30 years    Types: Cigarettes, Cigars  . Smokeless tobacco: Former Systems developer    Types: Chew    Quit date: 07/17/2005  . Alcohol Use: No     Comment: last drink 08/2013    Review of Systems  Constitutional: Negative for appetite change and fatigue.  HENT: Negative for congestion, ear discharge and sinus pressure.   Eyes: Negative for discharge.  Respiratory: Negative for cough.   Cardiovascular: Negative for chest pain.  Gastrointestinal: Negative for abdominal pain and diarrhea.  Genitourinary: Negative for frequency and hematuria.  Musculoskeletal: Negative for back pain.  Skin: Negative for rash.  Neurological: Positive for weakness. Negative for seizures and headaches.  Psychiatric/Behavioral: Negative for hallucinations.      Allergies   Daypro and Xeloda  Home Medications   Prior to Admission medications   Medication Sig Start Date End Date Taking? Authorizing Provider  cyanocobalamin (,VITAMIN B-12,) 1000 MCG/ML injection Inject 1,000 mcg into the muscle every 30 (thirty) days.    Yes Historical Provider, MD  folic acid (FOLVITE) 1 MG tablet Take 1 tablet (1 mg total) by mouth daily. 04/28/14  Yes Baird Cancer, PA-C  HYDROcodone-acetaminophen (NORCO) 7.5-325 MG per tablet Take 1 tablet by mouth every 6 (six) hours as needed for moderate pain. 09/15/14  Yes Baird Cancer, PA-C  hydrocortisone cream 1 % Apply 1 application topically 2 (two) times daily. Mix with urea cream and apply to affected areas. 04/23/14  Yes Baird Cancer, PA-C  lidocaine-prilocaine (EMLA) cream Apply a quarter size amount to port site 1 hour prior to chemo. Do not rub in. Cover with plastic wrap. 03/13/14  Yes Baird Cancer, PA-C  magnesium oxide (MAG-OX) 400 (241.3 MG) MG tablet Take 1 tablet (400  mg total) by mouth daily. Patient taking differently: Take 400 mg by mouth daily. Patient [facility] at [facility] 09/08/14  Yes Patrici Ranks, MD  omeprazole (PRILOSEC) 20 MG capsule Take 1 capsule (20 mg total) by mouth daily. 09/28/13  Yes Rexene Alberts, MD  furosemide (LASIX) 40 MG tablet Take 1 tablet (40 mg total) by mouth daily. Patient not taking: Reported on 10/13/2014 10/11/14   Orvan Falconer, MD  sodium bicarbonate 650 MG tablet Take 2 tablets (1,300 mg total) by mouth 3 (three) times daily. Patient not taking: Reported on 10/13/2014 10/11/14   Orvan Falconer, MD   BP 113/48 mmHg  Pulse 89  Temp(Src) 98.6 F (37 C) (Oral)  Resp 17  Ht '5\' 10"'$  (1.778 m)  Wt 210 lb (95.255 kg)  BMI 30.13 kg/m2  SpO2 94% Physical Exam  Constitutional: He is oriented to person, place, and time. He appears well-developed.  HENT:  Head: Normocephalic.  Eyes: Conjunctivae and EOM are normal. No scleral icterus.  Neck: Neck supple. No thyromegaly present.  Cardiovascular:  Normal rate and regular rhythm.  Exam reveals no gallop and no friction rub.   No murmur heard. Pulmonary/Chest: No stridor. He has no wheezes. He has no rales. He exhibits no tenderness.  Abdominal: He exhibits no distension. There is tenderness. There is no rebound.  Musculoskeletal: Normal range of motion. He exhibits no edema.  Lymphadenopathy:    He has no cervical adenopathy.  Neurological: He is oriented to person, place, and time. He exhibits normal muscle tone. Coordination normal.  Skin: No rash noted. No erythema.  Pt jaundice  Psychiatric: He has a normal mood and affect. His behavior is normal.    ED Course  Procedures (including critical care time) Labs Review Labs Reviewed  CBC WITH DIFFERENTIAL/PLATELET - Abnormal; Notable for the following:    RBC 2.81 (*)    Hemoglobin 8.4 (*)    HCT 25.4 (*)    RDW 17.0 (*)    Platelets 43 (*)    Neutrophils Relative % 92 (*)    Neutro Abs 8.3 (*)    Lymphocytes Relative 3 (*)    Lymphs Abs 0.3 (*)    All other components within normal limits  COMPREHENSIVE METABOLIC PANEL - Abnormal; Notable for the following:    Glucose, Bld 118 (*)    BUN 80 (*)    Creatinine, Ser 3.97 (*)    Calcium 8.0 (*)    Albumin 1.6 (*)    AST 61 (*)    Total Bilirubin 5.2 (*)    GFR calc non Af Amer 15 (*)    GFR calc Af Amer 17 (*)    All other components within normal limits  PROTIME-INR - Abnormal; Notable for the following:    Prothrombin Time 18.7 (*)    INR 1.54 (*)    All other components within normal limits  URINALYSIS, ROUTINE W REFLEX MICROSCOPIC - Abnormal; Notable for the following:    Color, Urine RED (*)    APPearance CLOUDY (*)    pH >9.0 (*)    Glucose, UA 100 (*)    Hgb urine dipstick LARGE (*)    Bilirubin Urine LARGE (*)    Ketones, ur 15 (*)    Protein, ur >300 (*)    Urobilinogen, UA 4.0 (*)    Nitrite POSITIVE (*)    Leukocytes, UA LARGE (*)    All other components within normal limits  URINE MICROSCOPIC-ADD  ON - Abnormal; Notable for the following:  Bacteria, UA MANY (*)    All other components within normal limits  LIPASE, BLOOD  TROPONIN I    Imaging Review No results found.   EKG Interpretation   Date/Time:  Monday Oct 13 2014 13:03:53 EDT Ventricular Rate:  90 PR Interval:  173 QRS Duration: 103 QT Interval:  396 QTC Calculation: 484 R Axis:   72 Text Interpretation:  Sinus rhythm Borderline T wave abnormalities  Borderline prolonged QT interval Confirmed by Lilyona Richner  MD, Brittani Purdum [phone])  on 10/13/2014 2:44:05 PM      MDM   Final diagnoses:  Dehydration  Total bilirubin, elevated    admit    Milton Ferguson, MD 10/13/14 1458

## 2014-10-14 ENCOUNTER — Inpatient Hospital Stay (HOSPITAL_COMMUNITY): Payer: Managed Care, Other (non HMO)

## 2014-10-14 ENCOUNTER — Other Ambulatory Visit (HOSPITAL_COMMUNITY): Payer: Self-pay | Admitting: Oncology

## 2014-10-14 DIAGNOSIS — K7460 Unspecified cirrhosis of liver: Secondary | ICD-10-CM

## 2014-10-14 DIAGNOSIS — C787 Secondary malignant neoplasm of liver and intrahepatic bile duct: Secondary | ICD-10-CM

## 2014-10-14 DIAGNOSIS — C7801 Secondary malignant neoplasm of right lung: Secondary | ICD-10-CM

## 2014-10-14 DIAGNOSIS — E8809 Other disorders of plasma-protein metabolism, not elsewhere classified: Secondary | ICD-10-CM

## 2014-10-14 DIAGNOSIS — R161 Splenomegaly, not elsewhere classified: Secondary | ICD-10-CM

## 2014-10-14 DIAGNOSIS — D72829 Elevated white blood cell count, unspecified: Secondary | ICD-10-CM

## 2014-10-14 DIAGNOSIS — C20 Malignant neoplasm of rectum: Secondary | ICD-10-CM

## 2014-10-14 LAB — COMPREHENSIVE METABOLIC PANEL
ALT: 39 U/L (ref 17–63)
AST: 69 U/L — ABNORMAL HIGH (ref 15–41)
Albumin: 1.5 g/dL — ABNORMAL LOW (ref 3.5–5.0)
Alkaline Phosphatase: 129 U/L — ABNORMAL HIGH (ref 38–126)
Anion gap: 10 (ref 5–15)
BILIRUBIN TOTAL: 5.5 mg/dL — AB (ref 0.3–1.2)
BUN: 85 mg/dL — AB (ref 6–20)
CALCIUM: 7.5 mg/dL — AB (ref 8.9–10.3)
CO2: 23 mmol/L (ref 22–32)
Chloride: 108 mmol/L (ref 101–111)
Creatinine, Ser: 4.51 mg/dL — ABNORMAL HIGH (ref 0.61–1.24)
GFR, EST AFRICAN AMERICAN: 15 mL/min — AB (ref >60)
GFR, EST NON AFRICAN AMERICAN: 13 mL/min — AB (ref >60)
Glucose, Bld: 87 mg/dL (ref 65–99)
Potassium: 4.9 mmol/L (ref 3.5–5.1)
Sodium: 141 mmol/L (ref 135–145)
Total Protein: 6.3 g/dL — ABNORMAL LOW (ref 6.5–8.1)

## 2014-10-14 LAB — CBC
HCT: 24.6 % — ABNORMAL LOW (ref 39.0–52.0)
Hemoglobin: 8.1 g/dL — ABNORMAL LOW (ref 13.0–17.0)
MCH: 29.7 pg (ref 26.0–34.0)
MCHC: 32.9 g/dL (ref 30.0–36.0)
MCV: 90.1 fL (ref 78.0–100.0)
Platelets: 43 10*3/uL — ABNORMAL LOW (ref 150–400)
RBC: 2.73 MIL/uL — AB (ref 4.22–5.81)
RDW: 17.1 % — AB (ref 11.5–15.5)
WBC: 15.4 10*3/uL — AB (ref 4.0–10.5)

## 2014-10-14 MED ORDER — MORPHINE SULFATE 2 MG/ML IJ SOLN
2.0000 mg | INTRAMUSCULAR | Status: DC | PRN
  Administered 2014-10-14 – 2014-10-15 (×12): 2 mg via INTRAVENOUS
  Filled 2014-10-14 (×12): qty 1

## 2014-10-14 MED ORDER — ONDANSETRON HCL 4 MG/2ML IJ SOLN
4.0000 mg | Freq: Four times a day (QID) | INTRAMUSCULAR | Status: DC | PRN
  Administered 2014-10-14: 4 mg via INTRAVENOUS
  Filled 2014-10-14: qty 2

## 2014-10-14 NOTE — Progress Notes (Signed)
[facility] PROGRESS NOTE  [name] YBW:[id number] DOB: 04-05-1951    PCP:   [name], MD   HPI: [name] is an 64 y.o. male with AKI, pancytopenia, jaundice, failure to thrive, UTI, admitted yesterday as he was feeling weak and not eating.  He is not doing well and is declining rapidly.  I think he is giving up.  His Cr rose to 4.5, and his RUQ and renal US is showing improved hydronephrosis on the right side, and is stable. RUQ US showed no biliary obstruction.  He said he feels better.   Rewiew of Systems:  Constitutional: Negative for malaise, fever and chills. No significant weight loss or weight gain Eyes: Negative for eye pain, redness and discharge, diplopia, visual changes, or flashes of light. ENMT: Negative for ear pain, hoarseness, nasal congestion, sinus pressure and sore throat. No headaches; tinnitus, drooling, or problem swallowing. Cardiovascular: Negative for chest pain, palpitations, diaphoresis, dyspnea and peripheral edema. ; No orthopnea, PND Respiratory: Negative for cough, hemoptysis, wheezing and stridor. No pleuritic chestpain. Gastrointestinal: Negative for nausea, vomiting, diarrhea, constipation, abdominal pain, melena, blood in stool, hematemesis, jaundice and rectal bleeding.    Genitourinary: Negative for frequency, dysuria, incontinence,flank pain and hematuria; Musculoskeletal: Negative for back pain and neck pain. Negative for swelling and trauma.;  Skin: . Negative for pruritus, rash, abrasions, bruising and skin lesion.; ulcerations Neuro: Negative for headache, lightheadedness and neck stiffness. Negative for weakness, altered level of consciousness , altered mental status, extremity weakness, burning feet, involuntary movement, seizure and syncope.  Psych: negative for anxiety, depression, insomnia, tearfulness, panic attacks, hallucinations, paranoia, suicidal or homicidal ideation    Past Medical History  Diagnosis Date   . Pancytopenia, acquired 12/04/2010  . B12 deficiency 12/07/2010  . Kidney stones   . CAD (coronary artery disease)   . Anemia   . History of radiation therapy 06/19/07 -07/27/07    whole pelvis  . Hx of radiation therapy 08/06/12,08/09/12,& 08/13/12    rul [id number]/68f  . Cancer     Colon ca dx 2008 surg/rad/chemo  . Lung cancer   . FH: chemotherapy   . Rectal bleeding 09/20/2013    From recurrent rectal mass/recurrent rectal adenocarcinoma  . Acute blood loss anemia 09/27/2013  . 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  2.7 cm primary, moderately differentiated with 3/8 positive lymph nodes without LVI.  Surgery was on 04/22/2007 and was treated postoperatively with radiation and concomitant capecitabine and then 4 cycles of a planned 6 with oxaliplatin and capecitabine.  His counts would not allow treatment of  . Hydronephrosis of left kidney 09/24/2013    Kidney stones; s/p cystoscopy and stent  . Gross hematuria 09/25/2103  . Lung metastases 07/09/2013  . Secondary malignancy of right lung 07/09/2013  . Insomnia 06/09/2014  . Foley catheter in place 07/02/14  . Broken collarbone feb 2016  . Myocardial infarction     2003   . Heart murmur   . Sleep apnea     hx of cpap no longer wears   . Diabetes mellitus without complication     hx of no longer on meds   . Hypertension     hx of   . Shortness of breath dyspnea     due to radiaiton treatment   . GERD (gastroesophageal reflux disease)   . Bilateral hydronephrosis 09/27/2014  Past Surgical History  Procedure Laterality Date  . Ileostomy  12/07/2010    Procedure: ILEOSTOMY;  Surgeon: Donato Heinz;  Location: [facility];  Service: General;  Laterality: N/A;  Diverting Ileostomy Lysis of adhesions Exploratory Laparotomy  . Lung biopsy  10/27/10    rt lobe- adenocarcinoma  . Cholecystectomy    . Cataract extraction w/ intraocular lens implant   2007    bil  . Lithotripsy  2002  . Coronary angioplasty with stent placement  2003    stenting x 2  . Colon resection  2008    x2  . Esophagogastroduodenoscopy N/A 05/02/2013    Dr. Gala Romney- polypoid antral fold, bx= reactive gastropathy  . Givens capsule study N/A 05/02/2013    Procedure: GIVENS CAPSULE STUDY;  Surgeon: Daneil Dolin, MD;  Location: [facility];  Service: Endoscopy;  Laterality: N/A;  . Colonoscopy N/A 09/20/2013    SLF:A near circumferential fungating mass with friable surfaces was found in the rectum.  ACTIVELY OOZING.  CLOTS SEEN IN LUMEN AND ASPIRATED.  Multiple biopsies were performed using cold forceps.  Thermal therapy(BICAP 7 Fr 25W) was used to ATTEMPT TO control bleeding.    HEMOSTASIS NOT ACHEIVED.  Marland Kitchen Cystoscopy w/ ureteral stent placement Left 09/24/2013    Procedure: CYSTOSCOPY WITH LEFT RETROGRADE PYELOGRAM; LEFT URETERAL STENT PLACEMENT; REMOVAL OF SMALL BLADDER CALCULI;  Surgeon: Marissa Nestle, MD;  Location: [facility];  Service: Urology;  Laterality: Left;  . Flexible sigmoidoscopy N/A 09/25/2013    PRF:[id number] exophytic rectal mass most likely representing recurrent adenocarcinoma status post biopsy  . Cystoscopy with retrograde pyelogram, ureteroscopy and stent placement Left 05/08/2014    Procedure: CYSTOSCOPY/ EVACUATION OF CLOTS  WITH LEFT RETROGRADE PYELOGRAM,LEFT URETEROSCOPY ;  Surgeon: Malka So, MD;  Location: [facility];  Service: Urology;  Laterality: Left;  . Nephrolithotomy Left 05/13/2014    Procedure: LEFT NEPHROLITHOTOMY PERCUTANEOUS FIRST STAGE;  Surgeon: Malka So, MD;  Location: [facility];  Service: Urology;  Laterality: Left;  . Holmium laser application Left 93/57/0177    Procedure: HOLMIUM LASER APPLICATION;  Surgeon: Malka So, MD;  Location: [facility];  Service: Urology;  Laterality: Left;  . Cystoscopy w/ ureteral stent placement Bilateral 09/26/2014    Procedure: CYSTOSCOPY WITH CLOT EVACUATION; LEFT URETERAL STENT REMOVAL; BILATERAL  RETROGRADE PYELOGRAM; BILATERAL URETERAL STENT PLACEMENT;  Surgeon: Alexis Frock, MD;  Location: [facility];  Service: Urology;  Laterality: Bilateral;    Medications:  HOME MEDS: Prior to Admission medications   Medication Sig Start Date End Date Taking? Authorizing Provider  cyanocobalamin (,VITAMIN B-12,) 1000 MCG/ML injection Inject 1,000 mcg into the muscle every 30 (thirty) days.    Yes Historical Provider, MD  folic acid (FOLVITE) 1 MG tablet Take 1 tablet (1 mg total) by mouth daily. 04/28/14  Yes Baird Cancer, PA-C  HYDROcodone-acetaminophen (NORCO) 7.5-325 MG per tablet Take 1 tablet by mouth every 6 (six) hours as needed for moderate pain. 09/15/14  Yes Baird Cancer, PA-C  hydrocortisone cream 1 % Apply 1 application topically 2 (two) times daily. Mix with urea cream and apply to affected areas. 04/23/14  Yes Baird Cancer, PA-C  lidocaine-prilocaine (EMLA) cream Apply a quarter size amount to port site 1 hour prior to chemo. Do not rub in. Cover with plastic wrap. 03/13/14  Yes Manon Hilding Kefalas, PA-C  magnesium oxide (MAG-OX) 400 (241.3 MG) MG tablet Take 1 tablet (400 mg total) by mouth daily. Patient taking differently: Take 400 mg  by mouth daily. Patient [facility] at [facility] 09/08/14  Yes Patrici Ranks, MD  omeprazole (PRILOSEC) 20 MG capsule Take 1 capsule (20 mg total) by mouth daily. 09/28/13  Yes Rexene Alberts, MD  furosemide (LASIX) 40 MG tablet Take 1 tablet (40 mg total) by mouth daily. Patient not taking: Reported on 10/13/2014 10/11/14   Orvan Falconer, MD  sodium bicarbonate 650 MG tablet Take 2 tablets (1,300 mg total) by mouth 3 (three) times daily. Patient not taking: Reported on 10/13/2014 10/11/14   Orvan Falconer, MD     Allergies:  Allergies  Allergen Reactions  . Daypro [Oxaprozin] Nausea And Vomiting  . Xeloda [Capecitabine] Other (See Comments)    Homocidal and suicidal ideations    Social History:   reports that he has quit smoking. His smoking use included  Cigarettes and Cigars. He has a 60 pack-year smoking history. He quit smokeless tobacco use about 9 years ago. His smokeless tobacco use included Chew. He reports that he does not drink alcohol or use illicit drugs.  Family History: Family History  Problem Relation Age of Onset  . Cervical cancer Mother   . Rectal cancer Father 70  . Uterine cancer Sister      Physical Exam: Filed Vitals:   10/13/14 1700 10/13/14 1745 10/13/14 2226 10/14/14 0621  BP: 113/58 116/56 149/62 118/54  Pulse: 89 89 126 115  Temp:  98.5 F (36.9 C) 100.1 F (37.8 C) 97.6 F (36.4 C)  TempSrc:  Axillary Oral Oral  Resp: '17  22 21  '$ Height:  '5\' 10"'$  (1.778 m)    Weight:  97.8 kg (215 lb 9.8 oz)    SpO2: 95% 99% 98% 98%   Blood pressure 118/54, pulse 115, temperature 97.6 F (36.4 C), temperature source Oral, resp. rate 21, height '5\' 10"'$  (1.778 m), weight 97.8 kg (215 lb 9.8 oz), SpO2 98 %.  GEN:  Pleasant  patient lying in the stretcher in no acute distress; cooperative with exam. PSYCH:  alert and oriented x4; does not appear anxious or depressed; affect is appropriate. HEENT: Mucous membranes pink and anicteric; PERRLA; EOM intact; no cervical lymphadenopathy nor thyromegaly or carotid bruit; no JVD; There were no stridor. Neck is very supple. Breasts:: Not examined CHEST WALL: No tenderness CHEST: Normal respiration, clear to auscultation bilaterally.  HEART: Regular rate and rhythm.  There are no murmur, rub, or gallops.   BACK: No kyphosis or scoliosis; no CVA tenderness ABDOMEN: soft and non-tender; no masses, no organomegaly, normal abdominal bowel sounds; no pannus; no intertriginous candida. There is no rebound and no distention. Rectal Exam: Not done EXTREMITIES: No bone or joint deformity; age-appropriate arthropathy of the hands and knees; no edema; no ulcerations.  There is no calf tenderness. Genitalia: not examined PULSES: 2+ and symmetric SKIN: Normal hydration no rash or  ulceration CNS: Cranial nerves 2-12 grossly intact no focal lateralizing neurologic deficit.  Speech is fluent; uvula elevated with phonation, facial symmetry and tongue midline. DTR are normal bilaterally, cerebella exam is intact, barbinski is negative and strengths are equaled bilaterally.  No sensory loss.   Labs on Admission:  Basic Metabolic Panel:  Recent Labs Lab 10/09/14 0606 10/10/14 0633 10/11/14 0645 10/13/14 1255 10/14/14 0625  NA 140 139 137 142 141  K 4.0 4.4 4.7 4.6 4.9  CL 108 108 105 108 108  CO2 '26 25 25 25 23  '$ GLUCOSE 116* 120* 110* 118* 87  BUN 69* 64* 67* 80* 85*  CREATININE 3.23* 2.94* 3.13*  3.97* 4.51*  CALCIUM 7.7* 7.6* 7.7* 8.0* 7.5*   Liver Function Tests:  Recent Labs Lab 10/13/14 1255 10/14/14 0625  AST 61* 69*  ALT 38 39  ALKPHOS 122 129*  BILITOT 5.2* 5.5*  PROT 6.7 6.3*  ALBUMIN 1.6* 1.5*    Recent Labs Lab 10/13/14 1255  LIPASE 47   No results for input(s): AMMONIA in the last 168 hours. CBC:  Recent Labs Lab 10/08/14 0605 10/10/14 0633 10/13/14 1255 10/14/14 0625  WBC 6.6 8.6 9.0 15.4*  NEUTROABS  --  7.7 8.3*  --   HGB 10.0* 9.2* 8.4* 8.1*  HCT 29.7* 28.6* 25.4* 24.6*  MCV 88.4 90.8 90.4 90.1  PLT 22* 31* 43* 43*   Cardiac Enzymes:  Recent Labs Lab 10/13/14 1255  TROPONINI <0.03    CBG:  Recent Labs Lab 10/10/14 2007 10/11/14 0005 10/11/14 0415 10/11/14 0808 10/11/14 1132  GLUCAP 127* 83 79 101* 119*     Radiological Exams on Admission: US Renal  10/14/2014   CLINICAL DATA:  Acute on chronic renal failure  EXAM: RENAL / URINARY TRACT ULTRASOUND COMPLETE  COMPARISON:  10/02/2014  FINDINGS: Right Kidney:  Length: 13.9 cm. Stent is noted in a prominent extrarenal pelvis. The degree of hydronephrosis has improved in the interval from the prior exam.  Left Kidney:  Length: 14.8 cm. Hydronephrosis is again identified and stable from the prior exam. The known ureteral stent is not well visualized on this exam.   Bladder:  Partially decompressed by Foley catheter.  IMPRESSION: Improved hydronephrosis on the right status post stent placement. Left hydronephrosis is again identified. The known ureteral stent is not well appreciated on this exam.   Electronically Signed   By: Inez Catalina M.D.   On: 10/14/2014 11:13   US Abdomen Limited Ruq  10/14/2014   CLINICAL DATA:  Jaundice.  Previous cholecystectomy.  EXAM: US ABDOMEN LIMITED - RIGHT UPPER QUADRANT  COMPARISON:  Renal ultrasound dated 10/02/2014. Abdomen CT dated 05/14/2014.  FINDINGS: Gallbladder:  Surgically absent.  Common bile duct:  Diameter: 2.2 mm  Liver:  Ill-defined increased echogenicity which is mildly heterogeneous. Mildly lobulated liver contours. Changes of cirrhosis seen on the previous CT.  No free peritoneal fluid seen.  IMPRESSION: 1. No biliary obstruction. 2. Changes of cirrhosis of the liver. 3. Status post cholecystectomy.   Electronically Signed   By: Claudie Revering M.D.   On: 10/14/2014 11:12   Assessment/Plan Present on Admission:  . Acute kidney injury . Bilateral hydronephrosis . Hydronephrosis . Pancytopenia . Recurrent UTI . Thrombocytopenia  PLAN:   AKI:  Will continue with IVF.  I have consulted nephrology again, but I don't think he is a candidate for dialysis.   Will follow Cr closely. Bilateral hydronephrosis:  Improving some on the right side, stable.  But his Cr is getting worse. Pancytopenia:  His platelet count is low, but stable.  No active bleeding at this time.  Failure to thrive:  He is not having any appetite.   I discussed with the family that he has multi-organ system involvement, and that he is very ill.  I confirmed that he is DNR.  Family will continue the current course, but was told that he is not doing well, and should contemplate on comfort care, especially if he deteriorate any further.   Other plans as per orders.  Code Status: DNR.    Orvan Falconer, MD. [facility] Pager [phone] 7pm  to 7am.  10/14/2014, 12:55 PM

## 2014-10-14 NOTE — Consult Note (Signed)
[name] Consultation Oncology  Name: [name]      MRN: [id number]    Location: A319/A319-01  Date: 10/14/2014 Time:5:01 PM   REFERRING PHYSICIAN:  Orvan Falconer, MD  REASON FOR CONSULT:  Pancytopenia   DIAGNOSIS:  Pancytopenia in the setting of cirrhosis of liver, splenomegaly, esophageal varices with metastatic CRC.  HISTORY OF PRESENT ILLNESS:   [name] is a 64 yo white American man who is well known to the [facility] where he has been undergoing treatment for metastatic CRC for ~ 8 years, last treated with monoclonal antibody, Erbitux single agent that is not known to be immunosuppressive.  He has a known history of pancytopenia secondary to cirrhosis of liver, splenomegaly, esophageal varices pre-dating systemic chemotherapy, but with a likely influence from years worth of cytotoxic chemotherapy.  His renal history is well documented.     Adenocarcinoma of rectum   04/11/2011 - 07/17/2012 Chemotherapy FOLFOX + Avastin   02/24/2014 Progression CT CAP-  Findings within the chest are worrisome for recurrent tumor with bilateral pulmonary metastasis.   03/17/2014 - 04/21/2014 Chemotherapy Erbitux single agent   04/28/2014 - 05/18/2014 Hospital Admission Hematuria,  AKI (acute kidney injury),  Obstructive uropathy,  Anemia,  Metabolic acidosis. S/p left PCN. Removed 12/20 by GU. Received 20 units of PRBCs and 11 units of platelets during this admission   06/09/2014 - 09/15/2014 Chemotherapy Re-load with single-agent Erbitux   07/07/2014 Adverse Reaction Mild skin fissures of hands.  Secondary to Cetuximab.  Will treat supportively.   09/26/2014 - 10/11/2014 Hospital Admission Bilateral hydronephrosis   10/13/2014 [facility] Admission Renal failure, impending liver failure.  Actively dying.   Patient is in bed, surrounded by family.    He is a DNR.  Comfort measures only are currently instituted.   I reviewed his labs with the family.  They agree that they do not want dialysis and I  support that decision 100%.   Unfortunately, [name] is actively dying.  He is appropriate for comfort care only.   BMbx was not too telling.  PAST MEDICAL HISTORY:   Past Medical History  Diagnosis Date  . Pancytopenia, acquired 12/04/2010  . B12 deficiency 12/07/2010  . Kidney stones   . CAD (coronary artery disease)   . Anemia   . History of radiation therapy 06/19/07 -07/27/07    whole pelvis  . Hx of radiation therapy 08/06/12,08/09/12,& 08/13/12    rul 54GY/4f  . Cancer     Colon ca dx 2008 surg/rad/chemo  . Lung cancer   . FH: chemotherapy   . Rectal bleeding 09/20/2013    From recurrent rectal mass/recurrent rectal adenocarcinoma  . Acute blood loss anemia 09/27/2013  . 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  2.7 cm primary, moderately differentiated with 3/8 positive lymph nodes without LVI.  Surgery was on 04/22/2007 and was treated postoperatively with radiation and concomitant capecitabine and then 4 cycles of a planned 6 with oxaliplatin and capecitabine.  His counts would not allow treatment of  . Hydronephrosis of left kidney 09/24/2013    Kidney stones; s/p cystoscopy and stent  . Gross hematuria 09/25/2103  . Lung metastases 07/09/2013  . Secondary malignancy of right lung 07/09/2013  . Insomnia 06/09/2014  . Foley catheter in place 07/02/14  . Broken collarbone feb 2016  . Myocardial infarction     2003   . Heart murmur   .  Sleep apnea     hx of cpap no longer wears   . Diabetes mellitus without complication     hx of no longer on meds   . Hypertension     hx of   . Shortness of breath dyspnea     due to radiaiton treatment   . GERD (gastroesophageal reflux disease)   . Bilateral hydronephrosis 09/27/2014    ALLERGIES: Allergies  Allergen Reactions  . Daypro [Oxaprozin] Nausea And Vomiting  . Xeloda [Capecitabine] Other (See Comments)    Homocidal and suicidal  ideations      MEDICATIONS: I have reviewed the patient's current medications.     PAST SURGICAL HISTORY Past Surgical History  Procedure Laterality Date  . Ileostomy  12/07/2010    Procedure: ILEOSTOMY;  Surgeon: Donato Heinz;  Location: [facility];  Service: General;  Laterality: N/A;  Diverting Ileostomy Lysis of adhesions Exploratory Laparotomy  . Lung biopsy  10/27/10    rt lobe- adenocarcinoma  . Cholecystectomy    . Cataract extraction w/ intraocular lens implant  2007    bil  . Lithotripsy  2002  . Coronary angioplasty with stent placement  2003    stenting x 2  . Colon resection  2008    x2  . Esophagogastroduodenoscopy N/A 05/02/2013    Dr. Gala Romney- polypoid antral fold, bx= reactive gastropathy  . Givens capsule study N/A 05/02/2013    Procedure: GIVENS CAPSULE STUDY;  Surgeon: Daneil Dolin, MD;  Location: [facility];  Service: Endoscopy;  Laterality: N/A;  . Colonoscopy N/A 09/20/2013    SLF:A near circumferential fungating mass with friable surfaces was found in the rectum.  ACTIVELY OOZING.  CLOTS SEEN IN LUMEN AND ASPIRATED.  Multiple biopsies were performed using cold forceps.  Thermal therapy(BICAP 7 Fr 25W) was used to ATTEMPT TO control bleeding.    HEMOSTASIS NOT ACHEIVED.  Marland Kitchen Cystoscopy w/ ureteral stent placement Left 09/24/2013    Procedure: CYSTOSCOPY WITH LEFT RETROGRADE PYELOGRAM; LEFT URETERAL STENT PLACEMENT; REMOVAL OF SMALL BLADDER CALCULI;  Surgeon: Marissa Nestle, MD;  Location: [facility];  Service: Urology;  Laterality: Left;  . Flexible sigmoidoscopy N/A 09/25/2013    DZH:GDJMEQAST exophytic rectal mass most likely representing recurrent adenocarcinoma status post biopsy  . Cystoscopy with retrograde pyelogram, ureteroscopy and stent placement Left 05/08/2014    Procedure: CYSTOSCOPY/ EVACUATION OF CLOTS  WITH LEFT RETROGRADE PYELOGRAM,LEFT URETEROSCOPY ;  Surgeon: Malka So, MD;  Location: [facility];  Service: Urology;  Laterality: Left;  . Nephrolithotomy  Left 05/13/2014    Procedure: LEFT NEPHROLITHOTOMY PERCUTANEOUS FIRST STAGE;  Surgeon: Malka So, MD;  Location: [facility];  Service: Urology;  Laterality: Left;  . Holmium laser application Left 41/96/2229    Procedure: HOLMIUM LASER APPLICATION;  Surgeon: Malka So, MD;  Location: [facility];  Service: Urology;  Laterality: Left;  . Cystoscopy w/ ureteral stent placement Bilateral 09/26/2014    Procedure: CYSTOSCOPY WITH CLOT EVACUATION; LEFT URETERAL STENT REMOVAL; BILATERAL RETROGRADE PYELOGRAM; BILATERAL URETERAL STENT PLACEMENT;  Surgeon: Alexis Frock, MD;  Location: [facility];  Service: Urology;  Laterality: Bilateral;    FAMILY HISTORY: Family History  Problem Relation Age of Onset  . Cervical cancer Mother   . Rectal cancer Father 66  . Uterine cancer Sister     SOCIAL HISTORY:  reports that he has quit smoking. His smoking use included Cigarettes and Cigars. He has a 60 pack-year smoking history. He quit smokeless tobacco use about 9 years ago.  His smokeless tobacco use included Chew. He reports that he does not drink alcohol or use illicit drugs.  PERFORMANCE STATUS: The patient's performance status is 4 - Bedbound  PHYSICAL EXAM: Most Recent Vital Signs: Blood pressure 118/54, pulse 115, temperature 97.6 F (36.4 C), temperature source Oral, resp. rate 21, height 5' 10"  (1.778 m), weight 215 lb 9.8 oz (97.8 kg), SpO2 98 %. General appearance: icteric and no distress Head: Normocephalic, without obvious abnormality, atraumatic Eyes: negative findings: lids and lashes normal, conjunctivae and sclerae normal and corneas clear, positive findings: sclera icterus Lungs: clear to auscultation bilaterally Skin: jaundice noted Neurologic: Mental status: alertness: lethargic clonus noted   LABORATORY DATA:  Results for orders placed or performed during the hospital encounter of 10/13/14 (from the past 48 hour(s))  CBC with Differential/Platelet     Status: Abnormal   Collection  Time: 10/13/14 12:55 PM  Result Value Ref Range   WBC 9.0 4.0 - 10.5 K/uL   RBC 2.81 (L) 4.22 - 5.81 MIL/uL   Hemoglobin 8.4 (L) 13.0 - 17.0 g/dL   HCT 25.4 (L) 39.0 - 52.0 %   MCV 90.4 78.0 - 100.0 fL   MCH 29.9 26.0 - 34.0 pg   MCHC 33.1 30.0 - 36.0 g/dL   RDW 17.0 (H) 11.5 - 15.5 %   Platelets 43 (L) 150 - 400 K/uL    Comment: SPECIMEN CHECKED FOR CLOTS CONSISTENT WITH PREVIOUS RESULT    Neutrophils Relative % 92 (H) 43 - 77 %   Neutro Abs 8.3 (H) 1.7 - 7.7 K/uL   Lymphocytes Relative 3 (L) 12 - 46 %   Lymphs Abs 0.3 (L) 0.7 - 4.0 K/uL   Monocytes Relative 5 3 - 12 %   Monocytes Absolute 0.4 0.1 - 1.0 K/uL   Eosinophils Relative 0 0 - 5 %   Eosinophils Absolute 0.0 0.0 - 0.7 K/uL   Basophils Relative 0 0 - 1 %   Basophils Absolute 0.0 0.0 - 0.1 K/uL  Comprehensive metabolic panel     Status: Abnormal   Collection Time: 10/13/14 12:55 PM  Result Value Ref Range   Sodium 142 135 - 145 mmol/L   Potassium 4.6 3.5 - 5.1 mmol/L   Chloride 108 101 - 111 mmol/L   CO2 25 22 - 32 mmol/L   Glucose, Bld 118 (H) 65 - 99 mg/dL   BUN 80 (H) 6 - 20 mg/dL   Creatinine, Ser 3.97 (H) 0.61 - 1.24 mg/dL   Calcium 8.0 (L) 8.9 - 10.3 mg/dL   Total Protein 6.7 6.5 - 8.1 g/dL   Albumin 1.6 (L) 3.5 - 5.0 g/dL   AST 61 (H) 15 - 41 U/L   ALT 38 17 - 63 U/L   Alkaline Phosphatase 122 38 - 126 U/L   Total Bilirubin 5.2 (H) 0.3 - 1.2 mg/dL   GFR calc non Af Amer 15 (L) >60 mL/min   GFR calc Af Amer 17 (L) >60 mL/min    Comment: (NOTE) The eGFR has been calculated using the CKD EPI equation. This calculation has not been validated in all clinical situations. eGFR's persistently <60 mL/min signify possible Chronic Kidney Disease.    Anion gap 9 5 - 15  Lipase, blood     Status: None   Collection Time: 10/13/14 12:55 PM  Result Value Ref Range   Lipase 47 22 - 51 U/L  Troponin I     Status: None   Collection Time: 10/13/14 12:55 PM  Result Value  Ref Range   Troponin I <0.03 <0.031 ng/mL     Comment:        NO INDICATION OF MYOCARDIAL INJURY.   Protime-INR     Status: Abnormal   Collection Time: 10/13/14 12:55 PM  Result Value Ref Range   Prothrombin Time 18.7 (H) 11.6 - 15.2 seconds   INR 1.54 (H) 0.00 - 1.49  Bilirubin, direct     Status: Abnormal   Collection Time: 10/13/14 12:55 PM  Result Value Ref Range   Bilirubin, Direct 2.9 (H) 0.1 - 0.5 mg/dL  TSH     Status: None   Collection Time: 10/13/14 12:55 PM  Result Value Ref Range   TSH 1.377 0.350 - 4.500 uIU/mL  Urinalysis, Routine w reflex microscopic     Status: Abnormal   Collection Time: 10/13/14  1:11 PM  Result Value Ref Range   Color, Urine RED (A) YELLOW    Comment: BIOCHEMICALS MAY BE AFFECTED BY COLOR   APPearance CLOUDY (A) CLEAR   Specific Gravity, Urine 1.010 1.005 - 1.030   pH >9.0 (H) 5.0 - 8.0   Glucose, UA 100 (A) NEGATIVE mg/dL   Hgb urine dipstick LARGE (A) NEGATIVE   Bilirubin Urine LARGE (A) NEGATIVE   Ketones, ur 15 (A) NEGATIVE mg/dL   Protein, ur >300 (A) NEGATIVE mg/dL   Urobilinogen, UA 4.0 (H) 0.0 - 1.0 mg/dL   Nitrite POSITIVE (A) NEGATIVE   Leukocytes, UA LARGE (A) NEGATIVE  Urine microscopic-add on     Status: Abnormal   Collection Time: 10/13/14  1:11 PM  Result Value Ref Range   WBC, UA 11-20 <3 WBC/hpf   RBC / HPF 7-10 <3 RBC/hpf   Bacteria, UA MANY (A) RARE  Comprehensive metabolic panel     Status: Abnormal   Collection Time: 10/14/14  6:25 AM  Result Value Ref Range   Sodium 141 135 - 145 mmol/L   Potassium 4.9 3.5 - 5.1 mmol/L   Chloride 108 101 - 111 mmol/L   CO2 23 22 - 32 mmol/L   Glucose, Bld 87 65 - 99 mg/dL   BUN 85 (H) 6 - 20 mg/dL   Creatinine, Ser 4.51 (H) 0.61 - 1.24 mg/dL   Calcium 7.5 (L) 8.9 - 10.3 mg/dL   Total Protein 6.3 (L) 6.5 - 8.1 g/dL   Albumin 1.5 (L) 3.5 - 5.0 g/dL   AST 69 (H) 15 - 41 U/L   ALT 39 17 - 63 U/L   Alkaline Phosphatase 129 (H) 38 - 126 U/L   Total Bilirubin 5.5 (H) 0.3 - 1.2 mg/dL   GFR calc non Af Amer 13 (L) >60 mL/min    GFR calc Af Amer 15 (L) >60 mL/min    Comment: (NOTE) The eGFR has been calculated using the CKD EPI equation. This calculation has not been validated in all clinical situations. eGFR's persistently <60 mL/min signify possible Chronic Kidney Disease.    Anion gap 10 5 - 15  CBC     Status: Abnormal   Collection Time: 10/14/14  6:25 AM  Result Value Ref Range   WBC 15.4 (H) 4.0 - 10.5 K/uL   RBC 2.73 (L) 4.22 - 5.81 MIL/uL   Hemoglobin 8.1 (L) 13.0 - 17.0 g/dL   HCT 24.6 (L) 39.0 - 52.0 %   MCV 90.1 78.0 - 100.0 fL   MCH 29.7 26.0 - 34.0 pg   MCHC 32.9 30.0 - 36.0 g/dL   RDW 17.1 (H) 11.5 - 15.5 %  Platelets 43 (L) 150 - 400 K/uL    Comment: SPECIMEN CHECKED FOR CLOTS PLATELET COUNT CONFIRMED BY SMEAR       RADIOGRAPHY: US Renal  10/14/2014   CLINICAL DATA:  Acute on chronic renal failure  EXAM: RENAL / URINARY TRACT ULTRASOUND COMPLETE  COMPARISON:  10/02/2014  FINDINGS: Right Kidney:  Length: 13.9 cm. Stent is noted in a prominent extrarenal pelvis. The degree of hydronephrosis has improved in the interval from the prior exam.  Left Kidney:  Length: 14.8 cm. Hydronephrosis is again identified and stable from the prior exam. The known ureteral stent is not well visualized on this exam.  Bladder:  Partially decompressed by Foley catheter.  IMPRESSION: Improved hydronephrosis on the right status post stent placement. Left hydronephrosis is again identified. The known ureteral stent is not well appreciated on this exam.   Electronically Signed   By: Inez Catalina M.D.   On: 10/14/2014 11:13   US Abdomen Limited Ruq  10/14/2014   CLINICAL DATA:  Jaundice.  Previous cholecystectomy.  EXAM: US ABDOMEN LIMITED - RIGHT UPPER QUADRANT  COMPARISON:  Renal ultrasound dated 10/02/2014. Abdomen CT dated 05/14/2014.  FINDINGS: Gallbladder:  Surgically absent.  Common bile duct:  Diameter: 2.2 mm  Liver:  Ill-defined increased echogenicity which is mildly heterogeneous. Mildly lobulated liver  contours. Changes of cirrhosis seen on the previous CT.  No free peritoneal fluid seen.  IMPRESSION: 1. No biliary obstruction. 2. Changes of cirrhosis of the liver. 3. Status post cholecystectomy.   Electronically Signed   By: Claudie Revering M.D.   On: 10/14/2014 11:12       PATHOLOGY:    Diagnosis Bone Marrow, Aspirate,Biopsy, and Clot, right iliac - NORMOCELLULAR MARROW WITH DYSPOIESIS. 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 - NO METASTATIC CARCINOMA. - SEE COMMENT. PERIPHERAL BLOOD: - NORMOCYTIC ANEMIA. - THROMBOCYTOPENIA. Diagnosis Note The bone marrow is overall normocellular. There is varying degrees of dysplasia in all three lineages. Given the patient's history of pernicious anemia and B12 therapy, the significance of the changes are unknown. The patient also has cirrhosis, which could cause thrombocytopenia. 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 Correlation with cytogenetics is recommended. Also, there is a mild plasmacytosis (10%). While there are some atypical morphologic features, light chain immunohistochemistry reveals a polytypic mix of kappa and lambda plasma cells, suggesting a reactive process. There is no evidence of metastatic carcinoma. The case was discussed with T. Kefalas on 10/10/2014. Vicente Males MD Pathologist, Electronic Signature (Case signed 10/10/2014)     ASSESSMENT:  1. Actively dying 2. DNR 3. Pancytopenia, multifactorial, stable 4. Stage IV CRC.  Last treated 4 weeks ago with single agent Erbitux (monoclonal antibody) which is not immunosuppressive, thus recent treatment is non-contributory to his pancytopenia. 5. Acute renal failure 6. Hyperbilirubinemia 7. Hypoalbuminemia 8. Leukocytosis, reactive   PLAN:  1. I personally reviewed and went over laboratory results with the patient's family.  The results are noted within this  dictation. 2. I personally reviewed and went over radiographic studies with the patient's family.  The results are noted within this dictation.   3. I personally reviewed and went over pathology results with the patient's family. 4. Chart reviewed 5. DNR 6. No hemodialysis 7. D/C [facility] 8. Consult Hospice 9. Suspect hours- days of life.  Patient would benefit from inpatient hospice if condition improves some.  Otherwise, he will likely pass during this hospitalization. 10. Comfort care  only.   All questions were answered. The patient knows to call the clinic with any problems, questions or concerns. We can certainly see the patient much sooner if necessary.  Patient and plan discussed with Dr. Ancil Linsey and she is in agreement with the aforementioned.   KEFALAS,THOMAS  10/14/2014 5:11 PM    Unfortunately as above. Nothing further to add. Patients family is very accepting. Donald Pore MD

## 2014-10-14 NOTE — Progress Notes (Signed)
RN noted large amount of drainage from around foley catheter insertion site. Drainage appears serosanguinous in nature. Dr. Marin Comment made aware. No new orders at this time. Will continue to monitor.

## 2014-10-14 NOTE — Consult Note (Signed)
[name] MRN: [id number] DOB/AGE: 62/18/1952 62 y.o. Primary Care Physician:[name], Betsy Coder, MD Admit date: 10/13/2014 Chief Complaint:  Chief Complaint  Patient presents with  . Anorexia   HPI:Pt is 64 year old male with past medical hx of metastatic cancer who came to Er with c/o decreased appetite.  HPI dates back to past few weeks when pt was admitted with abnormal labs Pt later was diagnosed with hydronephrosis and admitted. Pt on 09/26/14 had Cystoscopy with clot evacuation, left ureteral stent removal; bilateral retrograde pyelograms; bilateral ureteral stent placement. 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 Pt was d/ced home on 10/11/14 and pt came back on 10/13/14 Pt after d/c was not eating at all. Pt wife so brought him to Er. Pt is lethargic and unable to offers any complaints. Pt wife and rest of family says " all we want is to make him comfortable" Pt wife says he said he doesn't want  any dialysis.    Past Medical History  Diagnosis Date  . Pancytopenia, acquired 12/04/2010  . B12 deficiency 12/07/2010  . Kidney stones   . CAD (coronary artery disease)   . Anemia   . History of radiation therapy 06/19/07 -07/27/07    whole pelvis  . Hx of radiation therapy 08/06/12,08/09/12,& 08/13/12    rul 56GY/67f  . Cancer     Colon ca dx 2008 surg/rad/chemo  . Lung cancer   . FH: chemotherapy   . Rectal bleeding 09/20/2013    From recurrent rectal mass/recurrent rectal adenocarcinoma  . Acute blood loss anemia 09/27/2013  . 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  2.7 cm primary, moderately differentiated with 3/8 positive lymph nodes without LVI.  Surgery was on 04/22/2007 and was treated postoperatively with radiation and concomitant capecitabine and then 4 cycles of a planned 6 with oxaliplatin and capecitabine.  His counts would  not allow treatment of  . Hydronephrosis of left kidney 09/24/2013    Kidney stones; s/p cystoscopy and stent  . Gross hematuria 09/25/2103  . Lung metastases 07/09/2013  . Secondary malignancy of right lung 07/09/2013  . Insomnia 06/09/2014  . Foley catheter in place 07/02/14  . Broken collarbone feb 2016  . Myocardial infarction     2003   . Heart murmur   . Sleep apnea     hx of cpap no longer wears   . Diabetes mellitus without complication     hx of no longer on meds   . Hypertension     hx of   . Shortness of breath dyspnea     due to radiaiton treatment   . GERD (gastroesophageal reflux disease)   . Bilateral hydronephrosis 09/27/2014       Family History  Problem Relation Age of Onset  . Cervical cancer Mother   . Rectal cancer Father 732 . Uterine cancer Sister     Social History:  reports that he has quit smoking. His smoking use included Cigarettes and Cigars. He has a 60 pack-year smoking history. He quit smokeless tobacco use about 9 years ago. His smokeless tobacco use included Chew. He reports that he does not drink alcohol or use illicit drugs.   Allergies:  Allergies  Allergen Reactions  . Daypro [Oxaprozin] Nausea And Vomiting  . Xeloda [Capecitabine] Other (See Comments)    Homocidal and suicidal ideations  Medications Prior to Admission  Medication Sig Dispense Refill  . cyanocobalamin (,VITAMIN B-12,) 1000 MCG/ML injection Inject 1,000 mcg into the muscle every 30 (thirty) days.     . folic acid (FOLVITE) 1 MG tablet Take 1 tablet (1 mg total) by mouth daily. 30 tablet 5  . HYDROcodone-acetaminophen (NORCO) 7.5-325 MG per tablet Take 1 tablet by mouth every 6 (six) hours as needed for moderate pain. 90 tablet 0  . hydrocortisone cream 1 % Apply 1 application topically 2 (two) times daily. Mix with urea cream and apply to affected areas. 30 g 5  . lidocaine-prilocaine (EMLA) cream Apply a quarter size amount to port site 1 hour prior to chemo. Do not  rub in. Cover with plastic wrap. 30 g 3  . magnesium oxide (MAG-OX) 400 (241.3 MG) MG tablet Take 1 tablet (400 mg total) by mouth daily. (Patient taking differently: Take 400 mg by mouth daily. Patient takes at nite) 30 tablet 3  . omeprazole (PRILOSEC) 20 MG capsule Take 1 capsule (20 mg total) by mouth daily. 30 capsule 2  . furosemide (LASIX) 40 MG tablet Take 1 tablet (40 mg total) by mouth daily. (Patient not taking: Reported on 10/13/2014) 30 tablet 1  . sodium bicarbonate 650 MG tablet Take 2 tablets (1,300 mg total) by mouth 3 (three) times daily. (Patient not taking: Reported on 10/13/2014) 90 tablet 1       EML:JQGBEE to get  . cefTRIAXone (ROCEPHIN)  IV  1 g Intravenous Q24H  . folic acid  1 mg Oral Daily  . magnesium oxide  400 mg Oral Daily  . pantoprazole  40 mg Oral Daily      Physical Exam: Vital signs in last 24 hours: Temp:  [97.6 F (36.4 C)-100.1 F (37.8 C)] 97.6 F (36.4 C) (05/17 0621) Pulse Rate:  [88-126] 115 (05/17 0621) Resp:  [16-24] 21 (05/17 0621) BP: (113-160)/(54-81) 118/54 mmHg (05/17 0621) SpO2:  [95 %-99 %] 98 % (05/17 0621) Weight:  [215 lb 9.8 oz (97.8 kg)] 215 lb 9.8 oz (97.8 kg) (05/16 1745) Weight change:  Last BM Date: 10/14/14  Intake/Output from previous day: 05/16 0701 - 05/17 0700 In: 961.3 [I.V.:961.3] Out: -  Total I/O In: 120 [P.O.:120] Out: 400 [Stool:400]   Physical Exam: General- pt is lethargic not arousable. HEENT- sclera muddy Resp- No acute REsp distress, decreased BS at bases. CVS- S1S2 regular ib rate and rhythm GIT- BS+, soft, NT, ND EXT- 1+ LE Edema, NO Cyanosis GU- Foley in situ    Lab Results: CBC  Recent Labs  10/13/14 1255 10/14/14 0625  WBC 9.0 15.4*  HGB 8.4* 8.1*  HCT 25.4* 24.6*  PLT 43* 43*    BMET  Recent Labs  10/13/14 1255 10/14/14 0625  NA 142 141  K 4.6 4.9  CL 108 108  CO2 25 23  GLUCOSE 118* 87  BUN 80* 85*  CREATININE 3.97* 4.51*  CALCIUM 8.0* 7.5*   Creat  Trend  2016   3.1=>3.97=>4.51  Last admission  Stent placed Bladder clots Foley irrigated 2016 4.28=>3.39=>2.63 =>5.5=> 4.15 ==> 3.61=>3.23=>2.94--3.13 2015 1.8--18.4 ( AKI) 2014 1.5--2.78 ( AKI) 2012 1.3--5.4 ( 3 episodes of AKI ) 2008 0.9--3.76 ( AKI)  MICRO No results found for this or any previous visit (from the past 240 hour(s)).    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 Pt continues to be On PO Bicarb  8) Liver- admitted with high Bilirubin  Primary team following  9) prognosis -poor    Pt family is thinking about comfort care.family made clear they do not want aggressive measures including  NO dialysis.   Primary team is following .  10) ID- UTI on IV Ceftriaxone  Plan:  Agree with  With current tx and plan. Pt with  poor prognosis. I had extensive discussion with pt family about  His kidney and overall issues and tried to  answer the questions to best of my ability.      Reiana Poteet S 10/14/2014, 2:32 PM

## 2014-10-14 NOTE — Progress Notes (Signed)
UR chart review completed.  

## 2014-10-14 NOTE — Progress Notes (Signed)
Present with patient for support. Family was present. Will continue to offer support.

## 2014-10-15 ENCOUNTER — Ambulatory Visit (HOSPITAL_COMMUNITY): Payer: Managed Care, Other (non HMO) | Admitting: Oncology

## 2014-10-15 ENCOUNTER — Inpatient Hospital Stay (HOSPITAL_COMMUNITY): Payer: Medicare Other

## 2014-10-15 ENCOUNTER — Encounter (HOSPITAL_COMMUNITY): Payer: Managed Care, Other (non HMO)

## 2014-10-15 DIAGNOSIS — N1330 Unspecified hydronephrosis: Secondary | ICD-10-CM

## 2014-10-15 DIAGNOSIS — C20 Malignant neoplasm of rectum: Secondary | ICD-10-CM

## 2014-10-15 DIAGNOSIS — N179 Acute kidney failure, unspecified: Secondary | ICD-10-CM

## 2014-10-15 LAB — TISSUE HYBRIDIZATION (BONE MARROW)-NCBH

## 2014-10-15 LAB — CHROMOSOME ANALYSIS, BONE MARROW

## 2014-10-15 MED ORDER — LORAZEPAM 1 MG PO TABS: 1.0000 mg | ORAL_TABLET | ORAL | Status: AC | PRN

## 2014-10-15 MED ORDER — SCOPOLAMINE 1 MG/3DAYS TD PT72: 1.0000 | MEDICATED_PATCH | TRANSDERMAL | Status: AC

## 2014-10-15 MED ORDER — MORPHINE SULFATE (CONCENTRATE) 10 MG /0.5 ML PO SOLN: 10.0000 mg | ORAL | Status: AC | PRN

## 2014-10-15 MED ORDER — SCOPOLAMINE 1 MG/3DAYS TD PT72
1.0000 | MEDICATED_PATCH | TRANSDERMAL | Status: DC
  Administered 2014-10-15: 1.5 mg via TRANSDERMAL
  Filled 2014-10-15: qty 1

## 2014-10-15 MED ORDER — MORPHINE SULFATE 2 MG/ML IJ SOLN
2.0000 mg | INTRAMUSCULAR | Status: DC | PRN
  Administered 2014-10-15 (×2): 2 mg via INTRAVENOUS
  Filled 2014-10-15 (×2): qty 1

## 2014-10-17 LAB — URINE CULTURE: Colony Count: >=100000

## 2014-10-21 ENCOUNTER — Encounter (HOSPITAL_COMMUNITY): Payer: Self-pay

## 2014-10-29 NOTE — Progress Notes (Signed)
Report given to [facility] at hospice home.

## 2014-10-29 NOTE — Progress Notes (Signed)
[name]  MRN: [id number]  DOB/AGE: [birth year] 64 y.o.  Primary Care Physician:[name], [name] Coder, MD  Admit date: 10/13/2014  Chief Complaint:  Chief Complaint  Patient presents with  . Anorexia    S-Pt presented on  10/13/2014 with  Chief Complaint  Patient presents with  . Anorexia  .    Pt is lethargic, not offering any complaints.   Pt family has decided about comfort care.      Med . cefTRIAXone (ROCEPHIN)  IV  1 g Intravenous Q24H  . magnesium oxide  400 mg Oral Daily  . pantoprazole  40 mg Oral Daily     Physical Exam: Vital signs in last 24 hours: Temp:  [98 F (36.7 C)-98.3 F (36.8 C)] 98.3 F (36.8 C) (05/18 0600) Pulse Rate:  [110-119] 119 (05/18 0600) Resp:  [16-22] 21 (05/18 0600) BP: (100-135)/(52-83) 100/58 mmHg (05/18 0600) SpO2:  [92 %-95 %] 92 % (05/18 0600) Weight change:  Last BM Date: Oct 21, 2014  Intake/Output from previous day: 05/17 0701 - 05/18 0700 In: 2000 [P.O.:180; I.V.:1770; IV Piggyback:50] Out: 2650 [Stool:2650] Total I/O In: 0  Out: 825 [Stool:825]   Physical Exam: General- pt is lethargic not arousable. HEENT- sclera muddy Resp-decreased BS at bases, Rhonchi + CVS- S1S2 regular ib rate and rhythm GIT- BS+, soft, NT, ND EXT- 1+ LE Edema, NO Cyanosis GU- Foley in situ  Lab Results: CBC  Recent Labs  10/13/14 1255 10/14/14 0625  WBC 9.0 15.4*  HGB 8.4* 8.1*  HCT 25.4* 24.6*  PLT 43* 43*    BMET  Recent Labs  10/13/14 1255 10/14/14 0625  NA 142 141  K 4.6 4.9  CL 108 108  CO2 25 23  GLUCOSE 118* 87  BUN 80* 85*  CREATININE 3.97* 4.51*  CALCIUM 8.0* 7.5*   Creat Trend  2016 3.1=>3.97=>4.51  Last admission  Stent placed Bladder clots Foley irrigated 2016 4.28=>3.39=>2.63 =>5.5=> 4.15 ==> 3.61=>3.23=>2.94--3.13 2015 1.8--18.4 ( AKI) 2014 1.5--2.78 ( AKI) 2012 1.3--5.4 ( 3 episodes of AKI ) 2008 0.9--3.76 (  AKI)   MICRO Recent Results (from the past 240 hour(s))  Urine culture     Status: None (Preliminary result)   Collection Time: 10/13/14  1:11 PM  Result Value Ref Range Status   Specimen Description URINE, CATHETERIZED  Final   Special Requests NONE  Final   Culture   Final    Culture reincubated for better growth Performed at [facility]    Report Status PENDING  Incomplete      Lab Results  Component Value Date   CALCIUM 7.5* 10/14/2014   CAION 1.23 01/14/2013   PHOS 4.9* 09/29/2014        Impression: 1)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  Primary team is following .   Plan:  Agree with With current tx and plan. Pt with poor prognosis. I had extensive discussion with pt family again  about His kidney and overall prognosis.  I  tried to answer the questions to best of my ability.    [name]    Nov 11, 2014, 2:16 PM

## 2014-10-29 NOTE — Discharge Summary (Signed)
Physician Discharge Summary  [name] JEH:[id number] DOB: [birth year] DOA: 10/13/2014  PCP: [name] Kilts, MD  Admit date: 10/13/2014 Discharge date: 2014/11/14  Time spent: 40 minutes  Recommendations for Outpatient Follow-up:  1. Discharged to [facility] for comfort care    Discharge Diagnoses:  Active Problems:   Port catheter in place   Thrombocytopenia   Hydronephrosis   Acute kidney injury   Bilateral hydronephrosis   Recurrent UTI   Pancytopenia   Weakness   Discharge Condition: poor  Diet recommendation: as tolerated  Filed Weights   10/13/14 1118 10/13/14 1745  Weight: 95.255 kg (210 lb) 97.8 kg (215 lb 9.8 oz)    History of present illness:  [name] is an 64 y.o. male discharged home a couple of days prior to presentation on 10/13/14, after being admitted for AKI, pancytopenia, obstructive uropathy s/p bilateral stent placement, with improvement of his Cr to a value of 3 upon discharge, brought to the ER again as he felt weak and unable to eat or drink. He had lost his appetite. He did not have any fever, chills, chest pain or SOB. He has the ostomy which put out liquids, and it had not changed. Evalaution in the ER showed a total bili of 5.6, with Cr of 4.0, and Hb of 8.4 g per dL, with unremarkable LFTs. The direct total bili was elevated at 2.6. He had been taking his lasix. His UA remained with 11-20 WBCs.    Hospital Course:  Acute kidney injury: US reveals hydronephrosis. related to pre-renal/atn decreased BP and po intake in setting of metastatic CRC, cirrohsis of liver. Evaluated by nephrology and family/patient decided no dialysis.   Adenocarcinoma of rectum: treated for 8 years. Evaluated by oncology who opine death hours/days and recommends comfort care. Transfer to Hospice home.  Procedures:  none  Consultations:  none  Discharge Exam: Filed Vitals:   11-14-2014 0600  BP: 100/58  Pulse: 119  Temp: 98.3 F  (36.8 C)  Resp: 21    General: lethargic facial grimace to firm sternal rub.  Cardiovascular: tachycardia no MGR Respiratory: normal effort somewhat shallow diminished BS  Discharge Instructions   Discharge Instructions    Diet - low sodium heart healthy    Complete by:  As directed      Increase activity slowly    Complete by:  As directed           Current Discharge Medication List    START taking these medications   Details  LORazepam (ATIVAN) 1 MG tablet Take 1 tablet (1 mg total) by mouth every 4 (four) hours as needed for anxiety. Qty: 30 tablet, Refills: 0    Morphine Sulfate (MORPHINE CONCENTRATE) 10 mg / 0.5 ml concentrated solution Take 0.5 mLs (10 mg total) by mouth every 3 (three) hours as needed for severe pain. Qty: 1 Bottle, Refills: 0    scopolamine (TRANSDERM-SCOP, 1.5 MG,) 1 MG/3DAYS Place 1 patch (1.5 mg total) onto the skin every 3 (three) days. Qty: 10 patch, Refills: 12      STOP taking these medications     cyanocobalamin (,VITAMIN B-12,) 1000 MCG/ML injection      folic acid (FOLVITE) 1 MG tablet      HYDROcodone-acetaminophen (NORCO) 7.5-325 MG per tablet      hydrocortisone cream 1 %      lidocaine-prilocaine (EMLA) cream      magnesium oxide (MAG-OX) 400 (241.3 MG) MG tablet  omeprazole (PRILOSEC) 20 MG capsule      furosemide (LASIX) 40 MG tablet      sodium bicarbonate 650 MG tablet        Allergies  Allergen Reactions  . Daypro [Oxaprozin] Nausea And Vomiting  . Xeloda [Capecitabine] Other (See Comments)    Homocidal and suicidal ideations      The results of significant diagnostics from this hospitalization (including imaging, microbiology, ancillary and laboratory) are listed below for reference.    Significant Diagnostic Studies: Ct Abdomen Pelvis Wo Contrast  09/26/2014   CLINICAL DATA:  Left renal stent placement.  Elevated creatinine.  EXAM: CT ABDOMEN AND PELVIS WITHOUT CONTRAST  TECHNIQUE: Multidetector CT  imaging of the abdomen and pelvis was performed following the standard protocol without IV contrast.  COMPARISON:  CT 05/14/2014  FINDINGS: Small nodules peripherally in the right middle lobe are stable since prior study. No pleural effusions. Heart is normal size. Coronary artery calcifications noted.  Prior cholecystectomy. Liver, pancreas, adrenals are unremarkable. Spleen is enlarged with a craniocaudal length of 14.8 cm compared with 12.9 cm previously.  There is severe left hydronephrosis. Proximal end of the left ureteral stent is within the proximal left ureter. The catheter loops in the urinary bladder. There are locules of gas noted dependently and non dependently within the urinary bladder. Foley catheter is in place. There are also locules of gas within the dilated left renal collecting system. Question related to instrumentation/catheterization of the bladder. Cannot exclude pyonephrosis.  There is mild right hydronephrosis, new since prior study. Bilateral low-density areas within the kidneys likely represent cysts. Punctate nonobstructing right renal stones noted. No ureteral stones.  Postoperative changes from prior subtotal colectomy. Right lower quadrant ostomy noted. There are herniated small bowel loops through the stomal defect into the anterior abdominal wall, similar to prior study. No evidence of bowel obstruction.  Aorta is normal caliber.  No free fluid, free air or adenopathy.  No acute bony abnormality or focal bone lesion.  IMPRESSION: Severe left hydronephrosis. The left ureteral stent loops in the bladder distally with the proximal end of the stent in the proximal left ureter.  Gas within the urinary bladder and left renal collecting system. This may be related to bladder instrumentation/ catheterization, but cannot exclude pyonephrosis.  New mild right hydronephrosis.  Right nephrolithiasis.  Right lower quadrant ostomy again noted with parastomal herniation of small bowel loops. No  obstruction.  Increasing splenomegaly.   Electronically Signed   By: Rolm Baptise M.D.   On: 09/26/2014 16:09   Dg Retrograde Pyelogram  09/26/2014   CLINICAL DATA:  Renal failure, left-greater-than-right hydronephrosis  EXAM: RETROGRADE PYELOGRAM  COMPARISON:  None.  FINDINGS: Retrograde opacification on the left shows severe hydronephrosis. A wire is passed in a retrograde fashion into the mid pole calyx. Moderate to severe right hydronephrosis also identified.  IMPRESSION: Bilateral hydronephrosis   Electronically Signed   By: Skipper Cliche M.D.   On: 09/26/2014 20:45   US Renal  10/14/2014   CLINICAL DATA:  Acute on chronic renal failure  EXAM: RENAL / URINARY TRACT ULTRASOUND COMPLETE  COMPARISON:  10/02/2014  FINDINGS: Right Kidney:  Length: 13.9 cm. Stent is noted in a prominent extrarenal pelvis. The degree of hydronephrosis has improved in the interval from the prior exam.  Left Kidney:  Length: 14.8 cm. Hydronephrosis is again identified and stable from the prior exam. The known ureteral stent is not well visualized on this exam.  Bladder:  Partially decompressed by  Foley catheter.  IMPRESSION: Improved hydronephrosis on the right status post stent placement. Left hydronephrosis is again identified. The known ureteral stent is not well appreciated on this exam.   Electronically Signed   By: Inez Catalina M.D.   On: 10/14/2014 11:13   US Renal  10/02/2014   CLINICAL DATA:  Acute on chronic renal failure, hematuria, nephrolithiasis, bilateral ureteral stents  EXAM: RENAL / URINARY TRACT ULTRASOUND COMPLETE  COMPARISON:  CT abdomen pelvis dated 09/26/2014  FINDINGS: Right Kidney:  Length: 15.0 cm. Moderate hydronephrosis. Indwelling ureteral stent.  Left Kidney:  Length: 13.3 cm. Moderate hydronephrosis. Ureteral stent not visualized.  Bladder:  Moderately distended despite indwelling Foley catheter. Suspected layering debris/ hemorrhage within the bladder (image 40).  IMPRESSION: Moderate bilateral  hydronephrosis.  Indwelling right ureteral stent. Left ureteral stent not visualized. When correlating with prior CT, this is likely related to low position.  Bladder is moderately distended despite indwelling Foley catheter. Suspected layering debris/hemorrhage within the bladder. Consider bladder irrigation and/or cystoscopy.   Electronically Signed   By: Julian Hy M.D.   On: 10/02/2014 11:56   US Renal  09/27/2014   CLINICAL DATA:  Acute renal failure. History renal stones and bilateral ureteral stents.  EXAM: RENAL / URINARY TRACT ULTRASOUND COMPLETE  COMPARISON:  Fluoroscopic guided bilateral retrograde ureteral stent placement - 09/26/2014; CT abdomen pelvis- 09/26/2014  FINDINGS: Right Kidney:  Normal cortical thickness, echogenicity and size, measuring 12.0 cm in length. Note is made of an approximately 2.2 x 2.4 x 2.1 cm anechoic cyst within the superior pole the right kidney which is noted to contain a thin internal echogenic septation (Bosniak 2). No echogenic renal stones. The superior aspect of the recently placed double-J ureteral stent appears appropriately positioned within the right renal pelvis though there is persistent mild right-sided pelvicaliectasis though this appears minimally improved since CT scan performed 09/26/2014.  Left Kidney:  Normal cortical thickness, echogenicity and size, measuring 13.1 cm in length. Note is made of an approximately 2.4 x 2.4 x 2.4 cm anechoic cyst within the inferior pole the left kidney. No echogenic renal stones. There is mild to moderate residual left-sided pelvicaliectasis, improved since CT scan performed 09/26/2014. The superior aspect of the left-sided double-J ureteral stent is not depicted on the present examination.  Bladder:  Appears normal for degree of bladder distention.  IMPRESSION: 1. Post right-sided double-J ureteral stent placement with minimal improvement in persistent mild right-sided pelvicaliectasis. 2. Mild to moderate  residual left-sided pelvicaliectasis, improved since abdominal CT performed 09/26/2014. The superior aspect of the left-sided double-J ureteral stent is not depicted on this examination though as ureteral stent can be poorly visualized with ultrasound further evaluation could be performed with abdominal radiograph as clinically indicated. 3. Bilateral renal cysts as above.   Electronically Signed   By: Sandi Mariscal M.D.   On: 09/27/2014 10:37   Ct Biopsy  10/08/2014   CLINICAL DATA:  64 year old male with pancytopenia  EXAM: CT BIOPSY  Date: 10/08/2014  PROCEDURE: 1. CT guided bone marrow aspiration and core biopsy Interventional Radiologist:  Criselda Peaches, MD  ANESTHESIA/SEDATION: Moderate (conscious) sedation was used. 1 mg Versed, 50 mcg Fentanyl were administered intravenously. The patient's vital signs were monitored continuously by radiology nursing throughout the procedure.  Sedation Time: 5 minutes  TECHNIQUE: Informed consent was obtained from the patient following explanation of the procedure, risks, benefits and alternatives. The patient understands, agrees and consents for the procedure. All questions were addressed. A time out  was performed.  The patient was positioned prone and noncontrast localization CT was performed of the pelvis to demonstrate the iliac marrow spaces.  Maximal barrier sterile technique utilized including caps, mask, sterile gowns, sterile gloves, large sterile drape, hand hygiene, and betadine prep.  Under sterile conditions and local anesthesia, an 11 gauge coaxial bone biopsy needle was advanced into the right iliac marrow space. Needle position was confirmed with CT imaging. Initially, bone marrow aspiration was performed. Next, the 11 gauge outer cannula was utilized to obtain a right iliac bone marrow core biopsy. Needle was removed. Hemostasis was obtained with compression. The patient tolerated the procedure well. Samples were prepared with the cytotechnologist. No  immediate complications.  IMPRESSION: CT guided right iliac bone marrow aspiration and core biopsy.  Signed,  Criselda Peaches, MD  [facility]  [facility]   Electronically Signed   By: Jacqulynn Cadet M.D.   On: 10/08/2014 18:06   Dg Abd Portable 1v  09/28/2014   CLINICAL DATA:  Bilateral double-J stent placement for hydronephrosis  EXAM: PORTABLE ABDOMEN - 1 VIEW  COMPARISON:  September 26, 2014 CT abdomen and pelvis and retrograde pyelogram study  FINDINGS: The left double-J stent loops proximally, likely in a rather markedly dilated collecting system based on recent studies. The tip of the left double-J stent is in the region of the bladder. The right double-J stent has its proximal aspect at L1-2 and distal aspect in the region of the bladder. There is a generalized paucity of bowel gas. No free air seen.  IMPRESSION: Double-J stent positions as described. The proximal right stent is in the normal flank position. The left stent proximally appears to reside in a dilated renal pelvis in a kidney this somewhat inferiorly positioned. Distally, both stents are felt to be within the urinary bladder.  There is a paucity of bowel gas pattern a paucity of gas may be seen normally but also may be seen with enteritis or ileus. Bowel obstruction not felt to be likely.   Electronically Signed   By: Lowella Grip III M.D.   On: 09/28/2014 11:07   US Abdomen Limited Ruq  10/14/2014   CLINICAL DATA:  Jaundice.  Previous cholecystectomy.  EXAM: US ABDOMEN LIMITED - RIGHT UPPER QUADRANT  COMPARISON:  Renal ultrasound dated 10/02/2014. Abdomen CT dated 05/14/2014.  FINDINGS: Gallbladder:  Surgically absent.  Common bile duct:  Diameter: 2.2 mm  Liver:  Ill-defined increased echogenicity which is mildly heterogeneous. Mildly lobulated liver contours. Changes of cirrhosis seen on the previous CT.  No free peritoneal fluid seen.  IMPRESSION: 1. No biliary obstruction. 2.  Changes of cirrhosis of the liver. 3. Status post cholecystectomy.   Electronically Signed   By: Claudie Revering M.D.   On: 10/14/2014 11:12    Microbiology: Recent Results (from the past 240 hour(s))  Urine culture     Status: None (Preliminary result)   Collection Time: 10/13/14  1:11 PM  Result Value Ref Range Status   Specimen Description URINE, CATHETERIZED  Final   Special Requests NONE  Final   Culture   Final    Culture reincubated for better growth Performed at [facility]    Report Status PENDING  Incomplete     Labs: Basic Metabolic Panel:  Recent Labs Lab 10/09/14 0606 10/10/14 0633 10/11/14 0645 10/13/14 1255 10/14/14 0625  NA 140 139 137 142 141  K 4.0 4.4 4.7 4.6 4.9  CL 108 108 105 108 108  CO2 '26 25 25 25 23  '$ GLUCOSE 116* 120* 110* 118* 87  BUN 69* 64* 67* 80* 85*  CREATININE 3.23* 2.94* 3.13* 3.97* 4.51*  CALCIUM 7.7* 7.6* 7.7* 8.0* 7.5*   Liver Function Tests:  Recent Labs Lab 10/13/14 1255 10/14/14 0625  AST 61* 69*  ALT 38 39  ALKPHOS 122 129*  BILITOT 5.2* 5.5*  PROT 6.7 6.3*  ALBUMIN 1.6* 1.5*    Recent Labs Lab 10/13/14 1255  LIPASE 47   No results for input(s): AMMONIA in the last 168 hours. CBC:  Recent Labs Lab 10/10/14 0633 10/13/14 1255 10/14/14 0625  WBC 8.6 9.0 15.4*  NEUTROABS 7.7 8.3*  --   HGB 9.2* 8.4* 8.1*  HCT 28.6* 25.4* 24.6*  MCV 90.8 90.4 90.1  PLT 31* 43* 43*   Cardiac Enzymes:  Recent Labs Lab 10/13/14 1255  TROPONINI <0.03   BNP: BNP (last 3 results) No results for input(s): BNP in the last 8760 hours.  ProBNP (last 3 results) No results for input(s): PROBNP in the last 8760 hours.  CBG:  Recent Labs Lab 10/10/14 2007 10/11/14 0005 10/11/14 0415 10/11/14 0808 10/11/14 1132  GLUCAP 127* 83 79 101* 119*       Signed:  Jamori Biggar M  [facility] November 08, 2014, 2:04 PM

## 2014-10-29 NOTE — Progress Notes (Signed)
Patient sounds congested when breathing, but unable to cough up sputum. Suction set up at bedside. Will continue to monitor.

## 2014-10-29 NOTE — Clinical Social Work Note (Signed)
Pt to transfer to [facility] today via [facility]. D/C summary faxed. Pt's wife and family aware and will follow. Additional support provided.  Benay Pike, [place]

## 2014-10-29 NOTE — Progress Notes (Signed)
NURSING PROGRESS NOTE  GREGG WINCHELL [id number] Discharge Data: 10-24-14 4:36 PM Attending Provider: No att. providers found [id number], Betsy Coder, MD   [name] to be D/C'd  To Hospice per MD order.    Chest port saline locked and left accessed for further pain management at hospice home.  All belongings returned to patient for patient to take home.  Patient left floor via stretcher, escorted by EMS.  Last Documented Vital Signs:  Blood pressure 94/59, pulse 133, temperature 99.4 F (37.4 C), temperature source Axillary, resp. rate 20, height '5\' 10"'$  (1.778 m), weight 97.8 kg (215 lb 9.8 oz), SpO2 94 %.  Cecilie Kicks D

## 2014-10-29 NOTE — Clinical Social Work Note (Signed)
Clinical Social Work Assessment  Patient Details  Name: [name] MRN: [id number] Date of Birth: 1951-05-18  Date of referral:  2014/10/29               Reason for consult:  End of Life/Hospice                Permission sought to share information with:    Permission granted to share information::     Name::        Agency::     Relationship::     Contact Information:     Housing/Transportation Living arrangements for the past 2 months:  Single Family Home Source of Information:  Spouse Patient Interpreter Needed:  None Criminal Activity/Legal Involvement Pertinent to Current Situation/Hospitalization:  No - Comment as needed Significant Relationships:  Spouse, Adult Children Lives with:  Spouse Do you feel safe going back to the place where you live?   (Family requesting Hospice Home) Need for family participation in patient care:  Yes (Comment)  Care giving concerns:  Pt lives with his wife and grandson.   Social worker assessment/plan: CSW met with pt's wife in hallway as many family members were in room. Pt sleeping at time of visit. Pt has metastatic colorectal cancer. His wife reports that he was first diagnosed about 8 years ago. 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 The couple have four children and three live locally. Pt's wife said that their family is very supportive and will be there to support her during this time. Their 84 year old grandson moved in with them a few years ago to help out during his treatment. She said that oncology prepared them yesterday that pt is end of life. They have agreed to comfort care. Pt's wife states that they are familiar with Hospice Home and would prefer for pt to transfer there if stable. She shared that pt did not want to die at home. Pt's wife became emotional as she said they have been married 2 years. CSW provided support to wife and agreed to follow up after discussing with MD regarding possible transfer to [place]  today.   Employment status:    Insurance information:  Medicare PT Recommendations:  Not assessed at this time Information / Referral to community resources:  Other (Comment Required) (Hospice Home)  Patient/Family's Response to care:  Pt's wife is agreeable to transfer to [place] if stable, but if not would request that pt remain in hospital.   Patient/Family's Understanding of and Emotional Response to Diagnosis, Current Treatment, and Prognosis:  Pt's wife appears to have understanding of pt's terminal diagnosis and very poor prognosis, with death expected within a few days. Support provided.   Emotional Assessment Appearance:  Other (Comment Required (Not assessed) Attitude/Demeanor/Rapport:  Unable to Assess Affect (typically observed):  Unable to Assess Orientation:    Alcohol / Substance use:  Not Applicable Psych involvement (Current and /or in the community):  No (Comment)  Discharge Needs  Concerns to be addressed:  Discharge Planning Concerns Readmission within the last 30 days:  Yes Current discharge risk:  Terminally ill Barriers to Discharge:  No Barriers Identified   Salome Arnt, [place] October 29, 2014, 11:42 AM [phone]

## 2014-10-29 NOTE — Care Management Note (Signed)
Case Management Note  Patient Details  Name: [name] MRN: [id number] Date of Birth: 07-Jun-1950  Subjective/Objective:                  Pt admitted from home with AKi and failure to thrive. Pt lives with his wife and is active with [facility]. Pt is actively dying and family has made pt comfort care. The recommendation for inpatient hospice has been given.  Action/Plan: CSW is aware of inpatient hospice referral. Pt is stable to discharge to [facility] today. CSW to arrange discharge to facility.  Expected Discharge Date:  10/17/14               Expected Discharge Plan:  [place]  In-House Referral:  Clinical Social Work  Discharge planning Services  CM Consult  Post Acute Care Choice:  Hospice Choice offered to:  Spouse  DME Arranged:    DME Agency:     HH Arranged:    [place] Agency:     Status of Service:  Completed, signed off  Medicare Important Message Given:  N/A - LOS <3 / Initial given by admissions Date Medicare IM Given:    Medicare IM give by:    Date Additional Medicare IM Given:    Additional Medicare Important Message give by:     If discussed at [place] of Stay Meetings, dates discussed:    Additional Comments:  Joylene Draft, RN Oct 23, 2014, 2:14 PM

## 2014-10-29 DEATH — deceased

## 2014-11-03 ENCOUNTER — Encounter (HOSPITAL_COMMUNITY): Payer: Managed Care, Other (non HMO)

## 2014-11-12 ENCOUNTER — Encounter (HOSPITAL_COMMUNITY): Payer: Managed Care, Other (non HMO)

## 2015-10-23 IMAGING — CT CT CHEST W/ CM
2 of 5 series · 13 of 36 positions shown, 16 images · IV contrast (Omnipaque 300)
Comparison: US RENAL dated 05/01/2013; CT ABD - PELV W/ CM dated
04/18/2013; CT ABD - PELV W/ CM dated 01/14/2013

CLINICAL DATA: Metastatic rectal carcinoma. Left flank pain.
Restaging.

EXAM:
CT CHEST, ABDOMEN, AND PELVIS WITH CONTRAST
TECHNIQUE: Multidetector CT imaging of the chest, abdomen and pelvis was
performed following the standard protocol during bolus
administration of intravenous contrast.
CONTRAST:  100mL OMNIPAQUE IOHEXOL 300 MG/ML  SOLN

[Series 2: cap with 5.0 b40f · axial · 0.86mm/px · z∈[-687,-132]mm · 10 of 127 slices shown, 13 images]
[im 8/127  mediastinal]
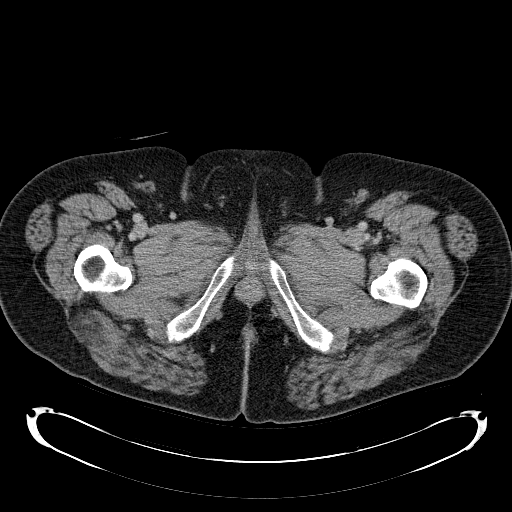
[im 8/127  lung]
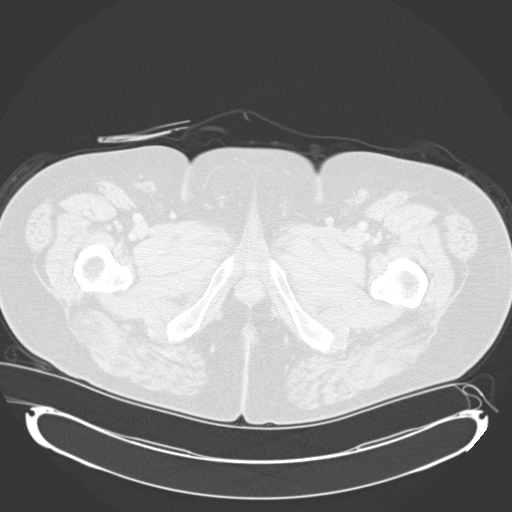
[im 24/127  lung]
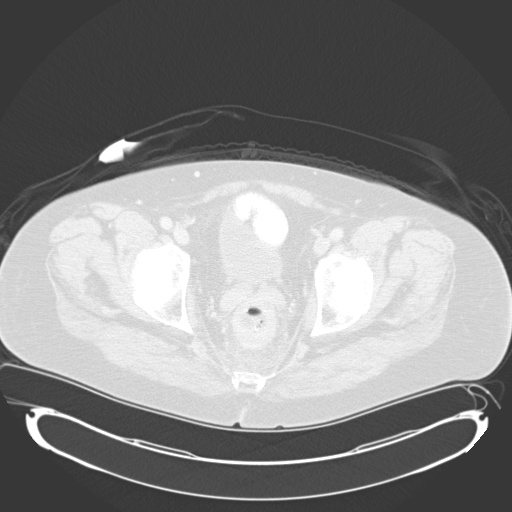
[im 32/127  lung]
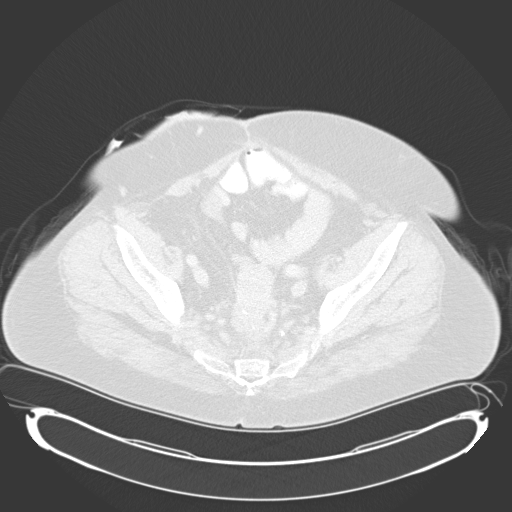
[im 48/127  lung]
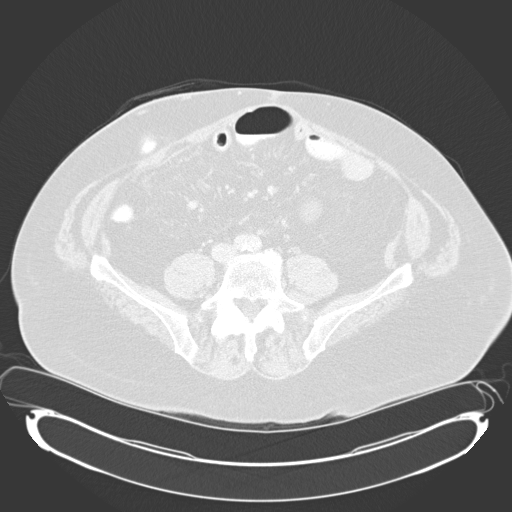
[im 56/127  mediastinal]
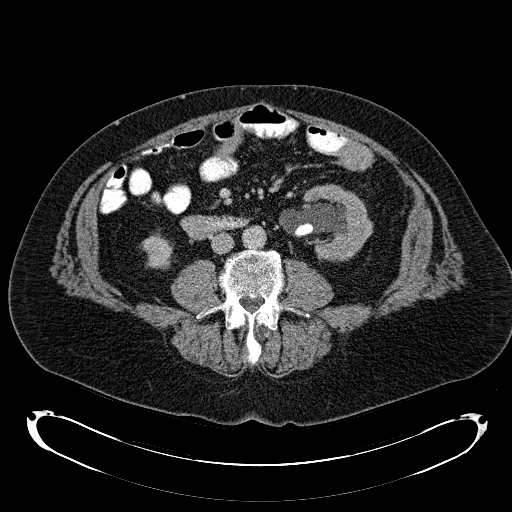
[im 56/127  lung]
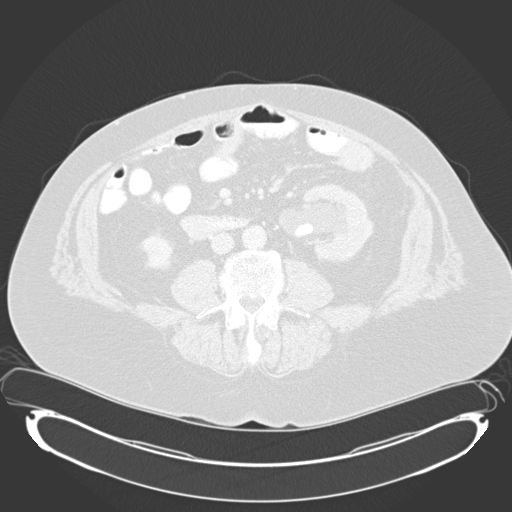
[im 71/127  lung]
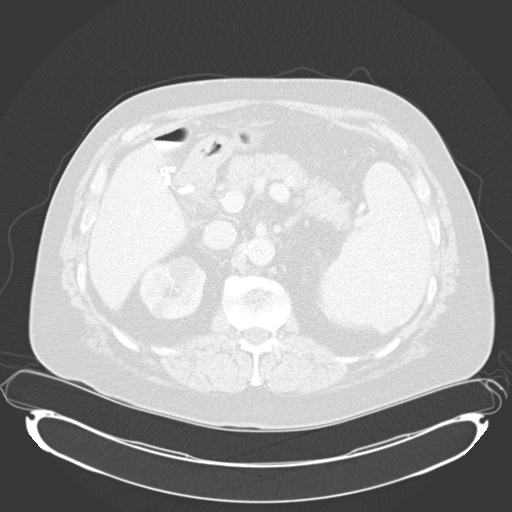
[im 79/127  lung]
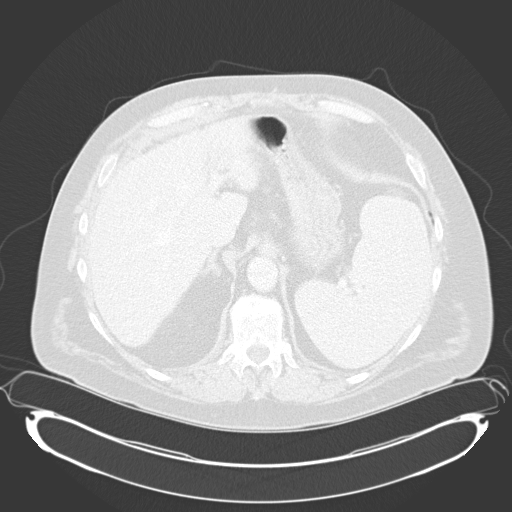
[im 95/127  lung]
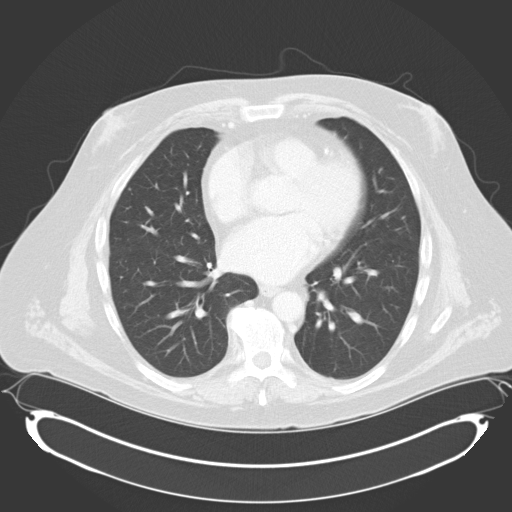
[im 103/127  mediastinal]
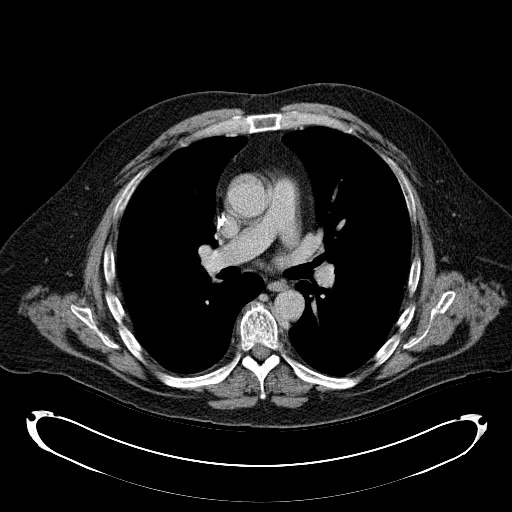
[im 103/127  lung]
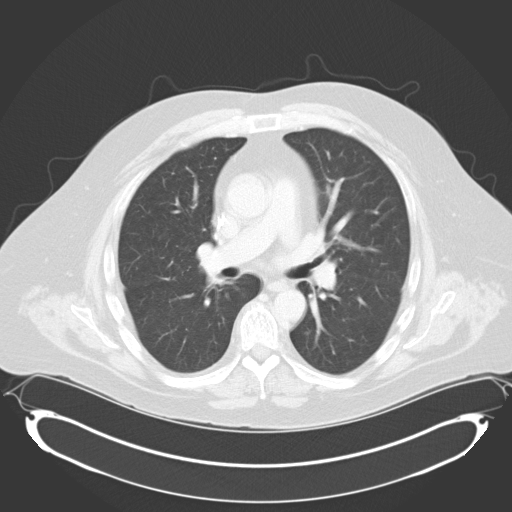
[im 119/127  lung]
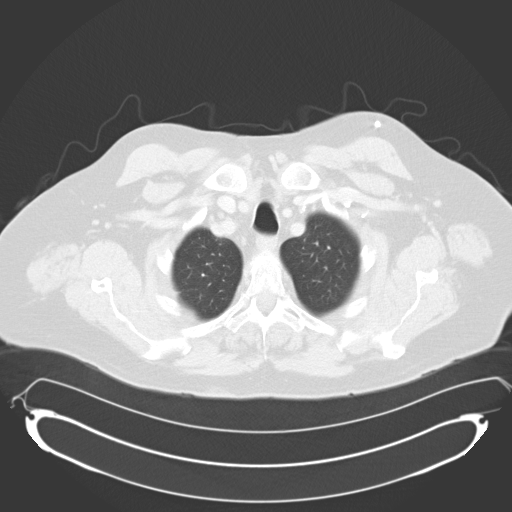

[Series 4: mpr cor post contrast (id) · coronal · 0.82mm/px · 3 of 99 slices shown]
[im 20/99  lung]
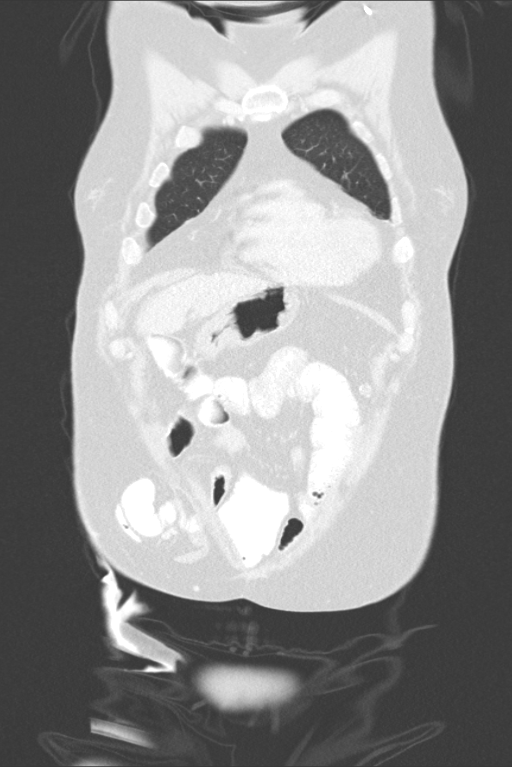
[im 40/99  lung]
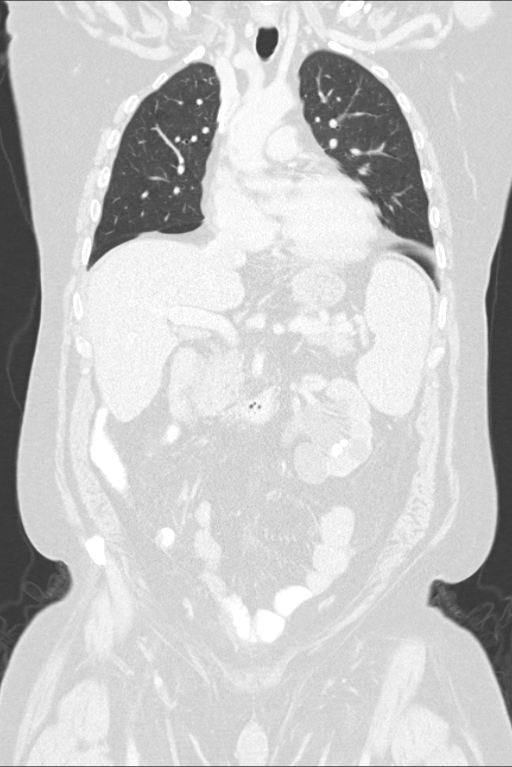
[im 59/99  lung]
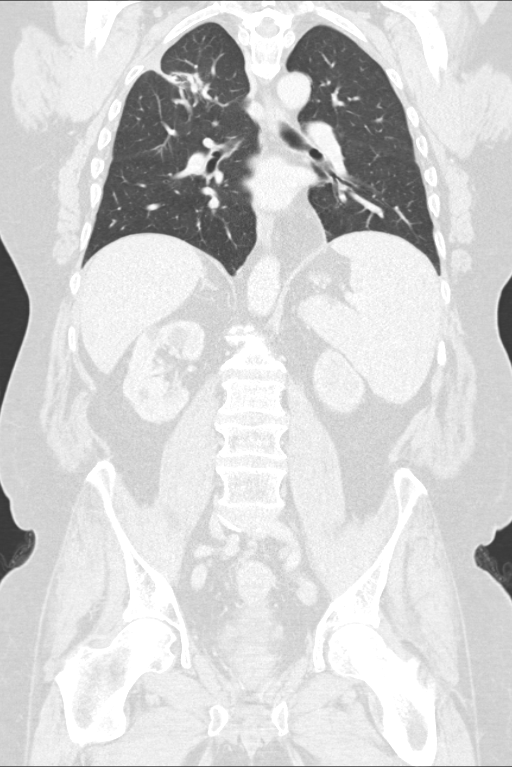

[13 of 36 positions shown; findings below may reference images not displayed]

FINDINGS: CT CHEST FINDINGS

Left subclavian Port-A-Cath tip appears unchanged at the SVC right
atrial junction. There are no enlarged mediastinal, hilar or
axillary lymph nodes. Sebaceous cyst in the upper right back is
unchanged.

There is stable mild atherosclerosis of the coronary arteries, aorta
and great vessels. There is no significant pleural or pericardial
effusion. The heart size is normal. Bilateral gynecomastia is noted.

There is stable scarring in the right upper lobe. The previously
demonstrated 5 mm nodule medially in the right upper lobe is smaller
and less well-defined on the axial images. No new or enlarging
pulmonary nodules identified.

There are no worrisome osseous findings.

CT ABDOMEN AND PELVIS FINDINGS

Mild contour irregularity of the liver appears stable. There are no
focal liver lesions. There is stable splenomegaly with a stable
possible infarct posteriorly in the spleen on image 55. The
gallbladder is surgically absent. There is no biliary dilatation.
The pancreas appears normal.

There is no adrenal mass. Multiple bilateral renal cysts are stable.
There are bilateral renal calculi with a 1.7 cm calculus in the left
renal pelvis. Dilatation of the left renal pelvis appears
progressive and there is delayed contrast excretion from the left
kidney. Both kidneys demonstrate mild cortical thinning.

The left ureter appears dilated into the pelvis where there is
increasing oval soft tissue adjacent to the rectal suture line. This
measures 3.9 x 3.0 cm on image 96 and is concerning for local
recurrence with resulting ureteral obstruction. Small perirectal
lymph nodes and perirectal soft tissue stranding are stable. The
bladder, prostate gland and seminal vesicles appear stable. There is
a limited fat plane between the suspected local recurrence and the
bladder, although no gross bladder invasion.

No ascites or other abdominal lymphadenopathy is seen. The stomach
and small bowel appear stable with parastomal herniation of small
bowel surrounding the right lower quadrant ileostomy

Chronic thoracolumbar compression deformities appear stable. There
are no worrisome osseous findings.
IMPRESSION: 1. Increased soft tissue density adjacent to the rectal suture line
worrisome for local recurrence of rectal adenocarcinoma. This is
supported by apparent partial obstruction of the distal left ureter.
2. No other signs of metastatic disease within the abdomen or
pelvis.
3. Previously demonstrated right upper lobe pulmonary nodule is less
well-defined and smaller, consistent with a treated lesion.
4. Stable bilateral renal calculi. Calculus in the left renal pelvis
does not appear to be obstructing the UPJ with the patient supine.
5. Stable parastomal herniation of small bowel surrounding the
ileostomy.
